# Patient Record
Sex: Male | Born: 1945 | Race: Black or African American | Hispanic: No | Marital: Single | Smoking: Former smoker
Health system: Southern US, Community
[De-identification: ages and names within clinical notes are randomized; demographics above are authoritative.]

## PROBLEM LIST (undated history)

## (undated) DIAGNOSIS — E119 Type 2 diabetes mellitus without complications: Secondary | ICD-10-CM

## (undated) DIAGNOSIS — I10 Essential (primary) hypertension: Secondary | ICD-10-CM

## (undated) DIAGNOSIS — M1990 Unspecified osteoarthritis, unspecified site: Secondary | ICD-10-CM

## (undated) DIAGNOSIS — C61 Malignant neoplasm of prostate: Secondary | ICD-10-CM

## (undated) DIAGNOSIS — K649 Unspecified hemorrhoids: Secondary | ICD-10-CM

## (undated) DIAGNOSIS — E7800 Pure hypercholesterolemia, unspecified: Secondary | ICD-10-CM

## (undated) HISTORY — PX: OTHER SURGICAL HISTORY: SHX169

---

## 1998-11-08 ENCOUNTER — Ambulatory Visit (HOSPITAL_COMMUNITY): Admission: RE | Admit: 1998-11-08 | Discharge: 1998-11-08 | Payer: Self-pay | Admitting: *Deleted

## 1999-07-18 ENCOUNTER — Encounter: Payer: Self-pay | Admitting: Endocrinology

## 1999-07-18 ENCOUNTER — Encounter: Admission: RE | Admit: 1999-07-18 | Discharge: 1999-07-18 | Payer: Self-pay | Admitting: Endocrinology

## 2001-09-10 ENCOUNTER — Ambulatory Visit (HOSPITAL_COMMUNITY): Admission: RE | Admit: 2001-09-10 | Discharge: 2001-09-10 | Payer: Self-pay | Admitting: *Deleted

## 2003-07-18 ENCOUNTER — Ambulatory Visit (HOSPITAL_COMMUNITY): Admission: RE | Admit: 2003-07-18 | Discharge: 2003-07-18 | Payer: Self-pay | Admitting: Orthopedic Surgery

## 2004-08-09 ENCOUNTER — Ambulatory Visit (HOSPITAL_BASED_OUTPATIENT_CLINIC_OR_DEPARTMENT_OTHER): Admission: RE | Admit: 2004-08-09 | Discharge: 2004-08-09 | Payer: Self-pay | Admitting: Surgery

## 2004-08-09 ENCOUNTER — Ambulatory Visit (HOSPITAL_COMMUNITY): Admission: RE | Admit: 2004-08-09 | Discharge: 2004-08-09 | Payer: Self-pay | Admitting: Surgery

## 2004-10-22 ENCOUNTER — Encounter: Admission: RE | Admit: 2004-10-22 | Discharge: 2005-01-20 | Payer: Self-pay | Admitting: Endocrinology

## 2006-11-28 ENCOUNTER — Ambulatory Visit (HOSPITAL_COMMUNITY): Admission: RE | Admit: 2006-11-28 | Discharge: 2006-11-28 | Payer: Self-pay | Admitting: *Deleted

## 2012-04-22 ENCOUNTER — Other Ambulatory Visit: Payer: Self-pay | Admitting: Urology

## 2012-05-26 ENCOUNTER — Encounter (HOSPITAL_COMMUNITY): Payer: Self-pay | Admitting: Pharmacy Technician

## 2012-05-29 ENCOUNTER — Ambulatory Visit (HOSPITAL_COMMUNITY)
Admission: RE | Admit: 2012-05-29 | Discharge: 2012-05-29 | Disposition: A | Discharge status: Home / Self Care | Payer: Medicare Other | Source: Ambulatory Visit | Attending: Urology | Admitting: Urology

## 2012-05-29 ENCOUNTER — Encounter (HOSPITAL_COMMUNITY)
Admission: RE | Admit: 2012-05-29 | Discharge: 2012-05-29 | Disposition: A | Discharge status: Home / Self Care | Payer: Medicare Other | Source: Ambulatory Visit | Attending: Urology | Admitting: Urology

## 2012-05-29 ENCOUNTER — Encounter (HOSPITAL_COMMUNITY): Payer: Self-pay

## 2012-05-29 ENCOUNTER — Other Ambulatory Visit: Payer: Self-pay

## 2012-05-29 DIAGNOSIS — Z01818 Encounter for other preprocedural examination: Secondary | ICD-10-CM | POA: Insufficient documentation

## 2012-05-29 HISTORY — DX: Unspecified hemorrhoids: K64.9

## 2012-05-29 HISTORY — DX: Unspecified osteoarthritis, unspecified site: M19.90

## 2012-05-29 HISTORY — DX: Type 2 diabetes mellitus without complications: E11.9

## 2012-05-29 HISTORY — DX: Pure hypercholesterolemia, unspecified: E78.00

## 2012-05-29 HISTORY — DX: Essential (primary) hypertension: I10

## 2012-05-29 LAB — CBC
HCT: 40.1 % (ref 39.0–52.0)
RBC: 5.02 MIL/uL (ref 4.22–5.81)
RDW: 14 % (ref 11.5–15.5)
WBC: 6.7 10*3/uL (ref 4.0–10.5)

## 2012-05-29 LAB — BASIC METABOLIC PANEL
Chloride: 98 mEq/L (ref 96–112)
Creatinine, Ser: 0.69 mg/dL (ref 0.50–1.35)
GFR calc Af Amer: >90 mL/min (ref >90)
Potassium: 3.8 mEq/L (ref 3.5–5.1)
Sodium: 134 mEq/L — ABNORMAL LOW (ref 135–145)

## 2012-05-29 LAB — SURGICAL PCR SCREEN
MRSA, PCR: NEGATIVE
Staphylococcus aureus: NEGATIVE

## 2012-05-29 NOTE — Progress Notes (Signed)
05/29/12 1314  OBSTRUCTIVE SLEEP APNEA  Have you ever been diagnosed with sleep apnea through a sleep study? No  Do you snore loudly (loud enough to be heard through closed doors)?  0  Do you often feel tired, fatigued, or sleepy during the daytime? 1  Has anyone observed you stop breathing during your sleep? 0  Do you have, or are you being treated for high blood pressure? 1  BMI more than 35 kg/m2? 1  Age over 65 years old? 1  Gender: 1  Obstructive Sleep Apnea Score 5   Score 4 or greater  Results sent to PCP

## 2012-05-29 NOTE — Patient Instructions (Addendum)
20 Jay Holloway  05/29/2012   Your procedure is scheduled on:  06-04-2012   Report to [facility] at 0830  AM.  Call this number if you have problems the morning of surgery: [phone]   Remember:   Do not eat food after midnight Tuesday night   clear liquids all day Wednesday 06-03-2012. No liquids after midnight Wednesday night.  .  Take these medicines the morning of surgery with A SIP OF WATER: crestor   Do not wear jewelry or make up.  Do not wear lotions, powders, or perfumes. You may wear deodorant.    Do not bring valuables to the hospital.  Contacts, dentures or bridgework may not be worn into surgery.  Leave suitcase in the car. After surgery it may be brought to your room.  For patients admitted to the hospital, checkout time is 11:00 AM the day of discharge                             Patients discharged the day of surgery will not be allowed to drive home. If going home same day of surgery, you must have someone stay with you the first 24 hours at home and arrange for some one to drive you home from hospital.    Special Instructions: See [facility] Preparing for Surgery instruction sheet. Women do not shave legs or underarms for 12 hours before showers. Men may shave face morning of surgery.    Please read over the following fact sheets that you were given: MRSA Information, incentive spirometer fact sheet, clear liquid fact sheet  Cain Sieve [facility] pre op nurse phone number [phone], call if needed

## 2012-06-01 NOTE — Progress Notes (Signed)
bmet results faxed to dr borden, fax confirnation received and placed on pt chart.

## 2012-06-03 NOTE — H&P (Signed)
Chief Complaint  Prostate Cancer     History of Present Illness  Jay Holloway is a 66 year old with a long history of an elevated PSA dating back to 83 when he reportedly underwent a prostate biopsy which indicated evidence of BPH and inflammation but no malignancy. His PSA increased to 4.6 in June 2013 prompting further evaluation and a prostate biopsy on 03/31/12 which confirmed Gleason 4+3=7 adenocarcinoma in 1 out of 12 biopsy cores.  He has no family history of prostate cancer.  His comorbidities include a history of diabetes, hypertension, and morbid obesity (315 lbs).  TNM stage: cT1c Nx Mx PSA: 4.6 Gleason score: 4+3=7 Biopsy (03/31/12): 1/12 cores positive -- R lateral base (70%) Prostate volume: 33.3 cc  Nomogram: OC disease: 75% EPE: 21% SVI: 3% LNI: 2.2% PFS (surgery): 94%, 91%  Urinary function: He has minimal voiding symptoms. IPSS is 4.  Erectile function: He has erectile dysfunction. SHIM score is 20. However, he states that he can only get erections if he waits approximately a week in between erections and with significant effort. He currently is not using PDE 5 inhibitors.   Past Medical History Problems  1. History of  Arthritis V13.4 2. History of  Diabetes Mellitus 250.00 3. History of  Hypercholesterolemia 272.0 4. History of  Hypertension 401.9 5. History of  Nephrolithiasis V13.01  Surgical History Problems  1. History of  Ankle Surgery Right 2. History of  Biopsy Of The Prostate Needle  Current Meds 1. Crestor 10 MG Oral Tablet; Therapy: 28Jun2012 to 2. Exforge 10-320 MG Oral Tablet; Therapy: 28Jun2012 to 3. Indomethacin ER 75 MG Oral Capsule Extended Release; Therapy: 28Jun2012 to 4. MetFORMIN HCl 500 MG Oral Tablet; Therapy: (Recorded:01Aug2013) to  Allergies Medication  1. No Known Drug Allergies  Family History Problems  1. Fraternal history of  Renal Failure Denied  2. Family history of  Prostate Cancer  Social History Problems     Alcohol Use 1-2/wk   Never A Smoker  Review of Systems Constitutional, skin, eye, otolaryngeal, hematologic/lymphatic, cardiovascular, pulmonary, endocrine, musculoskeletal, gastrointestinal, neurological and psychiatric system(s) were reviewed and pertinent findings if present are noted.  Cardiovascular: no chest pain and no leg swelling.  Respiratory: no shortness of breath during exertion.    Vitals  BMI Calculated: 40.43 BSA Calculated: 2.64 Height: 6 ft 2 in Weight: 315 lb    Physical Exam Constitutional: Well nourished and well developed . No acute distress.  ENT:. The ears and nose are normal in appearance.  Neck: The appearance of the neck is normal and no neck mass is present.  Pulmonary: No respiratory distress, normal respiratory rhythm and effort and clear bilateral breath sounds.  Cardiovascular: Heart rate and rhythm are normal . No peripheral edema.  Abdomen: The abdomen is obese. The abdomen is soft and nontender. No masses are palpated. No CVA tenderness. No hernias are palpable. No hepatosplenomegaly noted. He is morbidly obese and does have a pannus.  Rectal: Rectal exam demonstrates normal sphincter tone, no tenderness and no masses. Prostate size is estimated to be 40 g. The prostate has no nodularity and is not tender. The left seminal vesicle is nonpalpable. The right seminal vesicle is nonpalpable. The perineum is normal on inspection.  Lymphatics: The femoral and inguinal nodes are not enlarged or tender.  Skin: Normal skin turgor, no visible rash and no visible skin lesions.  Neuro/Psych:. Mood and affect are appropriate.    Results/Data Urine [Data Includes: Last 1 Day]   24Sep2013  COLOR YELLOW   APPEARANCE CLEAR   SPECIFIC GRAVITY 1.025   pH 6.0   GLUCOSE > 1000 mg/dL  BILIRUBIN NEG   KETONE > 80 mg/dL  BLOOD LARGE   PROTEIN 30 mg/dL  UROBILINOGEN 0.2 mg/dL  NITRITE NEG   LEUKOCYTE ESTERASE NEG   SQUAMOUS EPITHELIAL/HPF RARE   WBC 0-2 WBC/hpf   RBC 11-20 RBC/hpf  BACTERIA RARE   CRYSTALS NONE SEEN   CASTS NONE SEEN     I reviewed his medical records, PSA results, and pathology report. Findings are as dictated above.   Assessment Assessed  1. Adenocarcinoma Of The Prostate Gland 185    Discussion/Summary  1. Prostate cancer:   We discussed his prostate cancer and treatment options that were available including both surgical treatment and radiation treatment options in detail. I did recommend proceeding with treatment with curative intent. I also offered him a radiation oncology consultation which he declines. He understands the increased risks associated with surgery including the possibility that he may require open surgical conversion based on his abdominal girth and potential inability to ventilate him well in the Trendelenburg position. He also understands the risks associated with wound healing/infection considering his history of diabetes and the potential increased risk of urinary incontinence considering his abdominal size. Despite these increased risks, and he adamantly wishes to proceed with surgical treatment of his prostate cancer.  He will be scheduled for a bilateral nerve sparing robotic prostatectomy and pelvic lymphadenectomy. He understands the increased risk of open surgical conversion in the potential implications of this.

## 2012-06-04 ENCOUNTER — Encounter (HOSPITAL_COMMUNITY): Payer: Self-pay | Admitting: Anesthesiology

## 2012-06-04 ENCOUNTER — Observation Stay (HOSPITAL_COMMUNITY)
Admission: RE | Admit: 2012-06-04 | Discharge: 2012-06-05 | Disposition: A | Discharge status: Home / Self Care | Payer: Medicare Other | Source: Ambulatory Visit | Attending: Urology | Admitting: Urology

## 2012-06-04 ENCOUNTER — Encounter (HOSPITAL_COMMUNITY)
Admission: RE | Disposition: A | Discharge status: Home / Self Care | Payer: Self-pay | Source: Ambulatory Visit | Attending: Urology

## 2012-06-04 ENCOUNTER — Ambulatory Visit (HOSPITAL_COMMUNITY): Payer: Medicare Other | Admitting: Anesthesiology

## 2012-06-04 ENCOUNTER — Encounter (HOSPITAL_COMMUNITY): Payer: Self-pay | Admitting: *Deleted

## 2012-06-04 DIAGNOSIS — I10 Essential (primary) hypertension: Secondary | ICD-10-CM | POA: Insufficient documentation

## 2012-06-04 DIAGNOSIS — Z79899 Other long term (current) drug therapy: Secondary | ICD-10-CM | POA: Insufficient documentation

## 2012-06-04 DIAGNOSIS — E119 Type 2 diabetes mellitus without complications: Secondary | ICD-10-CM | POA: Insufficient documentation

## 2012-06-04 DIAGNOSIS — C61 Malignant neoplasm of prostate: Principal | ICD-10-CM | POA: Insufficient documentation

## 2012-06-04 DIAGNOSIS — Z23 Encounter for immunization: Secondary | ICD-10-CM | POA: Insufficient documentation

## 2012-06-04 DIAGNOSIS — E7800 Pure hypercholesterolemia, unspecified: Secondary | ICD-10-CM | POA: Insufficient documentation

## 2012-06-04 HISTORY — PX: LYMPHADENECTOMY: SHX5960

## 2012-06-04 HISTORY — PX: ROBOT ASSISTED LAPAROSCOPIC RADICAL PROSTATECTOMY: SHX5141

## 2012-06-04 LAB — ABO/RH: ABO/RH(D): O POS

## 2012-06-04 LAB — TYPE AND SCREEN: Antibody Screen: NEGATIVE

## 2012-06-04 LAB — GLUCOSE, CAPILLARY
Glucose-Capillary: 260 mg/dL — ABNORMAL HIGH (ref 70–99)
Glucose-Capillary: 284 mg/dL — ABNORMAL HIGH (ref 70–99)
Glucose-Capillary: 313 mg/dL — ABNORMAL HIGH (ref 70–99)

## 2012-06-04 LAB — HEMOGLOBIN AND HEMATOCRIT, BLOOD: HCT: 36.4 % — ABNORMAL LOW (ref 39.0–52.0)

## 2012-06-04 SURGERY — ROBOTIC ASSISTED LAPAROSCOPIC RADICAL PROSTATECTOMY LEVEL 3
Anesthesia: General | Wound class: Clean Contaminated

## 2012-06-04 MED ORDER — HYDROMORPHONE HCL PF 1 MG/ML IJ SOLN
0.2500 mg | INTRAMUSCULAR | Status: DC | PRN
  Administered 2012-06-04 (×2): 0.25 mg via INTRAVENOUS

## 2012-06-04 MED ORDER — KCL IN DEXTROSE-NACL 20-5-0.45 MEQ/L-%-% IV SOLN
INTRAVENOUS | Status: DC
  Administered 2012-06-04 – 2012-06-05 (×2): via INTRAVENOUS
  Filled 2012-06-04 (×4): qty 1000

## 2012-06-04 MED ORDER — MIDAZOLAM HCL 5 MG/5ML IJ SOLN
INTRAMUSCULAR | Status: DC | PRN
  Administered 2012-06-04: 2 mg via INTRAVENOUS

## 2012-06-04 MED ORDER — STERILE WATER FOR IRRIGATION IR SOLN
Status: DC | PRN
  Administered 2012-06-04: 3000 mL

## 2012-06-04 MED ORDER — ACETAMINOPHEN 10 MG/ML IV SOLN
INTRAVENOUS | Status: AC
  Filled 2012-06-04: qty 100

## 2012-06-04 MED ORDER — INFLUENZA VIRUS VACC SPLIT PF IM SUSP
0.5000 mL | INTRAMUSCULAR | Status: AC
  Administered 2012-06-05: 0.5 mL via INTRAMUSCULAR
  Filled 2012-06-04: qty 0.5

## 2012-06-04 MED ORDER — PROPOFOL 10 MG/ML IV BOLUS
INTRAVENOUS | Status: DC | PRN
  Administered 2012-06-04: 280 mg via INTRAVENOUS

## 2012-06-04 MED ORDER — CEFAZOLIN SODIUM-DEXTROSE 2-3 GM-% IV SOLR
INTRAVENOUS | Status: AC
  Filled 2012-06-04: qty 50

## 2012-06-04 MED ORDER — BUPIVACAINE-EPINEPHRINE 0.25% -1:200000 IJ SOLN
INTRAMUSCULAR | Status: DC | PRN
  Administered 2012-06-04: 30 mL

## 2012-06-04 MED ORDER — IRBESARTAN 300 MG PO TABS
300.0000 mg | ORAL_TABLET | Freq: Every day | ORAL | Status: DC
  Filled 2012-06-04: qty 1

## 2012-06-04 MED ORDER — NEOSTIGMINE METHYLSULFATE 1 MG/ML IJ SOLN
INTRAMUSCULAR | Status: DC | PRN
  Administered 2012-06-04: 5 mg via INTRAVENOUS

## 2012-06-04 MED ORDER — LACTATED RINGERS IV SOLN
INTRAVENOUS | Status: DC | PRN
  Administered 2012-06-04: 12:00:00

## 2012-06-04 MED ORDER — GLYCOPYRROLATE 0.2 MG/ML IJ SOLN
INTRAMUSCULAR | Status: DC | PRN
  Administered 2012-06-04: 1 mg via INTRAVENOUS

## 2012-06-04 MED ORDER — SODIUM CHLORIDE 0.9 % IV BOLUS (SEPSIS)
500.0000 mL | Freq: Once | INTRAVENOUS | Status: AC
  Administered 2012-06-04: 500 mL via INTRAVENOUS

## 2012-06-04 MED ORDER — AMLODIPINE BESYLATE-VALSARTAN 10-320 MG PO TABS: 1.0000 | ORAL_TABLET | Freq: Every morning | ORAL | Status: DC

## 2012-06-04 MED ORDER — INDIGOTINDISULFONATE SODIUM 8 MG/ML IJ SOLN
INTRAMUSCULAR | Status: AC
  Filled 2012-06-04: qty 10

## 2012-06-04 MED ORDER — HEPARIN SODIUM (PORCINE) 1000 UNIT/ML IJ SOLN
INTRAMUSCULAR | Status: AC
  Filled 2012-06-04: qty 1

## 2012-06-04 MED ORDER — MORPHINE SULFATE 2 MG/ML IJ SOLN
2.0000 mg | INTRAMUSCULAR | Status: DC | PRN
  Administered 2012-06-04 – 2012-06-05 (×4): 2 mg via INTRAVENOUS
  Filled 2012-06-04 (×4): qty 1

## 2012-06-04 MED ORDER — BUPIVACAINE-EPINEPHRINE PF 0.25-1:200000 % IJ SOLN
INTRAMUSCULAR | Status: AC
  Filled 2012-06-04: qty 30

## 2012-06-04 MED ORDER — PHENYLEPHRINE HCL 10 MG/ML IJ SOLN
INTRAMUSCULAR | Status: DC | PRN
  Administered 2012-06-04 (×3): 100 ug via INTRAVENOUS

## 2012-06-04 MED ORDER — ACETAMINOPHEN 10 MG/ML IV SOLN
INTRAVENOUS | Status: DC | PRN
  Administered 2012-06-04: 1000 mg via INTRAVENOUS

## 2012-06-04 MED ORDER — MENTHOL 3 MG MT LOZG
1.0000 | LOZENGE | OROMUCOSAL | Status: DC | PRN
  Filled 2012-06-04: qty 9

## 2012-06-04 MED ORDER — KETOROLAC TROMETHAMINE 15 MG/ML IJ SOLN
15.0000 mg | Freq: Four times a day (QID) | INTRAMUSCULAR | Status: DC
  Administered 2012-06-04 – 2012-06-05 (×3): 15 mg via INTRAVENOUS
  Filled 2012-06-04 (×6): qty 1

## 2012-06-04 MED ORDER — AMLODIPINE BESYLATE 10 MG PO TABS
10.0000 mg | ORAL_TABLET | Freq: Every day | ORAL | Status: DC
  Filled 2012-06-04: qty 1

## 2012-06-04 MED ORDER — SODIUM CHLORIDE 0.9 % IV SOLN
10.0000 mg | INTRAVENOUS | Status: DC | PRN
  Administered 2012-06-04: 50 ug/min via INTRAVENOUS

## 2012-06-04 MED ORDER — ROCURONIUM BROMIDE 100 MG/10ML IV SOLN
INTRAVENOUS | Status: DC | PRN
  Administered 2012-06-04: 60 mg via INTRAVENOUS
  Administered 2012-06-04: 10 mg via INTRAVENOUS
  Administered 2012-06-04: 20 mg via INTRAVENOUS
  Administered 2012-06-04 (×3): 10 mg via INTRAVENOUS

## 2012-06-04 MED ORDER — EPHEDRINE SULFATE 50 MG/ML IJ SOLN
INTRAMUSCULAR | Status: DC | PRN
  Administered 2012-06-04: 10 mg via INTRAVENOUS
  Administered 2012-06-04: 5 mg via INTRAVENOUS
  Administered 2012-06-04: 10 mg via INTRAVENOUS

## 2012-06-04 MED ORDER — INDIGOTINDISULFONATE SODIUM 8 MG/ML IJ SOLN
INTRAMUSCULAR | Status: DC | PRN
  Administered 2012-06-04 (×2): 5 mL via INTRAVENOUS

## 2012-06-04 MED ORDER — ATORVASTATIN CALCIUM 20 MG PO TABS
20.0000 mg | ORAL_TABLET | Freq: Every day | ORAL | Status: DC
Start: 2012-06-04 — End: 2012-06-05
  Administered 2012-06-04: 20 mg via ORAL
  Filled 2012-06-04 (×2): qty 1

## 2012-06-04 MED ORDER — LIDOCAINE HCL (CARDIAC) 20 MG/ML IV SOLN
INTRAVENOUS | Status: DC | PRN
  Administered 2012-06-04: 100 mg via INTRAVENOUS

## 2012-06-04 MED ORDER — INSULIN ASPART 100 UNIT/ML ~~LOC~~ SOLN
0.0000 [IU] | SUBCUTANEOUS | Status: DC
  Administered 2012-06-04: 8 [IU] via SUBCUTANEOUS
  Administered 2012-06-04 – 2012-06-05 (×2): 11 [IU] via SUBCUTANEOUS
  Administered 2012-06-05: 8 [IU] via SUBCUTANEOUS
  Administered 2012-06-05: 3 [IU] via SUBCUTANEOUS

## 2012-06-04 MED ORDER — HYDROMORPHONE HCL PF 1 MG/ML IJ SOLN
INTRAMUSCULAR | Status: AC
  Filled 2012-06-04: qty 1

## 2012-06-04 MED ORDER — DIPHENHYDRAMINE HCL 12.5 MG/5ML PO ELIX: 12.5000 mg | ORAL_SOLUTION | Freq: Four times a day (QID) | ORAL | Status: DC | PRN

## 2012-06-04 MED ORDER — LACTATED RINGERS IV SOLN
INTRAVENOUS | Status: DC
  Administered 2012-06-04: 1000 mL via INTRAVENOUS
  Administered 2012-06-04 (×2): via INTRAVENOUS

## 2012-06-04 MED ORDER — HYDROCODONE-ACETAMINOPHEN 5-325 MG PO TABS: 1.0000 | ORAL_TABLET | Freq: Four times a day (QID) | ORAL | Status: DC | PRN

## 2012-06-04 MED ORDER — FENTANYL CITRATE 0.05 MG/ML IJ SOLN
INTRAMUSCULAR | Status: DC | PRN
  Administered 2012-06-04: 50 ug via INTRAVENOUS
  Administered 2012-06-04: 100 ug via INTRAVENOUS
  Administered 2012-06-04 (×2): 50 ug via INTRAVENOUS

## 2012-06-04 MED ORDER — ACETAMINOPHEN 325 MG PO TABS: 650.0000 mg | ORAL_TABLET | ORAL | Status: DC | PRN

## 2012-06-04 MED ORDER — LACTATED RINGERS IV SOLN: INTRAVENOUS | Status: DC

## 2012-06-04 MED ORDER — CEFAZOLIN SODIUM 1-5 GM-% IV SOLN
INTRAVENOUS | Status: AC
  Filled 2012-06-04: qty 50

## 2012-06-04 MED ORDER — CIPROFLOXACIN HCL 500 MG PO TABS: 500.0000 mg | ORAL_TABLET | Freq: Two times a day (BID) | ORAL | Status: DC

## 2012-06-04 MED ORDER — CEFAZOLIN SODIUM 1-5 GM-% IV SOLN
1.0000 g | Freq: Three times a day (TID) | INTRAVENOUS | Status: AC
  Administered 2012-06-04 – 2012-06-05 (×2): 1 g via INTRAVENOUS
  Filled 2012-06-04 (×2): qty 50

## 2012-06-04 MED ORDER — SODIUM CHLORIDE 0.9 % IV BOLUS (SEPSIS)
1000.0000 mL | Freq: Once | INTRAVENOUS | Status: AC
  Administered 2012-06-04: 1000 mL via INTRAVENOUS

## 2012-06-04 MED ORDER — AMLODIPINE BESYLATE 10 MG PO TABS
10.0000 mg | ORAL_TABLET | Freq: Once | ORAL | Status: AC
  Administered 2012-06-04: 10 mg via ORAL
  Filled 2012-06-04: qty 1

## 2012-06-04 MED ORDER — SODIUM CHLORIDE 0.9 % IR SOLN
Status: DC | PRN
  Administered 2012-06-04: 300 mL via INTRAVESICAL

## 2012-06-04 MED ORDER — DIPHENHYDRAMINE HCL 50 MG/ML IJ SOLN: 12.5000 mg | Freq: Four times a day (QID) | INTRAMUSCULAR | Status: DC | PRN

## 2012-06-04 MED ORDER — ONDANSETRON HCL 4 MG/2ML IJ SOLN
INTRAMUSCULAR | Status: DC | PRN
  Administered 2012-06-04: 4 mg via INTRAVENOUS

## 2012-06-04 MED ORDER — DOCUSATE SODIUM 100 MG PO CAPS
100.0000 mg | ORAL_CAPSULE | Freq: Two times a day (BID) | ORAL | Status: DC
  Administered 2012-06-04 – 2012-06-05 (×2): 100 mg via ORAL
  Filled 2012-06-04 (×3): qty 1

## 2012-06-04 MED ORDER — DEXTROSE 5 % IV SOLN
3.0000 g | INTRAVENOUS | Status: AC
  Administered 2012-06-04: 3 g via INTRAVENOUS

## 2012-06-04 SURGICAL SUPPLY — 39 items
CANISTER SUCTION 2500CC (MISCELLANEOUS) ×3 IMPLANT
CATH FOLEY 2WAY SLVR 18FR 30CC (CATHETERS) ×3 IMPLANT
CATH ROBINSON RED A/P 8FR (CATHETERS) ×3 IMPLANT
CATH TIEMANN FOLEY 18FR 5CC (CATHETERS) ×3 IMPLANT
CHLORAPREP W/TINT 26ML (MISCELLANEOUS) ×3 IMPLANT
CLIP LIGATING HEM O LOK PURPLE (MISCELLANEOUS) ×5 IMPLANT
CLOTH BEACON ORANGE TIMEOUT ST (SAFETY) ×3 IMPLANT
COVER SURGICAL LIGHT HANDLE (MISCELLANEOUS) ×3 IMPLANT
COVER TIP SHEARS 8 DVNC (MISCELLANEOUS) ×2 IMPLANT
COVER TIP SHEARS 8MM DA VINCI (MISCELLANEOUS) ×1
CUTTER ECHEON FLEX ENDO 45 340 (ENDOMECHANICALS) ×3 IMPLANT
DECANTER SPIKE VIAL GLASS SM (MISCELLANEOUS) ×3 IMPLANT
DRAPE SURG IRRIG POUCH 19X23 (DRAPES) ×3 IMPLANT
DRSG TEGADERM 2-3/8X2-3/4 SM (GAUZE/BANDAGES/DRESSINGS) ×15 IMPLANT
DRSG TEGADERM 4X4.75 (GAUZE/BANDAGES/DRESSINGS) ×3 IMPLANT
DRSG TEGADERM 6X8 (GAUZE/BANDAGES/DRESSINGS) ×6 IMPLANT
ELECT REM PT RETURN 9FT ADLT (ELECTROSURGICAL) ×3
ELECTRODE REM PT RTRN 9FT ADLT (ELECTROSURGICAL) ×2 IMPLANT
GLOVE BIO SURGEON STRL SZ 6.5 (GLOVE) ×3 IMPLANT
GLOVE BIOGEL M STRL SZ7.5 (GLOVE) ×6 IMPLANT
GOWN STRL NON-REIN LRG LVL3 (GOWN DISPOSABLE) ×21 IMPLANT
HOLDER FOLEY CATH W/STRAP (MISCELLANEOUS) ×3 IMPLANT
IV LACTATED RINGERS 1000ML (IV SOLUTION) ×2 IMPLANT
KIT ACCESSORY DA VINCI DISP (KITS) ×1
KIT ACCESSORY DVNC DISP (KITS) ×2 IMPLANT
NDL SAFETY ECLIPSE 18X1.5 (NEEDLE) ×2 IMPLANT
NEEDLE HYPO 18GX1.5 SHARP (NEEDLE) ×3
PACK ROBOT UROLOGY CUSTOM (CUSTOM PROCEDURE TRAY) ×3 IMPLANT
RELOAD GREEN ECHELON 45 (STAPLE) ×3 IMPLANT
SET TUBE IRRIG SUCTION NO TIP (IRRIGATION / IRRIGATOR) ×4 IMPLANT
SOLUTION ELECTROLUBE (MISCELLANEOUS) ×3 IMPLANT
SPONGE GAUZE 4X4 12PLY (GAUZE/BANDAGES/DRESSINGS) ×3 IMPLANT
SUT ETHILON 3 0 PS 1 (SUTURE) ×3 IMPLANT
SUT VICRYL 0 UR6 27IN ABS (SUTURE) ×6 IMPLANT
SYR 27GX1/2 1ML LL SAFETY (SYRINGE) ×3 IMPLANT
TOWEL OR 17X26 10 PK STRL BLUE (TOWEL DISPOSABLE) ×3 IMPLANT
TOWEL OR NON WOVEN STRL DISP B (DISPOSABLE) ×3 IMPLANT
TROCAR 12M 150ML BLUNT (TROCAR) ×1 IMPLANT
WATER STERILE IRR 1500ML POUR (IV SOLUTION) ×5 IMPLANT

## 2012-06-04 NOTE — Transfer of Care (Addendum)
Immediate Anesthesia Transfer of Care Note  Patient: Jay Holloway  Procedure(s) Performed: Procedure(s) (LRB) with comments: ROBOTIC ASSISTED LAPAROSCOPIC RADICAL PROSTATECTOMY LEVEL 3 (N/A) LYMPHADENECTOMY (Bilateral)  Patient Location: PACU  Anesthesia Type:General  Level of Consciousness: pateint uncooperative and confused  Airway & Oxygen Therapy: Patient connected to face mask oxygen  Post-op Assessment: Report given to PACU RN  Post vital signs: Reviewed and stable  Complications: upon arrival, breatihing shallow.  resp assisted with ambu bag.  NPA place in L nare.  O2Sat 91% on arrival.  Increases to 98%  Sleepy but arrousable.

## 2012-06-04 NOTE — Anesthesia Preprocedure Evaluation (Signed)
Anesthesia Evaluation  Patient identified by MRN, date of birth, ID band Patient awake    Reviewed: Allergy & Precautions, H&P , NPO status , Patient's Chart, lab work & pertinent test results  Airway Mallampati: II TM Distance: >3 FB Neck ROM: full    Dental No notable dental hx. (+) Missing and Dental Advisory Given,    Pulmonary neg pulmonary ROS,  breath sounds clear to auscultation  Pulmonary exam normal       Cardiovascular Exercise Tolerance: Good hypertension, Pt. on medications Rhythm:regular Rate:Normal  Bifascicular block   Neuro/Psych negative neurological ROS  negative psych ROS   GI/Hepatic negative GI ROS, Neg liver ROS,   Endo/Other  diabetes, Well Controlled, Type 2, Oral Hypoglycemic Agents  Renal/GU negative Renal ROS  negative genitourinary   Musculoskeletal   Abdominal   Peds  Hematology negative hematology ROS (+)   Anesthesia Other Findings   Reproductive/Obstetrics negative OB ROS                           Anesthesia Physical Anesthesia Plan  ASA: II  Anesthesia Plan: General   Post-op Pain Management:    Induction:   Airway Management Planned:   Additional Equipment:   Intra-op Plan:   Post-operative Plan:   Informed Consent: I have reviewed the patients History and Physical, chart, labs and discussed the procedure including the risks, benefits and alternatives for the proposed anesthesia with the patient or authorized representative who has indicated his/her understanding and acceptance.   Dental Advisory Given  Plan Discussed with: CRNA  Anesthesia Plan Comments:         Anesthesia Quick Evaluation

## 2012-06-04 NOTE — Progress Notes (Signed)
Patient ID: Jay Holloway, male   DOB: 07/14/1946, 66 y.o.   MRN: [id number]  Post-op note  Subjective: The patient is doing well.  No complaints.  BP was slightly low but stable in PACU.  Now normal.  He is hemodynamically stable otherwise.  Objective: Vital signs in last 24 hours: Temp:  [97.3 F (36.3 C)-98.3 F (36.8 C)] 97.3 F (36.3 C) (11/07 1627) Pulse Rate:  [88-115] 94  (11/07 1627) Resp:  [13-22] 16  (11/07 1627) BP: (89-136)/(43-97) 96/47 mmHg (11/07 1627) SpO2:  [88 %-100 %] 98 % (11/07 1627) Weight:  [146.512 kg (323 lb)] 146.512 kg (323 lb) (11/07 1627)  Intake/Output from previous day:   Intake/Output this shift: Total I/O In: 3900 [I.V.:2400; IV Piggyback:1500] Out: 560 [Urine:200; Drains:60; Blood:300]  Physical Exam:  General: Alert and oriented. Abdomen: Soft, Nondistended. Incisions: Clean and dry. Urine is clear.  Lab Results:  Basename 06/04/12 1421  HGB 12.0*  HCT 36.4*    Assessment/Plan: POD#0   1) Continue to monitor 2) On telemetry due to BP being borderline low.   Rolly Salter, Montez Hageman. MD   LOS: 0 days   Rowena Moilanen,LES 06/04/2012, 5:26 PM

## 2012-06-04 NOTE — Op Note (Signed)
Preoperative diagnosis: Clinically localized adenocarcinoma of the prostate (clinical stage cT1c Nx Mx)  Postoperative diagnosis: Clinically localized adenocarcinoma of the prostate (clinical stage cT1c Nx Mx)  Procedure:  1. Robotic assisted laparoscopic radical prostatectomy (bilateral nerve sparing) 2. Bilateral robotic assisted laparoscopic pelvic lymphadenectomy  Surgeon: Moody Bruins. M.D.  Assistant: Pecola Leisure, PA-C  Anesthesia: General  Complications: None  EBL: 300 mL  IVF:  2000 mL crystalloid  Specimens: 1. Prostate and seminal vesicles 2. Right pelvic lymph nodes 3. Left pelvic lymph nodes  Disposition of specimens: Pathology  Drains: 1. 20 Fr coude catheter 2. # 19 Blake pelvic drain  Indication: Jay Holloway is a 66 y.o. year old patient with clinically localized prostate cancer.  After a thorough review of the management options for treatment of prostate cancer, he elected to proceed with surgical therapy and the above procedure(s).  We have discussed the potential benefits and risks of the procedure, side effects of the proposed treatment, the likelihood of the patient achieving the goals of the procedure, and any potential problems that might occur during the procedure or recuperation. Informed consent has been obtained.  Description of procedure:  The patient was taken to the operating room and a general anesthetic was administered. He was given preoperative antibiotics, placed in the dorsal lithotomy position, and prepped and draped in the usual sterile fashion. Next a preoperative timeout was performed. A urethral catheter was placed into the bladder and a site was selected near the umbilicus for placement of the camera port. This was placed using a standard open Hassan technique which allowed entry into the peritoneal cavity under direct vision and without difficulty. A 12 mm port was placed and a pneumoperitoneum established. It was ensured  that the patient was able to be ventilated. The camera was then used to inspect the abdomen and there was no evidence of any intra-abdominal injuries or other abnormalities. The remaining abdominal ports were then placed. 8 mm robotic ports were placed in the right lower quadrant, left lower quadrant, and far left lateral abdominal wall. A 5 mm port was placed in the right upper quadrant and a 12 mm port was placed in the right lateral abdominal wall for laparoscopic assistance. Extra long ports were used.  All ports were placed under direct vision without difficulty. The surgical cart was then docked.   Utilizing the cautery scissors, the bladder was reflected posteriorly allowing entry into the space of Retzius and identification of the endopelvic fascia and prostate. The periprostatic fat was then removed from the prostate allowing full exposure of the endopelvic fascia. The endopelvic fascia was then incised from the apex back to the base of the prostate bilaterally and the underlying levator muscle fibers were swept laterally off the prostate thereby isolating the dorsal venous complex. The dorsal vein was then stapled and divided with a 45 mm Flex Echelon stapler. Attention then turned to the bladder neck which was divided anteriorly thereby allowing entry into the bladder and exposure of the urethral catheter. The catheter balloon was deflated and the catheter was brought into the operative field and used to retract the prostate anteriorly. The posterior bladder neck was then examined and was divided allowing further dissection between the bladder and prostate posteriorly until the vasa deferentia and seminal vessels were identified. The vasa deferentia were isolated, divided, and lifted anteriorly. The seminal vesicles were dissected down to their tips with care to control the seminal vascular arterial blood supply. These structures were then lifted  anteriorly and the space between Denonvillier's fascia  and the anterior rectum was developed with a combination of sharp and blunt dissection. This isolated the vascular pedicles of the prostate.  The lateral prostatic fascia was then sharply incised allowing release of the neurovascular bundles bilaterally. The vascular pedicles of the prostate were then ligated with Weck clips between the prostate and neurovascular bundles and divided with sharp cold scissor dissection resulting in neurovascular bundle preservation. The neurovascular bundles were then separated off the apex of the prostate and urethra bilaterally.  The urethra was then sharply transected allowing the prostate specimen to be disarticulated. The pelvis was copiously irrigated and hemostasis was ensured. There was no evidence for rectal injury.  Attention then turned to the right pelvic sidewall. The fibrofatty tissue between the external iliac vein, confluence of the iliac vessels, hypogastric artery, and Cooper's ligament was dissected free from the pelvic sidewall with care to preserve the obturator nerve. Weck clips were used for lymphostasis and hemostasis. An identical procedure was performed on the contralateral side and the lymphatic packets were removed for permanent pathologic analysis.  Attention then turned to the urethral anastomosis. A 2-0 Vicryl slip knot was placed between Denonvillier's fascia, the posterior bladder neck, and the posterior urethra to reapproximate these structures. A double-armed 3-0 Monocryl suture was then used to perform a 360 running tension-free anastomosis between the bladder neck and urethra. A new urethral catheter was then placed into the bladder and irrigated. There were no blood clots within the bladder and the anastomosis appeared to be watertight. A #19 Blake drain was then brought through the left lateral 8 mm port site and positioned appropriately within the pelvis. It was secured to the skin with a nylon suture. The surgical cart was then  undocked. The right lateral 12 mm port site was closed at the fascial level with a 0 Vicryl suture placed laparoscopically. All remaining ports were then removed under direct vision. The prostate specimen was removed intact within the Endopouch retrieval bag via the periumbilical camera port site. This fascial opening was closed with two running 0 Vicryl sutures. 0.25% Marcaine was then injected into all port sites and all incisions were reapproximated at the skin level with staples. Sterile dressings were applied. The patient appeared to tolerate the procedure well and without complications. The patient was able to be extubated and transferred to the recovery unit in satisfactory condition.   Moody Bruins MD

## 2012-06-04 NOTE — Anesthesia Postprocedure Evaluation (Signed)
  Anesthesia Post-op Note  Patient: Multimedia programmer  Procedure(s) Performed: Procedure(s) (LRB): ROBOTIC ASSISTED LAPAROSCOPIC RADICAL PROSTATECTOMY LEVEL 3 (N/A) LYMPHADENECTOMY (Bilateral)  Patient Location: PACU  Anesthesia Type: General  Level of Consciousness: awake and alert   Airway and Oxygen Therapy: Patient Spontanous Breathing  Post-op Pain: mild  Post-op Assessment: Post-op Vital signs reviewed, Patient's Cardiovascular Status Stable, Respiratory Function Stable, Patent Airway and No signs of Nausea or vomiting  Post-op Vital Signs: stable  Complications: No apparent anesthesia complications

## 2012-06-04 NOTE — Anesthesia Procedure Notes (Signed)
Procedure Name: Intubation Date/Time: 06/04/2012 10:37 AM Performed by: Maris Berger T Pre-anesthesia Checklist: Patient identified, Emergency Drugs available, Suction available and Patient being monitored Patient Re-evaluated:Patient Re-evaluated prior to inductionOxygen Delivery Method: Circle System Utilized Preoxygenation: Pre-oxygenation with 100% oxygen Intubation Type: IV induction Ventilation: Mask ventilation without difficulty Laryngoscope Size: Mac and 4 Grade View: Grade II Tube type: Oral Tube size: 8.0 mm Number of attempts: 1 Airway Equipment and Method: stylet and oral airway Placement Confirmation: ETT inserted through vocal cords under direct vision,  positive ETCO2 and breath sounds checked- equal and bilateral Secured at: 25 cm Tube secured with: Tape Dental Injury: Teeth and Oropharynx as per pre-operative assessment

## 2012-06-05 ENCOUNTER — Encounter (HOSPITAL_COMMUNITY): Payer: Self-pay | Admitting: Urology

## 2012-06-05 LAB — GLUCOSE, CAPILLARY
Glucose-Capillary: 197 mg/dL — ABNORMAL HIGH (ref 70–99)
Glucose-Capillary: 263 mg/dL — ABNORMAL HIGH (ref 70–99)
Glucose-Capillary: 332 mg/dL — ABNORMAL HIGH (ref 70–99)

## 2012-06-05 MED ORDER — BISACODYL 10 MG RE SUPP
10.0000 mg | Freq: Once | RECTAL | Status: AC
  Administered 2012-06-05: 10 mg via RECTAL
  Filled 2012-06-05: qty 1

## 2012-06-05 MED ORDER — HYDROCODONE-ACETAMINOPHEN 5-325 MG PO TABS: 1.0000 | ORAL_TABLET | Freq: Four times a day (QID) | ORAL | Status: DC | PRN

## 2012-06-05 NOTE — Care Management Note (Addendum)
    Page 1 of 1   06/05/2012     3:13:04 PM   CARE MANAGEMENT NOTE 06/05/2012  Patient:  Jay Holloway, Jay Holloway   Account Number:  [id number]  Date Initiated:  06/05/2012  Documentation initiated by:  Lanier Clam  Subjective/Objective Assessment:   ADMITTED W/PROSTATE CA.     Action/Plan:   FROM HOME ALONE.   Anticipated DC Date:  06/05/2012   Anticipated DC Plan:  HOME/SELF CARE      DC Planning Services  CM consult      Choice offered to / List presented to:             Status of service:  Completed, signed off Medicare Important Message given?   (If response is "NO", the following Medicare IM given date fields will be blank) Date Medicare IM given:   Date Additional Medicare IM given:    Discharge Disposition:  HOME/SELF CARE  Per UR Regulation:  Reviewed for med. necessity/level of care/duration of stay  If discussed at Long Length of Stay Meetings, dates discussed:    Comments:  06/05/12 Treena Cosman RN,BSN NCM [phone] PATIENT INQUIRED ABOUT HOSPITAL BED,& RW.TC DR. BORDEN-NOT MEDICALLY NECESSARY.EXPLAINED TO PATIENT THAT MD RECOMMENDED TO INCREASE AMBULATION,& TO AVOID BED TO IMPROVE ENDURANCE. POD#1 ROBOTIC LAP PROSTATECTOMY

## 2012-06-05 NOTE — Progress Notes (Signed)
Patient ID: Jay Holloway, male   DOB: 01-18-1946, 66 y.o.   MRN: [id number]  1 Holloway Post-Op Subjective: The patient is doing well.  No nausea or vomiting. Pain is adequately controlled. BP has been stable overnight.  Objective: Vital signs in last 24 hours: Temp:  [97.3 F (36.3 C)-98.1 F (36.7 C)] 97.8 F (36.6 C) (11/08 0410) Pulse Rate:  [88-115] 93  (11/08 0410) Resp:  [13-22] 14  (11/08 0410) BP: (89-136)/(43-97) 120/63 mmHg (11/08 0410) SpO2:  [88 %-100 %] 100 % (11/08 0410) Weight:  [146.512 kg (323 lb)] 146.512 kg (323 lb) (11/07 1627)  Intake/Output from previous Holloway: 11/07 0701 - 11/08 0700 In: 5907.5 [I.V.:4357.5; IV Piggyback:1550] Out: 1525 [Urine:1100; Drains:125; Blood:300] Intake/Output this shift:    Physical Exam:  General: Alert and oriented. CV: RRR Lungs: Clear bilaterally. GI: Soft, Nondistended. Incisions: Dressings intact. Urine: Clear Extremities: Nontender, no erythema, no edema.  Lab Results:  Basename 06/05/12 0454 06/04/12 1421  HGB 10.6* 12.0*  HCT 32.1* 36.4*      Assessment/Plan: POD# 1 s/p robotic prostatectomy.  1) SL IVF 2) Ambulate, Incentive spirometry 3) Transition to oral pain medication 4) Dulcolax suppository 5) D/C pelvic drain 6) Plan for likely discharge later today   Moody Bruins. MD   LOS: 1 Holloway   Stayce Delancy,LES 06/05/2012, 8:46 AM

## 2012-06-05 NOTE — Discharge Summary (Signed)
  Date of admission: 06/04/2012  Date of discharge: 06/05/2012  Admission diagnosis: Prostate Cancer  Discharge diagnosis: Prostate Cancer  History and Physical: For full details, please see admission history and physical. Briefly, Jay Holloway is a 66 y.o. gentleman with localized prostate cancer.  After discussing management/treatment options, he elected to proceed with surgical treatment.  Hospital Course: Ji Fila was taken to the operating room on 06/04/2012 and underwent a robotic assisted laparoscopic radical prostatectomy. He tolerated this procedure well and without complications. Postoperatively, he was able to be transferred to a regular hospital room following recovery from anesthesia.  He was able to begin ambulating the night of surgery. He remained hemodynamically stable overnight.  He had excellent urine output with appropriately minimal output from his pelvic drain and his pelvic drain was removed on POD #1.  He was transitioned to oral pain medication, tolerated a clear liquid diet, and had met all discharge criteria and was able to be discharged home later on POD#1.  Laboratory values:  Basename 06/05/12 0454 06/04/12 1421  HGB 10.6* 12.0*  HCT 32.1* 36.4*    Disposition: Home  Discharge instruction: He was instructed to be ambulatory but to refrain from heavy lifting, strenuous activity, or driving. He was instructed on urethral catheter care.  Discharge medications:     Medication List     As of 06/05/2012 11:41 AM    START taking these medications         ciprofloxacin 500 MG tablet   Commonly known as: CIPRO   Take 1 tablet (500 mg total) by mouth 2 (two) times daily. Start day prior to office visit for foley removal      HYDROcodone-acetaminophen 5-325 MG per tablet   Commonly known as: NORCO/VICODIN   Take 1-2 tablets by mouth every 6 (six) hours as needed for pain.      CONTINUE taking these medications         amLODipine-valsartan 10-320 MG per  tablet   Commonly known as: EXFORGE      glimepiride 2 MG tablet   Commonly known as: AMARYL      indomethacin 75 MG CR capsule   Commonly known as: INDOCIN SR      metFORMIN 500 MG tablet   Commonly known as: GLUCOPHAGE      rosuvastatin 10 MG tablet   Commonly known as: CRESTOR      VICTOZA 18 MG/3ML Soln   Generic drug: Liraglutide      STOP taking these medications         multivitamin with minerals Tabs          Where to get your medications    These are the prescriptions that you need to pick up.   You may get these medications from any pharmacy.         ciprofloxacin 500 MG tablet   HYDROcodone-acetaminophen 5-325 MG per tablet            Followup: He will followup in 1 week for catheter removal and to discuss his surgical pathology results.

## 2013-12-22 ENCOUNTER — Encounter (HOSPITAL_COMMUNITY): Payer: Self-pay | Admitting: Emergency Medicine

## 2013-12-22 ENCOUNTER — Emergency Department (HOSPITAL_COMMUNITY)
Admission: EM | Admit: 2013-12-22 | Discharge: 2013-12-22 | Disposition: A | Discharge status: Home / Self Care | Payer: Medicare Other | Source: Home / Self Care | Attending: Family Medicine | Admitting: Family Medicine

## 2013-12-22 DIAGNOSIS — N390 Urinary tract infection, site not specified: Secondary | ICD-10-CM

## 2013-12-22 LAB — POCT URINALYSIS DIP (DEVICE)
GLUCOSE, UA: 100 mg/dL — AB
Ketones, ur: 40 mg/dL — AB
Nitrite: POSITIVE — AB
Protein, ur: >=300 mg/dL — AB
Specific Gravity, Urine: 1.025 (ref 1.005–1.030)
Urobilinogen, UA: 1 mg/dL (ref 0.0–1.0)
pH: 6 (ref 5.0–8.0)

## 2013-12-22 LAB — POCT I-STAT, CHEM 8
BUN: 20 mg/dL (ref 6–23)
CHLORIDE: 94 meq/L — AB (ref 96–112)
Calcium, Ion: 1.17 mmol/L (ref 1.13–1.30)
Creatinine, Ser: 1.3 mg/dL (ref 0.50–1.35)
Glucose, Bld: 297 mg/dL — ABNORMAL HIGH (ref 70–99)
HCT: 46 % (ref 39.0–52.0)
Hemoglobin: 15.6 g/dL (ref 13.0–17.0)
POTASSIUM: 3.4 meq/L — AB (ref 3.7–5.3)
Sodium: 138 mEq/L (ref 137–147)
TCO2: 26 mmol/L (ref 0–100)

## 2013-12-22 MED ORDER — CEPHALEXIN 500 MG PO CAPS: 500.0000 mg | ORAL_CAPSULE | Freq: Four times a day (QID) | ORAL | Status: DC

## 2013-12-22 MED ORDER — TRAMADOL HCL 50 MG PO TABS: 50.0000 mg | ORAL_TABLET | Freq: Four times a day (QID) | ORAL | Status: DC | PRN

## 2013-12-22 NOTE — ED Notes (Signed)
C/o blood in UA past 4 days; reportedly had chills a few days ago. No change in comfort w CVJ percussion

## 2013-12-22 NOTE — ED Provider Notes (Signed)
CSN: [id number]     Arrival date & time 12/22/13  1604 History   First MD Initiated Contact with Patient 12/22/13 1711     Chief Complaint  Patient presents with  . Hematuria   (Consider location/radiation/quality/duration/timing/severity/associated sxs/prior Treatment) HPI Patient is a 68 yo M presenting with blood in urine and abdominal pain. Patient is followed by Dr. Alinda Money at [facility] for hematuria. Last UTI was about 5 months ago. He has a history of prostatectomy in 2013. He had a CT scan a few months ago ordered by Dr. Alinda Money to evaluate his kidneys and bladder, and patient reports everything looked normal except a small kidney stone. This current episode started 4 days ago. Associated with chills and abdominal pain. No back pain, dysuria, urinary frequency. States his DM is "doing fine right now."  He has HTN, and bp is currently 101/67.  Past Medical History  Diagnosis Date  . Hypertension   . Diabetes mellitus without complication   . Hemorrhoids   . High cholesterol   . Arthritis     trouble turning head side to side and up   Past Surgical History  Procedure Laterality Date  . Right  achilles tendon repair  yrs ago  . Robot assisted laparoscopic radical prostatectomy  06/04/2012    Procedure: ROBOTIC ASSISTED LAPAROSCOPIC RADICAL PROSTATECTOMY LEVEL 3;  Surgeon: Dutch Gray, MD;  Location: [facility];  Service: Urology;  Laterality: N/A;  . Lymphadenectomy  06/04/2012    Procedure: LYMPHADENECTOMY;  Surgeon: Dutch Gray, MD;  Location: [facility];  Service: Urology;  Laterality: Bilateral;   History reviewed. No pertinent family history. History  Substance Use Topics  . Smoking status: Former Smoker -- 0.25 packs/day for 4 years    Types: Cigarettes  . Smokeless tobacco: Never Used  . Alcohol Use: No    Review of Systems  Constitutional: Negative for fever and chills.  HENT: Negative for congestion.   Eyes: Negative for visual disturbance.  Respiratory: Negative for  cough and shortness of breath.   Cardiovascular: Negative for chest pain and leg swelling.  Gastrointestinal: Positive for abdominal pain.  Genitourinary: Positive for hematuria. Negative for dysuria, urgency and difficulty urinating.  Musculoskeletal: Negative for arthralgias and myalgias.  Skin: Negative for rash.  Neurological: Negative for headaches.    Allergies  Review of patient's allergies indicates no known allergies.  Home Medications   Prior to Admission medications   Medication Sig Start Date End Date Taking? Authorizing Provider  metFORMIN (GLUCOPHAGE) 500 MG tablet Take 1,000 mg by mouth daily with breakfast.   Yes Historical Provider, MD  amLODipine-valsartan (EXFORGE) 10-320 MG per tablet Take 1 tablet by mouth every morning.    Historical Provider, MD  ciprofloxacin (CIPRO) 500 MG tablet Take 1 tablet (500 mg total) by mouth 2 (two) times daily. Start day prior to office visit for foley removal 06/04/12   Marcie Bal, PA-C  glimepiride (AMARYL) 2 MG tablet Take 2 mg by mouth daily before breakfast.    Historical Provider, MD  HYDROcodone-acetaminophen (NORCO) 5-325 MG per tablet Take 1-2 tablets by mouth every 6 (six) hours as needed for pain. 06/04/12   Marcie Bal, PA-C  indomethacin (INDOCIN SR) 75 MG CR capsule Take 75 mg by mouth daily at 10 pm.    Historical Provider, MD  Liraglutide (VICTOZA) 18 MG/3ML SOLN Inject 1.8 mg into the skin daily.    Historical Provider, MD  rosuvastatin (CRESTOR) 10 MG tablet Take 10 mg by mouth every  morning.    Historical Provider, MD   BP 101/67  Pulse 102  Temp(Src) 98.4 F (36.9 C) (Oral)  Resp 16  SpO2 95% Physical Exam  Constitutional: He is oriented to person, place, and time. He appears well-developed and well-nourished. No distress.  HENT:  Head: Normocephalic and atraumatic.  Mouth/Throat: Oropharynx is clear and moist.  Neck: Normal range of motion. Neck supple.  Cardiovascular: Normal rate, regular  rhythm and normal heart sounds.   No murmur heard. Pulmonary/Chest: Effort normal and breath sounds normal. He has no wheezes.  Abdominal: Soft. He exhibits no distension and no mass. There is no tenderness. There is no rebound and no guarding.  Musculoskeletal: Normal range of motion. He exhibits no edema and no tenderness.  Neurological: He is alert and oriented to person, place, and time.  Skin: Skin is warm and dry.  Psychiatric: He has a normal mood and affect.    ED Course  Procedures (including critical care time) Labs Review Labs Reviewed  POCT URINALYSIS DIP (DEVICE) - Abnormal; Notable for the following:    Glucose, UA 100 (*)    Bilirubin Urine SMALL (*)    Ketones, ur 40 (*)    Hgb urine dipstick LARGE (*)    Protein, ur >=300 (*)    Nitrite POSITIVE (*)    Leukocytes, UA SMALL (*)    All other components within normal limits  POCT I-STAT, CHEM 8 - Abnormal; Notable for the following:    Potassium 3.4 (*)    Chloride 94 (*)    Glucose, Bld 297 (*)    All other components within normal limits  URINE CULTURE   Imaging Review No results found.  MDM   1. UTI (lower urinary tract infection)    5:35pm- Patient with complex urologic history presenting with hematuria. UA dip was positive for everything. Patient does not feel well, BP soft and tachycardic. Will check iStat. 6:37pm- iStat does not show renal failure. Glucose elevated. Will treat as UTI. No signs of pyelonephritis (temp normal, no CVA tenderness.) Will send culture on urine. Treat with Keflex x10 days. Patient will follow up with Dr. Alinda Money within the next 2 weeks.   Montez Morita, MD 12/22/13 [phone]

## 2013-12-22 NOTE — Discharge Instructions (Signed)
Urinary Tract Infection °A urinary tract infection (UTI) can occur any place along the urinary tract. The tract includes the kidneys, ureters, bladder, and urethra. A type of germ called bacteria often causes a UTI. UTIs are often helped with antibiotic medicine.  °HOME CARE  °· If given, take antibiotics as told by your doctor. Finish them even if you start to feel better. °· Drink enough fluids to keep your pee (urine) clear or pale yellow. °· Avoid tea, drinks with caffeine, and bubbly (carbonated) drinks. °· Pee often. Avoid holding your pee in for a long time. °· Pee before and after having sex (intercourse). °· Wipe from front to back after you poop (bowel movement) if you are a woman. Use each tissue only once. °GET HELP RIGHT AWAY IF:  °· You have back pain. °· You have lower belly (abdominal) pain. °· You have chills. °· You feel sick to your stomach (nauseous). °· You throw up (vomit). °· Your burning or discomfort with peeing does not go away. °· You have a fever. °· Your symptoms are not better in 3 days. °MAKE SURE YOU:  °· Understand these instructions. °· Will watch your condition. °· Will get help right away if you are not doing well or get worse. °Document Released: 01/01/2008 Document Revised: 04/08/2012 Document Reviewed: 02/13/2012 °[employer]® Patient Information ©2014 [employer]. ° °

## 2013-12-23 NOTE — ED Provider Notes (Signed)
Medical screening examination/treatment/procedure(s) were performed by a resident physician or non-physician practitioner and as the supervising physician I was immediately available for consultation/collaboration.  Lynne Leader, MD    Gregor Hams, MD 12/23/13 [phone]

## 2013-12-27 LAB — URINE CULTURE: Colony Count: >=100000

## 2013-12-29 NOTE — ED Notes (Signed)
Review of final UA culture report per Dr Georgina Snell; coverage adequate

## 2014-04-07 ENCOUNTER — Other Ambulatory Visit: Payer: Self-pay | Admitting: Urology

## 2014-04-08 ENCOUNTER — Encounter (HOSPITAL_COMMUNITY): Payer: Self-pay | Admitting: Pharmacy Technician

## 2014-04-12 ENCOUNTER — Other Ambulatory Visit (HOSPITAL_COMMUNITY): Payer: Self-pay | Admitting: *Deleted

## 2014-04-13 ENCOUNTER — Encounter (HOSPITAL_COMMUNITY)
Admission: RE | Admit: 2014-04-13 | Discharge: 2014-04-13 | Disposition: A | Discharge status: Home / Self Care | Payer: Medicare Other | Source: Ambulatory Visit | Attending: Urology | Admitting: Urology

## 2014-04-13 ENCOUNTER — Encounter (HOSPITAL_COMMUNITY): Payer: Self-pay

## 2014-04-13 DIAGNOSIS — Z01810 Encounter for preprocedural cardiovascular examination: Secondary | ICD-10-CM | POA: Diagnosis present

## 2014-04-13 DIAGNOSIS — Z01812 Encounter for preprocedural laboratory examination: Secondary | ICD-10-CM | POA: Diagnosis present

## 2014-04-13 DIAGNOSIS — N200 Calculus of kidney: Secondary | ICD-10-CM | POA: Diagnosis not present

## 2014-04-13 LAB — BASIC METABOLIC PANEL
Anion gap: 12 (ref 5–15)
BUN: 15 mg/dL (ref 6–23)
CHLORIDE: 101 meq/L (ref 96–112)
CO2: 26 mEq/L (ref 19–32)
CREATININE: 0.7 mg/dL (ref 0.50–1.35)
Calcium: 9.8 mg/dL (ref 8.4–10.5)
GFR calc non Af Amer: >90 mL/min (ref >90)
Glucose, Bld: 187 mg/dL — ABNORMAL HIGH (ref 70–99)
Potassium: 4 mEq/L (ref 3.7–5.3)
Sodium: 139 mEq/L (ref 137–147)

## 2014-04-13 LAB — CBC
HEMATOCRIT: 40.6 % (ref 39.0–52.0)
Hemoglobin: 13.2 g/dL (ref 13.0–17.0)
MCH: 26.8 pg (ref 26.0–34.0)
MCHC: 32.5 g/dL (ref 30.0–36.0)
MCV: 82.5 fL (ref 78.0–100.0)
Platelets: 178 10*3/uL (ref 150–400)
RBC: 4.92 MIL/uL (ref 4.22–5.81)
RDW: 15.1 % (ref 11.5–15.5)
WBC: 5.8 10*3/uL (ref 4.0–10.5)

## 2014-04-13 NOTE — Progress Notes (Signed)
04/13/14 1449  OBSTRUCTIVE SLEEP APNEA  Have you ever been diagnosed with sleep apnea through a sleep study? No  Do you snore loudly (loud enough to be heard through closed doors)?  1  Do you often feel tired, fatigued, or sleepy during the daytime? 0  Has anyone observed you stop breathing during your sleep? 0  Do you have, or are you being treated for high blood pressure? 1  BMI more than 35 kg/m2? 1  Age over 67 years old? 1  Neck circumference greater than 40 cm/16 inches? 1  Gender: 1  Obstructive Sleep Apnea Score 6  Score 4 or greater  Results sent to PCP

## 2014-04-13 NOTE — Progress Notes (Signed)
02/07/2014-Chest x-ray from [facility] on chart. 07/16/2001-EKG from [facility] on chart.

## 2014-04-13 NOTE — Patient Instructions (Addendum)
20     Your procedure is scheduled on:  Monday 04/18/2014  Report to [facility] Main Entrance and follow signs to Short Stay  at  1035 AM.  Call this number if you have problems the night before or morning of surgery:  [phone]   Remember:          Do not eat food or drink liquids AFTER MIDNIGHT!  Take these medicines the morning of surgery with A SIP OF WATER: NONE    [facility] IS NOT RESPONSIBLE FOR ANY BELONGINGS OR VALUABLES BROUGHT TO HOSPITAL.  Marland Kitchen  Leave suitcase in the car. After surgery it may be brought to your room.  For patients admitted to the hospital, checkout time is 11:00 AM the day of              Discharge.    DO NOT WEAR  JEWELRY,MAKE-UP,LOTIONS,POWDERS,PERFUMES,CONTACTS , DENTURES OR BRIDGEWORK ,AND DO NOT WEAR FALSE EYELASHES                                    Patients discharged the day of surgery will not be allowed to drive home.  If going home the same day of surgery, must have someone stay with you first 24 hrs.at home and arrange for someone to drive you home from the [place]: friend-Carol Freda Munro   Special Instructions:              Please read over the following fact sheets that you were given:             1. [place] - Preparing for Surgery Before surgery, you can play an important role.  Because skin is not sterile, your skin needs to be as free of germs as possible.  You can reduce the number of germs on your skin by washing with CHG (chlorahexidine gluconate) soap before surgery.  CHG is an antiseptic cleaner which kills germs and bonds with the skin to continue killing germs even after washing. Please DO NOT use if you have an allergy to CHG or antibacterial soaps.  If your skin becomes reddened/irritated stop using the CHG and inform your nurse when you arrive at  Short Stay. Do not shave (including legs and underarms) for at least 48 hours prior to the first CHG shower.  You may shave your face/neck. Please follow these instructions carefully:  1.  Shower with CHG Soap the night before surgery and the  morning of Surgery.  2.  If you choose to wash your hair, wash your hair first as usual with your  normal  shampoo.  3.  After you shampoo, rinse your hair and body thoroughly to remove the  shampoo.  4.  Use CHG as you would any other liquid soap.  You can apply chg directly  to the skin and wash                       Gently with a scrungie or clean washcloth.  5.  Apply the CHG Soap to your body ONLY FROM THE NECK DOWN.   Do not use on face/ open                           Wound or open sores. Avoid contact with eyes, ears mouth and genitals (private parts).                       Wash face,  Genitals (private parts) with your normal soap.             6.  Wash thoroughly, paying special attention to the area where your surgery  will be performed.  7.  Thoroughly rinse your body with warm water from the neck down.  8.  DO NOT shower/wash with your normal soap after using and rinsing off  the CHG Soap.                9.  Pat yourself dry with a clean towel.            10.  Wear clean pajamas.            11.  Place clean sheets on your bed the night of your first shower and do not  sleep with pets. Day of Surgery : Do not apply any lotions/deodorants the morning of surgery.  Please wear clean clothes to the hospital/surgery center.  FAILURE TO FOLLOW THESE INSTRUCTIONS MAY RESULT IN THE CANCELLATION OF YOUR SURGERY PATIENT SIGNATURE_________________________________  NURSE SIGNATURE__________________________________  ________________________________________________________________________   Adam Phenix  An incentive spirometer is a tool that can help keep your lungs clear and active. This tool measures how well you are  filling your lungs with each breath. Taking long deep breaths may help reverse or decrease the chance of developing breathing (pulmonary) problems (especially infection) following:  A long period of time when you are unable to move or be active. BEFORE THE PROCEDURE   If the spirometer includes an indicator to show your best effort, your nurse or respiratory therapist will set it to a desired goal.  If possible, sit up straight or lean slightly forward. Try not to slouch.  Hold the incentive spirometer in an upright position. INSTRUCTIONS FOR USE  1. Sit on the edge of your bed if possible, or sit up as far as you can in bed or on a chair. 2. Hold the incentive spirometer in an upright position. 3. Breathe out normally. 4. Place the mouthpiece in your mouth and seal your lips tightly around it. 5. Breathe in slowly and as deeply as possible, raising the piston or the ball toward the top of the column. 6. Hold your breath for 3-5 seconds or for as long as possible. Allow the piston or ball to fall to the bottom of the column. 7. Remove the mouthpiece from your mouth and breathe out normally. 8. Rest for a few seconds and repeat Steps 1 through 7 at least 10 times every 1-2 hours when you are awake. Take your time and take a few normal breaths between deep breaths. 9. The spirometer may include an indicator to show  your best effort. Use the indicator as a goal to work toward during each repetition. 10. After each set of 10 deep breaths, practice coughing to be sure your lungs are clear. If you have an incision (the cut made at the time of surgery), support your incision when coughing by placing a pillow or rolled up towels firmly against it. Once you are able to get out of bed, walk around indoors and cough well. You may stop using the incentive spirometer when instructed by your caregiver.  RISKS AND COMPLICATIONS  Take your time so you do not get dizzy or light-headed.  If you are in pain,  you may need to take or ask for pain medication before doing incentive spirometry. It is harder to take a deep breath if you are having pain. AFTER USE  Rest and breathe slowly and easily.  It can be helpful to keep track of a log of your progress. Your caregiver can provide you with a simple table to help with this. If you are using the spirometer at home, follow these instructions: [place] IF:   You are having difficultly using the spirometer.  You have trouble using the spirometer as often as instructed.  Your pain medication is not giving enough relief while using the spirometer.  You develop fever of 100.5 F (38.1 C) or higher. SEEK IMMEDIATE MEDICAL CARE IF:   You cough up bloody sputum that had not been present before.  You develop fever of 102 F (38.9 C) or greater.  You develop worsening pain at or near the incision site. MAKE SURE YOU:   Understand these instructions.  Will watch your condition.  Will get help right away if you are not doing well or get worse. Document Released: 11/25/2006 Document Revised: 10/07/2011 Document Reviewed: 01/26/2007 [facility] Patient Information 2014 [place], [place].   ________________________________________________________________________

## 2014-04-13 NOTE — Progress Notes (Signed)
Shown EKG from today to Dr. Delma Post and he states "will evaluate patient day of surgery"

## 2014-04-13 NOTE — Progress Notes (Deleted)
04/13/14 1449  OBSTRUCTIVE SLEEP APNEA  Have you ever been diagnosed with sleep apnea through a sleep study? No  Do you snore loudly (loud enough to be heard through closed doors)?  1  Do you often feel tired, fatigued, or sleepy during the daytime? 0  Has anyone observed you stop breathing during your sleep? 0  Do you have, or are you being treated for high blood pressure? 1  BMI more than 35 kg/m2? 1  Age over 67 years old? 1  Neck circumference greater than 40 cm/16 inches? 1  Gender: 1  Obstructive Sleep Apnea Score 6  Score 4 or greater  Results sent to PCP

## 2014-04-15 NOTE — H&P (Signed)
History of Present Illness Jay Holloway is a 68 year old with the following urologic history:    1) Prostate cancer: He is s/p a BNS RAL radical prostatectomy and BPLND on 06/04/12. His PSA has been undetectable since surgery.    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 He has no history of GU malignancy or personal history of tobacco use. He underwent a full urologic evaluation in May 2015.    May 2015: Cystoscopy unremarkable, CT imaging - bilateral non-obstructing renal calculi (L > R)    3) Urolithiasis: He has a history of urolithiasis.    May 2015: CT - bilateral non-obstructing stones (largest 9 mm LLP)    Interval history:    He follows up today for prostate cancer surveillance 1-1/2 years out from his radical prostatectomy. He continues to maintain good continence and denies any urinary leakage. He has had multiple urinary tract infections over the past few months. He originally had a Serratia urinary tract infection last August that was somewhat difficult to clear record multiple rounds of antibiotics. He presented last spring with hematuria and underwent a CT scan which demonstrated bilateral nonobstructing stones but no other concerning causes for his hematuria. Cystoscopy was also unremarkable. He apparently developed blood in urine presenting to the emergency department in May and was diagnosed with a Klebsiella urinary tract infection. He again developed hematuria and was diagnosed with a urinary tract infection by Dr. Wilson Singer in July. I do not have those culture results today. He currently denies any lower urinary tract symptoms including no dysuria, hematuria, flank pain, or fever. He has not seen any change in his erectile function  although is potentially interested in trying to improve this.     Past Medical History Problems  1. History of Arthritis (V13.4) 2. History of diabetes mellitus (V12.29) 3. History of hypercholesterolemia (V12.29) 4. History of hypertension (V12.59) 5. History of kidney stones (V13.01)  Surgical History Problems  1. History of Ankle Surgery 2. History of Biopsy Of The Prostate Needle 3. History of Laparoscopy With Bilateral Total Pelvic Lymphadenectomy 4. History of Prostatect Retropubic Radical W/ Nerve Sparing Laparoscopic  Current Meds 1. AmLODIPine Besylate 10 MG Oral Tablet;  Therapy: 25Sep2013 to Recorded 2. Cialis 5 MG Oral Tablet; TAKE 1 TABLET As Directed (Daily use tablets);  Therapy: [id number] to (Last Rx:19Feb2014)  Requested for: [id number] Ordered 3. Crestor 10 MG Oral Tablet;  Therapy: 28Jun2012 to Recorded 4. Exforge 10-320 MG Oral Tablet;  Therapy: 28Jun2012 to Recorded 5. Indomethacin ER 75 MG Oral Capsule Extended Release;  Therapy: 28Jun2012 to Recorded 6. MetFORMIN HCl - 500 MG Oral Tablet;  Therapy: (Recorded:01Aug2013) to Recorded 7. Naproxen DR 500 MG Oral Tablet Delayed Release;  Therapy: 04Apr2014 to Recorded 8. Victoza 18 MG/3ML Subcutaneous Solution Pen-injector;  Therapy: [id number] to Recorded  Allergies Medication  1. No Known Drug Allergies  Family History Problems  1. Denied: Family history of Prostate Cancer 2. Family history of Renal Failure : Brother  Social History Problems  1. Alcohol Use   1-2/wk 2. Never A Smoker  Vitals Vital Signs [Data Includes: Last 1 Day]  Recorded: 04Sep2015 02:54PM  Weight: 287 lb  BMI Calculated: 35.87 BSA Calculated: 2.56 Blood Pressure: 144 / 82 Heart Rate: 85  Physical Exam Constitutional: Well nourished and well  developed . No acute distress.    Results/Data Urine [Data Includes: Last 1 Day]   [id number]  COLOR YELLOW   APPEARANCE CLOUDY   SPECIFIC GRAVITY 1.025   pH 6.0   GLUCOSE  NEG mg/dL  BILIRUBIN NEG   KETONE NEG mg/dL  BLOOD SMALL   PROTEIN 30 mg/dL  UROBILINOGEN 0.2 mg/dL  NITRITE POS   LEUKOCYTE ESTERASE MOD   SQUAMOUS EPITHELIAL/HPF RARE   WBC TNTC WBC/hpf  RBC 0-2 RBC/hpf  BACTERIA MANY   CRYSTALS NONE SEEN   CASTS NONE SEEN   Other MUCUS NOTED    Urine will be cultured.  Assessment Assessed  1. Adenocarcinoma of prostate (185) 2. Gross hematuria (599.71) 3. Urolithiasis (592.9)  Plan Adenocarcinoma of prostate  1. Follow-up Month x 6 Office  Follow-up  Status: Hold For - Date of Service  Requested for:  [id number] 2. PSA; Status:Hold For - Specimen/Data Collection; Requested for:02Mar2016;  Health Maintenance  3. UA With REFLEX; [Do Not Release]; Status:Complete;   Done: [id number] 02:32PM Urinary tract infection  4. URINE CULTURE; Status:In Progress - Specimen/Data Collected;   Done: [id number]  Discussion/Summary 1. Prostate cancer: No evidence for disease recurrence. Follow-up in 6 months for continued surveillance.    2. Erectile dysfunction: He was provided samples for Cialis 20 mg prn and instructed on proper use and potential side effects.    3. Urinary tract infection: His urine will be cultured. Considering he is asymptomatic, he will not be treated with antibiotics at this time. I will plan to obtain his culture results from Dr. Eugenio Hoes office and we'll see what his culture shows today. If he does continue to have urease producing bacteria, he may require treatment of his renal calculi.    4. Urolithiasis: He currently remains asymptomatic from his renal calculi. We discussed the option of proceeding with a metabolic evaluation at this time although we will await further workup of his recurrent infections.    Cc: Dr. Gareth Eagle     Verified Results URINE CULTURE1 [phone] 35:36RW4 Read Drivers  SOURCE : CLEAN CATCH SPECIMEN TYPE: URINE  [Apr 06, 2014 9:14PM Jay Holloway] I spoke with Jay Holloway today and  reviewed his urine culture results as well as his recent urine culture results during the summer and last spring which also both were positive for Klebsiella. Considering that he has persistent Klebsiella bacteriuria with evidence of renal calculi and no other explanation for his persistent infections, I have recommended treatment of his calculi beginning with his large burden left renal calculi. We have reviewed options including optimal approaches for stone free rates including percutaneous nephrolithotomy. Considering his goal to avoid a more major procedure and to reduce risk, we also discussed alternative options including ureteroscopic laser lithotripsy. He is interested in proceeding with the latter procedure and the potential risks and complications as well as expected recovery process were discussed in detail. He gives his informed consent to proceed. He will begin ciprofloxacin 5 days prior to his procedure.   Test Name Result Flag Reference  CULTURE, URINE1 Culture, Urine1    ===== COLONY COUNT: =====  >=100,000 COLONIES/ML   FINAL REPORT: KLEBSIELLA PNEUMONIAE   SENSITIVITY FOR: KLEBSIELLA PNEUMONIAE    AMPICILLIN                             RESISTANT       >=32    AMOX/CLAVULANIC  SENSITIVE        <=2    AMPICILLIN/SUL                         SENSITIVE          8    PIPERACILLIN/TAZO                      SENSITIVE        <=4    IMIPENEM                               SENSITIVE     <=0.25    CEFAZOLIN                              SENSITIVE        <=4    CEFTRIAXONE                            SENSITIVE        <=1    CEFTAZIDIME                            SENSITIVE        <=1    CEFEPIME                               SENSITIVE        <=1    GENTAMICIN                             SENSITIVE        <=1    TOBRAMYCIN                             SENSITIVE        <=1    CIPROFLOXACIN                          SENSITIVE     <=0.25    LEVOFLOXACIN                            SENSITIVE     <=0.12    NITROFURANTOIN                         INDETERMINATE     64    TRIMETH/SULFA                          SENSITIVE       <=20    END OF REPORT     1. Amended By: Raynelle Bring; Apr 06 2014 9:14 PM EST  Signatures Electronically signed by : Raynelle Bring, M.D.; Apr 06 2014  9:14PM EST

## 2014-04-18 ENCOUNTER — Ambulatory Visit (HOSPITAL_COMMUNITY): Payer: Medicare Other | Admitting: Anesthesiology

## 2014-04-18 ENCOUNTER — Encounter (HOSPITAL_COMMUNITY): Payer: Self-pay

## 2014-04-18 ENCOUNTER — Ambulatory Visit (HOSPITAL_COMMUNITY)
Admission: RE | Admit: 2014-04-18 | Discharge: 2014-04-18 | Disposition: A | Discharge status: Home / Self Care | Payer: Medicare Other | Source: Ambulatory Visit | Attending: Urology | Admitting: Urology

## 2014-04-18 ENCOUNTER — Encounter (HOSPITAL_COMMUNITY): Payer: Medicare Other | Admitting: Anesthesiology

## 2014-04-18 ENCOUNTER — Encounter (HOSPITAL_COMMUNITY)
Admission: RE | Disposition: A | Discharge status: Home / Self Care | Payer: Self-pay | Source: Ambulatory Visit | Attending: Urology

## 2014-04-18 DIAGNOSIS — E119 Type 2 diabetes mellitus without complications: Secondary | ICD-10-CM | POA: Insufficient documentation

## 2014-04-18 DIAGNOSIS — Z8546 Personal history of malignant neoplasm of prostate: Secondary | ICD-10-CM | POA: Insufficient documentation

## 2014-04-18 DIAGNOSIS — M129 Arthropathy, unspecified: Secondary | ICD-10-CM | POA: Insufficient documentation

## 2014-04-18 DIAGNOSIS — E7800 Pure hypercholesterolemia, unspecified: Secondary | ICD-10-CM | POA: Diagnosis not present

## 2014-04-18 DIAGNOSIS — N200 Calculus of kidney: Secondary | ICD-10-CM | POA: Insufficient documentation

## 2014-04-18 DIAGNOSIS — I10 Essential (primary) hypertension: Secondary | ICD-10-CM | POA: Insufficient documentation

## 2014-04-18 HISTORY — PX: CYSTOSCOPY WITH RETROGRADE PYELOGRAM, URETEROSCOPY AND STENT PLACEMENT: SHX5789

## 2014-04-18 HISTORY — PX: HOLMIUM LASER APPLICATION: SHX5852

## 2014-04-18 LAB — GLUCOSE, CAPILLARY
Glucose-Capillary: 161 mg/dL — ABNORMAL HIGH (ref 70–99)
Glucose-Capillary: 170 mg/dL — ABNORMAL HIGH (ref 70–99)

## 2014-04-18 SURGERY — CYSTOURETEROSCOPY, WITH RETROGRADE PYELOGRAM AND STENT INSERTION
Anesthesia: General | Laterality: Left

## 2014-04-18 MED ORDER — CIPROFLOXACIN IN D5W 400 MG/200ML IV SOLN
400.0000 mg | INTRAVENOUS | Status: AC
  Administered 2014-04-18: 400 mg via INTRAVENOUS

## 2014-04-18 MED ORDER — MIDAZOLAM HCL 5 MG/5ML IJ SOLN
INTRAMUSCULAR | Status: DC | PRN
  Administered 2014-04-18 (×2): 1 mg via INTRAVENOUS

## 2014-04-18 MED ORDER — ACETAMINOPHEN 10 MG/ML IV SOLN
1000.0000 mg | Freq: Four times a day (QID) | INTRAVENOUS | Status: DC
  Administered 2014-04-18: 1000 mg via INTRAVENOUS
  Filled 2014-04-18 (×4): qty 100

## 2014-04-18 MED ORDER — FENTANYL CITRATE 0.05 MG/ML IJ SOLN
INTRAMUSCULAR | Status: AC
  Filled 2014-04-18: qty 2

## 2014-04-18 MED ORDER — PROPOFOL 10 MG/ML IV BOLUS
INTRAVENOUS | Status: DC | PRN
  Administered 2014-04-18: 200 mg via INTRAVENOUS

## 2014-04-18 MED ORDER — FENTANYL CITRATE 0.05 MG/ML IJ SOLN
INTRAMUSCULAR | Status: DC | PRN
  Administered 2014-04-18: 50 ug via INTRAVENOUS

## 2014-04-18 MED ORDER — CIPROFLOXACIN HCL 500 MG PO TABS: 500.0000 mg | ORAL_TABLET | Freq: Two times a day (BID) | ORAL | Status: DC

## 2014-04-18 MED ORDER — FENTANYL CITRATE 0.05 MG/ML IJ SOLN
25.0000 ug | INTRAMUSCULAR | Status: DC | PRN
  Administered 2014-04-18 (×3): 50 ug via INTRAVENOUS

## 2014-04-18 MED ORDER — 0.9 % SODIUM CHLORIDE (POUR BTL) OPTIME
TOPICAL | Status: DC | PRN
  Administered 2014-04-18: 1000 mL

## 2014-04-18 MED ORDER — IOHEXOL 300 MG/ML  SOLN
INTRAMUSCULAR | Status: DC | PRN
  Administered 2014-04-18: 6 mL

## 2014-04-18 MED ORDER — HYDROCODONE-ACETAMINOPHEN 5-325 MG PO TABS: 1.0000 | ORAL_TABLET | Freq: Four times a day (QID) | ORAL | Status: DC | PRN

## 2014-04-18 MED ORDER — CIPROFLOXACIN IN D5W 400 MG/200ML IV SOLN
INTRAVENOUS | Status: AC
  Filled 2014-04-18: qty 200

## 2014-04-18 MED ORDER — SODIUM CHLORIDE 0.9 % IR SOLN
Status: DC | PRN
  Administered 2014-04-18: 4000 mL

## 2014-04-18 MED ORDER — MEPERIDINE HCL 50 MG/ML IJ SOLN: 6.2500 mg | INTRAMUSCULAR | Status: DC | PRN

## 2014-04-18 MED ORDER — LIDOCAINE HCL (CARDIAC) 20 MG/ML IV SOLN
INTRAVENOUS | Status: DC | PRN
  Administered 2014-04-18: 100 mg via INTRAVENOUS

## 2014-04-18 MED ORDER — MIDAZOLAM HCL 2 MG/2ML IJ SOLN
INTRAMUSCULAR | Status: AC
  Filled 2014-04-18: qty 2

## 2014-04-18 MED ORDER — PROMETHAZINE HCL 25 MG/ML IJ SOLN: 6.2500 mg | INTRAMUSCULAR | Status: DC | PRN

## 2014-04-18 MED ORDER — LIDOCAINE HCL (CARDIAC) 20 MG/ML IV SOLN
INTRAVENOUS | Status: AC
  Filled 2014-04-18: qty 5

## 2014-04-18 MED ORDER — LACTATED RINGERS IV SOLN
INTRAVENOUS | Status: DC
  Administered 2014-04-18: 13:00:00 via INTRAVENOUS
  Administered 2014-04-18: 1000 mL via INTRAVENOUS

## 2014-04-18 MED ORDER — PROPOFOL 10 MG/ML IV BOLUS
INTRAVENOUS | Status: AC
  Filled 2014-04-18: qty 20

## 2014-04-18 SURGICAL SUPPLY — 22 items
BAG URO CATCHER STRL LF (DRAPE) ×3 IMPLANT
BASKET ZERO TIP NITINOL 2.4FR (BASKET) ×2 IMPLANT
BSKT STON RTRVL ZERO TP 2.4FR (BASKET) ×1
CATH INTERMIT  6FR 70CM (CATHETERS) IMPLANT
CLOTH BEACON ORANGE TIMEOUT ST (SAFETY) ×3 IMPLANT
DRAPE CAMERA CLOSED 9X96 (DRAPES) ×3 IMPLANT
EXTRACTOR STONE NITINOL NGAGE (UROLOGICAL SUPPLIES) ×2 IMPLANT
FIBER LASER FLEXIVA 1000 (UROLOGICAL SUPPLIES) ×1 IMPLANT
FIBER LASER FLEXIVA 200 (UROLOGICAL SUPPLIES) ×1 IMPLANT
FIBER LASER FLEXIVA 365 (UROLOGICAL SUPPLIES) ×1 IMPLANT
FIBER LASER FLEXIVA 550 (UROLOGICAL SUPPLIES) ×1 IMPLANT
FIBER LASER TRAC TIP (UROLOGICAL SUPPLIES) ×3 IMPLANT
GLOVE BIOGEL M STRL SZ7.5 (GLOVE) ×3 IMPLANT
GOWN STRL REUS W/TWL LRG LVL3 (GOWN DISPOSABLE) ×6 IMPLANT
GUIDEWIRE ANG ZIPWIRE 038X150 (WIRE) ×2 IMPLANT
GUIDEWIRE STR DUAL SENSOR (WIRE) ×3 IMPLANT
MANIFOLD NEPTUNE II (INSTRUMENTS) ×3 IMPLANT
PACK CYSTO (CUSTOM PROCEDURE TRAY) ×3 IMPLANT
SHEATH ACCESS URETERAL 54CM (SHEATH) ×2 IMPLANT
STENT CONTOUR 6FRX26X.038 (STENTS) ×2 IMPLANT
TUBING CONNECTING 10 (TUBING) ×2 IMPLANT
TUBING CONNECTING 10' (TUBING) ×1

## 2014-04-18 NOTE — Discharge Instructions (Signed)
1. You may see some blood in the urine and may have some burning with urination for 48-72 hours. You also may notice that you have to urinate more frequently or urgently after your procedure which is normal.  2. You should call should you develop an inability urinate, fever > 101, persistent nausea and vomiting that prevents you from eating or drinking to stay hydrated.  3. If you have a stent, you will likely urinate more frequently and urgently until the stent is removed and you may experience some discomfort/pain in the lower abdomen and flank especially when urinating. You may take pain medication prescribed to you if needed for pain. You may also intermittently have blood in the urine until the stent is removed. 4. You may remove your stent next Monday morning.  You can simply pull the string coming out of the urethra while in the shower.  The stent will come out easily.  If you experience any pain after the stent is removed, you should take your pain medication and the pain should subside over time.

## 2014-04-18 NOTE — Anesthesia Postprocedure Evaluation (Signed)
  Anesthesia Post-op Note  Patient: Jay Holloway, Jay Holloway  Procedure(s) Performed: Procedure(s) (LRB): CYSTOSCOPY WITH RETROGRADE PYELOGRAM, URETEROSCOPY AND STENT PLACEMENT (Left) HOLMIUM LASER APPLICATION (Left)  Patient Location: PACU  Anesthesia Type: General  Level of Consciousness: awake and alert   Airway and Oxygen Therapy: Patient Spontanous Breathing  Post-op Pain: mild  Post-op Assessment: Post-op Vital signs reviewed, Patient's Cardiovascular Status Stable, Respiratory Function Stable, Patent Airway and No signs of Nausea or vomiting  Last Vitals:  Filed Vitals:   04/18/14 1506  BP: 133/77  Pulse: 86  Temp: 36.3 C  Resp: 16    Post-op Vital Signs: stable   Complications: No apparent anesthesia complications

## 2014-04-18 NOTE — Transfer of Care (Signed)
Immediate Anesthesia Transfer of Care Note  Patient: Jay Holloway  Procedure(s) Performed: Procedure(s): CYSTOSCOPY WITH RETROGRADE PYELOGRAM, URETEROSCOPY AND STENT PLACEMENT (Left) HOLMIUM LASER APPLICATION (Left)  Patient Location: PACU  Anesthesia Type:General  Level of Consciousness: awake, sedated and patient cooperative  Airway & Oxygen Therapy: Patient Spontanous Breathing and Patient connected to face mask oxygen  Post-op Assessment: Report given to PACU RN and Post -op Vital signs reviewed and stable  Post vital signs: Reviewed and stable  Complications: No apparent anesthesia complications

## 2014-04-18 NOTE — Anesthesia Preprocedure Evaluation (Addendum)
Anesthesia Evaluation  Patient identified by MRN, date of birth, ID band Patient awake    Reviewed: Allergy & Precautions, H&P , NPO status , Patient's Chart, lab work & pertinent test results  Airway Mallampati: II TM Distance: >3 FB Neck ROM: full    Dental no notable dental hx. (+) Missing, Dental Advisory Given,    Pulmonary neg pulmonary ROS, former smoker,  breath sounds clear to auscultation  Pulmonary exam normal       Cardiovascular Exercise Tolerance: Good hypertension, Pt. on medications Rhythm:regular Rate:Normal  Bifascicular block   Neuro/Psych negative neurological ROS  negative psych ROS   GI/Hepatic negative GI ROS, Neg liver ROS,   Endo/Other  diabetes, Well Controlled, Type 2, Oral Hypoglycemic Agents  Renal/GU negative Renal ROS  negative genitourinary   Musculoskeletal   Abdominal   Peds  Hematology negative hematology ROS (+)   Anesthesia Other Findings   Reproductive/Obstetrics negative OB ROS                          Anesthesia Physical  Anesthesia Plan  ASA: II  Anesthesia Plan: General   Post-op Pain Management:    Induction:   Airway Management Planned: LMA  Additional Equipment:   Intra-op Plan:   Post-operative Plan:   Informed Consent: I have reviewed the patients History and Physical, chart, labs and discussed the procedure including the risks, benefits and alternatives for the proposed anesthesia with the patient or authorized representative who has indicated his/her understanding and acceptance.   Dental Advisory Given  Plan Discussed with: CRNA  Anesthesia Plan Comments:         Anesthesia Quick Evaluation

## 2014-04-18 NOTE — Op Note (Signed)
Preoperative diagnosis: Left renal calculi  Postoperative diagnosis: Left renal calculi  Procedure:  1. Cystoscopy 2. Left ureteroscopy and stone removal 3. Ureteroscopic laser lithotripsy 4. Left ureteral stent placement (6 x 26 with string) 5. Left retrograde pyelography with interpretation  Surgeon: Pryor Curia. M.D.  Anesthesia: General  Complications: None  Intraoperative findings: Left retrograde pyelography demonstrated a filling defect within the lower pole of the left kidney consistent with the patient's known calculi without other abnormalities noted.  EBL: Minimal  Specimens: 1. Left renal calculi  Disposition of specimens: [facility] for stone analysis  Indication: Jay Holloway  is a 68 y.o. patient with urolithiasis including large burden left renal calculi with his largest stone measuring up to 9 mm and recurrent urease producing bacterial UTIs. After reviewing the management options for treatment, they elected to proceed with the above surgical procedure(s). We have discussed the potential benefits and risks of the procedure, side effects of the proposed treatment, the likelihood of the patient achieving the goals of the procedure, and any potential problems that might occur during the procedure or recuperation. Informed consent has been obtained.  Description of procedure:  The patient was taken to the operating room and general anesthesia was induced.  The patient was placed in the dorsal lithotomy position, prepped and draped in the usual sterile fashion, and preoperative antibiotics were administered. A preoperative time-out was performed.   Cystourethroscopy was performed.  The patient's urethra was examined and was normal. The prostatic urethra was surgically absent. The bladder was then systematically examined in its entirety. There was no evidence for any bladder tumors, stones, or other mucosal pathology.    Attention then turned  to the left ureteral orifice and a ureteral catheter was used to intubate the ureteral orifice.  Omnipaque contrast was injected through the ureteral catheter and a retrograde pyelogram was performed with findings as dictated above.  A 0.38 sensor guidewire was then advanced up the left ureter into the renal pelvis under fluoroscopic guidance.  A 12/14 Fr ureteral access sheath was then advanced over the guide wire. The digital flexible ureteroscope was then advanced through the access sheath into the ureter next to the guidewire and the calculi including the largest 9 mm stone was identified and was located in the lower pole of the left renal collecting system.   The stone was then fragmented with the 200 micron holmium laser fiber on a setting of 1.0 J and frequency of 6 Hz.   All sizable stones were then removed with a zero tip nitinol basket or N- gauge basket.  Reinspection of the ureter/renal pelvis revealed no remaining visible stones or fragments of significant size.   The safety wire was then replaced and the access sheath removed.  The guidewire was backloaded through the cystoscope and a ureteral stent was advance over the wire using Seldinger technique.  The stent was positioned appropriately under fluoroscopic and cystoscopic guidance.  The wire was then removed with an adequate stent curl noted in the renal pelvis as well as in the bladder.  The bladder was then emptied and the procedure ended.  The patient appeared to tolerate the procedure well and without complications.  The patient was able to be awakened and transferred to the recovery unit in satisfactory condition.   Pryor Curia MD

## 2014-04-18 NOTE — Interval H&P Note (Signed)
History and Physical Interval Note:  04/18/2014 11:44 AM  Jay Holloway  has presented today for surgery, with the diagnosis of LEFT RENAL CALCULI  The various methods of treatment have been discussed with the patient and family. After consideration of risks, benefits and other options for treatment, the patient has consented to  Procedure(s): CYSTOSCOPY WITH RETROGRADE PYELOGRAM, URETEROSCOPY AND STENT PLACEMENT (Left) HOLMIUM LASER APPLICATION (Left) as a surgical intervention .  The patient's history has been reviewed, patient examined, no change in status, stable for surgery.  I have reviewed the patient's chart and labs.  Questions were answered to the patient's satisfaction.     Aiyah Scarpelli,LES

## 2014-04-19 ENCOUNTER — Encounter (HOSPITAL_COMMUNITY): Payer: Self-pay | Admitting: Urology

## 2014-11-05 ENCOUNTER — Emergency Department (HOSPITAL_COMMUNITY): Payer: Medicare Other

## 2014-11-05 ENCOUNTER — Inpatient Hospital Stay (HOSPITAL_COMMUNITY)
Admission: EM | Admit: 2014-11-05 | Discharge: 2014-11-13 | DRG: 853 | Disposition: A | Discharge status: Home / Self Care | Payer: Medicare Other | Attending: Internal Medicine | Admitting: Internal Medicine

## 2014-11-05 ENCOUNTER — Encounter (HOSPITAL_COMMUNITY): Payer: Self-pay | Admitting: Family Medicine

## 2014-11-05 DIAGNOSIS — E1310 Other specified diabetes mellitus with ketoacidosis without coma: Secondary | ICD-10-CM | POA: Diagnosis present

## 2014-11-05 DIAGNOSIS — K8050 Calculus of bile duct without cholangitis or cholecystitis without obstruction: Secondary | ICD-10-CM

## 2014-11-05 DIAGNOSIS — M1990 Unspecified osteoarthritis, unspecified site: Secondary | ICD-10-CM | POA: Diagnosis present

## 2014-11-05 DIAGNOSIS — Z794 Long term (current) use of insulin: Secondary | ICD-10-CM | POA: Diagnosis present

## 2014-11-05 DIAGNOSIS — Z87891 Personal history of nicotine dependence: Secondary | ICD-10-CM | POA: Diagnosis not present

## 2014-11-05 DIAGNOSIS — Z79899 Other long term (current) drug therapy: Secondary | ICD-10-CM | POA: Diagnosis not present

## 2014-11-05 DIAGNOSIS — G9340 Encephalopathy, unspecified: Secondary | ICD-10-CM | POA: Diagnosis present

## 2014-11-05 DIAGNOSIS — I10 Essential (primary) hypertension: Secondary | ICD-10-CM | POA: Diagnosis present

## 2014-11-05 DIAGNOSIS — K8020 Calculus of gallbladder without cholecystitis without obstruction: Secondary | ICD-10-CM

## 2014-11-05 DIAGNOSIS — Z79891 Long term (current) use of opiate analgesic: Secondary | ICD-10-CM | POA: Diagnosis not present

## 2014-11-05 DIAGNOSIS — R7881 Bacteremia: Secondary | ICD-10-CM | POA: Diagnosis present

## 2014-11-05 DIAGNOSIS — E861 Hypovolemia: Secondary | ICD-10-CM | POA: Diagnosis present

## 2014-11-05 DIAGNOSIS — N390 Urinary tract infection, site not specified: Secondary | ICD-10-CM | POA: Diagnosis present

## 2014-11-05 DIAGNOSIS — Z8546 Personal history of malignant neoplasm of prostate: Secondary | ICD-10-CM

## 2014-11-05 DIAGNOSIS — N179 Acute kidney failure, unspecified: Secondary | ICD-10-CM | POA: Diagnosis present

## 2014-11-05 DIAGNOSIS — R531 Weakness: Secondary | ICD-10-CM

## 2014-11-05 DIAGNOSIS — I959 Hypotension, unspecified: Secondary | ICD-10-CM

## 2014-11-05 DIAGNOSIS — K5730 Diverticulosis of large intestine without perforation or abscess without bleeding: Secondary | ICD-10-CM | POA: Diagnosis present

## 2014-11-05 DIAGNOSIS — N19 Unspecified kidney failure: Secondary | ICD-10-CM | POA: Diagnosis present

## 2014-11-05 DIAGNOSIS — E780 Pure hypercholesterolemia: Secondary | ICD-10-CM | POA: Diagnosis present

## 2014-11-05 DIAGNOSIS — E860 Dehydration: Secondary | ICD-10-CM | POA: Diagnosis present

## 2014-11-05 DIAGNOSIS — Z791 Long term (current) use of non-steroidal anti-inflammatories (NSAID): Secondary | ICD-10-CM | POA: Diagnosis not present

## 2014-11-05 DIAGNOSIS — A058 Other specified bacterial foodborne intoxications: Secondary | ICD-10-CM | POA: Diagnosis not present

## 2014-11-05 DIAGNOSIS — K8000 Calculus of gallbladder with acute cholecystitis without obstruction: Secondary | ICD-10-CM | POA: Diagnosis present

## 2014-11-05 DIAGNOSIS — A419 Sepsis, unspecified organism: Principal | ICD-10-CM

## 2014-11-05 DIAGNOSIS — E0810 Diabetes mellitus due to underlying condition with ketoacidosis without coma: Secondary | ICD-10-CM | POA: Diagnosis not present

## 2014-11-05 DIAGNOSIS — E785 Hyperlipidemia, unspecified: Secondary | ICD-10-CM | POA: Diagnosis present

## 2014-11-05 DIAGNOSIS — IMO0001 Reserved for inherently not codable concepts without codable children: Secondary | ICD-10-CM | POA: Insufficient documentation

## 2014-11-05 DIAGNOSIS — E1110 Type 2 diabetes mellitus with ketoacidosis without coma: Secondary | ICD-10-CM

## 2014-11-05 DIAGNOSIS — E1311 Other specified diabetes mellitus with ketoacidosis with coma: Secondary | ICD-10-CM | POA: Diagnosis not present

## 2014-11-05 DIAGNOSIS — R109 Unspecified abdominal pain: Secondary | ICD-10-CM

## 2014-11-05 DIAGNOSIS — E1165 Type 2 diabetes mellitus with hyperglycemia: Secondary | ICD-10-CM | POA: Diagnosis present

## 2014-11-05 HISTORY — DX: Malignant neoplasm of prostate: C61

## 2014-11-05 LAB — URINALYSIS, ROUTINE W REFLEX MICROSCOPIC
Glucose, UA: >1000 mg/dL — AB
KETONES UR: 40 mg/dL — AB
LEUKOCYTES UA: NEGATIVE
Nitrite: NEGATIVE
PROTEIN: NEGATIVE mg/dL
Specific Gravity, Urine: 1.019 (ref 1.005–1.030)
Urobilinogen, UA: 0.2 mg/dL (ref 0.0–1.0)
pH: 5 (ref 5.0–8.0)

## 2014-11-05 LAB — CBC WITH DIFFERENTIAL/PLATELET
Basophils Absolute: 0 10*3/uL (ref 0.0–0.1)
Basophils Relative: 0 % (ref 0–1)
Eosinophils Absolute: 0 10*3/uL (ref 0.0–0.7)
Eosinophils Relative: 0 % (ref 0–5)
HCT: 43.7 % (ref 39.0–52.0)
HEMOGLOBIN: 14.2 g/dL (ref 13.0–17.0)
LYMPHS PCT: 13 % (ref 12–46)
Lymphs Abs: 1.7 10*3/uL (ref 0.7–4.0)
MCH: 27.6 pg (ref 26.0–34.0)
MCHC: 32.5 g/dL (ref 30.0–36.0)
MCV: 84.9 fL (ref 78.0–100.0)
MONO ABS: 1.1 10*3/uL — AB (ref 0.1–1.0)
Monocytes Relative: 9 % (ref 3–12)
NEUTROS PCT: 78 % — AB (ref 43–77)
Neutro Abs: 10.5 10*3/uL — ABNORMAL HIGH (ref 1.7–7.7)
PLATELETS: 207 10*3/uL (ref 150–400)
RBC: 5.15 MIL/uL (ref 4.22–5.81)
RDW: 14.5 % (ref 11.5–15.5)
WBC: 13.4 10*3/uL — ABNORMAL HIGH (ref 4.0–10.5)

## 2014-11-05 LAB — COMPREHENSIVE METABOLIC PANEL
ALK PHOS: 99 U/L (ref 39–117)
ALT: 33 U/L (ref 0–53)
AST: 19 U/L (ref 0–37)
Albumin: 4 g/dL (ref 3.5–5.2)
Anion gap: 30 — ABNORMAL HIGH (ref 5–15)
BILIRUBIN TOTAL: 1.8 mg/dL — AB (ref 0.3–1.2)
BUN: 54 mg/dL — ABNORMAL HIGH (ref 6–23)
CHLORIDE: 96 mmol/L (ref 96–112)
CO2: 9 mmol/L — CL (ref 19–32)
CREATININE: 3.33 mg/dL — AB (ref 0.50–1.35)
Calcium: 9 mg/dL (ref 8.4–10.5)
GFR calc Af Amer: 21 mL/min — ABNORMAL LOW (ref >90)
GFR calc non Af Amer: 18 mL/min — ABNORMAL LOW (ref >90)
Glucose, Bld: 427 mg/dL — ABNORMAL HIGH (ref 70–99)
Potassium: 5.4 mmol/L — ABNORMAL HIGH (ref 3.5–5.1)
SODIUM: 135 mmol/L (ref 135–145)
Total Protein: 7.6 g/dL (ref 6.0–8.3)

## 2014-11-05 LAB — POCT I-STAT 3, ART BLOOD GAS (G3+)
Acid-base deficit: 18 mmol/L — ABNORMAL HIGH (ref 0.0–2.0)
BICARBONATE: 9.3 meq/L — AB (ref 20.0–24.0)
O2 SAT: 95 %
PH ART: 7.14 — AB (ref 7.350–7.450)
PO2 ART: 96 mmHg (ref 80.0–100.0)
TCO2: 10 mmol/L (ref 0–100)
pCO2 arterial: 27.4 mmHg — ABNORMAL LOW (ref 35.0–45.0)

## 2014-11-05 LAB — I-STAT ARTERIAL BLOOD GAS, ED
Acid-base deficit: 21 mmol/L — ABNORMAL HIGH (ref 0.0–2.0)
BICARBONATE: 7.6 meq/L — AB (ref 20.0–24.0)
O2 SAT: 95 %
TCO2: 8 mmol/L (ref 0–100)
pCO2 arterial: 25.1 mmHg — ABNORMAL LOW (ref 35.0–45.0)
pH, Arterial: 7.091 — CL (ref 7.350–7.450)
pO2, Arterial: 101 mmHg — ABNORMAL HIGH (ref 80.0–100.0)

## 2014-11-05 LAB — I-STAT CG4 LACTIC ACID, ED
LACTIC ACID, VENOUS: 1.4 mmol/L (ref 0.5–2.0)
Lactic Acid, Venous: 1.38 mmol/L (ref 0.5–2.0)

## 2014-11-05 LAB — CBG MONITORING, ED
GLUCOSE-CAPILLARY: 383 mg/dL — AB (ref 70–99)
Glucose-Capillary: 367 mg/dL — ABNORMAL HIGH (ref 70–99)
Glucose-Capillary: 303 mg/dL — ABNORMAL HIGH (ref 70–99)

## 2014-11-05 LAB — I-STAT TROPONIN, ED: Troponin i, poc: 0.01 ng/mL (ref 0.00–0.08)

## 2014-11-05 LAB — GLUCOSE, CAPILLARY
GLUCOSE-CAPILLARY: 233 mg/dL — AB (ref 70–99)
GLUCOSE-CAPILLARY: 230 mg/dL — AB (ref 70–99)
Glucose-Capillary: 251 mg/dL — ABNORMAL HIGH (ref 70–99)
Glucose-Capillary: 252 mg/dL — ABNORMAL HIGH (ref 70–99)

## 2014-11-05 LAB — URINE MICROSCOPIC-ADD ON

## 2014-11-05 MED ORDER — SODIUM CHLORIDE 0.9 % IV SOLN
1000.0000 mL | Freq: Once | INTRAVENOUS | Status: AC
  Administered 2014-11-05: 1000 mL via INTRAVENOUS

## 2014-11-05 MED ORDER — CEFTRIAXONE SODIUM IN DEXTROSE 20 MG/ML IV SOLN
1.0000 g | INTRAVENOUS | Status: DC
  Administered 2014-11-05 – 2014-11-06 (×2): 1 g via INTRAVENOUS
  Filled 2014-11-05 (×3): qty 50

## 2014-11-05 MED ORDER — SODIUM CHLORIDE 0.9 % IV SOLN
INTRAVENOUS | Status: DC
  Administered 2014-11-06: 1.8 [IU]/h via INTRAVENOUS
  Filled 2014-11-05: qty 2.5

## 2014-11-05 MED ORDER — SODIUM CHLORIDE 0.9 % IV BOLUS (SEPSIS)
1000.0000 mL | Freq: Once | INTRAVENOUS | Status: AC
  Administered 2014-11-05: 1000 mL via INTRAVENOUS

## 2014-11-05 MED ORDER — POTASSIUM CHLORIDE 10 MEQ/100ML IV SOLN: 10.0000 meq | INTRAVENOUS | Status: DC

## 2014-11-05 MED ORDER — CETYLPYRIDINIUM CHLORIDE 0.05 % MT LIQD
7.0000 mL | Freq: Two times a day (BID) | OROMUCOSAL | Status: DC
  Administered 2014-11-06 – 2014-11-11 (×9): 7 mL via OROMUCOSAL

## 2014-11-05 MED ORDER — DEXTROSE 50 % IV SOLN: 25.0000 mL | INTRAVENOUS | Status: DC | PRN

## 2014-11-05 MED ORDER — ONDANSETRON HCL 4 MG/2ML IJ SOLN
4.0000 mg | Freq: Three times a day (TID) | INTRAMUSCULAR | Status: AC | PRN
  Administered 2014-11-05 – 2014-11-06 (×2): 4 mg via INTRAVENOUS
  Filled 2014-11-05 (×2): qty 2

## 2014-11-05 MED ORDER — ROSUVASTATIN CALCIUM 10 MG PO TABS
10.0000 mg | ORAL_TABLET | Freq: Every morning | ORAL | Status: DC
  Filled 2014-11-05: qty 1

## 2014-11-05 MED ORDER — SODIUM CHLORIDE 0.9 % IV SOLN: INTRAVENOUS | Status: DC

## 2014-11-05 MED ORDER — ONDANSETRON HCL 4 MG/2ML IJ SOLN: 4.0000 mg | Freq: Four times a day (QID) | INTRAMUSCULAR | Status: DC | PRN

## 2014-11-05 MED ORDER — SODIUM CHLORIDE 0.9 % IV SOLN: 250.0000 mL | INTRAVENOUS | Status: DC | PRN

## 2014-11-05 MED ORDER — SODIUM CHLORIDE 0.9 % IV SOLN
INTRAVENOUS | Status: DC
  Administered 2014-11-05: 18:00:00 via INTRAVENOUS

## 2014-11-05 MED ORDER — DEXTROSE-NACL 5-0.45 % IV SOLN: INTRAVENOUS | Status: DC

## 2014-11-05 MED ORDER — HEPARIN SODIUM (PORCINE) 5000 UNIT/ML IJ SOLN
5000.0000 [IU] | Freq: Three times a day (TID) | INTRAMUSCULAR | Status: DC
  Administered 2014-11-05 – 2014-11-13 (×21): 5000 [IU] via SUBCUTANEOUS
  Filled 2014-11-05 (×23): qty 1

## 2014-11-05 MED ORDER — SODIUM CHLORIDE 0.9 % IV SOLN
INTRAVENOUS | Status: AC
  Administered 2014-11-05: 20:00:00 via INTRAVENOUS

## 2014-11-05 MED ORDER — INSULIN REGULAR BOLUS VIA INFUSION
0.0000 [IU] | Freq: Three times a day (TID) | INTRAVENOUS | Status: DC
  Administered 2014-11-06: 1.8 [IU] via INTRAVENOUS
  Filled 2014-11-05: qty 10

## 2014-11-05 MED ORDER — SODIUM CHLORIDE 0.9 % IV SOLN
INTRAVENOUS | Status: DC
  Administered 2014-11-05: 18:00:00 via INTRAVENOUS
  Filled 2014-11-05: qty 2.5

## 2014-11-05 MED ORDER — ONDANSETRON HCL 4 MG/2ML IJ SOLN
4.0000 mg | Freq: Once | INTRAMUSCULAR | Status: AC
  Administered 2014-11-05: 4 mg via INTRAVENOUS
  Filled 2014-11-05: qty 2

## 2014-11-05 MED ORDER — DEXTROSE-NACL 5-0.45 % IV SOLN
INTRAVENOUS | Status: DC
  Administered 2014-11-05 – 2014-11-06 (×2): via INTRAVENOUS

## 2014-11-05 MED ORDER — CHLORHEXIDINE GLUCONATE 0.12 % MT SOLN
15.0000 mL | Freq: Two times a day (BID) | OROMUCOSAL | Status: DC
  Administered 2014-11-05 – 2014-11-12 (×13): 15 mL via OROMUCOSAL
  Filled 2014-11-05 (×13): qty 15

## 2014-11-05 NOTE — H&P (Addendum)
PULMONARY  / CRITICAL CARE MEDICINE HISTORY AND PHYSICAL EXAMINATION  Name: Jay Holloway MRN: [id number] DOB: 1946-04-05    ADMISSION DATE:  11/05/2014  CHIEF COMPLAINT:  Weakness, abdominal pain  BRIEF PATIENT DESCRIPTION: 69 y/o man with type II diabetes, presenting with nausea/vomiting and weakness, labs consistent with DKA.  SIGNIFICANT EVENTS / STUDIES:  1. 11/05/14 Initial ABG: 7.09/25/7.6 2. 11/05/14 CXR: stable elevated right hemidiaphragm, otherwise normal  3. 11/05/14 U/A: ketones, no nitrites or LE  LINES / TUBES: 1. 11/05/14 PIVs x3   CULTURES: 1. 11/05/14 blood cultures x2 --> 2. 11/05/14 urine culture -->  ANTIBIOTICS: 1.  Ceftriaxone 11/05/14 -->   HISTORY OF PRESENT ILLNESS:   69 y/o [place] man with type II diabetes on insulin, presents with approximately 3 days of high blood glucoses that he says did not respond to insulin, so he stopped taking it 24 hours prior to presentation.  He developed weakness, abdominal pain, nausea, and vomiting.  He came to the ED when he was too weak to take care of himself, and he is accompanied by his nephew.  The patient says he has not been admitted in the past with DKA, and that he is usually very compliant with his insulin regimen.  He denies chest pain or discomfort, denies fevers, and denies diarrhea.  He has not had a cough or shortness of breath.  He denies dysuria but has had polyuria and thirst, but with inability to keep solid or liquid food down due to his nausea/vomiting.  He denies headache and neck ache, but has had some blurry vision while his sugars have been so high. He says he does not have any diabetic wounds.   PAST MEDICAL HISTORY :  Past Medical History  Diagnosis Date  . Hypertension   . Diabetes mellitus without complication   . Hemorrhoids   . High cholesterol   . Arthritis     trouble turning head side to side and up  . Prostate cancer     Past Surgical History  Procedure Laterality Date  . Right  achilles  tendon repair  yrs ago  . Robot assisted laparoscopic radical prostatectomy  06/04/2012    Procedure: ROBOTIC ASSISTED LAPAROSCOPIC RADICAL PROSTATECTOMY LEVEL 3;  Surgeon: Dutch Gray, MD;  Location: [facility];  Service: Urology;  Laterality: N/A;  . Lymphadenectomy  06/04/2012    Procedure: LYMPHADENECTOMY;  Surgeon: Dutch Gray, MD;  Location: [facility];  Service: Urology;  Laterality: Bilateral;  . Cystoscopy with retrograde pyelogram, ureteroscopy and stent placement Left 04/18/2014    Procedure: [place], URETEROSCOPY AND STENT PLACEMENT;  Surgeon: Raynelle Bring, MD;  Location: [facility];  Service: Urology;  Laterality: Left;  . Holmium laser application Left 5/63/8756    Procedure: HOLMIUM LASER APPLICATION;  Surgeon: Raynelle Bring, MD;  Location: [facility];  Service: Urology;  Laterality: Left;    MEDICATIONS: Prior to Admission medications   Medication Sig Start Date End Date Taking? Authorizing Provider  amLODipine (NORVASC) 10 MG tablet Take 10 mg by mouth daily.   Yes Historical Provider, MD  glimepiride (AMARYL) 2 MG tablet Take 2 mg by mouth daily before breakfast.   Yes Historical Provider, MD  HYDROcodone-acetaminophen (NORCO) 5-325 MG per tablet Take 1-2 tablets by mouth every 6 (six) hours as needed for pain. 06/04/12  Yes Amanda Dancy, PA-C  indomethacin (INDOCIN SR) 75 MG CR capsule Take 75 mg by mouth daily at 10 pm.   Yes Historical Provider, MD  irbesartan (AVAPRO)  300 MG tablet Take 300 mg by mouth daily. 10/28/14  Yes Historical Provider, MD  Liraglutide (VICTOZA) 18 MG/3ML SOLN Inject 1.8 mg into the skin daily.   Yes Historical Provider, MD  metFORMIN (GLUCOPHAGE) 500 MG tablet Take 1,000 mg by mouth 2 (two) times daily with a meal.    Yes Historical Provider, MD  naproxen (NAPROSYN) 500 MG tablet Take 500 mg by mouth daily. Patient states takes only 1 daily   Yes Historical Provider, MD  ONGLYZA 5 MG TABS tablet Take 5 mg by mouth daily. 09/23/14  Yes  Historical Provider, MD  rosuvastatin (CRESTOR) 10 MG tablet Take 10 mg by mouth every morning.   Yes Historical Provider, MD  traMADol (ULTRAM) 50 MG tablet Take 50 mg by mouth every 6 (six) hours as needed for moderate pain.   Yes Historical Provider, MD  BD PEN NEEDLE NANO U/F 32G X 4 MM MISC  09/21/14   Historical Provider, MD  ciprofloxacin (CIPRO) 500 MG tablet Take 1 tablet (500 mg total) by mouth 2 (two) times daily. Patient not taking: Reported on 11/05/2014 04/18/14   Raynelle Bring, MD  HYDROcodone-acetaminophen (NORCO/VICODIN) 5-325 MG per tablet Take 1-2 tablets by mouth every 6 (six) hours as needed. 04/18/14   Raynelle Bring, MD    ALLERGIES: No Known Allergies  FAMILY HISTORY:  History reviewed. No pertinent family history.  SOCIAL HISTORY:  reports that he quit smoking about 35 years ago. His smoking use included Cigarettes. He has a 1 pack-year smoking history. He has never used smokeless tobacco. He reports that he does not drink alcohol or use illicit drugs.  REVIEW OF SYSTEMS:  12 point review of systems was performed, with pertinent positives and negatives as above in HPI  PHYSICAL EXAM:  Temp:  [95.5 F (35.3 C)-96.8 F (36 C)] 96.8 F (36 C) (04/09 1850) Pulse Rate:  [88-96] 93 (04/09 1900) Resp:  [16-20] 16 (04/09 1900) BP: (85-103)/(37-51) 101/48 mmHg (04/09 1900) SpO2:  [96 %-100 %] 99 % (04/09 1900)   General:  Weak, generally ill appearing man in NAD Neuro:  Moves all extremities. Cranial nerves grossly intact.  HEENT:  PERRL. Extra occular movements intact. Mucus membranes very dry. Neck:  Supple, nontender. Trachea midline.   Lymph: No cervical, supraclavicular, or submandibular lymphadenopathy Cardiovascular:  Regular rhythm, tachycardic rate, no murmurs/rubs/gallops Lungs:  Clear bilaterally - no crackles or wheezes.  Taking deep breaths and moving air well.  No stridor.  Abdomen:  Soft, nontender, nondistended.  No organomegaly. Musculoskeletal:  No  joint deformities, swelling, or erythema. No nail clubbing Skin:  No skin breakdown or rashes.   Intake/Output      04/09 0701 - 04/10 0700   I.V. 3000   Total Intake 3000   Net +3000         CLINICAL DECISION-MAKING:  CBC Recent Labs     11/05/14  1517  WBC  13.4*  HGB  14.2  HCT  43.7  PLT  207    Coags No results for input(s): APTT, INR in the last 72 hours.  BMET Recent Labs     11/05/14  1517  NA  135  K  5.4*  CL  96  CO2  9*  BUN  54*  CREATININE  3.33*  GLUCOSE  427*    Electrolytes Recent Labs     11/05/14  1517  CALCIUM  9.0    Sepsis Markers No results for input(s): PROCALCITON, O2SATVEN in the last 72 hours.  Invalid input(s): LACTICACIDVEN  ABG Recent Labs     11/05/14  1730  PHART  7.091*  PCO2ART  25.1*  PO2ART  101.0*    Liver Enzymes Recent Labs     11/05/14  1517  AST  19  ALT  33  ALKPHOS  99  BILITOT  1.8*  ALBUMIN  4.0    Cardiac Enzymes No results for input(s): TROPONINI, PROBNP in the last 72 hours.  Glucose Recent Labs     11/05/14  1513  11/05/14  1702  11/05/14  1844  GLUCAP  367*  383*  303*    Imaging Dg Chest Portable 1 View  11/05/2014   CLINICAL DATA:  Hyperglycemia and emesis. History of diabetes and hypertension.  EXAM: PORTABLE CHEST - 1 VIEW  COMPARISON:  05/29/2012 and prior chest radiographs dating back to 07/08/2003  FINDINGS: The cardiomediastinal silhouette is unchanged.  Elevation of the right hemidiaphragm and right basilar scarring again noted.  There is no evidence of focal airspace disease, pulmonary edema, suspicious pulmonary nodule/mass, pleural effusion, or pneumothorax. No acute bony abnormalities are identified.  IMPRESSION: No active disease.   Electronically Signed   By: Margarette Canada M.D.   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    ASSESSMENT / PLAN: Active Problems:   DKA (diabetic ketoacidoses)   NEUROLOGIC A: Generalized weakness - due to metabolic derangements and dehydration, no focal findings P: treat DKA as below  PULMONARY No acute issues - protecting airway and compensating for metabolic acidosis. No sign of pneumonia on CXR  CARDIOVASCULAR A: Hypotension -  due to dehydration (and still taking BP meds at home).  Denies chest pain, no changes on EKG from prior  P: improving with fluids - continue to monitor and hold antihypertensives  RENAL A: Acute renal failure - due to DKA and resultant dehydration.  A: hematuria P:  - fluids  - monitor lytes and creatinine - dose meds for AKI (hold ARB, narcotics, and oral hypoglycemics) - get renal ultrasound, given hematuria and AKI, eval for obstruction.  Can consider CT as well, but will start with ultrasound given pt reports no pain   GASTROINTESTINAL A: nausea, vomiting, abdominal pain - likely due to DKA, though could also have viral gastritis based on story. Lactate normal, unlikely to be ischemia.  P:  - zofran PRN - NPO - imaging if pain does not resolve with DKA treatment  HEMATOLOGIC A: leukocytosis with left shift - due to DKA, possible infection P: IV fluids as per DKA protocol, monitor CBC and signs/symptoms of infection  INFECTIOUS DISEASE A: leukocytosis, hypothermia - possible infection, though source not clear.  He has no diabetic wounds, has clear CXR, urine not overtly suspicious on urinalysis, abdominal pain has improved with fluids and abdomen soft/nontender at this time.  ' P:  - empiric ceftriaxone while cultures are pending (blood cx, urine cx)  ENDOCRINE A: DKA - precipitant unclear P: - DKA protocol order set: IV insulin drip, NS (will transition to D5 1/2NS when glucoses <250), chem panel + ABG q4hrs - holding oral hypoglycemic agents  BEST PRACTICE / DISPOSITION Level of Care:  ICU Consultants:  none Code Status:   full Diet:  NPO DVT Px:  Heparin subQ GI Px:  Not indicated  Skin Integrity:  Intact; monitor daily Social / Family:  Nephew  Richard Emeline Darling [phone]  TODAY'S SUMMARY: DKA and acute kidney injury with no clear precipitant, maybe viral gastritis, improving with protocolized treatment. Following labs closely on insulin drip and fluids, as well as empiric antibiotics while cultures are pending.   I have personally obtained a history, examined the patient, evaluated laboratory and imaging results, formulated the assessment and plan and placed orders.  CRITICAL CARE: The patient is critically ill with multiple organ systems failure and requires high complexity decision making for assessment and support, frequent evaluation and titration of therapies, application of advanced monitoring technologies and extensive interpretation of multiple databases. Critical Care Time devoted to patient care services described in this note is 20 minutes.   Timmothy Sours, MD [facility] [place] Pager: [phone]  11/05/2014, 7:52 PM

## 2014-11-05 NOTE — ED Notes (Signed)
Pt placed in trendelenburg

## 2014-11-05 NOTE — ED Notes (Signed)
PT CBG 383

## 2014-11-05 NOTE — ED Notes (Signed)
PA at bedside.

## 2014-11-05 NOTE — ED Notes (Signed)
Pt arrived to room. Pt is lethargic but oriented X 3. Pt reports he has had high blood sugars for three days, only took insulin yesterday. Pt lives alone. Pt unable to get out of wheelchair without help and unable to hold arms out for IV stick.

## 2014-11-05 NOTE — ED Notes (Signed)
PBT at bedside. XRAY at bedside

## 2014-11-05 NOTE — ED Notes (Signed)
Pt hooked up to telemetry monitor and ready for transport, admitting MD's now at bedside.

## 2014-11-05 NOTE — ED Notes (Signed)
Per pt sts his blood sugars have been running high and he has been vomiting and weak.

## 2014-11-05 NOTE — ED Notes (Addendum)
Rechecked pt CBG-303 and Temp-96.8 rectal.

## 2014-11-05 NOTE — ED Provider Notes (Signed)
CSN: [id number]     Arrival date & time 11/05/14  1505 History   First MD Initiated Contact with Patient 11/05/14 1618     Chief Complaint  Patient presents with  . Hyperglycemia  . Emesis     (Consider location/radiation/quality/duration/timing/severity/associated sxs/prior Treatment) HPI Jay Holloway is a 69 y.o. male with hsx of htn, DM, presents to ED with elevated blood sugars for 3 days, weakness, vomiting. Symptoms started 3 days ago. Pt lives along. Family went to check on him today and found him to be very weak, unable to walk. Pt denies URI symptoms. No abdominal pain. No diarrhea. Pt did not take his medications today, because he was feeling very weak.   Past Medical History  Diagnosis Date  . Hypertension   . Diabetes mellitus without complication   . Hemorrhoids   . High cholesterol   . Arthritis     trouble turning head side to side and up   Past Surgical History  Procedure Laterality Date  . Right  achilles tendon repair  yrs ago  . Robot assisted laparoscopic radical prostatectomy  06/04/2012    Procedure: ROBOTIC ASSISTED LAPAROSCOPIC RADICAL PROSTATECTOMY LEVEL 3;  Surgeon: Dutch Gray, MD;  Location: [facility];  Service: Urology;  Laterality: N/A;  . Lymphadenectomy  06/04/2012    Procedure: LYMPHADENECTOMY;  Surgeon: Dutch Gray, MD;  Location: [facility];  Service: Urology;  Laterality: Bilateral;  . Cystoscopy with retrograde pyelogram, ureteroscopy and stent placement Left 04/18/2014    Procedure: [place], URETEROSCOPY AND STENT PLACEMENT;  Surgeon: Raynelle Bring, MD;  Location: [facility];  Service: Urology;  Laterality: Left;  . Holmium laser application Left 2/70/3500    Procedure: HOLMIUM LASER APPLICATION;  Surgeon: Raynelle Bring, MD;  Location: [facility];  Service: Urology;  Laterality: Left;   History reviewed. No pertinent family history. History  Substance Use Topics  . Smoking status: Former Smoker -- 0.25 packs/day for 4 years     Types: Cigarettes    Quit date: 12/16/1978  . Smokeless tobacco: Never Used  . Alcohol Use: No    Review of Systems  Constitutional: Positive for fatigue. Negative for fever and chills.  Respiratory: Negative for cough, chest tightness and shortness of breath.   Cardiovascular: Negative for chest pain, palpitations and leg swelling.  Gastrointestinal: Positive for vomiting. Negative for nausea, abdominal pain, diarrhea and abdominal distention.  Genitourinary: Negative for dysuria, urgency, frequency and hematuria.  Musculoskeletal: Negative for myalgias, arthralgias, neck pain and neck stiffness.  Skin: Negative for rash.  Neurological: Positive for weakness. Negative for dizziness, light-headedness, numbness and headaches.  All other systems reviewed and are negative.     Allergies  Review of patient's allergies indicates no known allergies.  Home Medications   Prior to Admission medications   Medication Sig Start Date End Date Taking? Authorizing Provider  amLODipine (NORVASC) 10 MG tablet Take 10 mg by mouth daily.    Historical Provider, MD  ciprofloxacin (CIPRO) 500 MG tablet Take 500 mg by mouth 2 (two) times daily.    Historical Provider, MD  ciprofloxacin (CIPRO) 500 MG tablet Take 1 tablet (500 mg total) by mouth 2 (two) times daily. 04/18/14   Raynelle Bring, MD  glimepiride (AMARYL) 2 MG tablet Take 2 mg by mouth daily before breakfast.    Historical Provider, MD  HYDROcodone-acetaminophen (NORCO) 5-325 MG per tablet Take 1-2 tablets by mouth every 6 (six) hours as needed for pain. 06/04/12   Debbrah Alar, PA-C  HYDROcodone-acetaminophen (  NORCO/VICODIN) 5-325 MG per tablet Take 1-2 tablets by mouth every 6 (six) hours as needed. 04/18/14   Raynelle Bring, MD  indomethacin (INDOCIN SR) 75 MG CR capsule Take 75 mg by mouth daily at 10 pm.    Historical Provider, MD  Liraglutide (VICTOZA) 18 MG/3ML SOLN Inject 1.8 mg into the skin daily.    Historical Provider, MD  metFORMIN  (GLUCOPHAGE) 500 MG tablet Take 1,000 mg by mouth daily with breakfast.    Historical Provider, MD  naproxen (NAPROSYN) 500 MG tablet Take 500 mg by mouth 2 (two) times daily with a meal. Patient states takes only 1 daily    Historical Provider, MD  rosuvastatin (CRESTOR) 10 MG tablet Take 10 mg by mouth every morning.    Historical Provider, MD  saxagliptin HCl (ONGLYZA) 2.5 MG TABS tablet Take 5 mg by mouth daily.    Historical Provider, MD  traMADol (ULTRAM) 50 MG tablet Take 50 mg by mouth every 6 (six) hours as needed for moderate pain.    Historical Provider, MD   BP 85/37 mmHg  Pulse 92  Temp(Src)   Resp 18  SpO2 100% Physical Exam  Constitutional: He is oriented to person, place, and time. He appears well-developed and well-nourished.  Ill appearing, weak, trouble holding head up  HENT:  Head: Normocephalic and atraumatic.  Eyes: Conjunctivae and EOM are normal. Pupils are equal, round, and reactive to light.  Neck: Normal range of motion. Neck supple.  No meningismus  Cardiovascular: Normal rate, regular rhythm and normal heart sounds.   Pulmonary/Chest: Effort normal. No respiratory distress. He has no wheezes. He has no rales.  Abdominal: Soft. Bowel sounds are normal. He exhibits no distension. There is no tenderness. There is no rebound.  Musculoskeletal: He exhibits no edema.  Neurological: He is alert and oriented to person, place, and time.  Generalized weakness, lethargic. 3/5 upper and lower extremity weakness bilaterally  Skin: Skin is warm and dry.  Nursing note and vitals reviewed.   ED Course  Procedures (including critical care time) Labs Review Labs Reviewed  CBC WITH DIFFERENTIAL/PLATELET - Abnormal; Notable for the following:    WBC 13.4 (*)    Neutrophils Relative % 78 (*)    Neutro Abs 10.5 (*)    Monocytes Absolute 1.1 (*)    All other components within normal limits  URINALYSIS, ROUTINE W REFLEX MICROSCOPIC - Abnormal; Notable for the following:     APPearance CLOUDY (*)    Glucose, UA >1000 (*)    Hgb urine dipstick MODERATE (*)    Bilirubin Urine LARGE (*)    Ketones, ur 40 (*)    All other components within normal limits  COMPREHENSIVE METABOLIC PANEL - Abnormal; Notable for the following:    Potassium 5.4 (*)    CO2 9 (*)    Glucose, Bld 427 (*)    BUN 54 (*)    Creatinine, Ser 3.33 (*)    Total Bilirubin 1.8 (*)    GFR calc non Af Amer 18 (*)    GFR calc Af Amer 21 (*)    Anion gap 30 (*)    All other components within normal limits  CBG MONITORING, ED - Abnormal; Notable for the following:    Glucose-Capillary 367 (*)    All other components within normal limits  I-STAT ARTERIAL BLOOD GAS, ED - Abnormal; Notable for the following:    pH, Arterial 7.091 (*)    pCO2 arterial 25.1 (*)    pO2, Arterial  101.0 (*)    Bicarbonate 7.6 (*)    Acid-base deficit 21.0 (*)    All other components within normal limits  CBG MONITORING, ED - Abnormal; Notable for the following:    Glucose-Capillary 383 (*)    All other components within normal limits  CULTURE, BLOOD (ROUTINE X 2)  CULTURE, BLOOD (ROUTINE X 2)  URINE CULTURE  URINE MICROSCOPIC-ADD ON  I-STAT CG4 LACTIC ACID, ED  I-STAT CG4 LACTIC ACID, ED  Randolm Idol, ED    Imaging Review Dg Chest Portable 1 View  11/05/2014   CLINICAL DATA:  Hyperglycemia and emesis. History of diabetes and hypertension.  EXAM: PORTABLE CHEST - 1 VIEW  COMPARISON:  05/29/2012 and prior chest radiographs dating back to 07/08/2003  FINDINGS: The cardiomediastinal silhouette is unchanged.  Elevation of the right hemidiaphragm and right basilar scarring again noted.  There is no evidence of focal airspace disease, pulmonary edema, suspicious pulmonary nodule/mass, pleural effusion, or pneumothorax. No acute bony abnormalities are identified.  IMPRESSION: No active disease.   Electronically Signed   By: Margarette Canada M.D.   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 Critical care was necessary to treat or prevent imminent or life-threatening deterioration. Critical care was time spent personally by me on the following activities: development of treatment plan with patient and/or surrogate as well as nursing, discussions with consultants, evaluation of patient's response to treatment, examination of patient, obtaining history from patient or surrogate, ordering and performing treatments and interventions, ordering and review of laboratory studies, ordering and review of radiographic studies, pulse oximetry and re-evaluation of patient's condition.  MDM   Final diagnoses:  Type 2 diabetes mellitus with ketoacidosis without coma  Renal failure  Weakness  Hypotension, unspecified hypotension type     4:37 PM Pt with vomiting, weakness, dizziness for 3 days. Hypotensive on initial evaluatione 85/37, hr normal. Will get labs, IV fluids started, ECG and triop ordered. Pt is AAO, weak appearing - generalized.   6:08 PM BP now in 95J systolic.  Pt states he is feeling a little better. Rectal temp 95.5, barehugger applied. Pt is trendelenburg. Pt's pH 7.09,  insulin drip initiated. Ua negative for infection. Acute renal failure with creat 3.33, bun 54. Pt has no other complaints. No evidence of infection. Lactic acid normal. Blood cultures obtained.  Discussed with critical care. Will admit.   Filed Vitals:   11/05/14 2100 11/05/14 2200 11/05/14 2300 11/06/14 0000  BP: 101/56  108/51 106/52   Pulse: 95 94 97   Temp:    97.4 F (36.3 C)  TempSrc:    Oral  Resp: 14     Height:      Weight:      SpO2: 100% 99% 100%      Jeannett Senior, PA-C 11/06/14 0018  Ernestina Patches, MD 11/06/14 8841

## 2014-11-05 NOTE — ED Notes (Signed)
PA/Md notified of pt rectal temperature. Pt placed on bear hugger

## 2014-11-05 NOTE — ED Notes (Signed)
MD at bedside. 

## 2014-11-06 ENCOUNTER — Inpatient Hospital Stay (HOSPITAL_COMMUNITY): Payer: Medicare Other

## 2014-11-06 DIAGNOSIS — E1311 Other specified diabetes mellitus with ketoacidosis with coma: Secondary | ICD-10-CM

## 2014-11-06 LAB — BASIC METABOLIC PANEL WITH GFR
Anion gap: 13 (ref 5–15)
Anion gap: 9 (ref 5–15)
BUN: 27 mg/dL — ABNORMAL HIGH (ref 6–23)
BUN: 22 mg/dL (ref 6–23)
CO2: 20 mmol/L (ref 19–32)
CO2: 24 mmol/L (ref 19–32)
Calcium: 8.6 mg/dL (ref 8.4–10.5)
Calcium: 8.6 mg/dL (ref 8.4–10.5)
Chloride: 110 mmol/L (ref 96–112)
Chloride: 110 mmol/L (ref 96–112)
Creatinine, Ser: 1.85 mg/dL — ABNORMAL HIGH (ref 0.50–1.35)
Creatinine, Ser: 1.61 mg/dL — ABNORMAL HIGH (ref 0.50–1.35)
GFR calc Af Amer: 42 mL/min — ABNORMAL LOW
GFR calc Af Amer: 49 mL/min — ABNORMAL LOW
GFR calc non Af Amer: 36 mL/min — ABNORMAL LOW
GFR calc non Af Amer: 43 mL/min — ABNORMAL LOW
Glucose, Bld: 221 mg/dL — ABNORMAL HIGH (ref 70–99)
Glucose, Bld: 167 mg/dL — ABNORMAL HIGH (ref 70–99)
Potassium: 4.2 mmol/L (ref 3.5–5.1)
Potassium: 4 mmol/L (ref 3.5–5.1)
Sodium: 143 mmol/L (ref 135–145)
Sodium: 143 mmol/L (ref 135–145)

## 2014-11-06 LAB — BASIC METABOLIC PANEL
ANION GAP: 17 — AB (ref 5–15)
Anion gap: 16 — ABNORMAL HIGH (ref 5–15)
Anion gap: 14 (ref 5–15)
BUN: 48 mg/dL — ABNORMAL HIGH (ref 6–23)
BUN: 42 mg/dL — AB (ref 6–23)
BUN: 35 mg/dL — AB (ref 6–23)
CALCIUM: 7.8 mg/dL — AB (ref 8.4–10.5)
CALCIUM: 8.3 mg/dL — AB (ref 8.4–10.5)
CO2: 15 mmol/L — ABNORMAL LOW (ref 19–32)
CO2: 18 mmol/L — ABNORMAL LOW (ref 19–32)
CO2: 18 mmol/L — ABNORMAL LOW (ref 19–32)
CREATININE: 1.84 mg/dL — AB (ref 0.50–1.35)
Calcium: 8.6 mg/dL (ref 8.4–10.5)
Chloride: 107 mmol/L (ref 96–112)
Chloride: 108 mmol/L (ref 96–112)
Chloride: 111 mmol/L (ref 96–112)
Creatinine, Ser: 2.68 mg/dL — ABNORMAL HIGH (ref 0.50–1.35)
Creatinine, Ser: 2.17 mg/dL — ABNORMAL HIGH (ref 0.50–1.35)
GFR calc non Af Amer: 23 mL/min — ABNORMAL LOW (ref >90)
GFR calc non Af Amer: 30 mL/min — ABNORMAL LOW (ref >90)
GFR calc non Af Amer: 36 mL/min — ABNORMAL LOW (ref >90)
GFR, EST AFRICAN AMERICAN: 27 mL/min — AB (ref >90)
GFR, EST AFRICAN AMERICAN: 34 mL/min — AB (ref >90)
GFR, EST AFRICAN AMERICAN: 42 mL/min — AB (ref >90)
GLUCOSE: 157 mg/dL — AB (ref 70–99)
Glucose, Bld: 238 mg/dL — ABNORMAL HIGH (ref 70–99)
Glucose, Bld: 151 mg/dL — ABNORMAL HIGH (ref 70–99)
POTASSIUM: 4.6 mmol/L (ref 3.5–5.1)
Potassium: 5.6 mmol/L — ABNORMAL HIGH (ref 3.5–5.1)
Potassium: 5 mmol/L (ref 3.5–5.1)
SODIUM: 139 mmol/L (ref 135–145)
Sodium: 142 mmol/L (ref 135–145)
Sodium: 143 mmol/L (ref 135–145)

## 2014-11-06 LAB — GLUCOSE, CAPILLARY
GLUCOSE-CAPILLARY: 169 mg/dL — AB (ref 70–99)
GLUCOSE-CAPILLARY: 161 mg/dL — AB (ref 70–99)
GLUCOSE-CAPILLARY: 190 mg/dL — AB (ref 70–99)
GLUCOSE-CAPILLARY: 174 mg/dL — AB (ref 70–99)
GLUCOSE-CAPILLARY: 163 mg/dL — AB (ref 70–99)
GLUCOSE-CAPILLARY: 204 mg/dL — AB (ref 70–99)
GLUCOSE-CAPILLARY: 155 mg/dL — AB (ref 70–99)
GLUCOSE-CAPILLARY: 148 mg/dL — AB (ref 70–99)
GLUCOSE-CAPILLARY: 132 mg/dL — AB (ref 70–99)
Glucose-Capillary: 123 mg/dL — ABNORMAL HIGH (ref 70–99)
Glucose-Capillary: 130 mg/dL — ABNORMAL HIGH (ref 70–99)
Glucose-Capillary: 138 mg/dL — ABNORMAL HIGH (ref 70–99)
Glucose-Capillary: 140 mg/dL — ABNORMAL HIGH (ref 70–99)
Glucose-Capillary: 141 mg/dL — ABNORMAL HIGH (ref 70–99)
Glucose-Capillary: 150 mg/dL — ABNORMAL HIGH (ref 70–99)
Glucose-Capillary: 153 mg/dL — ABNORMAL HIGH (ref 70–99)
Glucose-Capillary: 155 mg/dL — ABNORMAL HIGH (ref 70–99)
Glucose-Capillary: 165 mg/dL — ABNORMAL HIGH (ref 70–99)
Glucose-Capillary: 169 mg/dL — ABNORMAL HIGH (ref 70–99)
Glucose-Capillary: 174 mg/dL — ABNORMAL HIGH (ref 70–99)
Glucose-Capillary: 176 mg/dL — ABNORMAL HIGH (ref 70–99)
Glucose-Capillary: 190 mg/dL — ABNORMAL HIGH (ref 70–99)
Glucose-Capillary: 208 mg/dL — ABNORMAL HIGH (ref 70–99)

## 2014-11-06 LAB — POCT I-STAT 3, ART BLOOD GAS (G3+)
ACID-BASE DEFICIT: 13 mmol/L — AB (ref 0.0–2.0)
Bicarbonate: 13.1 mEq/L — ABNORMAL LOW (ref 20.0–24.0)
O2 Saturation: 95 %
PH ART: 7.218 — AB (ref 7.350–7.450)
PO2 ART: 89 mmHg (ref 80.0–100.0)
Patient temperature: 98.6
TCO2: 14 mmol/L (ref 0–100)
pCO2 arterial: 32.2 mmHg — ABNORMAL LOW (ref 35.0–45.0)

## 2014-11-06 LAB — CBC
HEMATOCRIT: 41.7 % (ref 39.0–52.0)
Hemoglobin: 13.8 g/dL (ref 13.0–17.0)
MCH: 27.5 pg (ref 26.0–34.0)
MCHC: 33.1 g/dL (ref 30.0–36.0)
MCV: 83.1 fL (ref 78.0–100.0)
Platelets: 177 10*3/uL (ref 150–400)
RBC: 5.02 MIL/uL (ref 4.22–5.81)
RDW: 14.7 % (ref 11.5–15.5)
WBC: 11.5 10*3/uL — ABNORMAL HIGH (ref 4.0–10.5)

## 2014-11-06 LAB — BLOOD GAS, ARTERIAL
Acid-base deficit: 10.1 mmol/L — ABNORMAL HIGH (ref 0.0–2.0)
Bicarbonate: 15.5 mEq/L — ABNORMAL LOW (ref 20.0–24.0)
Drawn by: 41977
FIO2: 0.21 %
O2 Saturation: 97.4 %
Patient temperature: 98.6
TCO2: 16.6 mmol/L (ref 0–100)
pCO2 arterial: 35.2 mmHg (ref 35.0–45.0)
pH, Arterial: 7.268 — ABNORMAL LOW (ref 7.350–7.450)
pO2, Arterial: 93.5 mmHg (ref 80.0–100.0)

## 2014-11-06 LAB — MAGNESIUM: MAGNESIUM: 2.2 mg/dL (ref 1.5–2.5)

## 2014-11-06 LAB — PHOSPHORUS: PHOSPHORUS: 1.5 mg/dL — AB (ref 2.3–4.6)

## 2014-11-06 LAB — MRSA PCR SCREENING: MRSA by PCR: NEGATIVE

## 2014-11-06 MED ORDER — DEXTROSE-NACL 5-0.45 % IV SOLN
INTRAVENOUS | Status: DC
  Administered 2014-11-06: 125 mL/h via INTRAVENOUS
  Administered 2014-11-06 – 2014-11-07 (×2): via INTRAVENOUS

## 2014-11-06 MED ORDER — ONDANSETRON HCL 4 MG/2ML IJ SOLN
4.0000 mg | Freq: Three times a day (TID) | INTRAMUSCULAR | Status: AC | PRN
  Administered 2014-11-06: 4 mg via INTRAVENOUS
  Filled 2014-11-06: qty 2

## 2014-11-06 NOTE — Progress Notes (Signed)
Utilization review completed.  

## 2014-11-06 NOTE — Progress Notes (Signed)
Gram statin report indicated gram variable rods. Elink notified of lab result. No new orders at this time. Will continue to monitor.

## 2014-11-06 NOTE — Progress Notes (Signed)
PULMONARY  / CRITICAL CARE MEDICINE HISTORY AND PHYSICAL EXAMINATION  Name: Jay Holloway MRN: [id number] DOB: 16-Jan-1946    ADMISSION DATE:  11/05/2014  CHIEF COMPLAINT:  Weakness, abdominal pain  BRIEF PATIENT DESCRIPTION: 69 y/o man with type II diabetes, presenting with nausea/vomiting and weakness, labs consistent with DKA.  SIGNIFICANT EVENTS : 4/09 Admit  STUDIES:   SUBJECTIVE: Feels nauseous.  Denies abd pain.  PHYSICAL EXAM:  Temp:  [95.5 F (35.3 C)-97.5 F (36.4 C)] 97.5 F (36.4 C) (04/10 0400) Pulse Rate:  [88-104] 97 (04/10 0700) Resp:  [14-26] 26 (04/10 0700) BP: (85-132)/(34-67) 132/67 mmHg (04/10 0700) SpO2:  [95 %-100 %] 97 % (04/10 0700) Weight:  [273 lb 13 oz (124.2 kg)] 273 lb 13 oz (124.2 kg) (04/10 0500)   General:  Ill appearing Neuro: alert, follows commands HEENT:  Pupils reactive Cardiovascular: regular Lungs:  Clear bilaterally Abdomen:  Soft, nontender Musculoskeletal:  No edema Skin:  No rashes   Intake/Output      04/09 0701 - 04/10 0700 04/10 0701 - 04/11 0700   I.V. (mL/kg) 5687.7 (45.8)    IV Piggyback 50    Total Intake(mL/kg) 5737.7 (46.2)    Urine (mL/kg/hr) 2025    Total Output 2025     Net +3712.7            CLINICAL DECISION-MAKING:  CBC Recent Labs     11/05/14  1517  11/06/14  0411  WBC  13.4*  11.5*  HGB  14.2  13.8  HCT  43.7  41.7  PLT  207  177    BMET Recent Labs     11/05/14  1517  11/05/14  2305  11/06/14  0411  NA  135  139  142  K  5.4*  5.6*  4.6  CL  96  107  108  CO2  9*  15*  18*  BUN  54*  48*  42*  CREATININE  3.33*  2.68*  2.17*  GLUCOSE  427*  238*  157*    Electrolytes Recent Labs     11/05/14  1517  11/05/14  2305  11/06/14  0411  CALCIUM  9.0  7.8*  8.3*    ABG Recent Labs     11/05/14  2119 11/06/14  11/06/14  0335  PHART  7.140*  7.218*  7.268*  PCO2ART  27.4*  32.2*  35.2  PO2ART  96.0  89.0  93.5    Liver Enzymes Recent Labs     11/05/14  1517  AST  19   ALT  33  ALKPHOS  99  BILITOT  1.8*  ALBUMIN  4.0    Glucose Recent Labs     11/06/14  0203  11/06/14  0305  11/06/14  0412  11/06/14  0524  11/06/14  0610  11/06/14  0658  GLUCAP  155*  123*  141*  138*  169*  130*    Imaging Dg Chest Portable 1 View  11/05/2014   CLINICAL DATA:  Hyperglycemia and emesis. History of diabetes and hypertension.  EXAM: PORTABLE CHEST - 1 VIEW  COMPARISON:  05/29/2012 and prior chest radiographs dating back to 07/08/2003  FINDINGS: The cardiomediastinal silhouette is unchanged.  Elevation of the right hemidiaphragm and right basilar scarring again noted.  There is no evidence of focal airspace disease, pulmonary edema, suspicious pulmonary nodule/mass, pleural effusion, or pneumothorax. No acute bony abnormalities are identified.  IMPRESSION: No active disease.   Electronically Signed   By: Dellis Filbert  Hu M.D.   On: 11/05/2014 17:23    ASSESSMENT / PLAN:   NEUROLOGIC A: Acute encephalopathy 2nd to DKA. P: Monitor mental status  PULMONARY A: Kussmaul breathing from DKA >> improved. P: Monitor oxygenation  CARDIOVASCULAR A: Hypotension 2nd to hypovolemia from DKA. Hx of HTN, HLD. P: Continue IV fluids Hold outpt norvasc, avapro, crestor  RENAL A:  AKI 2nd to hypovolemia. Hematuria. Hx of prostate cancer. P: Monitor renal fx, urinue outpt, electrolytes   GASTROINTESTINAL A:  N/V with abd pain from DKA. P: Advance diet as tolerated PRN zofran  HEMATOLOGIC A: Leukocytosis >> improved. P: F/u cbc  INFECTIOUS DISEASE A:  Possible sepsis from UTI. P: Day 2 rocephin  Blood 4/09 >> Urine 4/09 >>  ENDOCRINE A:  DKA. P: Insulin gtt until gap closed D5 1/2 NS once blood sugar < 250 Hold outpt amaryl, victoza, metformin, onglyza  CC time 40 minutes.  Chesley Mires, MD [facility] Pulmonary/Critical Care 11/06/2014, 8:45 AM Pager:  [phone] After 3pm call: [phone]

## 2014-11-07 DIAGNOSIS — E0810 Diabetes mellitus due to underlying condition with ketoacidosis without coma: Secondary | ICD-10-CM

## 2014-11-07 DIAGNOSIS — A058 Other specified bacterial foodborne intoxications: Secondary | ICD-10-CM

## 2014-11-07 DIAGNOSIS — N390 Urinary tract infection, site not specified: Secondary | ICD-10-CM

## 2014-11-07 LAB — CBC
HCT: 39.9 % (ref 39.0–52.0)
Hemoglobin: 13.1 g/dL (ref 13.0–17.0)
MCH: 27.1 pg (ref 26.0–34.0)
MCHC: 32.8 g/dL (ref 30.0–36.0)
MCV: 82.6 fL (ref 78.0–100.0)
PLATELETS: 176 10*3/uL (ref 150–400)
RBC: 4.83 MIL/uL (ref 4.22–5.81)
RDW: 14.9 % (ref 11.5–15.5)
WBC: 9.4 10*3/uL (ref 4.0–10.5)

## 2014-11-07 LAB — CULTURE, BLOOD (ROUTINE X 2)

## 2014-11-07 LAB — GLUCOSE, CAPILLARY
GLUCOSE-CAPILLARY: 119 mg/dL — AB (ref 70–99)
GLUCOSE-CAPILLARY: 132 mg/dL — AB (ref 70–99)
GLUCOSE-CAPILLARY: 168 mg/dL — AB (ref 70–99)
GLUCOSE-CAPILLARY: 178 mg/dL — AB (ref 70–99)
GLUCOSE-CAPILLARY: 190 mg/dL — AB (ref 70–99)
GLUCOSE-CAPILLARY: 200 mg/dL — AB (ref 70–99)
Glucose-Capillary: 124 mg/dL — ABNORMAL HIGH (ref 70–99)
Glucose-Capillary: 196 mg/dL — ABNORMAL HIGH (ref 70–99)

## 2014-11-07 LAB — COMPREHENSIVE METABOLIC PANEL
ALBUMIN: 3.1 g/dL — AB (ref 3.5–5.2)
ALK PHOS: 78 U/L (ref 39–117)
ALT: 24 U/L (ref 0–53)
AST: 18 U/L (ref 0–37)
Anion gap: 8 (ref 5–15)
BUN: 14 mg/dL (ref 6–23)
CHLORIDE: 105 mmol/L (ref 96–112)
CO2: 29 mmol/L (ref 19–32)
CREATININE: 1.38 mg/dL — AB (ref 0.50–1.35)
Calcium: 8.6 mg/dL (ref 8.4–10.5)
GFR calc Af Amer: 60 mL/min — ABNORMAL LOW (ref >90)
GFR, EST NON AFRICAN AMERICAN: 51 mL/min — AB (ref >90)
Glucose, Bld: 183 mg/dL — ABNORMAL HIGH (ref 70–99)
POTASSIUM: 4 mmol/L (ref 3.5–5.1)
Sodium: 142 mmol/L (ref 135–145)
Total Bilirubin: 0.8 mg/dL (ref 0.3–1.2)
Total Protein: 6.1 g/dL (ref 6.0–8.3)

## 2014-11-07 LAB — PHOSPHORUS: Phosphorus: 1.7 mg/dL — ABNORMAL LOW (ref 2.3–4.6)

## 2014-11-07 LAB — MAGNESIUM: MAGNESIUM: 2.1 mg/dL (ref 1.5–2.5)

## 2014-11-07 MED ORDER — INSULIN ASPART 100 UNIT/ML ~~LOC~~ SOLN
0.0000 [IU] | Freq: Three times a day (TID) | SUBCUTANEOUS | Status: DC
  Administered 2014-11-07 – 2014-11-09 (×8): 3 [IU] via SUBCUTANEOUS
  Administered 2014-11-10 (×3): 5 [IU] via SUBCUTANEOUS
  Administered 2014-11-11: 15 [IU] via SUBCUTANEOUS
  Administered 2014-11-12 (×2): 8 [IU] via SUBCUTANEOUS
  Administered 2014-11-12: 5 [IU] via SUBCUTANEOUS
  Administered 2014-11-13 (×3): 3 [IU] via SUBCUTANEOUS

## 2014-11-07 MED ORDER — INSULIN ASPART 100 UNIT/ML ~~LOC~~ SOLN
0.0000 [IU] | SUBCUTANEOUS | Status: DC
  Administered 2014-11-07: 3 [IU] via SUBCUTANEOUS
  Administered 2014-11-07: 5 [IU] via SUBCUTANEOUS

## 2014-11-07 MED ORDER — INSULIN ASPART 100 UNIT/ML ~~LOC~~ SOLN
0.0000 [IU] | Freq: Every day | SUBCUTANEOUS | Status: DC
  Administered 2014-11-09: 2 [IU] via SUBCUTANEOUS
  Administered 2014-11-11: 3 [IU] via SUBCUTANEOUS

## 2014-11-07 MED ORDER — SODIUM CHLORIDE 0.9 % IV SOLN
1500.0000 mg | INTRAVENOUS | Status: DC
  Administered 2014-11-08: 1500 mg via INTRAVENOUS
  Filled 2014-11-07: qty 1500

## 2014-11-07 MED ORDER — SODIUM CHLORIDE 0.9 % IV SOLN
INTRAVENOUS | Status: DC
  Administered 2014-11-07: 02:00:00 via INTRAVENOUS

## 2014-11-07 MED ORDER — VANCOMYCIN HCL 10 G IV SOLR
2000.0000 mg | Freq: Once | INTRAVENOUS | Status: AC
  Administered 2014-11-07: 2000 mg via INTRAVENOUS
  Filled 2014-11-07: qty 2000

## 2014-11-07 MED ORDER — GI COCKTAIL ~~LOC~~
30.0000 mL | Freq: Once | ORAL | Status: AC
  Administered 2014-11-07: 30 mL via ORAL
  Filled 2014-11-07: qty 30

## 2014-11-07 NOTE — Progress Notes (Signed)
Inpatient Diabetes Program Recommendations  AACE/ADA: New Consensus Statement on Inpatient Glycemic Control (2013)  Target Ranges:  Prepandial:   less than 140 mg/dL      Peak postprandial:   less than 180 mg/dL (1-2 hours)      Critically ill patients:  140 - 180 mg/dL   Reason for Assessment: Admission with DKA  Diabetes history: Type 2  Outpatient Diabetes medications: Amaryl 2 mg, Victoza 1.8 ml daily, Metformin 1000 mg bid, Onglyza 5 mg daily Current orders for Inpatient glycemic control: Novolog Moderate correction tid, HS correction at [facility]  Results for REON, HUNLEY (MRN [id number]) as of 11/07/2014 15:22  Ref. Range 11/07/2014 07:51 11/07/2014 13:05  Glucose-Capillary Latest Range: 70-99 mg/dL 200 (H) 190 (H)    Note: Insulin drip stopped during night.  See CBG's since drip stopped.  If elevated CBG's persist request MD consider:  Add Lantus or Levemir 20 units daily while in hospital  Assess need for meal coverage while in hospital  Consider ordering A1C to assess glycemic control prior to admission  Thank you.  Jiovanni Heeter S. Marcelline Mates, RN, CNS, CDE Inpatient Diabetes Program, team pager [phone]

## 2014-11-07 NOTE — Progress Notes (Signed)
eLink Physician-Brief Progress Note Patient Name: Jay Holloway DOB: [birth year] MRN: [id number]   Date of Service  11/07/2014  HPI/Events of Note  AG closed this 4/10 afternoon, CO2 24. OK to transition off insulin gtt  eICU Interventions       Intervention Category Intermediate Interventions: Hyperglycemia - evaluation and treatment  Emrick Hensch S. 11/07/2014, 2:21 AM

## 2014-11-07 NOTE — Progress Notes (Signed)
PULMONARY  / CRITICAL CARE MEDICINE HISTORY AND PHYSICAL EXAMINATION  Name: Jay Holloway MRN: [id number] DOB: 1946-05-05    ADMISSION DATE:  11/05/2014  CHIEF COMPLAINT:  Weakness, abdominal pain  BRIEF PATIENT DESCRIPTION: 69 y/o man with type II diabetes, presenting with nausea/vomiting and weakness, labs consistent with DKA.  SIGNIFICANT EVENTS : 4/09 Admit  STUDIES:   SUBJECTIVE: Transitioned to subcutaneous insulin. Feels better this AM. No cp or abdominal pain.   PHYSICAL EXAM:  Temp:  [97.8 F (36.6 C)-98.7 F (37.1 C)] 98.7 F (37.1 C) (04/11 0300) Pulse Rate:  [79-110] 88 (04/11 0700) Resp:  [0-30] 15 (04/11 0700) BP: (93-147)/(37-75) 140/75 mmHg (04/11 0700) SpO2:  [90 %-100 %] 94 % (04/11 0700) Weight:  [269 lb 2.9 oz (122.1 kg)] 269 lb 2.9 oz (122.1 kg) (04/11 0430)   General: resting in bed, NAD HEENT: PERRL, EOMI, no scleral icterus Cardiac: RRR, no rubs, murmurs or gallops Pulm: clear to auscultation bilaterally, moving normal volumes of air Abd: soft, nontender, nondistended, BS present Ext: warm and well perfused, no pedal edema Neuro: alert and oriented X3, cranial nerves II-XII grossly intact  Intake/Output      04/10 0701 - 04/11 0700 04/11 0701 - 04/12 0700   P.O. 1440    I.V. (mL/kg) 2517.7 (20.6)    IV Piggyback 50    Total Intake(mL/kg) 4007.7 (32.8)    Urine (mL/kg/hr) 3075 (1)    Total Output 3075     Net +932.7            CLINICAL DECISION-MAKING:  CBC Recent Labs     11/05/14  1517  11/06/14  0411  11/07/14  0300  WBC  13.4*  11.5*  9.4  HGB  14.2  13.8  13.1  HCT  43.7  41.7  39.9  PLT  207  177  176    BMET Recent Labs     11/06/14  1302  11/06/14  1642  11/07/14  0300  NA  143  143  142  K  4.2  4.0  4.0  CL  110  110  105  CO2  20  24  29   BUN  27*  22  14  CREATININE  1.85*  1.61*  1.38*  GLUCOSE  221*  167*  183*    Electrolytes Recent Labs     11/06/14  1302  11/06/14  1642  11/07/14  0300  CALCIUM   8.6  8.6  8.6  MG   --   2.2  2.1  PHOS   --   1.5*  1.7*    ABG Recent Labs     11/05/14  2119 11/06/14  11/06/14  0335  PHART  7.140*  7.218*  7.268*  PCO2ART  27.4*  32.2*  35.2  PO2ART  96.0  89.0  93.5    Liver Enzymes Recent Labs     11/05/14  1517  11/07/14  0300  AST  19  18  ALT  33  24  ALKPHOS  99  78  BILITOT  1.8*  0.8  ALBUMIN  4.0  3.1*    Glucose Recent Labs     11/06/14  2200  11/06/14  2301  11/07/14  0004  11/07/14  0115  11/07/14  0158  11/07/14  0340  GLUCAP  148*  132*  132*  119*  124*  178*    Imaging US Renal  11/06/2014   CLINICAL DATA:  69 year old male with renal failure.  History of hypertension, diabetes and prostate cancer.  EXAM: RENAL/URINARY TRACT ULTRASOUND COMPLETE  COMPARISON:  None.  FINDINGS: Right Kidney:  Length: 13 cm. A 6 x 7.6 cm cyst extending off of the lower pole is identified. Echogenicity within normal limits. No solid mass or hydronephrosis visualized.  Left Kidney:  Length: 13.7 cm. Echogenicity within normal limits. A 2.6 x 2.2 cm mid renal cyst is present. A 1 cm echogenic area within the lower pole and 1.5 cm echogenic area within the mid left kidney are noted and could represent nonobstructing calculi.  Bladder:  Appears normal for degree of bladder distention.  IMPRESSION: Bilateral renal cysts and possible nonobstructing left renal calculi.  Normal appearing kidneys and bladder otherwise. No evidence of hydronephrosis.   Electronically Signed   By: Margarette Canada M.D.   On: 11/06/2014 11:57   Dg Chest Portable 1 View  11/05/2014   CLINICAL DATA:  Hyperglycemia and emesis. History of diabetes and hypertension.  EXAM: PORTABLE CHEST - 1 VIEW  COMPARISON:  05/29/2012 and prior chest radiographs dating back to 07/08/2003  FINDINGS: The cardiomediastinal silhouette is unchanged.  Elevation of the right hemidiaphragm and right basilar scarring again noted.  There is no evidence of focal airspace disease, pulmonary edema,  suspicious pulmonary nodule/mass, pleural effusion, or pneumothorax. No acute bony abnormalities are identified.  IMPRESSION: No active disease.   Electronically Signed   By: Margarette Canada M.D.   On: 11/05/2014 17:23    ASSESSMENT / PLAN:   NEUROLOGIC A: Acute encephalopathy 2nd to DKA. P: Monitor mental status Ambulate today   PULMONARY A: Kussmaul breathing from DKA >> resolved P: Monitor oxygenation  CARDIOVASCULAR A: Hypotension 2nd to hypovolemia from DKA. Hx of HTN, HLD. P: Continue IV fluids Hold outpt norvasc, avapro, crestor  RENAL A:  AKI 2nd to hypovolemia- resolving . Hematuria. Hx of prostate cancer. P: Monitor renal fx, urinue outpt, electrolytes   GASTROINTESTINAL A:  Abdominal pain resolved P: Carb mod diet  PRN zofran  HEMATOLOGIC A: Leukocytosis >> improved. P: F/u cbc  INFECTIOUS DISEASE A:   sepsis from UTI complicated by GNR bacteremia. P: Day 3 rocephin  Blood 4/09 >>GNR awaiting speciation Urine 4/09 >>  ENDOCRINE A:  DKA resolved  P: Carb mod diet SSI Hold outpt amaryl, victoza, metformin, onglyza  Summary: Pt initally with DKA AMS and AKI 2/2 GNR bacteremia improving. Tolerating diet will advance to carb mod and ambulate. Plan to move to tele.   Clinton Gallant, MD IM PGY-3 Pgr: [phone]   Attending:  I have seen and examined the patient with nurse practitioner/resident and agree with the note above.   Mr. Canal looks great today.  On my exam he is weak but lungs clear, abdomen benign, good spirits  I'm concerned about the multiple bacterial organisms> b cereus in blood is a red flag for colon mass, does he need a colonoscopy?  Is this related to his prostate cancer?  Plan vanc for CONS in urine and levaquin for bacillus species, will f/u repeat blood cultures done later in day on 4/9;  DKA appears resolved, advance diet, change to sub q insulin per protocol  Transfer to floor, TRH service  Roselie Awkward,  MD [facility] Pager: [phone] Cell: [phone] If no response, call [phone]

## 2014-11-07 NOTE — Evaluation (Signed)
Physical Therapy Evaluation Patient Details Name: Jay Holloway MRN: [id number] DOB: 1945/12/17 Today's Date: 11/07/2014   History of Present Illness  Patient is a 69 y/o male with PMH of type II diabetes, HTN and prostate ca presenting with nausea/vomiting and weakness. Labs consistent with DKA. Admitted with DKA, AMS and AKI secondary to bacteremia. Renal US- B renal cysts and  nonobstructing left renal caliculi .    Clinical Impression  Patient presents with generalized weakness, balance deficits and fatigue impacting mobility. Might benefit from AD for short periods to improve balance and endurance during activity. Pt lives alone however reports he can have his lady friend stay with him if needed for short period. Would benefit from skilled PT to improve gait, balance, endurance and mobility so pt can maximize independence and return to PLOF.    Follow Up Recommendations Home health PT;Supervision/Assistance - 24 hour    Equipment Recommendations  Other (comment) (TBD)    Recommendations for Other Services OT consult     Precautions / Restrictions Precautions Precautions: Fall Restrictions Weight Bearing Restrictions: No      Mobility  Bed Mobility               General bed mobility comments: Sitting in chair upon PT arrival.   Transfers Overall transfer level: Needs assistance Equipment used: None Transfers: Sit to/from Stand Sit to Stand: Min guard         General transfer comment: Min guard for safety as pt mildly unsteady upon standing.  Ambulation/Gait Ambulation/Gait assistance: Min guard Ambulation Distance (Feet): 40 Feet Assistive device: None Gait Pattern/deviations: Step-through pattern;Decreased stride length;Drifts right/left   Gait velocity interpretation: Below normal speed for age/gender General Gait Details: Pt furniture walking within room for support. Fatigues easily. No dyspnea present however difficulty getting Sa02 reading. Possibly  dropped to 80s on RA? Might benefit from AD.  Stairs            Wheelchair Mobility    Modified Rankin (Stroke Patients Only)       Balance Overall balance assessment: Needs assistance Sitting-balance support: Feet supported;No upper extremity supported Sitting balance-Leahy Scale: Good     Standing balance support: During functional activity Standing balance-Leahy Scale: Fair Standing balance comment: Static standing balance does not require external support however dynamic standing - pt reaching for furniture to assist with balance.                              Pertinent Vitals/Pain Pain Assessment: No/denies pain    Home Living Family/patient expects to be discharged to:: Private residence Living Arrangements: Alone Available Help at Discharge: Friend(s);Available PRN/intermittently Type of Home: House Home Access: Stairs to enter Entrance Stairs-Rails: Right Entrance Stairs-Number of Steps: 4 Home Layout: One level Home Equipment: None Additional Comments: Pt reports he has a lady friend who can staywith him at home if needed.    Prior Function Level of Independence: Independent         Comments: Very active PTA. Mows lawn, drives. Manages blood glucose.     Hand Dominance        Extremity/Trunk Assessment   Upper Extremity Assessment: Defer to OT evaluation           Lower Extremity Assessment: Generalized weakness         Communication   Communication: No difficulties  Cognition Arousal/Alertness: Awake/alert Behavior During Therapy: WFL for tasks assessed/performed Overall Cognitive Status: Within Functional Limits for tasks  assessed                      General Comments      Exercises        Assessment/Plan    PT Assessment Patient needs continued PT services  PT Diagnosis Difficulty walking;Generalized weakness   PT Problem List Decreased strength;Cardiopulmonary status limiting activity;Decreased  balance;Decreased mobility;Decreased activity tolerance  PT Treatment Interventions Balance training;Gait training;Stair training;Patient/family education;Functional mobility training;Therapeutic activities;Therapeutic exercise;DME instruction   PT Goals (Current goals can be found in the Care Plan section) Acute Rehab PT Goals Patient Stated Goal: none stated PT Goal Formulation: With patient Time For Goal Achievement: 11/21/14 Potential to Achieve Goals: Fair    Frequency Min 3X/week   Barriers to discharge Decreased caregiver support Pt lives alone however cna have someone stay with him if needed.    Co-evaluation               End of Session Equipment Utilized During Treatment: Gait belt Activity Tolerance: Patient limited by fatigue Patient left: in chair;with call bell/phone within reach Nurse Communication: Mobility status         Time: 1150-1205 PT Time Calculation (min) (ACUTE ONLY): 15 min   Charges:   PT Evaluation $Initial PT Evaluation Tier I: 1 Procedure     PT G CodesCandy Sledge A 12-02-14, 12:14 PM Candy Sledge, PT, DPT [phone]

## 2014-11-07 NOTE — Progress Notes (Signed)
ANTIBIOTIC CONSULT NOTE - INITIAL  Pharmacy Consult:  Vancomycin / Levaquin Indication:  CoNS UTI + bacillus bacteremia  No Known Allergies  Patient Measurements: Height: 6\' 4"  (193 cm) Weight: 269 lb 2.9 oz (122.1 kg) IBW/kg (Calculated) : 86.8  Vital Signs: Temp: 99 F (37.2 C) (04/11 1056) Temp Source: Oral (04/11 1056) BP: 131/75 mmHg (04/11 1056) Pulse Rate: 102 (04/11 1056) Intake/Output from previous day: 04/10 0701 - 04/11 0700 In: 4007.7 [P.O.:1440; I.V.:2517.7; IV Piggyback:50] Out: 6789 [Urine:3075] Intake/Output from this shift: Total I/O In: 260 [P.O.:240; I.V.:20] Out: 300 [Urine:300]  Labs:  Recent Labs  11/05/14 1517  11/06/14 0411  11/06/14 1302 11/06/14 1642 11/07/14 0300  WBC 13.4*  --  11.5*  --   --   --  9.4  HGB 14.2  --  13.8  --   --   --  13.1  PLT 207  --  177  --   --   --  176  CREATININE 3.33*  < > 2.17*  < > 1.85* 1.61* 1.38*  < > = values in this interval not displayed. Estimated Creatinine Clearance: 74.1 mL/min (by C-G formula based on Cr of 1.38). No results for input(s): VANCOTROUGH, VANCOPEAK, VANCORANDOM, GENTTROUGH, GENTPEAK, GENTRANDOM, TOBRATROUGH, TOBRAPEAK, TOBRARND, AMIKACINPEAK, AMIKACINTROU, AMIKACIN in the last 72 hours.   Microbiology: Recent Results (from the past 720 hour(s))  Blood culture (routine x 2)     Status: None   Collection Time: 11/05/14  4:54 PM  Result Value Ref Range Status   Specimen Description BLOOD LEFT HAND  Final   Special Requests BOTTLES DRAWN AEROBIC AND ANAEROBIC 4CC  Final   Culture   Final    BACILLUS SPECIES Note: Standardized susceptibility testing for this organism is not available. Note: Gram Stain Report Called to,Read Back By and Verified With: JESSIE CLEAVER ON 4.10.16 @ 3810 BY [place] Performed at [facility]    Report Status 11/07/2014 FINAL  Final  Urine culture     Status: None (Preliminary result)   Collection Time: 11/05/14  5:12 PM  Result Value Ref Range  Status   Specimen Description URINE, RANDOM  Final   Special Requests ADDED 2354  Final   Colony Count   Final    40,000 COLONIES/ML Performed at [facility]    Culture   Final    STAPHYLOCOCCUS SPECIES (COAGULASE NEGATIVE) Note: RIFAMPIN AND GENTAMICIN SHOULD NOT BE USED AS SINGLE DRUGS FOR TREATMENT OF STAPH INFECTIONS. Performed at [facility]    Report Status PENDING  Incomplete  MRSA PCR Screening     Status: None   Collection Time: 11/05/14  8:00 PM  Result Value Ref Range Status   MRSA by PCR NEGATIVE NEGATIVE Final    Comment:        The GeneXpert MRSA Assay (FDA approved for NASAL specimens only), is one component of a comprehensive MRSA colonization surveillance program. It is not intended to diagnose MRSA infection nor to guide or monitor treatment for MRSA infections.   Blood culture (routine x 2)     Status: None (Preliminary result)   Collection Time: 11/05/14  8:34 PM  Result Value Ref Range Status   Specimen Description BLOOD RIGHT HAND  Final   Special Requests BOTTLES DRAWN AEROBIC AND ANAEROBIC 1 CC  Final   Culture   Final           BLOOD CULTURE RECEIVED NO GROWTH TO DATE CULTURE WILL BE HELD FOR 5 DAYS BEFORE ISSUING  A FINAL NEGATIVE REPORT Performed at [facility]    Report Status PENDING  Incomplete    Medical History: Past Medical History  Diagnosis Date  . Hypertension   . Diabetes mellitus without complication   . Hemorrhoids   . High cholesterol   . Arthritis     trouble turning head side to side and up  . Prostate cancer       Assessment: 87 YOM admitted with DKA and started on Rocephin for possible UTI.  Now to broaden to vancomycin for CoNS UTI and possible bacillus spp bacteremia.  Patient's renal function is improving.  He remains afebrile and his WBC is WNL.  Vanc 4/11 >> CTX 4/9 >> 4/10  4/9 UCx - CoNS (40K c/mL) 4/9 BCx x2 - bacillus spp (1 of 2)   Goal of Therapy:  Vancomycin trough level  15-20 mcg/ml   Plan:  - Vanc 2gm IV x 1, then 1500mg  IV Q24H - Monitor renal fxn, clinical progress, vanc trough as indicated - F/U abx de-escalation plan    Jerad Dunlap D. Mina Marble, PharmD, BCPS Pager:  [phone] 11/07/2014, 11:39 AM

## 2014-11-08 DIAGNOSIS — IMO0001 Reserved for inherently not codable concepts without codable children: Secondary | ICD-10-CM | POA: Insufficient documentation

## 2014-11-08 DIAGNOSIS — R7881 Bacteremia: Secondary | ICD-10-CM

## 2014-11-08 DIAGNOSIS — N19 Unspecified kidney failure: Secondary | ICD-10-CM | POA: Diagnosis present

## 2014-11-08 DIAGNOSIS — R531 Weakness: Secondary | ICD-10-CM

## 2014-11-08 DIAGNOSIS — E1310 Other specified diabetes mellitus with ketoacidosis without coma: Secondary | ICD-10-CM

## 2014-11-08 DIAGNOSIS — I959 Hypotension, unspecified: Secondary | ICD-10-CM

## 2014-11-08 DIAGNOSIS — R319 Hematuria, unspecified: Secondary | ICD-10-CM

## 2014-11-08 DIAGNOSIS — E1110 Type 2 diabetes mellitus with ketoacidosis without coma: Secondary | ICD-10-CM | POA: Diagnosis present

## 2014-11-08 DIAGNOSIS — A419 Sepsis, unspecified organism: Principal | ICD-10-CM

## 2014-11-08 DIAGNOSIS — Z794 Long term (current) use of insulin: Secondary | ICD-10-CM | POA: Diagnosis present

## 2014-11-08 DIAGNOSIS — N390 Urinary tract infection, site not specified: Secondary | ICD-10-CM | POA: Diagnosis present

## 2014-11-08 LAB — GLUCOSE, CAPILLARY
Glucose-Capillary: 187 mg/dL — ABNORMAL HIGH (ref 70–99)
Glucose-Capillary: 163 mg/dL — ABNORMAL HIGH (ref 70–99)
Glucose-Capillary: 160 mg/dL — ABNORMAL HIGH (ref 70–99)
Glucose-Capillary: 182 mg/dL — ABNORMAL HIGH (ref 70–99)

## 2014-11-08 LAB — URINE CULTURE: Colony Count: 40000

## 2014-11-08 MED ORDER — INSULIN GLARGINE 100 UNIT/ML ~~LOC~~ SOLN
20.0000 [IU] | Freq: Every day | SUBCUTANEOUS | Status: DC
  Administered 2014-11-08 – 2014-11-11 (×2): 20 [IU] via SUBCUTANEOUS
  Filled 2014-11-08 (×5): qty 0.2

## 2014-11-08 MED ORDER — CEFAZOLIN SODIUM 1-5 GM-% IV SOLN
1.0000 g | Freq: Four times a day (QID) | INTRAVENOUS | Status: DC
  Administered 2014-11-09: 1 g via INTRAVENOUS
  Filled 2014-11-08 (×2): qty 50

## 2014-11-08 MED ORDER — PEG 3350-KCL-NA BICARB-NACL 420 G PO SOLR
4000.0000 mL | Freq: Once | ORAL | Status: AC
  Administered 2014-11-08: 4000 mL via ORAL
  Filled 2014-11-08: qty 4000

## 2014-11-08 MED ORDER — SODIUM CHLORIDE 0.9 % IV SOLN: INTRAVENOUS | Status: DC

## 2014-11-08 NOTE — Evaluation (Signed)
Occupational Therapy Evaluation Patient Details Name: Jay Holloway MRN: [id number] DOB: [birth year] Today's Date: 11/08/2014    History of Present Illness Patient is a 69 y/o male with PMH of type II diabetes, HTN and prostate ca presenting with nausea/vomiting and weakness. Labs consistent with DKA. Admitted with DKA, AMS and AKI secondary to bacteremia. Renal US- B renal cysts and  nonobstructing left renal caliculi .   Clinical Impression   Pt with decline in function and safety with ADLs and ADL mobility with decrease balance and endurance and would benefit from acute OT services to address impairments to increase level of function and safety. Pt fatigues easily and  A little unsteady after sit - stand initiating ambulation. Pt lives at home alone and states that he has a friend that will assist him    Follow Up Recommendations  No OT follow up;Supervision - Intermittent    Equipment Recommendations  None recommended by OT    Recommendations for Other Services       Precautions / Restrictions Precautions Precautions: Fall Restrictions Weight Bearing Restrictions: No      Mobility Bed Mobility Overal bed mobility: Needs Assistance Bed Mobility: Supine to Sit     Supine to sit: Supervision        Transfers Overall transfer level: Independent Equipment used: None Transfers: Sit to/from Stand Sit to Stand: Min guard              Balance   Sitting-balance support: No upper extremity supported;Feet supported Sitting balance-Leahy Scale: Good     Standing balance support: During functional activity;No upper extremity supported Standing balance-Leahy Scale: Fair                              ADL Overall ADL's : Needs assistance/impaired     Grooming: Wash/dry hands;Wash/dry face;Supervision/safety   Upper Body Bathing: Set up;Sitting   Lower Body Bathing: Set up;Sit to/from stand;Sitting/lateral leans;Min guard   Upper Body Dressing :  Sitting   Lower Body Dressing: Set up;Min guard;Sitting/lateral leans;Sit to/from stand   Toilet Transfer: Min guard;Regular Toilet;Grab bars;Ambulation   Toileting- Clothing Manipulation and Hygiene: Min guard;Sit to/from stand   Tub/ Banker: Min guard;Grab bars   Functional mobility during ADLs: Min guard       Vision  reading glasses, no change from baseline   Perception Perception Perception Tested?: No   Praxis Praxis Praxis tested?: Not tested    Pertinent Vitals/Pain Pain Assessment: 0-10 Pain Score: 5  Pain Location: upper abdominal area Pain Descriptors / Indicators: Sore Pain Intervention(s): Monitored during session;Repositioned     Hand Dominance Right   Extremity/Trunk Assessment Upper Extremity Assessment Upper Extremity Assessment: Generalized weakness   Lower Extremity Assessment Lower Extremity Assessment: Defer to PT evaluation   Cervical / Trunk Assessment Cervical / Trunk Assessment: Normal   Communication Communication Communication: No difficulties   Cognition Arousal/Alertness: Awake/alert Behavior During Therapy: WFL for tasks assessed/performed Overall Cognitive Status: Within Functional Limits for tasks assessed                     General Comments   pt pleasant and cooperative, fatigues easily                 Home Living Family/patient expects to be discharged to:: Private residence Living Arrangements: Alone Available Help at Discharge: Friend(s);Available PRN/intermittently Type of Home: House Home Access: Stairs to enter [employer] of Steps: 4 Entrance  Stairs-Rails: Right Home Layout: One level     Bathroom Shower/Tub: Teacher, early years/pre: Handicapped height     Home Equipment: None   Additional Comments: Pt reports he has a lady friend who can stay with him at home if needed.      Prior Functioning/Environment Level of Independence: Independent         Comments: Very active PTA. Mows lawn, drives. Manages blood glucose.    OT Diagnosis: Generalized weakness;Acute pain   OT Problem List: Decreased strength;Decreased activity tolerance;Impaired balance (sitting and/or standing);Pain;Decreased knowledge of use of DME or AE   OT Treatment/Interventions: Self-care/ADL training;Therapeutic exercise;Patient/family education;Therapeutic activities;DME and/or AE instruction    OT Goals(Current goals can be found in the care plan section) Acute Rehab OT Goals Patient Stated Goal: get better and go home OT Goal Formulation: With patient Time For Goal Achievement: 11/08/14 Potential to Achieve Goals: Good ADL Goals Pt Will Perform Lower Body Bathing: with supervision;with set-up;sitting/lateral leans;sit to/from stand Pt Will Perform Lower Body Dressing: with supervision;with set-up;sitting/lateral leans;sit to/from stand Pt Will Transfer to Toilet: with supervision;with modified independence;ambulating;regular height toilet;grab bars Pt Will Perform Toileting - Clothing Manipulation and hygiene: with supervision;with modified independence;sit to/from stand Pt Will Perform Tub/Shower Transfer: with supervision;with modified independence Additional ADL Goal #1: HEP to increase acitivity tolerance and endurance for ADLs and functional tasks  OT Frequency: Min 2X/week   Barriers to D/C:    none                     End of Session Equipment Utilized During Treatment: Gait belt  Activity Tolerance: Patient tolerated treatment well;Patient limited by fatigue Patient left: in bed;with call bell/phone within reach   Time: 1030-1049 OT Time Calculation (min): 19 min Charges:  OT General Charges $OT Visit: 1 Procedure OT Evaluation $Initial OT Evaluation Tier I: 1 Procedure OT Treatments $Self Care/Home Management : 8-22 mins G-Codes:    Britt Bottom 11/08/2014, 11:24 AM

## 2014-11-08 NOTE — Consult Note (Signed)
Reason for Consult: Bacillus bacteremia and Coag neg Staph in urine Referring Physician: [facility]  Anees Schrodt HPI: This is a 69 year old male with a PMH of DM, s/p prostate ca treatment, and HTN who was admitted with DKA.  Per his report he usually has good control of his diabetes, but then he started to have problems controlling his blood sugar before admission.  Insulin was not able bring down his blood sugar and upon presentation he was very weak and dehydrated.  His blood sugar was in the 400 range and he was also in ARF.  Further evaluation revealed that he was in DKA.  Fortunately with an insulin drip his gap closed and he felt much better.  Blood cultures and a urine culture were obtained upon admission.  The blood culture revealed a bacillus species and the urine was positive for coagulase negative staph.  His last colonoscopy was on 08/06/2010 and polyps were identified, which were significant for tubular adenomas.  Past Medical History  Diagnosis Date  . Hypertension   . Diabetes mellitus without complication   . Hemorrhoids   . High cholesterol   . Arthritis     trouble turning head side to side and up  . Prostate cancer     Past Surgical History  Procedure Laterality Date  . Right  achilles tendon repair  yrs ago  . Robot assisted laparoscopic radical prostatectomy  06/04/2012    Procedure: ROBOTIC ASSISTED LAPAROSCOPIC RADICAL PROSTATECTOMY LEVEL 3;  Surgeon: Dutch Gray, MD;  Location: [facility];  Service: Urology;  Laterality: N/A;  . Lymphadenectomy  06/04/2012    Procedure: LYMPHADENECTOMY;  Surgeon: Dutch Gray, MD;  Location: [facility];  Service: Urology;  Laterality: Bilateral;  . Cystoscopy with retrograde pyelogram, ureteroscopy and stent placement Left 04/18/2014    Procedure: [place], URETEROSCOPY AND STENT PLACEMENT;  Surgeon: Raynelle Bring, MD;  Location: [facility];  Service: Urology;  Laterality: Left;  . Holmium laser application Left  6/43/3295    Procedure: HOLMIUM LASER APPLICATION;  Surgeon: Raynelle Bring, MD;  Location: [facility];  Service: Urology;  Laterality: Left;    History reviewed. No pertinent family history.  Social History:  reports that he quit smoking about 35 years ago. His smoking use included Cigarettes. He has a 1 pack-year smoking history. He has never used smokeless tobacco. He reports that he does not drink alcohol or use illicit drugs.  Allergies: No Known Allergies  Medications:  Scheduled: . antiseptic oral rinse  7 mL Mouth Rinse q12n4p  . chlorhexidine  15 mL Mouth Rinse BID  . heparin  5,000 Units Subcutaneous 3 times per day  . insulin aspart  0-15 Units Subcutaneous TID WC  . insulin aspart  0-5 Units Subcutaneous QHS  . insulin glargine  20 Units Subcutaneous Daily  . vancomycin  1,500 mg Intravenous Q24H   Continuous: . sodium chloride Stopped (11/08/14 0010)    Results for orders placed or performed during the hospital encounter of 11/05/14 (from the past 24 hour(s))  Glucose, capillary     Status: Abnormal   Collection Time: 11/07/14  5:27 PM  Result Value Ref Range   Glucose-Capillary 168 (H) 70 - 99 mg/dL  Glucose, capillary     Status: Abnormal   Collection Time: 11/07/14  9:46 PM  Result Value Ref Range   Glucose-Capillary 196 (H) 70 - 99 mg/dL   Comment 1 Notify RN   Glucose, capillary     Status: Abnormal  Collection Time: 11/08/14  8:20 AM  Result Value Ref Range   Glucose-Capillary 187 (H) 70 - 99 mg/dL     No results found.  ROS:  As stated above in the HPI otherwise negative.  Blood pressure 115/66, pulse 106, temperature 98.7 F (37.1 C), temperature source Oral, resp. rate 17, height 6\' 4"  (1.93 m), weight 123.106 kg (271 lb 6.4 oz), SpO2 97 %.    PE: Gen: Lying on his right side, uncomfortable. HEENT:  [place]/AT, EOMI Neck: Supple, no LAD Lungs: CTA Bilaterally CV: RRR without M/G/R ABM: Soft, NTND, +BS Ext: No C/C/E  Assessment/Plan: 1) Bacillus  species bacteremia. 2) Coag neg. Staph UTI. 3) Personal history of polyps. 4) DM.   With the bacillus species further evaluation with a colonoscopy is warranted.  He complains of some lower abdominal pain at this time.  At the time of admission he states that he has had the pain and it improved with the correction of his blood sugar, but it is still bothersome for him.  I was not able to elicit any abdominal pain with the physical examination.  Plan: 1) Colonoscopy tomorrow with Dr. Collene Mares. 2) Continue with antibiotics.  Niambi Smoak D 11/08/2014, 1:22 PM

## 2014-11-08 NOTE — Progress Notes (Signed)
[facility]                                                                              Patient Demographics  Jay Holloway, is a 69 y.o. male, DOB - June 05, 1946, ZOX:[id number]  Admit date - 11/05/2014   Admitting Physician Chesley Mires, MD  Outpatient Primary MD for the patient is [name], MD  LOS - 3   Chief Complaint  Patient presents with  . Hyperglycemia  . Emesis  . Diabetic Ketoacidosis      Interim history 69 year old with history of type 2 diabetes mellitus, percent with nausea, vomiting, weakness, and found to have DKA. Patient was initially admitted by PCCM, as he was found to be acidotic, have hypotension, with Kussmal respirations.  Patient improved and was sent to medical floor. DKA has improved. HAI has improved. Patient also found to have bacteremia, sepsis secondary to urinary tract infection, currently on vancomycin. PT recommended home health therapy. Diabetes coordinator consulted. Hemoglobin A1c pending.   Assessment & Plan   Sepsis secondary to UTI vs Bacteremia -Upon admission, patient did have leukocytosis with tachycardia. -Blood cultures from 11/05/2014: Bacillus species (1/2) -Urine culture showed Staphylococcus, coag negative -Patient currently on vancomycin -Could this be related to GI source, patient likely needs a colonoscopy -Patient had a colonoscopy in 2012 and is due for one in Jan 2017 (spoke with Dr. Collene Mares), Dr. Benson Norway will see the patient  Diabetic ketoacidosis/diabetes mellitus -Per documentation, patient had Kussmal respiration  -Resolved, gap has closed. -Hemoglobin A1c pending Diabetes coordinator consulted -Home meds held: victoza, amaryl, metformin, onglyza  Hypotension -Resolved, likely secondary to above processes  Acute kidney injury -Likely secondary to DKA versus sepsis versus hypotension -Renal ultrasound: No hydronephrosis, nonobstructive left caliculi  Hematuria/history of prostate cancer -Will need to  follow up with urologist at discharge -Hemoglobin currently stable  Abdominal pain, nausea, vomiting -Resolved, likely secondary to DKA  Leukocytosis -Likely secondary to DKA vs sepsis -Resolved  Code Status: Full  Family Communication: None at bedside (patient lives alone)  Disposition Plan: Admitted.  Pending HbA1c to establish DM medication regimen, and GI consult.   Time Spent in minutes   35 minutes  Procedures  Renal US  Consults   PCCM Gastroenterology ([place])  DVT Prophylaxis  Heparin  Lab Results  Component Value Date   PLT 176 11/07/2014    Medications  Scheduled Meds: . antiseptic oral rinse  7 mL Mouth Rinse q12n4p  . chlorhexidine  15 mL Mouth Rinse BID  . heparin  5,000 Units Subcutaneous 3 times per day  . insulin aspart  0-15 Units Subcutaneous TID WC  . insulin aspart  0-5 Units Subcutaneous QHS  . insulin glargine  20 Units Subcutaneous Daily  . vancomycin  1,500 mg Intravenous Q24H   Continuous Infusions: . sodium chloride Stopped (11/08/14 0010)   PRN Meds:.  Antibiotics    Anti-infectives    Start     Dose/Rate Route Frequency Ordered Stop   11/08/14 1300  vancomycin (VANCOCIN) 1,500 mg in sodium chloride 0.9 % 500 mL IVPB     1,500 mg 250 mL/hr over 120 Minutes Intravenous Every 24 hours 11/07/14 1142  11/07/14 1145  vancomycin (VANCOCIN) 2,000 mg in sodium chloride 0.9 % 500 mL IVPB     2,000 mg 250 mL/hr over 120 Minutes Intravenous  Once 11/07/14 1142 11/07/14 1520   11/05/14 2100  cefTRIAXone (ROCEPHIN) 1 g in dextrose 5 % 50 mL IVPB - Premix  Status:  Discontinued     1 g 100 mL/hr over 30 Minutes Intravenous Every 24 hours 11/05/14 1952 11/07/14 1003        Subjective:   Jay Holloway seen and examined today.  Patient states he is feeling better.  He inquires about what medications he should take for his diabetes. Patient currently denies chest pain, shortness of breath, abdominal pain, nausea, vomiting, diarrhea.  Patient inquires about going home with therapy.  Objective:   Filed Vitals:   11/07/14 1419 11/07/14 2146 11/08/14 0500 11/08/14 0556  BP: 132/83 104/60  115/66  Pulse: 99 74  106  Temp: 99.1 F (37.3 C) 98.8 F (37.1 C)  98.7 F (37.1 C)  TempSrc: Oral Axillary  Oral  Resp: 18 17  17   Height:      Weight:   123.106 kg (271 lb 6.4 oz)   SpO2: 98% 96%  97%    Wt Readings from Last 3 Encounters:  11/08/14 123.106 kg (271 lb 6.4 oz)  04/18/14 139.056 kg (306 lb 9 oz)  06/04/12 146.512 kg (323 lb)     Intake/Output Summary (Last 24 hours) at 11/08/14 1201 Last data filed at 11/08/14 0820  Gross per 24 hour  Intake    860 ml  Output   2850 ml  Net  -1990 ml    Exam  General: Well developed, well nourished, NAD, appears stated age  6: NCAT,mucous membranes moist.   Cardiovascular: S1 S2 auscultated, no rubs, murmurs or gallops. Regular rate and rhythm.  Respiratory: Clear to auscultation bilaterally with equal chest rise  Abdomen: Soft, nontender, nondistended, + bowel sounds  Extremities: warm dry without cyanosis clubbing or edema  Neuro: AAOx3, nonfocal  Psych: Normal affect and demeanor   Data Review   Micro Results Recent Results (from the past 240 hour(s))  Blood culture (routine x 2)     Status: None   Collection Time: 11/05/14  4:54 PM  Result Value Ref Range Status   Specimen Description BLOOD LEFT HAND  Final   Special Requests BOTTLES DRAWN AEROBIC AND ANAEROBIC 4CC  Final   Culture   Final    BACILLUS SPECIES Note: Standardized susceptibility testing for this organism is not available. Note: Gram Stain Report Called to,Read Back By and Verified With: JESSIE CLEAVER ON 4.10.16 @ 8638 BY [place] Performed at [facility]    Report Status 11/07/2014 FINAL  Final  Urine culture     Status: None (Preliminary result)   Collection Time: 11/05/14  5:12 PM  Result Value Ref Range Status   Specimen Description URINE, RANDOM  Final    Special Requests ADDED 2354  Final   Colony Count   Final    40,000 COLONIES/ML Performed at [facility]    Culture   Final    STAPHYLOCOCCUS SPECIES (COAGULASE NEGATIVE) Note: RIFAMPIN AND GENTAMICIN SHOULD NOT BE USED AS SINGLE DRUGS FOR TREATMENT OF STAPH INFECTIONS. Performed at [facility]    Report Status PENDING  Incomplete  MRSA PCR Screening     Status: None   Collection Time: 11/05/14  8:00 PM  Result Value Ref Range Status   MRSA by PCR NEGATIVE NEGATIVE  Final    Comment:        The GeneXpert MRSA Assay (FDA approved for NASAL specimens only), is one component of a comprehensive MRSA colonization surveillance program. It is not intended to diagnose MRSA infection nor to guide or monitor treatment for MRSA infections.   Blood culture (routine x 2)     Status: None (Preliminary result)   Collection Time: 11/05/14  8:34 PM  Result Value Ref Range Status   Specimen Description BLOOD RIGHT HAND  Final   Special Requests BOTTLES DRAWN AEROBIC AND ANAEROBIC 1 CC  Final   Culture   Final           BLOOD CULTURE RECEIVED NO GROWTH TO DATE CULTURE WILL BE HELD FOR 5 DAYS BEFORE ISSUING A FINAL NEGATIVE REPORT Performed at [facility]    Report Status PENDING  Incomplete    Radiology Reports US Renal  11/06/2014   CLINICAL DATA:  69 year old male with renal failure. History of hypertension, diabetes and prostate cancer.  EXAM: RENAL/URINARY TRACT ULTRASOUND COMPLETE  COMPARISON:  None.  FINDINGS: Right Kidney:  Length: 13 cm. A 6 x 7.6 cm cyst extending off of the lower pole is identified. Echogenicity within normal limits. No solid mass or hydronephrosis visualized.  Left Kidney:  Length: 13.7 cm. Echogenicity within normal limits. A 2.6 x 2.2 cm mid renal cyst is present. A 1 cm echogenic area within the lower pole and 1.5 cm echogenic area within the mid left kidney are noted and could represent nonobstructing calculi.  Bladder:  Appears normal  for degree of bladder distention.  IMPRESSION: Bilateral renal cysts and possible nonobstructing left renal calculi.  Normal appearing kidneys and bladder otherwise. No evidence of hydronephrosis.   Electronically Signed   By: Margarette Canada M.D.   On: 11/06/2014 11:57   Dg Chest Portable 1 View  11/05/2014   CLINICAL DATA:  Hyperglycemia and emesis. History of diabetes and hypertension.  EXAM: PORTABLE CHEST - 1 VIEW  COMPARISON:  05/29/2012 and prior chest radiographs dating back to 07/08/2003  FINDINGS: The cardiomediastinal silhouette is unchanged.  Elevation of the right hemidiaphragm and right basilar scarring again noted.  There is no evidence of focal airspace disease, pulmonary edema, suspicious pulmonary nodule/mass, pleural effusion, or pneumothorax. No acute bony abnormalities are identified.  IMPRESSION: No active disease.   Electronically Signed   By: Margarette Canada M.D.   On: 11/05/2014 17:23    CBC  Recent Labs Lab 11/05/14 1517 11/06/14 0411 11/07/14 0300  WBC 13.4* 11.5* 9.4  HGB 14.2 13.8 13.1  HCT 43.7 41.7 39.9  PLT 207 177 176  MCV 84.9 83.1 82.6  MCH 27.6 27.5 27.1  MCHC 32.5 33.1 32.8  RDW 14.5 14.7 14.9  LYMPHSABS 1.7  --   --   MONOABS 1.1*  --   --   EOSABS 0.0  --   --   BASOSABS 0.0  --   --     Chemistries   Recent Labs Lab 11/05/14 1517  11/06/14 0411 11/06/14 0830 11/06/14 1302 11/06/14 1642 11/07/14 0300  NA 135  < > 142 143 143 143 142  K 5.4*  < > 4.6 5.0 4.2 4.0 4.0  CL 96  < > 108 111 110 110 105  CO2 9*  < > 18* 18* 20 24 29   GLUCOSE 427*  < > 157* 151* 221* 167* 183*  BUN 54*  < > 42* 35* 27* 22 14  CREATININE 3.33*  < >  2.17* 1.84* 1.85* 1.61* 1.38*  CALCIUM 9.0  < > 8.3* 8.6 8.6 8.6 8.6  MG  --   --   --   --   --  2.2 2.1  AST 19  --   --   --   --   --  18  ALT 33  --   --   --   --   --  24  ALKPHOS 99  --   --   --   --   --  78  BILITOT 1.8*  --   --   --   --   --  0.8  < > = values in this interval not  displayed. ------------------------------------------------------------------------------------------------------------------ estimated creatinine clearance is 74.4 mL/min (by C-G formula based on Cr of 1.38). ------------------------------------------------------------------------------------------------------------------ No results for input(s): HGBA1C in the last 72 hours. ------------------------------------------------------------------------------------------------------------------ No results for input(s): CHOL, HDL, LDLCALC, TRIG, CHOLHDL, LDLDIRECT in the last 72 hours. ------------------------------------------------------------------------------------------------------------------ No results for input(s): TSH, T4TOTAL, T3FREE, THYROIDAB in the last 72 hours.  Invalid input(s): FREET3 ------------------------------------------------------------------------------------------------------------------ No results for input(s): VITAMINB12, FOLATE, FERRITIN, TIBC, IRON, RETICCTPCT in the last 72 hours.  Coagulation profile No results for input(s): INR, PROTIME in the last 168 hours.  No results for input(s): DDIMER in the last 72 hours.  Cardiac Enzymes No results for input(s): CKMB, TROPONINI, MYOGLOBIN in the last 168 hours.  Invalid input(s): CK ------------------------------------------------------------------------------------------------------------------ Invalid input(s): POCBNP    Jay Holloway D.O. on 11/08/2014 at 12:01 PM  Between 7am to 7pm - Pager - [phone]  After 7pm go to [email or web address] - password [email or web address]  And look for the night coverage person covering for me after hours  [facility] Office  [phone]

## 2014-11-08 NOTE — Progress Notes (Signed)
Physical Therapy Treatment Patient Details Name: Jay Holloway MRN: [id number] DOB: 31-Mar-1946 Today's Date: 11/08/2014    History of Present Illness Patient is a 69 y/o male with PMH of type II diabetes, HTN and prostate ca presenting with nausea/vomiting and weakness. Labs consistent with DKA. Admitted with DKA, AMS and AKI secondary to bacteremia. Renal US- B renal cysts and  nonobstructing left renal caliculi .    PT Comments    Patient progressing well with mobility. Continues to exhibit impaired cardiovascular endurance demonstrated by decrease in oxygen saturation with activity to low 80s. Able to resolve Sa02 quickly with cues for pursed lip breathing. Pt with mild unsteadiness during gait. Might benefit from use of AD for support and to assist with energy conservation.  Will continue to follow per current POC.   Follow Up Recommendations  Home health PT;Supervision/Assistance - 24 hour     Equipment Recommendations  Other (comment)    Recommendations for Other Services       Precautions / Restrictions Precautions Precautions: Fall Restrictions Weight Bearing Restrictions: No    Mobility  Bed Mobility Overal bed mobility: Needs Assistance Bed Mobility: Supine to Sit     Supine to sit: Modified independent (Device/Increase time);HOB elevated        Transfers Overall transfer level: Needs assistance Equipment used: None Transfers: Sit to/from Stand Sit to Stand: Min guard            Ambulation/Gait Ambulation/Gait assistance: Min guard Ambulation Distance (Feet): 150 Feet Assistive device: None Gait Pattern/deviations: Step-through pattern;Decreased stride length;Drifts right/left;Wide base of support   Gait velocity interpretation: Below normal speed for age/gender General Gait Details: Pt reaching for rail/chairs in hallway for support. A few short standing rest breaks. Dyspnea present. Sa02 dropped to 84% on RA.    Stairs             Wheelchair Mobility    Modified Rankin (Stroke Patients Only)       Balance Overall balance assessment: Needs assistance Sitting-balance support: Feet supported;No upper extremity supported Sitting balance-Leahy Scale: Good     Standing balance support: During functional activity Standing balance-Leahy Scale: Fair                      Cognition Arousal/Alertness: Awake/alert Behavior During Therapy: WFL for tasks assessed/performed Overall Cognitive Status: Within Functional Limits for tasks assessed                      Exercises Other Exercises Other Exercises: Sit to stand x8 with cues for controlled descent. Sa02 decreased to low 80s during exercise.    General Comments        Pertinent Vitals/Pain Pain Assessment: Faces Faces Pain Scale: Hurts even more Pain Location: abdomen Pain Descriptors / Indicators: Sore;Aching Pain Intervention(s): Monitored during session;Repositioned    Home Living                      Prior Function            PT Goals (current goals can now be found in the care plan section) Progress towards PT goals: Progressing toward goals    Frequency  Min 3X/week    PT Plan Current plan remains appropriate    Co-evaluation             End of Session Equipment Utilized During Treatment: Gait belt Activity Tolerance: Patient limited by fatigue;Treatment limited secondary to medical complications (Comment) (drop in  oxygen saturation with activity.) Patient left: in bed;with call bell/phone within reach;with bed alarm set     Time: 8786-7672 PT Time Calculation (min) (ACUTE ONLY): 18 min  Charges:  $Therapeutic Activity: 8-22 mins                    G CodesCandy Sledge A 12-Nov-2014, 4:19 PM Candy Sledge, [place], DPT [phone]

## 2014-11-09 ENCOUNTER — Encounter (HOSPITAL_COMMUNITY): Payer: Self-pay | Admitting: *Deleted

## 2014-11-09 ENCOUNTER — Encounter (HOSPITAL_COMMUNITY)
Admission: EM | Disposition: A | Discharge status: Home / Self Care | Payer: Self-pay | Source: Home / Self Care | Attending: Internal Medicine

## 2014-11-09 HISTORY — PX: COLONOSCOPY: SHX5424

## 2014-11-09 LAB — BASIC METABOLIC PANEL
ANION GAP: 16 — AB (ref 5–15)
BUN: 10 mg/dL (ref 6–23)
CO2: 18 mmol/L — AB (ref 19–32)
CREATININE: 1.51 mg/dL — AB (ref 0.50–1.35)
Calcium: 8.7 mg/dL (ref 8.4–10.5)
Chloride: 107 mmol/L (ref 96–112)
GFR calc Af Amer: 53 mL/min — ABNORMAL LOW (ref >90)
GFR calc non Af Amer: 46 mL/min — ABNORMAL LOW (ref >90)
GLUCOSE: 167 mg/dL — AB (ref 70–99)
Potassium: 4.4 mmol/L (ref 3.5–5.1)
Sodium: 141 mmol/L (ref 135–145)

## 2014-11-09 LAB — GLUCOSE, CAPILLARY
GLUCOSE-CAPILLARY: 162 mg/dL — AB (ref 70–99)
Glucose-Capillary: 181 mg/dL — ABNORMAL HIGH (ref 70–99)
Glucose-Capillary: 151 mg/dL — ABNORMAL HIGH (ref 70–99)
Glucose-Capillary: 249 mg/dL — ABNORMAL HIGH (ref 70–99)

## 2014-11-09 LAB — CBC
HCT: 40.6 % (ref 39.0–52.0)
Hemoglobin: 12.9 g/dL — ABNORMAL LOW (ref 13.0–17.0)
MCH: 26.6 pg (ref 26.0–34.0)
MCHC: 31.8 g/dL (ref 30.0–36.0)
MCV: 83.7 fL (ref 78.0–100.0)
Platelets: 154 10*3/uL (ref 150–400)
RBC: 4.85 MIL/uL (ref 4.22–5.81)
RDW: 14.9 % (ref 11.5–15.5)
WBC: 6.2 10*3/uL (ref 4.0–10.5)

## 2014-11-09 LAB — HEMOGLOBIN A1C
Hgb A1c MFr Bld: 13.6 % — ABNORMAL HIGH (ref 4.8–5.6)
Mean Plasma Glucose: 344 mg/dL

## 2014-11-09 SURGERY — COLONOSCOPY
Anesthesia: Moderate Sedation

## 2014-11-09 MED ORDER — CEFAZOLIN SODIUM 1-5 GM-% IV SOLN
1.0000 g | Freq: Three times a day (TID) | INTRAVENOUS | Status: DC
  Administered 2014-11-09 – 2014-11-10 (×3): 1 g via INTRAVENOUS
  Filled 2014-11-09 (×5): qty 50

## 2014-11-09 MED ORDER — POLYETHYLENE GLYCOL 3350 17 G PO PACK
17.0000 g | PACK | Freq: Two times a day (BID) | ORAL | Status: DC
  Administered 2014-11-10 – 2014-11-12 (×2): 17 g via ORAL
  Filled 2014-11-09 (×5): qty 1

## 2014-11-09 MED ORDER — INSULIN DETEMIR 100 UNIT/ML FLEXPEN: 20.0000 [IU] | PEN_INJECTOR | Freq: Every day | SUBCUTANEOUS | Status: DC

## 2014-11-09 MED ORDER — FENTANYL CITRATE 0.05 MG/ML IJ SOLN
INTRAMUSCULAR | Status: AC
  Filled 2014-11-09: qty 2

## 2014-11-09 MED ORDER — INSULIN GLARGINE 100 UNIT/ML ~~LOC~~ SOLN: 20.0000 [IU] | Freq: Every day | SUBCUTANEOUS | Status: DC

## 2014-11-09 MED ORDER — FENTANYL CITRATE 0.05 MG/ML IJ SOLN
25.0000 ug | INTRAMUSCULAR | Status: DC | PRN
  Administered 2014-11-09 – 2014-11-10 (×5): 25 ug via INTRAVENOUS
  Filled 2014-11-09 (×10): qty 2

## 2014-11-09 MED ORDER — FENTANYL CITRATE 0.05 MG/ML IJ SOLN
INTRAMUSCULAR | Status: DC | PRN
  Administered 2014-11-09: 25 ug via INTRAVENOUS

## 2014-11-09 MED ORDER — LIDOCAINE VISCOUS 2 % MT SOLN
OROMUCOSAL | Status: AC
  Filled 2014-11-09: qty 15

## 2014-11-09 MED ORDER — MIDAZOLAM HCL 5 MG/5ML IJ SOLN
INTRAMUSCULAR | Status: DC | PRN
Start: 2014-11-09 — End: 2014-11-09
  Administered 2014-11-09 (×2): 2 mg via INTRAVENOUS

## 2014-11-09 MED ORDER — DIPHENHYDRAMINE HCL 50 MG/ML IJ SOLN
INTRAMUSCULAR | Status: AC
  Filled 2014-11-09: qty 1

## 2014-11-09 MED ORDER — MIDAZOLAM HCL 5 MG/ML IJ SOLN
INTRAMUSCULAR | Status: AC
  Filled 2014-11-09: qty 2

## 2014-11-09 MED ORDER — INSULIN ASPART 100 UNIT/ML FLEXPEN: 6.0000 [IU] | PEN_INJECTOR | Freq: Three times a day (TID) | SUBCUTANEOUS | Status: DC

## 2014-11-09 NOTE — Discharge Summary (Addendum)
Physician Discharge Summary  Jay Holloway MRN: [id number] DOB/AGE: 11-10-1945 69 y.o.  PCP: [name], MD   Admit date: 11/05/2014 Discharge date: 11/09/2014  Discharge Diagnoses:     Active Problems:   DKA (diabetic ketoacidoses)   Arterial hypotension   Renal failure   Type 2 diabetes mellitus with ketoacidosis without coma   Weakness   Bacteremia   Blood poisoning   Urinary tract infectious disease   Sepsis  Follow-up recommendations Follow-up with PCP in 5-7 days Follow-up CBC, CMP, magnesium in one week Outpatient follow-up with urology for hematuria and prostate cancer history Follow up with Dr Collene Mares for outpt colonoscopy    Medication List    STOP taking these medications        ciprofloxacin 500 MG tablet  Commonly known as:  CIPRO     glimepiride 2 MG tablet  Commonly known as:  AMARYL     indomethacin 75 MG CR capsule  Commonly known as:  INDOCIN SR     metFORMIN 500 MG tablet  Commonly known as:  GLUCOPHAGE     naproxen 500 MG tablet  Commonly known as:  NAPROSYN     ONGLYZA 5 MG Tabs tablet  Generic drug:  saxagliptin HCl     VICTOZA 18 MG/3ML Soln injection  Generic drug:  Liraglutide      TAKE these medications        amLODipine 10 MG tablet  Commonly known as:  NORVASC  Take 10 mg by mouth daily.     BD PEN NEEDLE NANO U/F 32G X 4 MM Misc  Generic drug:  Insulin Pen Needle     HYDROcodone-acetaminophen 5-325 MG per tablet  Commonly known as:  NORCO  Take 1-2 tablets by mouth every 6 (six) hours as needed for pain.     HYDROcodone-acetaminophen 5-325 MG per tablet  Commonly known as:  NORCO/VICODIN  Take 1-2 tablets by mouth every 6 (six) hours as needed.     insulin aspart 100 UNIT/ML FlexPen  Commonly known as:  NOVOLOG FLEXPEN  Inject 6 Units into the skin 3 (three) times daily with meals.     Insulin Detemir 100 UNIT/ML Pen  Commonly known as:  LEVEMIR FLEXPEN  Inject 20 Units into the skin daily at 10 pm.      irbesartan 300 MG tablet  Commonly known as:  AVAPRO  Take 300 mg by mouth daily.     rosuvastatin 10 MG tablet  Commonly known as:  CRESTOR  Take 10 mg by mouth every morning.     traMADol 50 MG tablet  Commonly known as:  ULTRAM  Take 50 mg by mouth every 6 (six) hours as needed for moderate pain.        Discharge Condition: Stable   Disposition: 01-Home or Self Care   Consults:  Gastroenterology Infectious disease-via telephone  Significant Diagnostic Studies: US Renal  11/06/2014   CLINICAL DATA:  69 year old male with renal failure. History of hypertension, diabetes and prostate cancer.  EXAM: RENAL/URINARY TRACT ULTRASOUND COMPLETE  COMPARISON:  None.  FINDINGS: Right Kidney:  Length: 13 cm. A 6 x 7.6 cm cyst extending off of the lower pole is identified. Echogenicity within normal limits. No solid mass or hydronephrosis visualized.  Left Kidney:  Length: 13.7 cm. Echogenicity within normal limits. A 2.6 x 2.2 cm mid renal cyst is present. A 1 cm echogenic area within the lower pole and 1.5 cm echogenic area within the mid left kidney are noted  and could represent nonobstructing calculi.  Bladder:  Appears normal for degree of bladder distention.  IMPRESSION: Bilateral renal cysts and possible nonobstructing left renal calculi.  Normal appearing kidneys and bladder otherwise. No evidence of hydronephrosis.   Electronically Signed   By: Margarette Canada M.D.   On: 11/06/2014 11:57   Dg Chest Portable 1 View  11/05/2014   CLINICAL DATA:  Hyperglycemia and emesis. History of diabetes and hypertension.  EXAM: PORTABLE CHEST - 1 VIEW  COMPARISON:  05/29/2012 and prior chest radiographs dating back to 07/08/2003  FINDINGS: The cardiomediastinal silhouette is unchanged.  Elevation of the right hemidiaphragm and right basilar scarring again noted.  There is no evidence of focal airspace disease, pulmonary edema, suspicious pulmonary nodule/mass, pleural effusion, or pneumothorax. No acute  bony abnormalities are identified.  IMPRESSION: No active disease.   Electronically Signed   By: Margarette Canada M.D.   On: 11/05/2014 17:23      Microbiology: Recent Results (from the past 240 hour(s))  Blood culture (routine x 2)     Status: None   Collection Time: 11/05/14  4:54 PM  Result Value Ref Range Status   Specimen Description BLOOD LEFT HAND  Final   Special Requests BOTTLES DRAWN AEROBIC AND ANAEROBIC 4CC  Final   Culture   Final    BACILLUS SPECIES Note: Standardized susceptibility testing for this organism is not available. Note: Gram Stain Report Called to,Read Back By and Verified With: JESSIE CLEAVER ON 4.10.16 @ 6712 BY [place] Performed at [facility]    Report Status 11/07/2014 FINAL  Final  Urine culture     Status: None   Collection Time: 11/05/14  5:12 PM  Result Value Ref Range Status   Specimen Description URINE, RANDOM  Final   Special Requests ADDED 2354  Final   Colony Count   Final    40,000 COLONIES/ML Performed at [facility]    Culture   Final    STAPHYLOCOCCUS SPECIES (COAGULASE NEGATIVE) Note: RIFAMPIN AND GENTAMICIN SHOULD NOT BE USED AS SINGLE DRUGS FOR TREATMENT OF STAPH INFECTIONS. Performed at [facility]    Report Status 11/08/2014 FINAL  Final   Organism ID, Bacteria STAPHYLOCOCCUS SPECIES (COAGULASE NEGATIVE)  Final      Susceptibility   Staphylococcus species (coagulase negative) - MIC*    GENTAMICIN <=0.5 SENSITIVE Sensitive     LEVOFLOXACIN >=8 RESISTANT Resistant     NITROFURANTOIN <=16 SENSITIVE Sensitive     OXACILLIN <=0.25 SENSITIVE Sensitive     PENICILLIN >=0.5 RESISTANT Resistant     RIFAMPIN <=0.5 SENSITIVE Sensitive     TRIMETH/SULFA <=10 SENSITIVE Sensitive     VANCOMYCIN 1 SENSITIVE Sensitive     TETRACYCLINE 2 SENSITIVE Sensitive     * STAPHYLOCOCCUS SPECIES (COAGULASE NEGATIVE)  MRSA PCR Screening     Status: None   Collection Time: 11/05/14  8:00 PM  Result Value Ref Range Status   MRSA  by PCR NEGATIVE NEGATIVE Final    Comment:        The GeneXpert MRSA Assay (FDA approved for NASAL specimens only), is one component of a comprehensive MRSA colonization surveillance program. It is not intended to diagnose MRSA infection nor to guide or monitor treatment for MRSA infections.   Blood culture (routine x 2)     Status: None (Preliminary result)   Collection Time: 11/05/14  8:34 PM  Result Value Ref Range Status   Specimen Description BLOOD RIGHT HAND  Final   Special  Requests BOTTLES DRAWN AEROBIC AND ANAEROBIC 1 CC  Final   Culture   Final           BLOOD CULTURE RECEIVED NO GROWTH TO DATE CULTURE WILL BE HELD FOR 5 DAYS BEFORE ISSUING A FINAL NEGATIVE REPORT Performed at [facility]    Report Status PENDING  Incomplete     Labs: Results for orders placed or performed during the hospital encounter of 11/05/14 (from the past 48 hour(s))  Glucose, capillary     Status: Abnormal   Collection Time: 11/07/14  1:05 PM  Result Value Ref Range   Glucose-Capillary 190 (H) 70 - 99 mg/dL  Glucose, capillary     Status: Abnormal   Collection Time: 11/07/14  5:27 PM  Result Value Ref Range   Glucose-Capillary 168 (H) 70 - 99 mg/dL  Glucose, capillary     Status: Abnormal   Collection Time: 11/07/14  9:46 PM  Result Value Ref Range   Glucose-Capillary 196 (H) 70 - 99 mg/dL   Comment 1 Notify RN   Hemoglobin A1c     Status: Abnormal   Collection Time: 11/08/14  3:00 AM  Result Value Ref Range   Hgb A1c MFr Bld 13.6 (H) 4.8 - 5.6 %    Comment: (NOTE)         Pre-diabetes: 5.7 - 6.4         Diabetes: >6.4         Glycemic control for adults with diabetes: <7.0    Mean Plasma Glucose 344 mg/dL    Comment: (NOTE) Performed At: [facility] [place], [place] [id number] Lindon Romp MD BE:[id number]   Glucose, capillary     Status: Abnormal   Collection Time: 11/08/14  8:20 AM  Result Value Ref Range   Glucose-Capillary 187 (H) 70  - 99 mg/dL  Glucose, capillary     Status: Abnormal   Collection Time: 11/08/14  1:06 PM  Result Value Ref Range   Glucose-Capillary 163 (H) 70 - 99 mg/dL  Glucose, capillary     Status: Abnormal   Collection Time: 11/08/14  4:52 PM  Result Value Ref Range   Glucose-Capillary 160 (H) 70 - 99 mg/dL  Glucose, capillary     Status: Abnormal   Collection Time: 11/08/14 10:01 PM  Result Value Ref Range   Glucose-Capillary 182 (H) 70 - 99 mg/dL   Comment 1 Notify RN    Comment 2 Document in Chart   Basic metabolic panel     Status: Abnormal   Collection Time: 11/09/14  4:04 AM  Result Value Ref Range   Sodium 141 135 - 145 mmol/L   Potassium 4.4 3.5 - 5.1 mmol/L   Chloride 107 96 - 112 mmol/L   CO2 18 (L) 19 - 32 mmol/L   Glucose, Bld 167 (H) 70 - 99 mg/dL   BUN 10 6 - 23 mg/dL   Creatinine, Ser 1.51 (H) 0.50 - 1.35 mg/dL   Calcium 8.7 8.4 - 10.5 mg/dL   GFR calc non Af Amer 46 (L) >90 mL/min   GFR calc Af Amer 53 (L) >90 mL/min    Comment: (NOTE) The eGFR has been calculated using the CKD EPI equation. This calculation has not been validated in all clinical situations. eGFR's persistently <90 mL/min signify possible Chronic Kidney Disease.    Anion gap 16 (H) 5 - 15  CBC     Status: Abnormal   Collection Time: 11/09/14  4:04 AM  Result Value Ref Range   WBC 6.2 4.0 - 10.5 K/uL   RBC 4.85 4.22 - 5.81 MIL/uL   Hemoglobin 12.9 (L) 13.0 - 17.0 g/dL   HCT 40.6 39.0 - 52.0 %   MCV 83.7 78.0 - 100.0 fL   MCH 26.6 26.0 - 34.0 pg   MCHC 31.8 30.0 - 36.0 g/dL   RDW 14.9 11.5 - 15.5 %   Platelets 154 150 - 400 K/uL  Glucose, capillary     Status: Abnormal   Collection Time: 11/09/14  7:34 AM  Result Value Ref Range   Glucose-Capillary 181 (H) 70 - 99 mg/dL     Interim history 69 year old with history of type 2 diabetes mellitus, percent with nausea, vomiting, weakness, and found to have DKA. Patient was initially admitted by PCCM, as he was found to be acidotic, have  hypotension, with Kussmal respirations. Patient improved and was sent to medical floor. DKA has improved. HAI has improved. Patient also found to have bacteremia, sepsis secondary to urinary tract infection, currently on vancomycin. PT recommended home health therapy. Diabetes coordinator consulted. Hemoglobin A1c 13.6   Assessment & Plan   Sepsis secondary to UTI vs Bacteremia -Upon admission, patient did have leukocytosis with tachycardia. -Blood cultures from 11/05/2014: Bacillus species (1/2) -Urine culture showed Staphylococcus, coag negative Initially on vancomycin, switched to cefazolin, discussed with infectious disease Dr. Drucilla Schmidt 5 days of cefazolin/vancomycin as adequate treatment for this Bacillus species   which could possibly be a contaminant Infectious disease agrees with colonoscopy, patient will discharge home today if colonoscopy is negative -Patient had a colonoscopy in 2012 and is due for one in Jan 2017 (spoke with Dr. Collene Mares), Dr. Dallas Breeding. Collene Mares to perform colonoscopy ,Solid stool was noted throughout the left colon and rectum. The procedure was aborted  Since bacillus cereus could very well be a contaminant and patient has had adequate therapy , he will be discharged and follow up with Dr Collene Mares outpt    Diabetic ketoacidosis/diabetes mellitus Outpatient Diabetes medications: Amaryl 2 mg, Victoza 1.8 ml daily, Metformin 1000 mg bid, Onglyza 5 mg daily -Resolved, gap has closed. -Hemoglobin A1c 13.6 Diabetes coordinator . All by mouth medications stopped Patient has been switched to Levemir and NovoLog FlexPen Teaching done prior to discharge   Hypotension -Resolved, likely secondary to above processes  Acute kidney injury -Likely secondary to DKA versus sepsis versus hypotension -Renal ultrasound: No hydronephrosis, nonobstructive left caliculi  Hematuria/history of prostate cancer -Will need to follow up with urologist at discharge -Hemoglobin currently  stable  Abdominal pain, nausea, vomiting -Resolved, likely secondary to DKA  Leukocytosis -Likely secondary to DKA vs sepsis -Resolved  Code Status: Full          Discharge Exam:  Blood pressure 116/65, pulse 103, temperature 98.3 F (36.8 C), temperature source Oral, resp. rate 17, height $RemoveBe'6\' 4"'wXZmInCyW$  (1.93 m), weight 121.8 kg (268 lb 8.3 oz), SpO2 99 %.  General: Well developed, well nourished, NAD, appears stated age  48: NCAT,mucous membranes moist.   Cardiovascular: S1 S2 auscultated, no rubs, murmurs or gallops. Regular rate and rhythm.  Respiratory: Clear to auscultation bilaterally with equal chest rise  Abdomen: Soft, nontender, nondistended, + bowel sounds  Extremities: warm dry without cyanosis clubbing or edema  Neuro: AAOx3, nonfocal  Psych: Normal affect and demeanor        Discharge Instructions    Diet - low sodium heart healthy    Complete by:  As directed      Increase  activity slowly    Complete by:  As directed            Follow-up Information    Follow up with [name], MD. Schedule an appointment as soon as possible for a visit in 3 days.   Specialty:  Endocrinology   Contact information:   [address] [place] [address] [phone]       Signed: Reyne Dumas 11/09/2014, 9:55 AM

## 2014-11-09 NOTE — Progress Notes (Signed)
Occupational Therapy Treatment Patient Details Name: Jay Holloway MRN: [id number] DOB: [birth year] Today's Date: 11/09/2014    History of present illness Patient is a 69 y/o male with PMH of type II diabetes, HTN and prostate ca presenting with nausea/vomiting and weakness. Labs consistent with DKA. Admitted with DKA, AMS and AKI secondary to bacteremia. Renal US- B renal cysts and  nonobstructing left renal caliculi .   OT comments  Pt making progress with functional goals and should continue with acute OT services to increase level of function and safety to return home. Pt possibly to d/c home today  Follow Up Recommendations  No OT follow up;Supervision - Intermittent    Equipment Recommendations  None recommended by OT    Recommendations for Other Services      Precautions / Restrictions Precautions Precautions: Fall Restrictions Weight Bearing Restrictions: No       Mobility Bed Mobility   Bed Mobility: Supine to Sit;Sit to Supine     Supine to sit: Modified independent (Device/Increase time);HOB elevated Sit to supine: Modified independent (Device/Increase time)      Transfers Overall transfer level: Needs assistance Equipment used: None Transfers: Sit to/from Stand Sit to Stand: Min guard              Balance           Standing balance support: During functional activity;No upper extremity supported Standing balance-Leahy Scale: Fair                     ADL       Grooming: Wash/dry hands;Wash/dry face;Supervision/safety       Lower Body Bathing: Set up;Sit to/from stand;Sitting/lateral leans;Supervison/ safety       Lower Body Dressing: Set up;Min guard;Sitting/lateral leans;Sit to/from stand   Toilet Transfer: Min guard;Regular Toilet;Grab bars;Ambulation   Toileting- Clothing Manipulation and Hygiene: Sit to/from stand;Supervision/safety       Functional mobility during ADLs: Min guard General ADL Comments: pt educated on  energy conservation techniques wiht handout provided      Vision  no change from baseline                   Perception Perception Perception Tested?: No   Praxis Praxis Praxis tested?: Not tested    Cognition   Behavior During Therapy: WFL for tasks assessed/performed Overall Cognitive Status: Within Functional Limits for tasks assessed                       Extremity/Trunk Assessment   generalized weakness                        General Comments  pt pleasant and cooperative    Pertinent Vitals/ Pain       Pain Assessment: No/denies pain  Home Living  home alone                                        Prior Functioning/Environment  independent           Frequency Min 2X/week     Progress Toward Goals  OT Goals(current goals can now be found in the care plan section)  Progress towards OT goals: Progressing toward goals     Plan  2x wk  End of Session     Activity Tolerance Patient tolerated treatment well   Patient Left in bed;with call bell/phone within reach             Time: 7371-0626 OT Time Calculation (min): 24 min  Charges: OT General Charges $OT Visit: 1 Procedure OT Treatments $Self Care/Home Management : 8-22 mins  Emmit [facility] 11/09/2014, 12:02 PM

## 2014-11-09 NOTE — Op Note (Addendum)
[facility] [place], [address]   OPERATIVE PROCEDURE REPORT  PATIENT: Jay Holloway, Jay Holloway  MR#: [id number] BIRTHDATE: 15-Sep-1945 GENDER: male ENDOSCOPIST: Edmonia James, MD ASSISTANT:   Sharon Mt, technician and Laverta Baltimore, RN. PROCEDURE DATE: Nov 12, 2014 PRE-PROCEDURE PREPARATION: Patient fasted for 4 hours prior to procedure. The patient was prepped with a gallon of Golytely the night prior to the procedure.  The patient has fasted for 4 hours prior to the procedure PRE-PROCEDURE PHYSICAL: Patient has stable vital signs.  Neck is supple.  There is no JVD, thyromegaly or LAD.  Chest clear to auscultation.  S1 and S2 regular.  Abdomen soft, non-distended, non-tender with NABS. PROCEDURE:     Colonoscopy changed to a flexible sigmoidoscopy.  ASA CLASS:     Class III INDICATIONS:     1. Personal history of colonic polyps 2) Average risk patient for colon cancer 3) Bacillus species bacteremia. MEDICATIONS:     Fentanyl 25 mcg and Versed 4 mg IV  DESCRIPTION OF PROCEDURE: After the risks, benefits, and alternatives of the procedure were thoroughly explained [including a 10% missed rate of cancer and polyps], informed consent was obtained.  Digital rectal exam was performed.  The Pentax Adult Colon [phone]  was introduced through the anus  and advanced to the sigmoid colon, limited by solid stool in the colon. No adverse events experienced.   The quality of the prep was poor. . Multiple washes were done. Small lesions could be missed. The instrument was then slowly withdrawn as the colon was fully examined. Estimated blood loss is zero unless otherwise noted in this procedure report.     COLON FINDINGS: There were a few scattered sigmoid diverticula noted. Solid stool was noted throughout the left colon and rectum. The procedure was aborted at this time for plans to reprep and redo on 11/11/14. The patient tolerated the procedure  without immediate complications.  The scope was then withdrawn from the patient and the procedure terminated.  ds ds  IMPRESSION:     Solid stool in the left colon-a few diverticula noted; procedure aborted.  RECOMMENDATIONS:     Reprep for a repeat colonoscopy on 11/11/14 with a 2 day prep.  REPEAT EXAM:      for colonoscopy on Friday 11/11/14.   REFERRED BY: THP  eSigned:  Edmonia James, MD Nov 12, 2014 3:43 PM Revised: 11/12/2014 3:43 PM  CPT CODES:     36629-47 Colonoscopy changed to a flexible sigmoidoscopy. ICD CODES:     Z86.010, Z12.11  The ICD and CPT codes recommended by this software are interpretations from the data that the clinical staff has captured with the software.  The verification of the translation of this report to the ICD and CPT codes and modifiers is the sole responsibility of the health care institution and practicing physician where this report was generated.  [employer] will not be held responsible for the validity of the ICD and CPT codes included on this report.  AMA assumes no liability for data contained or not contained herein. CPT is a Designer, television/film set of the [employer].  PATIENT NAME:  Jay Holloway, Jay Holloway MR#: [id number]

## 2014-11-10 ENCOUNTER — Inpatient Hospital Stay (HOSPITAL_COMMUNITY): Payer: Medicare Other

## 2014-11-10 ENCOUNTER — Encounter (HOSPITAL_COMMUNITY): Payer: Self-pay | Admitting: Gastroenterology

## 2014-11-10 LAB — COMPREHENSIVE METABOLIC PANEL
ALBUMIN: 3.3 g/dL — AB (ref 3.5–5.2)
ALT: 22 U/L (ref 0–53)
ANION GAP: 15 (ref 5–15)
AST: 15 U/L (ref 0–37)
Alkaline Phosphatase: 86 U/L (ref 39–117)
BILIRUBIN TOTAL: 1.3 mg/dL — AB (ref 0.3–1.2)
CHLORIDE: 104 mmol/L (ref 96–112)
CO2: 21 mmol/L (ref 19–32)
Calcium: 9.3 mg/dL (ref 8.4–10.5)
Creatinine, Ser: 1.38 mg/dL — ABNORMAL HIGH (ref 0.50–1.35)
GFR calc Af Amer: 60 mL/min — ABNORMAL LOW (ref >90)
GFR, EST NON AFRICAN AMERICAN: 51 mL/min — AB (ref >90)
Glucose, Bld: 218 mg/dL — ABNORMAL HIGH (ref 70–99)
POTASSIUM: 3.4 mmol/L — AB (ref 3.5–5.1)
Sodium: 140 mmol/L (ref 135–145)
Total Protein: 7 g/dL (ref 6.0–8.3)

## 2014-11-10 LAB — GLUCOSE, CAPILLARY
GLUCOSE-CAPILLARY: 224 mg/dL — AB (ref 70–99)
GLUCOSE-CAPILLARY: 198 mg/dL — AB (ref 70–99)
Glucose-Capillary: 227 mg/dL — ABNORMAL HIGH (ref 70–99)
Glucose-Capillary: 227 mg/dL — ABNORMAL HIGH (ref 70–99)

## 2014-11-10 LAB — LIPASE, BLOOD: LIPASE: 24 U/L (ref 11–59)

## 2014-11-10 MED ORDER — IOHEXOL 300 MG/ML  SOLN
25.0000 mL | INTRAMUSCULAR | Status: AC
  Administered 2014-11-10 (×2): 25 mL via ORAL

## 2014-11-10 MED ORDER — FENTANYL CITRATE (PF) 100 MCG/2ML IJ SOLN
25.0000 ug | INTRAMUSCULAR | Status: DC | PRN
  Administered 2014-11-10 – 2014-11-12 (×7): 25 ug via INTRAVENOUS
  Filled 2014-11-10 (×2): qty 2

## 2014-11-10 MED ORDER — LIVING WELL WITH DIABETES BOOK
Freq: Once | Status: AC
  Administered 2014-11-10: 08:00:00
  Filled 2014-11-10: qty 1

## 2014-11-10 MED ORDER — PIPERACILLIN-TAZOBACTAM 3.375 G IVPB
3.3750 g | Freq: Three times a day (TID) | INTRAVENOUS | Status: DC
  Administered 2014-11-10 – 2014-11-13 (×9): 3.375 g via INTRAVENOUS
  Filled 2014-11-10 (×12): qty 50

## 2014-11-10 MED ORDER — POTASSIUM CHLORIDE IN NACL 20-0.9 MEQ/L-% IV SOLN
INTRAVENOUS | Status: DC
  Administered 2014-11-10 – 2014-11-12 (×3): via INTRAVENOUS
  Filled 2014-11-10 (×6): qty 1000

## 2014-11-10 NOTE — Plan of Care (Signed)
Problem: Food- and Nutrition-Related Knowledge Deficit (NB-1.1) Goal: Nutrition education Formal process to instruct or train a patient/client in a skill or to impart knowledge to help patients/clients voluntarily manage or modify food choices and eating behavior to maintain or improve health. Outcome: Adequate for Discharge  RD consulted for nutrition education regarding diabetes.     Lab Results  Component Value Date    HGBA1C 13.6* 11/08/2014    RD provided "Carbohydrate Counting for People with Diabetes" handout from the Academy of Nutrition and Dietetics. Discussed different food groups and their effects on blood sugar, emphasizing carbohydrate-containing foods. Provided list of carbohydrates and recommended serving sizes of common foods.  Discussed importance of controlled and consistent carbohydrate intake throughout the day. Provided examples of ways to balance meals/snacks and encouraged intake of high-fiber, whole grain complex carbohydrates. Teach back method used.  Expect fair to good compliance.  Body mass index is 32.7 kg/(m^2). Pt meets criteria for obesity, class I based on current BMI.  Current diet order is clear liquid, patient is consuming approximately 25% of meals at this time. Labs and medications reviewed. No further nutrition interventions warranted at this time. RD contact information provided. If additional nutrition issues arise, please re-consult RD.  Jumanah Hynson A. Jimmye Norman, RD, LDN, CDE Pager: [phone] After hours Pager: [phone]

## 2014-11-10 NOTE — Care Management Note (Signed)
  Page 2 of 2   11/11/2014     8:14:59 AM CARE MANAGEMENT NOTE 11/11/2014  Patient:  Jay Holloway, Jay Holloway   Account Number:  [id number]  Date Initiated:  11/09/2014  Documentation initiated by:  Elenor Quinones  Subjective/Objective Assessment:   DKA, bacteremia     Action/Plan:   colonoscopy, ID, diabletes management   Anticipated DC Date:  11/11/2014   Anticipated DC Plan:  [place]         Choice offered to / List presented to:  C-1 Patient        [place] arranged  HH-1 RN  [place] PT      [place] agency  [facility]   Status of service:  Completed, signed off Medicare Important Message given?  YES (If response is "NO", the following Medicare IM given date fields will be blank) Date Medicare IM given:  11/10/2014 Medicare IM given by:  Magdalen Spatz Date Additional Medicare IM given:  11/11/2014 Additional Medicare IM given by:  Magdalen Spatz  Discharge Disposition:  [place]  Per UR Regulation:  Reviewed for med. necessity/level of care/duration of stay  If discussed at [place] of Stay Meetings, dates discussed:   11/10/2014    Comments:  11-10-14 [facility]  called back and declined referral due to patient's PCP . Patient aware , would like [place]. [facility] with [facility] accepted referral . Magdalen Spatz RN BSN    11/09/14  [facility] called CM and accepted referral.  11/09/14  Pt chose [facility] for [place] (RN,Aide, PT, OT) .  Pts address and phone numbers werre verfired.  CM contacted [facility] and left voicemail regarding referral.

## 2014-11-10 NOTE — Progress Notes (Deleted)
[facility]                                                                              Patient Demographics  Jay Holloway, is a 69 y.o. male, DOB - 1945/09/15, GEX:[id number]  Admit date - 11/05/2014   Admitting Physician Chesley Mires, MD  Outpatient Primary MD for the patient is [name], MD  LOS - 5   Chief Complaint  Patient presents with  . Hyperglycemia  . Emesis  . Diabetic Ketoacidosis       Interim history 69 year old with history of type 2 diabetes mellitus, percent with nausea, vomiting, weakness, and found to have DKA. Patient was initially admitted by PCCM, as he was found to be acidotic, have hypotension, with K[place]mal respirations. Patient improved and was sent to medical floor. DKA has improved. HAI has improved. Patient also found to have bacteremia, sepsis secondary to urinary tract infection, currently on vancomycin. PT recommended home health therapy. Diabetes coordinator consulted. Hemoglobin A1c 13.6   Assessment & Plan   Sepsis secondary to UTI vs Bacteremia -Upon admission, patient did have leukocytosis with tachycardia. -Blood cultures from 11/05/2014: Bacillus species (1/2) -Urine culture showed Staphylococcus, coag negative Initially on vancomycin, switched to cefazolin, discussed with infectious disease Dr. Drucilla Schmidt 5 days of cefazolin/vancomycin as adequate treatment for this Bacillus species which could possibly be a contaminant Infectious disease agrees with colonoscopy, patient will discharge home today if colonoscopy is negative -Patient had a colonoscopy in 2012 and is due for one in Jan 2017 (spoke with Dr. Collene Mares), Dr. Dallas Breeding. Collene Mares to perform colonoscopy ,Solid stool was noted throughout the left colon and rectum. The procedure was aborted  Since bacillus cereus could very well be a contaminant and patient has had adequate therapy , he will be discharged and follow up with Dr Collene Mares outpt    Diabetic ketoacidosis/diabetes  [facility] Outpatient Diabetes medications: Amaryl 2 mg, Victoza 1.8 ml daily, Metformin 1000 mg bid, Onglyza 5 mg daily -Resolved, gap has closed. -Hemoglobin A1c 13.6 Diabetes coordinator . All by mouth medications stopped Patient has been switched to Levemir and NovoLog FlexPen Teaching done prior to discharge   Hypotension -Resolved, likely secondary to above processes  Acute kidney injury -Likely secondary to DKA versus sepsis versus hypotension -Renal ultrasound: No hydronephrosis, nonobstructive left caliculi  Hematuria/history of prostate cancer -Will need to follow up with urologist at discharge -Hemoglobin currently stable  Abdominal pain, nausea, vomiting, recurrence today Initially thought to be secondary to DKA Given that the patient's colonoscopy had to be aborted, will do a CT scan of the abdomen/pelvis to rule out other possibilities including pancreatitis/mass/obstruction   Leukocytosis -Likely secondary to DKA vs sepsis -Resolved  Code Status: Full Discharge held because of her abdominal pain          Time Spent in minutes   35 minutes  Procedures  Renal US  Consults   PCCM Gastroenterology ([place])  DVT Prophylaxis  Heparin  Lab Results  Component Value Date   PLT 154 11/09/2014    Medications  Scheduled Meds: . antiseptic oral rinse  7 mL Mouth Rinse q12n4p  .  ceFAZolin (ANCEF) IV  1 g Intravenous 3 times per  day  . chlorhexidine  15 mL Mouth Rinse BID  . heparin  5,000 Units Subcutaneous 3 times per day  . insulin aspart  0-15 Units Subcutaneous TID WC  . insulin aspart  0-5 Units Subcutaneous QHS  . insulin glargine  20 Units Subcutaneous Daily  . polyethylene glycol  17 g Oral BID   Continuous Infusions: . sodium chloride Stopped (11/08/14 0010)  . sodium chloride     PRN Meds:.  Antibiotics    Anti-infectives    Start     Dose/Rate Route Frequency Ordered Stop   11/09/14 1400  ceFAZolin (ANCEF) IVPB 1 g/50 mL premix      1 g 100 mL/hr over 30 Minutes Intravenous 3 times per day 11/09/14 0739     11/09/14 0600  ceFAZolin (ANCEF) IVPB 1 g/50 mL premix  Status:  Discontinued     1 g 100 mL/hr over 30 Minutes Intravenous 4 times per day 11/08/14 1526 11/09/14 0739   11/08/14 1300  vancomycin (VANCOCIN) 1,500 mg in sodium chloride 0.9 % 500 mL IVPB  Status:  Discontinued     1,500 mg 250 mL/hr over 120 Minutes Intravenous Every 24 hours 11/07/14 1142 11/08/14 1526   11/07/14 1145  vancomycin (VANCOCIN) 2,000 mg in sodium chloride 0.9 % 500 mL IVPB     2,000 mg 250 mL/hr over 120 Minutes Intravenous  Once 11/07/14 1142 11/07/14 1520   11/05/14 2100  cefTRIAXone (ROCEPHIN) 1 g in dextrose 5 % 50 mL IVPB - Premix  Status:  Discontinued     1 g 100 mL/hr over 30 Minutes Intravenous Every 24 hours 11/05/14 1952 11/07/14 1003        Subjective:   Lakai Kihara patient complaining of epigastric/bilateral lower quadrant abdominal pain  Objective:   Filed Vitals:   11/09/14 1600 11/09/14 1629 11/09/14 2058 11/10/14 0610  BP: 136/87 129/85 111/65 113/76  Pulse: 100 93 96 97  Temp:  97.7 F (36.5 C) 98.1 F (36.7 C) 98.8 F (37.1 C)  TempSrc:  Oral Oral Oral  Resp: 23 20 17 19   Height:      Weight:      SpO2: 96% 97% 97% 98%    Wt Readings from Last 3 Encounters:  11/09/14 121.8 kg (268 lb 8.3 oz)  04/18/14 139.056 kg (306 lb 9 oz)  06/04/12 146.512 kg (323 lb)     Intake/Output Summary (Last 24 hours) at 11/10/14 1252 Last data filed at 11/10/14 1243  Gross per 24 hour  Intake    480 ml  Output    550 ml  Net    -70 ml    Exam  General: Well developed, well nourished, NAD, appears stated age  9: NCAT,mucous membranes moist.   Cardiovascular: S1 S2 auscultated, no rubs, murmurs or gallops. Regular rate and rhythm.  Respiratory: Clear to auscultation bilaterally with equal chest rise  Abdomen: Soft, diffuse abdominal pain, nondistended, + bowel sounds  Extremities: warm dry  without cyanosis clubbing or edema  Neuro: AAOx3, nonfocal  Psych: Normal affect and demeanor   Data Review   Micro Results Recent Results (from the past 240 hour(s))  Blood culture (routine x 2)     Status: None   Collection Time: 11/05/14  4:54 PM  Result Value Ref Range Status   Specimen Description BLOOD LEFT HAND  Final   Special Requests BOTTLES DRAWN AEROBIC AND ANAEROBIC 4CC  Final   Culture   Final    BACILLUS SPECIES Note: Standardized susceptibility  testing for this organism is not available. Note: Gram Stain Report Called to,Read Back By and Verified With: JESSIE CLEAVER ON 4.10.16 @ 0677 BY [place] Performed at [facility]    Report Status 11/07/2014 FINAL  Final  Urine culture     Status: None   Collection Time: 11/05/14  5:12 PM  Result Value Ref Range Status   Specimen Description URINE, RANDOM  Final   Special Requests ADDED 2354  Final   Colony Count   Final    40,000 COLONIES/ML Performed at [facility]    Culture   Final    STAPHYLOCOCCUS SPECIES (COAGULASE NEGATIVE) Note: RIFAMPIN AND GENTAMICIN SHOULD NOT BE USED AS SINGLE DRUGS FOR TREATMENT OF STAPH INFECTIONS. Performed at [facility]    Report Status 11/08/2014 FINAL  Final   Organism ID, Bacteria STAPHYLOCOCCUS SPECIES (COAGULASE NEGATIVE)  Final      Susceptibility   Staphylococcus species (coagulase negative) - MIC*    GENTAMICIN <=0.5 SENSITIVE Sensitive     LEVOFLOXACIN >=8 RESISTANT Resistant     NITROFURANTOIN <=16 SENSITIVE Sensitive     OXACILLIN <=0.25 SENSITIVE Sensitive     PENICILLIN >=0.5 RESISTANT Resistant     RIFAMPIN <=0.5 SENSITIVE Sensitive     TRIMETH/SULFA <=10 SENSITIVE Sensitive     VANCOMYCIN 1 SENSITIVE Sensitive     TETRACYCLINE 2 SENSITIVE Sensitive     * STAPHYLOCOCCUS SPECIES (COAGULASE NEGATIVE)  MRSA PCR Screening     Status: None   Collection Time: 11/05/14  8:00 PM  Result Value Ref Range Status   MRSA by PCR NEGATIVE NEGATIVE  Final    Comment:        The GeneXpert MRSA Assay (FDA approved for NASAL specimens only), is one component of a comprehensive MRSA colonization surveillance program. It is not intended to diagnose MRSA infection nor to guide or monitor treatment for MRSA infections.   Blood culture (routine x 2)     Status: None (Preliminary result)   Collection Time: 11/05/14  8:34 PM  Result Value Ref Range Status   Specimen Description BLOOD RIGHT HAND  Final   Special Requests BOTTLES DRAWN AEROBIC AND ANAEROBIC 1 CC  Final   Culture   Final           BLOOD CULTURE RECEIVED NO GROWTH TO DATE CULTURE WILL BE HELD FOR 5 DAYS BEFORE ISSUING A FINAL NEGATIVE REPORT Performed at [facility]    Report Status PENDING  Incomplete    Radiology Reports US Renal  11/06/2014   CLINICAL DATA:  69 year old male with renal failure. History of hypertension, diabetes and prostate cancer.  EXAM: RENAL/URINARY TRACT ULTRASOUND COMPLETE  COMPARISON:  None.  FINDINGS: Right Kidney:  Length: 13 cm. A 6 x 7.6 cm cyst extending off of the lower pole is identified. Echogenicity within normal limits. No solid mass or hydronephrosis visualized.  Left Kidney:  Length: 13.7 cm. Echogenicity within normal limits. A 2.6 x 2.2 cm mid renal cyst is present. A 1 cm echogenic area within the lower pole and 1.5 cm echogenic area within the mid left kidney are noted and could represent nonobstructing calculi.  Bladder:  Appears normal for degree of bladder distention.  IMPRESSION: Bilateral renal cysts and possible nonobstructing left renal calculi.  Normal appearing kidneys and bladder otherwise. No evidence of hydronephrosis.   Electronically Signed   By: Margarette Canada M.D.   On: 11/06/2014 11:57   Dg Chest Portable 1 View  11/05/2014  CLINICAL DATA:  Hyperglycemia and emesis. History of diabetes and hypertension.  EXAM: PORTABLE CHEST - 1 VIEW  COMPARISON:  05/29/2012 and prior chest radiographs dating back to 07/08/2003   FINDINGS: The cardiomediastinal silhouette is unchanged.  Elevation of the right hemidiaphragm and right basilar scarring again noted.  There is no evidence of focal airspace disease, pulmonary edema, suspicious pulmonary nodule/mass, pleural effusion, or pneumothorax. No acute bony abnormalities are identified.  IMPRESSION: No active disease.   Electronically Signed   By: Margarette Canada M.D.   On: 11/05/2014 17:23    CBC  Recent Labs Lab 11/05/14 1517 11/06/14 0411 11/07/14 0300 11/09/14 0404  WBC 13.4* 11.5* 9.4 6.2  HGB 14.2 13.8 13.1 12.9*  HCT 43.7 41.7 39.9 40.6  PLT 207 177 176 154  MCV 84.9 83.1 82.6 83.7  MCH 27.6 27.5 27.1 26.6  MCHC 32.5 33.1 32.8 31.8  RDW 14.5 14.7 14.9 14.9  LYMPHSABS 1.7  --   --   --   MONOABS 1.1*  --   --   --   EOSABS 0.0  --   --   --   BASOSABS 0.0  --   --   --     Chemistries   Recent Labs Lab 11/05/14 1517  11/06/14 0830 11/06/14 1302 11/06/14 1642 11/07/14 0300 11/09/14 0404  NA 135  < > 143 143 143 142 141  K 5.4*  < > 5.0 4.2 4.0 4.0 4.4  CL 96  < > 111 110 110 105 107  CO2 9*  < > 18* 20 24 29  18*  GLUCOSE 427*  < > 151* 221* 167* 183* 167*  BUN 54*  < > 35* 27* 22 14 10   CREATININE 3.33*  < > 1.84* 1.85* 1.61* 1.38* 1.51*  CALCIUM 9.0  < > 8.6 8.6 8.6 8.6 8.7  MG  --   --   --   --  2.2 2.1  --   AST 19  --   --   --   --  18  --   ALT 33  --   --   --   --  24  --   ALKPHOS 99  --   --   --   --  78  --   BILITOT 1.8*  --   --   --   --  0.8  --   < > = values in this interval not displayed. ------------------------------------------------------------------------------------------------------------------ estimated creatinine clearance is 67.7 mL/min (by C-G formula based on Cr of 1.51). ------------------------------------------------------------------------------------------------------------------  Recent Labs  11/08/14 0300  HGBA1C 13.6*    ------------------------------------------------------------------------------------------------------------------ No results for input(s): CHOL, HDL, LDLCALC, TRIG, CHOLHDL, LDLDIRECT in the last 72 hours. ------------------------------------------------------------------------------------------------------------------ No results for input(s): TSH, T4TOTAL, T3FREE, THYROIDAB in the last 72 hours.  Invalid input(s): FREET3 ------------------------------------------------------------------------------------------------------------------ No results for input(s): VITAMINB12, FOLATE, FERRITIN, TIBC, IRON, RETICCTPCT in the last 72 hours.  Coagulation profile No results for input(s): INR, PROTIME in the last 168 hours.  No results for input(s): DDIMER in the last 72 hours.  Cardiac Enzymes No results for input(s): CKMB, TROPONINI, MYOGLOBIN in the last 168 hours.  Invalid input(s): CK ------------------------------------------------------------------------------------------------------------------ Invalid input(s): POCBNP    Avriana Joo D.O. on 11/10/2014 at 12:52 PM  Between 7am to 7pm - Pager [phone]  After 7pm go to [email or web address] - password [email or web address]  And look for the night coverage person covering for me after hours  [facility] Office  [phone]

## 2014-11-10 NOTE — Progress Notes (Signed)
[facility] Consult Note  Jay Holloway July 16, 1946  [id number].    Requesting MD: Dr. Allyson Sabal Chief Complaint/Reason for Consult: upper bdominal pain, nausea  HPI:  69 year old with PMH of type 2 diabetes mellitus (uncontrolled Hgb A1c 13.6), h/o prostate cancer s/p prostatectomy, HLD, HTN who presented to [facility] secondary to elevated blood surgars at home with nausea, weakness, and found to have DKA. Patient was initially admitted by PCCM, as he was found to be acidotic, have hypotension, with Kussmal respirations. Patient improved and was sent to medical floor, triad hospitalist assumed care.  DKA has improved.  Patient also found to have bacteremia, sepsis secondary to urinary tract infection, currently on vancomycin.  After admission he began to have upper abdominal pain (he points to a band around the upper abdomen).  Pt denies ever having trouble with abdominal pain/discomfort.  He says he can normally eat anything he wants.  He describes the pain as a burning pain in the upper abdomen which inhibits him from eating almost anything at this point.  No fever, chills, vomiting, diarrhea, constipation, dysuria, frequency, urgency.  No sick contacts, no traveling, no excessive caffeine, alcohol, tobacco, or NSAIDS.   ROS: All systems reviewed and otherwise negative except for as above  History reviewed. No pertinent family history.  Past Medical History  Diagnosis Date  . Hypertension   . Diabetes mellitus without complication   . Hemorrhoids   . High cholesterol   . Arthritis     trouble turning head side to side and up  . Prostate cancer     Past Surgical History  Procedure Laterality Date  . Right  achilles tendon repair  yrs ago  . Robot assisted laparoscopic radical prostatectomy  06/04/2012    Procedure: ROBOTIC ASSISTED LAPAROSCOPIC RADICAL PROSTATECTOMY LEVEL 3;  Surgeon: Dutch Gray, MD;  Location: [facility];  Service: Urology;  Laterality: N/A;  . Lymphadenectomy   06/04/2012    Procedure: LYMPHADENECTOMY;  Surgeon: Dutch Gray, MD;  Location: [facility];  Service: Urology;  Laterality: Bilateral;  . Cystoscopy with retrograde pyelogram, ureteroscopy and stent placement Left 04/18/2014    Procedure: [place], URETEROSCOPY AND STENT PLACEMENT;  Surgeon: Raynelle Bring, MD;  Location: [facility];  Service: Urology;  Laterality: Left;  . Holmium laser application Left 7/62/8315    Procedure: HOLMIUM LASER APPLICATION;  Surgeon: Raynelle Bring, MD;  Location: [facility];  Service: Urology;  Laterality: Left;  . Colonoscopy N/A 11/09/2014    Procedure: COLONOSCOPY;  Surgeon: Juanita Craver, MD;  Location: [facility] ENDOSCOPY;  Service: Endoscopy;  Laterality: N/A;    Social History:  reports that he quit smoking about 35 years ago. His smoking use included Cigarettes. He has a 1 pack-year smoking history. He has never used smokeless tobacco. He reports that he does not drink alcohol or use illicit drugs.  Allergies: No Known Allergies  Medications Prior to Admission  Medication Sig Dispense Refill  . amLODipine (NORVASC) 10 MG tablet Take 10 mg by mouth daily.    Marland Kitchen glimepiride (AMARYL) 2 MG tablet Take 2 mg by mouth daily before breakfast.    . HYDROcodone-acetaminophen (NORCO) 5-325 MG per tablet Take 1-2 tablets by mouth every 6 (six) hours as needed for pain. 30 tablet 0  . indomethacin (INDOCIN SR) 75 MG CR capsule Take 75 mg by mouth daily at 10 pm.    . irbesartan (AVAPRO) 300 MG tablet Take 300 mg by mouth daily.  1  . Liraglutide (VICTOZA) 18 MG/3ML SOLN  Inject 1.8 mg into the skin daily.    . metFORMIN (GLUCOPHAGE) 500 MG tablet Take 1,000 mg by mouth 2 (two) times daily with a meal.     . naproxen (NAPROSYN) 500 MG tablet Take 500 mg by mouth daily. Patient states takes only 1 daily    . ONGLYZA 5 MG TABS tablet Take 5 mg by mouth daily.  0  . rosuvastatin (CRESTOR) 10 MG tablet Take 10 mg by mouth every morning.    . traMADol (ULTRAM) 50 MG  tablet Take 50 mg by mouth every 6 (six) hours as needed for moderate pain.    . BD PEN NEEDLE NANO U/F 32G X 4 MM MISC   0  . ciprofloxacin (CIPRO) 500 MG tablet Take 1 tablet (500 mg total) by mouth 2 (two) times daily. (Patient not taking: Reported on 11/05/2014) 20 tablet 0  . HYDROcodone-acetaminophen (NORCO/VICODIN) 5-325 MG per tablet Take 1-2 tablets by mouth every 6 (six) hours as needed. 35 tablet 0    Blood pressure 113/76, pulse 97, temperature 98.8 F (37.1 C), temperature source Oral, resp. rate 19, height _0  (1.93 m), weight 121.8 kg (268 lb 8.3 oz), SpO2 98 %. Physical Exam: General: pleasant, WD/WN AA male who is laying in bed in NAD HEENT: head is normocephalic, atraumatic.  Sclera are noninjected.  PERRL.  Ears and nose without any masses or lesions.  Mouth is pink and moist Heart: regular, rate, and rhythm.  No obvious murmurs, gallops, or rubs noted.  Palpable pedal pulses bilaterally Lungs: CTAB, no wheezes, rhonchi, or rales noted.  Respiratory effort nonlabored Abd: soft, ND, tender in epigastrium and RUQ/LUQ minimally, +BS, no masses, hernias, or organomegaly, scar near central lower abdomen from prostate surgery, well healed. MS: all 4 extremities are symmetrical with no cyanosis, clubbing, or edema. Skin: warm and dry with no masses, lesions, or rashes Psych: A&Ox3 with an appropriate affect.   Results for orders placed or performed during the hospital encounter of 11/05/14 (from the past 48 hour(s))  Glucose, capillary     Status: Abnormal   Collection Time: 11/08/14  4:52 PM  Result Value Ref Range   Glucose-Capillary 160 (H) 70 - 99 mg/dL  Glucose, capillary     Status: Abnormal   Collection Time: 11/08/14 10:01 PM  Result Value Ref Range   Glucose-Capillary 182 (H) 70 - 99 mg/dL   Comment 1 Notify RN    Comment 2 Document in Chart   Basic metabolic panel     Status: Abnormal   Collection Time: 11/09/14  4:04 AM  Result Value Ref Range   Sodium 141 135  - 145 mmol/L   Potassium 4.4 3.5 - 5.1 mmol/L   Chloride 107 96 - 112 mmol/L   CO2 18 (L) 19 - 32 mmol/L   Glucose, Bld 167 (H) 70 - 99 mg/dL   BUN 10 6 - 23 mg/dL   Creatinine, Ser 1.51 (H) 0.50 - 1.35 mg/dL   Calcium 8.7 8.4 - 10.5 mg/dL   GFR calc non Af Amer 46 (L) >90 mL/min   GFR calc Af Amer 53 (L) >90 mL/min    Comment: (NOTE) The eGFR has been calculated using the CKD EPI equation. This calculation has not been validated in all clinical situations. eGFR's persistently <90 mL/min signify possible Chronic Kidney Disease.    Anion gap 16 (H) 5 - 15  CBC     Status: Abnormal   Collection Time: 11/09/14  4:04 AM  Result  Value Ref Range   WBC 6.2 4.0 - 10.5 K/uL   RBC 4.85 4.22 - 5.81 MIL/uL   Hemoglobin 12.9 (L) 13.0 - 17.0 g/dL   HCT 40.6 39.0 - 52.0 %   MCV 83.7 78.0 - 100.0 fL   MCH 26.6 26.0 - 34.0 pg   MCHC 31.8 30.0 - 36.0 g/dL   RDW 14.9 11.5 - 15.5 %   Platelets 154 150 - 400 K/uL  Glucose, capillary     Status: Abnormal   Collection Time: 11/09/14  7:34 AM  Result Value Ref Range   Glucose-Capillary 181 (H) 70 - 99 mg/dL  Glucose, capillary     Status: Abnormal   Collection Time: 11/09/14 11:36 AM  Result Value Ref Range   Glucose-Capillary 151 (H) 70 - 99 mg/dL  Glucose, capillary     Status: Abnormal   Collection Time: 11/09/14  5:19 PM  Result Value Ref Range   Glucose-Capillary 162 (H) 70 - 99 mg/dL  Glucose, capillary     Status: Abnormal   Collection Time: 11/09/14  9:30 PM  Result Value Ref Range   Glucose-Capillary 249 (H) 70 - 99 mg/dL  Glucose, capillary     Status: Abnormal   Collection Time: 11/10/14  7:24 AM  Result Value Ref Range   Glucose-Capillary 224 (H) 70 - 99 mg/dL  Comprehensive metabolic panel     Status: Abnormal   Collection Time: 11/10/14 11:10 AM  Result Value Ref Range   Sodium 140 135 - 145 mmol/L   Potassium 3.4 (L) 3.5 - 5.1 mmol/L   Chloride 104 96 - 112 mmol/L   CO2 21 19 - 32 mmol/L   Glucose, Bld 218 (H) 70 - 99  mg/dL   BUN <5 (L) 6 - 23 mg/dL   Creatinine, Ser 1.38 (H) 0.50 - 1.35 mg/dL   Calcium 9.3 8.4 - 10.5 mg/dL   Total Protein 7.0 6.0 - 8.3 g/dL   Albumin 3.3 (L) 3.5 - 5.2 g/dL   AST 15 0 - 37 U/L   ALT 22 0 - 53 U/L   Alkaline Phosphatase 86 39 - 117 U/L   Total Bilirubin 1.3 (H) 0.3 - 1.2 mg/dL   GFR calc non Af Amer 51 (L) >90 mL/min   GFR calc Af Amer 60 (L) >90 mL/min    Comment: (NOTE) The eGFR has been calculated using the CKD EPI equation. This calculation has not been validated in all clinical situations. eGFR's persistently <90 mL/min signify possible Chronic Kidney Disease.    Anion gap 15 5 - 15  Lipase, blood     Status: None   Collection Time: 11/10/14 11:10 AM  Result Value Ref Range   Lipase 24 11 - 59 U/L   Ct Abdomen Pelvis Wo Contrast  11/10/2014   CLINICAL DATA:  Acute onset abdominal pain and nausea. Diabetic ketoacidosis.  EXAM: CT ABDOMEN AND PELVIS WITHOUT CONTRAST  TECHNIQUE: Multidetector CT imaging of the abdomen and pelvis was performed following the standard protocol without IV contrast.  COMPARISON:  CT abdomen and pelvis 10/08/2014.  FINDINGS: There is some mild stress of mild dependent atelectasis is seen the right lung base. Elevation of the right hemidiaphragm is noted. The lung bases are otherwise clear. No pleural or pericardial effusion. Heart size is normal. Calcific coronary artery disease is noted.  Four nonobstructing left renal stones are identified. The largest is in the midpole measuring 0.4 cm. Two small nonobstructing right renal stones are also identified. Bilateral renal cysts  are unchanged. Small stones are seen layering dependently within the gallbladder. Stranding of fat is seen interposed between the inferior margin of the gallbladder and along the lateral margin of the descending duodenum. The spleen, adrenal glands and pancreas appear normal.  The stomach, colon and appendix appear normal. No lymphadenopathy or fluid collection is  identified. The patient is status post prostatectomy.  Flowing syndesmophytes throughout the imaged spine and fusion of the posterior elements is most suggestive of ankylosing spondylitis. No lytic or sclerotic bony lesion is identified.  IMPRESSION: Stranding interposed between the inferior margin of the gallbladder and descending duodenum could be secondary to acute cholecystitis in this patient with small gallstones. Duodenitis is also possible. No focal fluid collection or bowel perforation is seen. Right upper quadrant ultrasound could be used for further evaluation.  Bilateral nonobstructing renal stones.  Calcific aortic and coronary atherosclerosis.  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   Electronically Signed   By: Inge Rise M.D.   On: 11/10/2014 13:30      Assessment/Plan Biliary colic/symptomatic gallstones -Ordered clears, NPO MN -Hold blood thinners after MN -Order [place] and if it confirms cholecystitis we will proceed with lap chole with IOC tomorrow am -Ambulate and IS -SCD's and heparin (will be held) -Pain control, antiemetics, IVF -Pt already started on IV Zosyn  Sepsis due to UTI vs Bacteremia (urine Staph coag neg, blood bacillus species) HTN Uncontrolled DM with Hgb A1C >13 Hyperlipidemia Arthritis H/o prostate cancer s/p prostatectomy 05/2012   Coralie Keens, [facility] 11/10/2014, 2:31 PM Pager: [phone]

## 2014-11-10 NOTE — Progress Notes (Signed)
ANTIBIOTIC CONSULT NOTE - INITIAL  Pharmacy Consult:  Zosyn Indication:  Intra-abdominal infection  No Known Allergies  Patient Measurements: Height: 6\' 4"  (193 cm) Weight: 268 lb 8.3 oz (121.8 kg) IBW/kg (Calculated) : 86.8  Vital Signs: Temp: 98.8 F (37.1 C) (04/14 0610) Temp Source: Oral (04/14 0610) BP: 113/76 mmHg (04/14 0610) Pulse Rate: 97 (04/14 0610) Intake/Output from previous day: 04/13 0701 - 04/14 0700 In: 480 [P.O.:480] Out: 900 [Urine:900] Intake/Output from this shift: Total I/O In: -  Out: 300 [Urine:300]  Labs:  Recent Labs  11/09/14 0404 11/10/14 1110  WBC 6.2  --   HGB 12.9*  --   PLT 154  --   CREATININE 1.51* 1.38*   Estimated Creatinine Clearance: 74.1 mL/min (by C-G formula based on Cr of 1.38). No results for input(s): VANCOTROUGH, VANCOPEAK, VANCORANDOM, GENTTROUGH, GENTPEAK, GENTRANDOM, TOBRATROUGH, TOBRAPEAK, TOBRARND, AMIKACINPEAK, AMIKACINTROU, AMIKACIN in the last 72 hours.   Microbiology: Recent Results (from the past 720 hour(s))  Blood culture (routine x 2)     Status: None   Collection Time: 11/05/14  4:54 PM  Result Value Ref Range Status   Specimen Description BLOOD LEFT HAND  Final   Special Requests BOTTLES DRAWN AEROBIC AND ANAEROBIC 4CC  Final   Culture   Final    BACILLUS SPECIES Note: Standardized susceptibility testing for this organism is not available. Note: Gram Stain Report Called to,Read Back By and Verified With: JESSIE CLEAVER ON 4.10.16 @ 7510 BY [place] Performed at [facility]    Report Status 11/07/2014 FINAL  Final  Urine culture     Status: None   Collection Time: 11/05/14  5:12 PM  Result Value Ref Range Status   Specimen Description URINE, RANDOM  Final   Special Requests ADDED 2354  Final   Colony Count   Final    40,000 COLONIES/ML Performed at [facility]    Culture   Final    STAPHYLOCOCCUS SPECIES (COAGULASE NEGATIVE) Note: RIFAMPIN AND GENTAMICIN SHOULD NOT BE USED AS  SINGLE DRUGS FOR TREATMENT OF STAPH INFECTIONS. Performed at [facility]    Report Status 11/08/2014 FINAL  Final   Organism ID, Bacteria STAPHYLOCOCCUS SPECIES (COAGULASE NEGATIVE)  Final      Susceptibility   Staphylococcus species (coagulase negative) - MIC*    GENTAMICIN <=0.5 SENSITIVE Sensitive     LEVOFLOXACIN >=8 RESISTANT Resistant     NITROFURANTOIN <=16 SENSITIVE Sensitive     OXACILLIN <=0.25 SENSITIVE Sensitive     PENICILLIN >=0.5 RESISTANT Resistant     RIFAMPIN <=0.5 SENSITIVE Sensitive     TRIMETH/SULFA <=10 SENSITIVE Sensitive     VANCOMYCIN 1 SENSITIVE Sensitive     TETRACYCLINE 2 SENSITIVE Sensitive     * STAPHYLOCOCCUS SPECIES (COAGULASE NEGATIVE)  MRSA PCR Screening     Status: None   Collection Time: 11/05/14  8:00 PM  Result Value Ref Range Status   MRSA by PCR NEGATIVE NEGATIVE Final    Comment:        The GeneXpert MRSA Assay (FDA approved for NASAL specimens only), is one component of a comprehensive MRSA colonization surveillance program. It is not intended to diagnose MRSA infection nor to guide or monitor treatment for MRSA infections.   Blood culture (routine x 2)     Status: None (Preliminary result)   Collection Time: 11/05/14  8:34 PM  Result Value Ref Range Status   Specimen Description BLOOD RIGHT HAND  Final   Special Requests BOTTLES DRAWN AEROBIC AND  ANAEROBIC 1 CC  Final   Culture   Final           BLOOD CULTURE RECEIVED NO GROWTH TO DATE CULTURE WILL BE HELD FOR 5 DAYS BEFORE ISSUING A FINAL NEGATIVE REPORT Performed at [facility]    Report Status PENDING  Incomplete    Medical History: Past Medical History  Diagnosis Date  . Hypertension   . Diabetes mellitus without complication   . Hemorrhoids   . High cholesterol   . Arthritis     trouble turning head side to side and up  . Prostate cancer       Assessment: 4 YOM initially on Rocephin for UTI, then antibiotic was changed to vancomycin for CoNS  UTI and bacillus bacteremia.  Antibiotic then narrowed to Ancef given low colony count of CoNS and bacillus spp likely a contaminant.  Now to broaden again to Zosyn for intra-abdominal infection.  Patient's renal function is fluctuating, currently improving.  Vanc 4/11 >>4/12 CTX 4/9 >> 4/10 Cefazolin 4/13 >> 4/14 Zosyn 4/14 >>  4/9 UCx - CoNS (40K c/mL) - oxacillin sensitive 4/9 BCx x2 - bacillus spp (1 of 2)   Goal of Therapy:  Resolution of infection   Plan:  - Zosyn 3.375gm IV Q8H, 4 hr infusion - Pharmacy will sign off as dosage adjustment likely unnecessary.  Thank you for the consult! - Consider supplementing K+   Lian Tanori D. Mina Marble, PharmD, BCPS Pager:  [phone] 11/10/2014, 1:49 PM

## 2014-11-10 NOTE — Progress Notes (Signed)
Physical Therapy Treatment Patient Details Name: Jay Holloway MRN: [id number] DOB: [birth year] Today's Date: 11/10/2014    History of Present Illness Patient is a 69 y/o male with PMH of type II diabetes, HTN and prostate ca presenting with nausea/vomiting and weakness. Labs consistent with DKA. Admitted with DKA, AMS and AKI secondary to bacteremia. Renal US- B renal cysts and  nonobstructing left renal caliculi.     PT Comments    Patient progressing well with mobility. Balance and endurance improved with use of RW for support. Pt continues to exhibit dyspnea with exertion requiring frequent rest breaks. Education provided on energy conservation techniques and use of RW to assist with improved activity tolerance. Will continue to follow per current POC.   Follow Up Recommendations  Home health PT;Supervision/Assistance - 24 hour     Equipment Recommendations  None recommended by PT    Recommendations for Other Services       Precautions / Restrictions Precautions Precautions: Fall Restrictions Weight Bearing Restrictions: No    Mobility  Bed Mobility               General bed mobility comments: Sitting EOB upon PT arrival.   Transfers Overall transfer level: Needs assistance Equipment used: None Transfers: Sit to/from Stand Sit to Stand: Min guard         General transfer comment: Min guard for safety as pt mildly unsteady upon standing. Transferred to chair post ambulation bout.  Ambulation/Gait Ambulation/Gait assistance: Min guard Ambulation Distance (Feet): 150 Feet Assistive device: Rolling walker (2 wheeled) Gait Pattern/deviations: Step-through pattern;Decreased stride length;Trunk flexed   Gait velocity interpretation: Below normal speed for age/gender General Gait Details: Pt used RW for ambulation today. More steady. Dyspnea present however not able to get accurate reading with pulse ox.   Stairs Stairs: Yes Stairs assistance:  Supervision Stair Management: One rail Right;Alternating pattern;Step to pattern;Sideways;Forwards Number of Stairs: 12 General stair comments: Performed alternating step pattern to ascend steps forward and step to pattern sideways with BUEs on rail for support. Dyspnea present, Standing rest break at top of stairs.  Wheelchair Mobility    Modified Rankin (Stroke Patients Only)       Balance Overall balance assessment: Needs assistance Sitting-balance support: Feet supported;No upper extremity supported Sitting balance-Leahy Scale: Good     Standing balance support: During functional activity Standing balance-Leahy Scale: Fair                      Cognition Arousal/Alertness: Awake/alert Behavior During Therapy: WFL for tasks assessed/performed Overall Cognitive Status: Within Functional Limits for tasks assessed                      Exercises      General Comments        Pertinent Vitals/Pain Pain Assessment: Faces Faces Pain Scale: Hurts little more Pain Location: abdomen Pain Descriptors / Indicators: Sore;Aching Pain Intervention(s): Monitored during session;Repositioned    Home Living                      Prior Function            PT Goals (current goals can now be found in the care plan section) Progress towards PT goals: Progressing toward goals    Frequency  Min 3X/week    PT Plan Current plan remains appropriate    Co-evaluation             End of Session  Equipment Utilized During Treatment: Gait belt Activity Tolerance: Patient tolerated treatment well;Patient limited by fatigue Patient left: in chair;with call bell/phone within reach     Time: 1027-1040 PT Time Calculation (min) (ACUTE ONLY): 13 min  Charges:  $Gait Training: 8-22 mins                    G CodesCandy Sledge A Nov 17, 2014, 11:17 AM Candy Sledge, PT, DPT [phone]

## 2014-11-10 NOTE — Progress Notes (Signed)
Expand All Collapse All    [facility]    Patient Demographics  Jay Holloway, is a 69 y.o. male, DOB - [birth year], EAV:[id number]  Admit date - 11/05/2014 Admitting Physician Chesley Mires, MD  Outpatient Primary MD for the patient is [name], MD  LOS - 5   Chief Complaint  Patient presents with  . Hyperglycemia  . Emesis  . Diabetic Ketoacidosis     Interim history 69 year old with history of type 2 diabetes mellitus, percent with nausea, vomiting, weakness, and found to have DKA. Patient was initially admitted by PCCM, as he was found to be acidotic, have hypotension, with Kussmal respirations. Patient improved and was sent to medical floor. DKA has improved. HAI has improved. Patient also found to have bacteremia, sepsis secondary to urinary tract infection, currently on vancomycin. PT recommended home health therapy. Diabetes coordinator consulted. Hemoglobin A1c 13.6   Assessment & Plan   Sepsis secondary to UTI vs Bacteremia -Upon admission, patient did have leukocytosis with tachycardia. -Blood cultures from 11/05/2014: Bacillus species (1/2) -Urine culture showed Staphylococcus, coag negative Initially on vancomycin, switched to cefazolin, discussed with infectious disease Dr. Drucilla Schmidt 5 days of cefazolin/vancomycin as adequate treatment for this Bacillus species which could possibly be a contaminant Infectious disease agrees with colonoscopy, patient will discharge home today if colonoscopy is negative -Patient had a colonoscopy in 2012 and is due for one in Jan 2017 (spoke with Dr. Collene Mares), Dr. Dallas Breeding. Collene Mares to perform colonoscopy ,Solid stool was noted throughout the left colon and rectum. The procedure was aborted  Since bacillus cereus could very well be a contaminant and patient has had adequate therapy , he will be discharged and follow up with Dr Collene Mares outpt     Diabetic ketoacidosis/diabetes mellitus Outpatient Diabetes medications: Amaryl 2 mg, Victoza 1.8 ml daily, Metformin 1000 mg bid, Onglyza 5 mg daily -Resolved, gap has closed. -Hemoglobin A1c 13.6 Diabetes coordinator . All by mouth medications stopped Patient has been switched to Levemir and NovoLog FlexPen Teaching done prior to discharge   Hypotension -Resolved, likely secondary to above processes  Acute kidney injury -Likely secondary to DKA versus sepsis versus hypotension -Renal ultrasound: No hydronephrosis, nonobstructive left caliculi  Hematuria/history of prostate cancer -Will need to follow up with urologist at discharge -Hemoglobin currently stable  Abdominal pain, nausea, vomiting, recurrence today Initially thought to be secondary to DKA Given that the patient's colonoscopy had to be aborted, will do a CT scan of the abdomen/pelvis to rule out other possibilities including pancreatitis/mass/obstruction Suspected cholecystitis on CT scan ,start zosyn, NPO , surgery consult today    Leukocytosis -Likely secondary to DKA vs sepsis -Resolved  Code Status: Full Discharge held because of her abdominal pain          Time Spent in minutes 35 minutes  Procedures  Renal US  Consults  PCCM Gastroenterology ([facility])  DVT Prophylaxis Heparin   Recent Labs    Lab Results  Component Value Date   PLT 154 11/09/2014      Medications  Scheduled Meds: . antiseptic oral rinse 7 mL Mouth Rinse q12n4p  . ceFAZolin (ANCEF) IV 1 g Intravenous 3 times per day  . chlorhexidine 15 mL Mouth Rinse BID  . heparin 5,000 Units Subcutaneous 3 times per day  . insulin aspart 0-15 Units Subcutaneous TID WC  . insulin aspart 0-5 Units Subcutaneous QHS  . insulin glargine 20 Units Subcutaneous Daily  . polyethylene glycol 17 g Oral BID   Continuous Infusions: . sodium  chloride Stopped  (11/08/14 0010)  . sodium chloride    PRN Meds:.  Antibiotics   Anti-infectives    Start   Dose/Rate Route Frequency Ordered Stop   11/09/14 1400  ceFAZolin (ANCEF) IVPB 1 g/50 mL premix    1 g 100 mL/hr over 30 Minutes Intravenous 3 times per day 11/09/14 0739    11/09/14 0600  ceFAZolin (ANCEF) IVPB 1 g/50 mL premix Status: Discontinued    1 g 100 mL/hr over 30 Minutes Intravenous 4 times per day 11/08/14 1526 11/09/14 0739   11/08/14 1300  vancomycin (VANCOCIN) 1,500 mg in sodium chloride 0.9 % 500 mL IVPB Status: Discontinued    1,500 mg 250 mL/hr over 120 Minutes Intravenous Every 24 hours 11/07/14 1142 11/08/14 1526   11/07/14 1145  vancomycin (VANCOCIN) 2,000 mg in sodium chloride 0.9 % 500 mL IVPB    2,000 mg 250 mL/hr over 120 Minutes Intravenous Once 11/07/14 1142 11/07/14 1520   11/05/14 2100  cefTRIAXone (ROCEPHIN) 1 g in dextrose 5 % 50 mL IVPB - Premix Status: Discontinued    1 g 100 mL/hr over 30 Minutes Intravenous Every 24 hours 11/05/14 1952 11/07/14 1003        Subjective:   Jay Holloway patient complaining of epigastric/bilateral lower quadrant abdominal pain  Objective:   Filed Vitals:   11/09/14 1600 11/09/14 1629 11/09/14 2058 11/10/14 0610  BP: 136/87 129/85 111/65 113/76  Pulse: 100 93 96 97  Temp:  97.7 F (36.5 C) 98.1 F (36.7 C) 98.8 F (37.1 C)  TempSrc:  Oral Oral Oral  Resp: 23 20 17 19   Height:      Weight:      SpO2: 96% 97% 97% 98%    Wt Readings from Last 3 Encounters:  11/09/14 121.8 kg (268 lb 8.3 oz)  04/18/14 139.056 kg (306 lb 9 oz)  06/04/12 146.512 kg (323 lb)     Intake/Output Summary (Last 24 hours) at 11/10/14 1252 Last data filed at 11/10/14 1243  Gross per 24 hour  Intake  480 ml  Output  550 ml  Net  -70 ml    Exam  General: Well developed,  well nourished, NAD, appears stated age  59: NCAT,mucous membranes moist.   Cardiovascular: S1 S2 auscultated, no rubs, murmurs or gallops. Regular rate and rhythm.  Respiratory: Clear to auscultation bilaterally with equal chest rise  Abdomen: Soft, diffuse abdominal pain, nondistended, + bowel sounds  Extremities: warm dry without cyanosis clubbing or edema  Neuro: AAOx3, nonfocal  Psych: Normal affect and demeanor  Data Review   Micro Results Recent Results (from the past 240 hour(s))  Blood culture (routine x 2) Status: None   Collection Time: 11/05/14 4:54 PM  Result Value Ref Range Status   Specimen Description BLOOD LEFT HAND  Final   Special Requests BOTTLES DRAWN AEROBIC AND ANAEROBIC 4CC  Final   Culture   Final    BACILLUS SPECIES Note: Standardized susceptibility testing for this organism is not available. Note: Gram Stain Report Called to,Read Back By and Verified With: JESSIE CLEAVER ON 4.10.16 @ 0102 BY [place] Performed at [facility]    Report Status 11/07/2014 FINAL  Final  Urine culture Status: None   Collection Time: 11/05/14 5:12 PM  Result Value Ref Range Status   Specimen Description URINE, RANDOM  Final   Special Requests ADDED 2354  Final   Colony Count   Final    40,000 COLONIES/ML Performed at [facility]  Culture   Final    STAPHYLOCOCCUS SPECIES (COAGULASE NEGATIVE) Note: RIFAMPIN AND GENTAMICIN SHOULD NOT BE USED AS SINGLE DRUGS FOR TREATMENT OF STAPH INFECTIONS. Performed at [facility]    Report Status 11/08/2014 FINAL  Final   Organism ID, Bacteria STAPHYLOCOCCUS SPECIES (COAGULASE NEGATIVE)  Final   Susceptibility   Staphylococcus species (coagulase negative) - MIC*    GENTAMICIN <=0.5 SENSITIVE Sensitive     LEVOFLOXACIN >=8 RESISTANT Resistant     NITROFURANTOIN <=16 SENSITIVE Sensitive      OXACILLIN <=0.25 SENSITIVE Sensitive     PENICILLIN >=0.5 RESISTANT Resistant     RIFAMPIN <=0.5 SENSITIVE Sensitive     TRIMETH/SULFA <=10 SENSITIVE Sensitive     VANCOMYCIN 1 SENSITIVE Sensitive     TETRACYCLINE 2 SENSITIVE Sensitive    * STAPHYLOCOCCUS SPECIES (COAGULASE NEGATIVE)  MRSA PCR Screening Status: None   Collection Time: 11/05/14 8:00 PM  Result Value Ref Range Status   MRSA by PCR NEGATIVE NEGATIVE Final    Comment:   The GeneXpert MRSA Assay (FDA approved for NASAL specimens only), is one component of a comprehensive MRSA colonization surveillance program. It is not intended to diagnose MRSA infection nor to guide or monitor treatment for MRSA infections.   Blood culture (routine x 2) Status: None (Preliminary result)   Collection Time: 11/05/14 8:34 PM  Result Value Ref Range Status   Specimen Description BLOOD RIGHT HAND  Final   Special Requests BOTTLES DRAWN AEROBIC AND ANAEROBIC 1 CC  Final   Culture   Final     BLOOD CULTURE RECEIVED NO GROWTH TO DATE CULTURE WILL BE HELD FOR 5 DAYS BEFORE ISSUING A FINAL NEGATIVE REPORT Performed at [facility]    Report Status PENDING  Incomplete    Radiology Reports  Imaging Results    US Renal  11/06/2014 CLINICAL DATA: 69 year old male with renal failure. History of hypertension, diabetes and prostate cancer. EXAM: RENAL/URINARY TRACT ULTRASOUND COMPLETE COMPARISON: None. FINDINGS: Right Kidney: Length: 13 cm. A 6 x 7.6 cm cyst extending off of the lower pole is identified. Echogenicity within normal limits. No solid mass or hydronephrosis visualized. Left Kidney: Length: 13.7 cm. Echogenicity within normal limits. A 2.6 x 2.2 cm mid renal cyst is present. A 1 cm echogenic area within the lower pole and 1.5 cm echogenic area within the mid left kidney are noted and could represent nonobstructing  calculi. Bladder: Appears normal for degree of bladder distention. IMPRESSION: Bilateral renal cysts and possible nonobstructing left renal calculi. Normal appearing kidneys and bladder otherwise. No evidence of hydronephrosis. Electronically Signed By: Margarette Canada M.D.  On: 11/06/2014 11:57   Dg Chest Portable 1 View  11/05/2014 CLINICAL DATA: Hyperglycemia and emesis. History of diabetes and hypertension. EXAM: PORTABLE CHEST - 1 VIEW COMPARISON: 05/29/2012 and prior chest radiographs dating back to 07/08/2003 FINDINGS: The cardiomediastinal silhouette is unchanged. Elevation of the right hemidiaphragm and right basilar scarring again noted. There is no evidence of focal airspace disease, pulmonary edema, suspicious pulmonary nodule/mass, pleural effusion, or pneumothorax. No acute bony abnormalities are identified. IMPRESSION: No active disease. Electronically Signed By: Margarette Canada M.D. On: 11/05/2014 17:23     CBC  Last Labs      Recent Labs Lab 11/05/14 1517 11/06/14 0411 11/07/14 0300 11/09/14 0404  WBC 13.4* 11.5* 9.4 6.2  HGB 14.2 13.8 13.1 12.9*  HCT 43.7 41.7 39.9 40.6  PLT 207 177 176 154  MCV 84.9 83.1 82.6 83.7  MCH 27.6 27.5 27.1 26.6  MCHC 32.5 33.1 32.8 31.8  RDW 14.5 14.7 14.9 14.9  LYMPHSABS 1.7 --  --  --   MONOABS 1.1* --  --  --   EOSABS 0.0 --  --  --   BASOSABS 0.0 --  --  --       Chemistries   Last Labs      Recent Labs Lab 11/05/14 1517  11/06/14 0830 11/06/14 1302 11/06/14 1642 11/07/14 0300 11/09/14 0404  NA 135 < > 143 143 143 142 141  K 5.4* < > 5.0 4.2 4.0 4.0 4.4  CL 96 < > 111 110 110 105 107  CO2 9* < > 18* 20 24 29  18*  GLUCOSE 427* < > 151* 221* 167* 183* 167*  BUN 54* < > 35* 27* 22 14 10   CREATININE 3.33* < > 1.84* 1.85* 1.61* 1.38* 1.51*  CALCIUM 9.0 < >  8.6 8.6 8.6 8.6 8.7  MG --  --  --  --  2.2 2.1 --   AST 19 --  --  --  --  18 --   ALT 33 --  --  --  --  24 --   ALKPHOS 99 --  --  --  --  78 --   BILITOT 1.8* --  --  --  --  0.8 --   < > = values in this interval not displayed.   ------------------------------------------------------------------------------------------------------------------ estimated creatinine clearance is 67.7 mL/min (by C-G formula based on Cr of 1.51). ------------------------------------------------------------------------------------------------------------------  Recent Labs (last 2 labs)      Recent Labs  11/08/14 0300  HGBA1C 13.6*     ------------------------------------------------------------------------------------------------------------------  Recent Labs (last 2 labs)     No results for input(s): CHOL, HDL, LDLCALC, TRIG, CHOLHDL, LDLDIRECT in the last 72 hours.   ------------------------------------------------------------------------------------------------------------------  Recent Labs (last 2 labs)     No results for input(s): TSH, T4TOTAL, T3FREE, THYROIDAB in the last 72 hours.  Invalid input(s): FREET3   ------------------------------------------------------------------------------------------------------------------  Recent Labs (last 2 labs)     No results for input(s): VITAMINB12, FOLATE, FERRITIN, TIBC, IRON, RETICCTPCT in the last 72 hours.    Coagulation profile  Last Labs     No results for input(s): INR, PROTIME in the last 168 hours.     Recent Labs (last 2 labs)     No results for input(s): DDIMER in the last 72 hours.    Cardiac Enzymes  Last Labs     No results for input(s): CKMB, TROPONINI, MYOGLOBIN in the last 168 hours.  Invalid input(s): CK   ------------------------------------------------------------------------------------------------------------------  Last Labs     Invalid  input(s): POCBNP      Rylee Nuzum D.O. on 11/10/2014 at 12:52 PM  Between 7am to 7pm - Pager [phone]  After 7pm go to [email or web address] - password [email or web address]  And look for the night coverage person covering for me after hours  [facility] Office [phone]       Revision History     Date/Time User Provider Type Action   11/10/2014 12:58 PM Reyne Dumas, MD Physician Addend   11/10/2014 12:57 PM Reyne Dumas, MD Physician Addend   11/10/2014 12:56 PM Reyne Dumas, MD Physician Sign   View Details Report

## 2014-11-11 ENCOUNTER — Inpatient Hospital Stay (HOSPITAL_COMMUNITY): Payer: Medicare Other

## 2014-11-11 ENCOUNTER — Inpatient Hospital Stay (HOSPITAL_COMMUNITY): Payer: Medicare Other | Admitting: Anesthesiology

## 2014-11-11 ENCOUNTER — Encounter (HOSPITAL_COMMUNITY)
Admission: EM | Disposition: A | Discharge status: Home / Self Care | Payer: Self-pay | Source: Home / Self Care | Attending: Internal Medicine

## 2014-11-11 HISTORY — PX: CHOLECYSTECTOMY: SHX55

## 2014-11-11 LAB — COMPREHENSIVE METABOLIC PANEL
ALBUMIN: 3 g/dL — AB (ref 3.5–5.2)
ALT: 19 U/L (ref 0–53)
AST: 14 U/L (ref 0–37)
Alkaline Phosphatase: 80 U/L (ref 39–117)
Anion gap: 19 — ABNORMAL HIGH (ref 5–15)
BUN: <5 mg/dL — ABNORMAL LOW (ref 6–23)
CALCIUM: 8.7 mg/dL (ref 8.4–10.5)
CO2: 16 mmol/L — AB (ref 19–32)
Chloride: 104 mmol/L (ref 96–112)
Creatinine, Ser: 1.36 mg/dL — ABNORMAL HIGH (ref 0.50–1.35)
GFR calc Af Amer: 61 mL/min — ABNORMAL LOW (ref >90)
GFR, EST NON AFRICAN AMERICAN: 52 mL/min — AB (ref >90)
Glucose, Bld: 256 mg/dL — ABNORMAL HIGH (ref 70–99)
Potassium: 4.2 mmol/L (ref 3.5–5.1)
SODIUM: 139 mmol/L (ref 135–145)
Total Bilirubin: 1.3 mg/dL — ABNORMAL HIGH (ref 0.3–1.2)
Total Protein: 6.2 g/dL (ref 6.0–8.3)

## 2014-11-11 LAB — GLUCOSE, CAPILLARY
Glucose-Capillary: 228 mg/dL — ABNORMAL HIGH (ref 70–99)
Glucose-Capillary: 232 mg/dL — ABNORMAL HIGH (ref 70–99)
Glucose-Capillary: 233 mg/dL — ABNORMAL HIGH (ref 70–99)
Glucose-Capillary: 385 mg/dL — ABNORMAL HIGH (ref 70–99)
Glucose-Capillary: 256 mg/dL — ABNORMAL HIGH (ref 70–99)

## 2014-11-11 LAB — CBC
HEMATOCRIT: 46.3 % (ref 39.0–52.0)
Hemoglobin: 15.3 g/dL (ref 13.0–17.0)
MCH: 27 pg (ref 26.0–34.0)
MCHC: 33 g/dL (ref 30.0–36.0)
MCV: 81.7 fL (ref 78.0–100.0)
PLATELETS: 146 10*3/uL — AB (ref 150–400)
RBC: 5.67 MIL/uL (ref 4.22–5.81)
RDW: 15.1 % (ref 11.5–15.5)
WBC: 5.9 10*3/uL (ref 4.0–10.5)

## 2014-11-11 SURGERY — COLONOSCOPY WITH PROPOFOL
Anesthesia: Monitor Anesthesia Care

## 2014-11-11 SURGERY — LAPAROSCOPIC CHOLECYSTECTOMY WITH INTRAOPERATIVE CHOLANGIOGRAM
Anesthesia: General | Site: Abdomen

## 2014-11-11 MED ORDER — NEOSTIGMINE METHYLSULFATE 10 MG/10ML IV SOLN
INTRAVENOUS | Status: DC | PRN
  Administered 2014-11-11: 5 mg via INTRAVENOUS

## 2014-11-11 MED ORDER — GLYCOPYRROLATE 0.2 MG/ML IJ SOLN
INTRAMUSCULAR | Status: AC
  Filled 2014-11-11: qty 4

## 2014-11-11 MED ORDER — ROCURONIUM BROMIDE 50 MG/5ML IV SOLN
INTRAVENOUS | Status: AC
  Filled 2014-11-11: qty 1

## 2014-11-11 MED ORDER — PHENYLEPHRINE HCL 10 MG/ML IJ SOLN
INTRAMUSCULAR | Status: DC | PRN
  Administered 2014-11-11: 160 ug via INTRAVENOUS
  Administered 2014-11-11: 80 ug via INTRAVENOUS
  Administered 2014-11-11 (×2): 160 ug via INTRAVENOUS
  Administered 2014-11-11: 80 ug via INTRAVENOUS

## 2014-11-11 MED ORDER — MIDAZOLAM HCL 2 MG/2ML IJ SOLN
INTRAMUSCULAR | Status: AC
  Filled 2014-11-11: qty 2

## 2014-11-11 MED ORDER — MIDAZOLAM HCL 5 MG/5ML IJ SOLN
INTRAMUSCULAR | Status: DC | PRN
  Administered 2014-11-11 (×2): 1 mg via INTRAVENOUS

## 2014-11-11 MED ORDER — SODIUM CHLORIDE 0.9 % IV SOLN
INTRAVENOUS | Status: DC | PRN
  Administered 2014-11-11: 20 mL

## 2014-11-11 MED ORDER — NEOSTIGMINE METHYLSULFATE 10 MG/10ML IV SOLN
INTRAVENOUS | Status: AC
  Filled 2014-11-11: qty 4

## 2014-11-11 MED ORDER — FENTANYL CITRATE (PF) 100 MCG/2ML IJ SOLN
INTRAMUSCULAR | Status: DC | PRN
  Administered 2014-11-11 (×2): 100 ug via INTRAVENOUS
  Administered 2014-11-11: 50 ug via INTRAVENOUS

## 2014-11-11 MED ORDER — SODIUM CHLORIDE 0.9 % IR SOLN
Status: DC | PRN
  Administered 2014-11-11 (×2): 1000 mL

## 2014-11-11 MED ORDER — PROPOFOL 10 MG/ML IV BOLUS
INTRAVENOUS | Status: DC | PRN
  Administered 2014-11-11: 160 mg via INTRAVENOUS
  Administered 2014-11-11: 30 mg via INTRAVENOUS

## 2014-11-11 MED ORDER — SUGAMMADEX SODIUM 500 MG/5ML IV SOLN
INTRAVENOUS | Status: DC | PRN
  Administered 2014-11-11: 200 mg via INTRAVENOUS

## 2014-11-11 MED ORDER — HYDROMORPHONE HCL 1 MG/ML IJ SOLN
0.2500 mg | INTRAMUSCULAR | Status: DC | PRN
  Administered 2014-11-11: 0.25 mg via INTRAVENOUS

## 2014-11-11 MED ORDER — HYDROMORPHONE HCL 1 MG/ML IJ SOLN
INTRAMUSCULAR | Status: AC
  Filled 2014-11-11: qty 1

## 2014-11-11 MED ORDER — ROCURONIUM BROMIDE 100 MG/10ML IV SOLN
INTRAVENOUS | Status: DC | PRN
  Administered 2014-11-11: 40 mg via INTRAVENOUS

## 2014-11-11 MED ORDER — VECURONIUM BROMIDE 10 MG IV SOLR
INTRAVENOUS | Status: DC | PRN
  Administered 2014-11-11 (×2): 2 mg via INTRAVENOUS

## 2014-11-11 MED ORDER — LACTATED RINGERS IV SOLN
INTRAVENOUS | Status: DC | PRN
  Administered 2014-11-11 (×2): via INTRAVENOUS

## 2014-11-11 MED ORDER — LABETALOL HCL 5 MG/ML IV SOLN
INTRAVENOUS | Status: AC
  Filled 2014-11-11: qty 4

## 2014-11-11 MED ORDER — FENTANYL CITRATE (PF) 250 MCG/5ML IJ SOLN
INTRAMUSCULAR | Status: AC
  Filled 2014-11-11: qty 5

## 2014-11-11 MED ORDER — SODIUM CHLORIDE 0.9 % IV SOLN
INTRAVENOUS | Status: DC
  Administered 2014-11-11 – 2014-11-13 (×3): via INTRAVENOUS

## 2014-11-11 MED ORDER — PHENYLEPHRINE 40 MCG/ML (10ML) SYRINGE FOR IV PUSH (FOR BLOOD PRESSURE SUPPORT)
PREFILLED_SYRINGE | INTRAVENOUS | Status: AC
  Filled 2014-11-11: qty 20

## 2014-11-11 MED ORDER — GLYCOPYRROLATE 0.2 MG/ML IJ SOLN
INTRAMUSCULAR | Status: DC | PRN
  Administered 2014-11-11: .8 mg via INTRAVENOUS

## 2014-11-11 MED ORDER — LIDOCAINE HCL (CARDIAC) 20 MG/ML IV SOLN
INTRAVENOUS | Status: DC | PRN
  Administered 2014-11-11: 60 mg via INTRAVENOUS

## 2014-11-11 MED ORDER — LIDOCAINE HCL (CARDIAC) 20 MG/ML IV SOLN
INTRAVENOUS | Status: AC
  Filled 2014-11-11: qty 5

## 2014-11-11 MED ORDER — OXYCODONE-ACETAMINOPHEN 5-325 MG PO TABS
1.0000 | ORAL_TABLET | ORAL | Status: DC | PRN
  Administered 2014-11-11 – 2014-11-13 (×5): 1 via ORAL
  Filled 2014-11-11 (×8): qty 1

## 2014-11-11 MED ORDER — BUPIVACAINE-EPINEPHRINE (PF) 0.25% -1:200000 IJ SOLN
INTRAMUSCULAR | Status: AC
  Filled 2014-11-11: qty 30

## 2014-11-11 MED ORDER — VECURONIUM BROMIDE 10 MG IV SOLR
INTRAVENOUS | Status: AC
  Filled 2014-11-11: qty 10

## 2014-11-11 MED ORDER — PROPOFOL 10 MG/ML IV BOLUS
INTRAVENOUS | Status: AC
  Filled 2014-11-11: qty 20

## 2014-11-11 MED ORDER — ONDANSETRON HCL 4 MG/2ML IJ SOLN
INTRAMUSCULAR | Status: DC | PRN
  Administered 2014-11-11: 4 mg via INTRAVENOUS

## 2014-11-11 MED ORDER — BUPIVACAINE-EPINEPHRINE 0.25% -1:200000 IJ SOLN
INTRAMUSCULAR | Status: DC | PRN
  Administered 2014-11-11: 30 mL

## 2014-11-11 MED ORDER — HEMOSTATIC AGENTS (NO CHARGE) OPTIME
TOPICAL | Status: DC | PRN
  Administered 2014-11-11: 1

## 2014-11-11 MED ORDER — ONDANSETRON HCL 4 MG/2ML IJ SOLN
INTRAMUSCULAR | Status: AC
  Filled 2014-11-11: qty 2

## 2014-11-11 MED ORDER — LABETALOL HCL 5 MG/ML IV SOLN
INTRAVENOUS | Status: DC | PRN
  Administered 2014-11-11: 5 mg via INTRAVENOUS

## 2014-11-11 MED ORDER — 0.9 % SODIUM CHLORIDE (POUR BTL) OPTIME
TOPICAL | Status: DC | PRN
  Administered 2014-11-11 (×2): 1000 mL

## 2014-11-11 MED ORDER — NEOSTIGMINE METHYLSULFATE 10 MG/10ML IV SOLN
INTRAVENOUS | Status: AC
  Filled 2014-11-11: qty 1

## 2014-11-11 SURGICAL SUPPLY — 49 items
APPLIER CLIP ROT 10 11.4 M/L (STAPLE) ×3
APR CLP MED LRG 11.4X10 (STAPLE) ×1
BAG SPEC RTRVL LRG 6X4 10 (ENDOMECHANICALS) ×1
BLADE SURG ROTATE 9660 (MISCELLANEOUS) IMPLANT
CANISTER SUCTION 2500CC (MISCELLANEOUS) ×3 IMPLANT
CHLORAPREP W/TINT 26ML (MISCELLANEOUS) ×3 IMPLANT
CLIP APPLIE ROT 10 11.4 M/L (STAPLE) ×1 IMPLANT
COVER MAYO STAND STRL (DRAPES) ×3 IMPLANT
COVER SURGICAL LIGHT HANDLE (MISCELLANEOUS) ×5 IMPLANT
DRAPE C-ARM 42X72 X-RAY (DRAPES) ×3 IMPLANT
DRAPE LAPAROSCOPIC ABDOMINAL (DRAPES) ×3 IMPLANT
DRAPE WARM FLUID 44X44 (DRAPE) ×3 IMPLANT
ELECT REM PT RETURN 9FT ADLT (ELECTROSURGICAL) ×3
ELECTRODE REM PT RTRN 9FT ADLT (ELECTROSURGICAL) ×1 IMPLANT
GLOVE BIO SURGEON STRL SZ 6.5 (GLOVE) ×2 IMPLANT
GLOVE BIO SURGEON STRL SZ8 (GLOVE) ×3 IMPLANT
GLOVE BIO SURGEONS STRL SZ 6.5 (GLOVE) ×2
GLOVE BIOGEL PI IND STRL 6.5 (GLOVE) IMPLANT
GLOVE BIOGEL PI IND STRL 7.0 (GLOVE) IMPLANT
GLOVE BIOGEL PI IND STRL 8 (GLOVE) ×1 IMPLANT
GLOVE BIOGEL PI INDICATOR 6.5 (GLOVE) ×4
GLOVE BIOGEL PI INDICATOR 7.0 (GLOVE) ×4
GLOVE BIOGEL PI INDICATOR 8 (GLOVE) ×2
GLOVE SURG SS PI 7.0 STRL IVOR (GLOVE) ×2 IMPLANT
GOWN STRL REUS W/ TWL LRG LVL3 (GOWN DISPOSABLE) ×2 IMPLANT
GOWN STRL REUS W/ TWL XL LVL3 (GOWN DISPOSABLE) ×1 IMPLANT
GOWN STRL REUS W/TWL LRG LVL3 (GOWN DISPOSABLE) ×6
GOWN STRL REUS W/TWL XL LVL3 (GOWN DISPOSABLE) ×3
HEMOSTAT SNOW SURGICEL 2X4 (HEMOSTASIS) ×2 IMPLANT
KIT BASIN OR (CUSTOM PROCEDURE TRAY) ×3 IMPLANT
KIT ROOM TURNOVER OR (KITS) ×3 IMPLANT
LIQUID BAND (GAUZE/BANDAGES/DRESSINGS) ×3 IMPLANT
NS IRRIG 1000ML POUR BTL (IV SOLUTION) ×3 IMPLANT
PAD ARMBOARD 7.5X6 YLW CONV (MISCELLANEOUS) ×3 IMPLANT
POUCH SPECIMEN RETRIEVAL 10MM (ENDOMECHANICALS) ×3 IMPLANT
SCISSORS LAP 5X35 DISP (ENDOMECHANICALS) ×3 IMPLANT
SET CHOLANGIOGRAPH 5 50 .035 (SET/KITS/TRAYS/PACK) ×3 IMPLANT
SET IRRIG TUBING LAPAROSCOPIC (IRRIGATION / IRRIGATOR) ×3 IMPLANT
SLEEVE ENDOPATH XCEL 5M (ENDOMECHANICALS) ×3 IMPLANT
SPECIMEN JAR SMALL (MISCELLANEOUS) ×3 IMPLANT
SUT MNCRL AB 4-0 PS2 18 (SUTURE) ×3 IMPLANT
TOWEL OR 17X24 6PK STRL BLUE (TOWEL DISPOSABLE) ×3 IMPLANT
TOWEL OR 17X26 10 PK STRL BLUE (TOWEL DISPOSABLE) ×3 IMPLANT
TRAY LAPAROSCOPIC (CUSTOM PROCEDURE TRAY) ×3 IMPLANT
TROCAR XCEL BLADELESS 5X75MML (TROCAR) IMPLANT
TROCAR XCEL BLUNT TIP 100MML (ENDOMECHANICALS) ×3 IMPLANT
TROCAR XCEL NON-BLD 11X100MML (ENDOMECHANICALS) ×3 IMPLANT
TROCAR XCEL NON-BLD 5MMX100MML (ENDOMECHANICALS) ×3 IMPLANT
TUBING INSUFFLATION (TUBING) ×3 IMPLANT

## 2014-11-11 NOTE — Anesthesia Postprocedure Evaluation (Signed)
  Anesthesia Post-op Note  Patient: Jay Holloway, Jay Holloway  Procedure(s) Performed: Procedure(s): LAPAROSCOPIC CHOLECYSTECTOMY WITH INTRAOPERATIVE CHOLANGIOGRAM (N/A)  Patient Location: PACU  Anesthesia Type:General  Level of Consciousness: awake  Airway and Oxygen Therapy: Patient Spontanous Breathing  Post-op Pain: mild  Post-op Assessment: Post-op Vital signs reviewed  Post-op Vital Signs: Reviewed  Last Vitals:  Filed Vitals:   11/11/14 1153  BP: 142/81  Pulse: 93  Temp:   Resp: 16    Complications: No apparent anesthesia complications

## 2014-11-11 NOTE — Progress Notes (Signed)
Patient left for surgery at this time. Alert and in stable condition.

## 2014-11-11 NOTE — Progress Notes (Signed)
Physical Therapy Note  Patient taken to OR for procedure. PT will continue to follow.  [place], [place]  11/11/2014 - - 10:28 AM

## 2014-11-11 NOTE — Transfer of Care (Signed)
Immediate Anesthesia Transfer of Care Note  Patient: Jay Holloway  Procedure(s) Performed: Procedure(s): LAPAROSCOPIC CHOLECYSTECTOMY WITH INTRAOPERATIVE CHOLANGIOGRAM (N/A)  Patient Location: PACU  Anesthesia Type:General  Level of Consciousness: awake, alert  and oriented  Airway & Oxygen Therapy: Patient Spontanous Breathing and Patient connected to face mask oxygen  Post-op Assessment: Report given to RN, Post -op Vital signs reviewed and stable and Patient moving all extremities  Post vital signs: Reviewed and stable  Last Vitals:  Filed Vitals:   11/11/14 1139  BP: 153/80  Pulse: 106  Temp:   Resp: 20    Complications: No apparent anesthesia complications

## 2014-11-11 NOTE — H&P (View-Only) (Signed)
[facility] Consult Note  Jay Holloway July 16, 1946  [id number].    Requesting MD: Dr. Allyson Sabal Chief Complaint/Reason for Consult: upper bdominal pain, nausea  HPI:  69 year old with PMH of type 2 diabetes mellitus (uncontrolled Hgb A1c 13.6), h/o prostate cancer s/p prostatectomy, HLD, HTN who presented to [facility] secondary to elevated blood surgars at home with nausea, weakness, and found to have DKA. Patient was initially admitted by PCCM, as he was found to be acidotic, have hypotension, with Kussmal respirations. Patient improved and was sent to medical floor, triad hospitalist assumed care.  DKA has improved.  Patient also found to have bacteremia, sepsis secondary to urinary tract infection, currently on vancomycin.  After admission he began to have upper abdominal pain (he points to a band around the upper abdomen).  Pt denies ever having trouble with abdominal pain/discomfort.  He says he can normally eat anything he wants.  He describes the pain as a burning pain in the upper abdomen which inhibits him from eating almost anything at this point.  No fever, chills, vomiting, diarrhea, constipation, dysuria, frequency, urgency.  No sick contacts, no traveling, no excessive caffeine, alcohol, tobacco, or NSAIDS.   ROS: All systems reviewed and otherwise negative except for as above  History reviewed. No pertinent family history.  Past Medical History  Diagnosis Date  . Hypertension   . Diabetes mellitus without complication   . Hemorrhoids   . High cholesterol   . Arthritis     trouble turning head side to side and up  . Prostate cancer     Past Surgical History  Procedure Laterality Date  . Right  achilles tendon repair  yrs ago  . Robot assisted laparoscopic radical prostatectomy  06/04/2012    Procedure: ROBOTIC ASSISTED LAPAROSCOPIC RADICAL PROSTATECTOMY LEVEL 3;  Surgeon: Dutch Gray, MD;  Location: [facility];  Service: Urology;  Laterality: N/A;  . Lymphadenectomy   06/04/2012    Procedure: LYMPHADENECTOMY;  Surgeon: Dutch Gray, MD;  Location: [facility];  Service: Urology;  Laterality: Bilateral;  . Cystoscopy with retrograde pyelogram, ureteroscopy and stent placement Left 04/18/2014    Procedure: [place], URETEROSCOPY AND STENT PLACEMENT;  Surgeon: Raynelle Bring, MD;  Location: [facility];  Service: Urology;  Laterality: Left;  . Holmium laser application Left 7/62/8315    Procedure: HOLMIUM LASER APPLICATION;  Surgeon: Raynelle Bring, MD;  Location: [facility];  Service: Urology;  Laterality: Left;  . Colonoscopy N/A 11/09/2014    Procedure: COLONOSCOPY;  Surgeon: Juanita Craver, MD;  Location: [facility] ENDOSCOPY;  Service: Endoscopy;  Laterality: N/A;    Social History:  reports that he quit smoking about 35 years ago. His smoking use included Cigarettes. He has a 1 pack-year smoking history. He has never used smokeless tobacco. He reports that he does not drink alcohol or use illicit drugs.  Allergies: No Known Allergies  Medications Prior to Admission  Medication Sig Dispense Refill  . amLODipine (NORVASC) 10 MG tablet Take 10 mg by mouth daily.    Marland Kitchen glimepiride (AMARYL) 2 MG tablet Take 2 mg by mouth daily before breakfast.    . HYDROcodone-acetaminophen (NORCO) 5-325 MG per tablet Take 1-2 tablets by mouth every 6 (six) hours as needed for pain. 30 tablet 0  . indomethacin (INDOCIN SR) 75 MG CR capsule Take 75 mg by mouth daily at 10 pm.    . irbesartan (AVAPRO) 300 MG tablet Take 300 mg by mouth daily.  1  . Liraglutide (VICTOZA) 18 MG/3ML SOLN  Inject 1.8 mg into the skin daily.    . metFORMIN (GLUCOPHAGE) 500 MG tablet Take 1,000 mg by mouth 2 (two) times daily with a meal.     . naproxen (NAPROSYN) 500 MG tablet Take 500 mg by mouth daily. Patient states takes only 1 daily    . ONGLYZA 5 MG TABS tablet Take 5 mg by mouth daily.  0  . rosuvastatin (CRESTOR) 10 MG tablet Take 10 mg by mouth every morning.    . traMADol (ULTRAM) 50 MG  tablet Take 50 mg by mouth every 6 (six) hours as needed for moderate pain.    . BD PEN NEEDLE NANO U/F 32G X 4 MM MISC   0  . ciprofloxacin (CIPRO) 500 MG tablet Take 1 tablet (500 mg total) by mouth 2 (two) times daily. (Patient not taking: Reported on 11/05/2014) 20 tablet 0  . HYDROcodone-acetaminophen (NORCO/VICODIN) 5-325 MG per tablet Take 1-2 tablets by mouth every 6 (six) hours as needed. 35 tablet 0    Blood pressure 113/76, pulse 97, temperature 98.8 F (37.1 C), temperature source Oral, resp. rate 19, height _0  (1.93 m), weight 121.8 kg (268 lb 8.3 oz), SpO2 98 %. Physical Exam: General: pleasant, WD/WN AA male who is laying in bed in NAD HEENT: head is normocephalic, atraumatic.  Sclera are noninjected.  PERRL.  Ears and nose without any masses or lesions.  Mouth is pink and moist Heart: regular, rate, and rhythm.  No obvious murmurs, gallops, or rubs noted.  Palpable pedal pulses bilaterally Lungs: CTAB, no wheezes, rhonchi, or rales noted.  Respiratory effort nonlabored Abd: soft, ND, tender in epigastrium and RUQ/LUQ minimally, +BS, no masses, hernias, or organomegaly, scar near central lower abdomen from prostate surgery, well healed. MS: all 4 extremities are symmetrical with no cyanosis, clubbing, or edema. Skin: warm and dry with no masses, lesions, or rashes Psych: A&Ox3 with an appropriate affect.   Results for orders placed or performed during the hospital encounter of 11/05/14 (from the past 48 hour(s))  Glucose, capillary     Status: Abnormal   Collection Time: 11/08/14  4:52 PM  Result Value Ref Range   Glucose-Capillary 160 (H) 70 - 99 mg/dL  Glucose, capillary     Status: Abnormal   Collection Time: 11/08/14 10:01 PM  Result Value Ref Range   Glucose-Capillary 182 (H) 70 - 99 mg/dL   Comment 1 Notify RN    Comment 2 Document in Chart   Basic metabolic panel     Status: Abnormal   Collection Time: 11/09/14  4:04 AM  Result Value Ref Range   Sodium 141 135  - 145 mmol/L   Potassium 4.4 3.5 - 5.1 mmol/L   Chloride 107 96 - 112 mmol/L   CO2 18 (L) 19 - 32 mmol/L   Glucose, Bld 167 (H) 70 - 99 mg/dL   BUN 10 6 - 23 mg/dL   Creatinine, Ser 1.51 (H) 0.50 - 1.35 mg/dL   Calcium 8.7 8.4 - 10.5 mg/dL   GFR calc non Af Amer 46 (L) >90 mL/min   GFR calc Af Amer 53 (L) >90 mL/min    Comment: (NOTE) The eGFR has been calculated using the CKD EPI equation. This calculation has not been validated in all clinical situations. eGFR's persistently <90 mL/min signify possible Chronic Kidney Disease.    Anion gap 16 (H) 5 - 15  CBC     Status: Abnormal   Collection Time: 11/09/14  4:04 AM  Result  Value Ref Range   WBC 6.2 4.0 - 10.5 K/uL   RBC 4.85 4.22 - 5.81 MIL/uL   Hemoglobin 12.9 (L) 13.0 - 17.0 g/dL   HCT 40.6 39.0 - 52.0 %   MCV 83.7 78.0 - 100.0 fL   MCH 26.6 26.0 - 34.0 pg   MCHC 31.8 30.0 - 36.0 g/dL   RDW 14.9 11.5 - 15.5 %   Platelets 154 150 - 400 K/uL  Glucose, capillary     Status: Abnormal   Collection Time: 11/09/14  7:34 AM  Result Value Ref Range   Glucose-Capillary 181 (H) 70 - 99 mg/dL  Glucose, capillary     Status: Abnormal   Collection Time: 11/09/14 11:36 AM  Result Value Ref Range   Glucose-Capillary 151 (H) 70 - 99 mg/dL  Glucose, capillary     Status: Abnormal   Collection Time: 11/09/14  5:19 PM  Result Value Ref Range   Glucose-Capillary 162 (H) 70 - 99 mg/dL  Glucose, capillary     Status: Abnormal   Collection Time: 11/09/14  9:30 PM  Result Value Ref Range   Glucose-Capillary 249 (H) 70 - 99 mg/dL  Glucose, capillary     Status: Abnormal   Collection Time: 11/10/14  7:24 AM  Result Value Ref Range   Glucose-Capillary 224 (H) 70 - 99 mg/dL  Comprehensive metabolic panel     Status: Abnormal   Collection Time: 11/10/14 11:10 AM  Result Value Ref Range   Sodium 140 135 - 145 mmol/L   Potassium 3.4 (L) 3.5 - 5.1 mmol/L   Chloride 104 96 - 112 mmol/L   CO2 21 19 - 32 mmol/L   Glucose, Bld 218 (H) 70 - 99  mg/dL   BUN <5 (L) 6 - 23 mg/dL   Creatinine, Ser 1.38 (H) 0.50 - 1.35 mg/dL   Calcium 9.3 8.4 - 10.5 mg/dL   Total Protein 7.0 6.0 - 8.3 g/dL   Albumin 3.3 (L) 3.5 - 5.2 g/dL   AST 15 0 - 37 U/L   ALT 22 0 - 53 U/L   Alkaline Phosphatase 86 39 - 117 U/L   Total Bilirubin 1.3 (H) 0.3 - 1.2 mg/dL   GFR calc non Af Amer 51 (L) >90 mL/min   GFR calc Af Amer 60 (L) >90 mL/min    Comment: (NOTE) The eGFR has been calculated using the CKD EPI equation. This calculation has not been validated in all clinical situations. eGFR's persistently <90 mL/min signify possible Chronic Kidney Disease.    Anion gap 15 5 - 15  Lipase, blood     Status: None   Collection Time: 11/10/14 11:10 AM  Result Value Ref Range   Lipase 24 11 - 59 U/L   Ct Abdomen Pelvis Wo Contrast  11/10/2014   CLINICAL DATA:  Acute onset abdominal pain and nausea. Diabetic ketoacidosis.  EXAM: CT ABDOMEN AND PELVIS WITHOUT CONTRAST  TECHNIQUE: Multidetector CT imaging of the abdomen and pelvis was performed following the standard protocol without IV contrast.  COMPARISON:  CT abdomen and pelvis 10/08/2014.  FINDINGS: There is some mild stress of mild dependent atelectasis is seen the right lung base. Elevation of the right hemidiaphragm is noted. The lung bases are otherwise clear. No pleural or pericardial effusion. Heart size is normal. Calcific coronary artery disease is noted.  Four nonobstructing left renal stones are identified. The largest is in the midpole measuring 0.4 cm. Two small nonobstructing right renal stones are also identified. Bilateral renal cysts  are unchanged. Small stones are seen layering dependently within the gallbladder. Stranding of fat is seen interposed between the inferior margin of the gallbladder and along the lateral margin of the descending duodenum. The spleen, adrenal glands and pancreas appear normal.  The stomach, colon and appendix appear normal. No lymphadenopathy or fluid collection is  identified. The patient is status post prostatectomy.  Flowing syndesmophytes throughout the imaged spine and fusion of the posterior elements is most suggestive of ankylosing spondylitis. No lytic or sclerotic bony lesion is identified.  IMPRESSION: Stranding interposed between the inferior margin of the gallbladder and descending duodenum could be secondary to acute cholecystitis in this patient with small gallstones. Duodenitis is also possible. No focal fluid collection or bowel perforation is seen. Right upper quadrant ultrasound could be used for further evaluation.  Bilateral nonobstructing renal stones.  Calcific aortic and coronary atherosclerosis.  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   Electronically Signed   By: Inge Rise M.D.   On: 11/10/2014 13:30      Assessment/Plan Biliary colic/symptomatic gallstones -Ordered clears, NPO MN -Hold blood thinners after MN -Order [place] and if it confirms cholecystitis we will proceed with lap chole with IOC tomorrow am -Ambulate and IS -SCD's and heparin (will be held) -Pain control, antiemetics, IVF -Pt already started on IV Zosyn  Sepsis due to UTI vs Bacteremia (urine Staph coag neg, blood bacillus species) HTN Uncontrolled DM with Hgb A1C >13 Hyperlipidemia Arthritis H/o prostate cancer s/p prostatectomy 05/2012   Coralie Keens, [facility] 11/10/2014, 2:31 PM Pager: [phone]

## 2014-11-11 NOTE — Progress Notes (Signed)
Inpatient Diabetes Program Recommendations  AACE/ADA: New Consensus Statement on Inpatient Glycemic Control (2013)  Target Ranges:  Prepandial:   less than 140 mg/dL      Peak postprandial:   less than 180 mg/dL (1-2 hours)      Critically ill patients:  140 - 180 mg/dL   Reason for Visit: Hyperglycemia  Results for CARMICHAEL, BURDETTE (MRN [id number]) as of 11/11/2014 18:21  Ref. Range 11/10/2014 07:24 11/10/2014 13:23 11/10/2014 17:38 11/10/2014 21:51 11/11/2014 07:52 11/11/2014 09:10 11/11/2014 11:40 11/11/2014 16:52  Glucose-Capillary Latest Ref Range: 70-99 mg/dL 224 (H) 227 (H) 227 (H) 198 (H) 228 (H) 232 (H) 233 (H) 385 (H)   Blood sugars in 200-300s over past 24 hours d/t No Lantus given x past 3 days.  Needs basal insulin. Needs improved blood sugars for healing.  Please begin Lantus 20 units QHS starting tonight. D/C Lantus 20 units QAM.  Thank you. Lorenda Peck, RD, LDN, CDE Inpatient Diabetes Coordinator [phone]

## 2014-11-11 NOTE — Progress Notes (Signed)
Expand All Collapse All    [facility]    Patient Demographics  Jay Holloway, is a 69 y.o. male, DOB - November 16, 1945, [id number]  Admit date - 11/05/2014 Admitting Physician Chesley Mires, MD  Outpatient Primary MD for the patient is [name], MD  LOS - 5   Chief Complaint  Patient presents with  . Hyperglycemia  . Emesis  . Diabetic Ketoacidosis     Interim history 69 year old with history of type 2 diabetes mellitus, percent with nausea, vomiting, weakness, and found to have DKA. Patient was initially admitted by PCCM, as he was found to be acidotic, have hypotension, with Kussmal respirations. Patient improved and was sent to medical floor. DKA has improved. HAI has improved. Patient also found to have bacteremia, sepsis secondary to urinary tract infection, currently on vancomycin. PT recommended home health therapy. Diabetes coordinator consulted. Hemoglobin A1c 13.6   Assessment & Plan   Sepsis secondary to UTI vs Bacteremia -Upon admission, patient did have leukocytosis with tachycardia. -Blood cultures from 11/05/2014: Bacillus species (1/2) -Urine culture showed Staphylococcus, coag negative Initially on vancomycin, switched to cefazolin, discussed with infectious disease Dr. Drucilla Schmidt 5 days of cefazolin/vancomycin as adequate treatment for this Bacillus species which could possibly be a contaminant -Patient had a colonoscopy in 2012 and is due for one in Jan 2017 (spoke with Dr. Collene Mares), Dr. Dallas Breeding. Collene Mares attempted to perform colonoscopy  4/13,Solid stool was noted throughout the left colon and rectum. The procedure was aborted  Since bacillus cereus could very well be a contaminant and patient has had adequate therapy , he will be discharged and follow up with Dr Collene Mares outpt    Diabetic ketoacidosis/diabetes mellitus Outpatient Diabetes medications: Amaryl 2 mg,  Victoza 1.8 ml daily, Metformin 1000 mg bid, Onglyza 5 mg daily -Resolved, gap has closed. -Hemoglobin A1c 13.6 Diabetes coordinator . All by mouth medications stopped Patient has been switched to Levemir and NovoLog FlexPen     Hypotension -Resolved, likely secondary to above processes  Acute kidney injury -Likely secondary to DKA versus sepsis versus hypotension -Renal ultrasound: No hydronephrosis, nonobstructive left caliculi  Hematuria/history of prostate cancer -Will need to follow up with urologist at discharge -Hemoglobin currently stable  Abdominal pain, nausea, vomiting, recurrence today Initially thought to be secondary to DKA Given that the patient's colonoscopy had to be aborted, will do a CT scan of the abdomen/pelvis to rule out other possibilities including pancreatitis/mass/obstruction Suspected cholecystitis on CT scan ,start zosyn, NPO , surgery consult , they recommend the following LAPAROSCOPIC CHOLECYSTECTOMY WITH INTRAOPERATIVE CHOLANGIOGRAM (N/A) as a surgical intervention  today   Leukocytosis -Likely secondary to DKA vs sepsis -Resolved  Code Status: Full Discharge -anticipate that the patient will stay over the weekend, and discharge on Monday         Time Spent in minutes 35 minutes  Procedures  Renal US  Consults  PCCM Gastroenterology ([facility])  DVT Prophylaxis Heparin   Recent Labs    Lab Results  Component Value Date   PLT 154 11/09/2014      Medications  Scheduled Meds: . antiseptic oral rinse 7 mL Mouth Rinse q12n4p  . ceFAZolin (ANCEF) IV 1 g Intravenous 3 times per day  . chlorhexidine 15 mL Mouth Rinse BID  . heparin 5,000 Units Subcutaneous 3 times per day  . insulin aspart 0-15 Units Subcutaneous TID WC  . insulin aspart 0-5 Units Subcutaneous QHS  . insulin glargine 20 Units Subcutaneous Daily  . polyethylene glycol 17 g Oral BID  Continuous Infusions: . sodium chloride Stopped (11/08/14 0010)  . sodium chloride    PRN Meds:.  Antibiotics   Anti-infectives    Start   Dose/Rate Route Frequency Ordered Stop   11/09/14 1400  ceFAZolin (ANCEF) IVPB 1 g/50 mL premix    1 g 100 mL/hr over 30 Minutes Intravenous 3 times per day 11/09/14 0739    11/09/14 0600  ceFAZolin (ANCEF) IVPB 1 g/50 mL premix Status: Discontinued    1 g 100 mL/hr over 30 Minutes Intravenous 4 times per day 11/08/14 1526 11/09/14 0739   11/08/14 1300  vancomycin (VANCOCIN) 1,500 mg in sodium chloride 0.9 % 500 mL IVPB Status: Discontinued    1,500 mg 250 mL/hr over 120 Minutes Intravenous Every 24 hours 11/07/14 1142 11/08/14 1526   11/07/14 1145  vancomycin (VANCOCIN) 2,000 mg in sodium chloride 0.9 % 500 mL IVPB    2,000 mg 250 mL/hr over 120 Minutes Intravenous Once 11/07/14 1142 11/07/14 1520   11/05/14 2100  cefTRIAXone (ROCEPHIN) 1 g in dextrose 5 % 50 mL IVPB - Premix Status: Discontinued    1 g 100 mL/hr over 30 Minutes Intravenous Every 24 hours 11/05/14 1952 11/07/14 1003        Subjective:   Jay Holloway patient complaining of epigastric/bilateral lower quadrant abdominal pain, scheduled to go to the OR  today  Objective:   Filed Vitals:   11/09/14 1600 11/09/14 1629 11/09/14 2058 11/10/14 0610  BP: 136/87 129/85 111/65 113/76  Pulse: 100 93 96 97  Temp:  97.7 F (36.5 C) 98.1 F (36.7 C) 98.8 F (37.1 C)  TempSrc:  Oral Oral Oral  Resp: 23 20 17 19   Height:      Weight:      SpO2: 96% 97% 97% 98%    Wt Readings from Last 3 Encounters:  11/09/14 121.8 kg (268 lb 8.3 oz)  04/18/14 139.056 kg (306 lb 9 oz)  06/04/12 146.512 kg (323 lb)     Intake/Output Summary (Last 24 hours) at 11/10/14 1252 Last data filed at 11/10/14 1243  Gross per 24 hour  Intake   480 ml  Output  550 ml  Net  -70 ml    Exam  General: Well developed, well nourished, NAD, appears stated age  61: NCAT,mucous membranes moist.   Cardiovascular: S1 S2 auscultated, no rubs, murmurs or gallops. Regular rate and rhythm.  Respiratory: Clear to auscultation bilaterally with equal chest rise  Abdomen: Soft, diffuse abdominal pain, nondistended, + bowel sounds  Extremities: warm dry without cyanosis clubbing or edema  Neuro: AAOx3, nonfocal  Psych: Normal affect and demeanor  Data Review   Micro Results Recent Results (from the past 240 hour(s))  Blood culture (routine x 2) Status: None   Collection Time: 11/05/14 4:54 PM  Result Value Ref Range Status   Specimen Description BLOOD LEFT HAND  Final   Special Requests BOTTLES DRAWN AEROBIC AND ANAEROBIC 4CC  Final   Culture   Final    BACILLUS SPECIES Note: Standardized susceptibility testing for this organism is not available. Note: Gram Stain Report Called to,Read Back By and Verified With: JESSIE CLEAVER ON 4.10.16 @ 3810 BY Warren Performed at [facility]    Report Status 11/07/2014 FINAL  Final  Urine culture Status: None   Collection Time: 11/05/14 5:12 PM  Result Value Ref Range Status   Specimen Description URINE, RANDOM  Final   Special Requests ADDED 2354  Final   Colony Count   Final  40,000 COLONIES/ML Performed at [facility]   Final    STAPHYLOCOCCUS SPECIES (COAGULASE NEGATIVE) Note: RIFAMPIN AND GENTAMICIN SHOULD NOT BE USED AS SINGLE DRUGS FOR TREATMENT OF STAPH INFECTIONS. Performed at [facility]    Report Status 11/08/2014 FINAL  Final   Organism ID, Bacteria STAPHYLOCOCCUS SPECIES (COAGULASE NEGATIVE)  Final   Susceptibility   Staphylococcus species (coagulase negative) - MIC*    GENTAMICIN <=0.5 SENSITIVE Sensitive      LEVOFLOXACIN >=8 RESISTANT Resistant     NITROFURANTOIN <=16 SENSITIVE Sensitive     OXACILLIN <=0.25 SENSITIVE Sensitive     PENICILLIN >=0.5 RESISTANT Resistant     RIFAMPIN <=0.5 SENSITIVE Sensitive     TRIMETH/SULFA <=10 SENSITIVE Sensitive     VANCOMYCIN 1 SENSITIVE Sensitive     TETRACYCLINE 2 SENSITIVE Sensitive    * STAPHYLOCOCCUS SPECIES (COAGULASE NEGATIVE)  MRSA PCR Screening Status: None   Collection Time: 11/05/14 8:00 PM  Result Value Ref Range Status   MRSA by PCR NEGATIVE NEGATIVE Final    Comment:   The GeneXpert MRSA Assay (FDA approved for NASAL specimens only), is one component of a comprehensive MRSA colonization surveillance program. It is not intended to diagnose MRSA infection nor to guide or monitor treatment for MRSA infections.   Blood culture (routine x 2) Status: None (Preliminary result)   Collection Time: 11/05/14 8:34 PM  Result Value Ref Range Status   Specimen Description BLOOD RIGHT HAND  Final   Special Requests BOTTLES DRAWN AEROBIC AND ANAEROBIC 1 CC  Final   Culture   Final     BLOOD CULTURE RECEIVED NO GROWTH TO DATE CULTURE WILL BE HELD FOR 5 DAYS BEFORE ISSUING A FINAL NEGATIVE REPORT Performed at [facility]    Report Status PENDING  Incomplete    Radiology Reports  Imaging Results    US Renal  11/06/2014 CLINICAL DATA: 70 year old male with renal failure. History of hypertension, diabetes and prostate cancer. EXAM: RENAL/URINARY TRACT ULTRASOUND COMPLETE COMPARISON: None. FINDINGS: Right Kidney: Length: 13 cm. A 6 x 7.6 cm cyst extending off of the lower pole is identified. Echogenicity within normal limits. No solid mass or hydronephrosis visualized. Left Kidney: Length: 13.7 cm. Echogenicity within normal limits. A 2.6 x 2.2 cm mid renal cyst is present. A 1 cm echogenic area within the lower pole  and 1.5 cm echogenic area within the mid left kidney are noted and could represent nonobstructing calculi. Bladder: Appears normal for degree of bladder distention. IMPRESSION: Bilateral renal cysts and possible nonobstructing left renal calculi. Normal appearing kidneys and bladder otherwise. No evidence of hydronephrosis. Electronically Signed By: Margarette Canada M.D.  On: 11/06/2014 11:57   Dg Chest Portable 1 View  11/05/2014 CLINICAL DATA: Hyperglycemia and emesis. History of diabetes and hypertension. EXAM: PORTABLE CHEST - 1 VIEW COMPARISON: 05/29/2012 and prior chest radiographs dating back to 07/08/2003 FINDINGS: The cardiomediastinal silhouette is unchanged. Elevation of the right hemidiaphragm and right basilar scarring again noted. There is no evidence of focal airspace disease, pulmonary edema, suspicious pulmonary nodule/mass, pleural effusion, or pneumothorax. No acute bony abnormalities are identified. IMPRESSION: No active disease. Electronically Signed By: Margarette Canada M.D. On: 11/05/2014 17:23     CBC  Last Labs      Recent Labs Lab 11/05/14 1517 11/06/14 0411 11/07/14 0300 11/09/14 0404  WBC 13.4* 11.5* 9.4 6.2  HGB 14.2 13.8 13.1 12.9*  HCT 43.7 41.7 39.9 40.6  PLT 207 177 176 154  MCV  84.9 83.1 82.6 83.7  MCH 27.6 27.5 27.1 26.6  MCHC 32.5 33.1 32.8 31.8  RDW 14.5 14.7 14.9 14.9  LYMPHSABS 1.7 --  --  --   MONOABS 1.1* --  --  --   EOSABS 0.0 --  --  --   BASOSABS 0.0 --  --  --       Chemistries   Last Labs      Recent Labs Lab 11/05/14 1517  11/06/14 0830 11/06/14 1302 11/06/14 1642 11/07/14 0300 11/09/14 0404  NA 135 < > 143 143 143 142 141  K 5.4* < > 5.0 4.2 4.0 4.0 4.4  CL 96 < > 111 110 110 105 107  CO2 9* < > 18* 20 24 29  18*  GLUCOSE 427* < > 151* 221* 167* 183* 167*  BUN 54* < > 35* 27*  22 14 10   CREATININE 3.33* < > 1.84* 1.85* 1.61* 1.38* 1.51*  CALCIUM 9.0 < > 8.6 8.6 8.6 8.6 8.7  MG --  --  --  --  2.2 2.1 --   AST 19 --  --  --  --  18 --   ALT 33 --  --  --  --  24 --   ALKPHOS 99 --  --  --  --  78 --   BILITOT 1.8* --  --  --  --  0.8 --   < > = values in this interval not displayed.   ------------------------------------------------------------------------------------------------------------------ estimated creatinine clearance is 67.7 mL/min (by C-G formula based on Cr of 1.51). ------------------------------------------------------------------------------------------------------------------  Recent Labs (last 2 labs)      Recent Labs  11/08/14 0300  HGBA1C 13.6*     ------------------------------------------------------------------------------------------------------------------  Recent Labs (last 2 labs)     No results for input(s): CHOL, HDL, LDLCALC, TRIG, CHOLHDL, LDLDIRECT in the last 72 hours.   ------------------------------------------------------------------------------------------------------------------  Recent Labs (last 2 labs)     No results for input(s): TSH, T4TOTAL, T3FREE, THYROIDAB in the last 72 hours.  Invalid input(s): FREET3   ------------------------------------------------------------------------------------------------------------------  Recent Labs (last 2 labs)     No results for input(s): VITAMINB12, FOLATE, FERRITIN, TIBC, IRON, RETICCTPCT in the last 72 hours.    Coagulation profile  Last Labs     No results for input(s): INR, PROTIME in the last 168 hours.     Recent Labs (last 2 labs)     No results for input(s): DDIMER in the last 72 hours.    Cardiac Enzymes  Last Labs     No results for input(s): CKMB, TROPONINI, MYOGLOBIN in the last 168 hours.  Invalid input(s): CK    ------------------------------------------------------------------------------------------------------------------  Last Labs     Invalid input(s): POCBNP      Cinde Ebert D.O. on 11/10/2014 at 12:52 PM  Between 7am to 7pm - Pager [phone]  After 7pm go to [email or web address] - password [email or web address]  And look for the night coverage person covering for me after hours  [facility] Office [phone]       Revision History     Date/Time User Provider Type Action   11/10/2014 12:58 PM Reyne Dumas, MD Physician Addend   11/10/2014 12:57 PM Reyne Dumas, MD Physician Addend   11/10/2014 12:56 PM Reyne Dumas, MD Physician Sign   View Details Report

## 2014-11-11 NOTE — Progress Notes (Signed)
Pt. Not seen for skilled OT today secondary to being in surgery.  Will check back  for tx. As pt. Medically able to participate.   Thanks,  Romana Juniper, COTA/L

## 2014-11-11 NOTE — Interval H&P Note (Signed)
History and Physical Interval Note:  11/11/2014 8:35 AM  Jay Holloway  has presented today for surgery, with the diagnosis of Biliary Colic  The various methods of treatment have been discussed with the patient and family. After consideration of risks, benefits and other options for treatment, the patient has consented to  Procedure(s): LAPAROSCOPIC CHOLECYSTECTOMY WITH INTRAOPERATIVE CHOLANGIOGRAM (N/A) as a surgical intervention .  The patient's history has been reviewed, patient examined, no change in status, stable for surgery.  I have reviewed the patient's chart and labs.  Questions were answered to the patient's satisfaction.     [name] A.

## 2014-11-11 NOTE — Op Note (Signed)
Laparoscopic Cholecystectomy with IOC Procedure Note  Indications: This patient presents with symptomatic gallbladder disease and will undergo laparoscopic cholecystectomy.The procedure has been discussed with the patient. Operative and non operative treatments have been discussed. Risks of surgery include bleeding, infection,  Common bile duct injury,  Injury to the stomach,liver, colon,small intestine, abdominal wall,  Diaphragm,  Major blood vessels,  And the need for an open procedure.  Other risks include worsening of medical problems, death,  DVT and pulmonary embolism, and cardiovascular events.   Medical options have also been discussed. The patient has been informed of long term expectations of surgery and non surgical options,  The patient agrees to proceed.    Pre-operative Diagnosis: Calculus of gallbladder with acute cholecystitis, without mention of obstruction  Post-operative Diagnosis: Calculus of gallbladder with acute cholecystitis, without mention of obstruction  Surgeon: Shylyn Younce A.   Assistants: OR staff  Anesthesia: General endotracheal anesthesia and Local anesthesia 0.25.% bupivacaine, with epinephrine  ASA Class: 3  Procedure Details  The patient was seen again in the Holding Room. The risks, benefits, complications, treatment options, and expected outcomes were discussed with the patient. The possibilities of reaction to medication, pulmonary aspiration, perforation of viscus, bleeding, recurrent infection, finding a normal gallbladder, the need for additional procedures, failure to diagnose a condition, the possible need to convert to an open procedure, and creating a complication requiring transfusion or operation were discussed with the patient. The patient and/or family concurred with the proposed plan, giving informed consent. The site of surgery properly noted/marked. The patient was taken to Operating Room, identified as [facility] and the procedure verified  as Laparoscopic Cholecystectomy with Intraoperative Cholangiograms. A Time Out was held and the above information confirmed.  Prior to the induction of general anesthesia, antibiotic prophylaxis was administered. General endotracheal anesthesia was then administered and tolerated well. After the induction, the abdomen was prepped in the usual sterile fashion. The patient was positioned in the supine position with the left arm comfortably tucked, along with some reverse Trendelenburg.  Local anesthetic agent was injected into the skin near the umbilicus and an incision made. The midline fascia was incised and the Hasson technique was used to introduce a 12 mm port under direct vision. It was secured with a figure of eight Vicryl suture placed in the usual fashion. Pneumoperitoneum was then created with CO2 and tolerated well without any adverse changes in the patient's vital signs. Additional trocars were introduced under direct vision with an 11 mm trocar in the epigastrium and two  5 mm trocars in the right upper quadrant. All skin incisions were infiltrated with a local anesthetic agent before making the incision and placing the trocars.   The gallbladder was identified, the fundus grasped and retracted cephalad. Adhesions were lysed bluntly and with the electrocautery where indicated, taking care not to injure any adjacent organs or viscus. The infundibulum was grasped and retracted laterally, exposing the peritoneum overlying the triangle of Calot. This was then divided and exposed in a blunt fashion. The cystic duct was clearly identified and bluntly dissected circumferentially. The junctions of the gallbladder, cystic duct and common bile duct were clearly identified prior to the division of any linear structure.   An incision was made in the cystic duct and the cholangiogram catheter introduced. The catheter was secured using an endoclip. The study showed no stones and good visualization of the distal  and proximal biliary tree. The catheter was then removed.   The cystic duct was then  ligated with surgical clips  on the patient side and  clipped on the gallbladder side and divided. The cystic artery was identified, dissected free, ligated with clips and divided as well. Posterior cystic artery clipped and divided.  The gallbladder was dissected from the liver bed in retrograde fashion with the electrocautery. The gallbladder was removed. The liver bed was irrigated and inspected. Hemostasis was achieved with the electrocautery. Copious irrigation was utilized and was repeatedly aspirated until clear all particulate matter. Hemostasis was achieved with no signs  Of bleeding or bile leakage.  Pneumoperitoneum was completely reduced after viewing removal of the trocars under direct vision. The wound was thoroughly irrigated and the fascia was then closed with a figure of eight suture; the skin was then closed with 4 0 monocryl  and a sterile dressing was applied.  Instrument, sponge, and needle counts were correct at closure and at the conclusion of the case.   Findings: Cholecystitis with Cholelithiasis  Estimated Blood Loss: Minimal         Drains: none         Total IV Fluids: 600  mL         Specimens: Gallbladder           Complications: None; patient tolerated the procedure well.         Disposition: PACU - hemodynamically stable.         Condition: stable

## 2014-11-11 NOTE — Anesthesia Preprocedure Evaluation (Signed)
Anesthesia Evaluation  Patient identified by MRN, date of birth, ID band Patient awake    Reviewed: Allergy & Precautions, Patient's Chart, lab work & pertinent test results  Airway Mallampati: II  TM Distance: >3 FB Neck ROM: Full    Dental   Pulmonary former smoker,  breath sounds clear to auscultation        Cardiovascular hypertension, Rhythm:Regular Rate:Normal     Neuro/Psych    GI/Hepatic Neg liver ROS, GI history noted. CE   Endo/Other  diabetes  Renal/GU Renal disease     Musculoskeletal   Abdominal   Peds  Hematology   Anesthesia Other Findings   Reproductive/Obstetrics                             Anesthesia Physical Anesthesia Plan  ASA: III  Anesthesia Plan:    Post-op Pain Management:    Induction:   Airway Management Planned: Oral ETT  Additional Equipment:   Intra-op Plan:   Post-operative Plan: Extubation in OR  Informed Consent: I have reviewed the patients History and Physical, chart, labs and discussed the procedure including the risks, benefits and alternatives for the proposed anesthesia with the patient or authorized representative who has indicated his/her understanding and acceptance.   Dental advisory given  Plan Discussed with: CRNA and Anesthesiologist  Anesthesia Plan Comments:         Anesthesia Quick Evaluation

## 2014-11-12 LAB — GLUCOSE, CAPILLARY
GLUCOSE-CAPILLARY: 267 mg/dL — AB (ref 70–99)
GLUCOSE-CAPILLARY: 224 mg/dL — AB (ref 70–99)
GLUCOSE-CAPILLARY: 196 mg/dL — AB (ref 70–99)
Glucose-Capillary: 261 mg/dL — ABNORMAL HIGH (ref 70–99)

## 2014-11-12 LAB — COMPREHENSIVE METABOLIC PANEL
ALT: 35 U/L (ref 0–53)
ANION GAP: 13 (ref 5–15)
AST: 33 U/L (ref 0–37)
Albumin: 2.7 g/dL — ABNORMAL LOW (ref 3.5–5.2)
Alkaline Phosphatase: 67 U/L (ref 39–117)
BUN: <5 mg/dL — ABNORMAL LOW (ref 6–23)
CALCIUM: 8.3 mg/dL — AB (ref 8.4–10.5)
CO2: 23 mmol/L (ref 19–32)
CREATININE: 1.17 mg/dL (ref 0.50–1.35)
Chloride: 105 mmol/L (ref 96–112)
GFR calc Af Amer: 73 mL/min — ABNORMAL LOW (ref >90)
GFR calc non Af Amer: 63 mL/min — ABNORMAL LOW (ref >90)
Glucose, Bld: 281 mg/dL — ABNORMAL HIGH (ref 70–99)
POTASSIUM: 4 mmol/L (ref 3.5–5.1)
Sodium: 141 mmol/L (ref 135–145)
Total Bilirubin: 0.8 mg/dL (ref 0.3–1.2)
Total Protein: 5.5 g/dL — ABNORMAL LOW (ref 6.0–8.3)

## 2014-11-12 LAB — CULTURE, BLOOD (ROUTINE X 2): Culture: NO GROWTH

## 2014-11-12 LAB — LIPASE, BLOOD: LIPASE: 17 U/L (ref 11–59)

## 2014-11-12 MED ORDER — INSULIN STARTER KIT- SYRINGES (ENGLISH)
1.0000 | Freq: Once | Status: AC
  Administered 2014-11-12: 1
  Filled 2014-11-12: qty 1

## 2014-11-12 MED ORDER — INSULIN GLARGINE 100 UNIT/ML ~~LOC~~ SOLN
30.0000 [IU] | Freq: Every day | SUBCUTANEOUS | Status: DC
Start: 2014-11-12 — End: 2014-11-12
  Filled 2014-11-12: qty 0.3

## 2014-11-12 MED ORDER — INSULIN GLARGINE 100 UNIT/ML ~~LOC~~ SOLN
30.0000 [IU] | Freq: Every day | SUBCUTANEOUS | Status: DC
  Filled 2014-11-12 (×2): qty 0.3

## 2014-11-12 MED ORDER — INSULIN GLARGINE 100 UNIT/ML ~~LOC~~ SOLN
30.0000 [IU] | Freq: Every day | SUBCUTANEOUS | Status: DC
  Administered 2014-11-12: 30 [IU] via SUBCUTANEOUS
  Filled 2014-11-12: qty 0.3

## 2014-11-12 MED ORDER — INSULIN STARTER KIT- PEN NEEDLES (ENGLISH)
1.0000 | Freq: Once | Status: AC
  Administered 2014-11-12: 1
  Filled 2014-11-12: qty 1

## 2014-11-12 NOTE — Progress Notes (Signed)
Expand All Collapse All    [facility]    Patient Demographics  Jay Holloway, is a 69 y.o. male, DOB - [birth year], XVQ:[id number]  Admit date - 11/05/2014 Admitting Physician Chesley Mires, MD  Outpatient Primary MD for the patient is [name], MD  LOS - 5   Chief Complaint  Patient presents with  . Hyperglycemia  . Emesis  . Diabetic Ketoacidosis     Interim history 69 year old with history of type 2 diabetes mellitus, percent with nausea, vomiting, weakness, and found to have DKA. Patient was initially admitted by PCCM, as he was found to be acidotic, have hypotension, with Kussmal respirations. Patient improved and was sent to medical floor. DKA has improved. HAI has improved. Patient also found to have bacteremia, sepsis secondary to urinary tract infection, currently on vancomycin. PT recommended home health therapy. Diabetes coordinator consulted. Hemoglobin A1c 13.6   Assessment & Plan   Sepsis secondary to UTI vs Bacteremia vs acute cholecystitis -Upon admission, patient did have leukocytosis with tachycardia. -Blood cultures from 11/05/2014: Bacillus species (1/2) -Urine culture showed Staphylococcus, coag negative Initially on vancomycin, switched to cefazolin, discussed with infectious disease Dr. Drucilla Schmidt 5 days of cefazolin/vancomycin as adequate treatment for this Bacillus species which could possibly be a contaminant -Patient had a colonoscopy in 2012 and is due for one in Jan 2017 (spoke with Dr. Collene Mares), Dr. Dallas Breeding. Collene Mares attempted to perform colonoscopy  4/13,Solid stool was noted throughout the left colon and rectum. The procedure was aborted  Since bacillus cereus could very well be a contaminant and patient has had adequate therapy , he will be discharged and follow up with Dr Collene Mares outpt    Diabetic ketoacidosis/diabetes mellitus Outpatient Diabetes  medications: Amaryl 2 mg, Victoza 1.8 ml daily, Metformin 1000 mg bid, Onglyza 5 mg daily -Resolved, gap has closed. -Hemoglobin A1c 13.6 Diabetes coordinator . All by mouth medications stopped Continue Lantus and NovoLog FlexPen   Lantus increased to 30 units in the morning   Hypotension -Resolved, likely secondary to above processes  Acute kidney injury -Likely secondary to DKA versus sepsis versus hypotension -Renal ultrasound: No hydronephrosis, nonobstructive left caliculi  Hematuria/history of prostate cancer -Will need to follow up with urologist at discharge -Hemoglobin currently stable  Abdominal pain, secondary to acute cholecystitis Initially thought to be secondary to DKA Given that the patient's colonoscopy had to be aborted, will do a CT scan of the abdomen/pelvis to rule out other possibilities including pancreatitis/mass/obstruction Status post LAPAROSCOPIC CHOLECYSTECTOMY WITH INTRAOPERATIVE CHOLANGIOGRAM (N/A) as a surgical intervention  today Advance to diabetic diet, Continue Zosyn  Leukocytosis vs cholecystitis  -Likely secondary to DKA vs sepsis -Resolved  Code Status: Full Discharge -anticipate that the patient will stay over the weekend, and discharge tomorrow         Time Spent in minutes 35 minutes  Procedures  Renal US  Consults  PCCM Gastroenterology ([facility])  DVT Prophylaxis Heparin   Recent Labs    Lab Results  Component Value Date   PLT 154 11/09/2014      Medications  Scheduled Meds: . antiseptic oral rinse 7 mL Mouth Rinse q12n4p  . ceFAZolin (ANCEF) IV 1 g Intravenous 3 times per day  . chlorhexidine 15 mL Mouth Rinse BID  . heparin 5,000 Units Subcutaneous 3 times per day  . insulin aspart 0-15 Units Subcutaneous TID WC  . insulin aspart 0-5 Units Subcutaneous QHS  . insulin glargine 20 Units Subcutaneous Daily  . polyethylene glycol 17 g Oral BID  Continuous Infusions: . sodium chloride Stopped (11/08/14 0010)  . sodium chloride    PRN Meds:.  Antibiotics   Anti-infectives    Start   Dose/Rate Route Frequency Ordered Stop   11/09/14 1400  ceFAZolin (ANCEF) IVPB 1 g/50 mL premix    1 g 100 mL/hr over 30 Minutes Intravenous 3 times per day 11/09/14 0739    11/09/14 0600  ceFAZolin (ANCEF) IVPB 1 g/50 mL premix Status: Discontinued    1 g 100 mL/hr over 30 Minutes Intravenous 4 times per day 11/08/14 1526 11/09/14 0739   11/08/14 1300  vancomycin (VANCOCIN) 1,500 mg in sodium chloride 0.9 % 500 mL IVPB Status: Discontinued    1,500 mg 250 mL/hr over 120 Minutes Intravenous Every 24 hours 11/07/14 1142 11/08/14 1526   11/07/14 1145  vancomycin (VANCOCIN) 2,000 mg in sodium chloride 0.9 % 500 mL IVPB    2,000 mg 250 mL/hr over 120 Minutes Intravenous Once 11/07/14 1142 11/07/14 1520   11/05/14 2100  cefTRIAXone (ROCEPHIN) 1 g in dextrose 5 % 50 mL IVPB - Premix Status: Discontinued    1 g 100 mL/hr over 30 Minutes Intravenous Every 24 hours 11/05/14 1952 11/07/14 1003        Subjective:   Patient complaining also right upper quadrant pain, ready to try clear liquid diet today  Objective:   Filed Vitals:   11/09/14 1600 11/09/14 1629 11/09/14 2058 11/10/14 0610  BP: 136/87 129/85 111/65 113/76  Pulse: 100 93 96 97  Temp:  97.7 F (36.5 C) 98.1 F (36.7 C) 98.8 F (37.1 C)  TempSrc:  Oral Oral Oral  Resp: 23 20 17 19   Height:      Weight:      SpO2: 96% 97% 97% 98%    Wt Readings from Last 3 Encounters:  11/09/14 121.8 kg (268 lb 8.3 oz)  04/18/14 139.056 kg (306 lb 9 oz)  06/04/12 146.512 kg (323 lb)     Intake/Output Summary (Last 24 hours) at 11/10/14 1252 Last data filed at 11/10/14 1243  Gross per 24 hour  Intake  480 ml  Output  550 ml   Net  -70 ml    Exam  General: Well developed, well nourished, NAD, appears stated age  32: NCAT,mucous membranes moist.   Cardiovascular: S1 S2 auscultated, no rubs, murmurs or gallops. Regular rate and rhythm.  Respiratory: Clear to auscultation bilaterally with equal chest rise  Abdomen: Soft, diffuse abdominal pain, nondistended, + bowel sounds  Extremities: warm dry without cyanosis clubbing or edema  Neuro: AAOx3, nonfocal  Psych: Normal affect and demeanor  Data Review   Micro Results Recent Results (from the past 240 hour(s))  Blood culture (routine x 2) Status: None   Collection Time: 11/05/14 4:54 PM  Result Value Ref Range Status   Specimen Description BLOOD LEFT HAND  Final   Special Requests BOTTLES DRAWN AEROBIC AND ANAEROBIC 4CC  Final   Culture   Final    BACILLUS SPECIES Note: Standardized susceptibility testing for this organism is not available. Note: Gram Stain Report Called to,Read Back By and Verified With: JESSIE CLEAVER ON 4.10.16 @ 0211 BY [place] Performed at [facility]    Report Status 11/07/2014 FINAL  Final  Urine culture Status: None   Collection Time: 11/05/14 5:12 PM  Result Value Ref Range Status   Specimen Description URINE, RANDOM  Final   Special Requests ADDED 2354  Final   Colony Count   Final    40,000 COLONIES/ML  Performed at [facility]   Final    STAPHYLOCOCCUS SPECIES (COAGULASE NEGATIVE) Note: RIFAMPIN AND GENTAMICIN SHOULD NOT BE USED AS SINGLE DRUGS FOR TREATMENT OF STAPH INFECTIONS. Performed at [facility]    Report Status 11/08/2014 FINAL  Final   Organism ID, Bacteria STAPHYLOCOCCUS SPECIES (COAGULASE NEGATIVE)  Final   Susceptibility   Staphylococcus species (coagulase negative) - MIC*    GENTAMICIN <=0.5 SENSITIVE Sensitive     LEVOFLOXACIN >=8 RESISTANT Resistant      NITROFURANTOIN <=16 SENSITIVE Sensitive     OXACILLIN <=0.25 SENSITIVE Sensitive     PENICILLIN >=0.5 RESISTANT Resistant     RIFAMPIN <=0.5 SENSITIVE Sensitive     TRIMETH/SULFA <=10 SENSITIVE Sensitive     VANCOMYCIN 1 SENSITIVE Sensitive     TETRACYCLINE 2 SENSITIVE Sensitive    * STAPHYLOCOCCUS SPECIES (COAGULASE NEGATIVE)  MRSA PCR Screening Status: None   Collection Time: 11/05/14 8:00 PM  Result Value Ref Range Status   MRSA by PCR NEGATIVE NEGATIVE Final    Comment:   The GeneXpert MRSA Assay (FDA approved for NASAL specimens only), is one component of a comprehensive MRSA colonization surveillance program. It is not intended to diagnose MRSA infection nor to guide or monitor treatment for MRSA infections.   Blood culture (routine x 2) Status: None (Preliminary result)   Collection Time: 11/05/14 8:34 PM  Result Value Ref Range Status   Specimen Description BLOOD RIGHT HAND  Final   Special Requests BOTTLES DRAWN AEROBIC AND ANAEROBIC 1 CC  Final   Culture   Final     BLOOD CULTURE RECEIVED NO GROWTH TO DATE CULTURE WILL BE HELD FOR 5 DAYS BEFORE ISSUING A FINAL NEGATIVE REPORT Performed at [facility]    Report Status PENDING  Incomplete    Radiology Reports  Imaging Results    US Renal  11/06/2014 CLINICAL DATA: 69 year old male with renal failure. History of hypertension, diabetes and prostate cancer. EXAM: RENAL/URINARY TRACT ULTRASOUND COMPLETE COMPARISON: None. FINDINGS: Right Kidney: Length: 13 cm. A 6 x 7.6 cm cyst extending off of the lower pole is identified. Echogenicity within normal limits. No solid mass or hydronephrosis visualized. Left Kidney: Length: 13.7 cm. Echogenicity within normal limits. A 2.6 x 2.2 cm mid renal cyst is present. A 1 cm echogenic area within the lower pole and 1.5 cm echogenic area within the mid  left kidney are noted and could represent nonobstructing calculi. Bladder: Appears normal for degree of bladder distention. IMPRESSION: Bilateral renal cysts and possible nonobstructing left renal calculi. Normal appearing kidneys and bladder otherwise. No evidence of hydronephrosis. Electronically Signed By: Margarette Canada M.D.  On: 11/06/2014 11:57   Dg Chest Portable 1 View  11/05/2014 CLINICAL DATA: Hyperglycemia and emesis. History of diabetes and hypertension. EXAM: PORTABLE CHEST - 1 VIEW COMPARISON: 05/29/2012 and prior chest radiographs dating back to 07/08/2003 FINDINGS: The cardiomediastinal silhouette is unchanged. Elevation of the right hemidiaphragm and right basilar scarring again noted. There is no evidence of focal airspace disease, pulmonary edema, suspicious pulmonary nodule/mass, pleural effusion, or pneumothorax. No acute bony abnormalities are identified. IMPRESSION: No active disease. Electronically Signed By: Margarette Canada M.D. On: 11/05/2014 17:23     CBC  Last Labs      Recent Labs Lab 11/05/14 1517 11/06/14 0411 11/07/14 0300 11/09/14 0404  WBC 13.4* 11.5* 9.4 6.2  HGB 14.2 13.8 13.1 12.9*  HCT 43.7 41.7 39.9 40.6  PLT 207 177 176 154  MCV 84.9 83.1  82.6 83.7  MCH 27.6 27.5 27.1 26.6  MCHC 32.5 33.1 32.8 31.8  RDW 14.5 14.7 14.9 14.9  LYMPHSABS 1.7 --  --  --   MONOABS 1.1* --  --  --   EOSABS 0.0 --  --  --   BASOSABS 0.0 --  --  --       Chemistries   Last Labs      Recent Labs Lab 11/05/14 1517  11/06/14 0830 11/06/14 1302 11/06/14 1642 11/07/14 0300 11/09/14 0404  NA 135 < > 143 143 143 142 141  K 5.4* < > 5.0 4.2 4.0 4.0 4.4  CL 96 < > 111 110 110 105 107  CO2 9* < > 18* 20 24 29  18*  GLUCOSE 427* < > 151* 221* 167* 183* 167*  BUN 54* < > 35* 27* 22 14 10   CREATININE 3.33* < >  1.84* 1.85* 1.61* 1.38* 1.51*  CALCIUM 9.0 < > 8.6 8.6 8.6 8.6 8.7  MG --  --  --  --  2.2 2.1 --   AST 19 --  --  --  --  18 --   ALT 33 --  --  --  --  24 --   ALKPHOS 99 --  --  --  --  78 --   BILITOT 1.8* --  --  --  --  0.8 --   < > = values in this interval not displayed.   ------------------------------------------------------------------------------------------------------------------ estimated creatinine clearance is 67.7 mL/min (by C-G formula based on Cr of 1.51). ------------------------------------------------------------------------------------------------------------------  Recent Labs (last 2 labs)      Recent Labs  11/08/14 0300  HGBA1C 13.6*     ------------------------------------------------------------------------------------------------------------------  Recent Labs (last 2 labs)     No results for input(s): CHOL, HDL, LDLCALC, TRIG, CHOLHDL, LDLDIRECT in the last 72 hours.   ------------------------------------------------------------------------------------------------------------------  Recent Labs (last 2 labs)     No results for input(s): TSH, T4TOTAL, T3FREE, THYROIDAB in the last 72 hours.  Invalid input(s): FREET3   ------------------------------------------------------------------------------------------------------------------  Recent Labs (last 2 labs)     No results for input(s): VITAMINB12, FOLATE, FERRITIN, TIBC, IRON, RETICCTPCT in the last 72 hours.    Coagulation profile  Last Labs     No results for input(s): INR, PROTIME in the last 168 hours.     Recent Labs (last 2 labs)     No results for input(s): DDIMER in the last 72 hours.    Cardiac Enzymes  Last Labs     No results for input(s): CKMB, TROPONINI, MYOGLOBIN in the last 168 hours.  Invalid input(s): CK    ------------------------------------------------------------------------------------------------------------------  Last Labs     Invalid input(s): POCBNP      Safiyah Cisney D.O. on 11/10/2014 at 12:52 PM  Between 7am to 7pm - Pager [phone]  After 7pm go to [email or web address] - password [email or web address]  And look for the night coverage person covering for me after hours  [facility] Office [phone]       Revision History     Date/Time User Provider Type Action   11/10/2014 12:58 PM Reyne Dumas, MD Physician Addend   11/10/2014 12:57 PM Reyne Dumas, MD Physician Addend   11/10/2014 12:56 PM Reyne Dumas, MD Physician Sign   View Details Report

## 2014-11-12 NOTE — Progress Notes (Signed)
Physical Therapy Treatment Patient Details Name: Jay Holloway MRN: [id number] DOB: [birth year] Today's Date: 2014/11/19    History of Present Illness Patient is a 69 y/o male with PMH of type II diabetes, HTN and prostate ca presenting with nausea/vomiting and weakness. Labs consistent with DKA. Admitted with DKA, AMS and AKI secondary to bacteremia. Renal US- B renal cysts and  nonobstructing left renal caliculi.   Underwent laproscopic cholecystectomy on 11/11/14.     PT Comments    Patient modified independent for all mobility, without assistive device.  All goals met and no further PT needs identified.    Follow Up Recommendations  No PT follow up     Equipment Recommendations  None recommended by PT    Recommendations for Other Services       Precautions / Restrictions Precautions Precautions: Fall Restrictions Weight Bearing Restrictions: No    Mobility  Bed Mobility Overal bed mobility: Independent                Transfers Overall transfer level: Independent Equipment used: None Transfers: Sit to/from Stand Sit to Stand: Independent            Ambulation/Gait Ambulation/Gait assistance: Modified independent (Device/Increase time) Ambulation Distance (Feet): 150 Feet Assistive device: None Gait Pattern/deviations: WFL(Within Functional Limits);Trunk flexed     General Gait Details: started out using RW, but able to progress to no assistive device with no apparent balance deficits.     Stairs            Wheelchair Mobility    Modified Rankin (Stroke Patients Only)       Balance Overall balance assessment: No apparent balance deficits (not formally assessed)                                  Cognition Arousal/Alertness: Awake/alert Behavior During Therapy: WFL for tasks assessed/performed Overall Cognitive Status: Within Functional Limits for tasks assessed                      Exercises      General  Comments        Pertinent Vitals/Pain Pain Score: 5  Pain Location: abdomen  Pain Descriptors / Indicators: Aching Pain Intervention(s): Limited activity within patient's tolerance;Monitored during session    Home Living                      Prior Function            PT Goals (current goals can now be found in the care plan section) Progress towards PT goals: Goals met/education completed, patient discharged from PT    Frequency       PT Plan  (patient met all goals, will d/c from PT)    Co-evaluation             End of Session   Activity Tolerance: Patient tolerated treatment well Patient left: in bed;with call bell/phone within reach     Time: 1512-1521 PT Time Calculation (min) (ACUTE ONLY): 9 min  Charges:  $Gait Training: 8-22 mins                    G Codes:      Shanna Cisco 19-Nov-2014, 3:24 PM  2014-11-19 [place], [place]

## 2014-11-12 NOTE — Progress Notes (Signed)
1 Day Post-Op  Subjective: Complains of soreness RUQ. No nausea  Objective: Vital signs in last 24 hours: Temp:  [97.6 F (36.4 C)-98.4 F (36.9 C)] 98.4 F (36.9 C) (04/16 0507) Pulse Rate:  [72-109] 104 (04/16 0507) Resp:  [16-20] 20 (04/16 0507) BP: (112-153)/(59-81) 115/66 mmHg (04/16 0507) SpO2:  [98 %-100 %] 100 % (04/16 0507) Weight:  [123.696 kg (272 lb 11.2 oz)] 123.696 kg (272 lb 11.2 oz) (04/16 0507) Last BM Date: 11/10/14  Intake/Output from previous day: 04/15 0701 - 04/16 0700 In: 2794.2 [P.O.:460; I.V.:2284.2; IV Piggyback:50] Out: 035 [Urine:770] Intake/Output this shift: Total I/O In: 1294.2 [P.O.:460; I.V.:784.2; IV Piggyback:50] Out: 650 [Urine:650]  Resp: clear to auscultation bilaterally Cardio: regular rate and rhythm GI: soft, mild tenderness RUQ. incisions look good. good bs  Lab Results:   Recent Labs  11/11/14 0506  WBC 5.9  HGB 15.3  HCT 46.3  PLT 146*   BMET  Recent Labs  11/11/14 0506 11/12/14 0537  NA 139 141  K 4.2 4.0  CL 104 105  CO2 16* 23  GLUCOSE 256* 281*  BUN <5* <5*  CREATININE 1.36* 1.17  CALCIUM 8.7 8.3*   PT/INR No results for input(s): LABPROT, INR in the last 72 hours. ABG No results for input(s): PHART, HCO3 in the last 72 hours.  Invalid input(s): PCO2, PO2  Studies/Results: Ct Abdomen Pelvis Wo Contrast  11/10/2014   CLINICAL DATA:  Acute onset abdominal pain and nausea. Diabetic ketoacidosis.  EXAM: CT ABDOMEN AND PELVIS WITHOUT CONTRAST  TECHNIQUE: Multidetector CT imaging of the abdomen and pelvis was performed following the standard protocol without IV contrast.  COMPARISON:  CT abdomen and pelvis 10/08/2014.  FINDINGS: There is some mild stress of mild dependent atelectasis is seen the right lung base. Elevation of the right hemidiaphragm is noted. The lung bases are otherwise clear. No pleural or pericardial effusion. Heart size is normal. Calcific coronary artery disease is noted.  Four  nonobstructing left renal stones are identified. The largest is in the midpole measuring 0.4 cm. Two small nonobstructing right renal stones are also identified. Bilateral renal cysts are unchanged. Small stones are seen layering dependently within the gallbladder. Stranding of fat is seen interposed between the inferior margin of the gallbladder and along the lateral margin of the descending duodenum. The spleen, adrenal glands and pancreas appear normal.  The stomach, colon and appendix appear normal. No lymphadenopathy or fluid collection is identified. The patient is status post prostatectomy.  Flowing syndesmophytes throughout the imaged spine and fusion of the posterior elements is most suggestive of ankylosing spondylitis. No lytic or sclerotic bony lesion is identified.  IMPRESSION: Stranding interposed between the inferior margin of the gallbladder and descending duodenum could be secondary to acute cholecystitis in this patient with small gallstones. Duodenitis is also possible. No focal fluid collection or bowel perforation is seen. Right upper quadrant ultrasound could be used for further evaluation.  Bilateral nonobstructing renal stones.  Calcific aortic and coronary atherosclerosis.  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   Electronically Signed   By: Inge Rise M.D.   On: 11/10/2014 13:30   Dg Cholangiogram Operative  11/11/2014   CLINICAL DATA:  Cholelithiasis  EXAM: INTRAOPERATIVE CHOLANGIOGRAM  TECHNIQUE: Cholangiographic images from the C-arm fluoroscopic device were submitted for interpretation post-operatively. Please see the procedural report for the amount of contrast and the fluoroscopy time utilized.  COMPARISON:  None.  FINDINGS: Contrast fills the biliary tree and duodenum without filling  defects in the common bile duct.  IMPRESSION: Patent biliary tree without evidence of common bile duct stones.   Electronically Signed   By: Marybelle Killings M.D.    On: 11/11/2014 12:52   US Abdomen Limited Ruq  11/10/2014   CLINICAL DATA:  Biliary colic. History of hypertension, diabetes and prostate cancer.  EXAM: US ABDOMEN LIMITED - RIGHT UPPER QUADRANT  COMPARISON:  Abdominal CT same date.  Renal ultrasound 11/06/2014.  FINDINGS: Gallbladder:  No gallstones or wall thickening visualized. No sonographic Murphy sign noted.  Common bile duct:  Diameter: 3.1 mm.  No evidence of choledocholithiasis.  Liver:  Hepatic evaluation is limited by body habitus and bowel gas. The liver appears mildly echogenic. No focal abnormality identified.  IMPRESSION: No acute right upper quadrant abdominal findings. Mildly increased hepatic echogenicity. Examination is mildly limited by body habitus and bowel gas.   Electronically Signed   By: Richardean Sale M.D.   On: 11/10/2014 21:41    Anti-infectives: Anti-infectives    Start     Dose/Rate Route Frequency Ordered Stop   11/10/14 1430  piperacillin-tazobactam (ZOSYN) IVPB 3.375 g     3.375 g 12.5 mL/hr over 240 Minutes Intravenous 3 times per day 11/10/14 1350     11/09/14 1400  ceFAZolin (ANCEF) IVPB 1 g/50 mL premix  Status:  Discontinued     1 g 100 mL/hr over 30 Minutes Intravenous 3 times per day 11/09/14 0739 11/10/14 1258   11/09/14 0600  ceFAZolin (ANCEF) IVPB 1 g/50 mL premix  Status:  Discontinued     1 g 100 mL/hr over 30 Minutes Intravenous 4 times per day 11/08/14 1526 11/09/14 0739   11/08/14 1300  vancomycin (VANCOCIN) 1,500 mg in sodium chloride 0.9 % 500 mL IVPB  Status:  Discontinued     1,500 mg 250 mL/hr over 120 Minutes Intravenous Every 24 hours 11/07/14 1142 11/08/14 1526   11/07/14 1145  vancomycin (VANCOCIN) 2,000 mg in sodium chloride 0.9 % 500 mL IVPB     2,000 mg 250 mL/hr over 120 Minutes Intravenous  Once 11/07/14 1142 11/07/14 1520   11/05/14 2100  cefTRIAXone (ROCEPHIN) 1 g in dextrose 5 % 50 mL IVPB - Premix  Status:  Discontinued     1 g 100 mL/hr over 30 Minutes Intravenous Every 24  hours 11/05/14 1952 11/07/14 1003      Assessment/Plan: s/p Procedure(s): LAPAROSCOPIC CHOLECYSTECTOMY WITH INTRAOPERATIVE CHOLANGIOGRAM (N/A) Advance diet  Stable from surgical standpoint. Ok for discharge once he can tolerate diet  LOS: 7 days    TOTH III,PAUL S 11/12/2014

## 2014-11-13 LAB — GLUCOSE, CAPILLARY
Glucose-Capillary: 181 mg/dL — ABNORMAL HIGH (ref 70–99)
Glucose-Capillary: 186 mg/dL — ABNORMAL HIGH (ref 70–99)

## 2014-11-13 MED ORDER — HYDROCODONE-ACETAMINOPHEN 5-325 MG PO TABS: 1.0000 | ORAL_TABLET | Freq: Four times a day (QID) | ORAL | Status: DC | PRN

## 2014-11-13 MED ORDER — INSULIN GLARGINE 100 UNIT/ML ~~LOC~~ SOLN
40.0000 [IU] | Freq: Every day | SUBCUTANEOUS | Status: DC
Start: 2014-11-13 — End: 2014-11-13
  Administered 2014-11-13: 40 [IU] via SUBCUTANEOUS
  Filled 2014-11-13: qty 0.4

## 2014-11-13 MED ORDER — INSULIN DETEMIR 100 UNIT/ML FLEXPEN: 35.0000 [IU] | PEN_INJECTOR | Freq: Every day | SUBCUTANEOUS | Status: DC

## 2014-11-13 NOTE — Progress Notes (Signed)
Patient returned to floor at 7pm stating that his pharmacy was going to charge him $900 for his insulin flex-pens.  Notified T. Rogue Bussing regarding situation. He called provider line for patient's insurance company to try to have prescriptions re-formulated.  Insurance company was closed.  He then referred me to K. Schorr on site at [facility] to prescribe patient with vials of insulin instead of flex-pens due to cost.  [place] who will write new prescriptions for patient and call them into pharmacy so he will have medication tonight.  Patient was taught how to draw up & administer insulin from vial. Patient able to return demonstrate. Patient currently waiting for new prescriptions and was instructed to follow up with insurance company and primary care provider tomorrow.

## 2014-11-13 NOTE — Discharge Instructions (Signed)

## 2014-11-13 NOTE — Progress Notes (Signed)
Jay Holloway to be D/C'd Home per MD order.  Discussed with the patient and all questions fully answered.  VSS, Surgical incision sites clean, dry, intact with no sign of infection.   IV catheter discontinued intact. Site without signs and symptoms of complications. Dressing and pressure applied.  An After Visit Summary was printed and given to the patient. Patient received prescription.   D/c education completed with patient/family including follow up instructions, medication list, d/c activities limitations if indicated, with other d/c instructions as indicated by MD - patient able to verbalize understanding, all questions fully answered.   Patient instructed to return to ED, call 911, or call MD for any changes in condition.   Patient escorted via [place], and D/C home via private auto.  Kathrine Cords D 11/13/2014 11:30 AM

## 2014-11-13 NOTE — Discharge Summary (Signed)
Physician Discharge Summary  Jay Holloway MRN: [id number] DOB/AGE: 08-18-1945 69 y.o.  PCP: Michiel Sites, MD   Admit date: 11/05/2014 Discharge date: 11/13/2014  Discharge Diagnoses:   Active Problems:   DKA (diabetic ketoacidoses)   Arterial hypotension   Renal failure   Type 2 diabetes mellitus with ketoacidosis without coma   Weakness   Bacteremia   Blood poisoning   Urinary tract infectious disease   Sepsis  acute cholecystitis, status post laparoscopic  cholecystectomy  Follow-up recommendations Follow-up with PCP in 3-5 days Follow-up CBC, CMP, magnesium in one week Follow-up with general surgery as scheduled Follow-up with gastroenterology for outpatient elective colonoscopy     Medication List    STOP taking these medications        ciprofloxacin 500 MG tablet  Commonly known as:  CIPRO     glimepiride 2 MG tablet  Commonly known as:  AMARYL     indomethacin 75 MG CR capsule  Commonly known as:  INDOCIN SR     metFORMIN 500 MG tablet  Commonly known as:  GLUCOPHAGE     naproxen 500 MG tablet  Commonly known as:  NAPROSYN     ONGLYZA 5 MG Tabs tablet  Generic drug:  saxagliptin HCl     VICTOZA 18 MG/3ML Soln injection  Generic drug:  Liraglutide      TAKE these medications        amLODipine 10 MG tablet  Commonly known as:  NORVASC  Take 10 mg by mouth daily.     BD PEN NEEDLE NANO U/F 32G X 4 MM Misc  Generic drug:  Insulin Pen Needle     HYDROcodone-acetaminophen 5-325 MG per tablet  Commonly known as:  NORCO  Take 1-2 tablets by mouth every 6 (six) hours as needed.     insulin aspart 100 UNIT/ML FlexPen  Commonly known as:  NOVOLOG FLEXPEN  Inject 6 Units into the skin 3 (three) times daily with meals.     Insulin Detemir 100 UNIT/ML Pen  Commonly known as:  LEVEMIR FLEXPEN  Inject 35 Units into the skin daily at 10 pm.     irbesartan 300 MG tablet  Commonly known as:  AVAPRO  Take 300 mg by mouth daily.      rosuvastatin 10 MG tablet  Commonly known as:  CRESTOR  Take 10 mg by mouth every morning.     traMADol 50 MG tablet  Commonly known as:  ULTRAM  Take 50 mg by mouth every 6 (six) hours as needed for moderate pain.        Discharge Condition: Stable   Disposition: 01-Home or Self Care   Consults: General surgery Gastroenterology    Significant Diagnostic Studies: Ct Abdomen Pelvis Wo Contrast  11/10/2014   CLINICAL DATA:  Acute onset abdominal pain and nausea. Diabetic ketoacidosis.  EXAM: CT ABDOMEN AND PELVIS WITHOUT CONTRAST  TECHNIQUE: Multidetector CT imaging of the abdomen and pelvis was performed following the standard protocol without IV contrast.  COMPARISON:  CT abdomen and pelvis 10/08/2014.  FINDINGS: There is some mild stress of mild dependent atelectasis is seen the right lung base. Elevation of the right hemidiaphragm is noted. The lung bases are otherwise clear. No pleural or pericardial effusion. Heart size is normal. Calcific coronary artery disease is noted.  Four nonobstructing left renal stones are identified. The largest is in the midpole measuring 0.4 cm. Two small nonobstructing right renal stones are also identified. Bilateral renal cysts are unchanged. Small  stones are seen layering dependently within the gallbladder. Stranding of fat is seen interposed between the inferior margin of the gallbladder and along the lateral margin of the descending duodenum. The spleen, adrenal glands and pancreas appear normal.  The stomach, colon and appendix appear normal. No lymphadenopathy or fluid collection is identified. The patient is status post prostatectomy.  Flowing syndesmophytes throughout the imaged spine and fusion of the posterior elements is most suggestive of ankylosing spondylitis. No lytic or sclerotic bony lesion is identified.  IMPRESSION: Stranding interposed between the inferior margin of the gallbladder and descending duodenum could be secondary to acute  cholecystitis in this patient with small gallstones. Duodenitis is also possible. No focal fluid collection or bowel perforation is seen. Right upper quadrant ultrasound could be used for further evaluation.  Bilateral nonobstructing renal stones.  Calcific aortic and coronary atherosclerosis.  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   Electronically Signed   By: Inge Rise M.D.   On: 11/10/2014 13:30   Dg Cholangiogram Operative  11/11/2014   CLINICAL DATA:  Cholelithiasis  EXAM: INTRAOPERATIVE CHOLANGIOGRAM  TECHNIQUE: Cholangiographic images from the C-arm fluoroscopic device were submitted for interpretation post-operatively. Please see the procedural report for the amount of contrast and the fluoroscopy time utilized.  COMPARISON:  None.  FINDINGS: Contrast fills the biliary tree and duodenum without filling defects in the common bile duct.  IMPRESSION: Patent biliary tree without evidence of common bile duct stones.   Electronically Signed   By: Marybelle Killings M.D.   On: 11/11/2014 12:52   US Renal  11/06/2014   CLINICAL DATA:  69 year old male with renal failure. History of hypertension, diabetes and prostate cancer.  EXAM: RENAL/URINARY TRACT ULTRASOUND COMPLETE  COMPARISON:  None.  FINDINGS: Right Kidney:  Length: 13 cm. A 6 x 7.6 cm cyst extending off of the lower pole is identified. Echogenicity within normal limits. No solid mass or hydronephrosis visualized.  Left Kidney:  Length: 13.7 cm. Echogenicity within normal limits. A 2.6 x 2.2 cm mid renal cyst is present. A 1 cm echogenic area within the lower pole and 1.5 cm echogenic area within the mid left kidney are noted and could represent nonobstructing calculi.  Bladder:  Appears normal for degree of bladder distention.  IMPRESSION: Bilateral renal cysts and possible nonobstructing left renal calculi.  Normal appearing kidneys and bladder otherwise. No evidence of hydronephrosis.   Electronically Signed   By:  Margarette Canada M.D.   On: 11/06/2014 11:57   Dg Chest Portable 1 View  11/05/2014   CLINICAL DATA:  Hyperglycemia and emesis. History of diabetes and hypertension.  EXAM: PORTABLE CHEST - 1 VIEW  COMPARISON:  05/29/2012 and prior chest radiographs dating back to 07/08/2003  FINDINGS: The cardiomediastinal silhouette is unchanged.  Elevation of the right hemidiaphragm and right basilar scarring again noted.  There is no evidence of focal airspace disease, pulmonary edema, suspicious pulmonary nodule/mass, pleural effusion, or pneumothorax. No acute bony abnormalities are identified.  IMPRESSION: No active disease.   Electronically Signed   By: Margarette Canada M.D.   On: 11/05/2014 17:23   US Abdomen Limited Ruq  11/10/2014   CLINICAL DATA:  Biliary colic. History of hypertension, diabetes and prostate cancer.  EXAM: US ABDOMEN LIMITED - RIGHT UPPER QUADRANT  COMPARISON:  Abdominal CT same date.  Renal ultrasound 11/06/2014.  FINDINGS: Gallbladder:  No gallstones or wall thickening visualized. No sonographic Murphy sign noted.  Common bile duct:  Diameter: 3.1 mm.  No evidence of choledocholithiasis.  Liver:  Hepatic evaluation is limited by body habitus and bowel gas. The liver appears mildly echogenic. No focal abnormality identified.  IMPRESSION: No acute right upper quadrant abdominal findings. Mildly increased hepatic echogenicity. Examination is mildly limited by body habitus and bowel gas.   Electronically Signed   By: Carey Bullocks M.D.   On: 11/10/2014 21:41     Microbiology: Recent Results (from the past 240 hour(s))  Blood culture (routine x 2)     Status: None   Collection Time: 11/05/14  4:54 PM  Result Value Ref Range Status   Specimen Description BLOOD LEFT HAND  Final   Special Requests BOTTLES DRAWN AEROBIC AND ANAEROBIC 4CC  Final   Culture   Final    BACILLUS SPECIES Note: Standardized susceptibility testing for this organism is not available. Note: Gram Stain Report Called to,Read  Back By and Verified With: JESSIE CLEAVER ON 4.10.16 @ 1555 BY FERGK Performed at [facility]    Report Status 11/07/2014 FINAL  Final  Urine culture     Status: None   Collection Time: 11/05/14  5:12 PM  Result Value Ref Range Status   Specimen Description URINE, RANDOM  Final   Special Requests ADDED 2354  Final   Colony Count   Final    40,000 COLONIES/ML Performed at [facility]    Culture   Final    STAPHYLOCOCCUS SPECIES (COAGULASE NEGATIVE) Note: RIFAMPIN AND GENTAMICIN SHOULD NOT BE USED AS SINGLE DRUGS FOR TREATMENT OF STAPH INFECTIONS. Performed at [facility]    Report Status 11/08/2014 FINAL  Final   Organism ID, Bacteria STAPHYLOCOCCUS SPECIES (COAGULASE NEGATIVE)  Final      Susceptibility   Staphylococcus species (coagulase negative) - MIC*    GENTAMICIN <=0.5 SENSITIVE Sensitive     LEVOFLOXACIN >=8 RESISTANT Resistant     NITROFURANTOIN <=16 SENSITIVE Sensitive     OXACILLIN <=0.25 SENSITIVE Sensitive     PENICILLIN >=0.5 RESISTANT Resistant     RIFAMPIN <=0.5 SENSITIVE Sensitive     TRIMETH/SULFA <=10 SENSITIVE Sensitive     VANCOMYCIN 1 SENSITIVE Sensitive     TETRACYCLINE 2 SENSITIVE Sensitive     * STAPHYLOCOCCUS SPECIES (COAGULASE NEGATIVE)  MRSA PCR Screening     Status: None   Collection Time: 11/05/14  8:00 PM  Result Value Ref Range Status   MRSA by PCR NEGATIVE NEGATIVE Final    Comment:        The GeneXpert MRSA Assay (FDA approved for NASAL specimens only), is one component of a comprehensive MRSA colonization surveillance program. It is not intended to diagnose MRSA infection nor to guide or monitor treatment for MRSA infections.   Blood culture (routine x 2)     Status: None   Collection Time: 11/05/14  8:34 PM  Result Value Ref Range Status   Specimen Description BLOOD RIGHT HAND  Final   Special Requests BOTTLES DRAWN AEROBIC AND ANAEROBIC 1 CC  Final   Culture   Final    NO GROWTH 5 DAYS Performed at  [facility]    Report Status 11/12/2014 FINAL  Final     Labs: Results for orders placed or performed during the hospital encounter of 11/05/14 (from the past 48 hour(s))  Glucose, capillary     Status: Abnormal   Collection Time: 11/11/14 11:40 AM  Result Value Ref Range   Glucose-Capillary 233 (H) 70 - 99 mg/dL   Comment 1 Notify RN  Comment 2 Document in Chart   Glucose, capillary     Status: Abnormal   Collection Time: 11/11/14  4:52 PM  Result Value Ref Range   Glucose-Capillary 385 (H) 70 - 99 mg/dL  Glucose, capillary     Status: Abnormal   Collection Time: 11/11/14  9:23 PM  Result Value Ref Range   Glucose-Capillary 256 (H) 70 - 99 mg/dL  Comprehensive metabolic panel     Status: Abnormal   Collection Time: 11/12/14  5:37 AM  Result Value Ref Range   Sodium 141 135 - 145 mmol/L   Potassium 4.0 3.5 - 5.1 mmol/L   Chloride 105 96 - 112 mmol/L   CO2 23 19 - 32 mmol/L   Glucose, Bld 281 (H) 70 - 99 mg/dL   BUN <5 (L) 6 - 23 mg/dL   Creatinine, Ser 1.17 0.50 - 1.35 mg/dL   Calcium 8.3 (L) 8.4 - 10.5 mg/dL   Total Protein 5.5 (L) 6.0 - 8.3 g/dL   Albumin 2.7 (L) 3.5 - 5.2 g/dL   AST 33 0 - 37 U/L   ALT 35 0 - 53 U/L   Alkaline Phosphatase 67 39 - 117 U/L   Total Bilirubin 0.8 0.3 - 1.2 mg/dL   GFR calc non Af Amer 63 (L) >90 mL/min   GFR calc Af Amer 73 (L) >90 mL/min    Comment: (NOTE) The eGFR has been calculated using the CKD EPI equation. This calculation has not been validated in all clinical situations. eGFR's persistently <90 mL/min signify possible Chronic Kidney Disease.    Anion gap 13 5 - 15  Lipase, blood     Status: None   Collection Time: 11/12/14  5:37 AM  Result Value Ref Range   Lipase 17 11 - 59 U/L  Glucose, capillary     Status: Abnormal   Collection Time: 11/12/14  7:49 AM  Result Value Ref Range   Glucose-Capillary 261 (H) 70 - 99 mg/dL  Glucose, capillary     Status: Abnormal   Collection Time: 11/12/14 12:54 PM  Result  Value Ref Range   Glucose-Capillary 267 (H) 70 - 99 mg/dL  Glucose, capillary     Status: Abnormal   Collection Time: 11/12/14  4:59 PM  Result Value Ref Range   Glucose-Capillary 224 (H) 70 - 99 mg/dL  Glucose, capillary     Status: Abnormal   Collection Time: 11/12/14  9:06 PM  Result Value Ref Range   Glucose-Capillary 196 (H) 70 - 99 mg/dL  Glucose, capillary     Status: Abnormal   Collection Time: 11/13/14  7:38 AM  Result Value Ref Range   Glucose-Capillary 181 (H) 70 - 99 mg/dL     Interim history 69 year old with history of type 2 diabetes mellitus, percent with nausea, vomiting, weakness, and found to have DKA. Patient was initially admitted by PCCM, as he was found to be acidotic, have hypotension, with Kussmal respirations. Patient improved and was sent to medical floor. DKA has improved. HAI has improved. Patient also found to have bacteremia, sepsis secondary to urinary tract infection, currently on vancomycin. PT recommended home health therapy. Diabetes coordinator consulted. Hemoglobin A1c 13.6   Assessment & Plan   Sepsis secondary to UTI vs Bacteremia vs acute cholecystitis -Upon admission, patient did have leukocytosis with tachycardia. -Blood cultures from 11/05/2014: Bacillus species (1/2) -Urine culture showed Staphylococcus, coag negative Initially on vancomycin, switched to cefazolin, discussed with infectious disease Dr. Drucilla Schmidt 5 days of cefazolin/vancomycin as adequate treatment  for this Bacillus species which could possibly be a contaminant -Patient had a colonoscopy in 2012 and is due for one in Jan 2017 (spoke with Dr. Collene Mares), Dr. Dallas Breeding. Collene Mares attempted to perform colonoscopy 4/13,Solid stool was noted throughout the left colon and rectum. The procedure was aborted  Since bacillus cereus could very well be a contaminant and patient has had adequate therapy , he will be discharged and follow up with Dr Collene Mares outpt    Diabetic ketoacidosis/diabetes  mellitus Outpatient Diabetes medications: Amaryl 2 mg, Victoza 1.8 ml daily, Metformin 1000 mg bid, Onglyza 5 mg daily -Resolved, gap has closed. -Hemoglobin A1c 13.6 Diabetes coordinator consultation obtained Patient started on Levemir FlexPen and NovoLog FlexPen Insulin teaching done several times during this hospitalization   Hypotension -Resolved, likely secondary to above processes  Acute kidney injury -Likely secondary to DKA versus sepsis versus hypotension -Renal ultrasound: No hydronephrosis, nonobstructive left caliculi  Hematuria/history of prostate cancer -Will need to follow up with urologist at discharge -Hemoglobin currently stable  Abdominal pain, secondary to acute cholecystitis Initially thought to be secondary to DKA Given that the patient's colonoscopy had to be aborted, we performed CT scan of the abdomen/pelvis to rule out other possibilities including pancreatitis/mass/obstruction, patient found to have acute cholecystitis Status post LAPAROSCOPIC CHOLECYSTECTOMY WITH INTRAOPERATIVE CHOLANGIOGRAM (N/A) as a surgical intervention on 4/15 Tolerating diabetic diet Treated with Zosyn, now discontinued at the time of discharge  Leukocytosis due to acute cholecystitis  -Likely secondary to DKA vs sepsis -Resolved                      Discharge Exam: Blood pressure 126/70, pulse 87, temperature 98 F (36.7 C), temperature source Oral, resp. rate 18, height $RemoveBe'6\' 4"'IyNSIqIYx$  (1.93 m), weight 127.098 kg (280 lb 3.2 oz), SpO2 98 %.   General: Well developed, well nourished, NAD, appears stated age  75: NCAT,mucous membranes moist.   Cardiovascular: S1 S2 auscultated, no rubs, murmurs or gallops. Regular rate and rhythm.  Respiratory: Clear to auscultation bilaterally with equal chest rise  Abdomen: Soft, diffuse abdominal pain, nondistended, + bowel sounds  Extremities: warm dry without cyanosis clubbing or edema  Neuro: AAOx3, nonfocal  Psych:  Normal affect and demeanor         Discharge Instructions    Ambulatory referral to Nutrition and Diabetic Education    Complete by:  As directed   Needs meal planning instruction, new to insulin, on insulin pen.     Diet - low sodium heart healthy    Complete by:  As directed      Diet - low sodium heart healthy    Complete by:  As directed      Increase activity slowly    Complete by:  As directed      Increase activity slowly    Complete by:  As directed            Follow-up Information    Follow up with [name], MD. Schedule an appointment as soon as possible for a visit in 3 days.   Specialty:  Endocrinology   Contact information:   [address] [place] [address] [phone]       Follow up with MANN,JYOTHI, MD. Schedule an appointment as soon as possible for a visit in 3 days.   Specialty:  Gastroenterology   Why:  pls call to schedule colonoscopy   Contact information:   [address], [place], [address] [place] [address]  [phone]       Follow up with [place] On 11/29/2014.   Specialty:  General Surgery   Why:  arrive by 2PM for 2:30PM post op  check   Contact information:   [place] [address] [phone]       Follow up with [name], MD. Schedule an appointment as soon as possible for a visit in 3 days.   Specialty:  Endocrinology   Contact information:   [address] [place] [address] [phone]       Signed: Reyne Dumas 11/13/2014, 11:24 AM

## 2014-11-13 NOTE — Progress Notes (Signed)
Patient ID: Jay Holloway, male   DOB: [birth year], 69 y.o.   MRN: [id number]     [facility]      [address] [place], [place] [address]    Phone: [phone] FAX: [phone]     Subjective: Afebrile.  VSS.  Ambulating.  Having BMs. Sore.    Objective:  Vital signs:  Filed Vitals:   11/12/14 0507 11/12/14 1325 11/12/14 2107 11/13/14 0526  BP: 115/66 118/72 119/69 126/70  Pulse: 104 99 87 87  Temp: 98.4 F (36.9 C) 98.7 F (37.1 C) 98.5 F (36.9 C) 98 F (36.7 C)  TempSrc: Oral Oral Oral Oral  Resp: $Remo'20 18 18 18  'ZUAHU$ Height:      Weight: 123.696 kg (272 lb 11.2 oz)   127.098 kg (280 lb 3.2 oz)  SpO2: 100% 99% 98% 98%    Last BM Date: 11/10/14  Intake/Output   Yesterday:  04/16 0701 - 04/17 0700 In: 960 [P.O.:960] Out: 400 [Urine:400] This shift:    I/O last 3 completed shifts: In: 2254.2 [P.O.:1420; I.V.:784.2; IV Piggyback:50] Out: 1050 [Urine:1050]    Physical Exam: General: Pt awake/alert/oriented x4 in no acute distress Abdomen: Soft.  Nondistended.  Mildly tender at incisions only.  No evidence of peritonitis.  No incarcerated hernias.    Problem List:   Active Problems:   DKA (diabetic ketoacidoses)   Arterial hypotension   Renal failure   Type 2 diabetes mellitus with ketoacidosis without coma   Weakness   Bacteremia   Blood poisoning   Urinary tract infectious disease   Sepsis    Results:   Labs: Results for orders placed or performed during the hospital encounter of 11/05/14 (from the past 48 hour(s))  Glucose, capillary     Status: Abnormal   Collection Time: 11/11/14  9:10 AM  Result Value Ref Range   Glucose-Capillary 232 (H) 70 - 99 mg/dL  Glucose, capillary     Status: Abnormal   Collection Time: 11/11/14 11:40 AM  Result Value Ref Range   Glucose-Capillary 233 (H) 70 - 99 mg/dL   Comment 1 Notify RN    Comment 2 Document in Chart   Glucose, capillary     Status: Abnormal    Collection Time: 11/11/14  4:52 PM  Result Value Ref Range   Glucose-Capillary 385 (H) 70 - 99 mg/dL  Glucose, capillary     Status: Abnormal   Collection Time: 11/11/14  9:23 PM  Result Value Ref Range   Glucose-Capillary 256 (H) 70 - 99 mg/dL  Comprehensive metabolic panel     Status: Abnormal   Collection Time: 11/12/14  5:37 AM  Result Value Ref Range   Sodium 141 135 - 145 mmol/L   Potassium 4.0 3.5 - 5.1 mmol/L   Chloride 105 96 - 112 mmol/L   CO2 23 19 - 32 mmol/L   Glucose, Bld 281 (H) 70 - 99 mg/dL   BUN <5 (L) 6 - 23 mg/dL   Creatinine, Ser 1.17 0.50 - 1.35 mg/dL   Calcium 8.3 (L) 8.4 - 10.5 mg/dL   Total Protein 5.5 (L) 6.0 - 8.3 g/dL   Albumin 2.7 (L) 3.5 - 5.2 g/dL   AST 33 0 - 37 U/L   ALT 35 0 - 53 U/L   Alkaline Phosphatase 67 39 - 117 U/L   Total Bilirubin 0.8 0.3 - 1.2 mg/dL   GFR calc non Af Amer 63 (L) >90 mL/min   GFR calc Af  Amer 73 (L) >90 mL/min    Comment: (NOTE) The eGFR has been calculated using the CKD EPI equation. This calculation has not been validated in all clinical situations. eGFR's persistently <90 mL/min signify possible Chronic Kidney Disease.    Anion gap 13 5 - 15  Lipase, blood     Status: None   Collection Time: 11/12/14  5:37 AM  Result Value Ref Range   Lipase 17 11 - 59 U/L  Glucose, capillary     Status: Abnormal   Collection Time: 11/12/14  7:49 AM  Result Value Ref Range   Glucose-Capillary 261 (H) 70 - 99 mg/dL  Glucose, capillary     Status: Abnormal   Collection Time: 11/12/14 12:54 PM  Result Value Ref Range   Glucose-Capillary 267 (H) 70 - 99 mg/dL  Glucose, capillary     Status: Abnormal   Collection Time: 11/12/14  4:59 PM  Result Value Ref Range   Glucose-Capillary 224 (H) 70 - 99 mg/dL  Glucose, capillary     Status: Abnormal   Collection Time: 11/12/14  9:06 PM  Result Value Ref Range   Glucose-Capillary 196 (H) 70 - 99 mg/dL  Glucose, capillary     Status: Abnormal   Collection Time: 11/13/14  7:38 AM   Result Value Ref Range   Glucose-Capillary 181 (H) 70 - 99 mg/dL    Imaging / Studies: Dg Cholangiogram Operative  11/11/2014   CLINICAL DATA:  Cholelithiasis  EXAM: INTRAOPERATIVE CHOLANGIOGRAM  TECHNIQUE: Cholangiographic images from the C-arm fluoroscopic device were submitted for interpretation post-operatively. Please see the procedural report for the amount of contrast and the fluoroscopy time utilized.  COMPARISON:  None.  FINDINGS: Contrast fills the biliary tree and duodenum without filling defects in the common bile duct.  IMPRESSION: Patent biliary tree without evidence of common bile duct stones.   Electronically Signed   By: Marybelle Killings M.D.   On: 11/11/2014 12:52    Medications / Allergies:  Scheduled Meds: . heparin  5,000 Units Subcutaneous 3 times per day  . insulin aspart  0-15 Units Subcutaneous TID WC  . insulin aspart  0-5 Units Subcutaneous QHS  . insulin glargine  40 Units Subcutaneous Daily  . piperacillin-tazobactam (ZOSYN)  IV  3.375 g Intravenous 3 times per day  . polyethylene glycol  17 g Oral BID   Continuous Infusions: . sodium chloride 50 mL/hr at 11/13/14 0529   PRN Meds:.fentaNYL (SUBLIMAZE) injection, oxyCODONE-acetaminophen  Antibiotics: Anti-infectives    Start     Dose/Rate Route Frequency Ordered Stop   11/10/14 1430  piperacillin-tazobactam (ZOSYN) IVPB 3.375 g     3.375 g 12.5 mL/hr over 240 Minutes Intravenous 3 times per day 11/10/14 1350     11/09/14 1400  ceFAZolin (ANCEF) IVPB 1 g/50 mL premix  Status:  Discontinued     1 g 100 mL/hr over 30 Minutes Intravenous 3 times per day 11/09/14 0739 11/10/14 1258   11/09/14 0600  ceFAZolin (ANCEF) IVPB 1 g/50 mL premix  Status:  Discontinued     1 g 100 mL/hr over 30 Minutes Intravenous 4 times per day 11/08/14 1526 11/09/14 0739   11/08/14 1300  vancomycin (VANCOCIN) 1,500 mg in sodium chloride 0.9 % 500 mL IVPB  Status:  Discontinued     1,500 mg 250 mL/hr over 120 Minutes Intravenous  Every 24 hours 11/07/14 1142 11/08/14 1526   11/07/14 1145  vancomycin (VANCOCIN) 2,000 mg in sodium chloride 0.9 % 500 mL IVPB  2,000 mg 250 mL/hr over 120 Minutes Intravenous  Once 11/07/14 1142 11/07/14 1520   11/05/14 2100  cefTRIAXone (ROCEPHIN) 1 g in dextrose 5 % 50 mL IVPB - Premix  Status:  Discontinued     1 g 100 mL/hr over 30 Minutes Intravenous Every 24 hours 11/05/14 1952 11/07/14 1003        Assessment/Plan Calculus of gallbladder with acute cholecystitis POD#2 laparoscopic cholecystectomy---Dr. Corentt -tolerating PO, pain controlled, ambulating, having BMs, afebrile. -Stable for DC from surgical standpoint -instructions provided -post op check provided   Erby Pian, [facility] Pager [phone](7A-4:30P) For consults and floor pages call [phone](7A-4:30P)  11/13/2014 8:35 AM

## 2014-11-14 ENCOUNTER — Encounter (HOSPITAL_COMMUNITY): Payer: Self-pay | Admitting: Surgery

## 2014-11-14 NOTE — Progress Notes (Signed)
Notified at approx 2010 regarding pt (Mr Elmore Hyslop) d/c'd today. RN reports he went to get Rx for Levemir and Novolog Flex-pens. Pharmacists informed pt it would cost $900.00 which pt can not afford. Pt states it is an issue w/ his insurance company. I called in Lantus 35 units sq qhs at 10 pm and  and Novolog 6 units TID w/ meals (per d/c dosages) to the [facility] as his pharmacy was closed. Ask that RN encourage pt to f/u w/ hospitalists tomorrow for further instruction.   Jeryl Columbia, NP-C [facility] Pager [phone]

## 2014-11-21 ENCOUNTER — Encounter: Payer: Self-pay | Admitting: Dietician

## 2014-11-21 ENCOUNTER — Encounter: Payer: Medicare Other | Attending: Internal Medicine | Admitting: Dietician

## 2014-11-21 VITALS — Ht 76.0 in | Wt 273.0 lb

## 2014-11-21 DIAGNOSIS — E1310 Other specified diabetes mellitus with ketoacidosis without coma: Secondary | ICD-10-CM | POA: Diagnosis not present

## 2014-11-21 DIAGNOSIS — Z713 Dietary counseling and surveillance: Secondary | ICD-10-CM | POA: Insufficient documentation

## 2014-11-21 DIAGNOSIS — E1110 Type 2 diabetes mellitus with ketoacidosis without coma: Secondary | ICD-10-CM

## 2014-11-21 DIAGNOSIS — Z794 Long term (current) use of insulin: Secondary | ICD-10-CM | POA: Diagnosis not present

## 2014-11-21 NOTE — Patient Instructions (Signed)
Exercise as able and doctor allows.  Start with 10 minutes daily and increase as tolerated. Watch your portion sizes of carbohydrates.  Continue to read the label. Make half your plate non starchy vegetables. Bake, broil, or grill rather than fried Aim for 50-60 grams of fat per day Aim for 3-4 carb choices per meal (45-60 grams) Aim for 0-2 carb choices per snack if hungry

## 2014-11-21 NOTE — Progress Notes (Signed)
Diabetes Self-Management Education  Visit Type:   Appt. Start Time: 1000 Appt. End Time: 1130  11/21/2014  Mr. [employer], identified by name and date of birth, is a 69 y.o. male with a diagnosis of Diabetes: Type 2.  Other people present during visit:  Patient   Patient with recent hospitalization for DKA and sepsis.  S/p cholecystectomy.  Discharged 11/12/15.  UBW 340 lbs 1 1/2 year ago with weight loss to 273 lbs today to try to improve diabetes control.  He lives alone.  Has gotten some assistance from male friend and nephew.  States that he was to have home health services after discharge but had not been contacted.  ASSESSMENT  Height 6\' 4"  (1.93 m), weight 273 lb (123.832 kg). Body mass index is 33.24 kg/(m^2).  Initial Visit Information:  Are you currently following a meal plan?: No   Are you taking your medications as prescribed?: Yes Are you checking your feet?: No   How often do you need to have someone help you when you read instructions, pamphlets, or other written materials from your doctor or pharmacy?: 1 - Never What is the last grade level you completed in school?: 1 year college  Psychosocial:     Patient Belief/Attitude about Diabetes: Motivated to manage diabetes Self-care barriers: Other (comment) (limited support) Self-management support: Doctor's office, Friends Other persons present: Patient Patient Concerns: Nutrition/Meal planning, Healthy Lifestyle, Monitoring Preferred Learning Style: No preference indicated Learning Readiness: Ready  Complications:   Last HgB A1C per patient/outside source: 13.6 mg/dL (11/09/14) How often do you check your blood sugar?: 3-4 times/day (2-3 (before breakfast, before bedtime mostly)) Fasting Blood glucose range (mg/dL): 70-129 Postprandial Blood glucose range (mg/dL): 130-179 Number of hypoglycemic episodes per month: 2 Number of hyperglycemic episodes per week: 0 Have you had a dilated eye exam in the past 12  months?: Yes Have you had a dental exam in the past 12 months?: No  Diet Intake:  Breakfast: eggs with sausage, 1 slice [employer] toast, or  cornflakes with blueberries and skim milk or bagel with butter and cream cheese or sausage & egg biscuit, black coffee Snack (morning): fruit Lunch: tuna sandwich or 6" sub  Snack (afternoon): peanuts and fruit or potato chips occasionally Dinner: frozen dinner or same as lunch or pasta with meat sauce or soup Snack (evening): sugar free ice cream pops or nothing Beverage(s): black coffee, water, skim milk, diet gingerale, juice  Exercise:  Exercise: ADL's (currently is recovering from gallbladder surgery.  Did mow his own grass.)  Individualized Plan for Diabetes Self-Management Training:   Learning Objective:  Patient will have a greater understanding of diabetes self-management.  Patient education plan per assessed needs and concerns is to attend individual sessions for     Education Topics Reviewed with Patient Today:  Definition of diabetes, type 1 and 2, and the diagnosis of diabetes Role of diet in the treatment of diabetes and the relationship between the three main macronutrients and blood glucose level, Food label reading, portion sizes and measuring food., Carbohydrate counting, Meal timing in regards to the patients' current diabetes medication., Meal options for control of blood glucose level and chronic complications. Role of exercise on diabetes management, blood pressure control and cardiac health.   Taught/discussed recording of test results and interpretation of SMBG., Identified appropriate SMBG and/or A1C goals., Daily foot exams, Yearly dilated eye exam Taught treatment of hypoglycemia - the 15 rule. Relationship between chronic complications and blood glucose control Worked with patient  to identify barriers to care and solutions, Role of stress on diabetes, Helped patient identify a support system for diabetes management (reviewed  hospital chart, found [facility] as patient choice per care manager note and porvided phone number.  patient was for RN/PT/OT, aid support)   Lifestyle issues that need to be addressed for better diabetes care  PATIENTS GOALS/Plan (Developed by the patient):  Nutrition: Follow meal plan discussed, General guidelines for healthy choices and portions discussed Physical Activity: Not Applicable (recovering from colecystectomy.  Exercise guidelines per MD.  Sunday Corn waling as patient feels able.) Medications: take my medication as prescribed Monitoring : test my blood glucose as discussed (note x per day with comment) (check blood sugar as per recommendations by MD)  Plan:   Patient Instructions  Exercise as able and doctor allows.  Start with 10 minutes daily and increase as tolerated. Watch your portion sizes of carbohydrates.  Continue to read the label. Make half your plate non starchy vegetables. Bake, broil, or grill rather than fried Aim for 50-60 grams of fat per day Aim for 3-4 carb choices per meal (45-60 grams) Aim for 0-2 carb choices per snack if hungry    Expected Outcomes:  Demonstrated interest in learning. Expect positive outcomes  Education material provided: Living Well with Diabetes, A1C conversion sheet, Meal plan card, My Plate and Snack sheet  If problems or questions, patient to contact team via:  Phone and Email  Future DSME appointment: PRN

## 2015-08-01 DIAGNOSIS — I10 Essential (primary) hypertension: Secondary | ICD-10-CM | POA: Diagnosis not present

## 2015-08-01 DIAGNOSIS — E118 Type 2 diabetes mellitus with unspecified complications: Secondary | ICD-10-CM | POA: Diagnosis not present

## 2015-08-01 DIAGNOSIS — Z0000 Encounter for general adult medical examination without abnormal findings: Secondary | ICD-10-CM | POA: Diagnosis not present

## 2015-08-08 DIAGNOSIS — E118 Type 2 diabetes mellitus with unspecified complications: Secondary | ICD-10-CM | POA: Diagnosis not present

## 2015-08-08 DIAGNOSIS — I10 Essential (primary) hypertension: Secondary | ICD-10-CM | POA: Diagnosis not present

## 2015-10-31 DIAGNOSIS — I10 Essential (primary) hypertension: Secondary | ICD-10-CM | POA: Diagnosis not present

## 2015-10-31 DIAGNOSIS — E118 Type 2 diabetes mellitus with unspecified complications: Secondary | ICD-10-CM | POA: Diagnosis not present

## 2015-11-07 DIAGNOSIS — E789 Disorder of lipoprotein metabolism, unspecified: Secondary | ICD-10-CM | POA: Diagnosis not present

## 2015-11-07 DIAGNOSIS — E118 Type 2 diabetes mellitus with unspecified complications: Secondary | ICD-10-CM | POA: Diagnosis not present

## 2015-11-07 DIAGNOSIS — I10 Essential (primary) hypertension: Secondary | ICD-10-CM | POA: Diagnosis not present

## 2015-12-04 DIAGNOSIS — E789 Disorder of lipoprotein metabolism, unspecified: Secondary | ICD-10-CM | POA: Diagnosis not present

## 2015-12-04 DIAGNOSIS — E118 Type 2 diabetes mellitus with unspecified complications: Secondary | ICD-10-CM | POA: Diagnosis not present

## 2015-12-04 DIAGNOSIS — I10 Essential (primary) hypertension: Secondary | ICD-10-CM | POA: Diagnosis not present

## 2015-12-04 DIAGNOSIS — C61 Malignant neoplasm of prostate: Secondary | ICD-10-CM | POA: Diagnosis not present

## 2016-03-28 DIAGNOSIS — E118 Type 2 diabetes mellitus with unspecified complications: Secondary | ICD-10-CM | POA: Diagnosis not present

## 2016-03-28 DIAGNOSIS — I10 Essential (primary) hypertension: Secondary | ICD-10-CM | POA: Diagnosis not present

## 2016-04-04 DIAGNOSIS — E118 Type 2 diabetes mellitus with unspecified complications: Secondary | ICD-10-CM | POA: Diagnosis not present

## 2016-04-04 DIAGNOSIS — Z23 Encounter for immunization: Secondary | ICD-10-CM | POA: Diagnosis not present

## 2016-04-04 DIAGNOSIS — E789 Disorder of lipoprotein metabolism, unspecified: Secondary | ICD-10-CM | POA: Diagnosis not present

## 2016-04-04 DIAGNOSIS — I10 Essential (primary) hypertension: Secondary | ICD-10-CM | POA: Diagnosis not present

## 2016-04-29 DIAGNOSIS — E119 Type 2 diabetes mellitus without complications: Secondary | ICD-10-CM | POA: Diagnosis not present

## 2016-04-29 DIAGNOSIS — H35033 Hypertensive retinopathy, bilateral: Secondary | ICD-10-CM | POA: Diagnosis not present

## 2016-04-29 DIAGNOSIS — H35362 Drusen (degenerative) of macula, left eye: Secondary | ICD-10-CM | POA: Diagnosis not present

## 2016-04-29 DIAGNOSIS — H25013 Cortical age-related cataract, bilateral: Secondary | ICD-10-CM | POA: Diagnosis not present

## 2016-04-29 DIAGNOSIS — H2513 Age-related nuclear cataract, bilateral: Secondary | ICD-10-CM | POA: Diagnosis not present

## 2016-05-08 DIAGNOSIS — N5201 Erectile dysfunction due to arterial insufficiency: Secondary | ICD-10-CM | POA: Diagnosis not present

## 2016-05-08 DIAGNOSIS — R3121 Asymptomatic microscopic hematuria: Secondary | ICD-10-CM | POA: Diagnosis not present

## 2016-05-08 DIAGNOSIS — Z8546 Personal history of malignant neoplasm of prostate: Secondary | ICD-10-CM | POA: Diagnosis not present

## 2016-05-16 DIAGNOSIS — N390 Urinary tract infection, site not specified: Secondary | ICD-10-CM | POA: Diagnosis not present

## 2016-05-16 DIAGNOSIS — E118 Type 2 diabetes mellitus with unspecified complications: Secondary | ICD-10-CM | POA: Diagnosis not present

## 2016-05-16 DIAGNOSIS — I10 Essential (primary) hypertension: Secondary | ICD-10-CM | POA: Diagnosis not present

## 2016-05-16 DIAGNOSIS — Z125 Encounter for screening for malignant neoplasm of prostate: Secondary | ICD-10-CM | POA: Diagnosis not present

## 2016-05-23 DIAGNOSIS — E109 Type 1 diabetes mellitus without complications: Secondary | ICD-10-CM | POA: Diagnosis not present

## 2016-05-23 DIAGNOSIS — I10 Essential (primary) hypertension: Secondary | ICD-10-CM | POA: Diagnosis not present

## 2016-05-23 DIAGNOSIS — E118 Type 2 diabetes mellitus with unspecified complications: Secondary | ICD-10-CM | POA: Diagnosis not present

## 2016-05-23 DIAGNOSIS — C61 Malignant neoplasm of prostate: Secondary | ICD-10-CM | POA: Diagnosis not present

## 2016-05-29 ENCOUNTER — Ambulatory Visit: Payer: Self-pay | Admitting: Podiatry

## 2016-06-05 ENCOUNTER — Encounter: Payer: Self-pay | Admitting: Podiatry

## 2016-06-05 ENCOUNTER — Ambulatory Visit (INDEPENDENT_AMBULATORY_CARE_PROVIDER_SITE_OTHER): Payer: Commercial Managed Care - HMO | Admitting: Podiatry

## 2016-06-05 VITALS — BP 145/83 | HR 102 | Temp 99.1°F | Resp 18

## 2016-06-05 DIAGNOSIS — S91209A Unspecified open wound of unspecified toe(s) with damage to nail, initial encounter: Secondary | ICD-10-CM

## 2016-06-05 DIAGNOSIS — B351 Tinea unguium: Secondary | ICD-10-CM

## 2016-06-05 DIAGNOSIS — M79676 Pain in unspecified toe(s): Secondary | ICD-10-CM

## 2016-06-05 NOTE — Progress Notes (Signed)
   Subjective:    Patient ID: Jay Holloway, male    DOB: 1945/08/06, 70 y.o.   MRN: [id number]  HPI    This patient presents today complaining that in the past month the left hallux toenail was beginning to become sore and tender with occasional swelling and drainage. He says that over time he has self treated it and the symptoms have improved, however, the left hallux toenail is beginning to lift off the nailbed. Patient would like to have this evaluated. He also says that the Restasis toenails are thick in brittle and he has difficulty trimming them. Patient states he is a diabetic for approximately 2 years and denies any history of foot ulceration, claudication or amputation  Patient states he is a nonsmoker   Review of Systems  All other systems reviewed and are negative.      Objective:   Physical Exam  Orientated 3  Vascular: Mild pitting edema ankles bilaterally No calf pain or calf tenderness bilaterally DP and PT pulses 2/4 bilaterally Capillary reflex immediate bilaterally  Neurological: Sensation to 10 g monofilament wire intact 5/5 bilaterally Vibratory sensation reactive right nonreactive left Ankle reflexes reactive bilaterally  Dermatological: No open skin lesions bilaterally Dry skin bilaterally The left hallux nail plate is lifting off the nailbed. There is no surrounding erythema, edema, drainage, malodor surrounding left hallux nail. The left hallux nails deformed in discolored The remaining toenails 9 are hypertrophic, deformed in discolored  Musculoskeletal: Manual motor testing: Dorsi flexion, plantar flexion, inversion, eversion 5/5 bilaterally       Assessment & Plan:   Assessment: Diabetic with satisfactory neurovascular status Mild peripheral edema Partial traumatic nail avulsion left hallux without clinical sign of infection Mycotic toenails 10  Plan: Today review the results of the exam with patient today. I recommended debridement  of all the toenails including left hallux nail The left hallux toenail was easily debrided without any bleeding. The underlying nailbed was dry. There is no active drainage, malodor, warmth, edema in the left hallux or in the nailbed If sensitive a recommended Vaseline or antibiotic ointment and cover with a Band-Aid until sensitivity resolves The remaining toenails 9 were debrided mechanically and leg without any bleeding  Reappoint as needed or 3 months

## 2016-06-05 NOTE — Patient Instructions (Signed)
Your diabetic foot examination demonstrated adequate circulation and adequate feeling in your feet The left great toenail was debrided off and is no clinical sign of infection Apply topical antibiotic ointment or Vaseline to the left great toenail bed and cover with a Band-Aid until all sensitivity ends Today I also trimmed all the toenails are in your right and left feet Return as needed if you have any future concerns or possibly every 3-4 months for trimming of toenails  Diabetes and Foot Care Diabetes may cause you to have problems because of poor blood supply (circulation) to your feet and legs. This may cause the skin on your feet to become thinner, break easier, and heal more slowly. Your skin may become dry, and the skin may peel and crack. You may also have nerve damage in your legs and feet causing decreased feeling in them. You may not notice minor injuries to your feet that could lead to infections or more serious problems. Taking care of your feet is one of the most important things you can do for yourself.  HOME CARE INSTRUCTIONS  Wear shoes at all times, even in the house. Do not go barefoot. Bare feet are easily injured.  Check your feet daily for blisters, cuts, and redness. If you cannot see the bottom of your feet, use a mirror or ask someone for help.  Wash your feet with warm water (do not use hot water) and mild soap. Then pat your feet and the areas between your toes until they are completely dry. Do not soak your feet as this can dry your skin.  Apply a moisturizing lotion or petroleum jelly (that does not contain alcohol and is unscented) to the skin on your feet and to dry, brittle toenails. Do not apply lotion between your toes.  Trim your toenails straight across. Do not dig under them or around the cuticle. File the edges of your nails with an emery board or nail file.  Do not cut corns or calluses or try to remove them with medicine.  Wear clean socks or stockings  every day. Make sure they are not too tight. Do not wear knee-high stockings since they may decrease blood flow to your legs.  Wear shoes that fit properly and have enough cushioning. To break in new shoes, wear them for just a few hours a day. This prevents you from injuring your feet. Always look in your shoes before you put them on to be sure there are no objects inside.  Do not cross your legs. This may decrease the blood flow to your feet.  If you find a minor scrape, cut, or break in the skin on your feet, keep it and the skin around it clean and dry. These areas may be cleansed with mild soap and water. Do not cleanse the area with peroxide, alcohol, or iodine.  When you remove an adhesive bandage, be sure not to damage the skin around it.  If you have a wound, look at it several times a day to make sure it is healing.  Do not use heating pads or hot water bottles. They may burn your skin. If you have lost feeling in your feet or legs, you may not know it is happening until it is too late.  Make sure your health care provider performs a complete foot exam at least annually or more often if you have foot problems. Report any cuts, sores, or bruises to your health care provider immediately. [place]  IF:   You have an injury that is not healing.  You have cuts or breaks in the skin.  You have an ingrown nail.  You notice redness on your legs or feet.  You feel burning or tingling in your legs or feet.  You have pain or cramps in your legs and feet.  Your legs or feet are numb.  Your feet always feel cold. SEEK IMMEDIATE MEDICAL CARE IF:   There is increasing redness, swelling, or pain in or around a wound.  There is a red line that goes up your leg.  Pus is coming from a wound.  You develop a fever or as directed by your health care provider.  You notice a bad smell coming from an ulcer or wound.   This information is not intended to replace advice given to  you by your health care provider. Make sure you discuss any questions you have with your health care provider.   Document Released: 07/12/2000 Document Revised: 03/17/2013 Document Reviewed: 12/22/2012 Elsevier Interactive Patient Education [employer].

## 2016-09-16 DIAGNOSIS — E118 Type 2 diabetes mellitus with unspecified complications: Secondary | ICD-10-CM | POA: Diagnosis not present

## 2016-09-16 DIAGNOSIS — I10 Essential (primary) hypertension: Secondary | ICD-10-CM | POA: Diagnosis not present

## 2016-09-16 DIAGNOSIS — E789 Disorder of lipoprotein metabolism, unspecified: Secondary | ICD-10-CM | POA: Diagnosis not present

## 2016-09-24 DIAGNOSIS — E118 Type 2 diabetes mellitus with unspecified complications: Secondary | ICD-10-CM | POA: Diagnosis not present

## 2016-09-24 DIAGNOSIS — E789 Disorder of lipoprotein metabolism, unspecified: Secondary | ICD-10-CM | POA: Diagnosis not present

## 2016-10-09 DIAGNOSIS — E118 Type 2 diabetes mellitus with unspecified complications: Secondary | ICD-10-CM | POA: Diagnosis not present

## 2016-10-09 DIAGNOSIS — I10 Essential (primary) hypertension: Secondary | ICD-10-CM | POA: Diagnosis not present

## 2016-10-16 DIAGNOSIS — E789 Disorder of lipoprotein metabolism, unspecified: Secondary | ICD-10-CM | POA: Diagnosis not present

## 2016-10-16 DIAGNOSIS — E118 Type 2 diabetes mellitus with unspecified complications: Secondary | ICD-10-CM | POA: Diagnosis not present

## 2016-10-16 DIAGNOSIS — I10 Essential (primary) hypertension: Secondary | ICD-10-CM | POA: Diagnosis not present

## 2016-11-13 DIAGNOSIS — I10 Essential (primary) hypertension: Secondary | ICD-10-CM | POA: Diagnosis not present

## 2016-11-13 DIAGNOSIS — E118 Type 2 diabetes mellitus with unspecified complications: Secondary | ICD-10-CM | POA: Diagnosis not present

## 2016-11-20 DIAGNOSIS — E118 Type 2 diabetes mellitus with unspecified complications: Secondary | ICD-10-CM | POA: Diagnosis not present

## 2016-11-20 DIAGNOSIS — E789 Disorder of lipoprotein metabolism, unspecified: Secondary | ICD-10-CM | POA: Diagnosis not present

## 2016-11-20 DIAGNOSIS — I10 Essential (primary) hypertension: Secondary | ICD-10-CM | POA: Diagnosis not present

## 2017-04-01 DIAGNOSIS — E118 Type 2 diabetes mellitus with unspecified complications: Secondary | ICD-10-CM | POA: Diagnosis not present

## 2017-04-08 DIAGNOSIS — I10 Essential (primary) hypertension: Secondary | ICD-10-CM | POA: Diagnosis not present

## 2017-04-08 DIAGNOSIS — Z6841 Body Mass Index (BMI) 40.0 and over, adult: Secondary | ICD-10-CM | POA: Diagnosis not present

## 2017-04-08 DIAGNOSIS — E118 Type 2 diabetes mellitus with unspecified complications: Secondary | ICD-10-CM | POA: Diagnosis not present

## 2017-04-08 DIAGNOSIS — E785 Hyperlipidemia, unspecified: Secondary | ICD-10-CM | POA: Diagnosis not present

## 2017-04-08 DIAGNOSIS — Z23 Encounter for immunization: Secondary | ICD-10-CM | POA: Diagnosis not present

## 2017-05-01 DIAGNOSIS — H2513 Age-related nuclear cataract, bilateral: Secondary | ICD-10-CM | POA: Diagnosis not present

## 2017-05-01 DIAGNOSIS — E119 Type 2 diabetes mellitus without complications: Secondary | ICD-10-CM | POA: Diagnosis not present

## 2017-05-01 DIAGNOSIS — H25013 Cortical age-related cataract, bilateral: Secondary | ICD-10-CM | POA: Diagnosis not present

## 2017-05-01 DIAGNOSIS — H35362 Drusen (degenerative) of macula, left eye: Secondary | ICD-10-CM | POA: Diagnosis not present

## 2017-05-01 DIAGNOSIS — H35033 Hypertensive retinopathy, bilateral: Secondary | ICD-10-CM | POA: Diagnosis not present

## 2017-05-06 DIAGNOSIS — E118 Type 2 diabetes mellitus with unspecified complications: Secondary | ICD-10-CM | POA: Diagnosis not present

## 2017-05-06 DIAGNOSIS — E789 Disorder of lipoprotein metabolism, unspecified: Secondary | ICD-10-CM | POA: Diagnosis not present

## 2017-05-06 DIAGNOSIS — I10 Essential (primary) hypertension: Secondary | ICD-10-CM | POA: Diagnosis not present

## 2017-05-07 DIAGNOSIS — Z8546 Personal history of malignant neoplasm of prostate: Secondary | ICD-10-CM | POA: Diagnosis not present

## 2017-05-14 DIAGNOSIS — Z8546 Personal history of malignant neoplasm of prostate: Secondary | ICD-10-CM | POA: Diagnosis not present

## 2017-05-14 DIAGNOSIS — N5201 Erectile dysfunction due to arterial insufficiency: Secondary | ICD-10-CM | POA: Diagnosis not present

## 2017-07-01 DIAGNOSIS — E789 Disorder of lipoprotein metabolism, unspecified: Secondary | ICD-10-CM | POA: Diagnosis not present

## 2017-07-01 DIAGNOSIS — E118 Type 2 diabetes mellitus with unspecified complications: Secondary | ICD-10-CM | POA: Diagnosis not present

## 2017-07-10 DIAGNOSIS — I10 Essential (primary) hypertension: Secondary | ICD-10-CM | POA: Diagnosis not present

## 2017-07-10 DIAGNOSIS — E118 Type 2 diabetes mellitus with unspecified complications: Secondary | ICD-10-CM | POA: Diagnosis not present

## 2017-09-25 ENCOUNTER — Ambulatory Visit (HOSPITAL_COMMUNITY)
Admission: EM | Admit: 2017-09-25 | Discharge: 2017-09-25 | Disposition: A | Discharge status: Home / Self Care | Payer: Medicare HMO | Attending: Physician Assistant | Admitting: Physician Assistant

## 2017-09-25 ENCOUNTER — Ambulatory Visit (INDEPENDENT_AMBULATORY_CARE_PROVIDER_SITE_OTHER): Payer: Medicare HMO

## 2017-09-25 ENCOUNTER — Other Ambulatory Visit: Payer: Self-pay

## 2017-09-25 ENCOUNTER — Encounter (HOSPITAL_COMMUNITY): Payer: Self-pay | Admitting: Emergency Medicine

## 2017-09-25 DIAGNOSIS — S92512A Displaced fracture of proximal phalanx of left lesser toe(s), initial encounter for closed fracture: Secondary | ICD-10-CM

## 2017-09-25 DIAGNOSIS — S92415A Nondisplaced fracture of proximal phalanx of left great toe, initial encounter for closed fracture: Secondary | ICD-10-CM | POA: Diagnosis not present

## 2017-09-25 DIAGNOSIS — S92511A Displaced fracture of proximal phalanx of right lesser toe(s), initial encounter for closed fracture: Secondary | ICD-10-CM | POA: Diagnosis not present

## 2017-09-25 DIAGNOSIS — M7989 Other specified soft tissue disorders: Secondary | ICD-10-CM | POA: Diagnosis not present

## 2017-09-25 DIAGNOSIS — S99912A Unspecified injury of left ankle, initial encounter: Secondary | ICD-10-CM | POA: Diagnosis not present

## 2017-09-25 MED ORDER — MELOXICAM 7.5 MG PO TABS: 7.5000 mg | ORAL_TABLET | Freq: Every day | ORAL | 0 refills | Status: DC

## 2017-09-25 NOTE — ED Provider Notes (Signed)
Jay Holloway    CSN: [id number] Arrival date & time: 09/25/17  1448     History   Chief Complaint Chief Complaint  Patient presents with  . Fall    HPI Jay Holloway is a 72 y.o. male.   72 year old male with history of DM, HTN, HLD comes in for left ankle/foot pain after falling 4 days ago.  States he slipped in his backyard when it was raining, fell to his side, and in process twisted his ankle.  Denies head injury or loss of consciousness.  States he was able to ambulate after the accident, but had painful weightbearing to the ankle/foot since then.  He also experiences pain of the lower leg and knee during weightbearing.  Swelling to the left ankle, with contusion and swelling of the left great toe.  He has not done anything for it.  States he finally was able to walk with crutches today, and came in for evaluation. States bilateral knee and lower leg doing better with the pain, but ankle and toe pain continues.       Past Medical History:  Diagnosis Date  . Arthritis    trouble turning head side to side and up  . Diabetes mellitus without complication ([place])   . Hemorrhoids   . High cholesterol   . Hypertension   . Prostate cancer [facility])     Patient Active Problem List   Diagnosis Date Noted  . Sepsis ([place]) 11/08/2014  . Arterial hypotension   . Renal failure   . Type 2 diabetes mellitus with ketoacidosis without coma ([place])   . Weakness   . Bacteremia   . Blood poisoning   . Urinary tract infectious disease   . DKA (diabetic ketoacidoses) ([place]) 11/05/2014    Past Surgical History:  Procedure Laterality Date  . CHOLECYSTECTOMY N/A 11/11/2014   Procedure: LAPAROSCOPIC CHOLECYSTECTOMY WITH INTRAOPERATIVE CHOLANGIOGRAM;  Surgeon: Erroll Luna, MD;  Location: [facility];  Service: General;  Laterality: N/A;  . COLONOSCOPY N/A 11/09/2014   Procedure: COLONOSCOPY;  Surgeon: Juanita Craver, MD;  Location: [facility] ENDOSCOPY;  Service: Endoscopy;  Laterality: N/A;  .  CYSTOSCOPY WITH RETROGRADE PYELOGRAM, URETEROSCOPY AND STENT PLACEMENT Left 04/18/2014   Procedure: CYSTOSCOPY WITH RETROGRADE PYELOGRAM, URETEROSCOPY AND STENT PLACEMENT;  Surgeon: Raynelle Bring, MD;  Location: [facility] ORS;  Service: Urology;  Laterality: Left;  . HOLMIUM LASER APPLICATION Left 2/59/5638   Procedure: HOLMIUM LASER APPLICATION;  Surgeon: Raynelle Bring, MD;  Location: [facility] ORS;  Service: Urology;  Laterality: Left;  . LYMPHADENECTOMY  06/04/2012   Procedure: LYMPHADENECTOMY;  Surgeon: Dutch Gray, MD;  Location: [facility] ORS;  Service: Urology;  Laterality: Bilateral;  . right  achilles tendon repair  yrs ago  . ROBOT ASSISTED LAPAROSCOPIC RADICAL PROSTATECTOMY  06/04/2012   Procedure: ROBOTIC ASSISTED LAPAROSCOPIC RADICAL PROSTATECTOMY LEVEL 3;  Surgeon: Dutch Gray, MD;  Location: [facility] ORS;  Service: Urology;  Laterality: N/A;       Home Medications    Prior to Admission medications   Medication Sig Start Date End Date Taking? Authorizing Provider  amLODipine (NORVASC) 10 MG tablet Take 10 mg by mouth daily.   Yes [provider]  BD PEN NEEDLE NANO U/F 32G X 4 MM MISC  09/21/14  Yes [provider]  insulin glargine (LANTUS) 100 UNIT/ML injection Inject 35 Units into the skin at bedtime.   Yes [provider]  insulin lispro (HUMALOG) 100 UNIT/ML injection Inject 6 Units into the skin 3 (three) times daily before meals.  Yes [provider]  rosuvastatin (CRESTOR) 10 MG tablet Take 10 mg by mouth every morning.   Yes [provider]  irbesartan (AVAPRO) 300 MG tablet Take 300 mg by mouth daily. 10/28/14   [provider]  meloxicam (MOBIC) 7.5 MG tablet Take 1 tablet (7.5 mg total) by mouth daily. 09/25/17   Ok Edwards, PA-C    Family History History reviewed. No pertinent family history.  Social History Social History   Tobacco Use  . Smoking status: Former Smoker    Packs/day: 0.25    Years: 4.00    Pack years: 1.00    Types:  Cigarettes    Last attempt to quit: 12/16/1978    Years since quitting: 38.8  . Smokeless tobacco: Never Used  Substance Use Topics  . Alcohol use: No  . Drug use: No     Allergies   Patient has no known allergies.   Review of Systems Review of Systems  Reason unable to perform ROS: See HPI as above.     Physical Exam Triage Vital Signs ED Triage Vitals  Enc Vitals Group     BP 09/25/17 1605 140/69     Pulse Rate 09/25/17 1605 89     Resp --      Temp 09/25/17 1605 98.5 F (36.9 C)     Temp Source 09/25/17 1605 Oral     SpO2 09/25/17 1605 98 %     Weight --      Height --      Head Circumference --      Peak Flow --      Pain Score 09/25/17 1601 6     Pain Loc --      Pain Edu? --      Excl. in [place]? --    No data found.  Updated Vital Signs BP 140/69 (BP Location: Right Arm)   Pulse 89   Temp 98.5 F (36.9 C) (Oral)   SpO2 98%   Physical Exam  Constitutional: He is oriented to person, place, and time. He appears well-developed and well-nourished. No distress.  HENT:  Head: Normocephalic and atraumatic.  Eyes: Conjunctivae are normal. Pupils are equal, round, and reactive to light.  Musculoskeletal:  Diffuse swelling of the left ankle with contusion and swelling of the left great toe.  No obvious tenderness to palpation of the ankle.  Tenderness to palpation of the left great toe.  No obvious tenderness on palpation of the MTPs. Decreased range of motion of ankle.  Strength deferred. Sensation intact.   Neurological: He is alert and oriented to person, place, and time.    UC Treatments / Results  Labs (all labs ordered are listed, but only abnormal results are displayed) Labs Reviewed - No data to display  EKG  EKG Interpretation None       Radiology Dg Ankle Complete Left  Result Date: 09/25/2017 CLINICAL DATA:  Fall with swelling EXAM: LEFT ANKLE COMPLETE - 3+ VIEW COMPARISON:  Foot radiograph 09/25/2017 FINDINGS: Marked soft tissue swelling.  The ankle mortise is symmetric. Numerous ossicles or old avulsion injuries and osteophytes along the medial and lateral joint space. Suspect nondisplaced acute medial malleolar fracture. Possible oblique nondisplaced fracture through the distal shaft and metaphysis of the fibula. Prominent posterior and plantar calcaneal bone spurs. IMPRESSION: Significant soft tissue swelling. Suspect nondisplaced medial malleolar fracture. Possible nondisplaced distal fibular fracture. Electronically Signed   By: Donavan Foil M.D.   On: 09/25/2017 18:08   Dg  Foot Complete Left  Result Date: 09/25/2017 CLINICAL DATA:  Fall.  Foot injury with pain. EXAM: LEFT FOOT - COMPLETE 3+ VIEW COMPARISON:  None. FINDINGS: Three views study shows diffuse bony demineralization. There is an acute fracture involving the head/neck of the second toe proximal phalanx, well demonstrated on the lateral projection. Degenerative changes are seen in the great toe MTP joint and there is a linear lucency in the base of the great toe proximal phalanx compatible with age indeterminate fracture. IMPRESSION: Acute fracture involving the neck of the second toe proximal phalanx with possible extension into the phalangeal head. Age-indeterminate fracture at the base of the great toe proximal phalanx with associated degenerative change. Electronically Signed   By: Misty Stanley M.D.   On: 09/25/2017 17:47    Procedures Procedures (including critical care time)  Medications Ordered in UC Medications - No data to display   Initial Impression / Assessment and Plan / UC Course  I have reviewed the triage vital signs and the nursing notes.  Pertinent labs & imaging results that were available during my care of the patient were reviewed by me and considered in my medical decision making (see chart for details).    Patient with significant swelling of the ankle, will obtain x-ray for further evaluation despite no tenderness on palpation.   Discussed  xray results with patient. Will apply cam walker, patient with crutches already. Non weightbearing until evaluated by orthopedics. mobic for pain. Ice compress, elevation. Follow up with orthopedics for further evaluation and management needed. Return precautions given.   Final Clinical Impressions(s) / UC Diagnoses   Final diagnoses:  Closed nondisplaced fracture of proximal phalanx of left great toe, initial encounter  Closed displaced fracture of proximal phalanx of lesser toe of left foot, initial encounter    ED Discharge Orders        Ordered    meloxicam (MOBIC) 7.5 MG tablet  Daily     09/25/17 1817        Ok Edwards, PA-C 09/25/17 1829

## 2017-09-25 NOTE — Discharge Instructions (Signed)
Fractures to the toe, xray also shows possible fracture to the ankle as well. Please wear cam walker, use crutches and try to not bear weight. Mobic for pain. Ice compress, elevation. Please call orthopedics tomorrow for follow up.

## 2017-09-25 NOTE — ED Triage Notes (Signed)
Pt states he slipped on some wet wood in his backyard on Sunday.  He twisted his left ankle and has pain in his ankle, lower leg, knee and great toe.

## 2017-10-08 ENCOUNTER — Telehealth (INDEPENDENT_AMBULATORY_CARE_PROVIDER_SITE_OTHER): Payer: Self-pay

## 2017-10-08 ENCOUNTER — Ambulatory Visit (INDEPENDENT_AMBULATORY_CARE_PROVIDER_SITE_OTHER): Payer: Medicare HMO | Admitting: Orthopedic Surgery

## 2017-10-08 ENCOUNTER — Encounter (INDEPENDENT_AMBULATORY_CARE_PROVIDER_SITE_OTHER): Payer: Self-pay | Admitting: Orthopedic Surgery

## 2017-10-08 ENCOUNTER — Ambulatory Visit (INDEPENDENT_AMBULATORY_CARE_PROVIDER_SITE_OTHER): Payer: Medicare HMO

## 2017-10-08 DIAGNOSIS — M25572 Pain in left ankle and joints of left foot: Secondary | ICD-10-CM

## 2017-10-08 DIAGNOSIS — S92592A Other fracture of left lesser toe(s), initial encounter for closed fracture: Secondary | ICD-10-CM

## 2017-10-08 DIAGNOSIS — S82899A Other fracture of unspecified lower leg, initial encounter for closed fracture: Secondary | ICD-10-CM

## 2017-10-08 DIAGNOSIS — S82892A Other fracture of left lower leg, initial encounter for closed fracture: Secondary | ICD-10-CM

## 2017-10-08 MED ORDER — ACETAMINOPHEN-CODEINE #3 300-30 MG PO TABS: ORAL_TABLET | ORAL | 0 refills | Status: DC

## 2017-10-08 NOTE — Progress Notes (Signed)
xr

## 2017-10-08 NOTE — Telephone Encounter (Signed)
I put in an order for urgent MRI scan. Patient has ankle fracture and Dr Marlou Sa would like this scan sooner rather than later.

## 2017-10-09 NOTE — Telephone Encounter (Signed)
noted 

## 2017-10-11 ENCOUNTER — Encounter (INDEPENDENT_AMBULATORY_CARE_PROVIDER_SITE_OTHER): Payer: Self-pay | Admitting: Orthopedic Surgery

## 2017-10-11 ENCOUNTER — Ambulatory Visit (HOSPITAL_BASED_OUTPATIENT_CLINIC_OR_DEPARTMENT_OTHER)
Admission: RE | Admit: 2017-10-11 | Discharge: 2017-10-11 | Disposition: A | Discharge status: Home / Self Care | Payer: Medicare HMO | Source: Ambulatory Visit | Attending: Orthopedic Surgery | Admitting: Orthopedic Surgery

## 2017-10-11 DIAGNOSIS — S82899A Other fracture of unspecified lower leg, initial encounter for closed fracture: Secondary | ICD-10-CM | POA: Diagnosis present

## 2017-10-11 DIAGNOSIS — S82842K Displaced bimalleolar fracture of left lower leg, subsequent encounter for closed fracture with nonunion: Secondary | ICD-10-CM | POA: Insufficient documentation

## 2017-10-11 DIAGNOSIS — X58XXXD Exposure to other specified factors, subsequent encounter: Secondary | ICD-10-CM | POA: Diagnosis not present

## 2017-10-11 DIAGNOSIS — R600 Localized edema: Secondary | ICD-10-CM | POA: Diagnosis not present

## 2017-10-11 DIAGNOSIS — M7989 Other specified soft tissue disorders: Secondary | ICD-10-CM | POA: Diagnosis not present

## 2017-10-11 NOTE — Progress Notes (Signed)
Office Visit Note   Patient: Jay Holloway           Date of Birth: 1946/03/01           MRN: [id number] Visit Date: 10/08/2017 Requested by: Anda Kraft, MD [address] [place], [place] [address] PCP: [name], MD  Subjective: Chief Complaint  Patient presents with  . Left Ankle - Pain  . Left Foot - Pain    HPI: Kashtyn is a patient who injured his left foot and ankle 09/23/2017 when he was trying to walk up a ramp.  He slipped and sustained a rotational injury to his leg.  It is raining at the time.  He went to the emergency room and was advised that he had a toe and ankle fracture.  He was given Mobic which did not help much.  Hard for him to sleep at night.  He has been fully weightbearing in a fracture boot with crutches.              ROS: All systems reviewed are negative as they relate to the chief complaint within the history of present illness.  Patient denies  fevers or chills.   Assessment & Plan: Visit Diagnoses:  1. Pain in left ankle and joints of left foot   2. Other fracture of unspecified lower leg, initial encounter for closed fracture     Plan: Impression is fourth toe proximal phalanx fracture which more concerning is the possibility of a syndesmotic injury.  He does have a nondisplaced medial malleolus fracture and a proximal fibular fracture which we discovered today.  There is some asymmetry of ankle stability left versus right.  Patient is diabetic and has enough ankle swelling that we need to be certain of the diagnosis regarding syndesmotic injury before proceeding with fixation.  Plan at this time is MRI scan of that left ankle to evaluate the syndesmosis.  Follow-up with me after that study.  Continue with fracture boot and limited weightbearing until that time.  Follow-Up Instructions: Return for after MRI.   Orders:  Orders Placed This Encounter  Procedures  . XR Ankle Complete Left  . XR Foot Complete Left  . XR Tibia/Fibula Left    . MR Ankle Left w/o contrast   Meds ordered this encounter  Medications  . acetaminophen-codeine (TYLENOL #3) 300-30 MG tablet    Sig: 1 po q 8 hrs prn pain    Dispense:  30 tablet    Refill:  0      Procedures: No procedures performed   Clinical Data: No additional findings.  Objective: Vital Signs: There were no vitals taken for this visit.  Physical Exam:   Constitutional: Patient appears well-developed HEENT:  Head: Normocephalic Eyes:EOM are normal Neck: Normal range of motion Cardiovascular: Normal rate Pulmonary/chest: Effort normal Neurologic: Patient is alert Skin: Skin is warm Psychiatric: Patient has normal mood and affect    Ortho Exam: Orthopedic exam demonstrates some swelling and tenderness around the ankle.  Slight increase in syndesmotic instability left versus right.  Patient has palpable intact nontender anterior to posterior to peroneal and Achilles tendons.  Compartments are soft.  Diminished sensation in both feet noted consistent with diabetic neuropathy.  Pedal pulses palpable.  Proximal fibular tenderness is present.  There is also some medial sided tenderness on the left ankle compared to the right.  Swelling is present in the left ankle compared to the right.  Specialty Comments:  No specialty comments available.  Imaging: Xr Ankle Complete Left  Result Date: 10/11/2017 AP lateral mortise left ankle reviewed.  Nondisplaced medial malleolar fracture is present.  Mortise is symmetric.  Nonweightbearing radiographs is performed.  No talar dome lesions.  Xr Foot Complete Left  Result Date: 10/11/2017 AP lateral oblique left foot reviewed.  Proximal phalanx fracture of the fourth toe is present.  Degenerative changes present in the midfoot.  No tarsometatarsal malalignment present.  Xr Tibia/fibula Left  Result Date: 10/11/2017 AP lateral left tib-fib reviewed.  Proximal fibular fracture is present.  Tibia intact.  No other bony  abnormalities in the tib-fib region    PMFS History: Patient Active Problem List   Diagnosis Date Noted  . Sepsis ([place]) 11/08/2014  . Arterial hypotension   . Renal failure   . Type 2 diabetes mellitus with ketoacidosis without coma ([place])   . Weakness   . Bacteremia   . Blood poisoning   . Urinary tract infectious disease   . DKA (diabetic ketoacidoses) ([place]) 11/05/2014   Past Medical History:  Diagnosis Date  . Arthritis    trouble turning head side to side and up  . Diabetes mellitus without complication ([place])   . Hemorrhoids   . High cholesterol   . Hypertension   . Prostate cancer [facility])     History reviewed. No pertinent family history.  Past Surgical History:  Procedure Laterality Date  . CHOLECYSTECTOMY N/A 11/11/2014   Procedure: LAPAROSCOPIC CHOLECYSTECTOMY WITH INTRAOPERATIVE CHOLANGIOGRAM;  Surgeon: Erroll Luna, MD;  Location: [place];  Service: General;  Laterality: N/A;  . COLONOSCOPY N/A 11/09/2014   Procedure: COLONOSCOPY;  Surgeon: Juanita Craver, MD;  Location: [facility] ENDOSCOPY;  Service: Endoscopy;  Laterality: N/A;  . CYSTOSCOPY WITH RETROGRADE PYELOGRAM, URETEROSCOPY AND STENT PLACEMENT Left 04/18/2014   Procedure: CYSTOSCOPY WITH RETROGRADE PYELOGRAM, URETEROSCOPY AND STENT PLACEMENT;  Surgeon: Raynelle Bring, MD;  Location: [facility];  Service: Urology;  Laterality: Left;  . HOLMIUM LASER APPLICATION Left 0/62/6948   Procedure: HOLMIUM LASER APPLICATION;  Surgeon: Raynelle Bring, MD;  Location: [facility];  Service: Urology;  Laterality: Left;  . LYMPHADENECTOMY  06/04/2012   Procedure: LYMPHADENECTOMY;  Surgeon: Dutch Gray, MD;  Location: [facility];  Service: Urology;  Laterality: Bilateral;  . right  achilles tendon repair  yrs ago  . ROBOT ASSISTED LAPAROSCOPIC RADICAL PROSTATECTOMY  06/04/2012   Procedure: ROBOTIC ASSISTED LAPAROSCOPIC RADICAL PROSTATECTOMY LEVEL 3;  Surgeon: Dutch Gray, MD;  Location[facility];  Service: Urology;  Laterality: N/A;   Social History    Occupational History  . Not on file  Tobacco Use  . Smoking status: Former Smoker    Packs/day: 0.25    Years: 4.00    Pack years: 1.00    Types: Cigarettes    Last attempt to quit: 12/16/1978    Years since quitting: 38.8  . Smokeless tobacco: Never Used  Substance and Sexual Activity  . Alcohol use: No  . Drug use: No  . Sexual activity: Not on file

## 2017-10-20 ENCOUNTER — Encounter (INDEPENDENT_AMBULATORY_CARE_PROVIDER_SITE_OTHER): Payer: Self-pay | Admitting: Orthopedic Surgery

## 2017-10-20 ENCOUNTER — Ambulatory Visit (INDEPENDENT_AMBULATORY_CARE_PROVIDER_SITE_OTHER): Payer: Medicare HMO | Admitting: Orthopedic Surgery

## 2017-10-20 DIAGNOSIS — M25572 Pain in left ankle and joints of left foot: Secondary | ICD-10-CM

## 2017-10-20 MED ORDER — ACETAMINOPHEN-CODEINE #3 300-30 MG PO TABS: ORAL_TABLET | ORAL | 0 refills | Status: DC

## 2017-10-20 NOTE — Progress Notes (Signed)
Office Visit Note   Patient: Jay Holloway           Date of Birth: [birth year]           MRN: [id number] Visit Date: 10/20/2017 Requested by: [name], MD [address] [place], [place] [address] PCP: [name], MD  Subjective: Chief Complaint  Patient presents with  . Left Ankle - Follow-up    HPI: Jonavan is a patient with left ankle injury.  Also concerned about syndesmotic injury.  MRI scan shows old chronic avulsion type fractures medial and lateral malleolus.  Syndesmosis appears intact.              ROS: All systems reviewed are negative as they relate to the chief complaint within the history of present illness.  Patient denies  fevers or chills.   Assessment & Plan: Visit Diagnoses:  1. Pain in left ankle and joints of left foot     Plan: Impression is soft tissue swelling about the left ankle.  He is likely had ligamentous and soft tissue injury but no fracture and no injury to the syndesmosis.  Patient does have a proximal fibular fracture.  This likely does not represent injury to the syndesmosis based on MRI scan findings.  There is no posterior malleolar fracture.  Plan is fracture boot for 2 weeks then weightbearing as tolerated out of the fracture boot.  I will see him back in 4 weeks for clinical recheck.  Tylenol 3 #31 every 12 hours as needed for pain prescribed.  That should not be refilled.  Follow-Up Instructions: Return in about 1 month (around 11/17/2017).   Orders:  No orders of the defined types were placed in this encounter.  Meds ordered this encounter  Medications  . acetaminophen-codeine (TYLENOL #3) 300-30 MG tablet    Sig: 1 po q 12 hrs prn pain    Dispense:  30 tablet    Refill:  0      Procedures: No procedures performed   Clinical Data: No additional findings.  Objective: Vital Signs: There were no vitals taken for this visit.  Physical Exam:   Constitutional: Patient appears well-developed HEENT:  Head:  Normocephalic Eyes:EOM are normal Neck: Normal range of motion Cardiovascular: Normal rate Pulmonary/chest: Effort normal Neurologic: Patient is alert Skin: Skin is warm Psychiatric: Patient has normal mood and affect    Ortho Exam: Orthopedic exam demonstrates some soft tissue swelling around the distal aspect of the left foot.  Compartments are soft.  Some pitting edema present bilaterally.  Ankle dorsiflexion plantarflexion intact.  Syndesmotic feels stable to stress.  Foot is perfused.  There is no evidence of infection or any type of fracture blisters.  Specialty Comments:  No specialty comments available.  Imaging: No results found.   PMFS History: Patient Active Problem List   Diagnosis Date Noted  . Sepsis ([place]) 11/08/2014  . Arterial hypotension   . Renal failure   . Type 2 diabetes mellitus with ketoacidosis without coma ([place])   . Weakness   . Bacteremia   . Blood poisoning   . Urinary tract infectious disease   . DKA (diabetic ketoacidoses) ([place]) 11/05/2014   Past Medical History:  Diagnosis Date  . Arthritis    trouble turning head side to side and up  . Diabetes mellitus without complication ([place])   . Hemorrhoids   . High cholesterol   . Hypertension   . Prostate cancer [facility])     History reviewed. No pertinent  family history.  Past Surgical History:  Procedure Laterality Date  . CHOLECYSTECTOMY N/A 11/11/2014   Procedure: LAPAROSCOPIC CHOLECYSTECTOMY WITH INTRAOPERATIVE CHOLANGIOGRAM;  Surgeon: Erroll Luna, MD;  Location: [place];  Service: General;  Laterality: N/A;  . COLONOSCOPY N/A 11/09/2014   Procedure: COLONOSCOPY;  Surgeon: Juanita Craver, MD;  Location: [facility] ENDOSCOPY;  Service: Endoscopy;  Laterality: N/A;  . CYSTOSCOPY WITH RETROGRADE PYELOGRAM, URETEROSCOPY AND STENT PLACEMENT Left 04/18/2014   Procedure: CYSTOSCOPY WITH RETROGRADE PYELOGRAM, URETEROSCOPY AND STENT PLACEMENT;  Surgeon: Raynelle Bring, MD;  [facility];  Service: Urology;   Laterality: Left;  . HOLMIUM LASER APPLICATION Left 9/97/7414   Procedure: HOLMIUM LASER APPLICATION;  Surgeon: Raynelle Bring, MD;  [facility];  Service: Urology;  Laterality: Left;  . LYMPHADENECTOMY  06/04/2012   Procedure: LYMPHADENECTOMY;  Surgeon: Dutch Gray, MD;  [facility];  Service: Urology;  Laterality: Bilateral;  . right  achilles tendon repair  yrs ago  . ROBOT ASSISTED LAPAROSCOPIC RADICAL PROSTATECTOMY  06/04/2012   Procedure: ROBOTIC ASSISTED LAPAROSCOPIC RADICAL PROSTATECTOMY LEVEL 3;  Surgeon: Dutch Gray, MD;  [facility];  Service: Urology;  Laterality: N/A;   Social History   Occupational History  . Not on file  Tobacco Use  . Smoking status: Former Smoker    Packs/day: 0.25    Years: 4.00    Pack years: 1.00    Types: Cigarettes    Last attempt to quit: 12/16/1978    Years since quitting: 38.8  . Smokeless tobacco: Never Used  Substance and Sexual Activity  . Alcohol use: No  . Drug use: No  . Sexual activity: Not on file

## 2017-10-20 NOTE — Progress Notes (Signed)
t

## 2017-11-24 ENCOUNTER — Ambulatory Visit (INDEPENDENT_AMBULATORY_CARE_PROVIDER_SITE_OTHER): Payer: Medicare HMO

## 2017-11-24 ENCOUNTER — Ambulatory Visit (INDEPENDENT_AMBULATORY_CARE_PROVIDER_SITE_OTHER): Payer: Medicare HMO | Admitting: Orthopedic Surgery

## 2017-11-24 ENCOUNTER — Encounter (INDEPENDENT_AMBULATORY_CARE_PROVIDER_SITE_OTHER): Payer: Self-pay | Admitting: Orthopedic Surgery

## 2017-11-24 DIAGNOSIS — M25572 Pain in left ankle and joints of left foot: Secondary | ICD-10-CM

## 2017-11-24 NOTE — Progress Notes (Signed)
Post-Op Visit Note   Patient: Jay Holloway           Date of Birth: July 20, 1946           MRN: [id number] Visit Date: 11/24/2017 PCP: [name], MD   Assessment & Plan:  Chief Complaint:  Chief Complaint  Patient presents with  . Left Leg - Follow-up  . Left Ankle - Follow-up   Visit Diagnoses:  1. Pain in left ankle and joints of left foot     Plan: Dejour is a patient who is now a month out from left ankle injury.  He has been doing well.  He is 2 months out from injury.  Is been walking weightbearing as tolerated in fracture boot.  On examination he has no real tenderness around the proximal fibular region.  No tenderness around the medial or lateral malleolus.  Syndesmosis feels stable to medial and lateral shift testing.  Ankle dorsiflexion plantarflexion intact.  Impression is left ankle injury now healing well.  No calf tenderness today.  Callus formation on radiographs.  Plan is to transition out of the fracture boot into regular shoes.  Weightbearing as tolerated.  Follow-up with me as needed.  Follow-Up Instructions: No follow-ups on file.   Orders:  Orders Placed This Encounter  Procedures  . XR Ankle Complete Left  . XR Tibia/Fibula Left   No orders of the defined types were placed in this encounter.   Imaging: Xr Ankle Complete Left  Result Date: 11/24/2017 AP lateral mortise left ankle reviewed.  Syndesmosis symmetric.  Ossicle formation around the medial malleolus is again noted.  No acute fracture present.  Moderate degenerative changes noted in the midfoot and subtalar regions  Xr Tibia/fibula Left  Result Date: 11/24/2017 AP lateral left tib-fib reviewed.  Callus formation around proximal fibular fracture seen.  Syndesmosis appears symmetric on the distal portion of the exam.  No acute tibial fracture.   PMFS History: Patient Active Problem List   Diagnosis Date Noted  . Sepsis ([place]) 11/08/2014  . Arterial hypotension   . Renal failure   . Type  2 diabetes mellitus with ketoacidosis without coma ([place])   . Weakness   . Bacteremia   . Blood poisoning   . Urinary tract infectious disease   . DKA (diabetic ketoacidoses) ([place]) 11/05/2014   Past Medical History:  Diagnosis Date  . Arthritis    trouble turning head side to side and up  . Diabetes mellitus without complication ([place])   . Hemorrhoids   . High cholesterol   . Hypertension   . Prostate cancer [facility])     History reviewed. No pertinent family history.  Past Surgical History:  Procedure Laterality Date  . CHOLECYSTECTOMY N/A 11/11/2014   Procedure: LAPAROSCOPIC CHOLECYSTECTOMY WITH INTRAOPERATIVE CHOLANGIOGRAM;  Surgeon: Erroll Luna, MD;  Location: [place];  Service: General;  Laterality: N/A;  . COLONOSCOPY N/A 11/09/2014   Procedure: COLONOSCOPY;  Surgeon: Juanita Craver, MD;  Location: [facility] ENDOSCOPY;  Service: Endoscopy;  Laterality: N/A;  . CYSTOSCOPY WITH RETROGRADE PYELOGRAM, URETEROSCOPY AND STENT PLACEMENT Left 04/18/2014   Procedure: CYSTOSCOPY WITH RETROGRADE PYELOGRAM, URETEROSCOPY AND STENT PLACEMENT;  Surgeon: Raynelle Bring, MD;  [facility];  Service: Urology;  Laterality: Left;  . HOLMIUM LASER APPLICATION Left 6/50/3546   Procedure: HOLMIUM LASER APPLICATION;  Surgeon: Raynelle Bring, MD;  [facility];  Service: Urology;  Laterality: Left;  . LYMPHADENECTOMY  06/04/2012   Procedure: LYMPHADENECTOMY;  Surgeon: Dutch Gray, MD;  [facility];  Service: Urology;  Laterality: Bilateral;  . right  achilles tendon repair  yrs ago  . ROBOT ASSISTED LAPAROSCOPIC RADICAL PROSTATECTOMY  06/04/2012   Procedure: ROBOTIC ASSISTED LAPAROSCOPIC RADICAL PROSTATECTOMY LEVEL 3;  Surgeon: Dutch Gray, MD;  [facility];  Service: Urology;  Laterality: N/A;   Social History   Occupational History  . Not on file  Tobacco Use  . Smoking status: Former Smoker    Packs/day: 0.25    Years: 4.00    Pack years: 1.00    Types: Cigarettes    Last attempt to quit:  12/16/1978    Years since quitting: 38.9  . Smokeless tobacco: Never Used  Substance and Sexual Activity  . Alcohol use: No  . Drug use: No  . Sexual activity: Not on file

## 2018-02-02 DIAGNOSIS — Z8546 Personal history of malignant neoplasm of prostate: Secondary | ICD-10-CM | POA: Diagnosis not present

## 2018-02-11 DIAGNOSIS — Z8546 Personal history of malignant neoplasm of prostate: Secondary | ICD-10-CM | POA: Diagnosis not present

## 2018-04-29 DIAGNOSIS — E118 Type 2 diabetes mellitus with unspecified complications: Secondary | ICD-10-CM | POA: Diagnosis not present

## 2018-05-06 DIAGNOSIS — E789 Disorder of lipoprotein metabolism, unspecified: Secondary | ICD-10-CM | POA: Diagnosis not present

## 2018-05-06 DIAGNOSIS — E118 Type 2 diabetes mellitus with unspecified complications: Secondary | ICD-10-CM | POA: Diagnosis not present

## 2018-05-06 DIAGNOSIS — I10 Essential (primary) hypertension: Secondary | ICD-10-CM | POA: Diagnosis not present

## 2018-05-20 ENCOUNTER — Ambulatory Visit: Payer: Medicare HMO | Admitting: Podiatry

## 2018-05-20 ENCOUNTER — Other Ambulatory Visit: Payer: Self-pay

## 2018-05-20 DIAGNOSIS — B351 Tinea unguium: Secondary | ICD-10-CM | POA: Diagnosis not present

## 2018-05-20 DIAGNOSIS — M79675 Pain in left toe(s): Secondary | ICD-10-CM | POA: Diagnosis not present

## 2018-05-20 DIAGNOSIS — M79674 Pain in right toe(s): Secondary | ICD-10-CM

## 2018-05-20 DIAGNOSIS — Z794 Long term (current) use of insulin: Secondary | ICD-10-CM

## 2018-05-20 DIAGNOSIS — Z8546 Personal history of malignant neoplasm of prostate: Secondary | ICD-10-CM | POA: Diagnosis not present

## 2018-05-20 DIAGNOSIS — E119 Type 2 diabetes mellitus without complications: Secondary | ICD-10-CM | POA: Diagnosis not present

## 2018-06-01 ENCOUNTER — Encounter: Payer: Self-pay | Admitting: Podiatry

## 2018-06-01 NOTE — Progress Notes (Signed)
Subjective: "These toenails are getting long and painful. I'm diabetic." Jay Holloway presents today for follow up of painful, mycotic toenails. He is diabetic and afraid to trim them himself.  Pain is aggravated when wearing enclosed shoe gear. His symptoms have resolved with periodic professional debridement in the past.  Objective: Vascular Examination: Capillary refill time immediate x 10 digits Dorsalis pedis and posterior tibial pulses present b/l No digital hair x 10 digits Skin temperature warm to warm b/l Mild edema of ankles b/l No ischemia noted b/l  Dermatological Examination: Skin with normal turgor, texture and tone b/l Toenails 1-5 b/l discolored, thick, dystrophic with subungual debris and pain with palpation to nailbeds due to thickness of nails. No open wounds No interdigital macerations No hyperkeratotic lesions  Musculoskeletal: Muscle strength 5/5 to all LE muscle groups  Neurological: Sensation intact with 10 gram monofilament. Vibratory sensation intact.  Assessment: Painful onychomycosis toenails 1-5 b/l  NIDDM on long term insulin without complication  Plan: 1. Toenails 1-5 b/l were debrided in length and girth without iatrogenic bleeding. 2. Patient to continue soft, supportive shoe gear 3. Patient to report any pedal injuries to medical professional immediately. 4. Follow up 3 months. Patient/POA to call should there be a concern in the interim.

## 2018-08-04 DIAGNOSIS — E118 Type 2 diabetes mellitus with unspecified complications: Secondary | ICD-10-CM | POA: Diagnosis not present

## 2018-08-04 DIAGNOSIS — E789 Disorder of lipoprotein metabolism, unspecified: Secondary | ICD-10-CM | POA: Diagnosis not present

## 2018-08-06 DIAGNOSIS — M79675 Pain in left toe(s): Secondary | ICD-10-CM | POA: Diagnosis not present

## 2018-08-06 DIAGNOSIS — M79605 Pain in left leg: Secondary | ICD-10-CM | POA: Diagnosis not present

## 2018-08-06 DIAGNOSIS — S8992XD Unspecified injury of left lower leg, subsequent encounter: Secondary | ICD-10-CM | POA: Diagnosis not present

## 2018-08-06 DIAGNOSIS — I10 Essential (primary) hypertension: Secondary | ICD-10-CM | POA: Diagnosis not present

## 2018-08-06 DIAGNOSIS — E118 Type 2 diabetes mellitus with unspecified complications: Secondary | ICD-10-CM | POA: Diagnosis not present

## 2018-08-06 DIAGNOSIS — E789 Disorder of lipoprotein metabolism, unspecified: Secondary | ICD-10-CM | POA: Diagnosis not present

## 2018-08-12 DIAGNOSIS — M79605 Pain in left leg: Secondary | ICD-10-CM | POA: Diagnosis not present

## 2018-08-12 DIAGNOSIS — M129 Arthropathy, unspecified: Secondary | ICD-10-CM | POA: Diagnosis not present

## 2018-08-12 DIAGNOSIS — E559 Vitamin D deficiency, unspecified: Secondary | ICD-10-CM | POA: Diagnosis not present

## 2018-08-12 DIAGNOSIS — G894 Chronic pain syndrome: Secondary | ICD-10-CM | POA: Diagnosis not present

## 2018-08-12 DIAGNOSIS — Z79899 Other long term (current) drug therapy: Secondary | ICD-10-CM | POA: Diagnosis not present

## 2018-08-14 DIAGNOSIS — Z8546 Personal history of malignant neoplasm of prostate: Secondary | ICD-10-CM | POA: Diagnosis not present

## 2018-08-21 ENCOUNTER — Ambulatory Visit: Payer: Medicare HMO | Admitting: Podiatry

## 2018-08-21 DIAGNOSIS — Z8546 Personal history of malignant neoplasm of prostate: Secondary | ICD-10-CM | POA: Diagnosis not present

## 2018-08-26 DIAGNOSIS — M79605 Pain in left leg: Secondary | ICD-10-CM | POA: Diagnosis not present

## 2018-08-26 DIAGNOSIS — Z79899 Other long term (current) drug therapy: Secondary | ICD-10-CM | POA: Diagnosis not present

## 2018-08-26 DIAGNOSIS — E559 Vitamin D deficiency, unspecified: Secondary | ICD-10-CM | POA: Diagnosis not present

## 2018-08-26 DIAGNOSIS — G894 Chronic pain syndrome: Secondary | ICD-10-CM | POA: Diagnosis not present

## 2018-09-08 DIAGNOSIS — I10 Essential (primary) hypertension: Secondary | ICD-10-CM | POA: Diagnosis not present

## 2018-09-08 DIAGNOSIS — E789 Disorder of lipoprotein metabolism, unspecified: Secondary | ICD-10-CM | POA: Diagnosis not present

## 2018-09-08 DIAGNOSIS — Z7901 Long term (current) use of anticoagulants: Secondary | ICD-10-CM | POA: Diagnosis not present

## 2018-09-08 DIAGNOSIS — E118 Type 2 diabetes mellitus with unspecified complications: Secondary | ICD-10-CM | POA: Diagnosis not present

## 2018-09-15 DIAGNOSIS — I10 Essential (primary) hypertension: Secondary | ICD-10-CM | POA: Diagnosis not present

## 2018-09-15 DIAGNOSIS — E118 Type 2 diabetes mellitus with unspecified complications: Secondary | ICD-10-CM | POA: Diagnosis not present

## 2018-09-15 DIAGNOSIS — E789 Disorder of lipoprotein metabolism, unspecified: Secondary | ICD-10-CM | POA: Diagnosis not present

## 2018-09-16 ENCOUNTER — Encounter: Payer: Self-pay | Admitting: Podiatry

## 2018-09-16 ENCOUNTER — Ambulatory Visit: Payer: Medicare HMO | Admitting: Podiatry

## 2018-09-16 DIAGNOSIS — B351 Tinea unguium: Secondary | ICD-10-CM

## 2018-09-16 DIAGNOSIS — Z794 Long term (current) use of insulin: Secondary | ICD-10-CM | POA: Diagnosis not present

## 2018-09-16 DIAGNOSIS — M79675 Pain in left toe(s): Secondary | ICD-10-CM

## 2018-09-16 DIAGNOSIS — E119 Type 2 diabetes mellitus without complications: Secondary | ICD-10-CM | POA: Diagnosis not present

## 2018-09-16 DIAGNOSIS — M79674 Pain in right toe(s): Secondary | ICD-10-CM | POA: Diagnosis not present

## 2018-09-16 NOTE — Patient Instructions (Signed)

## 2018-09-22 DIAGNOSIS — E118 Type 2 diabetes mellitus with unspecified complications: Secondary | ICD-10-CM | POA: Diagnosis not present

## 2018-09-22 DIAGNOSIS — E789 Disorder of lipoprotein metabolism, unspecified: Secondary | ICD-10-CM | POA: Diagnosis not present

## 2018-09-22 DIAGNOSIS — I10 Essential (primary) hypertension: Secondary | ICD-10-CM | POA: Diagnosis not present

## 2018-09-23 DIAGNOSIS — G894 Chronic pain syndrome: Secondary | ICD-10-CM | POA: Diagnosis not present

## 2018-09-23 DIAGNOSIS — M79605 Pain in left leg: Secondary | ICD-10-CM | POA: Diagnosis not present

## 2018-09-23 DIAGNOSIS — Z79899 Other long term (current) drug therapy: Secondary | ICD-10-CM | POA: Diagnosis not present

## 2018-09-24 NOTE — Progress Notes (Signed)
Subjective: Jay Holloway presents today with painful, thick toenails 1-5 b/l that he cannot cut and which interfere with daily activities.  Pain is aggravated when wearing enclosed shoe gear.  Blood sugar this am was 180 mg/dl. Last A1c was 7.1.  [name], MD is his PCP.   Current Outpatient Medications:  .  acetaminophen-codeine (TYLENOL #3) 300-30 MG tablet, 1 po q 8 hrs prn pain, Disp: 30 tablet, Rfl: 0 .  acetaminophen-codeine (TYLENOL #3) 300-30 MG tablet, 1 po q 12 hrs prn pain, Disp: 30 tablet, Rfl: 0 .  amLODipine (NORVASC) 10 MG tablet, Take 10 mg by mouth daily., Disp: , Rfl:  .  atorvastatin (LIPITOR) 10 MG tablet, , Disp: , Rfl:  .  BD PEN NEEDLE NANO U/F 32G X 4 MM MISC, , Disp: , Rfl: 0 .  insulin glargine (LANTUS) 100 UNIT/ML injection, Inject 35 Units into the skin at bedtime., Disp: , Rfl:  .  insulin lispro (HUMALOG) 100 UNIT/ML injection, Inject 6 Units into the skin 3 (three) times daily before meals., Disp: , Rfl:  .  irbesartan (AVAPRO) 300 MG tablet, Take 300 mg by mouth daily., Disp: , Rfl: 1 .  meloxicam (MOBIC) 7.5 MG tablet, Take 1 tablet (7.5 mg total) by mouth daily., Disp: 15 tablet, Rfl: 0 .  NAPROXEN DR 500 MG EC tablet, , Disp: , Rfl:  .  NOVOLOG FLEXPEN 100 UNIT/ML FlexPen, , Disp: , Rfl:  .  rosuvastatin (CRESTOR) 10 MG tablet, Take 10 mg by mouth every morning., Disp: , Rfl:   No Known Allergies  Objective:  Vascular Examination: Capillary refill time immediate x 10 digits  Dorsalis pedis and Posterior tibial pulses palpable b/l  No digital hair  x 10 digits  Skin temperature gradient WNL b/l  Mild ankle edema b/l. No warmth, no erythema.  Dermatological Examination: Skin with normal turgor, texture and tone b/l  Toenails 1-5 b/l discolored, thick, dystrophic with subungual debris and pain with palpation to nailbeds due to thickness of nails.  Musculoskeletal: Muscle strength 5/5 to all LE muscle groups  No pain, crepitus or joint  limitation noted with ROM.   Neurological: Sensation intact with 10 gram monofilament.  Vibratory sensation intact.  Assessment: Painful onychomycosis toenails 1-5 b/l  NIDDM without complication with long-term use of insulin  Plan: 1. Toenails 1-5 b/l were debrided in length and girth without iatrogenic bleeding. 2. Patient to continue soft, supportive shoe gear 3. Patient to report any pedal injuries to medical professional immediately. 4. Follow up 3 months.  5. Patient/POA to call should there be a concern in the interim.

## 2018-11-03 DIAGNOSIS — E789 Disorder of lipoprotein metabolism, unspecified: Secondary | ICD-10-CM | POA: Diagnosis not present

## 2018-11-03 DIAGNOSIS — E1165 Type 2 diabetes mellitus with hyperglycemia: Secondary | ICD-10-CM | POA: Diagnosis not present

## 2018-11-03 DIAGNOSIS — I10 Essential (primary) hypertension: Secondary | ICD-10-CM | POA: Diagnosis not present

## 2018-11-19 DIAGNOSIS — Z79899 Other long term (current) drug therapy: Secondary | ICD-10-CM | POA: Diagnosis not present

## 2018-11-19 DIAGNOSIS — G894 Chronic pain syndrome: Secondary | ICD-10-CM | POA: Diagnosis not present

## 2018-11-19 DIAGNOSIS — M79605 Pain in left leg: Secondary | ICD-10-CM | POA: Diagnosis not present

## 2018-12-16 ENCOUNTER — Other Ambulatory Visit: Payer: Self-pay

## 2018-12-16 ENCOUNTER — Ambulatory Visit: Payer: Medicare HMO | Admitting: Podiatry

## 2018-12-16 ENCOUNTER — Encounter: Payer: Self-pay | Admitting: Podiatry

## 2018-12-16 VITALS — Temp 97.0°F

## 2018-12-16 DIAGNOSIS — M79675 Pain in left toe(s): Secondary | ICD-10-CM | POA: Diagnosis not present

## 2018-12-16 DIAGNOSIS — B351 Tinea unguium: Secondary | ICD-10-CM | POA: Diagnosis not present

## 2018-12-16 DIAGNOSIS — M79674 Pain in right toe(s): Secondary | ICD-10-CM

## 2018-12-27 ENCOUNTER — Encounter: Payer: Self-pay | Admitting: Podiatry

## 2018-12-27 NOTE — Progress Notes (Signed)
Subjective: Jay Holloway presents today with painful, thick toenails 1-5 b/l that he cannot cut and which interfere with daily activities.  Pain is aggravated when wearing enclosed shoe gear.  [name], MD is his PCP.   He voices no new pedal concerns on today's visit.   Current Outpatient Medications:  .  acetaminophen-codeine (TYLENOL #3) 300-30 MG tablet, 1 po q 8 hrs prn pain, Disp: 30 tablet, Rfl: 0 .  acetaminophen-codeine (TYLENOL #3) 300-30 MG tablet, 1 po q 12 hrs prn pain, Disp: 30 tablet, Rfl: 0 .  amLODipine (NORVASC) 10 MG tablet, Take 10 mg by mouth daily., Disp: , Rfl:  .  atorvastatin (LIPITOR) 10 MG tablet, , Disp: , Rfl:  .  BD PEN NEEDLE NANO U/F 32G X 4 MM MISC, , Disp: , Rfl: 0 .  insulin glargine (LANTUS) 100 UNIT/ML injection, Inject 35 Units into the skin at bedtime., Disp: , Rfl:  .  insulin lispro (HUMALOG) 100 UNIT/ML injection, Inject 6 Units into the skin 3 (three) times daily before meals., Disp: , Rfl:  .  irbesartan (AVAPRO) 300 MG tablet, Take 300 mg by mouth daily., Disp: , Rfl: 1 .  meloxicam (MOBIC) 7.5 MG tablet, Take 1 tablet (7.5 mg total) by mouth daily., Disp: 15 tablet, Rfl: 0 .  NAPROXEN DR 500 MG EC tablet, , Disp: , Rfl:  .  NOVOLOG FLEXPEN 100 UNIT/ML FlexPen, , Disp: , Rfl:  .  rosuvastatin (CRESTOR) 10 MG tablet, Take 10 mg by mouth every morning., Disp: , Rfl:   No Known Allergies  Objective: Vitals:   12/16/18 1513  Temp: (!) 97 F (36.1 C)    Vascular Examination: Capillary refill time immediate x 10 digits.  Dorsalis pedis and Posterior tibial pulses palpable b/l.  Digital hair absent x 10 digits.  Skin temperature gradient WNL b/l.  Dermatological Examination: Skin with normal turgor, texture and tone b/l.  Toenails 1-5 b/l discolored, thick, dystrophic with subungual debris and pain with palpation to nailbeds due to thickness of nails.  Musculoskeletal: Muscle strength 5/5 to all LE muscle groups.  No pain,  crepitus or joint limitation noted with ROM.   Neurological: Sensation intact with 10 gram monofilament.  Vibratory sensation intact.  Assessment: Painful onychomycosis toenails 1-5 b/l  NIDDM   Plan: 1. Toenails 1-5 b/l were debrided in length and girth without iatrogenic bleeding. 2. Patient to continue soft, supportive shoe gear daily. 3. Patient to report any pedal injuries to medical professional immediately. 4. Follow up 3 months.  5. Patient/POA to call should there be a concern in the interim.

## 2019-03-17 ENCOUNTER — Encounter: Payer: Self-pay | Admitting: Podiatry

## 2019-03-17 ENCOUNTER — Other Ambulatory Visit: Payer: Self-pay

## 2019-03-17 ENCOUNTER — Ambulatory Visit: Payer: Medicare HMO | Admitting: Podiatry

## 2019-03-17 VITALS — Temp 97.0°F

## 2019-03-17 DIAGNOSIS — E119 Type 2 diabetes mellitus without complications: Secondary | ICD-10-CM

## 2019-03-17 DIAGNOSIS — M79675 Pain in left toe(s): Secondary | ICD-10-CM | POA: Diagnosis not present

## 2019-03-17 DIAGNOSIS — Z794 Long term (current) use of insulin: Secondary | ICD-10-CM

## 2019-03-17 DIAGNOSIS — B351 Tinea unguium: Secondary | ICD-10-CM | POA: Diagnosis not present

## 2019-03-17 DIAGNOSIS — M79674 Pain in right toe(s): Secondary | ICD-10-CM

## 2019-03-17 NOTE — Patient Instructions (Signed)
Diabetes Mellitus and Foot Care Foot care is an important part of your health, especially when you have diabetes. Diabetes may cause you to have problems because of poor blood flow (circulation) to your feet and legs, which can cause your skin to:  Become thinner and drier.  Break more easily.  Heal more slowly.  Peel and crack. You may also have nerve damage (neuropathy) in your legs and feet, causing decreased feeling in them. This means that you may not notice minor injuries to your feet that could lead to more serious problems. Noticing and addressing any potential problems early is the best way to prevent future foot problems. How to care for your feet Foot hygiene  Wash your feet daily with warm water and mild soap. Do not use hot water. Then, pat your feet and the areas between your toes until they are completely dry. Do not soak your feet as this can dry your skin.  Trim your toenails straight across. Do not dig under them or around the cuticle. File the edges of your nails with an emery board or nail file.  Apply a moisturizing lotion or petroleum jelly to the skin on your feet and to dry, brittle toenails. Use lotion that does not contain alcohol and is unscented. Do not apply lotion between your toes. Shoes and socks  Wear clean socks or stockings every day. Make sure they are not too tight. Do not wear knee-high stockings since they may decrease blood flow to your legs.  Wear shoes that fit properly and have enough cushioning. Always look in your shoes before you put them on to be sure there are no objects inside.  To break in new shoes, wear them for just a few hours a day. This prevents injuries on your feet. Wounds, scrapes, corns, and calluses  Check your feet daily for blisters, cuts, bruises, sores, and redness. If you cannot see the bottom of your feet, use a mirror or ask someone for help.  Do not cut corns or calluses or try to remove them with medicine.  If you  find a minor scrape, cut, or break in the skin on your feet, keep it and the skin around it clean and dry. You may clean these areas with mild soap and water. Do not clean the area with peroxide, alcohol, or iodine.  If you have a wound, scrape, corn, or callus on your foot, look at it several times a day to make sure it is healing and not infected. Check for: ? Redness, swelling, or pain. ? Fluid or blood. ? Warmth. ? Pus or a bad smell. General instructions  Do not cross your legs. This may decrease blood flow to your feet.  Do not use heating pads or hot water bottles on your feet. They may burn your skin. If you have lost feeling in your feet or legs, you may not know this is happening until it is too late.  Protect your feet from hot and cold by wearing shoes, such as at the beach or on hot pavement.  Schedule a complete foot exam at least once a year (annually) or more often if you have foot problems. If you have foot problems, report any cuts, sores, or bruises to your health care provider immediately. Contact a health care provider if:  You have a medical condition that increases your risk of infection and you have any cuts, sores, or bruises on your feet.  You have an injury that is not   healing.  You have redness on your legs or feet.  You feel burning or tingling in your legs or feet.  You have pain or cramps in your legs and feet.  Your legs or feet are numb.  Your feet always feel cold.  You have pain around a toenail. Get help right away if:  You have a wound, scrape, corn, or callus on your foot and: ? You have pain, swelling, or redness that gets worse. ? You have fluid or blood coming from the wound, scrape, corn, or callus. ? Your wound, scrape, corn, or callus feels warm to the touch. ? You have pus or a bad smell coming from the wound, scrape, corn, or callus. ? You have a fever. ? You have a red line going up your leg. Summary  Check your feet every day  for cuts, sores, red spots, swelling, and blisters.  Moisturize feet and legs daily.  Wear shoes that fit properly and have enough cushioning.  If you have foot problems, report any cuts, sores, or bruises to your health care provider immediately.  Schedule a complete foot exam at least once a year (annually) or more often if you have foot problems. This information is not intended to replace advice given to you by your health care provider. Make sure you discuss any questions you have with your health care provider. Document Released: 07/12/2000 Document Revised: 08/27/2017 Document Reviewed: 08/16/2016 Elsevier Patient Education  2020 [employer]   Onychomycosis/Fungal Toenails  WHAT IS IT? An infection that lies within the keratin of your nail plate that is caused by a fungus.  WHY ME? Fungal infections affect all ages, sexes, races, and creeds.  There may be many factors that predispose you to a fungal infection such as age, coexisting medical conditions such as diabetes, or an autoimmune disease; stress, medications, fatigue, genetics, etc.  Bottom line: fungus thrives in a warm, moist environment and your shoes offer such a location.  IS IT CONTAGIOUS? Theoretically, yes.  You do not want to share shoes, nail clippers or files with someone who has fungal toenails.  Walking around barefoot in the same room or sleeping in the same bed is unlikely to transfer the organism.  It is important to realize, however, that fungus can spread easily from one nail to the next on the same foot.  HOW DO WE TREAT THIS?  There are several ways to treat this condition.  Treatment may depend on many factors such as age, medications, pregnancy, liver and kidney conditions, etc.  It is best to ask your doctor which options are available to you.  1. No treatment.   Unlike many other medical concerns, you can live with this condition.  However for many people this can be a painful condition and may lead to  ingrown toenails or a bacterial infection.  It is recommended that you keep the nails cut short to help reduce the amount of fungal nail. 2. Topical treatment.  These range from herbal remedies to prescription strength nail lacquers.  About 40-50% effective, topicals require twice daily application for approximately 9 to 12 months or until an entirely new nail has grown out.  The most effective topicals are medical grade medications available through physicians offices. 3. Oral antifungal medications.  With an 80-90% cure rate, the most common oral medication requires 3 to 4 months of therapy and stays in your system for a year as the new nail grows out.  Oral antifungal medications do require   blood work to make sure it is a safe drug for you.  A liver function panel will be performed prior to starting the medication and after the first month of treatment.  It is important to have the blood work performed to avoid any harmful side effects.  In general, this medication safe but blood work is required. 4. Laser Therapy.  This treatment is performed by applying a specialized laser to the affected nail plate.  This therapy is noninvasive, fast, and non-painful.  It is not covered by insurance and is therefore, out of pocket.  The results have been very good with a 80-95% cure rate.  [facility] is the only practice in the area to offer this therapy. 5. Permanent Nail Avulsion.  Removing the entire nail so that a new nail will not grow back. 

## 2019-03-29 NOTE — Progress Notes (Signed)
Subjective: Jay Holloway presents today with history of diabetes and chief complaint of painful, thick toenails 1-5 b/l that he cannot cut and which interfere with daily activities.  Pain is aggravated when wearing enclosed shoe gear.     Current Outpatient Medications:  .  acetaminophen-codeine (TYLENOL #3) 300-30 MG tablet, 1 po q 8 hrs prn pain, Disp: 30 tablet, Rfl: 0 .  acetaminophen-codeine (TYLENOL #3) 300-30 MG tablet, 1 po q 12 hrs prn pain, Disp: 30 tablet, Rfl: 0 .  amLODipine (NORVASC) 10 MG tablet, Take 10 mg by mouth daily., Disp: , Rfl:  .  atorvastatin (LIPITOR) 10 MG tablet, , Disp: , Rfl:  .  BD PEN NEEDLE NANO U/F 32G X 4 MM MISC, , Disp: , Rfl: 0 .  Cholecalciferol (VITAMIN D3) 25 MCG (1000 UT) CAPS, TK 1 C PO D, Disp: , Rfl:  .  cyclobenzaprine (FLEXERIL) 10 MG tablet, , Disp: , Rfl:  .  HYDROcodone-acetaminophen (NORCO/VICODIN) 5-325 MG tablet, TK 1 T PO TID PRN, Disp: , Rfl:  .  insulin glargine (LANTUS) 100 UNIT/ML injection, Inject 35 Units into the skin at bedtime., Disp: , Rfl:  .  insulin lispro (HUMALOG) 100 UNIT/ML injection, Inject 6 Units into the skin 3 (three) times daily before meals., Disp: , Rfl:  .  irbesartan (AVAPRO) 300 MG tablet, Take 300 mg by mouth daily., Disp: , Rfl: 1 .  meloxicam (MOBIC) 7.5 MG tablet, Take 1 tablet (7.5 mg total) by mouth daily., Disp: 15 tablet, Rfl: 0 .  metFORMIN (GLUCOPHAGE-XR) 500 MG 24 hr tablet, TAKE 1 TABLET BY MOUTH 2 TIMES A DAY WITH FOOD, Disp: , Rfl:  .  NAPROXEN DR 500 MG EC tablet, , Disp: , Rfl:  .  NOVOLOG FLEXPEN 100 UNIT/ML FlexPen, , Disp: , Rfl:  .  rosuvastatin (CRESTOR) 10 MG tablet, Take 10 mg by mouth every morning., Disp: , Rfl:   No Known Allergies  Objective: Vitals:   03/17/19 1504  Temp: (!) 97 F (36.1 C)    Vascular Examination: Capillary refill time immediate x 10 digits.  Dorsalis pedis and Posterior tibial pulses palpable b/l.  Digital hair absent bilaterally.  Skin temperature  gradient WNL b/l.  Dermatological Examination: Skin with normal turgor, texture and tone b/l.  Toenails 1-5 b/l discolored, thick, dystrophic with subungual debris and pain with palpation to nailbeds due to thickness of nails.  Musculoskeletal: Muscle strength 5/5 to all LE muscle groups.  No gross bony deformities b/l.  No pain, crepitus or joint limitation noted with ROM.   Neurological: Sensation intact with 10 gram monofilament.  Vibratory sensation intact.  Assessment: Painful onychomycosis toenails 1-5 b/l  NIDDM  Plan: 1. Toenails 1-5 b/l were debrided in length and girth without iatrogenic bleeding. 2. Patient to continue soft, supportive shoe gear daily. 3. Patient to report any pedal injuries to medical professional immediately. 4. Follow up 3 months.  5. Patient/POA to call should there be a concern in the interim.

## 2019-06-16 ENCOUNTER — Ambulatory Visit: Payer: Medicare HMO | Admitting: Podiatry

## 2019-09-24 ENCOUNTER — Ambulatory Visit: Payer: Medicare HMO

## 2019-09-27 ENCOUNTER — Ambulatory Visit: Payer: Medicare HMO | Attending: Internal Medicine

## 2019-09-27 DIAGNOSIS — Z23 Encounter for immunization: Secondary | ICD-10-CM

## 2019-09-27 NOTE — Progress Notes (Signed)
   Covid-19 Vaccination Clinic  Name:  Jay Holloway    MRN: [id number] DOB: 12/12/1945  09/27/2019  [name] was observed post Covid-19 immunization for 15 minutes without incidence. He was provided with Vaccine Information Sheet and instruction to access the V-Safe system.   [name] was instructed to call 911 with any severe reactions post vaccine: Marland Kitchen Difficulty breathing  . Swelling of your face and throat  . A fast heartbeat  . A bad rash all over your body  . Dizziness and weakness    Immunizations Administered    Name Date Dose VIS Date Route   Pfizer COVID-19 Vaccine 09/27/2019  9:17 AM 0.3 mL 07/09/2019 Intramuscular   Manufacturer: [place]   Lot: [id number]   [place]: [id number]

## 2019-10-20 ENCOUNTER — Ambulatory Visit: Payer: Medicare HMO | Attending: Internal Medicine

## 2019-10-20 DIAGNOSIS — Z23 Encounter for immunization: Secondary | ICD-10-CM

## 2019-10-20 NOTE — Progress Notes (Signed)
   Covid-19 Vaccination Clinic  Name:  Jay Holloway    MRN: [id number] DOB: 07-Jan-1946  10/20/2019  [name] was observed post Covid-19 immunization for 15 minutes without incident. He was provided with Vaccine Information Sheet and instruction to access the V-Safe system.   [name] was instructed to call 911 with any severe reactions post vaccine: Marland Kitchen Difficulty breathing  . Swelling of face and throat  . A fast heartbeat  . A bad rash all over body  . Dizziness and weakness   Immunizations Administered    Name Date Dose VIS Date Route   Pfizer COVID-19 Vaccine 10/20/2019  9:18 AM 0.3 mL 07/09/2019 Intramuscular   Manufacturer: [place]   Lot: [id number]   [place]: [id number]

## 2019-12-24 ENCOUNTER — Emergency Department (HOSPITAL_COMMUNITY)
Admission: EM | Admit: 2019-12-24 | Discharge: 2019-12-28 | Disposition: A | Discharge status: Rehabilitation Facility | Payer: Medicare HMO | Attending: Emergency Medicine | Admitting: Emergency Medicine

## 2019-12-24 ENCOUNTER — Encounter (HOSPITAL_COMMUNITY): Payer: Self-pay | Admitting: Emergency Medicine

## 2019-12-24 ENCOUNTER — Emergency Department (HOSPITAL_COMMUNITY): Payer: Medicare HMO

## 2019-12-24 DIAGNOSIS — E785 Hyperlipidemia, unspecified: Secondary | ICD-10-CM | POA: Insufficient documentation

## 2019-12-24 DIAGNOSIS — R531 Weakness: Secondary | ICD-10-CM

## 2019-12-24 DIAGNOSIS — R5383 Other fatigue: Secondary | ICD-10-CM | POA: Diagnosis present

## 2019-12-24 DIAGNOSIS — Z79899 Other long term (current) drug therapy: Secondary | ICD-10-CM | POA: Diagnosis not present

## 2019-12-24 DIAGNOSIS — E119 Type 2 diabetes mellitus without complications: Secondary | ICD-10-CM | POA: Diagnosis not present

## 2019-12-24 DIAGNOSIS — E860 Dehydration: Secondary | ICD-10-CM | POA: Diagnosis not present

## 2019-12-24 DIAGNOSIS — Z20822 Contact with and (suspected) exposure to covid-19: Secondary | ICD-10-CM | POA: Diagnosis not present

## 2019-12-24 DIAGNOSIS — R627 Adult failure to thrive: Secondary | ICD-10-CM | POA: Diagnosis not present

## 2019-12-24 DIAGNOSIS — R1084 Generalized abdominal pain: Secondary | ICD-10-CM | POA: Insufficient documentation

## 2019-12-24 DIAGNOSIS — Z794 Long term (current) use of insulin: Secondary | ICD-10-CM | POA: Insufficient documentation

## 2019-12-24 DIAGNOSIS — R2243 Localized swelling, mass and lump, lower limb, bilateral: Secondary | ICD-10-CM | POA: Diagnosis not present

## 2019-12-24 DIAGNOSIS — M7918 Myalgia, other site: Secondary | ICD-10-CM | POA: Insufficient documentation

## 2019-12-24 DIAGNOSIS — I10 Essential (primary) hypertension: Secondary | ICD-10-CM | POA: Insufficient documentation

## 2019-12-24 LAB — COMPREHENSIVE METABOLIC PANEL
ALT: 45 U/L — ABNORMAL HIGH (ref 0–44)
AST: 28 U/L (ref 15–41)
Albumin: 3.6 g/dL (ref 3.5–5.0)
Alkaline Phosphatase: 90 U/L (ref 38–126)
Anion gap: 22 — ABNORMAL HIGH (ref 5–15)
BUN: 25 mg/dL — ABNORMAL HIGH (ref 8–23)
CO2: 20 mmol/L — ABNORMAL LOW (ref 22–32)
Calcium: 9.3 mg/dL (ref 8.9–10.3)
Chloride: 94 mmol/L — ABNORMAL LOW (ref 98–111)
Creatinine, Ser: 1.4 mg/dL — ABNORMAL HIGH (ref 0.61–1.24)
GFR calc Af Amer: 58 mL/min — ABNORMAL LOW (ref >60)
GFR calc non Af Amer: 50 mL/min — ABNORMAL LOW (ref >60)
Glucose, Bld: 201 mg/dL — ABNORMAL HIGH (ref 70–99)
Potassium: 4.9 mmol/L (ref 3.5–5.1)
Sodium: 136 mmol/L (ref 135–145)
Total Bilirubin: 1.7 mg/dL — ABNORMAL HIGH (ref 0.3–1.2)
Total Protein: 6.7 g/dL (ref 6.5–8.1)

## 2019-12-24 LAB — CBC WITH DIFFERENTIAL/PLATELET
Abs Immature Granulocytes: 0.05 10*3/uL (ref 0.00–0.07)
Basophils Absolute: 0 10*3/uL (ref 0.0–0.1)
Basophils Relative: 0 %
Eosinophils Absolute: 0 10*3/uL (ref 0.0–0.5)
Eosinophils Relative: 0 %
HCT: 42.1 % (ref 39.0–52.0)
Hemoglobin: 13.7 g/dL (ref 13.0–17.0)
Immature Granulocytes: 0 %
Lymphocytes Relative: 41 %
Lymphs Abs: 4.5 10*3/uL — ABNORMAL HIGH (ref 0.7–4.0)
MCH: 27.6 pg (ref 26.0–34.0)
MCHC: 32.5 g/dL (ref 30.0–36.0)
MCV: 84.9 fL (ref 80.0–100.0)
Monocytes Absolute: 0.7 10*3/uL (ref 0.1–1.0)
Monocytes Relative: 6 %
Neutro Abs: 5.9 10*3/uL (ref 1.7–7.7)
Neutrophils Relative %: 53 %
Platelets: 234 10*3/uL (ref 150–400)
RBC: 4.96 MIL/uL (ref 4.22–5.81)
RDW: 16.9 % — ABNORMAL HIGH (ref 11.5–15.5)
WBC: 11.1 10*3/uL — ABNORMAL HIGH (ref 4.0–10.5)
nRBC: 0 % (ref 0.0–0.2)

## 2019-12-24 LAB — URINALYSIS, ROUTINE W REFLEX MICROSCOPIC
Bilirubin Urine: NEGATIVE
Glucose, UA: >=500 mg/dL — AB
Ketones, ur: 80 mg/dL — AB
Nitrite: NEGATIVE
Protein, ur: 100 mg/dL — AB
Specific Gravity, Urine: 1.018 (ref 1.005–1.030)
pH: 5 (ref 5.0–8.0)

## 2019-12-24 LAB — ETHANOL: Alcohol, Ethyl (B): <10 mg/dL (ref <10)

## 2019-12-24 LAB — LIPASE, BLOOD: Lipase: 27 U/L (ref 11–51)

## 2019-12-24 LAB — BRAIN NATRIURETIC PEPTIDE: B Natriuretic Peptide: 57.8 pg/mL (ref 0.0–100.0)

## 2019-12-24 MED ORDER — HYDROMORPHONE HCL 1 MG/ML IJ SOLN
0.5000 mg | Freq: Once | INTRAMUSCULAR | Status: AC
  Administered 2019-12-24: 0.5 mg via INTRAVENOUS
  Filled 2019-12-24: qty 1

## 2019-12-24 MED ORDER — SODIUM CHLORIDE 0.9 % IV BOLUS
1000.0000 mL | Freq: Once | INTRAVENOUS | Status: AC
  Administered 2019-12-24: 1000 mL via INTRAVENOUS

## 2019-12-24 MED ORDER — IOHEXOL 300 MG/ML  SOLN
100.0000 mL | Freq: Once | INTRAMUSCULAR | Status: AC | PRN
  Administered 2019-12-24: 100 mL via INTRAVENOUS

## 2019-12-24 MED ORDER — SODIUM CHLORIDE (PF) 0.9 % IJ SOLN
INTRAMUSCULAR | Status: AC
  Filled 2019-12-24: qty 50

## 2019-12-24 NOTE — ED Provider Notes (Signed)
[facility] DEPT Provider Note   CSN: [id number] Arrival date & time: 12/24/19  1658     History Chief Complaint  Patient presents with  . Fatigue  . Failure To Thrive    Jay Holloway is a 74 y.o. male.  HPI    Patient presents from home with concern for fatigue, failure to thrive.  Patient states that he has been feeling unwell for about 2 weeks, but in spite of seeing urgent care providers about 1 week ago he has been progressively less capable to perform ADL. He has some abdominal pain inconsistently, none currently, some lower extremity pain, in his hips inconsistently, but none currently, and generalized discomfort. No fever, no vomiting, no diarrhea. No recent medication change, diet change, activity change. Acknowledges history of multiple medical issues including hypertension, hypercholesterolemia. EMS reports the patient was in recliner at his house, unable to ambulate.  In route he was hemodynamically unremarkable, though with mild tachycardia. Past Medical History:  Diagnosis Date  . Arthritis    trouble turning head side to side and up  . Diabetes mellitus without complication ([place])   . Hemorrhoids   . High cholesterol   . Hypertension   . Prostate cancer [facility])     Patient Active Problem List   Diagnosis Date Noted  . Sepsis ([place]) 11/08/2014  . Arterial hypotension   . Renal failure   . Type 2 diabetes mellitus with ketoacidosis without coma ([place])   . Weakness   . Bacteremia   . Blood poisoning   . Urinary tract infectious disease   . DKA (diabetic ketoacidoses) ([place]) 11/05/2014    Past Surgical History:  Procedure Laterality Date  . CHOLECYSTECTOMY N/A 11/11/2014   Procedure: LAPAROSCOPIC CHOLECYSTECTOMY WITH INTRAOPERATIVE CHOLANGIOGRAM;  Surgeon: Erroll Luna, MD;  Location: [facility];  Service: General;  Laterality: N/A;  . COLONOSCOPY N/A 11/09/2014   Procedure: COLONOSCOPY;  Surgeon: Juanita Craver, MD;  Location: [facility]  ENDOSCOPY;  Service: Endoscopy;  Laterality: N/A;  . CYSTOSCOPY WITH RETROGRADE PYELOGRAM, URETEROSCOPY AND STENT PLACEMENT Left 04/18/2014   Procedure: CYSTOSCOPY WITH RETROGRADE PYELOGRAM, URETEROSCOPY AND STENT PLACEMENT;  Surgeon: Raynelle Bring, MD;  Location: [facility];  Service: Urology;  Laterality: Left;  . HOLMIUM LASER APPLICATION Left [id number]   Procedure: HOLMIUM LASER APPLICATION;  Surgeon: Raynelle Bring, MD;  Location: [facility];  Service: Urology;  Laterality: Left;  . LYMPHADENECTOMY  06/04/2012   Procedure: LYMPHADENECTOMY;  Surgeon: Dutch Gray, MD;  Location: [facility];  Service: Urology;  Laterality: Bilateral;  . right  achilles tendon repair  yrs ago  . ROBOT ASSISTED LAPAROSCOPIC RADICAL PROSTATECTOMY  06/04/2012   Procedure: ROBOTIC ASSISTED LAPAROSCOPIC RADICAL PROSTATECTOMY LEVEL 3;  Surgeon: Dutch Gray, MD;  Location: [facility];  Service: Urology;  Laterality: N/A;       No family history on file.  Social History   Tobacco Use  . Smoking status: Former Smoker    Packs/day: 0.25    Years: 4.00    Pack years: 1.00    Types: Cigarettes    Quit date: 12/16/1978    Years since quitting: 41.0  . Smokeless tobacco: Never Used  Substance Use Topics  . Alcohol use: No  . Drug use: No    Home Medications Prior to Admission medications   Medication Sig Start Date End Date Taking? Authorizing Provider  amLODipine (NORVASC) 10 MG tablet Take 10 mg by mouth daily.   Yes [provider]  atorvastatin (LIPITOR) 10 MG tablet Take 10  mg by mouth daily.  04/21/18  Yes [provider]  Cholecalciferol (VITAMIN D3) 25 MCG (1000 UT) CAPS Take 1 capsule by mouth daily.  10/15/18  Yes [provider]  HYDROcodone-acetaminophen (NORCO) 7.5-325 MG tablet Take 1 tablet by mouth 3 (three) times daily as needed for moderate pain or severe pain.  11/26/19  Yes [provider]  insulin glargine (LANTUS) 100 UNIT/ML injection Inject 9 Units into the skin at bedtime.     Yes [provider]  irbesartan (AVAPRO) 300 MG tablet Take 300 mg by mouth daily. 10/28/14  Yes [provider]  metFORMIN (GLUCOPHAGE-XR) 500 MG 24 hr tablet Take 500 mg by mouth in the morning and at bedtime.  10/15/18  Yes [provider]  Multiple Vitamins-Minerals (MULTIVITAMIN ADULTS) TABS Take 1 tablet by mouth daily.   Yes [provider]  NAPROXEN DR 500 MG EC tablet Take 500 mg by mouth 2 (two) times daily as needed (pain).  04/21/18  Yes [provider]  Semaglutide (OZEMPIC, 1 MG/DOSE, [place]) Inject 1 mg into the skin once a week. Wed   Yes [provider]  acetaminophen-codeine (TYLENOL #3) 300-30 MG tablet 1 po q 8 hrs prn pain Patient not taking: Reported on 12/24/2019 10/08/17   Meredith Pel, MD  acetaminophen-codeine (TYLENOL #3) 300-30 MG tablet 1 po q 12 hrs prn pain Patient not taking: Reported on 12/24/2019 10/20/17   Meredith Pel, MD  BD PEN NEEDLE NANO U/F 32G X 4 MM MISC  09/21/14   [provider]  meloxicam (MOBIC) 7.5 MG tablet Take 1 tablet (7.5 mg total) by mouth daily. Patient not taking: Reported on 12/24/2019 09/25/17   Ok Edwards, PA-C    Allergies    Patient has no known allergies.  Review of Systems   Review of Systems  Constitutional:       Per HPI, otherwise negative  HENT:       Per HPI, otherwise negative  Respiratory:       Per HPI, otherwise negative  Cardiovascular:       Per HPI, otherwise negative  Gastrointestinal: Negative for vomiting.  Endocrine:       Negative aside from HPI  Genitourinary:       Neg aside from HPI   Musculoskeletal:       Per HPI, otherwise negative  Skin: Negative.   Neurological: Positive for weakness. Negative for syncope.    Physical Exam Updated Vital Signs BP 124/69   Pulse (!) 103   Temp 98.1 F (36.7 C) (Oral)   Resp 14   SpO2 96%   Physical Exam Vitals and nursing note reviewed.  Constitutional:      General: He is not in  acute distress.    Appearance: He is well-developed.  HENT:     Head: Normocephalic and atraumatic.  Eyes:     Conjunctiva/sclera: Conjunctivae normal.  Cardiovascular:     Rate and Rhythm: Regular rhythm. Tachycardia present.  Pulmonary:     Effort: Pulmonary effort is normal. No respiratory distress.     Breath sounds: No stridor.  Abdominal:     General: There is no distension.     Tenderness: There is no abdominal tenderness.  Musculoskeletal:     Right lower leg: Edema present.     Left lower leg: Edema present.  Skin:    General: Skin is warm and dry.  Neurological:     General: No focal deficit present.  Mental Status: He is alert and oriented to person, place, and time.     Cranial Nerves: No dysarthria.     Motor: Atrophy present. No tremor or abnormal muscle tone.     ED Results / Procedures / Treatments   Labs (all labs ordered are listed, but only abnormal results are displayed) Labs Reviewed  COMPREHENSIVE METABOLIC PANEL - Abnormal; Notable for the following components:      Result Value   Chloride 94 (*)    CO2 20 (*)    Glucose, Bld 201 (*)    BUN 25 (*)    Creatinine, Ser 1.40 (*)    ALT 45 (*)    Total Bilirubin 1.7 (*)    GFR calc non Af Amer 50 (*)    GFR calc Af Amer 58 (*)    Anion gap 22 (*)    All other components within normal limits  CBC WITH DIFFERENTIAL/PLATELET - Abnormal; Notable for the following components:   WBC 11.1 (*)    RDW 16.9 (*)    Lymphs Abs 4.5 (*)    All other components within normal limits  URINALYSIS, ROUTINE W REFLEX MICROSCOPIC - Abnormal; Notable for the following components:   APPearance HAZY (*)    Glucose, UA >=500 (*)    Hgb urine dipstick SMALL (*)    Ketones, ur 80 (*)    Protein, ur 100 (*)    Leukocytes,Ua TRACE (*)    Bacteria, UA RARE (*)    All other components within normal limits  ETHANOL  LIPASE, BLOOD  BRAIN NATRIURETIC PEPTIDE    EKG None  Radiology DG Chest Port 1 View  Result  Date: 12/24/2019 CLINICAL DATA:  Weakness and fatigue.  Lower extremity edema. EXAM: PORTABLE CHEST 1 VIEW COMPARISON:  11/05/2014 FINDINGS: Heart size is within normal limits. Chronic elevation of right hemidiaphragm is again seen with right basilar scarring. No evidence of pulmonary infiltrate or edema. No evidence of pleural effusion. IMPRESSION: Chronic elevation of right hemidiaphragm and right basilar scarring. No active disease. Electronically Signed   By: Marlaine Hind M.D.   On: 12/24/2019 21:06    Procedures Procedures (including critical care time)  Medications Ordered in ED Medications  sodium chloride (PF) 0.9 % injection (has no administration in time range)  sodium chloride 0.9 % bolus 1,000 mL (1,000 mLs Intravenous New Bag/Given 12/24/19 2236)  HYDROmorphone (DILAUDID) injection 0.5 mg (0.5 mg Intravenous Given 12/24/19 2236)  iohexol (OMNIPAQUE) 300 MG/ML solution 100 mL (100 mLs Intravenous Contrast Given 12/24/19 2332)    ED Course  I have reviewed the triage vital signs and the nursing notes.  Pertinent labs & imaging results that were available during my care of the patient were reviewed by me and considered in my medical decision making (see chart for details). On repeat exam the patient is accompanied by male companion.  On we discussed presentation again, she and he both note that patient has become too weak to perform ADL, though he was doing so as recently as about 2 weeks ago. He has received initial fluid resuscitation, and initial labs are consistent with dehydration with ketonuria, slight elevation in his creatinine from baseline, though not to the threshold of acute kidney injury.  11:58 PM Remaining labs generally reassuring, with mild leukocytosis, but no substantial left shift.  CT reviewed no acute findings.  Given the patient's weakness, dehydration, after discussing all findings with him, and his male companion, patient is incapable of returning home, will  await case management/social work consult for evaluation of disposition. Final Clinical Impression(s) / ED Diagnoses Final diagnoses:  Dehydration  Weakness     Carmin Muskrat, MD 12/25/19 0000

## 2019-12-24 NOTE — ED Triage Notes (Signed)
Per [facility] pt lives alone coming from home for weakness and FTT. Has bilat leg edema for unknown amount time. Per EMS pt was in recliner in house and unable to ambulate with them.  Vitals: CBg 222, 116HR, 97% onRA, 110/69.

## 2019-12-25 ENCOUNTER — Other Ambulatory Visit: Payer: Self-pay

## 2019-12-25 LAB — CBG MONITORING, ED: Glucose-Capillary: 164 mg/dL — ABNORMAL HIGH (ref 70–99)

## 2019-12-25 MED ORDER — SODIUM CHLORIDE 0.9 % IV SOLN: Freq: Once | INTRAVENOUS | Status: AC

## 2019-12-25 MED ORDER — AMLODIPINE BESYLATE 5 MG PO TABS
10.0000 mg | ORAL_TABLET | Freq: Every day | ORAL | Status: DC
  Administered 2019-12-25 – 2019-12-28 (×4): 10 mg via ORAL
  Filled 2019-12-25 (×4): qty 2

## 2019-12-25 MED ORDER — HYDROCODONE-ACETAMINOPHEN 7.5-325 MG PO TABS
1.0000 | ORAL_TABLET | Freq: Three times a day (TID) | ORAL | Status: DC | PRN
  Administered 2019-12-25 – 2019-12-26 (×5): 1 via ORAL
  Filled 2019-12-25 (×5): qty 1

## 2019-12-25 MED ORDER — NAPROXEN 500 MG PO TABS
500.0000 mg | ORAL_TABLET | Freq: Two times a day (BID) | ORAL | Status: DC | PRN
  Administered 2019-12-26 – 2019-12-27 (×3): 500 mg via ORAL
  Filled 2019-12-25 (×3): qty 1

## 2019-12-25 MED ORDER — METFORMIN HCL ER 500 MG PO TB24
500.0000 mg | ORAL_TABLET | Freq: Every day | ORAL | Status: DC
  Administered 2019-12-25 – 2019-12-28 (×4): 500 mg via ORAL
  Filled 2019-12-25 (×4): qty 1

## 2019-12-25 MED ORDER — ATORVASTATIN CALCIUM 10 MG PO TABS
10.0000 mg | ORAL_TABLET | Freq: Every day | ORAL | Status: DC
  Administered 2019-12-25 – 2019-12-28 (×4): 10 mg via ORAL
  Filled 2019-12-25 (×4): qty 1

## 2019-12-25 MED ORDER — VITAMIN D 25 MCG (1000 UNIT) PO TABS
1000.0000 [IU] | ORAL_TABLET | Freq: Every day | ORAL | Status: DC
  Administered 2019-12-25 – 2019-12-28 (×4): 1000 [IU] via ORAL
  Filled 2019-12-25 (×4): qty 1

## 2019-12-25 MED ORDER — IRBESARTAN 300 MG PO TABS
300.0000 mg | ORAL_TABLET | Freq: Every day | ORAL | Status: DC
  Administered 2019-12-25 – 2019-12-28 (×4): 300 mg via ORAL
  Filled 2019-12-25 (×4): qty 1

## 2019-12-25 MED ORDER — INSULIN GLARGINE 100 UNIT/ML ~~LOC~~ SOLN
9.0000 [IU] | Freq: Every day | SUBCUTANEOUS | Status: DC
  Administered 2019-12-25 – 2019-12-27 (×4): 9 [IU] via SUBCUTANEOUS
  Filled 2019-12-25 (×4): qty 0.09

## 2019-12-25 NOTE — [place] FL2 (Signed)
[place] MEDICAID FL2 LEVEL OF CARE SCREENING TOOL     IDENTIFICATION  Patient Name: Jay Holloway Birthdate: 1945-08-12 Sex: male Admission Date (Current Location): 12/24/2019  [facility] and [place] Number:  Herbalist and Address:  [facility],  [place] [address] [place]      Provider Number: [id number]  Attending Physician Name and Address:  Default, Provider, MD  Relative Name and Phone Number:       Current Level of Care: Hospital Recommended Level of Care: [place] Prior Approval Number:    Date Approved/Denied:   PASRR Number: [id number]  Discharge Plan: SNF    Current Diagnoses: Patient Active Problem List   Diagnosis Date Noted  . Sepsis ([place]) 11/08/2014  . Arterial hypotension   . Renal failure   . Type 2 diabetes mellitus with ketoacidosis without coma ([place])   . Weakness   . Bacteremia   . Blood poisoning   . Urinary tract infectious disease   . DKA (diabetic ketoacidoses) ([place]) 11/05/2014    Orientation RESPIRATION BLADDER Height & Weight     Self, Time, Situation, Place  Normal Continent Weight:   Height:     BEHAVIORAL SYMPTOMS/MOOD NEUROLOGICAL BOWEL NUTRITION STATUS      Continent Diet(Normal diet)  AMBULATORY STATUS COMMUNICATION OF NEEDS Skin   Limited Assist Verbally Normal                       Personal Care Assistance Level of Assistance  Dressing, Feeding, Bathing Bathing Assistance: Independent Feeding assistance: Independent Dressing Assistance: Limited assistance     Functional Limitations Info             SPECIAL CARE FACTORS FREQUENCY  PT (By licensed PT), OT (By licensed OT)     PT Frequency: 5x weekly OT Frequency: 5x weekly            Contractures Contractures Info: Not present    Additional Factors Info  Code Status Code Status Info: Full Code             Current Medications (12/25/2019):  This is the current hospital active medication  list Current Facility-Administered Medications  Medication Dose Route Frequency Provider Last Rate Last Admin  . amLODipine (NORVASC) tablet 10 mg  10 mg Oral Daily Carmin Muskrat, MD   10 mg at 12/25/19 0935  . atorvastatin (LIPITOR) tablet 10 mg  10 mg Oral Daily Carmin Muskrat, MD   10 mg at 12/25/19 0935  . cholecalciferol (VITAMIN D3) tablet 1,000 Units  1,000 Units Oral Daily Carmin Muskrat, MD   1,000 Units at 12/25/19 0935  . HYDROcodone-acetaminophen (NORCO) 7.5-325 MG per tablet 1 tablet  1 tablet Oral TID PRN Carmin Muskrat, MD   1 tablet at 12/25/19 1206  . insulin glargine (LANTUS) injection 9 Units  9 Units Subcutaneous QHS Carmin Muskrat, MD   9 Units at 12/25/19 0221  . irbesartan (AVAPRO) tablet 300 mg  300 mg Oral Daily Carmin Muskrat, MD   300 mg at 12/25/19 0935  . metFORMIN (GLUCOPHAGE-XR) 24 hr tablet 500 mg  500 mg Oral Q breakfast Carmin Muskrat, MD   500 mg at 12/25/19 [id number]  . naproxen (NAPROSYN) tablet 500 mg  500 mg Oral BID PRN Carmin Muskrat, MD       Current Outpatient Medications  Medication Sig Dispense Refill  . amLODipine (NORVASC) 10 MG tablet Take 10 mg by mouth daily.    Marland Kitchen atorvastatin (LIPITOR) 10  MG tablet Take 10 mg by mouth daily.     . Cholecalciferol (VITAMIN D3) 25 MCG (1000 UT) CAPS Take 1 capsule by mouth daily.     Marland Kitchen HYDROcodone-acetaminophen (NORCO) 7.5-325 MG tablet Take 1 tablet by mouth 3 (three) times daily as needed for moderate pain or severe pain.     Marland Kitchen insulin glargine (LANTUS) 100 UNIT/ML injection Inject 9 Units into the skin at bedtime.     . irbesartan (AVAPRO) 300 MG tablet Take 300 mg by mouth daily.  1  . metFORMIN (GLUCOPHAGE-XR) 500 MG 24 hr tablet Take 500 mg by mouth in the morning and at bedtime.     . Multiple Vitamins-Minerals (MULTIVITAMIN ADULTS) TABS Take 1 tablet by mouth daily.    Marland Kitchen NAPROXEN DR 500 MG EC tablet Take 500 mg by mouth 2 (two) times daily as needed (pain).     . Semaglutide (OZEMPIC, 1  MG/DOSE, [place]) Inject 1 mg into the skin once a week. Wed    . acetaminophen-codeine (TYLENOL #3) 300-30 MG tablet 1 po q 8 hrs prn pain (Patient not taking: Reported on 12/24/2019) 30 tablet 0  . acetaminophen-codeine (TYLENOL #3) 300-30 MG tablet 1 po q 12 hrs prn pain (Patient not taking: Reported on 12/24/2019) 30 tablet 0  . BD PEN NEEDLE NANO U/F 32G X 4 MM MISC   0  . meloxicam (MOBIC) 7.5 MG tablet Take 1 tablet (7.5 mg total) by mouth daily. (Patient not taking: Reported on 12/24/2019) 15 tablet 0     Discharge Medications: Please see discharge summary for a list of discharge medications.  Relevant Imaging Results:  Relevant Lab Results:   Additional Information SSN: [id number]  Archie Endo, LCSW

## 2019-12-25 NOTE — ED Notes (Signed)
Pt moved from ED stretcher to hospital bed.  

## 2019-12-25 NOTE — ED Notes (Signed)
Patient called out stating he had to use the bathroom and have a bowel movement, patient was found to have had an episode of urinary incontinence. Patient changed and cleaned up, no bowel movement on the bedpan. Male pure-wick has been placed, new blankets and linens applied.

## 2019-12-25 NOTE — TOC Initial Note (Signed)
Transition of Care [facility]) - Initial/Assessment Note    Patient Details  Name: Jay Holloway MRN: [id number] Date of Birth: 12/17/1945  [facility]) CM/SW Contact:    Archie Endo, LCSW Phone Number: 12/25/2019, 2:36 PM  Clinical Narrative:                 CSW spoke with patient to complete initial assessment. Patient is agreeable to discharge to SNF for rehab. Patient states he lives alone and has no children. Patient states he has never been to facility. Patient prefers placement in [facility]. CSW informed patient that due to his insurance, an authorization would be required prior to him going to the facility and he stated understanding. CSW answered all the patient's questions.  CSW completed FL2 for patient and sent his information out to facilities for review to obtain bed offers.  Expected Discharge Plan: Skilled Nursing Facility Barriers to Discharge: Ship broker, SNF Pending bed offer   Patient Goals and CMS Choice        Expected Discharge Plan and Services Expected Discharge Plan: National       Living arrangements for the past 2 months: Single Family Home                                      Prior Living Arrangements/Services Living arrangements for the past 2 months: Single Family Home Lives with:: Self Patient language and need for interpreter reviewed:: Yes Do you feel safe going back to the place where you live?: Yes      Need for Family Participation in Patient Care: No (Comment) Care giver support system in place?: No (comment)   Criminal Activity/Legal Involvement Pertinent to Current Situation/Hospitalization: No - Comment as needed  Activities of Daily Living      Permission Sought/Granted                  Emotional Assessment Appearance:: Appears stated age Attitude/Demeanor/Rapport: Engaged   Orientation: : Oriented to Self, Oriented to Place, Oriented to  Time, Oriented to  Situation Alcohol / Substance Use: Not Applicable Psych Involvement: No (comment)  Admission diagnosis:  Weakness; Leg Swelling Patient Active Problem List   Diagnosis Date Noted  . Sepsis ([place]) 11/08/2014  . Arterial hypotension   . Renal failure   . Type 2 diabetes mellitus with ketoacidosis without coma ([place])   . Weakness   . Bacteremia   . Blood poisoning   . Urinary tract infectious disease   . DKA (diabetic ketoacidoses) ([place]) 11/05/2014   PCP:  [name], MD Pharmacy:   [facility] [place], [place] [place] [address] Phone: [phone] Fax: [phone]  [facility] [address] [place]), [place] [id number] [place] ([place]) [place] [address] Phone: [phone] Fax: [phone]     Social Determinants of Health (SDOH) Interventions    Readmission Risk Interventions No flowsheet data found.

## 2019-12-25 NOTE — ED Notes (Signed)
Male pure wick has been placed. 

## 2019-12-25 NOTE — Evaluation (Signed)
Physical Therapy Evaluation Patient Details Name: Jay Holloway MRN: [id number] DOB: [birth year] Today's Date: 12/25/2019   History of Present Illness  Pt is 74 yo male with PMH including arthritis, DM, high cholesterol, prostate CA, and HTN.  Pt presented to hospital with fatigue, weakness, and FTT.  Reports decline in mobility over past 2 weeks.  Clinical Impression   Pt admitted with above diagnosis. Pt is normally independent with ADLs and IADLs and community ambulation with a cane; however, over the past 2 weeks has had a decline in function.  Pt now requires min-mod A for transfers and only able to ambulate a few feet before fatigues.  Pt presents with generalized weakness in all extremities and trunk and decreased mobility/balance.  Pt lives alone and is not safe to return home alone from PT perspective.  PT recommends SNF; pt with good rehab potential as he was independent a few weeks ago.  Pt currently with functional limitations due to the deficits listed below (see PT Problem List). Pt will benefit from skilled PT to increase their independence and safety with mobility to allow discharge to the venue listed below.       Follow Up Recommendations SNF;Supervision/Assistance - 24 hour    Equipment Recommendations  3in1 (PT)    Recommendations for Other Services       Precautions / Restrictions Precautions Precautions: Fall      Mobility  Bed Mobility Overal bed mobility: Needs Assistance Bed Mobility: Supine to Sit;Sit to Supine     Supine to sit: Mod assist Sit to supine: Mod assist   General bed mobility comments: Increased time; HOB elevated; assist for legs in and out of bed; assist to elevate trunk  Transfers Overall transfer level: Needs assistance Equipment used: Rolling walker (2 wheeled) Transfers: Sit to/from [facility] Sit to Stand: Min assist Stand pivot transfers: Min assist       General transfer comment: Pt with slow cautious  transfers; cued for safe hand placement and RW use; pt stood from bed x 2 and bsc x 1; required assist with ADLs  Ambulation/Gait Ambulation/Gait assistance: Min assist Gait Distance (Feet): 3 Feet(x2) Assistive device: Rolling walker (2 wheeled) Gait Pattern/deviations: Decreased stride length;Step-to pattern;Shuffle Gait velocity: decreased   General Gait Details: small steps at EOB; limited by fatigue  Stairs            Wheelchair Mobility    Modified Rankin (Stroke Patients Only)       Balance Overall balance assessment: Needs assistance Sitting-balance support: No upper extremity supported;Feet supported Sitting balance-Leahy Scale: Good     Standing balance support: Bilateral upper extremity supported;During functional activity Standing balance-Leahy Scale: Fair Standing balance comment: required use of RW                             Pertinent Vitals/Pain Pain Assessment: 0-10 Pain Score: 8  Pain Location: Back and upper thighs Pain Descriptors / Indicators: Discomfort;Sore Pain Intervention(s): Limited activity within patient's tolerance;Monitored during session;Premedicated before session    Home Living Family/patient expects to be discharged to:: Skilled nursing facility Living Arrangements: Alone Available Help at Discharge: Friend(s);Available PRN/intermittently(has a friend that checks every couple of days) Type of Home: House Home Access: Stairs to enter Entrance Stairs-Rails: Left Entrance Stairs-Number of Steps: 4 Home Layout: One level Home Equipment: Grab bars - toilet;Walker - 2 wheels;Cane - single point      Prior Function Level of Independence: Needs  assistance   Gait / Transfers Assistance Needed: Reports was using a cane and could ambulate in community.  Reports gradual decline over past couple of weeks to point of not being able to walk.  ADL's / Homemaking Assistance Needed: Reports independent with ADLs and IADls.         Hand Dominance        Extremity/Trunk Assessment   Upper Extremity Assessment Upper Extremity Assessment: LUE deficits/detail;RUE deficits/detail RUE Deficits / Details: ROM WFL; MMT 4/5 LUE Deficits / Details: ROM WFL; MMT 4/5    Lower Extremity Assessment Lower Extremity Assessment: LLE deficits/detail;RLE deficits/detail RLE Deficits / Details: ROM WFL; MMT 4/5; pitting edema LLE Deficits / Details: ROM WFL; MMT 4/5; pitting edema    Cervical / Trunk Assessment Cervical / Trunk Assessment: Normal  Communication   Communication: No difficulties  Cognition Arousal/Alertness: Awake/alert Behavior During Therapy: WFL for tasks assessed/performed Overall Cognitive Status: No family/caregiver present to determine baseline cognitive functioning Area of Impairment: Orientation                 Orientation Level: (stated June 2021)                    General Comments General comments (skin integrity, edema, etc.): VSS on RA    Exercises     Assessment/Plan    PT Assessment Patient needs continued PT services  PT Problem List Decreased strength;Decreased mobility;Decreased range of motion;Decreased knowledge of precautions;Decreased activity tolerance;Decreased balance;Decreased knowledge of use of DME       PT Treatment Interventions DME instruction;Therapeutic exercise;Gait training;Balance training;Stair training;Functional mobility training;Therapeutic activities;Patient/family education    PT Goals (Current goals can be found in the Care Plan section)  Acute Rehab PT Goals Patient Stated Goal: get stronger; regain mobility PT Goal Formulation: With patient Time For Goal Achievement: 01/08/20 Potential to Achieve Goals: Good    Frequency Min 2X/week   Barriers to discharge Decreased caregiver support      Co-evaluation               AM-PAC PT "6 Clicks" Mobility  Outcome Measure Help needed turning from your back to your side while  in a flat bed without using bedrails?: A Lot Help needed moving from lying on your back to sitting on the side of a flat bed without using bedrails?: A Lot Help needed moving to and from a bed to a chair (including a wheelchair)?: A Little Help needed standing up from a chair using your arms (e.g., wheelchair or bedside chair)?: A Little Help needed to walk in hospital room?: A Lot Help needed climbing 3-5 steps with a railing? : A Lot 6 Click Score: 14    End of Session Equipment Utilized During Treatment: Gait belt Activity Tolerance: Patient tolerated treatment well Patient left: in bed;with call bell/phone within reach Nurse Communication: Mobility status PT Visit Diagnosis: Unsteadiness on feet (R26.81);Muscle weakness (generalized) (M62.81)    Time: [id number] PT Time Calculation (min) (ACUTE ONLY): 34 min   Charges:   PT Evaluation $PT Eval Low Complexity: 1 Low PT Treatments $Therapeutic Activity: 8-22 mins        Maggie Font, PT Acute Rehab Services Pager [phone] [facility] Rehab [phone] [facility] 12/25/2019, 1:55 PM

## 2019-12-26 LAB — CBG MONITORING, ED
Glucose-Capillary: 206 mg/dL — ABNORMAL HIGH (ref 70–99)
Glucose-Capillary: 283 mg/dL — ABNORMAL HIGH (ref 70–99)

## 2019-12-26 NOTE — Progress Notes (Signed)
CSW met with Pt at bedside to inform Pt of bed offers.   Pt states that he would prefer [facility].  CSW reached out to [facility] in an effort to begin d/c process.

## 2019-12-26 NOTE — Progress Notes (Signed)
CSW began authorization process with [facility].  Faxed clinical notes to [phone]  Pending auth # [id number]

## 2019-12-26 NOTE — ED Notes (Addendum)
ASSUMED CARE OF PT. AAOX4. PT IN NO APPARENT DISTRESS OR PAIN. AWAITING BED PLACEMENT.

## 2019-12-27 LAB — CBG MONITORING, ED: Glucose-Capillary: 312 mg/dL — ABNORMAL HIGH (ref 70–99)

## 2019-12-27 LAB — SARS CORONAVIRUS 2 BY RT PCR (HOSPITAL ORDER, PERFORMED IN ~~LOC~~ HOSPITAL LAB): SARS Coronavirus 2: NEGATIVE

## 2019-12-27 NOTE — ED Notes (Signed)
Pt repositioned in bed, purewick reapplied. Perineal care provided and brief change. Pt resting comfortably. No other needs at this time.

## 2019-12-27 NOTE — ED Notes (Signed)
Pt repositioned in bed, changed under pad and provided perineal care. Pt was provided fluids.

## 2019-12-27 NOTE — TOC Progression Note (Addendum)
Transition of Care [facility]) - Progression Note    Patient Details  Name: Jay Holloway MRN: [id number] Date of Birth: March 14, 1946  Transition of Care [facility]) CM/SW [place], [place] Phone Number: [phone] 12/27/2019, 2:10 PM  Clinical Narrative:     Patient has been accepted to [facility].  Pt will need a rapid COVID test and confirmation on COVID vaccinations.  Please After Visit Summary, before pt d/c.  Report # and Room will be assigned once they receive COVID test results.  Expected Discharge Plan: Skilled Nursing Facility Barriers to Discharge: Ship broker, SNF Pending bed offer  Expected Discharge Plan and Services Expected Discharge Plan: [place] arrangements for the past 2 months: Single Family Home                                       Social Determinants of Health (SDOH) Interventions    Readmission Risk Interventions No flowsheet data found.

## 2019-12-27 NOTE — ED Notes (Signed)
Pts friend came to visit. Visitors brought back by security, wanded, belongings placed in secure locker.

## 2019-12-27 NOTE — ED Notes (Signed)
Pt resting in bed with eyes closed.

## 2019-12-27 NOTE — ED Notes (Signed)
Pt came to room 28 per bed.  Pt is pleasant and cooperative.  Purwick is in place and patient is clean and dry.

## 2019-12-27 NOTE — ED Notes (Signed)
Pt sat on side of bed. Repositioned, fluids offered. No other needs at this time.

## 2019-12-27 NOTE — ED Notes (Signed)
Covid results, vaccine record , and AVS was faxed to facility as requested.

## 2019-12-27 NOTE — Progress Notes (Signed)
CSW called Juliann Pulse at [facility] SNF at ph: [phone] to see if the COVID results and AVS faxed to [facility] have been seen/read/reviewed and to see if the pt can D/C to [facility] SNF today.   CSW awaiting return call from Juliann Pulse is admissions at [facility] now.  CSW will continue to follow for D/C needs.  Alphonse Guild. Jimmi Sidener  MSW, LCSW, LCAS, CCS Transitions of Care Clinical Social Worker Care Coordination Department Ph: [phone]

## 2019-12-28 DIAGNOSIS — R42 Dizziness and giddiness: Secondary | ICD-10-CM | POA: Diagnosis not present

## 2019-12-28 DIAGNOSIS — E1165 Type 2 diabetes mellitus with hyperglycemia: Secondary | ICD-10-CM | POA: Diagnosis not present

## 2019-12-28 DIAGNOSIS — E785 Hyperlipidemia, unspecified: Secondary | ICD-10-CM | POA: Diagnosis not present

## 2019-12-28 DIAGNOSIS — M2550 Pain in unspecified joint: Secondary | ICD-10-CM | POA: Diagnosis not present

## 2019-12-28 DIAGNOSIS — R031 Nonspecific low blood-pressure reading: Secondary | ICD-10-CM | POA: Diagnosis not present

## 2019-12-28 DIAGNOSIS — G8929 Other chronic pain: Secondary | ICD-10-CM | POA: Diagnosis not present

## 2019-12-28 DIAGNOSIS — E860 Dehydration: Secondary | ICD-10-CM | POA: Diagnosis not present

## 2019-12-28 DIAGNOSIS — E119 Type 2 diabetes mellitus without complications: Secondary | ICD-10-CM | POA: Diagnosis not present

## 2019-12-28 DIAGNOSIS — R627 Adult failure to thrive: Secondary | ICD-10-CM | POA: Diagnosis not present

## 2019-12-28 DIAGNOSIS — E118 Type 2 diabetes mellitus with unspecified complications: Secondary | ICD-10-CM | POA: Diagnosis not present

## 2019-12-28 DIAGNOSIS — I10 Essential (primary) hypertension: Secondary | ICD-10-CM | POA: Diagnosis not present

## 2019-12-28 DIAGNOSIS — M1280 Other specific arthropathies, not elsewhere classified, unspecified site: Secondary | ICD-10-CM | POA: Diagnosis not present

## 2019-12-28 DIAGNOSIS — R531 Weakness: Secondary | ICD-10-CM | POA: Diagnosis not present

## 2019-12-28 DIAGNOSIS — M6281 Muscle weakness (generalized): Secondary | ICD-10-CM | POA: Diagnosis not present

## 2019-12-28 DIAGNOSIS — Z7401 Bed confinement status: Secondary | ICD-10-CM | POA: Diagnosis not present

## 2019-12-28 DIAGNOSIS — R52 Pain, unspecified: Secondary | ICD-10-CM | POA: Diagnosis not present

## 2019-12-28 LAB — CBG MONITORING, ED: Glucose-Capillary: 240 mg/dL — ABNORMAL HIGH (ref 70–99)

## 2019-12-28 NOTE — TOC Transition Note (Signed)
Transition of Care [facility]) - CM/SW Discharge Note   Patient Details  Name: Jay Holloway MRN: [id number] Date of Birth: 10-25-1945  [facility] of [facility]) CM/SW Contact:  [place], LCSW Phone Number: 12/28/2019, 10:17 AM   Clinical Narrative:   CSW received call from admissions coordinator Claiborne Billings at [facility]. CSW confirms that patient has received COVID vaccinations and COVID test has returned negative. Patient will be discharging to [place] care Room # 122B. Number to call for report is [phone].   CSW will make family aware of patient transition.   [place] Transitions of Care  Clinical Social Worker  Ph: [phone]    Final next level of care: Skilled Nursing Facility Barriers to Discharge: Insurance Authorization, SNF Pending bed offer   Patient Goals and CMS Choice        Discharge Placement                       Discharge Plan and Services                                     Social Determinants of Health (SDOH) Interventions     Readmission Risk Interventions No flowsheet data found.

## 2019-12-28 NOTE — ED Notes (Signed)
Pt off unit to [facility] per provider. Pt alert, calm, cooperative, no s/s of distress. DC information and belongings given to P Tar for facility. Pt off unit in stretcher. Pt transported by P [facility].

## 2019-12-30 DIAGNOSIS — I10 Essential (primary) hypertension: Secondary | ICD-10-CM | POA: Diagnosis not present

## 2019-12-30 DIAGNOSIS — R627 Adult failure to thrive: Secondary | ICD-10-CM | POA: Diagnosis not present

## 2019-12-30 DIAGNOSIS — E119 Type 2 diabetes mellitus without complications: Secondary | ICD-10-CM | POA: Diagnosis not present

## 2019-12-30 DIAGNOSIS — R531 Weakness: Secondary | ICD-10-CM | POA: Diagnosis not present

## 2020-01-11 DIAGNOSIS — R031 Nonspecific low blood-pressure reading: Secondary | ICD-10-CM | POA: Diagnosis not present

## 2020-01-11 DIAGNOSIS — G8929 Other chronic pain: Secondary | ICD-10-CM | POA: Diagnosis not present

## 2020-01-11 DIAGNOSIS — I10 Essential (primary) hypertension: Secondary | ICD-10-CM | POA: Diagnosis not present

## 2020-01-11 DIAGNOSIS — E1165 Type 2 diabetes mellitus with hyperglycemia: Secondary | ICD-10-CM | POA: Diagnosis not present

## 2020-01-11 DIAGNOSIS — R42 Dizziness and giddiness: Secondary | ICD-10-CM | POA: Diagnosis not present

## 2020-01-13 DIAGNOSIS — I10 Essential (primary) hypertension: Secondary | ICD-10-CM | POA: Diagnosis not present

## 2020-01-13 DIAGNOSIS — E118 Type 2 diabetes mellitus with unspecified complications: Secondary | ICD-10-CM | POA: Diagnosis not present

## 2020-01-13 DIAGNOSIS — M1280 Other specific arthropathies, not elsewhere classified, unspecified site: Secondary | ICD-10-CM | POA: Diagnosis not present

## 2020-01-13 DIAGNOSIS — M6281 Muscle weakness (generalized): Secondary | ICD-10-CM | POA: Diagnosis not present

## 2020-01-13 DIAGNOSIS — R627 Adult failure to thrive: Secondary | ICD-10-CM | POA: Diagnosis not present

## 2020-01-13 DIAGNOSIS — E785 Hyperlipidemia, unspecified: Secondary | ICD-10-CM | POA: Diagnosis not present

## 2020-01-17 ENCOUNTER — Other Ambulatory Visit: Payer: Self-pay

## 2020-01-17 NOTE — Patient Outreach (Signed)
[name]  01/17/2020  Jay Holloway 12-Dec-1945 [id number]     Transition of Care Referral  Referral Date: 01/17/2020 Referral Source: [facility] Discharge Report Date of Discharge: 01/15/2020 Facility: [name] Insurance: [facility]    Referral received. Transition of care calls being completed via EMMI-automated calls. RN CM will outreach patient for any red flags received.    Plan: RN CM will close case at this time.    Enzo Montgomery, RN,BSN,CCM [name] Telephonic Care Management Coordinator Direct Phone: [phone] Toll Free: [phone] Fax: [phone]

## 2020-01-19 DIAGNOSIS — E860 Dehydration: Secondary | ICD-10-CM | POA: Diagnosis not present

## 2020-01-19 DIAGNOSIS — R627 Adult failure to thrive: Secondary | ICD-10-CM | POA: Diagnosis not present

## 2020-01-19 DIAGNOSIS — M6281 Muscle weakness (generalized): Secondary | ICD-10-CM | POA: Diagnosis not present

## 2020-01-19 DIAGNOSIS — M1280 Other specific arthropathies, not elsewhere classified, unspecified site: Secondary | ICD-10-CM | POA: Diagnosis not present

## 2020-01-20 DIAGNOSIS — M79661 Pain in right lower leg: Secondary | ICD-10-CM | POA: Diagnosis not present

## 2020-01-20 DIAGNOSIS — G8929 Other chronic pain: Secondary | ICD-10-CM | POA: Diagnosis not present

## 2020-01-20 DIAGNOSIS — R627 Adult failure to thrive: Secondary | ICD-10-CM | POA: Diagnosis not present

## 2020-01-20 DIAGNOSIS — I10 Essential (primary) hypertension: Secondary | ICD-10-CM | POA: Diagnosis not present

## 2020-01-20 DIAGNOSIS — M1990 Unspecified osteoarthritis, unspecified site: Secondary | ICD-10-CM | POA: Diagnosis not present

## 2020-01-20 DIAGNOSIS — K649 Unspecified hemorrhoids: Secondary | ICD-10-CM | POA: Diagnosis not present

## 2020-01-20 DIAGNOSIS — E7800 Pure hypercholesterolemia, unspecified: Secondary | ICD-10-CM | POA: Diagnosis not present

## 2020-01-20 DIAGNOSIS — Z8546 Personal history of malignant neoplasm of prostate: Secondary | ICD-10-CM | POA: Diagnosis not present

## 2020-01-20 DIAGNOSIS — E119 Type 2 diabetes mellitus without complications: Secondary | ICD-10-CM | POA: Diagnosis not present

## 2020-01-24 DIAGNOSIS — E789 Disorder of lipoprotein metabolism, unspecified: Secondary | ICD-10-CM | POA: Diagnosis not present

## 2020-01-24 DIAGNOSIS — I10 Essential (primary) hypertension: Secondary | ICD-10-CM | POA: Diagnosis not present

## 2020-01-24 DIAGNOSIS — K921 Melena: Secondary | ICD-10-CM | POA: Diagnosis not present

## 2020-01-24 DIAGNOSIS — R5381 Other malaise: Secondary | ICD-10-CM | POA: Diagnosis not present

## 2020-01-24 DIAGNOSIS — R110 Nausea: Secondary | ICD-10-CM | POA: Diagnosis not present

## 2020-01-24 DIAGNOSIS — E860 Dehydration: Secondary | ICD-10-CM | POA: Diagnosis not present

## 2020-01-24 DIAGNOSIS — E1165 Type 2 diabetes mellitus with hyperglycemia: Secondary | ICD-10-CM | POA: Diagnosis not present

## 2020-01-24 DIAGNOSIS — M79604 Pain in right leg: Secondary | ICD-10-CM | POA: Diagnosis not present

## 2020-01-24 DIAGNOSIS — R531 Weakness: Secondary | ICD-10-CM | POA: Diagnosis not present

## 2020-01-25 DIAGNOSIS — K649 Unspecified hemorrhoids: Secondary | ICD-10-CM | POA: Diagnosis not present

## 2020-01-25 DIAGNOSIS — I10 Essential (primary) hypertension: Secondary | ICD-10-CM | POA: Diagnosis not present

## 2020-01-25 DIAGNOSIS — G8929 Other chronic pain: Secondary | ICD-10-CM | POA: Diagnosis not present

## 2020-01-25 DIAGNOSIS — Z8546 Personal history of malignant neoplasm of prostate: Secondary | ICD-10-CM | POA: Diagnosis not present

## 2020-01-25 DIAGNOSIS — M1990 Unspecified osteoarthritis, unspecified site: Secondary | ICD-10-CM | POA: Diagnosis not present

## 2020-01-25 DIAGNOSIS — M79661 Pain in right lower leg: Secondary | ICD-10-CM | POA: Diagnosis not present

## 2020-01-25 DIAGNOSIS — E7800 Pure hypercholesterolemia, unspecified: Secondary | ICD-10-CM | POA: Diagnosis not present

## 2020-01-25 DIAGNOSIS — E119 Type 2 diabetes mellitus without complications: Secondary | ICD-10-CM | POA: Diagnosis not present

## 2020-01-25 DIAGNOSIS — R627 Adult failure to thrive: Secondary | ICD-10-CM | POA: Diagnosis not present

## 2020-01-27 DIAGNOSIS — K649 Unspecified hemorrhoids: Secondary | ICD-10-CM | POA: Diagnosis not present

## 2020-01-27 DIAGNOSIS — E119 Type 2 diabetes mellitus without complications: Secondary | ICD-10-CM | POA: Diagnosis not present

## 2020-01-27 DIAGNOSIS — M1990 Unspecified osteoarthritis, unspecified site: Secondary | ICD-10-CM | POA: Diagnosis not present

## 2020-01-27 DIAGNOSIS — E7800 Pure hypercholesterolemia, unspecified: Secondary | ICD-10-CM | POA: Diagnosis not present

## 2020-01-27 DIAGNOSIS — R627 Adult failure to thrive: Secondary | ICD-10-CM | POA: Diagnosis not present

## 2020-01-27 DIAGNOSIS — M79661 Pain in right lower leg: Secondary | ICD-10-CM | POA: Diagnosis not present

## 2020-01-27 DIAGNOSIS — Z8546 Personal history of malignant neoplasm of prostate: Secondary | ICD-10-CM | POA: Diagnosis not present

## 2020-01-27 DIAGNOSIS — E118 Type 2 diabetes mellitus with unspecified complications: Secondary | ICD-10-CM | POA: Diagnosis not present

## 2020-01-27 DIAGNOSIS — I10 Essential (primary) hypertension: Secondary | ICD-10-CM | POA: Diagnosis not present

## 2020-01-27 DIAGNOSIS — G8929 Other chronic pain: Secondary | ICD-10-CM | POA: Diagnosis not present

## 2020-02-01 DIAGNOSIS — Z8546 Personal history of malignant neoplasm of prostate: Secondary | ICD-10-CM | POA: Diagnosis not present

## 2020-02-01 DIAGNOSIS — I10 Essential (primary) hypertension: Secondary | ICD-10-CM | POA: Diagnosis not present

## 2020-02-01 DIAGNOSIS — R627 Adult failure to thrive: Secondary | ICD-10-CM | POA: Diagnosis not present

## 2020-02-01 DIAGNOSIS — M1990 Unspecified osteoarthritis, unspecified site: Secondary | ICD-10-CM | POA: Diagnosis not present

## 2020-02-01 DIAGNOSIS — E7800 Pure hypercholesterolemia, unspecified: Secondary | ICD-10-CM | POA: Diagnosis not present

## 2020-02-01 DIAGNOSIS — K649 Unspecified hemorrhoids: Secondary | ICD-10-CM | POA: Diagnosis not present

## 2020-02-01 DIAGNOSIS — G8929 Other chronic pain: Secondary | ICD-10-CM | POA: Diagnosis not present

## 2020-02-01 DIAGNOSIS — E119 Type 2 diabetes mellitus without complications: Secondary | ICD-10-CM | POA: Diagnosis not present

## 2020-02-01 DIAGNOSIS — M79661 Pain in right lower leg: Secondary | ICD-10-CM | POA: Diagnosis not present

## 2020-02-03 DIAGNOSIS — R627 Adult failure to thrive: Secondary | ICD-10-CM | POA: Diagnosis not present

## 2020-02-03 DIAGNOSIS — M1990 Unspecified osteoarthritis, unspecified site: Secondary | ICD-10-CM | POA: Diagnosis not present

## 2020-02-03 DIAGNOSIS — Z8546 Personal history of malignant neoplasm of prostate: Secondary | ICD-10-CM | POA: Diagnosis not present

## 2020-02-03 DIAGNOSIS — I10 Essential (primary) hypertension: Secondary | ICD-10-CM | POA: Diagnosis not present

## 2020-02-03 DIAGNOSIS — K649 Unspecified hemorrhoids: Secondary | ICD-10-CM | POA: Diagnosis not present

## 2020-02-03 DIAGNOSIS — G8929 Other chronic pain: Secondary | ICD-10-CM | POA: Diagnosis not present

## 2020-02-03 DIAGNOSIS — M79661 Pain in right lower leg: Secondary | ICD-10-CM | POA: Diagnosis not present

## 2020-02-03 DIAGNOSIS — E119 Type 2 diabetes mellitus without complications: Secondary | ICD-10-CM | POA: Diagnosis not present

## 2020-02-03 DIAGNOSIS — E7800 Pure hypercholesterolemia, unspecified: Secondary | ICD-10-CM | POA: Diagnosis not present

## 2020-02-07 DIAGNOSIS — E119 Type 2 diabetes mellitus without complications: Secondary | ICD-10-CM | POA: Diagnosis not present

## 2020-02-07 DIAGNOSIS — R627 Adult failure to thrive: Secondary | ICD-10-CM | POA: Diagnosis not present

## 2020-02-07 DIAGNOSIS — K649 Unspecified hemorrhoids: Secondary | ICD-10-CM | POA: Diagnosis not present

## 2020-02-07 DIAGNOSIS — E7800 Pure hypercholesterolemia, unspecified: Secondary | ICD-10-CM | POA: Diagnosis not present

## 2020-02-07 DIAGNOSIS — Z8546 Personal history of malignant neoplasm of prostate: Secondary | ICD-10-CM | POA: Diagnosis not present

## 2020-02-07 DIAGNOSIS — M79661 Pain in right lower leg: Secondary | ICD-10-CM | POA: Diagnosis not present

## 2020-02-07 DIAGNOSIS — I10 Essential (primary) hypertension: Secondary | ICD-10-CM | POA: Diagnosis not present

## 2020-02-07 DIAGNOSIS — G8929 Other chronic pain: Secondary | ICD-10-CM | POA: Diagnosis not present

## 2020-02-07 DIAGNOSIS — M1990 Unspecified osteoarthritis, unspecified site: Secondary | ICD-10-CM | POA: Diagnosis not present

## 2020-02-08 DIAGNOSIS — M79661 Pain in right lower leg: Secondary | ICD-10-CM | POA: Diagnosis not present

## 2020-02-08 DIAGNOSIS — G8929 Other chronic pain: Secondary | ICD-10-CM | POA: Diagnosis not present

## 2020-02-08 DIAGNOSIS — K649 Unspecified hemorrhoids: Secondary | ICD-10-CM | POA: Diagnosis not present

## 2020-02-08 DIAGNOSIS — R627 Adult failure to thrive: Secondary | ICD-10-CM | POA: Diagnosis not present

## 2020-02-08 DIAGNOSIS — I10 Essential (primary) hypertension: Secondary | ICD-10-CM | POA: Diagnosis not present

## 2020-02-08 DIAGNOSIS — E7800 Pure hypercholesterolemia, unspecified: Secondary | ICD-10-CM | POA: Diagnosis not present

## 2020-02-08 DIAGNOSIS — Z8546 Personal history of malignant neoplasm of prostate: Secondary | ICD-10-CM | POA: Diagnosis not present

## 2020-02-08 DIAGNOSIS — E119 Type 2 diabetes mellitus without complications: Secondary | ICD-10-CM | POA: Diagnosis not present

## 2020-02-08 DIAGNOSIS — M1990 Unspecified osteoarthritis, unspecified site: Secondary | ICD-10-CM | POA: Diagnosis not present

## 2020-02-09 DIAGNOSIS — M1990 Unspecified osteoarthritis, unspecified site: Secondary | ICD-10-CM | POA: Diagnosis not present

## 2020-02-09 DIAGNOSIS — E7800 Pure hypercholesterolemia, unspecified: Secondary | ICD-10-CM | POA: Diagnosis not present

## 2020-02-09 DIAGNOSIS — K649 Unspecified hemorrhoids: Secondary | ICD-10-CM | POA: Diagnosis not present

## 2020-02-09 DIAGNOSIS — M79661 Pain in right lower leg: Secondary | ICD-10-CM | POA: Diagnosis not present

## 2020-02-09 DIAGNOSIS — E119 Type 2 diabetes mellitus without complications: Secondary | ICD-10-CM | POA: Diagnosis not present

## 2020-02-09 DIAGNOSIS — G8929 Other chronic pain: Secondary | ICD-10-CM | POA: Diagnosis not present

## 2020-02-09 DIAGNOSIS — I10 Essential (primary) hypertension: Secondary | ICD-10-CM | POA: Diagnosis not present

## 2020-02-09 DIAGNOSIS — R627 Adult failure to thrive: Secondary | ICD-10-CM | POA: Diagnosis not present

## 2020-02-09 DIAGNOSIS — Z8546 Personal history of malignant neoplasm of prostate: Secondary | ICD-10-CM | POA: Diagnosis not present

## 2020-02-14 DIAGNOSIS — I10 Essential (primary) hypertension: Secondary | ICD-10-CM | POA: Diagnosis not present

## 2020-02-14 DIAGNOSIS — M79661 Pain in right lower leg: Secondary | ICD-10-CM | POA: Diagnosis not present

## 2020-02-14 DIAGNOSIS — M1990 Unspecified osteoarthritis, unspecified site: Secondary | ICD-10-CM | POA: Diagnosis not present

## 2020-02-14 DIAGNOSIS — K649 Unspecified hemorrhoids: Secondary | ICD-10-CM | POA: Diagnosis not present

## 2020-02-14 DIAGNOSIS — Z8546 Personal history of malignant neoplasm of prostate: Secondary | ICD-10-CM | POA: Diagnosis not present

## 2020-02-14 DIAGNOSIS — G8929 Other chronic pain: Secondary | ICD-10-CM | POA: Diagnosis not present

## 2020-02-14 DIAGNOSIS — R627 Adult failure to thrive: Secondary | ICD-10-CM | POA: Diagnosis not present

## 2020-02-14 DIAGNOSIS — E7800 Pure hypercholesterolemia, unspecified: Secondary | ICD-10-CM | POA: Diagnosis not present

## 2020-02-14 DIAGNOSIS — E119 Type 2 diabetes mellitus without complications: Secondary | ICD-10-CM | POA: Diagnosis not present

## 2020-02-15 DIAGNOSIS — K649 Unspecified hemorrhoids: Secondary | ICD-10-CM | POA: Diagnosis not present

## 2020-02-15 DIAGNOSIS — M1990 Unspecified osteoarthritis, unspecified site: Secondary | ICD-10-CM | POA: Diagnosis not present

## 2020-02-15 DIAGNOSIS — M79661 Pain in right lower leg: Secondary | ICD-10-CM | POA: Diagnosis not present

## 2020-02-15 DIAGNOSIS — Z8546 Personal history of malignant neoplasm of prostate: Secondary | ICD-10-CM | POA: Diagnosis not present

## 2020-02-15 DIAGNOSIS — R627 Adult failure to thrive: Secondary | ICD-10-CM | POA: Diagnosis not present

## 2020-02-15 DIAGNOSIS — I10 Essential (primary) hypertension: Secondary | ICD-10-CM | POA: Diagnosis not present

## 2020-02-15 DIAGNOSIS — E7800 Pure hypercholesterolemia, unspecified: Secondary | ICD-10-CM | POA: Diagnosis not present

## 2020-02-15 DIAGNOSIS — E119 Type 2 diabetes mellitus without complications: Secondary | ICD-10-CM | POA: Diagnosis not present

## 2020-02-15 DIAGNOSIS — G8929 Other chronic pain: Secondary | ICD-10-CM | POA: Diagnosis not present

## 2020-02-17 DIAGNOSIS — I10 Essential (primary) hypertension: Secondary | ICD-10-CM | POA: Diagnosis not present

## 2020-02-17 DIAGNOSIS — Z8546 Personal history of malignant neoplasm of prostate: Secondary | ICD-10-CM | POA: Diagnosis not present

## 2020-02-17 DIAGNOSIS — M1990 Unspecified osteoarthritis, unspecified site: Secondary | ICD-10-CM | POA: Diagnosis not present

## 2020-02-17 DIAGNOSIS — E119 Type 2 diabetes mellitus without complications: Secondary | ICD-10-CM | POA: Diagnosis not present

## 2020-02-17 DIAGNOSIS — E7800 Pure hypercholesterolemia, unspecified: Secondary | ICD-10-CM | POA: Diagnosis not present

## 2020-02-17 DIAGNOSIS — K649 Unspecified hemorrhoids: Secondary | ICD-10-CM | POA: Diagnosis not present

## 2020-02-17 DIAGNOSIS — M79661 Pain in right lower leg: Secondary | ICD-10-CM | POA: Diagnosis not present

## 2020-02-17 DIAGNOSIS — G8929 Other chronic pain: Secondary | ICD-10-CM | POA: Diagnosis not present

## 2020-02-17 DIAGNOSIS — R627 Adult failure to thrive: Secondary | ICD-10-CM | POA: Diagnosis not present

## 2020-02-19 DIAGNOSIS — E119 Type 2 diabetes mellitus without complications: Secondary | ICD-10-CM | POA: Diagnosis not present

## 2020-02-19 DIAGNOSIS — M1990 Unspecified osteoarthritis, unspecified site: Secondary | ICD-10-CM | POA: Diagnosis not present

## 2020-02-19 DIAGNOSIS — K649 Unspecified hemorrhoids: Secondary | ICD-10-CM | POA: Diagnosis not present

## 2020-02-19 DIAGNOSIS — E7800 Pure hypercholesterolemia, unspecified: Secondary | ICD-10-CM | POA: Diagnosis not present

## 2020-02-19 DIAGNOSIS — M79661 Pain in right lower leg: Secondary | ICD-10-CM | POA: Diagnosis not present

## 2020-02-19 DIAGNOSIS — I10 Essential (primary) hypertension: Secondary | ICD-10-CM | POA: Diagnosis not present

## 2020-02-19 DIAGNOSIS — G8929 Other chronic pain: Secondary | ICD-10-CM | POA: Diagnosis not present

## 2020-02-19 DIAGNOSIS — Z8546 Personal history of malignant neoplasm of prostate: Secondary | ICD-10-CM | POA: Diagnosis not present

## 2020-02-19 DIAGNOSIS — R627 Adult failure to thrive: Secondary | ICD-10-CM | POA: Diagnosis not present

## 2020-02-21 DIAGNOSIS — K649 Unspecified hemorrhoids: Secondary | ICD-10-CM | POA: Diagnosis not present

## 2020-02-21 DIAGNOSIS — E119 Type 2 diabetes mellitus without complications: Secondary | ICD-10-CM | POA: Diagnosis not present

## 2020-02-21 DIAGNOSIS — Z8546 Personal history of malignant neoplasm of prostate: Secondary | ICD-10-CM | POA: Diagnosis not present

## 2020-02-21 DIAGNOSIS — M1990 Unspecified osteoarthritis, unspecified site: Secondary | ICD-10-CM | POA: Diagnosis not present

## 2020-02-21 DIAGNOSIS — I10 Essential (primary) hypertension: Secondary | ICD-10-CM | POA: Diagnosis not present

## 2020-02-21 DIAGNOSIS — G8929 Other chronic pain: Secondary | ICD-10-CM | POA: Diagnosis not present

## 2020-02-21 DIAGNOSIS — R627 Adult failure to thrive: Secondary | ICD-10-CM | POA: Diagnosis not present

## 2020-02-21 DIAGNOSIS — E7800 Pure hypercholesterolemia, unspecified: Secondary | ICD-10-CM | POA: Diagnosis not present

## 2020-02-21 DIAGNOSIS — M79661 Pain in right lower leg: Secondary | ICD-10-CM | POA: Diagnosis not present

## 2020-02-22 DIAGNOSIS — M1990 Unspecified osteoarthritis, unspecified site: Secondary | ICD-10-CM | POA: Diagnosis not present

## 2020-02-22 DIAGNOSIS — I10 Essential (primary) hypertension: Secondary | ICD-10-CM | POA: Diagnosis not present

## 2020-02-22 DIAGNOSIS — M79661 Pain in right lower leg: Secondary | ICD-10-CM | POA: Diagnosis not present

## 2020-02-22 DIAGNOSIS — E119 Type 2 diabetes mellitus without complications: Secondary | ICD-10-CM | POA: Diagnosis not present

## 2020-02-22 DIAGNOSIS — R627 Adult failure to thrive: Secondary | ICD-10-CM | POA: Diagnosis not present

## 2020-02-22 DIAGNOSIS — G8929 Other chronic pain: Secondary | ICD-10-CM | POA: Diagnosis not present

## 2020-02-22 DIAGNOSIS — E7800 Pure hypercholesterolemia, unspecified: Secondary | ICD-10-CM | POA: Diagnosis not present

## 2020-02-22 DIAGNOSIS — K649 Unspecified hemorrhoids: Secondary | ICD-10-CM | POA: Diagnosis not present

## 2020-02-22 DIAGNOSIS — Z8546 Personal history of malignant neoplasm of prostate: Secondary | ICD-10-CM | POA: Diagnosis not present

## 2020-02-25 DIAGNOSIS — M79661 Pain in right lower leg: Secondary | ICD-10-CM | POA: Diagnosis not present

## 2020-02-25 DIAGNOSIS — E7800 Pure hypercholesterolemia, unspecified: Secondary | ICD-10-CM | POA: Diagnosis not present

## 2020-02-25 DIAGNOSIS — Z8546 Personal history of malignant neoplasm of prostate: Secondary | ICD-10-CM | POA: Diagnosis not present

## 2020-02-25 DIAGNOSIS — G8929 Other chronic pain: Secondary | ICD-10-CM | POA: Diagnosis not present

## 2020-02-25 DIAGNOSIS — I10 Essential (primary) hypertension: Secondary | ICD-10-CM | POA: Diagnosis not present

## 2020-02-25 DIAGNOSIS — M1990 Unspecified osteoarthritis, unspecified site: Secondary | ICD-10-CM | POA: Diagnosis not present

## 2020-02-25 DIAGNOSIS — E119 Type 2 diabetes mellitus without complications: Secondary | ICD-10-CM | POA: Diagnosis not present

## 2020-02-25 DIAGNOSIS — R627 Adult failure to thrive: Secondary | ICD-10-CM | POA: Diagnosis not present

## 2020-02-25 DIAGNOSIS — K649 Unspecified hemorrhoids: Secondary | ICD-10-CM | POA: Diagnosis not present

## 2020-03-01 DIAGNOSIS — R627 Adult failure to thrive: Secondary | ICD-10-CM | POA: Diagnosis not present

## 2020-03-01 DIAGNOSIS — M1990 Unspecified osteoarthritis, unspecified site: Secondary | ICD-10-CM | POA: Diagnosis not present

## 2020-03-01 DIAGNOSIS — K649 Unspecified hemorrhoids: Secondary | ICD-10-CM | POA: Diagnosis not present

## 2020-03-01 DIAGNOSIS — I10 Essential (primary) hypertension: Secondary | ICD-10-CM | POA: Diagnosis not present

## 2020-03-01 DIAGNOSIS — E119 Type 2 diabetes mellitus without complications: Secondary | ICD-10-CM | POA: Diagnosis not present

## 2020-03-01 DIAGNOSIS — E7800 Pure hypercholesterolemia, unspecified: Secondary | ICD-10-CM | POA: Diagnosis not present

## 2020-03-01 DIAGNOSIS — G8929 Other chronic pain: Secondary | ICD-10-CM | POA: Diagnosis not present

## 2020-03-01 DIAGNOSIS — M79661 Pain in right lower leg: Secondary | ICD-10-CM | POA: Diagnosis not present

## 2020-03-01 DIAGNOSIS — Z8546 Personal history of malignant neoplasm of prostate: Secondary | ICD-10-CM | POA: Diagnosis not present

## 2020-03-08 DIAGNOSIS — I10 Essential (primary) hypertension: Secondary | ICD-10-CM | POA: Diagnosis not present

## 2020-03-08 DIAGNOSIS — R627 Adult failure to thrive: Secondary | ICD-10-CM | POA: Diagnosis not present

## 2020-03-08 DIAGNOSIS — Z8546 Personal history of malignant neoplasm of prostate: Secondary | ICD-10-CM | POA: Diagnosis not present

## 2020-03-08 DIAGNOSIS — E119 Type 2 diabetes mellitus without complications: Secondary | ICD-10-CM | POA: Diagnosis not present

## 2020-03-08 DIAGNOSIS — K649 Unspecified hemorrhoids: Secondary | ICD-10-CM | POA: Diagnosis not present

## 2020-03-08 DIAGNOSIS — M79661 Pain in right lower leg: Secondary | ICD-10-CM | POA: Diagnosis not present

## 2020-03-08 DIAGNOSIS — M1990 Unspecified osteoarthritis, unspecified site: Secondary | ICD-10-CM | POA: Diagnosis not present

## 2020-03-08 DIAGNOSIS — E7800 Pure hypercholesterolemia, unspecified: Secondary | ICD-10-CM | POA: Diagnosis not present

## 2020-03-08 DIAGNOSIS — G8929 Other chronic pain: Secondary | ICD-10-CM | POA: Diagnosis not present

## 2020-03-14 DIAGNOSIS — E1165 Type 2 diabetes mellitus with hyperglycemia: Secondary | ICD-10-CM | POA: Diagnosis not present

## 2020-03-14 DIAGNOSIS — Z79899 Other long term (current) drug therapy: Secondary | ICD-10-CM | POA: Diagnosis not present

## 2020-03-14 DIAGNOSIS — M79605 Pain in left leg: Secondary | ICD-10-CM | POA: Diagnosis not present

## 2020-03-14 DIAGNOSIS — I10 Essential (primary) hypertension: Secondary | ICD-10-CM | POA: Diagnosis not present

## 2020-03-14 DIAGNOSIS — E789 Disorder of lipoprotein metabolism, unspecified: Secondary | ICD-10-CM | POA: Diagnosis not present

## 2020-03-15 DIAGNOSIS — E7800 Pure hypercholesterolemia, unspecified: Secondary | ICD-10-CM | POA: Diagnosis not present

## 2020-03-15 DIAGNOSIS — I10 Essential (primary) hypertension: Secondary | ICD-10-CM | POA: Diagnosis not present

## 2020-03-15 DIAGNOSIS — G8929 Other chronic pain: Secondary | ICD-10-CM | POA: Diagnosis not present

## 2020-03-15 DIAGNOSIS — Z8546 Personal history of malignant neoplasm of prostate: Secondary | ICD-10-CM | POA: Diagnosis not present

## 2020-03-15 DIAGNOSIS — K649 Unspecified hemorrhoids: Secondary | ICD-10-CM | POA: Diagnosis not present

## 2020-03-15 DIAGNOSIS — E119 Type 2 diabetes mellitus without complications: Secondary | ICD-10-CM | POA: Diagnosis not present

## 2020-03-15 DIAGNOSIS — M79661 Pain in right lower leg: Secondary | ICD-10-CM | POA: Diagnosis not present

## 2020-03-15 DIAGNOSIS — M1990 Unspecified osteoarthritis, unspecified site: Secondary | ICD-10-CM | POA: Diagnosis not present

## 2020-03-15 DIAGNOSIS — R627 Adult failure to thrive: Secondary | ICD-10-CM | POA: Diagnosis not present

## 2020-03-21 ENCOUNTER — Other Ambulatory Visit: Payer: Self-pay

## 2020-03-21 ENCOUNTER — Inpatient Hospital Stay (HOSPITAL_COMMUNITY)
Admission: EM | Admit: 2020-03-21 | Discharge: 2020-03-25 | DRG: 638 | Disposition: A | Discharge status: Skilled Nursing Facility | Payer: Medicare HMO | Attending: Internal Medicine | Admitting: Internal Medicine

## 2020-03-21 ENCOUNTER — Emergency Department (HOSPITAL_COMMUNITY): Payer: Medicare HMO

## 2020-03-21 ENCOUNTER — Encounter (HOSPITAL_COMMUNITY): Payer: Self-pay | Admitting: Internal Medicine

## 2020-03-21 DIAGNOSIS — M2550 Pain in unspecified joint: Secondary | ICD-10-CM | POA: Diagnosis not present

## 2020-03-21 DIAGNOSIS — E7800 Pure hypercholesterolemia, unspecified: Secondary | ICD-10-CM

## 2020-03-21 DIAGNOSIS — R531 Weakness: Secondary | ICD-10-CM | POA: Diagnosis not present

## 2020-03-21 DIAGNOSIS — M79661 Pain in right lower leg: Secondary | ICD-10-CM | POA: Diagnosis not present

## 2020-03-21 DIAGNOSIS — E1165 Type 2 diabetes mellitus with hyperglycemia: Secondary | ICD-10-CM | POA: Diagnosis not present

## 2020-03-21 DIAGNOSIS — R000 Tachycardia, unspecified: Secondary | ICD-10-CM | POA: Diagnosis not present

## 2020-03-21 DIAGNOSIS — Z20822 Contact with and (suspected) exposure to covid-19: Secondary | ICD-10-CM | POA: Diagnosis present

## 2020-03-21 DIAGNOSIS — E860 Dehydration: Secondary | ICD-10-CM | POA: Diagnosis present

## 2020-03-21 DIAGNOSIS — I10 Essential (primary) hypertension: Secondary | ICD-10-CM | POA: Diagnosis not present

## 2020-03-21 DIAGNOSIS — Z794 Long term (current) use of insulin: Secondary | ICD-10-CM | POA: Diagnosis present

## 2020-03-21 DIAGNOSIS — Z7401 Bed confinement status: Secondary | ICD-10-CM | POA: Diagnosis not present

## 2020-03-21 DIAGNOSIS — Z79899 Other long term (current) drug therapy: Secondary | ICD-10-CM

## 2020-03-21 DIAGNOSIS — Z043 Encounter for examination and observation following other accident: Secondary | ICD-10-CM | POA: Diagnosis not present

## 2020-03-21 DIAGNOSIS — R54 Age-related physical debility: Secondary | ICD-10-CM | POA: Diagnosis not present

## 2020-03-21 DIAGNOSIS — Z8546 Personal history of malignant neoplasm of prostate: Secondary | ICD-10-CM | POA: Diagnosis not present

## 2020-03-21 DIAGNOSIS — W19XXXA Unspecified fall, initial encounter: Secondary | ICD-10-CM | POA: Diagnosis not present

## 2020-03-21 DIAGNOSIS — N179 Acute kidney failure, unspecified: Secondary | ICD-10-CM | POA: Diagnosis not present

## 2020-03-21 DIAGNOSIS — M47816 Spondylosis without myelopathy or radiculopathy, lumbar region: Secondary | ICD-10-CM | POA: Diagnosis not present

## 2020-03-21 DIAGNOSIS — E1110 Type 2 diabetes mellitus with ketoacidosis without coma: Secondary | ICD-10-CM | POA: Diagnosis not present

## 2020-03-21 DIAGNOSIS — E0810 Diabetes mellitus due to underlying condition with ketoacidosis without coma: Secondary | ICD-10-CM | POA: Diagnosis not present

## 2020-03-21 DIAGNOSIS — M1990 Unspecified osteoarthritis, unspecified site: Secondary | ICD-10-CM | POA: Diagnosis present

## 2020-03-21 DIAGNOSIS — Z9079 Acquired absence of other genital organ(s): Secondary | ICD-10-CM | POA: Diagnosis not present

## 2020-03-21 DIAGNOSIS — E785 Hyperlipidemia, unspecified: Secondary | ICD-10-CM | POA: Diagnosis not present

## 2020-03-21 DIAGNOSIS — Z87440 Personal history of urinary (tract) infections: Secondary | ICD-10-CM | POA: Diagnosis not present

## 2020-03-21 DIAGNOSIS — E119 Type 2 diabetes mellitus without complications: Secondary | ICD-10-CM | POA: Diagnosis not present

## 2020-03-21 DIAGNOSIS — Z87891 Personal history of nicotine dependence: Secondary | ICD-10-CM

## 2020-03-21 DIAGNOSIS — K219 Gastro-esophageal reflux disease without esophagitis: Secondary | ICD-10-CM | POA: Diagnosis present

## 2020-03-21 DIAGNOSIS — W06XXXA Fall from bed, initial encounter: Secondary | ICD-10-CM | POA: Diagnosis present

## 2020-03-21 DIAGNOSIS — C61 Malignant neoplasm of prostate: Secondary | ICD-10-CM | POA: Diagnosis present

## 2020-03-21 DIAGNOSIS — Z9181 History of falling: Secondary | ICD-10-CM | POA: Diagnosis not present

## 2020-03-21 DIAGNOSIS — Z791 Long term (current) use of non-steroidal anti-inflammatories (NSAID): Secondary | ICD-10-CM

## 2020-03-21 DIAGNOSIS — M6281 Muscle weakness (generalized): Secondary | ICD-10-CM | POA: Diagnosis not present

## 2020-03-21 DIAGNOSIS — M160 Bilateral primary osteoarthritis of hip: Secondary | ICD-10-CM | POA: Diagnosis not present

## 2020-03-21 DIAGNOSIS — R0902 Hypoxemia: Secondary | ICD-10-CM | POA: Diagnosis not present

## 2020-03-21 LAB — CBC WITH DIFFERENTIAL/PLATELET
Abs Immature Granulocytes: 0.06 10*3/uL (ref 0.00–0.07)
Basophils Absolute: 0 10*3/uL (ref 0.0–0.1)
Basophils Relative: 0 %
Eosinophils Absolute: 0 10*3/uL (ref 0.0–0.5)
Eosinophils Relative: 0 %
HCT: 39.9 % (ref 39.0–52.0)
Hemoglobin: 12.5 g/dL — ABNORMAL LOW (ref 13.0–17.0)
Immature Granulocytes: 0 %
Lymphocytes Relative: 21 %
Lymphs Abs: 2.8 10*3/uL (ref 0.7–4.0)
MCH: 29 pg (ref 26.0–34.0)
MCHC: 31.3 g/dL (ref 30.0–36.0)
MCV: 92.6 fL (ref 80.0–100.0)
Monocytes Absolute: 0.6 10*3/uL (ref 0.1–1.0)
Monocytes Relative: 5 %
Neutro Abs: 10 10*3/uL — ABNORMAL HIGH (ref 1.7–7.7)
Neutrophils Relative %: 74 %
Platelets: 234 10*3/uL (ref 150–400)
RBC: 4.31 MIL/uL (ref 4.22–5.81)
RDW: 14.3 % (ref 11.5–15.5)
WBC: 13.5 10*3/uL — ABNORMAL HIGH (ref 4.0–10.5)
nRBC: 0 % (ref 0.0–0.2)

## 2020-03-21 LAB — HEMOGLOBIN A1C
Hgb A1c MFr Bld: 6.2 % — ABNORMAL HIGH (ref 4.8–5.6)
Mean Plasma Glucose: 131.24 mg/dL

## 2020-03-21 LAB — URINALYSIS, ROUTINE W REFLEX MICROSCOPIC
Bacteria, UA: NONE SEEN
Bilirubin Urine: NEGATIVE
Glucose, UA: >=500 mg/dL — AB
Hgb urine dipstick: NEGATIVE
Ketones, ur: 80 mg/dL — AB
Leukocytes,Ua: NEGATIVE
Nitrite: NEGATIVE
Protein, ur: NEGATIVE mg/dL
Specific Gravity, Urine: 1.02 (ref 1.005–1.030)
pH: 5 (ref 5.0–8.0)

## 2020-03-21 LAB — CBG MONITORING, ED
Glucose-Capillary: 273 mg/dL — ABNORMAL HIGH (ref 70–99)
Glucose-Capillary: 198 mg/dL — ABNORMAL HIGH (ref 70–99)
Glucose-Capillary: 184 mg/dL — ABNORMAL HIGH (ref 70–99)
Glucose-Capillary: 153 mg/dL — ABNORMAL HIGH (ref 70–99)
Glucose-Capillary: 159 mg/dL — ABNORMAL HIGH (ref 70–99)
Glucose-Capillary: 186 mg/dL — ABNORMAL HIGH (ref 70–99)

## 2020-03-21 LAB — BASIC METABOLIC PANEL
Anion gap: 25 — ABNORMAL HIGH (ref 5–15)
Anion gap: 17 — ABNORMAL HIGH (ref 5–15)
BUN: 25 mg/dL — ABNORMAL HIGH (ref 8–23)
BUN: 24 mg/dL — ABNORMAL HIGH (ref 8–23)
CO2: 15 mmol/L — ABNORMAL LOW (ref 22–32)
CO2: 21 mmol/L — ABNORMAL LOW (ref 22–32)
Calcium: 9.1 mg/dL (ref 8.9–10.3)
Calcium: 9 mg/dL (ref 8.9–10.3)
Chloride: 100 mmol/L (ref 98–111)
Chloride: 102 mmol/L (ref 98–111)
Creatinine, Ser: 2 mg/dL — ABNORMAL HIGH (ref 0.61–1.24)
Creatinine, Ser: 1.7 mg/dL — ABNORMAL HIGH (ref 0.61–1.24)
GFR calc Af Amer: 38 mL/min — ABNORMAL LOW (ref >60)
GFR calc Af Amer: 46 mL/min — ABNORMAL LOW (ref >60)
GFR calc non Af Amer: 32 mL/min — ABNORMAL LOW (ref >60)
GFR calc non Af Amer: 39 mL/min — ABNORMAL LOW (ref >60)
Glucose, Bld: 253 mg/dL — ABNORMAL HIGH (ref 70–99)
Glucose, Bld: 172 mg/dL — ABNORMAL HIGH (ref 70–99)
Potassium: 4.5 mmol/L (ref 3.5–5.1)
Potassium: 5 mmol/L (ref 3.5–5.1)
Sodium: 140 mmol/L (ref 135–145)
Sodium: 140 mmol/L (ref 135–145)

## 2020-03-21 LAB — COMPREHENSIVE METABOLIC PANEL
ALT: 16 U/L (ref 0–44)
AST: 33 U/L (ref 15–41)
Albumin: 3.8 g/dL (ref 3.5–5.0)
Alkaline Phosphatase: 80 U/L (ref 38–126)
Anion gap: 30 — ABNORMAL HIGH (ref 5–15)
BUN: 24 mg/dL — ABNORMAL HIGH (ref 8–23)
CO2: 14 mmol/L — ABNORMAL LOW (ref 22–32)
Calcium: 9.8 mg/dL (ref 8.9–10.3)
Chloride: 96 mmol/L — ABNORMAL LOW (ref 98–111)
Creatinine, Ser: 2.17 mg/dL — ABNORMAL HIGH (ref 0.61–1.24)
GFR calc Af Amer: 34 mL/min — ABNORMAL LOW (ref >60)
GFR calc non Af Amer: 29 mL/min — ABNORMAL LOW (ref >60)
Glucose, Bld: 290 mg/dL — ABNORMAL HIGH (ref 70–99)
Potassium: 5.6 mmol/L — ABNORMAL HIGH (ref 3.5–5.1)
Sodium: 140 mmol/L (ref 135–145)
Total Bilirubin: 3.2 mg/dL — ABNORMAL HIGH (ref 0.3–1.2)
Total Protein: 6.8 g/dL (ref 6.5–8.1)

## 2020-03-21 LAB — SARS CORONAVIRUS 2 BY RT PCR (HOSPITAL ORDER, PERFORMED IN ~~LOC~~ HOSPITAL LAB): SARS Coronavirus 2: NEGATIVE

## 2020-03-21 LAB — LIPASE, BLOOD: Lipase: 19 U/L (ref 11–51)

## 2020-03-21 LAB — CK: Total CK: 96 U/L (ref 49–397)

## 2020-03-21 LAB — BETA-HYDROXYBUTYRIC ACID
Beta-Hydroxybutyric Acid: >8 mmol/L — ABNORMAL HIGH (ref 0.05–0.27)
Beta-Hydroxybutyric Acid: 5.53 mmol/L — ABNORMAL HIGH (ref 0.05–0.27)

## 2020-03-21 MED ORDER — SODIUM CHLORIDE 0.9 % IV BOLUS
1000.0000 mL | Freq: Once | INTRAVENOUS | Status: AC
  Administered 2020-03-21: 1000 mL via INTRAVENOUS

## 2020-03-21 MED ORDER — METFORMIN HCL ER 500 MG PO TB24: 500.0000 mg | ORAL_TABLET | Freq: Every day | ORAL | Status: DC

## 2020-03-21 MED ORDER — VITAMIN D 25 MCG (1000 UNIT) PO TABS
1000.0000 [IU] | ORAL_TABLET | Freq: Every day | ORAL | Status: DC
  Administered 2020-03-21 – 2020-03-24 (×4): 1000 [IU] via ORAL
  Filled 2020-03-21 (×4): qty 1

## 2020-03-21 MED ORDER — LACTATED RINGERS IV BOLUS
1000.0000 mL | Freq: Once | INTRAVENOUS | Status: AC
  Administered 2020-03-21: 1000 mL via INTRAVENOUS

## 2020-03-21 MED ORDER — INSULIN REGULAR(HUMAN) IN NACL 100-0.9 UT/100ML-% IV SOLN
INTRAVENOUS | Status: DC
  Administered 2020-03-21: 9.5 [IU]/h via INTRAVENOUS
  Filled 2020-03-21: qty 100

## 2020-03-21 MED ORDER — ATORVASTATIN CALCIUM 10 MG PO TABS
10.0000 mg | ORAL_TABLET | Freq: Every day | ORAL | Status: DC
  Administered 2020-03-21 – 2020-03-24 (×4): 10 mg via ORAL
  Filled 2020-03-21 (×4): qty 1

## 2020-03-21 MED ORDER — DEXTROSE IN LACTATED RINGERS 5 % IV SOLN: INTRAVENOUS | Status: DC

## 2020-03-21 MED ORDER — ENOXAPARIN SODIUM 40 MG/0.4ML ~~LOC~~ SOLN
40.0000 mg | SUBCUTANEOUS | Status: DC
  Administered 2020-03-21 – 2020-03-24 (×3): 40 mg via SUBCUTANEOUS
  Filled 2020-03-21 (×3): qty 0.4

## 2020-03-21 MED ORDER — AMLODIPINE BESYLATE 10 MG PO TABS
10.0000 mg | ORAL_TABLET | Freq: Every day | ORAL | Status: DC
  Administered 2020-03-21 – 2020-03-24 (×4): 10 mg via ORAL
  Filled 2020-03-21 (×2): qty 1
  Filled 2020-03-21 (×2): qty 2

## 2020-03-21 MED ORDER — ALUM & MAG HYDROXIDE-SIMETH 200-200-20 MG/5ML PO SUSP
30.0000 mL | Freq: Once | ORAL | Status: AC
  Administered 2020-03-21: 30 mL via ORAL
  Filled 2020-03-21: qty 30

## 2020-03-21 MED ORDER — DEXTROSE 50 % IV SOLN: 0.0000 mL | INTRAVENOUS | Status: DC | PRN

## 2020-03-21 MED ORDER — HYDROCODONE-ACETAMINOPHEN 7.5-325 MG PO TABS
1.0000 | ORAL_TABLET | Freq: Three times a day (TID) | ORAL | Status: DC | PRN
  Administered 2020-03-22 – 2020-03-24 (×6): 1 via ORAL
  Filled 2020-03-21 (×7): qty 1

## 2020-03-21 MED ORDER — IRBESARTAN 300 MG PO TABS
300.0000 mg | ORAL_TABLET | Freq: Every day | ORAL | Status: DC
  Filled 2020-03-21: qty 1

## 2020-03-21 MED ORDER — INSULIN GLARGINE 100 UNIT/ML ~~LOC~~ SOLN
9.0000 [IU] | Freq: Every day | SUBCUTANEOUS | Status: DC
  Filled 2020-03-21: qty 0.09

## 2020-03-21 MED ORDER — LACTATED RINGERS IV BOLUS
1000.0000 mL | INTRAVENOUS | Status: AC
  Administered 2020-03-21 (×2): 1000 mL via INTRAVENOUS

## 2020-03-21 MED ORDER — LACTATED RINGERS IV SOLN: INTRAVENOUS | Status: DC

## 2020-03-21 MED ORDER — SEMAGLUTIDE (1 MG/DOSE) 2 MG/1.5ML ~~LOC~~ SOPN: 1.0000 mg | PEN_INJECTOR | SUBCUTANEOUS | Status: DC

## 2020-03-21 NOTE — ED Provider Notes (Signed)
[facility] EMERGENCY DEPARTMENT Provider Note   CSN: [id number] Arrival date & time: 03/21/20  1139     History Chief Complaint  Patient presents with  . Fall    Jay Holloway is a 74 y.o. male.  HPI    Patient presents with concern of weakness, epigastric pain.  Patient acknowledges multiple medical problems, including recent stay at a rehabilitation facility due to leg fracture.  He has been home for several weeks, but notes that after transiently improving he has been progressively weaker. Yesterday, at least 12 hours ago the patient slid off his bed while trying to stand.  He spent the subsequent time on the ground, unable to get assistance, or upright. He denies substantial fall, trauma to any particular area, pain in any particular area, though he does note some discomfort in his epigastrium. He denies confusion, acknowledges weakness, denies asymmetry of that weakness. With persistent weakness, concern for prolonged downtime he presents for evaluation via EMS. Past Medical History:  Diagnosis Date  . Arthritis    trouble turning head side to side and up  . Diabetes mellitus without complication ([place])   . Hemorrhoids   . High cholesterol   . Hypertension   . Prostate cancer [facility])     Patient Active Problem List   Diagnosis Date Noted  . Sepsis ([place]) 11/08/2014  . Arterial hypotension   . Renal failure   . Type 2 diabetes mellitus with ketoacidosis without coma ([place])   . Weakness   . Bacteremia   . Blood poisoning   . Urinary tract infectious disease   . DKA (diabetic ketoacidoses) ([place]) 11/05/2014    Past Surgical History:  Procedure Laterality Date  . CHOLECYSTECTOMY N/A 11/11/2014   Procedure: LAPAROSCOPIC CHOLECYSTECTOMY WITH INTRAOPERATIVE CHOLANGIOGRAM;  Surgeon: Erroll Luna, MD;  Location: [place];  Service: General;  Laterality: N/A;  . COLONOSCOPY N/A 11/09/2014   Procedure: COLONOSCOPY;  Surgeon: Juanita Craver, MD;  Location: [facility]  ENDOSCOPY;  Service: Endoscopy;  Laterality: N/A;  . CYSTOSCOPY WITH RETROGRADE PYELOGRAM, URETEROSCOPY AND STENT PLACEMENT Left 04/18/2014   Procedure: CYSTOSCOPY WITH RETROGRADE PYELOGRAM, URETEROSCOPY AND STENT PLACEMENT;  Surgeon: Raynelle Bring, MD;  Location[facility];  Service: Urology;  Laterality: Left;  . HOLMIUM LASER APPLICATION Left 04/01/91   Procedure: HOLMIUM LASER APPLICATION;  Surgeon: Raynelle Bring, MD;  Location[facility];  Service: Urology;  Laterality: Left;  . LYMPHADENECTOMY  06/04/2012   Procedure: LYMPHADENECTOMY;  Surgeon: Dutch Gray, MD;  [facility];  Service: Urology;  Laterality: Bilateral;  . right  achilles tendon repair  yrs ago  . ROBOT ASSISTED LAPAROSCOPIC RADICAL PROSTATECTOMY  06/04/2012   Procedure: ROBOTIC ASSISTED LAPAROSCOPIC RADICAL PROSTATECTOMY LEVEL 3;  Surgeon: Dutch Gray, MD;  [facility];  Service: Urology;  Laterality: N/A;       No family history on file.  Social History   Tobacco Use  . Smoking status: Former Smoker    Packs/day: 0.25    Years: 4.00    Pack years: 1.00    Types: Cigarettes    Quit date: 12/16/1978    Years since quitting: 41.2  . Smokeless tobacco: Never Used  Substance Use Topics  . Alcohol use: No  . Drug use: No    Home Medications Prior to Admission medications   Medication Sig Start Date End Date Taking? Authorizing Provider  acetaminophen-codeine (TYLENOL #3) 300-30 MG tablet 1 po q 8 hrs prn pain Patient not taking: Reported on 12/24/2019 10/08/17  Meredith Pel, MD  acetaminophen-codeine (TYLENOL #3) 300-30 MG tablet 1 po q 12 hrs prn pain Patient not taking: Reported on 12/24/2019 10/20/17   Meredith Pel, MD  amLODipine (NORVASC) 10 MG tablet Take 10 mg by mouth daily.    [provider]  atorvastatin (LIPITOR) 10 MG tablet Take 10 mg by mouth daily.  04/21/18   [provider]  BD PEN NEEDLE NANO U/F 32G X 4 MM MISC  09/21/14   [provider]  Cholecalciferol  (VITAMIN D3) 25 MCG (1000 UT) CAPS Take 1 capsule by mouth daily.  10/15/18   [provider]  HYDROcodone-acetaminophen (NORCO) 7.5-325 MG tablet Take 1 tablet by mouth 3 (three) times daily as needed for moderate pain or severe pain.  11/26/19   [provider]  insulin glargine (LANTUS) 100 UNIT/ML injection Inject 9 Units into the skin at bedtime.     [provider]  irbesartan (AVAPRO) 300 MG tablet Take 300 mg by mouth daily. 10/28/14   [provider]  meloxicam (MOBIC) 7.5 MG tablet Take 1 tablet (7.5 mg total) by mouth daily. Patient not taking: Reported on 12/24/2019 09/25/17   Ok Edwards, PA-C  metFORMIN (GLUCOPHAGE-XR) 500 MG 24 hr tablet Take 500 mg by mouth in the morning and at bedtime.  10/15/18   [provider]  Multiple Vitamins-Minerals (MULTIVITAMIN ADULTS) TABS Take 1 tablet by mouth daily.    [provider]  NAPROXEN DR 500 MG EC tablet Take 500 mg by mouth 2 (two) times daily as needed (pain).  04/21/18   [provider]  Semaglutide (OZEMPIC, 1 MG/DOSE, [place]) Inject 1 mg into the skin once a week. Wed    [provider]    Allergies    Patient has no known allergies.  Review of Systems   Review of Systems  Constitutional:       Per HPI, otherwise negative  HENT:       Per HPI, otherwise negative  Respiratory:       Per HPI, otherwise negative  Cardiovascular:       Per HPI, otherwise negative  Gastrointestinal: Negative for vomiting.  Endocrine:       Negative aside from HPI  Genitourinary:       Neg aside from HPI   Musculoskeletal:       Per HPI, otherwise negative  Skin: Negative.   Neurological: Positive for weakness. Negative for syncope.    Physical Exam Updated Vital Signs BP 135/80   Pulse (!) 117   Temp (!) 97.3 F (36.3 C) (Oral)   Resp (!) 24   Ht 6\' 3"  (1.905 m)   Wt 95.7 kg   SpO2 97%   BMI 26.37 kg/m   Physical Exam Vitals and nursing note reviewed.  Constitutional:       General: He is not in acute distress.    Appearance: He is well-developed. He is ill-appearing. He is not toxic-appearing.     Comments: Mechele Claude elderly male speaking slowly, but clearly.  HENT:     Head: Normocephalic and atraumatic.  Eyes:     Conjunctiva/sclera: Conjunctivae normal.  Cardiovascular:     Rate and Rhythm: Regular rhythm. Tachycardia present.  Pulmonary:     Effort: Pulmonary effort is normal. No respiratory distress.     Breath sounds: No stridor.  Abdominal:     General: There is no distension.  Musculoskeletal:        General: No deformity or  signs of injury.     Right lower leg: Edema present.     Left lower leg: Edema present.  Skin:    General: Skin is warm and dry.  Neurological:     Mental Status: He is alert and oriented to person, place, and time.     Cranial Nerves: Cranial nerves are intact.     Motor: Atrophy present.     ED Results / Procedures / Treatments   Labs (all labs ordered are listed, but only abnormal results are displayed) Labs Reviewed  COMPREHENSIVE METABOLIC PANEL - Abnormal; Notable for the following components:      Result Value   Potassium 5.6 (*)    Chloride 96 (*)    CO2 14 (*)    Glucose, Bld 290 (*)    BUN 24 (*)    Creatinine, Ser 2.17 (*)    Total Bilirubin 3.2 (*)    GFR calc non Af Amer 29 (*)    GFR calc Af Amer 34 (*)    Anion gap 30 (*)    All other components within normal limits  URINALYSIS, ROUTINE W REFLEX MICROSCOPIC - Abnormal; Notable for the following components:   Glucose, UA >=500 (*)    Ketones, ur 80 (*)    All other components within normal limits  CK  LIPASE, BLOOD  CBC WITH DIFFERENTIAL/PLATELET   EKG EKG Interpretation  Date/Time:  Tuesday March 21 2020 12:28:54 EDT Ventricular Rate:  116 PR Interval:    QRS Duration: 135 QT Interval:  334 QTC Calculation: 464 R Axis:   -70 Text Interpretation: Sinus or ectopic atrial tachycardia RBBB and LAFB ST-t wave abnormality  Baseline wander similar to prior study Abnormal ECG Confirmed by Carmin Muskrat [phone]) on 03/21/2020 2:50:01 PM   Radiology DG Pelvis Portable  Result Date: 03/21/2020 CLINICAL DATA:  Pelvic pain after fall EXAM: PORTABLE PELVIS 1-2 VIEWS COMPARISON:  11/10/2014 FINDINGS: There is no evidence of pelvic fracture or diastasis. Moderate arthropathy of the bilateral hips. Degenerative changes within the visualized lower lumbar spine with syndesmophytes and interspinous ligament ossification suggesting ankylosing spondylitis. IMPRESSION: 1. No acute osseous abnormality. 2. 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 Electronically Signed   By: Davina Poke D.O.   On: 03/21/2020 12:28   DG Chest Port 1 View  Result Date: 03/21/2020 CLINICAL DATA:  Weakness, fall EXAM: PORTABLE CHEST 1 VIEW COMPARISON:  12/24/2019 FINDINGS: The heart size and mediastinal contours are within normal limits. Chronic elevation of the right hemidiaphragm with associated right basilar scarring. No new focal airspace consolidation. No pleural effusion or pneumothorax. Degenerative changes of the shoulders, left worse than right. The visualized skeletal structures are unremarkable. IMPRESSION: 1. No active cardiopulmonary disease. 2. Chronic elevation of the right hemidiaphragm with associated right basilar scarring. Electronically Signed   By: Davina Poke D.O.   On: 03/21/2020 12:35    Procedures Procedures (including critical care time)  Medications Ordered in ED Medications  amLODipine (NORVASC) tablet 10 mg (has no administration in time range)  atorvastatin (LIPITOR) tablet 10 mg (has no administration in time range)  cholecalciferol (VITAMIN D3) tablet 1,000 Units (has no administration in time range)  HYDROcodone-acetaminophen (NORCO) 7.5-325 MG per tablet 1 tablet (has no administration in time range)  insulin glargine (LANTUS) injection 9 Units (has no administration in time range)    sodium chloride 0.9 % bolus 1,000 mL (0 mLs Intravenous Stopped 03/21/20 1446)  alum & mag hydroxide-simeth (MAALOX/MYLANTA) 200-200-20 MG/5ML suspension 30 mL (30 mLs  Oral Given 03/21/20 1436)  lactated ringers bolus 1,000 mL (1,000 mLs Intravenous Bolus from Bag 03/21/20 1505)    ED Course  I have reviewed the triage vital signs and the nursing notes.  Pertinent labs & imaging results that were available during my care of the patient were reviewed by me and considered in my medical decision making (see chart for details).   After initial evaluation consideration of dehydration versus rhabdomyolysis versus infection with consideration of fractures following fall patient had x-rays, labs ordered.  IV fluids started empirically.  Update:, After initial resuscitation patient has mild epigastric discomfort, but this is essentially resolved from arrival following provision of GI cocktail.  After initial fluid resuscitation patient remains tachycardic, but states that he feels somewhat better. Have discussed his case with our social work Medical laboratory scientific officer for eventual discharge planning.   3:13 PM Labs notable for AKI.  Urinalysis notable for substantial ketone urea. After ongoing fluids, patient continues to have tachycardia, global weakness, though x-rays do not demonstrate fractures.  I discussed the case with our pharmacy colleagues given his AKI, ongoing meds that could exacerbate this condition.  Metformin, irbesartan held.  Given his AKI, weakness, fall, I discussed this case with social work, as above, physical therapy consult has been placed, the patient will be admitted for further resuscitation, monitoring, management. Final Clinical Impression(s) / ED Diagnoses Final diagnoses:  None    Rx / DC Orders ED Discharge Orders    None       Carmin Muskrat, MD 03/21/20 1530

## 2020-03-21 NOTE — H&P (Signed)
History and Physical    Olander Friedl KYH:[id number] DOB: Dec 14, 1945 DOA: 03/21/2020  PCP: [name], MD Consultants:  Elisha Ponder - podiatry; Marlou Sa - orthopedics Patient coming from:  Home - lives alone; NOK: Vonna Kotyk [place], [place], [place]  Chief Complaint: fall  HPI: [place] is a 74 y.o. male with medical history significant of prostate CA; HTN; HLD; and DM presenting after he fell out of bed overnight and was unable to get up.  He slipped off his bed and was unable to get up.  He has been feeling weak.  He recently was discharged from rehab (no documentation about this is available at this time) and was doing pretty well but then noticed that he couldn't walk again.  +polyuria.  He did not injure himself when he fell.  He called his nephew and he came to help, but he remained in the floor all night long.  He has not been taking good PO intake, none in 2 days because he didn't feel like it.  He was taken off his insulin by his PCP 2 days ago.    ED Course:  Fall, weakness, AKI, failing at home.  Decreased PO intake, meds likely led to AKI.  Labs unremarkable otherwise.  PT is seeing now.  Likely needs rehab.   Review of Systems: As per HPI; otherwise review of systems reviewed and negative.   Ambulatory Status:  Ambulates with a cane  COVID Vaccine Status:   Complete  Past Medical History:  Diagnosis Date  . Arthritis    trouble turning head side to side and up  . Diabetes mellitus without complication ([place])   . Hemorrhoids   . High cholesterol   . Hypertension   . Prostate cancer [facility])     Past Surgical History:  Procedure Laterality Date  . CHOLECYSTECTOMY N/A 11/11/2014   Procedure: LAPAROSCOPIC CHOLECYSTECTOMY WITH INTRAOPERATIVE CHOLANGIOGRAM;  Surgeon: Erroll Luna, MD;  Location: [place];  Service: General;  Laterality: N/A;  . COLONOSCOPY N/A 11/09/2014   Procedure: COLONOSCOPY;  Surgeon: Juanita Craver, MD;  Location: [facility] ENDOSCOPY;  Service: Endoscopy;   Laterality: N/A;  . CYSTOSCOPY WITH RETROGRADE PYELOGRAM, URETEROSCOPY AND STENT PLACEMENT Left 04/18/2014   Procedure: CYSTOSCOPY WITH RETROGRADE PYELOGRAM, URETEROSCOPY AND STENT PLACEMENT;  Surgeon: Raynelle Bring, MD;  [facility];  Service: Urology;  Laterality: Left;  . HOLMIUM LASER APPLICATION Left 1/51/7616   Procedure: HOLMIUM LASER APPLICATION;  Surgeon: Raynelle Bring, MD;  [facility];  Service: Urology;  Laterality: Left;  . LYMPHADENECTOMY  06/04/2012   Procedure: LYMPHADENECTOMY;  Surgeon: Dutch Gray, MD;  [facility];  Service: Urology;  Laterality: Bilateral;  . right  achilles tendon repair  yrs ago  . ROBOT ASSISTED LAPAROSCOPIC RADICAL PROSTATECTOMY  06/04/2012   Procedure: ROBOTIC ASSISTED LAPAROSCOPIC RADICAL PROSTATECTOMY LEVEL 3;  Surgeon: Dutch Gray, MD;  [facility];  Service: Urology;  Laterality: N/A;    Social History   Socioeconomic History  . Marital status: Single    Spouse name: Not on file  . Number of children: Not on file  . Years of education: Not on file  . Highest education level: Not on file  Occupational History  . Occupation: retired  Tobacco Use  . Smoking status: Former Smoker    Packs/day: 0.25    Years: 4.00    Pack years: 1.00    Types: Cigarettes    Quit date: 12/16/1978    Years since quitting: 41.2  . Smokeless tobacco: Never Used  Substance and Sexual Activity  . Alcohol use: Not Currently    Comment: remote h/o heavy use  . Drug use: Not Currently    Types: Marijuana    Comment: 1970s  . Sexual activity: Not on file  Other Topics Concern  . Not on file  Social History Narrative  . Not on file   Social Determinants of Health   Financial Resource Strain:   . Difficulty of Paying Living Expenses: Not on file  Food Insecurity:   . Worried About Charity fundraiser in the Last Year: Not on file  . Ran Out of Food in the Last Year: Not on file  Transportation Needs:   . Lack of Transportation (Medical):  Not on file  . Lack of Transportation (Non-Medical): Not on file  Physical Activity:   . Days of Exercise per Week: Not on file  . Minutes of Exercise per Session: Not on file  Stress:   . Feeling of Stress : Not on file  Social Connections:   . Frequency of Communication with Friends and Family: Not on file  . Frequency of Social Gatherings with Friends and Family: Not on file  . Attends Religious Services: Not on file  . Active Member of Clubs or Organizations: Not on file  . Attends Archivist Meetings: Not on file  . Marital Status: Not on file  Intimate Partner Violence:   . Fear of Current or Ex-Partner: Not on file  . Emotionally Abused: Not on file  . Physically Abused: Not on file  . Sexually Abused: Not on file    No Known Allergies  History reviewed. No pertinent family history.  Prior to Admission medications   Medication Sig Start Date End Date Taking? Authorizing Provider  acetaminophen-codeine (TYLENOL #3) 300-30 MG tablet 1 po q 8 hrs prn pain Patient not taking: Reported on 12/24/2019 10/08/17   Meredith Pel, MD  acetaminophen-codeine (TYLENOL #3) 300-30 MG tablet 1 po q 12 hrs prn pain Patient not taking: Reported on 12/24/2019 10/20/17   Meredith Pel, MD  amLODipine (NORVASC) 10 MG tablet Take 10 mg by mouth daily.    [provider]  atorvastatin (LIPITOR) 10 MG tablet Take 10 mg by mouth daily.  04/21/18   [provider]  BD PEN NEEDLE NANO U/F 32G X 4 MM MISC  09/21/14   [provider]  Cholecalciferol (VITAMIN D3) 25 MCG (1000 UT) CAPS Take 1 capsule by mouth daily.  10/15/18   [provider]  HYDROcodone-acetaminophen (NORCO) 7.5-325 MG tablet Take 1 tablet by mouth 3 (three) times daily as needed for moderate pain or severe pain.  11/26/19   [provider]  insulin glargine (LANTUS) 100 UNIT/ML injection Inject 9 Units into the skin at bedtime.     [provider]  irbesartan  (AVAPRO) 300 MG tablet Take 300 mg by mouth daily. 10/28/14   [provider]  meloxicam (MOBIC) 7.5 MG tablet Take 1 tablet (7.5 mg total) by mouth daily. Patient not taking: Reported on 12/24/2019 09/25/17   Ok Edwards, PA-C  metFORMIN (GLUCOPHAGE-XR) 500 MG 24 hr tablet Take 500 mg by mouth in the morning and at bedtime.  10/15/18   [provider]  Multiple Vitamins-Minerals (MULTIVITAMIN ADULTS) TABS Take 1 tablet by mouth daily.    [provider]  NAPROXEN DR 500 MG EC tablet Take 500 mg by mouth 2 (two) times daily as needed (pain).  04/21/18   [provider]  Semaglutide (OZEMPIC, 1 MG/DOSE, [place]) Inject 1 mg into the skin once a week. Wed    [provider]    Physical Exam: Vitals:   03/21/20 1715 03/21/20 1730 03/21/20 1745 03/21/20 1800  BP: 128/75 126/76 124/76 121/76  Pulse: (!) 117 (!) 116 (!) 115 (!) 114  Resp: 18 19 20 17   Temp:      TempSrc:      SpO2: 96% 95% 97% 97%  Weight:      Height:         . General:  Appears ill, weak, fatigued . Eyes:  PERRL, EOMI, normal lids, iris . ENT:  grossly normal hearing, lips & tongue, dry mm . Neck:  no LAD, masses or thyromegaly . Cardiovascular:  RR with tachycardia, no m/r/g. No LE edema.  Marland Kitchen Respiratory:   CTA bilaterally with no wheezes/rales/rhonchi.  Normal respiratory effort. . Abdomen:  soft, NT, ND, NABS . Skin:  no rash or induration seen on limited exam . Musculoskeletal:  grossly normal tone BUE/BLE, good ROM, no bony abnormality . Psychiatric:  flat mood and affect, speech fluent and appropriate, AOx3 . Neurologic:  CN 2-12 grossly intact, moves all extremities in coordinated fashion    Radiological Exams on Admission: DG Pelvis Portable  Result Date: 03/21/2020 CLINICAL DATA:  Pelvic pain after fall EXAM: PORTABLE PELVIS 1-2 VIEWS COMPARISON:  11/10/2014 FINDINGS: There is no evidence of pelvic fracture or diastasis. Moderate arthropathy of the bilateral hips.  Degenerative changes within the visualized lower lumbar spine with syndesmophytes and interspinous ligament ossification suggesting ankylosing spondylitis. IMPRESSION: 1. No acute osseous abnormality. 2. 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 Electronically Signed   By: Davina Poke D.O.   On: 03/21/2020 12:28   DG Chest Port 1 View  Result Date: 03/21/2020 CLINICAL DATA:  Weakness, fall EXAM: PORTABLE CHEST 1 VIEW COMPARISON:  12/24/2019 FINDINGS: The heart size and mediastinal contours are within normal limits. Chronic elevation of the right hemidiaphragm with associated right basilar scarring. No new focal airspace consolidation. No pleural effusion or pneumothorax. Degenerative changes of the shoulders, left worse than right. The visualized skeletal structures are unremarkable. IMPRESSION: 1. No active cardiopulmonary disease. 2. Chronic elevation of the right hemidiaphragm with associated right basilar scarring. Electronically Signed   By: Davina Poke D.O.   On: 03/21/2020 12:35    EKG: Independently reviewed.  Sinus tachycardia with rate 116; nonspecific ST changes with no evidence of acute ischemia   Labs on Admission: I have personally reviewed the available labs and imaging studies at the time of the admission.  Pertinent labs:   K+ 5.6 CO2 14 Glucose 290 BUN 24/Creatinine 2.17/GFR 29 Anion gap 30 Bili 3.2 WBC 13.5 Hgb 12.5 CK 96 UA: >500 glucose, 80 ketones Beta-hydroxybutyrate >8 COVID pending   Assessment/Plan Active Problems:   Type 2 diabetes mellitus with ketoacidosis without coma (HCC)   Weakness   Prostate cancer ([facility])   Hypertension   High cholesterol   AKI (acute kidney injury) ([place])   DKA -Patient with good baseline control but who was reportedly taken off insulin by his PCP 2 days ago, now presenting with weakness and found to be in DKA -No indication of illness as source -Moderate DKA on admission based on HCO3 14,  patient alert but mildly drowsy, anion gap >12 -Will observe in SDU with DKA protocol -Would recommend continuing insulin drip at least until morning regardless of rapidity of closure of gap and normalization of labs -K+  slightly increased at time of presentation and so potassium supplementation not added -IVF at 150 cc/hr, LR until glucose <250 and then decrease rate to 125 and change to D5LR -Consult to diabetes coordinator  AKI -Appears to be associated with profound dehydration in the setting of DKA -IVF -Follow up renal function by BMP  Weakness -Patient previously required SNF placement for rehab and lives alone - so may need to go back -However, he is currently in DKA and so may rebound once no longer seriously ill -PT/OT consults requested  Prostate CA -remote, 2013 s/p radical prostatectomy  HTN -Continue Norvasc -Hold Avapro  HLD -Continue Lipitor    Note: This patient has been tested and is pending for the novel coronavirus COVID-19.  DVT prophylaxis:   Lovenox  Code Status:  Full - confirmed with patient Family Communication: None present Disposition Plan:  The patient is from: home  Anticipated d/c is to: home with [facility] services vs. SNF Anticipated d/c date will depend on clinical response to treatment, but possibly as early as tomorrow if he has excellent response to treatment  Patient is currently: acutely ill Consults called: PT/Diabetes coordinator/TOC team Admission status:  It is my clinical opinion that referral for OBSERVATION is reasonable and necessary in this patient based on the above information provided. The aforementioned taken together are felt to place the patient at high risk for further clinical deterioration. However it is anticipated that the patient may be medically stable for discharge from the hospital within 24 to 48 hours.    Karmen Bongo MD [facility]   How to contact the [facility] Attending or Consulting provider [place] or  covering provider during after hours [place], for this patient?  1. Check the care team in [facility] and look for a) attending/consulting TRH provider listed and b) the [facility] team listed 2. Log into [email or web address] and use [place]'s universal password to access. If you do not have the password, please contact the hospital operator. 3. Locate the [facility] provider you are looking for under [facility] and page to a number that you can be directly reached. 4. If you still have difficulty reaching the provider, please page the [facility] (Director on Call) for the Hospitalists listed on amion for assistance.   03/21/2020, 7:27 PM

## 2020-03-21 NOTE — Evaluation (Signed)
Physical Therapy Evaluation Patient Details Name: Jay Holloway MRN: [id number] DOB: Jun 15, 1946 Today's Date: 03/21/2020   History of Present Illness  Pt is a 74 y/o male admitted secondary to fall from bed. Also found to be dehydrated. PMH includes HTN, prostate cancer, DM.   Clinical Impression  Pt admitted secondary to problem above with deficits below. Pt requiring mod A to sit at EOB. Reporting lightheadedness upon sitting (BP at 117/76). Further mobility deferred given symptoms and unsafe to attempt further from higher stretcher height. Pt currently lives alone and is at increased risk for falls. Will continue to follow acutely to maximize functional mobility independence and safety.     Follow Up Recommendations SNF;Supervision/Assistance - 24 hour    Equipment Recommendations  None recommended by PT    Recommendations for Other Services       Precautions / Restrictions Precautions Precautions: Fall Restrictions Weight Bearing Restrictions: No      Mobility  Bed Mobility Overal bed mobility: Needs Assistance Bed Mobility: Supine to Sit;Sit to Supine;Rolling Rolling: Mod assist   Supine to sit: Mod assist Sit to supine: Mod assist   General bed mobility comments: Mod A for trunk assist and LE assist to come to bed mobility. Pt reporting increased wooziness in sitting, so further mobility deferred. BP at 117/76 mmHG. Mod A to roll from side to side for clean up as pt had urinated in bed.   Transfers                    Ambulation/Gait                Stairs            Wheelchair Mobility    Modified Rankin (Stroke Patients Only)       Balance Overall balance assessment: Needs assistance Sitting-balance support: No upper extremity supported Sitting balance-Leahy Scale: Fair                                       Pertinent Vitals/Pain Pain Assessment: No/denies pain    Home Living Family/patient expects to be discharged  to:: Private residence Living Arrangements: Alone Available Help at Discharge: Friend(s);Available PRN/intermittently Type of Home: House Home Access: Stairs to enter Entrance Stairs-Rails: Left Entrance Stairs-Number of Steps: 3 Home Layout: One level Home Equipment: Walker - 2 wheels;Cane - single point;Grab bars - tub/shower      Prior Function Level of Independence: Needs assistance   Gait / Transfers Assistance Needed: Uses RW vs cane depending on the day. Reports that he sometimes needs help, then reports he helps himself.   ADL's / Homemaking Assistance Needed: Reports independence with ADLs        Hand Dominance        Extremity/Trunk Assessment   Upper Extremity Assessment Upper Extremity Assessment: Generalized weakness    Lower Extremity Assessment Lower Extremity Assessment: Generalized weakness    Cervical / Trunk Assessment Cervical / Trunk Assessment: Kyphotic  Communication   Communication: No difficulties  Cognition Arousal/Alertness: Awake/alert Behavior During Therapy: WFL for tasks assessed/performed Overall Cognitive Status: No family/caregiver present to determine baseline cognitive functioning                                 General Comments: Pt reporting conflicting information      General Comments  Exercises     Assessment/Plan    PT Assessment Patient needs continued PT services  PT Problem List Decreased strength;Decreased range of motion;Decreased activity tolerance;Decreased balance;Decreased mobility;Decreased coordination;Decreased cognition;Decreased knowledge of use of DME;Decreased safety awareness;Decreased knowledge of precautions       PT Treatment Interventions Gait training;DME instruction;Functional mobility training;Therapeutic activities;Therapeutic exercise;Balance training;Patient/family education    PT Goals (Current goals can be found in the Care Plan section)  Acute Rehab PT Goals Patient  Stated Goal: none stated PT Goal Formulation: With patient Time For Goal Achievement: 04/04/20 Potential to Achieve Goals: Fair    Frequency Min 2X/week   Barriers to discharge        Co-evaluation               AM-PAC PT "6 Clicks" Mobility  Outcome Measure Help needed turning from your back to your side while in a flat bed without using bedrails?: A Lot Help needed moving from lying on your back to sitting on the side of a flat bed without using bedrails?: A Lot Help needed moving to and from a bed to a chair (including a wheelchair)?: A Lot Help needed standing up from a chair using your arms (e.g., wheelchair or bedside chair)?: A Lot Help needed to walk in hospital room?: A Lot Help needed climbing 3-5 steps with a railing? : Total 6 Click Score: 11    End of Session   Activity Tolerance: Treatment limited secondary to medical complications (Comment) (dizziness) Patient left: in bed;with call bell/phone within reach (on stretcher in ED ) Nurse Communication: Mobility status PT Visit Diagnosis: Other abnormalities of gait and mobility (R26.89);Difficulty in walking, not elsewhere classified (R26.2);History of falling (Z91.81);Repeated falls (R29.6)    Time: 2197-5883 PT Time Calculation (min) (ACUTE ONLY): 23 min   Charges:   PT Evaluation $PT Eval Moderate Complexity: 1 Mod PT Treatments $Therapeutic Activity: 8-22 mins        Lou Miner, DPT  Acute Rehabilitation Services  Pager: [phone] Office: [phone]   Rudean Hitt 03/21/2020, 6:29 PM

## 2020-03-21 NOTE — Social Work (Addendum)
CSW met with Pt at bedside. Pt reported heartburn pain and showed signs of distress. CSW informed RN. CSW will speak to Pt again after pain is managed. Awaiting results from PT consult to continue with d/c planning.

## 2020-03-21 NOTE — Progress Notes (Signed)
CSW faxed SNF referrals to several area SNFs excluding [facility] at the specific request of the Pt.  Pt has not been medically cleared for discharge as of this writing, but remains in Obs status.

## 2020-03-21 NOTE — ED Triage Notes (Signed)
Pt arrived via EMS from home after "slipping out of bed" at home last night and was unable to get back up. Pts nephew found the pt in his home lying on the floor when he came to visit this morning. Pt denies hitting his head or loc, denies blood thinner use. Pt is c/o nausea at this time.

## 2020-03-21 NOTE — NC FL2 (Addendum)
[place] MEDICAID FL2 LEVEL OF CARE SCREENING TOOL     IDENTIFICATION  Patient Name: Jay Holloway Birthdate: [birth year] Sex: male Admission Date (Current Location): 03/21/2020  [facility] and [place] Number:  Herbalist and Address:  [facility], [place] [address], [place], [place] [address]      Provider Number: [id number]  Attending Physician Name and Address:  Karmen Bongo, MD  Relative Name and Phone Number:  Oneita Kras   [phone]    Current Level of Care: Hospital Recommended Level of Care: [place] Prior Approval Number:    Date Approved/Denied:   PASRR Number: [id number]  Discharge Plan: SNF    Current Diagnoses: Patient Active Problem List   Diagnosis Date Noted  . DKA (diabetic ketoacidosis) ([place]) 03/21/2020  . Sepsis ([place]) 11/08/2014  . Arterial hypotension   . Renal failure   . Type 2 diabetes mellitus with ketoacidosis without coma ([place])   . Weakness   . Bacteremia   . Blood poisoning   . Urinary tract infectious disease   . DKA (diabetic ketoacidoses) ([place]) 11/05/2014    Orientation RESPIRATION BLADDER Height & Weight     Self, Time, Situation, Place  Normal Continent Weight: 211 lb (95.7 kg) Height:  6\' 3"  (190.5 cm)  BEHAVIORAL SYMPTOMS/MOOD NEUROLOGICAL BOWEL NUTRITION STATUS      Continent Diet (Low Carb)  AMBULATORY STATUS COMMUNICATION OF NEEDS Skin   Extensive Assist Verbally Normal                       Personal Care Assistance Level of Assistance  Bathing, Feeding, Dressing Bathing Assistance: Limited assistance Feeding assistance: Independent Dressing Assistance: Limited assistance     Functional Limitations Info  Sight, Hearing, Speech Sight Info: Adequate Hearing Info: Adequate Speech Info: Adequate    SPECIAL CARE FACTORS FREQUENCY  PT (By licensed PT), OT (By licensed OT)     PT Frequency: 5x weekly OT Frequency: 5x weekly             Contractures Contractures Info: Not present    Additional Factors Info  Code Status Code Status Info: Full             Current Medications (03/21/2020):  This is the current hospital active medication list Current Facility-Administered Medications  Medication Dose Route Frequency Provider Last Rate Last Admin  . amLODipine (NORVASC) tablet 10 mg  10 mg Oral Daily Carmin Muskrat, MD   10 mg at 03/21/20 1648  . atorvastatin (LIPITOR) tablet 10 mg  10 mg Oral Daily Carmin Muskrat, MD   10 mg at 03/21/20 1648  . cholecalciferol (VITAMIN D3) tablet 1,000 Units  1,000 Units Oral Daily Carmin Muskrat, MD   1,000 Units at 03/21/20 1648  . dextrose 5 % in lactated ringers infusion   Intravenous Continuous Karmen Bongo, MD 125 mL/hr at 03/21/20 1806 Bolus from Bag at 03/21/20 1806  . dextrose 50 % solution 0-50 mL  0-50 mL Intravenous PRN Karmen Bongo, MD      . enoxaparin (LOVENOX) injection 40 mg  40 mg Subcutaneous Q24H Karmen Bongo, MD   40 mg at 03/21/20 1640  . HYDROcodone-acetaminophen (NORCO) 7.5-325 MG per tablet 1 tablet  1 tablet Oral TID PRN Carmin Muskrat, MD      . insulin regular, human (MYXREDLIN) 100 units/ 100 mL infusion   Intravenous Continuous Karmen Bongo, MD 3.6 mL/hr at 03/21/20 1759 3.6 Units/hr at 03/21/20 1759  . lactated ringers  infusion   Intravenous Continuous Karmen Bongo, MD   Stopped at 03/21/20 1800   Current Outpatient Medications  Medication Sig Dispense Refill  . amLODipine (NORVASC) 10 MG tablet Take 10 mg by mouth daily.    Marland Kitchen atorvastatin (LIPITOR) 10 MG tablet Take 10 mg by mouth daily.     . BD PEN NEEDLE NANO U/F 32G X 4 MM MISC   0  . Cholecalciferol (VITAMIN D3) 25 MCG (1000 UT) CAPS Take 1 capsule by mouth daily.     . furosemide (LASIX) 40 MG tablet Take 40 mg by mouth daily as needed. fluid    . HYDROcodone-acetaminophen (NORCO) 7.5-325 MG tablet Take 1 tablet by mouth 3 (three) times daily as needed for moderate pain or  severe pain.     . metFORMIN (GLUCOPHAGE-XR) 500 MG 24 hr tablet Take 500 mg by mouth in the morning and at bedtime.     . Multiple Vitamins-Minerals (MULTIVITAMIN ADULTS) TABS Take 1 tablet by mouth daily.    . Semaglutide (OZEMPIC, 1 MG/DOSE, [place]) Inject 1 mg into the skin once a week. Wed    . insulin glargine (LANTUS) 100 UNIT/ML injection Inject 9 Units into the skin at bedtime.  (Patient not taking: Reported on 03/21/2020)    . irbesartan (AVAPRO) 300 MG tablet Take 300 mg by mouth daily. (Patient not taking: Reported on 03/21/2020)  1  . NAPROXEN DR 500 MG EC tablet Take 500 mg by mouth 2 (two) times daily as needed (pain).  (Patient not taking: Reported on 03/21/2020)       Discharge Medications: Please see discharge summary for a list of discharge medications.  Relevant Imaging Results:  Relevant Lab Results:   Additional Information SSN: [id number]  Vergie Living, LCSW

## 2020-03-22 ENCOUNTER — Observation Stay (HOSPITAL_COMMUNITY): Payer: Medicare HMO

## 2020-03-22 DIAGNOSIS — Z79899 Other long term (current) drug therapy: Secondary | ICD-10-CM | POA: Diagnosis not present

## 2020-03-22 DIAGNOSIS — Z9079 Acquired absence of other genital organ(s): Secondary | ICD-10-CM | POA: Diagnosis not present

## 2020-03-22 DIAGNOSIS — M1990 Unspecified osteoarthritis, unspecified site: Secondary | ICD-10-CM | POA: Diagnosis present

## 2020-03-22 DIAGNOSIS — E7800 Pure hypercholesterolemia, unspecified: Secondary | ICD-10-CM | POA: Diagnosis present

## 2020-03-22 DIAGNOSIS — N179 Acute kidney failure, unspecified: Secondary | ICD-10-CM | POA: Diagnosis present

## 2020-03-22 DIAGNOSIS — Z794 Long term (current) use of insulin: Secondary | ICD-10-CM | POA: Diagnosis not present

## 2020-03-22 DIAGNOSIS — K219 Gastro-esophageal reflux disease without esophagitis: Secondary | ICD-10-CM | POA: Diagnosis present

## 2020-03-22 DIAGNOSIS — Z87891 Personal history of nicotine dependence: Secondary | ICD-10-CM | POA: Diagnosis not present

## 2020-03-22 DIAGNOSIS — E860 Dehydration: Secondary | ICD-10-CM | POA: Diagnosis present

## 2020-03-22 DIAGNOSIS — W06XXXA Fall from bed, initial encounter: Secondary | ICD-10-CM | POA: Diagnosis present

## 2020-03-22 DIAGNOSIS — I10 Essential (primary) hypertension: Secondary | ICD-10-CM | POA: Diagnosis present

## 2020-03-22 DIAGNOSIS — M79661 Pain in right lower leg: Secondary | ICD-10-CM | POA: Diagnosis not present

## 2020-03-22 DIAGNOSIS — Z8546 Personal history of malignant neoplasm of prostate: Secondary | ICD-10-CM | POA: Diagnosis not present

## 2020-03-22 DIAGNOSIS — E1110 Type 2 diabetes mellitus with ketoacidosis without coma: Secondary | ICD-10-CM | POA: Diagnosis present

## 2020-03-22 DIAGNOSIS — E785 Hyperlipidemia, unspecified: Secondary | ICD-10-CM | POA: Diagnosis present

## 2020-03-22 DIAGNOSIS — E119 Type 2 diabetes mellitus without complications: Secondary | ICD-10-CM | POA: Diagnosis not present

## 2020-03-22 DIAGNOSIS — Z87440 Personal history of urinary (tract) infections: Secondary | ICD-10-CM | POA: Diagnosis not present

## 2020-03-22 DIAGNOSIS — R54 Age-related physical debility: Secondary | ICD-10-CM | POA: Diagnosis present

## 2020-03-22 DIAGNOSIS — E0810 Diabetes mellitus due to underlying condition with ketoacidosis without coma: Secondary | ICD-10-CM | POA: Diagnosis not present

## 2020-03-22 DIAGNOSIS — Z20822 Contact with and (suspected) exposure to covid-19: Secondary | ICD-10-CM | POA: Diagnosis present

## 2020-03-22 DIAGNOSIS — Z791 Long term (current) use of non-steroidal anti-inflammatories (NSAID): Secondary | ICD-10-CM | POA: Diagnosis not present

## 2020-03-22 LAB — CBG MONITORING, ED
Glucose-Capillary: 120 mg/dL — ABNORMAL HIGH (ref 70–99)
Glucose-Capillary: 144 mg/dL — ABNORMAL HIGH (ref 70–99)
Glucose-Capillary: 146 mg/dL — ABNORMAL HIGH (ref 70–99)
Glucose-Capillary: 153 mg/dL — ABNORMAL HIGH (ref 70–99)
Glucose-Capillary: 157 mg/dL — ABNORMAL HIGH (ref 70–99)
Glucose-Capillary: 160 mg/dL — ABNORMAL HIGH (ref 70–99)
Glucose-Capillary: 165 mg/dL — ABNORMAL HIGH (ref 70–99)
Glucose-Capillary: 171 mg/dL — ABNORMAL HIGH (ref 70–99)
Glucose-Capillary: 172 mg/dL — ABNORMAL HIGH (ref 70–99)
Glucose-Capillary: 173 mg/dL — ABNORMAL HIGH (ref 70–99)
Glucose-Capillary: 192 mg/dL — ABNORMAL HIGH (ref 70–99)

## 2020-03-22 LAB — BASIC METABOLIC PANEL
Anion gap: 12 (ref 5–15)
Anion gap: 10 (ref 5–15)
BUN: 31 mg/dL — ABNORMAL HIGH (ref 8–23)
BUN: 34 mg/dL — ABNORMAL HIGH (ref 8–23)
CO2: 25 mmol/L (ref 22–32)
CO2: 26 mmol/L (ref 22–32)
Calcium: 9 mg/dL (ref 8.9–10.3)
Calcium: 9.2 mg/dL (ref 8.9–10.3)
Chloride: 104 mmol/L (ref 98–111)
Chloride: 105 mmol/L (ref 98–111)
Creatinine, Ser: 1.45 mg/dL — ABNORMAL HIGH (ref 0.61–1.24)
Creatinine, Ser: 1.2 mg/dL (ref 0.61–1.24)
GFR calc Af Amer: 55 mL/min — ABNORMAL LOW (ref >60)
GFR calc Af Amer: >60 mL/min (ref >60)
GFR calc non Af Amer: 48 mL/min — ABNORMAL LOW (ref >60)
GFR calc non Af Amer: >60 mL/min (ref >60)
Glucose, Bld: 180 mg/dL — ABNORMAL HIGH (ref 70–99)
Glucose, Bld: 148 mg/dL — ABNORMAL HIGH (ref 70–99)
Potassium: 4.4 mmol/L (ref 3.5–5.1)
Potassium: 4.4 mmol/L (ref 3.5–5.1)
Sodium: 141 mmol/L (ref 135–145)
Sodium: 141 mmol/L (ref 135–145)

## 2020-03-22 LAB — BETA-HYDROXYBUTYRIC ACID: Beta-Hydroxybutyric Acid: 1.47 mmol/L — ABNORMAL HIGH (ref 0.05–0.27)

## 2020-03-22 MED ORDER — INSULIN GLARGINE 100 UNIT/ML ~~LOC~~ SOLN
10.0000 [IU] | Freq: Every day | SUBCUTANEOUS | Status: DC
  Administered 2020-03-22 – 2020-03-24 (×3): 10 [IU] via SUBCUTANEOUS
  Filled 2020-03-22 (×5): qty 0.1

## 2020-03-22 MED ORDER — PANTOPRAZOLE SODIUM 40 MG PO TBEC
40.0000 mg | DELAYED_RELEASE_TABLET | Freq: Two times a day (BID) | ORAL | Status: DC
  Administered 2020-03-22 – 2020-03-24 (×5): 40 mg via ORAL
  Filled 2020-03-22 (×5): qty 1

## 2020-03-22 MED ORDER — SODIUM CHLORIDE 0.9 % IV SOLN: INTRAVENOUS | Status: DC

## 2020-03-22 MED ORDER — STARCH (THICKENING) PO POWD
ORAL | Status: DC | PRN
  Filled 2020-03-22: qty 227

## 2020-03-22 MED ORDER — INSULIN ASPART 100 UNIT/ML ~~LOC~~ SOLN
0.0000 [IU] | SUBCUTANEOUS | Status: DC
  Administered 2020-03-22 – 2020-03-23 (×2): 3 [IU] via SUBCUTANEOUS
  Administered 2020-03-23: 2 [IU] via SUBCUTANEOUS
  Administered 2020-03-23: 3 [IU] via SUBCUTANEOUS
  Administered 2020-03-23: 11 [IU] via SUBCUTANEOUS
  Administered 2020-03-24: 5 [IU] via SUBCUTANEOUS
  Administered 2020-03-24: 2 [IU] via SUBCUTANEOUS
  Administered 2020-03-24 (×2): 3 [IU] via SUBCUTANEOUS

## 2020-03-22 NOTE — Progress Notes (Signed)
PROGRESS NOTE    Jay Holloway  TMH:[id number] DOB: 1945/08/15 DOA: 03/21/2020 PCP: [name], MD   Brief Narrative:   HPI: Jay Holloway is a 74 y.o. male with medical history significant of prostate CA; HTN; HLD; and DM presenting after he fell out of bed overnight and was unable to get up.  He slipped off his bed and was unable to get up.  He has been feeling weak.  He recently was discharged from rehab (no documentation about this is available at this time) and was doing pretty well but then noticed that he couldn't walk again.  +polyuria.  He did not injure himself when he fell.  He called his nephew and he came to help, but he remained in the floor all night long.  He has not been taking good PO intake, none in 2 days because he didn't feel like it.  He was taken off his insulin by his PCP 2 days ago.    ED Course:  Fall, weakness, AKI, failing at home.  Decreased PO intake, meds likely led to AKI.  Labs unremarkable otherwise.  PT is seeing now.  Likely needs rehab.   Assessment & Plan:   Active Problems:   DKA (diabetic ketoacidoses) ([place])   Type 2 diabetes mellitus with ketoacidosis without coma (HCC)   Weakness   Prostate cancer (HCC)   Hypertension   High cholesterol   AKI (acute kidney injury) ([place])   DKA in type 2 diabetes mellitus:  -Patient with good baseline control but who was reportedly taken off insulin by his PCP 2 days ago presented with DKA.  Started on insulin and IV fluids which was then switched to dextrose.  DKA has resolved.  Anion gap is closed.  Hemoglobin A1c only 6.2.  DKA likely due to poor p.o. intake.  Started on Lantus 10 units this morning.  Advised RN to stop dextrose and insulin drip 2 hours after that and start on normal saline.  Check BMP daily and start on SSI.  AKI: Likely secondary to dehydration due to DKA.  Resolved.  Generalized weakness: -Patient previously required SNF placement for rehab and lives alone.  He seems to be too  weak.  He was initially opposed to considering the option for SNF but after more discussion, he told me that he did not like the beds at the previous nursing home that he was at but he will consider going to a different SNF where he can get hospital bed as he is tall and those beds were small for him and he was uncomfortable.  Prostate CA -remote, 2013 s/p radical prostatectomy  Essential HTN: Controlled.  Continue Norvasc.  Continue to hold Avapro.  HLD -Continue Lipitor  Dysphagia: Nurse indicated that patient was having trouble swallowing.  Patient tells me that he has recently started noticing some problems swallowing the solids since about 2 weeks.  SLP has been consulted who is recommending MBS.  Sinus tachycardia: Could very well be due to pain.  EKG yesterday shows atrial tachycardia.  Will recheck EKG today.  Denies any chest pain or palpitation.  DVT prophylaxis: enoxaparin (LOVENOX) injection 40 mg Start: 03/21/20 1600   Code Status: Full Code  Family Communication:  None present at bedside.  Plan of care discussed with patient in length and he verbalized understanding and agreed with it.  Status is: Inpatient  Remains inpatient appropriate because:Inpatient level of care appropriate due to severity of illness   Dispo: The patient is from: Home  Anticipated d/c is to: SNF              Anticipated d/c date is: 2 days              Patient currently is not medically stable to d/c.        Estimated body mass index is 26.37 kg/m as calculated from the following:   Height as of this encounter: 6\' 3"  (1.905 m).   Weight as of this encounter: 95.7 kg.      Nutritional status:               Consultants:   None  Procedures:   None  Antimicrobials:  Anti-infectives (From admission, onward)   None         Subjective: Seen and examined in the ED.  Very pleasant gentleman.  Complains of pain in bilateral lower extremities which is  chronic and takes medications for that.  Also indicated that he has been having some difficulty swallowing since about 2 weeks.  Objective: Vitals:   03/22/20 0800 03/22/20 1000 03/22/20 1100 03/22/20 1200  BP: 110/72 132/77 128/75 124/68  Pulse: (!) 113 (!) 101 (!) 112 (!) 111  Resp:      Temp:      TempSrc:      SpO2: 100% (!) 87% 100% 99%  Weight:      Height:        Intake/Output Summary (Last 24 hours) at 03/22/2020 1321 Last data filed at 03/22/2020 1051 Gross per 24 hour  Intake 1100 ml  Output --  Net 1100 ml   Filed Weights   03/21/20 1149  Weight: 95.7 kg    Examination:  General exam: Appears calm and comfortable  Respiratory system: Clear to auscultation. Respiratory effort normal. Cardiovascular system: S1 & S2 heard, RRR. No JVD, murmurs, rubs, gallops or clicks. No pedal edema.  Gastrointestinal system: Abdomen is nondistended, soft and nontender. No organomegaly or masses felt. Normal bowel sounds heard. Central nervous system: Alert and oriented. No focal neurological deficits. Extremities: Symmetric 5 x 5 power. Skin: No rashes, lesions or ulcers Psychiatry: Judgement and insight appear normal. Mood & affect appropriate.    Data Reviewed: I have personally reviewed following labs and imaging studies  CBC: Recent Labs  Lab 03/21/20 1722  WBC 13.5*  NEUTROABS 10.0*  HGB 12.5*  HCT 39.9  MCV 92.6  PLT 967   Basic Metabolic Panel: Recent Labs  Lab 03/21/20 1257 03/21/20 1700 03/21/20 2023 03/22/20 0127 03/22/20 0644  NA 140 140 140 141 141  K 5.6* 4.5 5.0 4.4 4.4  CL 96* 100 102 104 105  CO2 14* 15* 21* 25 26  GLUCOSE 290* 253* 172* 180* 148*  BUN 24* 25* 24* 31* 34*  CREATININE 2.17* 2.00* 1.70* 1.45* 1.20  CALCIUM 9.8 9.1 9.0 9.0 9.2   GFR: Estimated Creatinine Clearance: 66.5 mL/min (by C-G formula based on SCr of 1.2 mg/dL). Liver Function Tests: Recent Labs  Lab 03/21/20 1257  AST 33  ALT 16  ALKPHOS 80  BILITOT 3.2*   PROT 6.8  ALBUMIN 3.8   Recent Labs  Lab 03/21/20 1257  LIPASE 19   No results for input(s): AMMONIA in the last 168 hours. Coagulation Profile: No results for input(s): INR, PROTIME in the last 168 hours. Cardiac Enzymes: Recent Labs  Lab 03/21/20 1257  CKTOTAL 96   BNP (last 3 results) No results for input(s): PROBNP in the last 8760 hours. HbA1C: Recent Labs  03/21/20 1700  HGBA1C 6.2*   CBG: Recent Labs  Lab 03/22/20 0451 03/22/20 0555 03/22/20 0650 03/22/20 0802 03/22/20 1049  GLUCAP 172* 153* 157* 171* 120*   Lipid Profile: No results for input(s): CHOL, HDL, LDLCALC, TRIG, CHOLHDL, LDLDIRECT in the last 72 hours. Thyroid Function Tests: No results for input(s): TSH, T4TOTAL, FREET4, T3FREE, THYROIDAB in the last 72 hours. Anemia Panel: No results for input(s): VITAMINB12, FOLATE, FERRITIN, TIBC, IRON, RETICCTPCT in the last 72 hours. Sepsis Labs: No results for input(s): PROCALCITON, LATICACIDVEN in the last 168 hours.  Recent Results (from the past 240 hour(s))  SARS Coronavirus 2 by RT PCR (hospital order, performed in [facility] hospital lab) Nasopharyngeal Nasopharyngeal Swab     Status: None   Collection Time: 03/21/20  6:12 PM   Specimen: Nasopharyngeal Swab  Result Value Ref Range Status   SARS Coronavirus 2 NEGATIVE NEGATIVE Final    Comment: (NOTE) SARS-CoV-2 target nucleic acids are NOT DETECTED.  The SARS-CoV-2 RNA is generally detectable in upper and lower respiratory specimens during the acute phase of infection. The lowest concentration of SARS-CoV-2 viral copies this assay can detect is 250 copies / mL. A negative result does not preclude SARS-CoV-2 infection and should not be used as the sole basis for treatment or other patient management decisions.  A negative result may occur with improper specimen collection / handling, submission of specimen other than nasopharyngeal swab, presence of viral mutation(s) within the areas  targeted by this assay, and inadequate number of viral copies (<250 copies / mL). A negative result must be combined with clinical observations, patient history, and epidemiological information.  Fact Sheet for Patients:   StrictlyIdeas.no  Fact Sheet for Healthcare Providers: BankingDealers.co.za  This test is not yet approved or  cleared by the [place] FDA and has been authorized for detection and/or diagnosis of SARS-CoV-2 by FDA under an Emergency Use Authorization (EUA).  This EUA will remain in effect (meaning this test can be used) for the duration of the COVID-19 declaration under Section 564(b)(1) of the Act, 21 U.S.C. section 360bbb-3(b)(1), unless the authorization is terminated or revoked sooner.  Performed at [facility], [place] [address], [place], [place] [address]       Radiology Studies: DG Pelvis Portable  Result Date: 03/21/2020 CLINICAL DATA:  Pelvic pain after fall EXAM: PORTABLE PELVIS 1-2 VIEWS COMPARISON:  11/10/2014 FINDINGS: There is no evidence of pelvic fracture or diastasis. Moderate arthropathy of the bilateral hips. Degenerative changes within the visualized lower lumbar spine with syndesmophytes and interspinous ligament ossification suggesting ankylosing spondylitis. IMPRESSION: 1. No acute osseous abnormality. 2. 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 Electronically Signed   By: Davina Poke D.O.   On: 03/21/2020 12:28   DG Chest Port 1 View  Result Date: 03/21/2020 CLINICAL DATA:  Weakness, fall EXAM: PORTABLE CHEST 1 VIEW COMPARISON:  12/24/2019 FINDINGS: The heart size and mediastinal contours are within normal limits. Chronic elevation of the right hemidiaphragm with associated right basilar scarring. No new focal airspace consolidation. No pleural effusion or pneumothorax. Degenerative changes of the shoulders, left worse than right. The visualized skeletal  structures are unremarkable. IMPRESSION: 1. No active cardiopulmonary disease. 2. Chronic elevation of the right hemidiaphragm with associated right basilar scarring. Electronically Signed   By: Davina Poke D.O.   On: 03/21/2020 12:35    Scheduled Meds: . amLODipine  10 mg Oral Daily  . atorvastatin  10 mg Oral Daily  . cholecalciferol  1,000 Units  Oral Daily  . enoxaparin (LOVENOX) injection  40 mg Subcutaneous Q24H  . insulin aspart  0-15 Units Subcutaneous Q4H  . insulin glargine  10 Units Subcutaneous Daily   Continuous Infusions: . sodium chloride       LOS: 0 days   Time spent: 34 minutes   Darliss Cheney, MD [facility]  03/22/2020, 1:21 PM   To contact the attending provider between 7A-7P or the covering provider during after hours 7P-7A, please log into the web site [email or web address].

## 2020-03-22 NOTE — ED Notes (Signed)
Pt coughs and sputters when drinking water, MD notified.

## 2020-03-22 NOTE — Progress Notes (Signed)
Modified Barium Swallow Progress Note  Patient Details  Name: Jay Holloway MRN: [id number] Date of Birth: [birth year]  Today's Date: 03/22/2020  Modified Barium Swallow completed.  Full report located under Chart Review in the Imaging Section.  Brief recommendations include the following:  Clinical Impression  Patient presents with a mild-mod oral and a moderate pharyngeal dysphagia. During oral phase, patient exhibited delays in mastication and oral manipulation and anterior to posterior propulsion of regular solids, puree solids and minimally with thin liquids. Pharyngeal phase was characterized by swallow initiation delays to vallecular sinus, majority of regular solids and puree solids bolus remaining in vallecular sinus post initial swallow. Vallecular residual did not transit with second cued swallow but with thin liquids sip, approximately 70% of vallecular residuals transited out. One instance of penetration with full clearance of penetrate was observed with thin liquids sips and occured during the swallow. Patient exhibited decreased hyoid-laryngeal movement, decreased base of tongue strength as well as osteophytes which were pushing into pharynx and causing a structural narrowing of pharyngeal space.   Swallow Evaluation Recommendations       SLP Diet Recommendations: Dysphagia 1 (Puree) solids;Thin liquid   Liquid Administration via: Cup;Straw   Medication Administration: Crushed with puree   Supervision: Patient able to self feed;Intermittent supervision to cue for compensatory strategies   Compensations: Minimize environmental distractions;Small sips/bites;Slow rate;Follow solids with liquid;Multiple dry swallows after each bite/sip   Postural Changes: Remain semi-upright after after feeds/meals (Comment);Seated upright at 90 degrees   Oral Care Recommendations: Oral care BID       Sonia Baller, MA, [facility] Pager: [phone]

## 2020-03-22 NOTE — ED Notes (Signed)
No choking noted with applesauce followed by thickened water

## 2020-03-22 NOTE — Discharge Planning (Signed)
Licensed Clinical Social Worker is seeking post-discharge placement for this patient at the following level of care: SNF    

## 2020-03-22 NOTE — ED Notes (Signed)
Dinner tray delivered.

## 2020-03-22 NOTE — ED Notes (Signed)
Ordered Hosp Bed °

## 2020-03-22 NOTE — Evaluation (Signed)
Clinical/Bedside Swallow Evaluation Patient Details  Name: Jay Holloway MRN: [id number] Date of Birth: 18-Apr-1946  Today's Date: 03/22/2020 Time: SLP Start Time (ACUTE ONLY): 1100 SLP Stop Time (ACUTE ONLY): 1120 SLP Time Calculation (min) (ACUTE ONLY): 20 min  Past Medical History:  Past Medical History:  Diagnosis Date  . Arthritis    trouble turning head side to side and up  . Diabetes mellitus without complication ([place])   . Hemorrhoids   . High cholesterol   . Hypertension   . Prostate cancer [facility])    Past Surgical History:  Past Surgical History:  Procedure Laterality Date  . CHOLECYSTECTOMY N/A 11/11/2014   Procedure: LAPAROSCOPIC CHOLECYSTECTOMY WITH INTRAOPERATIVE CHOLANGIOGRAM;  Surgeon: Erroll Luna, MD;  Location: [place];  Service: General;  Laterality: N/A;  . COLONOSCOPY N/A 11/09/2014   Procedure: COLONOSCOPY;  Surgeon: Juanita Craver, MD;  Location: [facility] ENDOSCOPY;  Service: Endoscopy;  Laterality: N/A;  . CYSTOSCOPY WITH RETROGRADE PYELOGRAM, URETEROSCOPY AND STENT PLACEMENT Left 04/18/2014   Procedure: CYSTOSCOPY WITH RETROGRADE PYELOGRAM, URETEROSCOPY AND STENT PLACEMENT;  Surgeon: Raynelle Bring, MD;  [facility];  Service: Urology;  Laterality: Left;  . HOLMIUM LASER APPLICATION Left 08/25/7865   Procedure: HOLMIUM LASER APPLICATION;  Surgeon: Raynelle Bring, MD;  [facility];  Service: Urology;  Laterality: Left;  . LYMPHADENECTOMY  06/04/2012   Procedure: LYMPHADENECTOMY;  Surgeon: Dutch Gray, MD;  Location: [facility];  Service: Urology;  Laterality: Bilateral;  . right  achilles tendon repair  yrs ago  . ROBOT ASSISTED LAPAROSCOPIC RADICAL PROSTATECTOMY  06/04/2012   Procedure: ROBOTIC ASSISTED LAPAROSCOPIC RADICAL PROSTATECTOMY LEVEL 3;  Surgeon: Dutch Gray, MD;  [facility];  Service: Urology;  Laterality: N/A;   HPI:  Pt is a 74 y/o male admitted secondary to fall from bed. Also found to be dehydrated. PMH includes HTN, prostate cancer, DM.   Assessment  / Plan / Recommendation Clinical Impression  Patient presents with a mild oropharyngeal dysphagia with suspected impact from GERD secondary to overt symptoms (belching, globus sensation, throat clearing). Patient exhibited delayed coughing and throat clearing during PO intake of thin liquids, but this did seem more related to secretions than aspiration/penetration. In addition, he exhibited slow mastication with regular solids and multiple swallows with liquids and solids. MBS is indicated to r/o penetration/aspiraiton. SLP Visit Diagnosis: Dysphagia, unspecified (R13.10)    Aspiration Risk  Mild aspiration risk    Diet Recommendation Dysphagia 3 (Mech soft);Thin liquid   Liquid Administration via: Cup;Straw Medication Administration: Whole meds with puree Supervision: Patient able to self feed Compensations: Slow rate;Small sips/bites Postural Changes: Seated upright at 90 degrees;Remain upright for at least 30 minutes after po intake    Other  Recommendations Recommended Consults: Consider GI evaluation Oral Care Recommendations: Oral care BID   Follow up Recommendations Skilled Nursing facility      Frequency and Duration min 1 x/week  1 week       Prognosis Prognosis for Safe Diet Advancement: Good      Swallow Study   General Date of Onset: 03/21/20 HPI: Pt is a 74 y/o male admitted secondary to fall from bed. Also found to be dehydrated. PMH includes HTN, prostate cancer, DM. Type of Study: Bedside Swallow Evaluation Previous Swallow Assessment: N/A Diet Prior to this Study: NPO Temperature Spikes Noted: No Respiratory Status: Room air History of Recent Intubation: No Behavior/Cognition: Pleasant mood;Cooperative;Alert Oral Cavity Assessment: Within Functional Limits Oral Care Completed by SLP: Yes Oral Cavity - Dentition: Poor condition;Missing dentition Vision:  Functional for self-feeding Self-Feeding Abilities: Able to feed self Patient Positioning: Upright in  bed Baseline Vocal Quality: Normal Volitional Cough: Weak Volitional Swallow: Able to elicit    Oral/Motor/Sensory Function Overall Oral Motor/Sensory Function: Within functional limits   Ice Chips     Thin Liquid Thin Liquid: Impaired Presentation: Straw;Cup Pharyngeal  Phase Impairments: Other (comments);Cough - Delayed;Throat Clearing - Delayed Other Comments: multiple swallows; cough/throat clearing appeared secondary to overt s/s of reflux, but plan to complete MBS to r/o    Nectar Thick     Honey Thick     Puree Puree: Within functional limits   Solid     Solid: Impaired Oral Phase Impairments: Impaired mastication Oral Phase Functional Implications: Prolonged oral transit Pharyngeal Phase Impairments: Multiple swallows      Sonia Baller, MA, CCC-SLP Speech Therapy [place] [facility] Pager: [phone]

## 2020-03-23 LAB — BASIC METABOLIC PANEL
Anion gap: 9 (ref 5–15)
BUN: 15 mg/dL (ref 8–23)
CO2: 29 mmol/L (ref 22–32)
Calcium: 9.2 mg/dL (ref 8.9–10.3)
Chloride: 107 mmol/L (ref 98–111)
Creatinine, Ser: 0.92 mg/dL (ref 0.61–1.24)
GFR calc Af Amer: >60 mL/min (ref >60)
GFR calc non Af Amer: >60 mL/min (ref >60)
Glucose, Bld: 106 mg/dL — ABNORMAL HIGH (ref 70–99)
Potassium: 3.6 mmol/L (ref 3.5–5.1)
Sodium: 145 mmol/L (ref 135–145)

## 2020-03-23 LAB — CBC WITH DIFFERENTIAL/PLATELET
Abs Immature Granulocytes: 0.03 10*3/uL (ref 0.00–0.07)
Basophils Absolute: 0 10*3/uL (ref 0.0–0.1)
Basophils Relative: 0 %
Eosinophils Absolute: 0 10*3/uL (ref 0.0–0.5)
Eosinophils Relative: 0 %
HCT: 34.9 % — ABNORMAL LOW (ref 39.0–52.0)
Hemoglobin: 11 g/dL — ABNORMAL LOW (ref 13.0–17.0)
Immature Granulocytes: 0 %
Lymphocytes Relative: 29 %
Lymphs Abs: 2.9 10*3/uL (ref 0.7–4.0)
MCH: 28.6 pg (ref 26.0–34.0)
MCHC: 31.5 g/dL (ref 30.0–36.0)
MCV: 90.9 fL (ref 80.0–100.0)
Monocytes Absolute: 0.8 10*3/uL (ref 0.1–1.0)
Monocytes Relative: 8 %
Neutro Abs: 6.2 10*3/uL (ref 1.7–7.7)
Neutrophils Relative %: 63 %
Platelets: 207 10*3/uL (ref 150–400)
RBC: 3.84 MIL/uL — ABNORMAL LOW (ref 4.22–5.81)
RDW: 14.6 % (ref 11.5–15.5)
WBC: 10 10*3/uL (ref 4.0–10.5)
nRBC: 0 % (ref 0.0–0.2)

## 2020-03-23 LAB — GLUCOSE, CAPILLARY
Glucose-Capillary: 104 mg/dL — ABNORMAL HIGH (ref 70–99)
Glucose-Capillary: 110 mg/dL — ABNORMAL HIGH (ref 70–99)
Glucose-Capillary: 117 mg/dL — ABNORMAL HIGH (ref 70–99)
Glucose-Capillary: 127 mg/dL — ABNORMAL HIGH (ref 70–99)
Glucose-Capillary: 153 mg/dL — ABNORMAL HIGH (ref 70–99)
Glucose-Capillary: 195 mg/dL — ABNORMAL HIGH (ref 70–99)
Glucose-Capillary: 309 mg/dL — ABNORMAL HIGH (ref 70–99)

## 2020-03-23 MED ORDER — SODIUM CHLORIDE 0.9 % IV SOLN: INTRAVENOUS | Status: DC | PRN

## 2020-03-23 NOTE — Progress Notes (Signed)
Inpatient Diabetes Program Recommendations  AACE/ADA: New Consensus Statement on Inpatient Glycemic Control (2015)  Target Ranges:  Prepandial:   less than 140 mg/dL      Peak postprandial:   less than 180 mg/dL (1-2 hours)      Critically ill patients:  140 - 180 mg/dL   Lab Results  Component Value Date   GLUCAP 195 (H) 03/23/2020   HGBA1C 6.2 (H) 03/21/2020    Review of Glycemic Control Results for Jay Holloway, Jay Holloway (MRN [id number]) as of 03/23/2020 14:59  Ref. Range 03/22/2020 20:51 03/23/2020 00:17 03/23/2020 04:37 03/23/2020 08:12 03/23/2020 11:57  Glucose-Capillary Latest Ref Range: 70 - 99 mg/dL 160 (H) 153 (H) 127 (H) 117 (H) 195 (H)   Diabetes history: DM 2 Outpatient Diabetes medications:  Lantus 9 units (patient was not taking), Metformin 500 mg bid, Ozempic 1 mg weekly Current orders for Inpatient glycemic control:  Lantus 10 units daily, Novolog moderate q 4 hours Inpatient Diabetes Program Recommendations:    Per admitting MD note, patient was taken off insulin 2 days ago by PCP.  He was admitted after a fall with blood glucose=290 mg/dL and CO2=14 and AG=30.  It appears that he will need basal insulin resumed prior to d/c, possibly at lower dose. Note plans for d/c to SNF.  Will follow.   Thanks  Adah Perl, RN, BC-ADM Inpatient Diabetes Coordinator Pager [phone]

## 2020-03-23 NOTE — Progress Notes (Signed)
PROGRESS NOTE    Jay Holloway  WPY:[id number] DOB: [birth year] DOA: 03/21/2020 PCP: [name], MD   Brief Narrative:   Jay Holloway is a 74 y.o. male with medical history significant of prostate CA; HTN; HLD; and DM presented after he slipped and  fell out of bed overnight and was unable to get up.  He recently was discharged from rehab and was doing pretty well but then noticed that he couldn't walk again.  +polyuria.  He did not injure himself when he fell.  He called his nephew and he came to help, but he remained in the floor all night long.  He has not been taking good PO intake, none in 2 days because he didn't feel like it.  He was taken off his insulin by his PCP 2 days ago.  Reportedly he has been having some dysphagia for last 2 weeks.  He was admitted due to generalized weakness and was also diagnosed with DKA and was started on insulin and DKA protocol IV fluids.  Also had AKI/dehydration which resolved after IV fluids.     Assessment & Plan:   Active Problems:   DKA (diabetic ketoacidoses) ([place])   Type 2 diabetes mellitus with ketoacidosis without coma (H[facility])   Weakness   Prostate cancer ([facility])   Hypertension   High cholesterol   AKI (acute kidney injury) ([place])   DKA in type 2 diabetes mellitus:  -Patient with good baseline control but who was reportedly taken off insulin by his PCP 2 days ago presented with DKA.  Started on insulin and IV fluids which was then switched to dextrose.  On the morning of 03/22/2020 DKA has resolved.  Anion gap is closed.  Hemoglobin A1c only 6.2.  Started on Lantus 10 units.  Blood sugar now remains controlled.  Continue current Lantus and SSI.  AKI: Likely secondary to dehydration due to DKA.  Resolved.  Generalized weakness: -Patient previously required SNF placement for rehab and lives alone.  He seems to be too weak.  He was initially opposed to considering the option for SNF but after more discussion, he told me that he did not like  the beds at the previous nursing home that he was at but he will consider going to a different SNF where he can get hospital bed as he is tall and those beds were small for him and he was uncomfortable.  PT consultation was placed on 03/21/2020 but there is no note.  I just reached out to the nurse to reach out to PT to see when they are going to see him.  Prostate CA -remote, 2013 s/p radical prostatectomy  Essential HTN: Controlled.  Continue Norvasc.  Continue to hold Avapro.  HLD -Continue Lipitor  Dysphagia/GERD: Nurse indicated that patient was having trouble swallowing.  Patient told me that he has recently started noticing some problems swallowing the solids since about 2 weeks.  He was seen by SLP and underwent MBS.  Found to have no significant dysphagia but possibly some GERD.  Started on Protonix.  Sinus tachycardia: Could very well be due to pain.  EKG yesterday shows atrial tachycardia.  Denies any chest pain or palpitation.  Improving now.  DVT prophylaxis: enoxaparin (LOVENOX) injection 40 mg Start: 03/21/20 1600   Code Status: Full Code  Family Communication:  None present at bedside.  Plan of care discussed with patient in length and he verbalized understanding and agreed with it.  Status is: Inpatient  Remains inpatient appropriate because:Inpatient level  of care appropriate due to severity of illness   Dispo: The patient is from: Home              Anticipated d/c is to: SNF              Anticipated d/c date is: 2 days              Patient currently is not medically stable to d/c.        Estimated body mass index is 24.52 kg/m as calculated from the following:   Height as of this encounter: 6\' 5"  (1.956 m).   Weight as of this encounter: 93.8 kg.      Nutritional status:               Consultants:   None  Procedures:   None  Antimicrobials:  Anti-infectives (From admission, onward)   None         Subjective: Seen and  examined.  He tells me that he feels better than yesterday.  Lower extremity pain which is chronic is improved and controlled with his current pain medications.  He has no new complaint.  Objective: Vitals:   03/22/20 2225 03/23/20 0013 03/23/20 0435 03/23/20 0902  BP:  106/65 109/64 111/66  Pulse:  (!) 104 (!) 102 (!) 102  Resp:  20 20 18   Temp:  97.7 F (36.5 C) (!) 97.5 F (36.4 C) 98.6 F (37 C)  TempSrc:  Oral Oral Oral  SpO2:  97% 98% 97%  Weight: 92.2 kg 93.8 kg    Height: 6\' 5"  (1.956 m)       Intake/Output Summary (Last 24 hours) at 03/23/2020 1324 Last data filed at 03/23/2020 1013 Gross per 24 hour  Intake 1193.15 ml  Output 925 ml  Net 268.15 ml   Filed Weights   03/21/20 1149 03/22/20 2225 03/23/20 0013  Weight: 95.7 kg 92.2 kg 93.8 kg    Examination:  General exam: Appears calm and comfortable  Respiratory system: Clear to auscultation. Respiratory effort normal. Cardiovascular system: S1 & S2 heard, RRR. No JVD, murmurs, rubs, gallops or clicks. No pedal edema. Gastrointestinal system: Abdomen is nondistended, soft and nontender. No organomegaly or masses felt. Normal bowel sounds heard. Central nervous system: Alert and oriented. No focal neurological deficits. Extremities: Symmetric 5 x 5 power. Skin: No rashes, lesions or ulcers.  Psychiatry: Judgement and insight appear normal. Mood & affect appropriate.     Data Reviewed: I have personally reviewed following labs and imaging studies  CBC: Recent Labs  Lab 03/21/20 1722 03/23/20 0716  WBC 13.5* 10.0  NEUTROABS 10.0* 6.2  HGB 12.5* 11.0*  HCT 39.9 34.9*  MCV 92.6 90.9  PLT 234 315   Basic Metabolic Panel: Recent Labs  Lab 03/21/20 1700 03/21/20 2023 03/22/20 0127 03/22/20 0644 03/23/20 0716  NA 140 140 141 141 145  K 4.5 5.0 4.4 4.4 3.6  CL 100 102 104 105 107  CO2 15* 21* 25 26 29   GLUCOSE 253* 172* 180* 148* 106*  BUN 25* 24* 31* 34* 15  CREATININE 2.00* 1.70* 1.45* 1.20 0.92    CALCIUM 9.1 9.0 9.0 9.2 9.2   GFR: Estimated Creatinine Clearance: 91.5 mL/min (by C-G formula based on SCr of 0.92 mg/dL). Liver Function Tests: Recent Labs  Lab 03/21/20 1257  AST 33  ALT 16  ALKPHOS 80  BILITOT 3.2*  PROT 6.8  ALBUMIN 3.8   Recent Labs  Lab 03/21/20 1257  LIPASE 19  No results for input(s): AMMONIA in the last 168 hours. Coagulation Profile: No results for input(s): INR, PROTIME in the last 168 hours. Cardiac Enzymes: Recent Labs  Lab 03/21/20 1257  CKTOTAL 96   BNP (last 3 results) No results for input(s): PROBNP in the last 8760 hours. HbA1C: Recent Labs    03/21/20 1700  HGBA1C 6.2*   CBG: Recent Labs  Lab 03/22/20 2051 03/23/20 0017 03/23/20 0437 03/23/20 0812 03/23/20 1157  GLUCAP 160* 153* 127* 117* 195*   Lipid Profile: No results for input(s): CHOL, HDL, LDLCALC, TRIG, CHOLHDL, LDLDIRECT in the last 72 hours. Thyroid Function Tests: No results for input(s): TSH, T4TOTAL, FREET4, T3FREE, THYROIDAB in the last 72 hours. Anemia Panel: No results for input(s): VITAMINB12, FOLATE, FERRITIN, TIBC, IRON, RETICCTPCT in the last 72 hours. Sepsis Labs: No results for input(s): PROCALCITON, LATICACIDVEN in the last 168 hours.  Recent Results (from the past 240 hour(s))  SARS Coronavirus 2 by RT PCR (hospital order, performed in [facility] hospital lab) Nasopharyngeal Nasopharyngeal Swab     Status: None   Collection Time: 03/21/20  6:12 PM   Specimen: Nasopharyngeal Swab  Result Value Ref Range Status   SARS Coronavirus 2 NEGATIVE NEGATIVE Final    Comment: (NOTE) SARS-CoV-2 target nucleic acids are NOT DETECTED.  The SARS-CoV-2 RNA is generally detectable in upper and lower respiratory specimens during the acute phase of infection. The lowest concentration of SARS-CoV-2 viral copies this assay can detect is 250 copies / mL. A negative result does not preclude SARS-CoV-2 infection and should not be used as the sole basis for  treatment or other patient management decisions.  A negative result may occur with improper specimen collection / handling, submission of specimen other than nasopharyngeal swab, presence of viral mutation(s) within the areas targeted by this assay, and inadequate number of viral copies (<250 copies / mL). A negative result must be combined with clinical observations, patient history, and epidemiological information.  Fact Sheet for Patients:   StrictlyIdeas.no  Fact Sheet for Healthcare Providers: BankingDealers.co.za  This test is not yet approved or  cleared by the [place] FDA and has been authorized for detection and/or diagnosis of SARS-CoV-2 by FDA under an Emergency Use Authorization (EUA).  This EUA will remain in effect (meaning this test can be used) for the duration of the COVID-19 declaration under Section 564(b)(1) of the Act, 21 U.S.C. section 360bbb-3(b)(1), unless the authorization is terminated or revoked sooner.  Performed at [facility], [place] [address] [place], [place] [address]       Radiology Studies: DG Swallowing Func-Speech Pathology  Result Date: 03/22/2020 Objective Swallowing Evaluation: Type of Study: MBS-Modified Barium Swallow Study  Patient Details Name: Jay Holloway MRN: [id number] Date of Birth: Jun 06, 1946 Today's Date: 03/22/2020 Time: SLP Start Time (ACUTE ONLY): 8921 -SLP Stop Time (ACUTE ONLY): 1425 SLP Time Calculation (min) (ACUTE ONLY): 20 min Past Medical History: Past Medical History: Diagnosis Date . Arthritis   trouble turning head side to side and up . Diabetes mellitus without complication ([place])  . Hemorrhoids  . High cholesterol  . Hypertension  . Prostate cancer [facility])  Past Surgical History: Past Surgical History: Procedure Laterality Date . CHOLECYSTECTOMY N/A 11/11/2014  Procedure: LAPAROSCOPIC CHOLECYSTECTOMY WITH INTRAOPERATIVE CHOLANGIOGRAM;  Surgeon: Erroll Luna, MD;   Location: [place];  Service: General;  Laterality: N/A; . COLONOSCOPY N/A 11/09/2014  Procedure: COLONOSCOPY;  Surgeon: Juanita Craver, MD;  Location: [facility] ENDOSCOPY;  Service: Endoscopy;  Laterality: N/A; . CYSTOSCOPY  WITH RETROGRADE PYELOGRAM, URETEROSCOPY AND STENT PLACEMENT Left 04/18/2014  Procedure: CYSTOSCOPY WITH RETROGRADE PYELOGRAM, URETEROSCOPY AND STENT PLACEMENT;  Surgeon: Raynelle Bring, MD;  [facility];  Service: Urology;  Laterality: Left; . HOLMIUM LASER APPLICATION Left 2/84/1324  Procedure: HOLMIUM LASER APPLICATION;  Surgeon: Raynelle Bring, MD;  [facility];  Service: Urology;  Laterality: Left; . LYMPHADENECTOMY  06/04/2012  Procedure: LYMPHADENECTOMY;  Surgeon: Dutch Gray, MD;  [facility];  Service: Urology;  Laterality: Bilateral; . right  achilles tendon repair  yrs ago . ROBOT ASSISTED LAPAROSCOPIC RADICAL PROSTATECTOMY  06/04/2012  Procedure: ROBOTIC ASSISTED LAPAROSCOPIC RADICAL PROSTATECTOMY LEVEL 3;  Surgeon: Dutch Gray, MD;  [facility];  Service: Urology;  Laterality: N/A; HPI: Pt is a 74 y/o male admitted secondary to fall from bed. Also found to be dehydrated. PMH includes HTN, prostate cancer, DM.  Subjective: pleasant, oriented Assessment / Plan / Recommendation CHL IP CLINICAL IMPRESSIONS 03/22/2020 Clinical Impression Patient presents with a mild-mod oral and a moderate pharyngeal dysphagia. During oral phase, patient exhibited delays in mastication and oral manipulation and anterior to posterior propulsion of regular solids, puree solids and minimally with thin liquids. Pharyngeal phase was characterized by swallow initiation delays to vallecular sinus, majority of regular solids and puree solids bolus remaining in vallecular sinus post initial swallow. Vallecular residual did not transit with second cued swallow but with thin liquids sip, approximately 70% of vallecular residuals transited out. One instance of penetration with full clearance of penetrate was observed  with thin liquids sips and occured during the swallow. Patient exhibited decreased hyoid-laryngeal movement, decreased base of tongue strength as well as osteophytes which were pushing into pharynx and causing a structural narrowing of pharyngeal space. SLP Visit Diagnosis Dysphagia, oropharyngeal phase (R13.12) Attention and concentration deficit following -- Frontal lobe and executive function deficit following -- Impact on safety and function Mild aspiration risk   CHL IP TREATMENT RECOMMENDATION 03/22/2020 Treatment Recommendations Therapy as outlined in treatment plan below   Prognosis 03/22/2020 Prognosis for Safe Diet Advancement Good Barriers to Reach Goals -- Barriers/Prognosis Comment -- CHL IP DIET RECOMMENDATION 03/22/2020 SLP Diet Recommendations Dysphagia 1 (Puree) solids;Thin liquid Liquid Administration via Cup;Straw Medication Administration Crushed with puree Compensations Minimize environmental distractions;Small sips/bites;Slow rate;Follow solids with liquid;Multiple dry swallows after each bite/sip Postural Changes Remain semi-upright after after feeds/meals (Comment);Seated upright at 90 degrees   CHL IP OTHER RECOMMENDATIONS 03/22/2020 Recommended Consults -- Oral Care Recommendations Oral care BID Other Recommendations --   CHL IP FOLLOW UP RECOMMENDATIONS 03/22/2020 Follow up Recommendations Skilled Nursing facility   [facility] IP FREQUENCY AND DURATION 03/22/2020 Speech Therapy Frequency (ACUTE ONLY) min 2x/week Treatment Duration 1 week      CHL IP ORAL PHASE 03/22/2020 Oral Phase Impaired Oral - Pudding Teaspoon -- Oral - Pudding Cup -- Oral - Honey Teaspoon -- Oral - Honey Cup -- Oral - Nectar Teaspoon -- Oral - Nectar Cup -- Oral - Nectar Straw -- Oral - Thin Teaspoon -- Oral - Thin Cup Reduced posterior propulsion;Delayed oral transit;Weak lingual manipulation;Premature spillage Oral - Thin Straw Weak lingual manipulation;Delayed oral transit;Reduced posterior propulsion;Premature spillage Oral -  Puree Weak lingual manipulation;Delayed oral transit;Reduced posterior propulsion Oral - Mech Soft -- Oral - Regular Reduced posterior propulsion;Weak lingual manipulation;Impaired mastication;Delayed oral transit Oral - Multi-Consistency -- Oral - Pill Delayed oral transit;Reduced posterior propulsion;Weak lingual manipulation Oral Phase - Comment --  CHL IP PHARYNGEAL PHASE 03/22/2020 Pharyngeal Phase Impaired Pharyngeal- Pudding Teaspoon -- Pharyngeal -- Pharyngeal- Pudding Cup --  Pharyngeal -- Pharyngeal- Honey Teaspoon -- Pharyngeal -- Pharyngeal- Honey Cup -- Pharyngeal -- Pharyngeal- Nectar Teaspoon -- Pharyngeal -- Pharyngeal- Nectar Cup -- Pharyngeal -- Pharyngeal- Nectar Straw -- Pharyngeal -- Pharyngeal- Thin Teaspoon -- Pharyngeal -- Pharyngeal- Thin Cup Delayed swallow initiation-vallecula;Penetration/Aspiration during swallow;Pharyngeal residue - valleculae Pharyngeal Material enters airway, remains ABOVE vocal cords then ejected out Pharyngeal- Thin Straw Delayed swallow initiation-vallecula;Pharyngeal residue - valleculae Pharyngeal -- Pharyngeal- Puree Delayed swallow initiation-vallecula;Reduced laryngeal elevation;Reduced tongue base retraction;Pharyngeal residue - valleculae Pharyngeal -- Pharyngeal- Mechanical Soft -- Pharyngeal -- Pharyngeal- Regular Delayed swallow initiation-vallecula;Pharyngeal residue - valleculae Pharyngeal -- Pharyngeal- Multi-consistency -- Pharyngeal -- Pharyngeal- Pill Delayed swallow initiation-vallecula;Pharyngeal residue - valleculae Pharyngeal -- Pharyngeal Comment --  CHL IP CERVICAL ESOPHAGEAL PHASE 03/22/2020 Cervical Esophageal Phase WFL Pudding Teaspoon -- Pudding Cup -- Honey Teaspoon -- Honey Cup -- Nectar Teaspoon -- Nectar Cup -- Nectar Straw -- Thin Teaspoon -- Thin Cup -- Thin Straw -- Puree -- Mechanical Soft -- Regular -- Multi-consistency -- Pill -- Cervical Esophageal Comment -- Sonia Baller, MA, CCC-SLP Speech Therapy [facility] Pager: [phone]               Scheduled Meds: . amLODipine  10 mg Oral Daily  . atorvastatin  10 mg Oral Daily  . cholecalciferol  1,000 Units Oral Daily  . enoxaparin (LOVENOX) injection  40 mg Subcutaneous Q24H  . insulin aspart  0-15 Units Subcutaneous Q4H  . insulin glargine  10 Units Subcutaneous Daily  . pantoprazole  40 mg Oral BID   Continuous Infusions: . sodium chloride 100 mL/hr at 03/23/20 1113  . sodium chloride       LOS: 1 day   Time spent: 28 minutes   Darliss Cheney, MD [facility]  03/23/2020, 1:24 PM   To contact the attending provider between 7A-7P or the covering provider during after hours 7P-7A, please log into the web site [email or web address].

## 2020-03-23 NOTE — Progress Notes (Signed)
  Mobility Specialist Criteria Algorithm Info.  Mobility Team: HOB elevated: Activity: Ambulated in hall;Transferred:  Bed to chair;Ambulated to bathroom (Tx to chair first but then ambulated in hall after) Range of motion: Active;All extremities Level of assistance: Minimal assist, patient does 75% or more (Min A sit/stand from low surfaces (BSC, toilet, recliner)) Assistive device: Front wheel walker Minutes sitting in chair:  Minutes stood: 5 minutes Minutes ambulated: 5 minutes Distance ambulated (ft): 45 ft Mobility response: Tolerated well;RN notified (Cues for hand placement and proper RW mechanics ) Bed Position: Chair (Croom chair)  Pt more than willing to participate in mobility this morning. Required minimal HHA to the EOB and no physical assistance to stand from elevated surface. From lower surfaces (BSC, toilet) he required minimal HHA.Tolerated hallway ambulation well w/o any complaints beyond having an upset stomach. Per pt, nausea come and goes randomly. Pt now sitting in recliner chair with all needs met and call bell within reach.     03/23/2020 12:16 PM

## 2020-03-23 NOTE — Progress Notes (Signed)
  Speech Language Pathology Treatment: Dysphagia  Patient Details Name: Jay Holloway MRN: [id number] DOB: Oct 08, 1945 Today's Date: 03/23/2020 Time: 9233-0076 SLP Time Calculation (min) (ACUTE ONLY): 16 min  Assessment / Plan / Recommendation Clinical Impression  Pt was seen for dysphagia treatment. He was alert and cooperative. He indicated that he has not been on dysphagia 1 long enough to notice any improvement in his chronic dysphagia symptoms. He tolerated puree solids and thin liquids without overt s/sx of aspiration. Secondary swallows were noted and pt reported globus sensation which was improved with a liquid wash. Eructation continues to be intermittently demonstrated. He completed effortful swallows and the [place] with intermittent cues. SLP will continue to follow pt.    HPI HPI: Pt is a 74 y/o male admitted secondary to fall from bed. Also found to be dehydrated. PMH includes HTN, prostate cancer, DM. MBS 8/25: pharyngeal dysphagia. During oral phase, patient exhibited delays in mastication and oral manipulation and anterior to posterior propulsion of regular solids, puree solids and minimally with thin liquids. Pharyngeal phase was characterized by swallow initiation delays to vallecular sinus, majority of regular solids and puree solids bolus remaining in vallecular sinus post initial swallow.      SLP Plan  Continue with current plan of care       Recommendations  Diet recommendations: Dysphagia 1 (puree);Thin liquid Liquids provided via: Cup;Straw Medication Administration: Whole meds with puree Supervision: Patient able to self feed Compensations: Minimize environmental distractions;Small sips/bites;Slow rate;Follow solids with liquid;Multiple dry swallows after each bite/sip Postural Changes and/or Swallow Maneuvers: Seated upright 90 degrees                Oral Care Recommendations: Oral care BID Follow up Recommendations: Skilled Nursing facility SLP Visit  Diagnosis: Dysphagia, oropharyngeal phase (R13.12) Plan: Continue with current plan of care       Jay Holloway Jay Holloway, Jay Holloway, Jay Holloway Office number [phone] Pager [place] 03/23/2020, 10:45 AM

## 2020-03-23 NOTE — Progress Notes (Signed)
Rehab Admissions Coordinator Note:  Patient was screened by Cleatrice Burke for appropriateness for an Inpatient Acute Rehab Consult per PT recs. It is unlikely that [facility] will approve Cir admit for this high functional, Min Guard assist,  and for this diagnosis. Recommend SNF rehab at this time.  Cleatrice Burke RN MSN 03/23/2020, 4:12 PM  I can be reached at [phone].

## 2020-03-23 NOTE — Progress Notes (Signed)
Physical Therapy Treatment Patient Details Name: Jay Holloway MRN: [id number] DOB: [birth year] Today's Date: 03/23/2020    History of Present Illness Pt is a 74 y/o male admitted secondary to fall from bed. Also found to be dehydrated. PMH includes HTN, prostate cancer, DM.     PT Comments    Pt tolerates treatment well with improved mobility and gait quality this session. Pt continues to require minG for all activities due to generalized weakness which places him at an increased falls risk. Pt's ambulation tolerance is limited to household distances at this time. Pt reports he is not near his baseline, was independent and driving prior to recent SNF stay. Pt reports he has very little assistance available at home from his nephew and girlfriend at this time. Pt will benefit from high intensity inpatient PT services to aide in a return to a modI level of mobility and to improve tolerance for daily activities. PT recommends CIR consult at this time as the pt demonstrates the potential to return to a modI level with high intensity therapies.   Follow Up Recommendations  CIR     Equipment Recommendations  3in1 (PT)    Recommendations for Other Services Rehab consult     Precautions / Restrictions Precautions Precautions: Fall Restrictions Weight Bearing Restrictions: No    Mobility  Bed Mobility Overal bed mobility: Needs Assistance Bed Mobility: Sit to Supine       Sit to supine: Min guard      Transfers Overall transfer level: Needs assistance Equipment used: Rolling walker (2 wheeled) Transfers: Sit to/from Stand Sit to Stand: Min guard            Ambulation/Gait Ambulation/Gait assistance: Min guard Gait Distance (Feet): 70 Feet Assistive device: Rolling walker (2 wheeled) Gait Pattern/deviations: Step-to pattern;Wide base of support;Trunk flexed Gait velocity: reduced Gait velocity interpretation: <1.8 ft/sec, indicate of risk for recurrent falls General Gait  Details: pt with shortened step to gait, increased trunk flexion, limited gait tolerance   Stairs             Wheelchair Mobility    Modified Rankin (Stroke Patients Only)       Balance Overall balance assessment: Needs assistance Sitting-balance support: No upper extremity supported;Feet supported Sitting balance-Leahy Scale: Good     Standing balance support: Bilateral upper extremity supported Standing balance-Leahy Scale: Poor Standing balance comment: reliant on UE support, minG-close supervision                            Cognition Arousal/Alertness: Awake/alert Behavior During Therapy: WFL for tasks assessed/performed Overall Cognitive Status: Impaired/Different from baseline Area of Impairment: Following commands;Problem solving                       Following Commands: Follows one step commands consistently     Problem Solving: Slow processing        Exercises      General Comments General comments (skin integrity, edema, etc.): VSS on RA      Pertinent Vitals/Pain Pain Assessment: Faces Faces Pain Scale: Hurts even more Pain Location: legs Pain Descriptors / Indicators: Aching Pain Intervention(s): Monitored during session    Home Living                      Prior Function            PT Goals (current goals can now be found in  the care plan section) Acute Rehab PT Goals Patient Stated Goal: to improve mobility and restore independence Progress towards PT goals: Progressing toward goals    Frequency    Min 3X/week      PT Plan Current plan remains appropriate    Co-evaluation              AM-PAC PT "6 Clicks" Mobility   Outcome Measure  Help needed turning from your back to your side while in a flat bed without using bedrails?: A Little Help needed moving from lying on your back to sitting on the side of a flat bed without using bedrails?: A Little Help needed moving to and from a bed to a  chair (including a wheelchair)?: A Little Help needed standing up from a chair using your arms (e.g., wheelchair or bedside chair)?: A Little Help needed to walk in hospital room?: A Little Help needed climbing 3-5 steps with a railing? : A Lot 6 Click Score: 17    End of Session   Activity Tolerance: Patient tolerated treatment well Patient left: in bed;with call bell/phone within reach Nurse Communication: Mobility status PT Visit Diagnosis: Other abnormalities of gait and mobility (R26.89);Difficulty in walking, not elsewhere classified (R26.2);History of falling (Z91.81);Repeated falls (R29.6)     Time: 1410-1430 PT Time Calculation (min) (ACUTE ONLY): 20 min  Charges:  $Gait Training: 8-22 mins                     Zenaida Niece, PT, DPT Acute Rehabilitation Pager: [phone]    Zenaida Niece 03/23/2020, 2:44 PM

## 2020-03-23 NOTE — Plan of Care (Signed)
  Problem: Activity: °Goal: Risk for activity intolerance will decrease °Outcome: Progressing °  °Problem: Education: °Goal: Knowledge of General Education information will improve °Description: Including pain rating scale, medication(s)/side effects and non-pharmacologic comfort measures °Outcome: Progressing °  °Problem: Health Behavior/Discharge Planning: °Goal: Ability to manage health-related needs will improve °Outcome: Progressing °  °

## 2020-03-24 DIAGNOSIS — E0810 Diabetes mellitus due to underlying condition with ketoacidosis without coma: Secondary | ICD-10-CM

## 2020-03-24 LAB — GLUCOSE, CAPILLARY
Glucose-Capillary: 116 mg/dL — ABNORMAL HIGH (ref 70–99)
Glucose-Capillary: 136 mg/dL — ABNORMAL HIGH (ref 70–99)
Glucose-Capillary: 226 mg/dL — ABNORMAL HIGH (ref 70–99)
Glucose-Capillary: 179 mg/dL — ABNORMAL HIGH (ref 70–99)
Glucose-Capillary: 173 mg/dL — ABNORMAL HIGH (ref 70–99)
Glucose-Capillary: 182 mg/dL — ABNORMAL HIGH (ref 70–99)

## 2020-03-24 MED ORDER — POTASSIUM CHLORIDE ER 20 MEQ PO TBCR: 20.0000 meq | EXTENDED_RELEASE_TABLET | Freq: Every day | ORAL | 0 refills | Status: DC

## 2020-03-24 MED ORDER — KETOROLAC TROMETHAMINE 15 MG/ML IJ SOLN
15.0000 mg | Freq: Once | INTRAMUSCULAR | Status: AC | PRN
  Administered 2020-03-24: 15 mg via INTRAVENOUS
  Filled 2020-03-24: qty 1

## 2020-03-24 MED ORDER — INSULIN GLARGINE 100 UNIT/ML ~~LOC~~ SOLN: 10.0000 [IU] | Freq: Every day | SUBCUTANEOUS | 11 refills | Status: DC

## 2020-03-24 MED ORDER — MELATONIN 3 MG PO TABS
3.0000 mg | ORAL_TABLET | Freq: Every day | ORAL | Status: DC
  Administered 2020-03-24: 3 mg via ORAL
  Filled 2020-03-24 (×2): qty 1

## 2020-03-24 MED ORDER — HYDROCODONE-ACETAMINOPHEN 7.5-325 MG PO TABS
1.0000 | ORAL_TABLET | Freq: Three times a day (TID) | ORAL | 0 refills | Status: DC | PRN
Start: 2020-03-24 — End: 2020-04-19

## 2020-03-24 MED ORDER — FUROSEMIDE 40 MG PO TABS: 20.0000 mg | ORAL_TABLET | Freq: Every day | ORAL | 0 refills | Status: AC

## 2020-03-24 MED ORDER — MELATONIN 3 MG PO TABS
3.0000 mg | ORAL_TABLET | Freq: Every day | ORAL | 0 refills | Status: DC
Start: 2020-03-24 — End: 2020-05-19

## 2020-03-24 MED ORDER — PANTOPRAZOLE SODIUM 40 MG PO TBEC: 40.0000 mg | DELAYED_RELEASE_TABLET | Freq: Two times a day (BID) | ORAL | 0 refills | Status: AC

## 2020-03-24 NOTE — Discharge Summary (Signed)
Physician Discharge Summary  Jay Holloway KKX:[id number] DOB: June 13, 1946 DOA: 03/21/2020  PCP: [name], MD  Admit date: 03/21/2020 Discharge date: 03/24/2020  Recommendations for Outpatient Follow-up:  1. Discharge to SNF for PT/OT 2. Follow up with PCP in 7-10 days after discharge from SNF The patient should have BMP checked at this visit. 3. While at SNF, glucoses should be checked twice daily. Any glucose greater than 300 or less than 70 should be reported to facility physician. 4. After discharge the patient is to check his glucoses twice daily and write them down. He should take these in to his visit with his PCP. 5. BMP should be checked in one week at the SNF and reported to facility physician.  Discharge Diagnoses: Principal diagnosis is #1 1. DKA 2. Generalized Weakness 3. AKI due to volume depletion 4. Dehydration/Volume depletion due to DKA 5. Hypertension 6. Hyperlipidemia 7. Dysphagia  Discharge Condition: Fair  Disposition: SNF  Diet recommendation: Heart healthy/Carbohydrate modified.  Filed Weights   03/22/20 2225 03/23/20 0013 03/24/20 0348  Weight: 92.2 kg 93.8 kg 94.8 kg   History of present illness:  Jay Holloway is a 74 y.o. male with medical history significant of prostate CA; HTN; HLD; and DM presenting after he fell out of bed overnight and was unable to get up.  He slipped off his bed and was unable to get up.  He has been feeling weak.  He recently was discharged from rehab (no documentation about this is available at this time) and was doing pretty well but then noticed that he couldn't walk again.  +polyuria.  He did not injure himself when he fell.  He called his nephew and he came to help, but he remained in the floor all night long.  He has not been taking good PO intake, none in 2 days because he didn't feel like it.  He was taken off his insulin by his PCP 2 days ago.  Hospital Course: Jay Holloway a 74 y.o.malewith medical history  significant ofprostate CA; HTN; HLD; and DM presented after he slipped and  fell out of bed overnight and was unable to get up.He recently was discharged from rehab and was doing pretty well but then noticed that he couldn't walk again. +polyuria. He did not injure himself when he fell. He called his nephew and he came to help, but he remained in the floor all night long. He has not been taking good PO intake, none in 2 days because he didn't feel like it. He was taken off his insulin by his PCP 2 days ago.  Reportedly he has been having some dysphagia for last 2 weeks.  He was admitted due to generalized weakness and was also diagnosed with DKA and was started on insulin and DKA protocol IV fluids.  Also had AKI/dehydration which resolved after IV fluids.  The patient has been evaluated by PT/OT. They have recommended that he discharge to SNF for further rehabilitation.  Today's assessment: S: The patient is sitting up at bedside. No new complaints. He did not sleep well last night.  O: Vitals:  Vitals:   03/24/20 0817 03/24/20 1129  BP: 106/63 106/66  Pulse: 95 100  Resp:  18  Temp: 98.4 F (36.9 C) 98.4 F (36.9 C)  SpO2: 95% 99%   Exam:  Constitutional:  . The patient is awake, alert, and oriented x 3. No acute distress. Respiratory:  . No increased work of breathing. . No wheezes, rales, or rhonchi .  No tactile fremitus Cardiovascular:  . Regular rate and rhythm . No murmurs, ectopy, or gallups. . No lateral PMI. No thrills. Abdomen:  . Abdomen is soft, non-tender, non-distended . No hernias, masses, or organomegaly . Normoactive bowel sounds.  Musculoskeletal:  . No cyanosis, clubbing, or edema Skin:  . No rashes, lesions, ulcers . palpation of skin: no induration or nodules Neurologic:  . CN 2-12 intact . Sensation all 4 extremities intact Psychiatric:  . Mental status o Mood, affect appropriate o Orientation to person, place, time  . judgment and insight  appear intact  Discharge Instructions  Discharge Instructions    Activity as tolerated - No restrictions   Complete by: As directed    Call MD for:  difficulty breathing, headache or visual disturbances   Complete by: As directed    Call MD for:  persistant dizziness or light-headedness   Complete by: As directed    Call MD for:  persistant nausea and vomiting   Complete by: As directed    Call MD for:  severe uncontrolled pain   Complete by: As directed    Diet - low sodium heart healthy   Complete by: As directed    Diet Carb Modified   Complete by: As directed    Discharge instructions   Complete by: As directed    Discharge to SNF with PT/OT Pt to have chemistry checked in one week at SNF and reported to facility physician. Check glucose twice daily at SNF. Once in the am before breakfast and once in the evening before dinner.  Decrease lantus dose to 5 for glucose less than 90. Continue to check glucose daily when home. Write these numbers down and take them in with you to PCP visit. Follow up with PCP in 7-10 days after discharge to home. Check chemistry at that visit.   Increase activity slowly   Complete by: As directed      Allergies as of 03/24/2020   No Known Allergies     Medication List    STOP taking these medications   BD Pen Needle Nano U/F 32G X 4 MM Misc Generic drug: Insulin Pen Needle   irbesartan 300 MG tablet Commonly known as: AVAPRO   Naproxen DR 500 MG EC tablet Generic drug: naproxen     TAKE these medications   amLODipine 10 MG tablet Commonly known as: NORVASC Take 10 mg by mouth daily.   atorvastatin 10 MG tablet Commonly known as: LIPITOR Take 10 mg by mouth daily.   furosemide 40 MG tablet Commonly known as: LASIX Take 0.5 tablets (20 mg total) by mouth daily. fluid What changed:   how much to take  when to take this  reasons to take this   HYDROcodone-acetaminophen 7.5-325 MG tablet Commonly known as: NORCO Take 1  tablet by mouth 3 (three) times daily as needed for moderate pain or severe pain.   insulin glargine 100 UNIT/ML injection Commonly known as: LANTUS Inject 0.1 mLs (10 Units total) into the skin daily. Give only 5 units for am glucose less than 90. Start taking on: March 25, 2020 What changed:   how much to take  when to take this  additional instructions   melatonin 3 MG Tabs tablet Take 1 tablet (3 mg total) by mouth at bedtime.   metFORMIN 500 MG 24 hr tablet Commonly known as: GLUCOPHAGE-XR Take 500 mg by mouth in the morning and at bedtime.   Multivitamin Adults Tabs Take 1 tablet by mouth  daily.   OZEMPIC (1 MG/DOSE) [place] Inject 1 mg into the skin once a week. Wed   pantoprazole 40 MG tablet Commonly known as: PROTONIX Take 1 tablet (40 mg total) by mouth 2 (two) times daily.   Potassium Chloride ER 20 MEQ Tbcr Take 20 mEq by mouth daily.   Vitamin D3 25 MCG (1000 UT) Caps Take 1 capsule by mouth daily.      No Known Allergies  The results of significant diagnostics from this hospitalization (including imaging, microbiology, ancillary and laboratory) are listed below for reference.    Significant Diagnostic Studies: DG Pelvis Portable  Result Date: 03/21/2020 CLINICAL DATA:  Pelvic pain after fall EXAM: PORTABLE PELVIS 1-2 VIEWS COMPARISON:  11/10/2014 FINDINGS: There is no evidence of pelvic fracture or diastasis. Moderate arthropathy of the bilateral hips. Degenerative changes within the visualized lower lumbar spine with syndesmophytes and interspinous ligament ossification suggesting ankylosing spondylitis. IMPRESSION: 1. No acute osseous abnormality. 2. 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 Electronically Signed   By: Davina Poke D.O.   On: 03/21/2020 12:28   DG Chest Port 1 View  Result Date: 03/21/2020 CLINICAL DATA:  Weakness, fall EXAM: PORTABLE CHEST 1 VIEW COMPARISON:  12/24/2019 FINDINGS: The heart size and mediastinal  contours are within normal limits. Chronic elevation of the right hemidiaphragm with associated right basilar scarring. No new focal airspace consolidation. No pleural effusion or pneumothorax. Degenerative changes of the shoulders, left worse than right. The visualized skeletal structures are unremarkable. IMPRESSION: 1. No active cardiopulmonary disease. 2. Chronic elevation of the right hemidiaphragm with associated right basilar scarring. Electronically Signed   By: Davina Poke D.O.   On: 03/21/2020 12:35   DG Swallowing Func-Speech Pathology  Result Date: 03/22/2020 Objective Swallowing Evaluation: Type of Study: MBS-Modified Barium Swallow Study  Patient Details Name: Jay Holloway MRN: [id number] Date of Birth: [birth year] Today's Date: 03/22/2020 Time: SLP Start Time (ACUTE ONLY): 3903 -SLP Stop Time (ACUTE ONLY): 1425 SLP Time Calculation (min) (ACUTE ONLY): 20 min Past Medical History: Past Medical History: Diagnosis Date . Arthritis   trouble turning head side to side and up . Diabetes mellitus without complication ([place])  . Hemorrhoids  . High cholesterol  . Hypertension  . Prostate cancer [facility])  Past Surgical History: Past Surgical History: Procedure Laterality Date . CHOLECYSTECTOMY N/A 11/11/2014  Procedure: LAPAROSCOPIC CHOLECYSTECTOMY WITH INTRAOPERATIVE CHOLANGIOGRAM;  Surgeon: Erroll Luna, MD;  Location: [facility];  Service: General;  Laterality: N/A; . COLONOSCOPY N/A 11/09/2014  Procedure: COLONOSCOPY;  Surgeon: Juanita Craver, MD;  Location[facility] ENDOSCOPY;  Service: Endoscopy;  Laterality: N/A; . CYSTOSCOPY WITH RETROGRADE PYELOGRAM, URETEROSCOPY AND STENT PLACEMENT Left 04/18/2014  Procedure: CYSTOSCOPY WITH RETROGRADE PYELOGRAM, URETEROSCOPY AND STENT PLACEMENT;  Surgeon: Raynelle Bring, MD;  Location: [facility] ORS;  Service: Urology;  Laterality: Left; . HOLMIUM LASER APPLICATION Left 0/03/2329  Procedure: HOLMIUM LASER APPLICATION;  Surgeon: Raynelle Bring, MD;  Location: [facility] ORS;  Service: Urology;   Laterality: Left; . LYMPHADENECTOMY  06/04/2012  Procedure: LYMPHADENECTOMY;  Surgeon: Dutch Gray, MD;  Location: [facility] ORS;  Service: Urology;  Laterality: Bilateral; . right  achilles tendon repair  yrs ago . ROBOT ASSISTED LAPAROSCOPIC RADICAL PROSTATECTOMY  06/04/2012  Procedure: ROBOTIC ASSISTED LAPAROSCOPIC RADICAL PROSTATECTOMY LEVEL 3;  Surgeon: Dutch Gray, MD;  Location: [facility]S;  Service: Urology;  Laterality: N/A; HPI: Pt is a 74 y/o male admitted secondary to fall from bed. Also found to be dehydrated. PMH includes HTN, prostate cancer, DM.  Subjective: pleasant, oriented Assessment / Plan /  Recommendation CHL IP CLINICAL IMPRESSIONS 03/22/2020 Clinical Impression Patient presents with a mild-mod oral and a moderate pharyngeal dysphagia. During oral phase, patient exhibited delays in mastication and oral manipulation and anterior to posterior propulsion of regular solids, puree solids and minimally with thin liquids. Pharyngeal phase was characterized by swallow initiation delays to vallecular sinus, majority of regular solids and puree solids bolus remaining in vallecular sinus post initial swallow. Vallecular residual did not transit with second cued swallow but with thin liquids sip, approximately 70% of vallecular residuals transited out. One instance of penetration with full clearance of penetrate was observed with thin liquids sips and occured during the swallow. Patient exhibited decreased hyoid-laryngeal movement, decreased base of tongue strength as well as osteophytes which were pushing into pharynx and causing a structural narrowing of pharyngeal space. SLP Visit Diagnosis Dysphagia, oropharyngeal phase (R13.12) Attention and concentration deficit following -- Frontal lobe and executive function deficit following -- Impact on safety and function Mild aspiration risk   CHL IP TREATMENT RECOMMENDATION 03/22/2020 Treatment Recommendations Therapy as outlined in treatment plan below   Prognosis 03/22/2020  Prognosis for Safe Diet Advancement Good Barriers to Reach Goals -- Barriers/Prognosis Comment -- CHL IP DIET RECOMMENDATION 03/22/2020 SLP Diet Recommendations Dysphagia 1 (Puree) solids;Thin liquid Liquid Administration via Cup;Straw Medication Administration Crushed with puree Compensations Minimize environmental distractions;Small sips/bites;Slow rate;Follow solids with liquid;Multiple dry swallows after each bite/sip Postural Changes Remain semi-upright after after feeds/meals (Comment);Seated upright at 90 degrees   CHL IP OTHER RECOMMENDATIONS 03/22/2020 Recommended Consults -- Oral Care Recommendations Oral care BID Other Recommendations --   CHL IP FOLLOW UP RECOMMENDATIONS 03/22/2020 Follow up Recommendations Skilled Nursing facility   [facility] IP FREQUENCY AND DURATION 03/22/2020 Speech Therapy Frequency (ACUTE ONLY) min 2x/week Treatment Duration 1 week      CHL IP ORAL PHASE 03/22/2020 Oral Phase Impaired Oral - Pudding Teaspoon -- Oral - Pudding Cup -- Oral - Honey Teaspoon -- Oral - Honey Cup -- Oral - Nectar Teaspoon -- Oral - Nectar Cup -- Oral - Nectar Straw -- Oral - Thin Teaspoon -- Oral - Thin Cup Reduced posterior propulsion;Delayed oral transit;Weak lingual manipulation;Premature spillage Oral - Thin Straw Weak lingual manipulation;Delayed oral transit;Reduced posterior propulsion;Premature spillage Oral - Puree Weak lingual manipulation;Delayed oral transit;Reduced posterior propulsion Oral - Mech Soft -- Oral - Regular Reduced posterior propulsion;Weak lingual manipulation;Impaired mastication;Delayed oral transit Oral - Multi-Consistency -- Oral - Pill Delayed oral transit;Reduced posterior propulsion;Weak lingual manipulation Oral Phase - Comment --  CHL IP PHARYNGEAL PHASE 03/22/2020 Pharyngeal Phase Impaired Pharyngeal- Pudding Teaspoon -- Pharyngeal -- Pharyngeal- Pudding Cup -- Pharyngeal -- Pharyngeal- Honey Teaspoon -- Pharyngeal -- Pharyngeal- Honey Cup -- Pharyngeal -- Pharyngeal- Nectar  Teaspoon -- Pharyngeal -- Pharyngeal- Nectar Cup -- Pharyngeal -- Pharyngeal- Nectar Straw -- Pharyngeal -- Pharyngeal- Thin Teaspoon -- Pharyngeal -- Pharyngeal- Thin Cup Delayed swallow initiation-vallecula;Penetration/Aspiration during swallow;Pharyngeal residue - valleculae Pharyngeal Material enters airway, remains ABOVE vocal cords then ejected out Pharyngeal- Thin Straw Delayed swallow initiation-vallecula;Pharyngeal residue - valleculae Pharyngeal -- Pharyngeal- Puree Delayed swallow initiation-vallecula;Reduced laryngeal elevation;Reduced tongue base retraction;Pharyngeal residue - valleculae Pharyngeal -- Pharyngeal- Mechanical Soft -- Pharyngeal -- Pharyngeal- Regular Delayed swallow initiation-vallecula;Pharyngeal residue - valleculae Pharyngeal -- Pharyngeal- Multi-consistency -- Pharyngeal -- Pharyngeal- Pill Delayed swallow initiation-vallecula;Pharyngeal residue - valleculae Pharyngeal -- Pharyngeal Comment --  CHL IP CERVICAL ESOPHAGEAL PHASE 03/22/2020 Cervical Esophageal Phase WFL Pudding Teaspoon -- Pudding Cup -- Honey Teaspoon -- Honey Cup -- Nectar Teaspoon -- Nectar Cup -- Nectar Straw -- Thin Teaspoon --  Thin Cup -- Thin Straw -- Puree -- Mechanical Soft -- Regular -- Multi-consistency -- Pill -- Cervical Esophageal Comment -- Sonia Baller, MA, CCC-SLP Speech Therapy [facility] Acute Rehab Pager: [phone]              Microbiology: Recent Results (from the past 240 hour(s))  SARS Coronavirus 2 by RT PCR (hospital order, performed in [facility] hospital lab) Nasopharyngeal Nasopharyngeal Swab     Status: None   Collection Time: 03/21/20  6:12 PM   Specimen: Nasopharyngeal Swab  Result Value Ref Range Status   SARS Coronavirus 2 NEGATIVE NEGATIVE Final    Comment: (NOTE) SARS-CoV-2 target nucleic acids are NOT DETECTED.  The SARS-CoV-2 RNA is generally detectable in upper and lower respiratory specimens during the acute phase of infection. The lowest concentration of SARS-CoV-2 viral  copies this assay can detect is 250 copies / mL. A negative result does not preclude SARS-CoV-2 infection and should not be used as the sole basis for treatment or other patient management decisions.  A negative result may occur with improper specimen collection / handling, submission of specimen other than nasopharyngeal swab, presence of viral mutation(s) within the areas targeted by this assay, and inadequate number of viral copies (<250 copies / mL). A negative result must be combined with clinical observations, patient history, and epidemiological information.  Fact Sheet for Patients:   StrictlyIdeas.no  Fact Sheet for Healthcare Providers: BankingDealers.co.za  This test is not yet approved or  cleared by the [place] FDA and has been authorized for detection and/or diagnosis of SARS-CoV-2 by FDA under an Emergency Use Authorization (EUA).  This EUA will remain in effect (meaning this test can be used) for the duration of the COVID-19 declaration under Section 564(b)(1) of the Act, 21 U.S.C. section 360bbb-3(b)(1), unless the authorization is terminated or revoked sooner.  Performed at [facility], [place] [address] [place],  [address]      Labs: Basic Metabolic Panel: Recent Labs  Lab 03/21/20 1700 03/21/20 2023 03/22/20 0127 03/22/20 0644 03/23/20 0716  NA 140 140 141 141 145  K 4.5 5.0 4.4 4.4 3.6  CL 100 102 104 105 107  CO2 15* 21* 25 26 29   GLUCOSE 253* 172* 180* 148* 106*  BUN 25* 24* 31* 34* 15  CREATININE 2.00* 1.70* 1.45* 1.20 0.92  CALCIUM 9.1 9.0 9.0 9.2 9.2   Liver Function Tests: Recent Labs  Lab 03/21/20 1257  AST 33  ALT 16  ALKPHOS 80  BILITOT 3.2*  PROT 6.8  ALBUMIN 3.8   Recent Labs  Lab 03/21/20 1257  LIPASE 19   No results for input(s): AMMONIA in the last 168 hours. CBC: Recent Labs  Lab 03/21/20 1722 03/23/20 0716  WBC 13.5* 10.0  NEUTROABS 10.0* 6.2  HGB  12.5* 11.0*  HCT 39.9 34.9*  MCV 92.6 90.9  PLT 234 207   Cardiac Enzymes: Recent Labs  Lab 03/21/20 1257  CKTOTAL 96   BNP: BNP (last 3 results) Recent Labs    12/24/19 2100  BNP 57.8    ProBNP (last 3 results) No results for input(s): PROBNP in the last 8760 hours.  CBG: Recent Labs  Lab 03/23/20 2030 03/23/20 2309 03/24/20 0347 03/24/20 0757 03/24/20 1128  GLUCAP 110* 104* 116* 136* 226*    Active Problems:   DKA (diabetic ketoacidoses) ([place])   Type 2 diabetes mellitus with ketoacidosis without coma ([place])   Weakness   Prostate cancer ([place])   Hypertension  High cholesterol   AKI (acute kidney injury) ([place])   Time coordinating discharge: 38 minutes.  Signed:        Natosha Bou, DO [facility]  03/24/2020, 2:14 PM

## 2020-03-24 NOTE — NC FL2 (Signed)
[place] MEDICAID FL2 LEVEL OF CARE SCREENING TOOL     IDENTIFICATION  Patient Name: Jay Holloway Birthdate: 07/16/1946 Sex: male Admission Date (Current Location): 03/21/2020  [facility] and [place] Number:  Herbalist and Address:  [facility], [place] [address] [place], [place] [address]      Provider Number: [id number]  Attending Physician Name and Address:  Karie Kirks, DO  Relative Name and Phone Number:  Holley Bouche [phone]    Current Level of Care: Hospital Recommended Level of Care: [place] Prior Approval Number:    Date Approved/Denied:   PASRR Number: [id number]  Discharge Plan: SNF    Current Diagnoses: Patient Active Problem List   Diagnosis Date Noted  . AKI (acute kidney injury) ([place]) 03/21/2020  . Prostate cancer ([place])   . Hypertension   . High cholesterol   . Sepsis ([place]) 11/08/2014  . Arterial hypotension   . Renal failure   . Type 2 diabetes mellitus with ketoacidosis without coma ([place])   . Weakness   . Bacteremia   . Blood poisoning   . Urinary tract infectious disease   . DKA (diabetic ketoacidoses) ([place]) 11/05/2014    Orientation RESPIRATION BLADDER Height & Weight     Self, Time, Situation, Place  Normal Incontinent, External catheter Weight: 208 lb 15.9 oz (94.8 kg) Height:  6\' 5"  (195.6 cm)  BEHAVIORAL SYMPTOMS/MOOD NEUROLOGICAL BOWEL NUTRITION STATUS      Continent Diet (See DC summary)  AMBULATORY STATUS COMMUNICATION OF NEEDS Skin   Extensive Assist Verbally Normal                       Personal Care Assistance Level of Assistance  Bathing, Feeding, Dressing Bathing Assistance: Limited assistance Feeding assistance: Independent Dressing Assistance: Limited assistance     Functional Limitations Info  Sight, Hearing, Speech Sight Info: Adequate Hearing Info: Adequate Speech Info: Adequate    SPECIAL CARE FACTORS FREQUENCY  PT (By licensed PT), OT (By licensed  OT)     PT Frequency: 5x/week OT Frequency: 5x/week            Contractures Contractures Info: Not present    Additional Factors Info  Code Status, Allergies Code Status Info: Full Allergies Info: No Known Allergies           Current Medications (03/24/2020):  This is the current hospital active medication list Current Facility-Administered Medications  Medication Dose Route Frequency Provider Last Rate Last Admin  . 0.9 %  sodium chloride infusion   Intravenous Continuous Darliss Cheney, MD 100 mL/hr at 03/24/20 1255 New Bag at 03/24/20 1255  . 0.9 %  sodium chloride infusion   Intravenous PRN Darliss Cheney, MD      . amLODipine (NORVASC) tablet 10 mg  10 mg Oral Daily Carmin Muskrat, MD   10 mg at 03/24/20 9937  . atorvastatin (LIPITOR) tablet 10 mg  10 mg Oral Daily Carmin Muskrat, MD   10 mg at 03/24/20 1696  . cholecalciferol (VITAMIN D3) tablet 1,000 Units  1,000 Units Oral Daily Carmin Muskrat, MD   1,000 Units at 03/24/20 [phone]  . dextrose 50 % solution 0-50 mL  0-50 mL Intravenous PRN Karmen Bongo, MD      . enoxaparin (LOVENOX) injection 40 mg  40 mg Subcutaneous Q24H Karmen Bongo, MD   40 mg at 03/23/20 1656  . food thickener (THICK IT) powder   Oral PRN Darliss Cheney, MD      .  HYDROcodone-acetaminophen (NORCO) 7.5-325 MG per tablet 1 tablet  1 tablet Oral TID PRN Carmin Muskrat, MD   1 tablet at 03/24/20 0715  . insulin aspart (novoLOG) injection 0-15 Units  0-15 Units Subcutaneous Q4H Darliss Cheney, MD   5 Units at 03/24/20 1225  . insulin glargine (LANTUS) injection 10 Units  10 Units Subcutaneous Daily Darliss Cheney, MD   10 Units at 03/24/20 1017  . melatonin tablet 3 mg  3 mg Oral QHS Opyd, Ilene Qua, MD   3 mg at 03/24/20 0048  . pantoprazole (PROTONIX) EC tablet 40 mg  40 mg Oral BID Darliss Cheney, MD   40 mg at 03/24/20 1937     Discharge Medications: Please see discharge summary for a list of discharge medications.  Relevant Imaging  Results:  Relevant Lab Results:   Additional Information [place] Edel Rivero, LCSWA

## 2020-03-24 NOTE — Plan of Care (Signed)

## 2020-03-24 NOTE — Progress Notes (Signed)
D/C instructions printed and placed in packet at nurse's station for transport to SNF. Tele and IV removed. Tolerated well.

## 2020-03-24 NOTE — Social Work (Signed)
8/27:CSW tried to reach out to family contact, Holley Bouche [phone] in ref to pt dc plans, had to leave a vm.

## 2020-03-24 NOTE — Progress Notes (Signed)
Inpatient Diabetes Program Recommendations  AACE/ADA: New Consensus Statement on Inpatient Glycemic Control (2015)  Target Ranges:  Prepandial:   less than 140 mg/dL      Peak postprandial:   less than 180 mg/dL (1-2 hours)      Critically ill patients:  140 - 180 mg/dL   Lab Results  Component Value Date   GLUCAP 226 (H) 03/24/2020   HGBA1C 6.2 (H) 03/21/2020    Review of Glycemic Control Results for Jay Holloway, Jay Holloway (MRN [id number]) as of 03/24/2020 13:51  Ref. Range 03/23/2020 23:09 03/24/2020 03:47 03/24/2020 07:57 03/24/2020 11:28  Glucose-Capillary Latest Ref Range: 70 - 99 mg/dL 104 (H) 116 (H) 136 (H) 226 (H)  Diabetes history: DM 2 Outpatient Diabetes medications:  Lantus 9 units (patient was not taking), Metformin 500 mg bid, Ozempic 1 mg weekly Current orders for Inpatient glycemic control:  Lantus 10 units daily, Novolog moderate q 4 hours Inpatient Diabetes Program Recommendations:    Spoke to patient regarding home DM medications.  He states that he was told to not take his Lantus if blood sugar <110 mg/dL. Therefore he did not take Lantus for several days.   May need to change parameters for insulin hold Lantus only if CBG <90 mg/dL.  He seems to need basal insulin daily.  Encouraged him to go by his blood sugar first thing in the morning to determine if he should take insulin.  I also asked him to check his blood sugars in the evening as well.   Unsure why blood sugars were running so low in the mornings at home??   - please change Novolog correction to tid with meals and HS scale.   Thanks,  Adah Perl, RN, BC-ADM Inpatient Diabetes Coordinator Pager [phone]

## 2020-03-24 NOTE — NC FL2 (Signed)
[place] MEDICAID FL2 LEVEL OF CARE SCREENING TOOL     IDENTIFICATION  Patient Name: Jay Holloway Birthdate: 04/01/1946 Sex: male Admission Date (Current Location): 03/21/2020  [facility] and [place] Number:  Herbalist and Address:  The . [facility], [place] [address] [place], [place] [address]      Provider Number: [id number]  Attending Physician Name and Address:  Karie Kirks, DO  Relative Name and Phone Number:  Holley Bouche [phone]    Current Level of Care: Hospital Recommended Level of Care: [place] Prior Approval Number:    Date Approved/Denied:   PASRR Number: [id number]  Discharge Plan: SNF    Current Diagnoses: Patient Active Problem List   Diagnosis Date Noted  . AKI (acute kidney injury) ([place]) 03/21/2020  . Prostate cancer ([place])   . Hypertension   . High cholesterol   . Sepsis ([place]) 11/08/2014  . Arterial hypotension   . Renal failure   . Type 2 diabetes mellitus with ketoacidosis without coma ([place])   . Weakness   . Bacteremia   . Blood poisoning   . Urinary tract infectious disease   . DKA (diabetic ketoacidoses) ([place]) 11/05/2014    Orientation RESPIRATION BLADDER Height & Weight     Self, Time, Situation, Place  Normal Incontinent, External catheter Weight: 94.8 kg Height:  6\' 5"  (195.6 cm)  BEHAVIORAL SYMPTOMS/MOOD NEUROLOGICAL BOWEL NUTRITION STATUS      Continent Diet (See DC summary)  AMBULATORY STATUS COMMUNICATION OF NEEDS Skin   Extensive Assist Verbally Normal                       Personal Care Assistance Level of Assistance  Bathing, Feeding, Dressing Bathing Assistance: Limited assistance Feeding assistance: Independent Dressing Assistance: Limited assistance     Functional Limitations Info  Sight, Hearing, Speech Sight Info: Adequate Hearing Info: Adequate Speech Info: Adequate    SPECIAL CARE FACTORS FREQUENCY  PT (By licensed PT), OT (By licensed OT)     PT  Frequency: 5x/week OT Frequency: 5x/week            Contractures Contractures Info: Not present    Additional Factors Info  Code Status, Allergies Code Status Info: Full Allergies Info: No Known Allergies           Current Medications (03/24/2020):  This is the current hospital active medication list Current Facility-Administered Medications  Medication Dose Route Frequency Provider Last Rate Last Admin  . 0.9 %  sodium chloride infusion   Intravenous Continuous Darliss Cheney, MD 100 mL/hr at 03/24/20 1255 New Bag at 03/24/20 1255  . 0.9 %  sodium chloride infusion   Intravenous PRN Darliss Cheney, MD      . amLODipine (NORVASC) tablet 10 mg  10 mg Oral Daily Carmin Muskrat, MD   10 mg at 03/24/20 1884  . atorvastatin (LIPITOR) tablet 10 mg  10 mg Oral Daily Carmin Muskrat, MD   10 mg at 03/24/20 1660  . cholecalciferol (VITAMIN D3) tablet 1,000 Units  1,000 Units Oral Daily Carmin Muskrat, MD   1,000 Units at 03/24/20 [phone]  . dextrose 50 % solution 0-50 mL  0-50 mL Intravenous PRN Karmen Bongo, MD      . enoxaparin (LOVENOX) injection 40 mg  40 mg Subcutaneous Q24H Karmen Bongo, MD   40 mg at 03/23/20 1656  . food thickener (THICK IT) powder   Oral PRN Darliss Cheney, MD      .  HYDROcodone-acetaminophen (NORCO) 7.5-325 MG per tablet 1 tablet  1 tablet Oral TID PRN Carmin Muskrat, MD   1 tablet at 03/24/20 0715  . insulin aspart (novoLOG) injection 0-15 Units  0-15 Units Subcutaneous Q4H Darliss Cheney, MD   5 Units at 03/24/20 1225  . insulin glargine (LANTUS) injection 10 Units  10 Units Subcutaneous Daily Darliss Cheney, MD   10 Units at 03/24/20 1017  . melatonin tablet 3 mg  3 mg Oral QHS Opyd, Ilene Qua, MD   3 mg at 03/24/20 0048  . pantoprazole (PROTONIX) EC tablet 40 mg  40 mg Oral BID Darliss Cheney, MD   40 mg at 03/24/20 6834     Discharge Medications: Please see discharge summary for a list of discharge medications.  Relevant Imaging Results:  Relevant  Lab Results:   Additional Information [phone]  Zenon Mayo, RN

## 2020-03-24 NOTE — TOC Initial Note (Signed)
Transition of Care [facility]) - Initial/Assessment Note    Patient Details  Name: Jay Holloway MRN: [id number] Date of Birth: Aug 10, 1945  [facility]) CM/SW Contact:    [address], [place] Phone Number: 03/24/2020, 1:17 PM  Clinical Narrative:                 CSW received consult for possible SNF placement at time of discharge. CSW spoke with patient regarding PT recommendation of SNF placement at time of discharge. Patient expressed understanding of PT recommendation and is agreeable to SNF placement at time of discharge. Patient reports that he does not want to go back to [facility]. CSW discussed insurance authorization process and provided Medicare SNF ratings list. Patient expressed being hopeful for rehab and to feel better soon. No further questions reported at this time. Patient is vaccinated. CSW to continue to follow and assist with discharge planning needs.   Expected Discharge Plan: Long Term Nursing Home Barriers to Discharge: Continued Medical Work up   Patient Goals and CMS Choice Patient states their goals for this hospitalization and ongoing recovery are:: Regain strength CMS Medicare.gov Compare Post Acute Care list provided to:: Patient Choice offered to / list presented to : Patient  Expected Discharge Plan and Services Expected Discharge Plan: [place] Acute Care Choice: [place] Living arrangements for the past 2 months: Single Family Home                                      Prior Living Arrangements/Services Living arrangements for the past 2 months: Single Family Home Lives with:: Self Patient language and need for interpreter reviewed:: No (not needed) Do you feel safe going back to the place where you live?: Yes      Need for Family Participation in Patient Care: No (Comment) Care giver support system in place?: No (comment) Current home services: Home RN Criminal Activity/Legal  Involvement Pertinent to Current Situation/Hospitalization: No - Comment as needed  Activities of Daily Living Home Assistive Devices/Equipment: CBG Meter, Cane (specify quad or straight) ADL Screening (condition at time of admission) Patient's cognitive ability adequate to safely complete daily activities?: Yes Is the patient deaf or have difficulty hearing?: No Does the patient have difficulty seeing, even when wearing glasses/contacts?: No Does the patient have difficulty concentrating, remembering, or making decisions?: No Patient able to express need for assistance with ADLs?: Yes Does the patient have difficulty dressing or bathing?: Yes Independently performs ADLs?: Yes (appropriate for developmental age) Does the patient have difficulty walking or climbing stairs?: Yes Weakness of Legs: Both Weakness of Arms/Hands: Both  Permission Sought/Granted Permission sought to share information with : Family Supports Holley Bouche [phone]) Permission granted to share information with : Yes, Verbal Permission Granted  Share Information with NAME:  Holley Bouche [phone])  Permission granted to share info w AGENCY: SNF  Permission granted to share info w Relationship: Friend  Permission granted to share info w Contact Information: [phone]  Emotional Assessment Appearance:: Appears stated age   Affect (typically observed): Calm Orientation: : Oriented to Self, Oriented to Place, Oriented to  Time, Oriented to Situation Alcohol / Substance Use: Not Applicable Psych Involvement: No (comment)  Admission diagnosis:  Dehydration [E86.0] DKA (diabetic ketoacidoses) ([place]) [E11.10] DKA (diabetic ketoacidosis) ([place]) [E11.10] AKI (acute kidney injury) ([place]) [N17.9] Fall, initial encounter [W19.XXXA] Patient Active Problem  List   Diagnosis Date Noted  . AKI (acute kidney injury) ([place]) 03/21/2020  . Prostate cancer ([place])   . Hypertension   . High cholesterol   . Sepsis ([place])  11/08/2014  . Arterial hypotension   . Renal failure   . Type 2 diabetes mellitus with ketoacidosis without coma ([place])   . Weakness   . Bacteremia   . Blood poisoning   . Urinary tract infectious disease   . DKA (diabetic ketoacidoses) ([place]) 11/05/2014   PCP:  [name], MD Pharmacy:   [facility], [place] AT [address] [place] [address] Phone: [phone] Fax: [phone]  [facility] [address] [place]), [place] [address] [place] [address] Phone: [phone] Fax: [phone]     Social Determinants of Health (SDOH) Interventions    Readmission Risk Interventions No flowsheet data found.

## 2020-03-24 NOTE — Social Work (Signed)
8/27: Patient will DC to: [place] Anticipated DC date: 03/24/20 Family notified: Holley Bouche Transport by: Corey Harold   Per MD patient ready for DC to [facility] . RN to call report prior to discharge ([phone]). RN, patient, patient's family, and facility notified of DC. Discharge Summary and FL2 sent to facility. DC packet on chart. Ambulance transport requested for patient.   CSW will sign off for now as social work intervention is no longer needed. Please consult [place] again if new needs arise.

## 2020-03-24 NOTE — TOC Progression Note (Signed)
Transition of Care [facility]) - Progression Note    Patient Details  Name: Jay Holloway MRN: [id number] Date of Birth: Mar 03, 1946  Transition of Care [facility]) CM/SW [place], [place] Phone Number: 03/24/2020, 1:22 PM  Clinical Narrative:    CSW spoke with [place] at [facility], stated she does have bed available. Adv pt, he requested a hospital bed as he is taller, per [place] a hospital bed is available.    Expected Discharge Plan: Long Term Nursing Home Barriers to Discharge: Continued Medical Work up  Expected Discharge Plan and Services Expected Discharge Plan: [place] Acute Care Choice: [place] arrangements for the past 2 months: Single Family Home                                       Social Determinants of Health (SDOH) Interventions    Readmission Risk Interventions No flowsheet data found.

## 2020-03-24 NOTE — Progress Notes (Signed)
Patient requesting medication for sleep and pain. Provider paged.

## 2020-03-25 DIAGNOSIS — K219 Gastro-esophageal reflux disease without esophagitis: Secondary | ICD-10-CM | POA: Diagnosis not present

## 2020-03-25 DIAGNOSIS — E860 Dehydration: Secondary | ICD-10-CM | POA: Diagnosis not present

## 2020-03-25 DIAGNOSIS — E119 Type 2 diabetes mellitus without complications: Secondary | ICD-10-CM | POA: Diagnosis not present

## 2020-03-25 DIAGNOSIS — M2550 Pain in unspecified joint: Secondary | ICD-10-CM | POA: Diagnosis not present

## 2020-03-25 DIAGNOSIS — Z9181 History of falling: Secondary | ICD-10-CM | POA: Diagnosis not present

## 2020-03-25 DIAGNOSIS — N179 Acute kidney failure, unspecified: Secondary | ICD-10-CM | POA: Diagnosis not present

## 2020-03-25 DIAGNOSIS — Z7401 Bed confinement status: Secondary | ICD-10-CM | POA: Diagnosis not present

## 2020-03-25 DIAGNOSIS — E1110 Type 2 diabetes mellitus with ketoacidosis without coma: Secondary | ICD-10-CM | POA: Diagnosis not present

## 2020-03-25 DIAGNOSIS — E1165 Type 2 diabetes mellitus with hyperglycemia: Secondary | ICD-10-CM | POA: Diagnosis not present

## 2020-03-25 DIAGNOSIS — C61 Malignant neoplasm of prostate: Secondary | ICD-10-CM | POA: Diagnosis not present

## 2020-03-25 DIAGNOSIS — I10 Essential (primary) hypertension: Secondary | ICD-10-CM | POA: Diagnosis not present

## 2020-03-25 DIAGNOSIS — M6281 Muscle weakness (generalized): Secondary | ICD-10-CM | POA: Diagnosis not present

## 2020-03-25 LAB — GLUCOSE, CAPILLARY: Glucose-Capillary: 172 mg/dL — ABNORMAL HIGH (ref 70–99)

## 2020-03-27 DIAGNOSIS — N179 Acute kidney failure, unspecified: Secondary | ICD-10-CM | POA: Diagnosis not present

## 2020-03-27 DIAGNOSIS — E1110 Type 2 diabetes mellitus with ketoacidosis without coma: Secondary | ICD-10-CM | POA: Diagnosis not present

## 2020-03-27 DIAGNOSIS — K219 Gastro-esophageal reflux disease without esophagitis: Secondary | ICD-10-CM | POA: Diagnosis not present

## 2020-03-27 DIAGNOSIS — E860 Dehydration: Secondary | ICD-10-CM | POA: Diagnosis not present

## 2020-03-29 DIAGNOSIS — N179 Acute kidney failure, unspecified: Secondary | ICD-10-CM | POA: Diagnosis not present

## 2020-03-29 DIAGNOSIS — M6281 Muscle weakness (generalized): Secondary | ICD-10-CM | POA: Diagnosis not present

## 2020-03-29 DIAGNOSIS — Z9181 History of falling: Secondary | ICD-10-CM | POA: Diagnosis not present

## 2020-03-29 DIAGNOSIS — I10 Essential (primary) hypertension: Secondary | ICD-10-CM | POA: Diagnosis not present

## 2020-03-29 DIAGNOSIS — E119 Type 2 diabetes mellitus without complications: Secondary | ICD-10-CM | POA: Diagnosis not present

## 2020-03-29 DIAGNOSIS — C61 Malignant neoplasm of prostate: Secondary | ICD-10-CM | POA: Diagnosis not present

## 2020-03-29 DIAGNOSIS — E1110 Type 2 diabetes mellitus with ketoacidosis without coma: Secondary | ICD-10-CM | POA: Diagnosis not present

## 2020-03-29 DIAGNOSIS — K219 Gastro-esophageal reflux disease without esophagitis: Secondary | ICD-10-CM | POA: Diagnosis not present

## 2020-03-29 DIAGNOSIS — E860 Dehydration: Secondary | ICD-10-CM | POA: Diagnosis not present

## 2020-04-12 DIAGNOSIS — Z87891 Personal history of nicotine dependence: Secondary | ICD-10-CM | POA: Diagnosis not present

## 2020-04-12 DIAGNOSIS — Z8546 Personal history of malignant neoplasm of prostate: Secondary | ICD-10-CM | POA: Diagnosis not present

## 2020-04-12 DIAGNOSIS — Z9181 History of falling: Secondary | ICD-10-CM | POA: Diagnosis not present

## 2020-04-12 DIAGNOSIS — K649 Unspecified hemorrhoids: Secondary | ICD-10-CM | POA: Diagnosis not present

## 2020-04-12 DIAGNOSIS — M1990 Unspecified osteoarthritis, unspecified site: Secondary | ICD-10-CM | POA: Diagnosis not present

## 2020-04-12 DIAGNOSIS — E119 Type 2 diabetes mellitus without complications: Secondary | ICD-10-CM | POA: Diagnosis not present

## 2020-04-12 DIAGNOSIS — I10 Essential (primary) hypertension: Secondary | ICD-10-CM | POA: Diagnosis not present

## 2020-04-12 DIAGNOSIS — E785 Hyperlipidemia, unspecified: Secondary | ICD-10-CM | POA: Diagnosis not present

## 2020-04-14 ENCOUNTER — Inpatient Hospital Stay (HOSPITAL_COMMUNITY)
Admission: EM | Admit: 2020-04-14 | Discharge: 2020-04-19 | DRG: 637 | Disposition: A | Discharge status: Skilled Nursing Facility | Payer: Medicare HMO | Attending: Internal Medicine | Admitting: Internal Medicine

## 2020-04-14 ENCOUNTER — Inpatient Hospital Stay (HOSPITAL_COMMUNITY): Payer: Medicare HMO

## 2020-04-14 ENCOUNTER — Encounter (HOSPITAL_COMMUNITY): Payer: Self-pay | Admitting: Emergency Medicine

## 2020-04-14 ENCOUNTER — Emergency Department (HOSPITAL_COMMUNITY): Payer: Medicare HMO

## 2020-04-14 ENCOUNTER — Other Ambulatory Visit: Payer: Self-pay

## 2020-04-14 DIAGNOSIS — E860 Dehydration: Secondary | ICD-10-CM | POA: Diagnosis present

## 2020-04-14 DIAGNOSIS — Z6828 Body mass index (BMI) 28.0-28.9, adult: Secondary | ICD-10-CM

## 2020-04-14 DIAGNOSIS — I452 Bifascicular block: Secondary | ICD-10-CM | POA: Diagnosis present

## 2020-04-14 DIAGNOSIS — E785 Hyperlipidemia, unspecified: Secondary | ICD-10-CM | POA: Diagnosis present

## 2020-04-14 DIAGNOSIS — M1990 Unspecified osteoarthritis, unspecified site: Secondary | ICD-10-CM | POA: Diagnosis present

## 2020-04-14 DIAGNOSIS — N182 Chronic kidney disease, stage 2 (mild): Secondary | ICD-10-CM | POA: Diagnosis present

## 2020-04-14 DIAGNOSIS — Z9079 Acquired absence of other genital organ(s): Secondary | ICD-10-CM

## 2020-04-14 DIAGNOSIS — E870 Hyperosmolality and hypernatremia: Secondary | ICD-10-CM | POA: Diagnosis present

## 2020-04-14 DIAGNOSIS — R4182 Altered mental status, unspecified: Secondary | ICD-10-CM | POA: Diagnosis not present

## 2020-04-14 DIAGNOSIS — Z79899 Other long term (current) drug therapy: Secondary | ICD-10-CM

## 2020-04-14 DIAGNOSIS — I130 Hypertensive heart and chronic kidney disease with heart failure and stage 1 through stage 4 chronic kidney disease, or unspecified chronic kidney disease: Secondary | ICD-10-CM | POA: Diagnosis present

## 2020-04-14 DIAGNOSIS — E875 Hyperkalemia: Secondary | ICD-10-CM | POA: Diagnosis present

## 2020-04-14 DIAGNOSIS — R000 Tachycardia, unspecified: Secondary | ICD-10-CM | POA: Diagnosis present

## 2020-04-14 DIAGNOSIS — E1165 Type 2 diabetes mellitus with hyperglycemia: Secondary | ICD-10-CM

## 2020-04-14 DIAGNOSIS — K219 Gastro-esophageal reflux disease without esophagitis: Secondary | ICD-10-CM | POA: Diagnosis present

## 2020-04-14 DIAGNOSIS — E872 Acidosis, unspecified: Secondary | ICD-10-CM

## 2020-04-14 DIAGNOSIS — I5031 Acute diastolic (congestive) heart failure: Secondary | ICD-10-CM | POA: Diagnosis not present

## 2020-04-14 DIAGNOSIS — R1310 Dysphagia, unspecified: Secondary | ICD-10-CM

## 2020-04-14 DIAGNOSIS — I10 Essential (primary) hypertension: Secondary | ICD-10-CM | POA: Diagnosis present

## 2020-04-14 DIAGNOSIS — R4701 Aphasia: Secondary | ICD-10-CM | POA: Diagnosis present

## 2020-04-14 DIAGNOSIS — R29810 Facial weakness: Secondary | ICD-10-CM | POA: Diagnosis not present

## 2020-04-14 DIAGNOSIS — R410 Disorientation, unspecified: Secondary | ICD-10-CM | POA: Diagnosis not present

## 2020-04-14 DIAGNOSIS — G9341 Metabolic encephalopathy: Secondary | ICD-10-CM | POA: Diagnosis not present

## 2020-04-14 DIAGNOSIS — E1110 Type 2 diabetes mellitus with ketoacidosis without coma: Principal | ICD-10-CM | POA: Diagnosis present

## 2020-04-14 DIAGNOSIS — M6281 Muscle weakness (generalized): Secondary | ICD-10-CM | POA: Diagnosis not present

## 2020-04-14 DIAGNOSIS — E7800 Pure hypercholesterolemia, unspecified: Secondary | ICD-10-CM | POA: Diagnosis present

## 2020-04-14 DIAGNOSIS — C61 Malignant neoplasm of prostate: Secondary | ICD-10-CM | POA: Diagnosis present

## 2020-04-14 DIAGNOSIS — J9601 Acute respiratory failure with hypoxia: Secondary | ICD-10-CM | POA: Diagnosis not present

## 2020-04-14 DIAGNOSIS — M2550 Pain in unspecified joint: Secondary | ICD-10-CM | POA: Diagnosis not present

## 2020-04-14 DIAGNOSIS — Z66 Do not resuscitate: Secondary | ICD-10-CM | POA: Diagnosis not present

## 2020-04-14 DIAGNOSIS — N189 Chronic kidney disease, unspecified: Secondary | ICD-10-CM | POA: Diagnosis not present

## 2020-04-14 DIAGNOSIS — R4781 Slurred speech: Secondary | ICD-10-CM | POA: Insufficient documentation

## 2020-04-14 DIAGNOSIS — R531 Weakness: Secondary | ICD-10-CM

## 2020-04-14 DIAGNOSIS — E1122 Type 2 diabetes mellitus with diabetic chronic kidney disease: Secondary | ICD-10-CM | POA: Diagnosis present

## 2020-04-14 DIAGNOSIS — E663 Overweight: Secondary | ICD-10-CM | POA: Diagnosis present

## 2020-04-14 DIAGNOSIS — Z794 Long term (current) use of insulin: Secondary | ICD-10-CM

## 2020-04-14 DIAGNOSIS — Z20822 Contact with and (suspected) exposure to covid-19: Secondary | ICD-10-CM | POA: Diagnosis not present

## 2020-04-14 DIAGNOSIS — Z8546 Personal history of malignant neoplasm of prostate: Secondary | ICD-10-CM

## 2020-04-14 DIAGNOSIS — L89152 Pressure ulcer of sacral region, stage 2: Secondary | ICD-10-CM | POA: Diagnosis present

## 2020-04-14 DIAGNOSIS — E876 Hypokalemia: Secondary | ICD-10-CM | POA: Diagnosis present

## 2020-04-14 DIAGNOSIS — R1312 Dysphagia, oropharyngeal phase: Secondary | ICD-10-CM | POA: Diagnosis present

## 2020-04-14 DIAGNOSIS — J9600 Acute respiratory failure, unspecified whether with hypoxia or hypercapnia: Secondary | ICD-10-CM | POA: Diagnosis not present

## 2020-04-14 DIAGNOSIS — J9602 Acute respiratory failure with hypercapnia: Secondary | ICD-10-CM | POA: Diagnosis not present

## 2020-04-14 DIAGNOSIS — Z9049 Acquired absence of other specified parts of digestive tract: Secondary | ICD-10-CM

## 2020-04-14 DIAGNOSIS — N183 Chronic kidney disease, stage 3 unspecified: Secondary | ICD-10-CM | POA: Diagnosis not present

## 2020-04-14 DIAGNOSIS — Z87891 Personal history of nicotine dependence: Secondary | ICD-10-CM

## 2020-04-14 DIAGNOSIS — J80 Acute respiratory distress syndrome: Secondary | ICD-10-CM | POA: Diagnosis not present

## 2020-04-14 DIAGNOSIS — N179 Acute kidney failure, unspecified: Secondary | ICD-10-CM | POA: Diagnosis present

## 2020-04-14 DIAGNOSIS — Z7401 Bed confinement status: Secondary | ICD-10-CM | POA: Diagnosis not present

## 2020-04-14 DIAGNOSIS — L89150 Pressure ulcer of sacral region, unstageable: Secondary | ICD-10-CM | POA: Diagnosis not present

## 2020-04-14 LAB — COMPREHENSIVE METABOLIC PANEL WITH GFR
ALT: 21 U/L (ref 0–44)
AST: 20 U/L (ref 15–41)
Albumin: 3.7 g/dL (ref 3.5–5.0)
Alkaline Phosphatase: 75 U/L (ref 38–126)
Anion gap: 24 — ABNORMAL HIGH (ref 5–15)
BUN: 12 mg/dL (ref 8–23)
CO2: 17 mmol/L — ABNORMAL LOW (ref 22–32)
Calcium: 9.3 mg/dL (ref 8.9–10.3)
Chloride: 102 mmol/L (ref 98–111)
Creatinine, Ser: 2.12 mg/dL — ABNORMAL HIGH (ref 0.61–1.24)
GFR calc Af Amer: 35 mL/min — ABNORMAL LOW
GFR calc non Af Amer: 30 mL/min — ABNORMAL LOW
Glucose, Bld: 239 mg/dL — ABNORMAL HIGH (ref 70–99)
Potassium: 5.3 mmol/L — ABNORMAL HIGH (ref 3.5–5.1)
Sodium: 143 mmol/L (ref 135–145)
Total Bilirubin: 2 mg/dL — ABNORMAL HIGH (ref 0.3–1.2)
Total Protein: 6.7 g/dL (ref 6.5–8.1)

## 2020-04-14 LAB — APTT: aPTT: 24 seconds (ref 24–36)

## 2020-04-14 LAB — I-STAT CHEM 8, ED
BUN: 19 mg/dL (ref 8–23)
Calcium, Ion: 1.27 mmol/L (ref 1.15–1.40)
Chloride: 104 mmol/L (ref 98–111)
Creatinine, Ser: 0.9 mg/dL (ref 0.61–1.24)
Glucose, Bld: 231 mg/dL — ABNORMAL HIGH (ref 70–99)
HCT: 43 % (ref 39.0–52.0)
Hemoglobin: 14.6 g/dL (ref 13.0–17.0)
Potassium: 4.9 mmol/L (ref 3.5–5.1)
Sodium: 141 mmol/L (ref 135–145)
TCO2: 19 mmol/L — ABNORMAL LOW (ref 22–32)

## 2020-04-14 LAB — DIFFERENTIAL
Abs Immature Granulocytes: 0.02 10*3/uL (ref 0.00–0.07)
Basophils Absolute: 0 10*3/uL (ref 0.0–0.1)
Basophils Relative: 0 %
Eosinophils Absolute: 0 10*3/uL (ref 0.0–0.5)
Eosinophils Relative: 0 %
Immature Granulocytes: 0 %
Lymphocytes Relative: 28 %
Lymphs Abs: 2.7 10*3/uL (ref 0.7–4.0)
Monocytes Absolute: 0.5 10*3/uL (ref 0.1–1.0)
Monocytes Relative: 6 %
Neutro Abs: 6.4 10*3/uL (ref 1.7–7.7)
Neutrophils Relative %: 66 %

## 2020-04-14 LAB — CBC
HCT: 42.9 % (ref 39.0–52.0)
Hemoglobin: 12.4 g/dL — ABNORMAL LOW (ref 13.0–17.0)
MCH: 28.1 pg (ref 26.0–34.0)
MCHC: 28.9 g/dL — ABNORMAL LOW (ref 30.0–36.0)
MCV: 97.1 fL (ref 80.0–100.0)
Platelets: 282 10*3/uL (ref 150–400)
RBC: 4.42 MIL/uL (ref 4.22–5.81)
RDW: 14.3 % (ref 11.5–15.5)
WBC: 9.7 10*3/uL (ref 4.0–10.5)
nRBC: 0 % (ref 0.0–0.2)

## 2020-04-14 LAB — GLUCOSE, CAPILLARY: Glucose-Capillary: 185 mg/dL — ABNORMAL HIGH (ref 70–99)

## 2020-04-14 LAB — PROTIME-INR
INR: 1 (ref 0.8–1.2)
Prothrombin Time: 12.5 s (ref 11.4–15.2)

## 2020-04-14 LAB — LACTIC ACID, PLASMA: Lactic Acid, Venous: 1 mmol/L (ref 0.5–1.9)

## 2020-04-14 LAB — URINALYSIS, ROUTINE W REFLEX MICROSCOPIC
Bacteria, UA: NONE SEEN
Bilirubin Urine: NEGATIVE
Glucose, UA: >=500 mg/dL — AB
Hgb urine dipstick: NEGATIVE
Ketones, ur: 80 mg/dL — AB
Leukocytes,Ua: NEGATIVE
Nitrite: NEGATIVE
Protein, ur: 30 mg/dL — AB
Specific Gravity, Urine: 1.015 (ref 1.005–1.030)
pH: 5 (ref 5.0–8.0)

## 2020-04-14 LAB — BASIC METABOLIC PANEL
Anion gap: 23 — ABNORMAL HIGH (ref 5–15)
BUN: 13 mg/dL (ref 8–23)
CO2: 15 mmol/L — ABNORMAL LOW (ref 22–32)
Calcium: 9.1 mg/dL (ref 8.9–10.3)
Chloride: 105 mmol/L (ref 98–111)
Creatinine, Ser: 1.79 mg/dL — ABNORMAL HIGH (ref 0.61–1.24)
GFR calc Af Amer: 43 mL/min — ABNORMAL LOW (ref >60)
GFR calc non Af Amer: 37 mL/min — ABNORMAL LOW (ref >60)
Glucose, Bld: 198 mg/dL — ABNORMAL HIGH (ref 70–99)
Potassium: 4.9 mmol/L (ref 3.5–5.1)
Sodium: 143 mmol/L (ref 135–145)

## 2020-04-14 LAB — BETA-HYDROXYBUTYRIC ACID: Beta-Hydroxybutyric Acid: >8 mmol/L — ABNORMAL HIGH (ref 0.05–0.27)

## 2020-04-14 LAB — SARS CORONAVIRUS 2 BY RT PCR (HOSPITAL ORDER, PERFORMED IN ~~LOC~~ HOSPITAL LAB): SARS Coronavirus 2: NEGATIVE

## 2020-04-14 LAB — CBG MONITORING, ED: Glucose-Capillary: 226 mg/dL — ABNORMAL HIGH (ref 70–99)

## 2020-04-14 LAB — PHOSPHORUS: Phosphorus: 4.9 mg/dL — ABNORMAL HIGH (ref 2.5–4.6)

## 2020-04-14 LAB — MAGNESIUM: Magnesium: 2.1 mg/dL (ref 1.7–2.4)

## 2020-04-14 MED ORDER — ACETAMINOPHEN 325 MG PO TABS: 650.0000 mg | ORAL_TABLET | Freq: Four times a day (QID) | ORAL | Status: DC | PRN

## 2020-04-14 MED ORDER — SODIUM CHLORIDE 0.9 % IV BOLUS
1000.0000 mL | Freq: Once | INTRAVENOUS | Status: AC
  Administered 2020-04-14: 1000 mL via INTRAVENOUS

## 2020-04-14 MED ORDER — INSULIN ASPART 100 UNIT/ML ~~LOC~~ SOLN: 0.0000 [IU] | Freq: Every day | SUBCUTANEOUS | Status: DC

## 2020-04-14 MED ORDER — INSULIN ASPART 100 UNIT/ML ~~LOC~~ SOLN
0.0000 [IU] | Freq: Three times a day (TID) | SUBCUTANEOUS | Status: DC
  Administered 2020-04-15: 2 [IU] via SUBCUTANEOUS
  Administered 2020-04-15: 3 [IU] via SUBCUTANEOUS
  Administered 2020-04-15: 5 [IU] via SUBCUTANEOUS

## 2020-04-14 MED ORDER — ONDANSETRON HCL 4 MG/2ML IJ SOLN
4.0000 mg | Freq: Four times a day (QID) | INTRAMUSCULAR | Status: DC | PRN
  Administered 2020-04-18: 4 mg via INTRAVENOUS
  Filled 2020-04-14: qty 2

## 2020-04-14 MED ORDER — SODIUM CHLORIDE 0.9 % IV SOLN: INTRAVENOUS | Status: DC

## 2020-04-14 MED ORDER — SODIUM CHLORIDE 0.9 % IV SOLN: 1000.0000 mL | INTRAVENOUS | Status: DC

## 2020-04-14 MED ORDER — ONDANSETRON HCL 4 MG PO TABS: 4.0000 mg | ORAL_TABLET | Freq: Four times a day (QID) | ORAL | Status: DC | PRN

## 2020-04-14 MED ORDER — SODIUM CHLORIDE 0.9% FLUSH
3.0000 mL | Freq: Once | INTRAVENOUS | Status: DC
Start: 2020-04-14 — End: 2020-04-19

## 2020-04-14 MED ORDER — ACETAMINOPHEN 650 MG RE SUPP: 650.0000 mg | Freq: Four times a day (QID) | RECTAL | Status: DC | PRN

## 2020-04-14 MED ORDER — SODIUM CHLORIDE 0.9 % IV BOLUS (SEPSIS)
500.0000 mL | Freq: Once | INTRAVENOUS | Status: AC
  Administered 2020-04-14: 500 mL via INTRAVENOUS

## 2020-04-14 MED ORDER — HEPARIN SODIUM (PORCINE) 5000 UNIT/ML IJ SOLN
5000.0000 [IU] | Freq: Three times a day (TID) | INTRAMUSCULAR | Status: DC
  Administered 2020-04-14 – 2020-04-19 (×14): 5000 [IU] via SUBCUTANEOUS
  Filled 2020-04-14 (×14): qty 1

## 2020-04-14 NOTE — ED Triage Notes (Signed)
Arrived via EMS from home. Developed general weakness 1 - 1.5 weeks ago worsening overtime with slurred speech and slipped out of a chair. Assisted back and denies injury. LKW unknown per EMS from family.

## 2020-04-14 NOTE — ED Provider Notes (Signed)
Clinical Course as of Apr 14 1804  Fri Apr 14, 2020  1636 Patient had an episode of wide-complex tachycardia 15 beats, rate was 158   [JK]    Clinical Course User Index [JK] Dorie Rank, MD   Patient was initially seen by Dr. Rogene Houston.  Please see his note.  Patient presents to the ED with progressive weakness.  Symptoms clearly be more lower extremity than upper extremity.  Patient also has a speech disturbance.  Patient's laboratory tests were notable for acute kidney injury with elevated creatinine.  Metabolic panel shows a creatinine of 2.2 although it was normal in the i-STAT.  I favor the metabolic panel to be more accurate.  IV fluids have been ordered.  No acute changes noted on the head CT.  Patient likely will require MRI. I have ordered IV fluids.  Will consult the medical service for admission and further treatment.    Dorie Rank, MD 04/14/20 1806

## 2020-04-14 NOTE — Plan of Care (Signed)

## 2020-04-14 NOTE — ED Provider Notes (Signed)
[facility] EMERGENCY DEPARTMENT Provider Note   CSN: [id number] Arrival date & time: 04/14/20  1351     History Chief Complaint  Patient presents with  . Weakness  . Aphasia    Jay Holloway is a 74 y.o. male.  Patient lives with family but is left alone at times.  Family states that normally he is able to walk but has had some generalized weakness for 1 to 1-1/2 weeks.  Getting worsening over time.  And now not able to walk.  Also slurred speech and slid out of a chair recently.  Family unknown about when the speech changes occurred.  Patient was seen August 24 for slipping out of bed.  Was unable to get back in.  It appears he does have a history of this.   Chart review shows that patient was admitted August 24 for very similar things where he kind of fell out of bed.  They restate that he had dysphagia then.  Had difficulty walking then.  Patient was discharged to a nursing facility.  But patient did not come from a nursing facility today.  Patient was brought in from home.  So based on this some of the patient's symptoms today could be long-term.  Past medical history significant for high cholesterol hypertension prostate cancer and diabetes.  Patient also had elevated blood sugars when he was admitted on the 24th.  And patient during that admission also was noted to have acute kidney injury.  In addition it appears that patient did not have a head CT done during that admission.  Or MRI.        Past Medical History:  Diagnosis Date  . Arthritis    trouble turning head side to side and up  . Diabetes mellitus without complication ([place])   . Hemorrhoids   . High cholesterol   . Hypertension   . Prostate cancer [facility]     Patient Active Problem List   Diagnosis Date Noted  . AKI (acute kidney injury) ([place]) 03/21/2020  . Prostate cancer ([place])   . Hypertension   . High cholesterol   . Sepsis ([place]) 11/08/2014  . Arterial hypotension   . Renal failure   .  Type 2 diabetes mellitus with ketoacidosis without coma ([place])   . Weakness   . Bacteremia   . Blood poisoning   . Urinary tract infectious disease   . DKA (diabetic ketoacidoses) ([place]) 11/05/2014    Past Surgical History:  Procedure Laterality Date  . CHOLECYSTECTOMY N/A 11/11/2014   Procedure: LAPAROSCOPIC CHOLECYSTECTOMY WITH INTRAOPERATIVE CHOLANGIOGRAM;  Surgeon: Erroll Luna, MD;  Location: [place];  Service: General;  Laterality: N/A;  . COLONOSCOPY N/A 11/09/2014   Procedure: COLONOSCOPY;  Surgeon: Juanita Craver, MD;  Location: [facility] ENDOSCOPY;  Service: Endoscopy;  Laterality: N/A;  . CYSTOSCOPY WITH RETROGRADE PYELOGRAM, URETEROSCOPY AND STENT PLACEMENT Left 04/18/2014   Procedure: CYSTOSCOPY WITH RETROGRADE PYELOGRAM, URETEROSCOPY AND STENT PLACEMENT;  Surgeon: Raynelle Bring, MD;  Location: [facility]S;  Service: Urology;  Laterality: Left;  . HOLMIUM LASER APPLICATION Left 7/37/1062   Procedure: HOLMIUM LASER APPLICATION;  Surgeon: Raynelle Bring, MD;  Location: [facility];  Service: Urology;  Laterality: Left;  . LYMPHADENECTOMY  06/04/2012   Procedure: LYMPHADENECTOMY;  Surgeon: Dutch Gray, MD;  Location: [facility];  Service: Urology;  Laterality: Bilateral;  . right  achilles tendon repair  yrs ago  . ROBOT ASSISTED LAPAROSCOPIC RADICAL PROSTATECTOMY  06/04/2012   Procedure: ROBOTIC ASSISTED LAPAROSCOPIC RADICAL PROSTATECTOMY LEVEL 3;  Surgeon: Dutch Gray, MD;  Location: [facility];  Service: Urology;  Laterality: N/A;       No family history on file.  Social History   Tobacco Use  . Smoking status: Former Smoker    Packs/day: 0.25    Years: 4.00    Pack years: 1.00    Types: Cigarettes    Quit date: 12/16/1978    Years since quitting: 41.3  . Smokeless tobacco: Never Used  Substance Use Topics  . Alcohol use: Not Currently    Comment: remote h/o heavy use  . Drug use: Not Currently    Types: Marijuana    Comment: 1970s    Home Medications Prior to Admission medications     Medication Sig Start Date End Date Taking? Authorizing Provider  amLODipine (NORVASC) 10 MG tablet Take 10 mg by mouth daily.    [provider]  atorvastatin (LIPITOR) 10 MG tablet Take 10 mg by mouth daily.  04/21/18   [provider]  Cholecalciferol (VITAMIN D3) 25 MCG (1000 UT) CAPS Take 1 capsule by mouth daily.  10/15/18   [provider]  furosemide (LASIX) 40 MG tablet Take 0.5 tablets (20 mg total) by mouth daily. fluid 03/24/20   Swayze, Ava, DO  HYDROcodone-acetaminophen (NORCO) 7.5-325 MG tablet Take 1 tablet by mouth 3 (three) times daily as needed for moderate pain or severe pain. 03/24/20   Swayze, Ava, DO  insulin glargine (LANTUS) 100 UNIT/ML injection Inject 0.1 mLs (10 Units total) into the skin daily. Give only 5 units for am glucose less than 90. 03/25/20   Swayze, Ava, DO  melatonin 3 MG TABS tablet Take 1 tablet (3 mg total) by mouth at bedtime. 03/24/20   Swayze, Ava, DO  metFORMIN (GLUCOPHAGE-XR) 500 MG 24 hr tablet Take 500 mg by mouth in the morning and at bedtime.  10/15/18   [provider]  Multiple Vitamins-Minerals (MULTIVITAMIN ADULTS) TABS Take 1 tablet by mouth daily.    [provider]  pantoprazole (PROTONIX) 40 MG tablet Take 1 tablet (40 mg total) by mouth 2 (two) times daily. 03/24/20   Swayze, Ava, DO  potassium chloride 20 MEQ TBCR Take 20 mEq by mouth daily. 03/24/20 04/23/20  Swayze, Ava, DO  Semaglutide (OZEMPIC, 1 MG/DOSE, [place]) Inject 1 mg into the skin once a week. Wed    [provider]    Allergies    Patient has no known allergies.  Review of Systems   Review of Systems  Unable to perform ROS: Mental status change    Physical Exam Updated Vital Signs BP 125/80   Pulse (!) 108   Temp 97.8 F (36.6 C) (Oral)   Resp 16   Ht 1.829 m (6')   Wt 86.2 kg   SpO2 98%   BMI 25.77 kg/m   Physical Exam Vitals and nursing note reviewed.  Constitutional:      Appearance: Normal appearance. He is  well-developed.  HENT:     Head: Normocephalic and atraumatic.  Eyes:     Extraocular Movements: Extraocular movements intact.     Conjunctiva/sclera: Conjunctivae normal.     Pupils: Pupils are equal, round, and reactive to light.  Cardiovascular:     Rate and Rhythm: Normal rate and regular rhythm.     Heart sounds: No murmur heard.   Pulmonary:     Effort: Pulmonary effort is normal. No respiratory distress.     Breath sounds: Normal breath sounds.  Abdominal:  Palpations: Abdomen is soft.     Tenderness: There is no abdominal tenderness.  Musculoskeletal:        General: Swelling present. Normal range of motion.     Cervical back: Normal range of motion and neck supple.     Right lower leg: Edema present.     Left lower leg: Edema present.  Skin:    General: Skin is warm and dry.  Neurological:     Mental Status: He is alert.     Motor: Weakness present.     Comments: Patient able to move both lower extremities but minimally. Certainly cannot hold them up against gravity. They just drop. But it is equal bilaterally. Good upper extremity strength. Patient is speaking but it is very slurred and difficult to understand but he clearly follows commands.     ED Results / Procedures / Treatments   Labs (all labs ordered are listed, but only abnormal results are displayed) Labs Reviewed  CBC - Abnormal; Notable for the following components:      Result Value   Hemoglobin 12.4 (*)    MCHC 28.9 (*)    All other components within normal limits  I-STAT CHEM 8, ED - Abnormal; Notable for the following components:   Glucose, Bld 231 (*)    TCO2 19 (*)    All other components within normal limits  CBG MONITORING, ED - Abnormal; Notable for the following components:   Glucose-Capillary 226 (*)    All other components within normal limits  SARS CORONAVIRUS 2 BY RT PCR (HOSPITAL ORDER, [place] [facility])  DIFFERENTIAL  COMPREHENSIVE METABOLIC PANEL    URINALYSIS, ROUTINE W REFLEX MICROSCOPIC    EKG None  Radiology DG Chest Port 1 View  Result Date: 04/14/2020 CLINICAL DATA:  Weakness. EXAM: PORTABLE CHEST 1 VIEW COMPARISON:  03/21/2020 FINDINGS: Cardiac silhouette is normal in size. No mediastinal or hilar masses. Linear opacities noted at the right lung base consistent with scarring or atelectasis, stable. Lungs otherwise clear. No pleural effusion or pneumothorax. Skeletal structures are grossly intact. IMPRESSION: No active disease. Electronically Signed   By: Lajean Manes M.D.   On: 04/14/2020 15:01    Procedures Procedures (including critical care time)  Medications Ordered in ED Medications  sodium chloride flush (NS) 0.9 % injection 3 mL (has no administration in time range)    ED Course  I have reviewed the triage vital signs and the nursing notes.  Pertinent labs & imaging results that were available during my care of the patient were reviewed by me and considered in my medical decision making (see chart for details).    MDM Rules/Calculators/A&P                          Patient here initial labs showed no evidence of kidney function. We'll do head CT. Patient prior admission talked about dysphagia patient did not have head CT or MRI. Not clear whether patient needs MRI again today. We'll also get urinalysis. CBC without any acute findings. Covid testing is still pending. Chest x-ray without any acute findings. EKG showed a bifascicular block. Blood sugars are a little elevated 231. CO2 is at 19 complete metabolic panel still pending.    Patient turned over to Marye Round evening emergency physician will follow up CT scan urinalysis.  Also have instructed the nurse to recontact family members to see if patient ever went to a nursing facility or not.  Final Clinical Impression(s) / ED Diagnoses Final diagnoses:  Slurred speech  Weakness    Rx / DC Orders ED Discharge Orders    None       Fredia Sorrow, MD 04/14/20 1655

## 2020-04-14 NOTE — H&P (Signed)
History and Physical    Jay Holloway STM:[id number] DOB: 08/26/1945 DOA: 04/14/2020  PCP: [name], MD  Patient coming from: home I have personally briefly reviewed patient's old medical records in [place]  Chief Complaint: " I feel weak and not feeling well overall"  HPI: Jay Holloway is a 74 y.o. male with medical history significant of hypertension, hyperlipidemia, diabetes mellitus, prostate cancer status post radical prostatectomy in 2013, GERD, CKD stage III presents to emergency department with generalized weakness.  Patient tells me that he feels weak and not feeling well overall since 1-1/2 weeks.  Reports that he slipped out of chair however denies any injuries or head trauma.  Reports that his generalized weakness is getting worse and now that he cannot able to walk.  Patient denies any change in speech (he tells me that his speech is always like that), headache, blurry vision, lightheadedness, dizziness, numbness, weakness, tingling sensation in hands or feet, facial drop, nausea, vomiting, diarrhea, abdominal pain, chest pain, shortness of breath, palpitation, urinary or bowel changes.  He admitted on 8/24 with fall in DKA and discharged to SNF on 8/27.  He denies history of smoking, alcohol, street drug use.  He has chronic bilateral lower extremity edema however denies worsening swelling, redness, orthopnea or PND.  ED Course: Upon arrival to ED: Patient's tachycardic, afebrile with no leukocytosis, CMP shows potassium of 5.3, worsening kidney function, anion gap of 24, bicarb: 19, blood sugar 239, UA negative for infection, PT/INR, APTT: WNL, COVID-19, magnesium: Pending, CT head and chest x-ray negative for acute findings.  Patient was given IV fluids in ED.  MRI brain is ordered and is pending.  [facility] hospitalist consulted for admission for generalized weakness, questionable new onset slurred speech  Review of Systems: As per HPI otherwise negative.    Past  Medical History:  Diagnosis Date  . Arthritis    trouble turning head side to side and up  . Diabetes mellitus without complication ([place])   . Hemorrhoids   . High cholesterol   . Hypertension   . Prostate cancer [facility])     Past Surgical History:  Procedure Laterality Date  . CHOLECYSTECTOMY N/A 11/11/2014   Procedure: LAPAROSCOPIC CHOLECYSTECTOMY WITH INTRAOPERATIVE CHOLANGIOGRAM;  Surgeon: Erroll Luna, MD;  Location: [place];  Service: General;  Laterality: N/A;  . COLONOSCOPY N/A 11/09/2014   Procedure: COLONOSCOPY;  Surgeon: Juanita Craver, MD;  Location: [facility] ENDOSCOPY;  Service: Endoscopy;  Laterality: N/A;  . CYSTOSCOPY WITH RETROGRADE PYELOGRAM, URETEROSCOPY AND STENT PLACEMENT Left 04/18/2014   Procedure: CYSTOSCOPY WITH RETROGRADE PYELOGRAM, URETEROSCOPY AND STENT PLACEMENT;  Surgeon: Raynelle Bring, MD;  Location: [facility];  Service: Urology;  Laterality: Left;  . HOLMIUM LASER APPLICATION Left 8/92/1194   Procedure: HOLMIUM LASER APPLICATION;  Surgeon: Raynelle Bring, MD;  Location: [facility];  Service: Urology;  Laterality: Left;  . LYMPHADENECTOMY  06/04/2012   Procedure: LYMPHADENECTOMY;  Surgeon: Dutch Gray, MD;  Location: [facility];  Service: Urology;  Laterality: Bilateral;  . right  achilles tendon repair  yrs ago  . ROBOT ASSISTED LAPAROSCOPIC RADICAL PROSTATECTOMY  06/04/2012   Procedure: ROBOTIC ASSISTED LAPAROSCOPIC RADICAL PROSTATECTOMY LEVEL 3;  Surgeon: Dutch Gray, MD;  Location: [facility];  Service: Urology;  Laterality: N/A;     reports that he quit smoking about 41 years ago. His smoking use included cigarettes. He has a 1.00 pack-year smoking history. He has never used smokeless tobacco. He reports previous alcohol use. He reports previous drug use. Drug: Marijuana.  No Known Allergies  No family history on file.  Prior to Admission medications   Medication Sig Start Date End Date Taking? Authorizing Provider  amLODipine (NORVASC) 10 MG tablet Take 10 mg by mouth daily.     [provider]  atorvastatin (LIPITOR) 10 MG tablet Take 10 mg by mouth daily.  04/21/18   [provider]  Cholecalciferol (VITAMIN D3) 25 MCG (1000 UT) CAPS Take 1 capsule by mouth daily.  10/15/18   [provider]  furosemide (LASIX) 40 MG tablet Take 0.5 tablets (20 mg total) by mouth daily. fluid 03/24/20   Swayze, Ava, DO  HYDROcodone-acetaminophen (NORCO) 7.5-325 MG tablet Take 1 tablet by mouth 3 (three) times daily as needed for moderate pain or severe pain. 03/24/20   Swayze, Ava, DO  insulin glargine (LANTUS) 100 UNIT/ML injection Inject 0.1 mLs (10 Units total) into the skin daily. Give only 5 units for am glucose less than 90. 03/25/20   Swayze, Ava, DO  melatonin 3 MG TABS tablet Take 1 tablet (3 mg total) by mouth at bedtime. 03/24/20   Swayze, Ava, DO  metFORMIN (GLUCOPHAGE-XR) 500 MG 24 hr tablet Take 500 mg by mouth in the morning and at bedtime.  10/15/18   [provider]  Multiple Vitamins-Minerals (MULTIVITAMIN ADULTS) TABS Take 1 tablet by mouth daily.    [provider]  pantoprazole (PROTONIX) 40 MG tablet Take 1 tablet (40 mg total) by mouth 2 (two) times daily. 03/24/20   Swayze, Ava, DO  potassium chloride 20 MEQ TBCR Take 20 mEq by mouth daily. 03/24/20 04/23/20  Swayze, Ava, DO  Semaglutide (OZEMPIC, 1 MG/DOSE, [place]) Inject 1 mg into the skin once a week. Wed    [provider]    Physical Exam: Vitals:   04/14/20 1409 04/14/20 1410 04/14/20 1415 04/14/20 1700  BP: 128/77  125/80 124/75  Pulse: (!) 107  (!) 108 (!) 105  Resp: 16  16 15   Temp: 97.8 F (36.6 C)     TempSrc: Oral     SpO2: 100%  98% 99%  Weight:  86.2 kg    Height:  6' (1.829 m)      Constitutional: NAD, calm, comfortable, on RA, appears dehydrated, cachectic Eyes: PERRL, lids and conjunctivae normal ENMT: Mucous membranes are dry. Posterior pharynx clear of any exudate or lesions.Normal dentition.  Neck: normal, supple, no masses, no  thyromegaly Respiratory: clear to auscultation bilaterally, no wheezing, no crackles. Normal respiratory effort. No accessory muscle use.  Cardiovascular: Regular rate and rhythm, no murmurs / rubs / gallops.  Bilateral lower extremity 2+ edema positive. 2+ pedal pulses. No carotid bruits.  Abdomen: no tenderness, no masses palpated. No hepatosplenomegaly. Bowel sounds positive.  Musculoskeletal: no clubbing / cyanosis. No joint deformity upper and lower extremities. Good ROM, no contractures. Normal muscle tone.  Skin: no rashes, lesions, ulcers. No induration Neurologic: CN 2-12 grossly intact. Sensation intact, DTR normal.  Power 4 out of 5 in bilateral upper extremities and 3 out of 5 in bilateral lower extremities.   Psychiatric: Normal judgment and insight. Alert and oriented x 3. Normal mood.    Labs on Admission: I have personally reviewed following labs and imaging studies  CBC: Recent Labs  Lab 04/14/20 1600 04/14/20 1617  WBC 9.7  --   NEUTROABS 6.4  --   HGB 12.4* 14.6  HCT 42.9 43.0  MCV 97.1  --   PLT 282  --    Basic Metabolic Panel: Recent Labs  Lab 04/14/20 1600 04/14/20 1617  NA 143 141  K 5.3* 4.9  CL 102 104  CO2 17*  --   GLUCOSE 239* 231*  BUN 12 19  CREATININE 2.12* 0.90  CALCIUM 9.3  --    GFR: Estimated Creatinine Clearance: 80.2 mL/min (by C-G formula based on SCr of 0.9 mg/dL). Liver Function Tests: Recent Labs  Lab 04/14/20 1600  AST 20  ALT 21  ALKPHOS 75  BILITOT 2.0*  PROT 6.7  ALBUMIN 3.7   No results for input(s): LIPASE, AMYLASE in the last 168 hours. No results for input(s): AMMONIA in the last 168 hours. Coagulation Profile: Recent Labs  Lab 04/14/20 1649  INR 1.0   Cardiac Enzymes: No results for input(s): CKTOTAL, CKMB, CKMBINDEX, TROPONINI in the last 168 hours. BNP (last 3 results) No results for input(s): PROBNP in the last 8760 hours. HbA1C: No results for input(s): HGBA1C in the last 72 hours. CBG: Recent  Labs  Lab 04/14/20 1426  GLUCAP 226*   Lipid Profile: No results for input(s): CHOL, HDL, LDLCALC, TRIG, CHOLHDL, LDLDIRECT in the last 72 hours. Thyroid Function Tests: No results for input(s): TSH, T4TOTAL, FREET4, T3FREE, THYROIDAB in the last 72 hours. Anemia Panel: No results for input(s): VITAMINB12, FOLATE, FERRITIN, TIBC, IRON, RETICCTPCT in the last 72 hours. Urine analysis:    Component Value Date/Time   COLORURINE YELLOW 04/14/2020 [place] 04/14/2020 1743   LABSPEC 1.015 04/14/2020 1743   PHURINE 5.0 04/14/2020 1743   GLUCOSEU >=500 (A) 04/14/2020 1743   HGBUR NEGATIVE 04/14/2020 1743   BILIRUBINUR NEGATIVE 04/14/2020 1743   KETONESUR 80 (A) 04/14/2020 1743   PROTEINUR 30 (A) 04/14/2020 1743   UROBILINOGEN 0.2 11/05/2014 1712   NITRITE NEGATIVE 04/14/2020 1743   LEUKOCYTESUR NEGATIVE 04/14/2020 1743    Radiological Exams on Admission: CT HEAD WO CONTRAST  Result Date: 04/14/2020 CLINICAL DATA:  Altered mental status. Generalized weakness for 1.5 weeks. Slurred speech. Slipped from chair. EXAM: CT HEAD WITHOUT CONTRAST TECHNIQUE: Contiguous axial images were obtained from the base of the skull through the vertex without intravenous contrast. COMPARISON:  None. FINDINGS: Brain: No evidence of parenchymal hemorrhage or extra-axial fluid collection. No mass lesion, mass effect, or midline shift. No CT evidence of acute infarction. Nonspecific mild subcortical and periventricular white matter hypodensity, most in keeping with chronic small vessel ischemic change. Cerebral volume is age appropriate. No ventriculomegaly. Vascular: No acute abnormality. Skull: No evidence of calvarial fracture. Sinuses/Orbits: The visualized paranasal sinuses are essentially clear. Other:  The mastoid air cells are unopacified. IMPRESSION: 1. No evidence of acute intracranial abnormality. No evidence of calvarial fracture. 2. Mild chronic small vessel ischemic changes in the  cerebral white matter. Electronically Signed   By: Ilona Sorrel M.D.   On: 04/14/2020 17:56   DG Chest Port 1 View  Result Date: 04/14/2020 CLINICAL DATA:  Weakness. EXAM: PORTABLE CHEST 1 VIEW COMPARISON:  03/21/2020 FINDINGS: Cardiac silhouette is normal in size. No mediastinal or hilar masses. Linear opacities noted at the right lung base consistent with scarring or atelectasis, stable. Lungs otherwise clear. No pleural effusion or pneumothorax. Skeletal structures are grossly intact. IMPRESSION: No active disease. Electronically Signed   By: Lajean Manes M.D.   On: 04/14/2020 15:01    EKG: Independently reviewed.  Sinus tachycardia, right bundle branch block LAFB. Assessment/Plan Principal Problem:   Weakness Active Problems:   DM (diabetes mellitus) (HCC)   Prostate cancer (HCC)   Hypertension   HLD (hyperlipidemia)  Acute on chronic kidney failure (HCC)   Hyperkalemia   GERD (gastroesophageal reflux disease)   Generalized weakness    Generalized weakness: -Likely secondary to dehydration. -Continue IV fluids.  CT head is negative for acute findings.  COVID-19 is pending.  Chest x-ray is negative for pneumonia.  Patient is afebrile with no leukocytosis -Continue IV fluids.  Monitor vitals closely. -As per EDP: New onset of slurred speech however patient denies any changes in his speech.  MRI is ordered to rule out stroke and is pending. -Consult PT/OT. -On fall precautions  AKI on CKD stage III: -Creatinine: 2.12, GFR: 35 -Likely secondary to dehydration.  Continue IV fluids.  Hold nephrotoxic medication.   Anion gap metabolic acidosis: -Patient's blood sugar upon arrival 239, bicarb: 19, anion gap of 24-he does not meet DKA criteria. -Likely secondary to worsening kidney function and dehydration.  Will check lactic acid and beta hydroxybutyric acid.  Continue IV fluids.  Monitor kidney function closely. -We will hold off Endo tool for now.  Type 2 diabetes mellitus:  Well controlled.  Last A1c 6.2%. -Hold Metformin and Lantus and Ozempic for now. -Start patient on sliding scale insulin.  Repeat BMP.  Continue IV fluids.  Monitor blood sugar closely.  Hyperkalemia: Potassium 5.3 -Likely secondary to worsening kidney function -No EKG changes.  Continue IV fluids -Repeat BMP.  Hyperlipidemia: Continue statin  Hypertension: Stable -Hold home BP meds amlodipine for now.  Monitor blood pressure closely.  GERD: Continue PPI  History of prostate cancer: Status post radical prostatectomy in 2013.  DVT prophylaxis: Heparin/SCD Code Status: DNR-confirmed with the patient Family Communication:None present at bedside.  Plan of care discussed with patient in length and he verbalized understanding and agreed with it. Disposition Plan: Likely SNF in 1 to 2 days Consults called: None Admission status: Inpatient   Mckinley Jewel MD [facility]  If 7PM-7AM, please contact night-coverage [email or web address] Password [facility]  04/14/2020, 6:39 PM

## 2020-04-14 NOTE — Progress Notes (Signed)
Patient arrived in the unit at 2015 pm from [facility] A&O x2 , period of confusion, no s/s distress, initiated telemetry monitor, and will continue to monitor.

## 2020-04-15 DIAGNOSIS — N179 Acute kidney failure, unspecified: Secondary | ICD-10-CM

## 2020-04-15 DIAGNOSIS — E872 Acidosis: Secondary | ICD-10-CM

## 2020-04-15 DIAGNOSIS — N189 Chronic kidney disease, unspecified: Secondary | ICD-10-CM

## 2020-04-15 DIAGNOSIS — I10 Essential (primary) hypertension: Secondary | ICD-10-CM

## 2020-04-15 DIAGNOSIS — E785 Hyperlipidemia, unspecified: Secondary | ICD-10-CM

## 2020-04-15 LAB — BASIC METABOLIC PANEL
Anion gap: 19 — ABNORMAL HIGH (ref 5–15)
Anion gap: 21 — ABNORMAL HIGH (ref 5–15)
Anion gap: 16 — ABNORMAL HIGH (ref 5–15)
BUN: 14 mg/dL (ref 8–23)
BUN: 14 mg/dL (ref 8–23)
BUN: 13 mg/dL (ref 8–23)
CO2: 15 mmol/L — ABNORMAL LOW (ref 22–32)
CO2: 13 mmol/L — ABNORMAL LOW (ref 22–32)
CO2: 20 mmol/L — ABNORMAL LOW (ref 22–32)
Calcium: 9.1 mg/dL (ref 8.9–10.3)
Calcium: 8.9 mg/dL (ref 8.9–10.3)
Calcium: 8.9 mg/dL (ref 8.9–10.3)
Chloride: 112 mmol/L — ABNORMAL HIGH (ref 98–111)
Chloride: 112 mmol/L — ABNORMAL HIGH (ref 98–111)
Chloride: 114 mmol/L — ABNORMAL HIGH (ref 98–111)
Creatinine, Ser: 1.72 mg/dL — ABNORMAL HIGH (ref 0.61–1.24)
Creatinine, Ser: 1.75 mg/dL — ABNORMAL HIGH (ref 0.61–1.24)
Creatinine, Ser: 1.74 mg/dL — ABNORMAL HIGH (ref 0.61–1.24)
GFR calc Af Amer: 45 mL/min — ABNORMAL LOW (ref >60)
GFR calc Af Amer: 44 mL/min — ABNORMAL LOW (ref >60)
GFR calc Af Amer: 44 mL/min — ABNORMAL LOW (ref >60)
GFR calc non Af Amer: 39 mL/min — ABNORMAL LOW (ref >60)
GFR calc non Af Amer: 38 mL/min — ABNORMAL LOW (ref >60)
GFR calc non Af Amer: 38 mL/min — ABNORMAL LOW (ref >60)
Glucose, Bld: 171 mg/dL — ABNORMAL HIGH (ref 70–99)
Glucose, Bld: 148 mg/dL — ABNORMAL HIGH (ref 70–99)
Glucose, Bld: 153 mg/dL — ABNORMAL HIGH (ref 70–99)
Potassium: 4.4 mmol/L (ref 3.5–5.1)
Potassium: 4.5 mmol/L (ref 3.5–5.1)
Potassium: 4.3 mmol/L (ref 3.5–5.1)
Sodium: 146 mmol/L — ABNORMAL HIGH (ref 135–145)
Sodium: 146 mmol/L — ABNORMAL HIGH (ref 135–145)
Sodium: 150 mmol/L — ABNORMAL HIGH (ref 135–145)

## 2020-04-15 LAB — COMPREHENSIVE METABOLIC PANEL
ALT: 20 U/L (ref 0–44)
AST: 12 U/L — ABNORMAL LOW (ref 15–41)
Albumin: 3.4 g/dL — ABNORMAL LOW (ref 3.5–5.0)
Alkaline Phosphatase: 66 U/L (ref 38–126)
Anion gap: 24 — ABNORMAL HIGH (ref 5–15)
BUN: 11 mg/dL (ref 8–23)
CO2: 14 mmol/L — ABNORMAL LOW (ref 22–32)
Calcium: 9 mg/dL (ref 8.9–10.3)
Chloride: 106 mmol/L (ref 98–111)
Creatinine, Ser: 1.75 mg/dL — ABNORMAL HIGH (ref 0.61–1.24)
GFR calc Af Amer: 44 mL/min — ABNORMAL LOW (ref >60)
GFR calc non Af Amer: 38 mL/min — ABNORMAL LOW (ref >60)
Glucose, Bld: 190 mg/dL — ABNORMAL HIGH (ref 70–99)
Potassium: 4.8 mmol/L (ref 3.5–5.1)
Sodium: 144 mmol/L (ref 135–145)
Total Bilirubin: 1.8 mg/dL — ABNORMAL HIGH (ref 0.3–1.2)
Total Protein: 6.1 g/dL — ABNORMAL LOW (ref 6.5–8.1)

## 2020-04-15 LAB — CBC
HCT: 43 % (ref 39.0–52.0)
Hemoglobin: 12.6 g/dL — ABNORMAL LOW (ref 13.0–17.0)
MCH: 28.1 pg (ref 26.0–34.0)
MCHC: 29.3 g/dL — ABNORMAL LOW (ref 30.0–36.0)
MCV: 95.8 fL (ref 80.0–100.0)
Platelets: 224 10*3/uL (ref 150–400)
RBC: 4.49 MIL/uL (ref 4.22–5.81)
RDW: 14.5 % (ref 11.5–15.5)
WBC: 10 10*3/uL (ref 4.0–10.5)
nRBC: 0 % (ref 0.0–0.2)

## 2020-04-15 LAB — TSH: TSH: 0.668 u[IU]/mL (ref 0.350–4.500)

## 2020-04-15 LAB — GLUCOSE, CAPILLARY
Glucose-Capillary: 208 mg/dL — ABNORMAL HIGH (ref 70–99)
Glucose-Capillary: 165 mg/dL — ABNORMAL HIGH (ref 70–99)
Glucose-Capillary: 177 mg/dL — ABNORMAL HIGH (ref 70–99)
Glucose-Capillary: 138 mg/dL — ABNORMAL HIGH (ref 70–99)
Glucose-Capillary: 126 mg/dL — ABNORMAL HIGH (ref 70–99)
Glucose-Capillary: 142 mg/dL — ABNORMAL HIGH (ref 70–99)
Glucose-Capillary: 172 mg/dL — ABNORMAL HIGH (ref 70–99)

## 2020-04-15 LAB — BETA-HYDROXYBUTYRIC ACID: Beta-Hydroxybutyric Acid: 6.97 mmol/L — ABNORMAL HIGH (ref 0.05–0.27)

## 2020-04-15 MED ORDER — INSULIN ASPART 100 UNIT/ML ~~LOC~~ SOLN: 0.0000 [IU] | Freq: Three times a day (TID) | SUBCUTANEOUS | Status: DC

## 2020-04-15 MED ORDER — POTASSIUM CHLORIDE 10 MEQ/100ML IV SOLN
10.0000 meq | INTRAVENOUS | Status: AC
  Administered 2020-04-15 (×2): 10 meq via INTRAVENOUS
  Filled 2020-04-15 (×2): qty 100

## 2020-04-15 MED ORDER — DEXTROSE IN LACTATED RINGERS 5 % IV SOLN: INTRAVENOUS | Status: DC

## 2020-04-15 MED ORDER — LACTATED RINGERS IV SOLN: INTRAVENOUS | Status: DC

## 2020-04-15 MED ORDER — LACTATED RINGERS IV BOLUS: 20.0000 mL/kg | Freq: Once | INTRAVENOUS | Status: DC

## 2020-04-15 MED ORDER — LACTATED RINGERS IV BOLUS
1000.0000 mL | Freq: Once | INTRAVENOUS | Status: AC
  Administered 2020-04-15: 1000 mL via INTRAVENOUS

## 2020-04-15 MED ORDER — INSULIN REGULAR(HUMAN) IN NACL 100-0.9 UT/100ML-% IV SOLN
INTRAVENOUS | Status: DC
  Administered 2020-04-15: 4.4 [IU]/h via INTRAVENOUS
  Filled 2020-04-15: qty 100

## 2020-04-15 MED ORDER — DEXTROSE 50 % IV SOLN: 0.0000 mL | INTRAVENOUS | Status: DC | PRN

## 2020-04-15 MED ORDER — INSULIN ASPART 100 UNIT/ML ~~LOC~~ SOLN
0.0000 [IU] | SUBCUTANEOUS | Status: DC
  Administered 2020-04-15: 1 [IU] via SUBCUTANEOUS

## 2020-04-15 MED ORDER — SODIUM BICARBONATE 8.4 % IV SOLN
50.0000 meq | Freq: Once | INTRAVENOUS | Status: AC
  Administered 2020-04-15: 50 meq via INTRAVENOUS
  Filled 2020-04-15: qty 50

## 2020-04-15 NOTE — Progress Notes (Signed)
Spoke [place] pertaining to pt's fluid and insulin gtt orders. Per Marlowe Sax, ok to hold off on insulin gtt and D5LR infusion until lab result returns. aslo updated about pt's HR being in teens then returning to low 100s. Mews green d/t HR returning to norm.

## 2020-04-15 NOTE — Evaluation (Signed)
Occupational Therapy Evaluation Patient Details Name: Jay Holloway MRN: [id number] DOB: 09-Apr-1946 Today's Date: 04/15/2020    History of Present Illness 74 y.o. male with medical history significant of hypertension, hyperlipidemia, diabetes mellitus, prostate cancer status post radical prostatectomy in 2013, GERD, CKD stage III presents to emergency department with generalized weakness. Reports that he slipped out of chair however denies any injuries or head trauma (of note, pt admitted late August 2021 for same). HEad CT and MRI brain negative; likely dehydration.   Clinical Impression   PTA, pt reports he lives alone and performed all ADLs and IADLs; unsure of reliability as pt with decreased cognition and poor historian. Pt requiring Max A for ADLs, Mod A +2 for bed mobility, and declined functional transfers. Pt performing oral care and self feeding tasks demonstrating poor sequencing requiring Max cues throughout. Pt would benefit from further acute OT to facilitate safe dc. Recommend dc to SNF for further OT to optimize safety, independence with ADLs, and return to PLOF.     Follow Up Recommendations  SNF    Equipment Recommendations  Other (comment) (Defer to next venue)    Recommendations for Other Services PT consult     Precautions / Restrictions Precautions Precautions: Fall Precaution Comments: fall from chair at home      Mobility Bed Mobility Overal bed mobility: Needs Assistance Bed Mobility: Rolling;Sidelying to Sit;Sit to Supine Rolling: Mod assist Sidelying to sit: Mod assist;HOB elevated (with rail)   Sit to supine: Mod assist;+2 for physical assistance;+2 for safety/equipment   General bed mobility comments: poor initiation for coming to sit EOB; as therapist began to move/facilitate movement, he joined in   Transfers                 General transfer comment: attempted from elevated bed due to pt's height with pt resisting    Balance Overall  balance assessment: Needs assistance Sitting-balance support: Single extremity supported;Feet supported Sitting balance-Leahy Scale: Poor Sitting balance - Comments: posterior bias; imbalance when attending to brushing teeth required min assist                                   ADL either performed or assessed with clinical judgement   ADL Overall ADL's : Needs assistance/impaired Eating/Feeding: Minimal assistance;Sitting Eating/Feeding Details (indicate cue type and reason): Pt bringing empty hand to his face. When asked why he brought his hand to mouth, pt stating he thought he had a cup in his hand. Provdiing pt with cup and he switched back and forth between hands before bringing RUE with cup to mouth. Grooming: Minimal assistance;Cueing for sequencing;Sitting;Oral care Grooming Details (indicate cue type and reason): Pt requiring Max cues to initate each step of oral care. Pt perseverating and becoming confused quickly.                                General ADL Comments: Pt requiring Max A for ADLs and mobility. Pt presenting with poor cognition, strength, and balance     Vision         Perception     Praxis      Pertinent Vitals/Pain Pain Assessment: Faces Faces Pain Scale: Hurts even more Pain Location: LLE, foot Pain Descriptors / Indicators: Grimacing Pain Intervention(s): Limited activity within patient's tolerance;Monitored during session;Repositioned     Hand Dominance Right  Extremity/Trunk Assessment Upper Extremity Assessment Upper Extremity Assessment: Generalized weakness   Lower Extremity Assessment Lower Extremity Assessment: Defer to PT evaluation   Cervical / Trunk Assessment Cervical / Trunk Assessment: Kyphotic   Communication Communication Communication: Other (comment) (parrots/repeats what is said to him)   Cognition Arousal/Alertness: Lethargic Behavior During Therapy: Flat affect Overall Cognitive Status: No  family/caregiver present to determine baseline cognitive functioning Area of Impairment: Following commands;Attention;Memory;Problem solving;Awareness                   Current Attention Level: Sustained Memory: Decreased short-term memory Following Commands: Follows one step commands inconsistently;Follows one step commands with increased time   Awareness: Intellectual Problem Solving: Slow processing;Requires verbal cues;Requires tactile cues;Difficulty sequencing;Decreased initiation General Comments: Pt presenting with poor initating of steps within a task as well as perseveration on certain motions and phrases. Pt repeated attempting to take off tooth paste cap after taking it off, switching tooth brush between hands, and stating "I don't want to spit". Pt requiring Max cues for sequencing of self feeding/drink and grooming task.    General Comments  HR 115-124 bpm; BP 124/76 sitting; sats 100% RA    Exercises     Shoulder Instructions      Home Living Family/patient expects to be discharged to:: Private residence Living Arrangements: Alone Available Help at Discharge: Friend(s);Available PRN/intermittently Type of Home: House Home Access: Stairs to enter [employer] of Steps: 3 Entrance Stairs-Rails: Left Home Layout: One level     Bathroom Shower/Tub: Occupational psychologist: Handicapped height     Home Equipment: Environmental consultant - 2 wheels;Cane - single point;Grab bars - tub/shower   Additional Comments: All info from prior record; pt unable to state      Prior Functioning/Environment Level of Independence: Needs assistance  Gait / Transfers Assistance Needed: Uses RW vs cane depending on the day. Reports that he sometimes needs help, then reports he helps himself.  ADL's / Homemaking Assistance Needed: Reports independence with ADLs   Comments: All information from Aug 2021 admission; pt unable to report        OT Problem List: Decreased  strength;Decreased range of motion;Decreased activity tolerance;Impaired balance (sitting and/or standing);Decreased knowledge of use of DME or AE;Decreased cognition;Decreased knowledge of precautions;Pain      OT Treatment/Interventions: Self-care/ADL training;Therapeutic exercise;Energy conservation;DME and/or AE instruction;Therapeutic activities;Patient/family education    OT Goals(Current goals can be found in the care plan section) Acute Rehab OT Goals Patient Stated Goal: patient agrees wants to get stronger OT Goal Formulation: With patient Time For Goal Achievement: 04/29/20 Potential to Achieve Goals: Good  OT Frequency: Min 1X/week   Barriers to D/C:            Co-evaluation PT/OT/SLP Co-Evaluation/Treatment: Yes Reason for Co-Treatment: For patient/therapist safety;To address functional/ADL transfers;Necessary to address cognition/behavior during functional activity PT goals addressed during session: Mobility/safety with mobility;Balance;Strengthening/ROM OT goals addressed during session: ADL's and self-care      AM-PAC OT "6 Clicks" Daily Activity     Outcome Measure Help from another person eating meals?: A Little Help from another person taking care of personal grooming?: A Lot Help from another person toileting, which includes using toliet, bedpan, or urinal?: A Lot Help from another person bathing (including washing, rinsing, drying)?: A Lot Help from another person to put on and taking off regular upper body clothing?: A Lot Help from another person to put on and taking off regular lower body clothing?: A Lot 6 Click  Score: 13   End of Session Nurse Communication: Mobility status  Activity Tolerance: Patient limited by fatigue;Patient limited by lethargy Patient left: in bed;with call bell/phone within reach;with bed alarm set  OT Visit Diagnosis: Unsteadiness on feet (R26.81);Other abnormalities of gait and mobility (R26.89);Muscle weakness (generalized)  (M62.81);Pain Pain - part of body: Ankle and joints of foot                Time: 1346-1415 OT Time Calculation (min): 29 min Charges:  OT General Charges $OT Visit: 1 Visit OT Evaluation $OT Eval Moderate Complexity: [place], OTR/L Acute Rehab Pager: [phone] Office: [place] 04/15/2020, 4:45 PM

## 2020-04-15 NOTE — Evaluation (Signed)
Physical Therapy Evaluation Patient Details Name: Jay Holloway MRN: [id number] DOB: Apr 22, 1946 Today's Date: 04/15/2020   History of Present Illness  74 y.o. male with medical history significant of hypertension, hyperlipidemia, diabetes mellitus, prostate cancer status post radical prostatectomy in 2013, GERD, CKD stage III presents to emergency department with generalized weakness. Reports that he slipped out of chair however denies any injuries or head trauma (of note, pt admitted late August 2021 for same). HEad CT and MRI brain negative; likely dehydration.  Clinical Impression   Pt admitted with above diagnosis. Patient recently discharged to SNF at the end of August. He reports he came from home, however ?reliability. He currently requires +2 mod assist for bed mobility and resists attempts to stand (even with 2 person assist, ?fear of falling). Demonstrates decreased cognition.  Pt currently with functional limitations due to the deficits listed below (see PT Problem List). Pt will benefit from skilled PT to increase their independence and safety with mobility to allow discharge to the venue listed below.       Follow Up Recommendations SNF;Supervision/Assistance - 24 hour    Equipment Recommendations  Other (comment) (TBD next venue)    Recommendations for Other Services       Precautions / Restrictions Precautions Precautions: Fall Precaution Comments: fall from chair at home      Mobility  Bed Mobility Overal bed mobility: Needs Assistance Bed Mobility: Rolling;Sidelying to Sit;Sit to Supine Rolling: Mod assist Sidelying to sit: Mod assist;HOB elevated (with rail)   Sit to supine: Mod assist;+2 for physical assistance;+2 for safety/equipment   General bed mobility comments: poor initiation for coming to sit EOB; as therapist began to move/facilitate movement, he joined in   Transfers                 General transfer comment: attempted from elevated bed due to  pt's height with pt resisting  Ambulation/Gait                Stairs            Wheelchair Mobility    Modified Rankin (Stroke Patients Only)       Balance Overall balance assessment: Needs assistance Sitting-balance support: Single extremity supported;Feet supported Sitting balance-Leahy Scale: Poor Sitting balance - Comments: posterior bias; imbalance when attending to brushing teeth required min assist                                     Pertinent Vitals/Pain Pain Assessment: Faces Faces Pain Scale: Hurts even more Pain Location: LLE, foot Pain Descriptors / Indicators: Grimacing Pain Intervention(s): Limited activity within patient's tolerance;Monitored during session;Repositioned    Home Living Family/patient expects to be discharged to:: Private residence Living Arrangements: Alone Available Help at Discharge: Friend(s);Available PRN/intermittently Type of Home: House Home Access: Stairs to enter Entrance Stairs-Rails: Left Entrance Stairs-Number of Steps: 3 Home Layout: One level Home Equipment: Walker - 2 wheels;Cane - single point;Grab bars - tub/shower Additional Comments: All info from prior record; pt unable to state    Prior Function Level of Independence: Needs assistance   Gait / Transfers Assistance Needed: Uses RW vs cane depending on the day. Reports that he sometimes needs help, then reports he helps himself.   ADL's / Homemaking Assistance Needed: Reports independence with ADLs  Comments: All information from Aug 2021 admission; pt unable to report     Hand Dominance   Dominant  Hand: Right    Extremity/Trunk Assessment   Upper Extremity Assessment Upper Extremity Assessment: Defer to OT evaluation    Lower Extremity Assessment Lower Extremity Assessment: Generalized weakness;Difficult to assess due to impaired cognition    Cervical / Trunk Assessment Cervical / Trunk Assessment: Kyphotic  Communication    Communication: Other (comment) (parrots/repeats what is said to him)  Cognition Arousal/Alertness: Lethargic Behavior During Therapy: Flat affect Overall Cognitive Status: No family/caregiver present to determine baseline cognitive functioning Area of Impairment: Following commands;Attention;Memory;Problem solving                   Current Attention Level: Sustained Memory: Decreased short-term memory Following Commands: Follows one step commands inconsistently;Follows one step commands with increased time     Problem Solving: Slow processing;Requires verbal cues;Requires tactile cues General Comments: repeatedly trying to remove cap from toothpaste after he took it off and continued despite OT telling and showing him she had the cap already      General Comments General comments (skin integrity, edema, etc.): HR 115-124 bpm; BP 124/76 sitting; sats 100% RA    Exercises     Assessment/Plan    PT Assessment Patient needs continued PT services  PT Problem List Decreased strength;Decreased range of motion;Decreased activity tolerance;Decreased balance;Decreased mobility;Decreased cognition;Decreased knowledge of use of DME;Decreased safety awareness;Decreased knowledge of precautions;Decreased coordination;Pain       PT Treatment Interventions Gait training;DME instruction;Functional mobility training;Therapeutic activities;Therapeutic exercise;Balance training;Patient/family education;Cognitive remediation    PT Goals (Current goals can be found in the Care Plan section)  Acute Rehab PT Goals Patient Stated Goal: patient agrees wants to get stronger PT Goal Formulation: Patient unable to participate in goal setting Time For Goal Achievement: 04/29/20 Potential to Achieve Goals: Fair    Frequency Min 2X/week   Barriers to discharge Decreased caregiver support lives alone; ?friend assists him    Co-evaluation PT/OT/SLP Co-Evaluation/Treatment: Yes Reason for  Co-Treatment: Necessary to address cognition/behavior during functional activity;For patient/therapist safety;To address functional/ADL transfers PT goals addressed during session: Mobility/safety with mobility;Balance;Strengthening/ROM         AM-PAC PT "6 Clicks" Mobility  Outcome Measure Help needed turning from your back to your side while in a flat bed without using bedrails?: A Lot Help needed moving from lying on your back to sitting on the side of a flat bed without using bedrails?: A Lot Help needed moving to and from a bed to a chair (including a wheelchair)?: Total Help needed standing up from a chair using your arms (e.g., wheelchair or bedside chair)?: Total Help needed to walk in hospital room?: Total Help needed climbing 3-5 steps with a railing? : Total 6 Click Score: 8    End of Session   Activity Tolerance: Patient tolerated treatment well Patient left: in bed;with call bell/phone within reach;with bed alarm set;with SCD's reapplied Nurse Communication: Mobility status;Other (comment) (grimace with LLE movement) PT Visit Diagnosis: History of falling (Z91.81);Muscle weakness (generalized) (M62.81)    Time: 9458-5929 PT Time Calculation (min) (ACUTE ONLY): 30 min   Charges:   PT Evaluation $PT Eval Moderate Complexity: 1 Mod           Arby Barrette, PT Pager [phone]   Rexanne Mano 04/15/2020, 2:33 PM

## 2020-04-15 NOTE — Progress Notes (Signed)
PROGRESS NOTE    Jay Holloway  WCH:[id number] DOB: 17-Jan-1946 DOA: 04/14/2020 PCP: [name], MD   Brief Narrative:  Jay Holloway is a 74 y.o. male with medical history significant of hypertension, hyperlipidemia, diabetes mellitus, prostate cancer status post radical prostatectomy in 2013, GERD, CKD stage III presents to emergency department with generalized weakness.  Patient tells me that he feels weak and not feeling well overall since 1-1/2 weeks.  Reports that he slipped out of chair however denies any injuries or head trauma.  Reports that his generalized weakness is getting worse and now that he cannot able to walk.  Patient denies any change in speech (he tells me that his speech is always like that), headache, blurry vision, lightheadedness, dizziness, numbness, weakness, tingling sensation in hands or feet, facial drop, nausea, vomiting, diarrhea, abdominal pain, chest pain, shortness of breath, palpitation, urinary or bowel changes.  He admitted on 8/24 with fall in DKA and discharged to SNF on 8/27.  He denies history of smoking, alcohol, street drug use.  He has chronic bilateral lower extremity edema however denies worsening swelling, redness, orthopnea or PND.    Assessment & Plan:   Principal Problem:   Weakness Active Problems:   DM (diabetes mellitus) (HCC)   Prostate cancer ([place])   Hypertension   HLD (hyperlipidemia)   Acute on chronic kidney failure (HCC)   Hyperkalemia   GERD (gastroesophageal reflux disease)   Generalized weakness   Generalized weakness: -Likely secondary to dehydration and acidosis. -Continue IV fluids at increased rate.  CT head is negative for acute findings.  COVID-19 is neg.  Chest x-ray is negative for pneumonia.  Patient is afebrile with no leukocytosis -As per EDP: New onset of slurred speech however patient denies any changes in his speech.  MRI was negative -Consult PT/OT. -On fall precautions  AKI on CKD stage  III: -Creatinine: 2.12>1.79, GFR: 35>43 -Likely secondary to dehydration.  Continue IV fluids.  Hold nephrotoxic medication.   Anion gap metabolic acidosis with mild DKA resolving -Patient's blood sugar upon arrival 239, bicarb: 19>15>14>15, anion gap of 24>19 -will give one dose off bicarb as pt is improving and expect continued resolution at this point -Pt had mild DKA on admission but has improved since with closing gap, bg 171, potassium declining 4.4 -will continue aggressive fluid hydration which I increased in AM -at this point will not add insulin gtt as pt is improving -CHECK bmp 1600 - lactic acid 1, beta hydroxybutyric acid elevated on admission.    Type 2 diabetes mellitus: Well controlled.  Last A1c 6.2%. -Continue holding Metformin and Lantus and Ozempic for now. -C/W sliding scale insulin.  Monitor blood sugar closely.  Hyperkalemia: Potassium 5.3 -Improved to 4.4 -Likely secondary to worsening kidney function -No EKG changes.  Continue IV fluids  Hyperlipidemia: Continue statin  Hypertension: Stable -Hold home BP meds amlodipine for now.  Monitor blood pressure closely.  GERD: Continue PPI  History of prostate cancer: Status post radical prostatectomy in 2013.  DVT prophylaxis: Heparin SQ  Code Status: DNR    Code Status Orders  (From admission, onward)         Start     Ordered   04/14/20 1838  Do not attempt resuscitation (DNR)  Continuous       Question Answer Comment  In the event of cardiac or respiratory ARREST Do not call a code blue   In the event of cardiac or respiratory ARREST Do not perform Intubation, CPR, defibrillation or  ACLS   In the event of cardiac or respiratory ARREST Use medication by any route, position, wound care, and other measures to relive pain and suffering. May use oxygen, suction and manual treatment of airway obstruction as needed for comfort.      04/14/20 1839        Code Status History    Date Active Date  Inactive Code Status Order ID Comments User Context   04/14/2020 1836 04/14/2020 1839 DNR [id number]  Mckinley Jewel, MD ED   03/21/2020 1559 03/25/2020 0542 Full Code [id number]  Karmen Bongo, MD ED   11/05/2014 1947 11/07/2014 1106 Full Code [id number]  Crecencio Mc, MD Inpatient   11/05/2014 1942 11/05/2014 1947 Full Code [id number]  Crecencio Mc, MD ED   Advance Care Planning Activity     Family Communication: CALLED [phone], no answer Disposition Plan:   Pt will remain inpt, continuing to require iv fluid resus, close lab monitoring  Consults called: None Admission status: Inpatient   Consultants:   None  Procedures:  CT HEAD WO CONTRAST  Result Date: 04/14/2020 CLINICAL DATA:  Altered mental status. Generalized weakness for 1.5 weeks. Slurred speech. Slipped from chair. EXAM: CT HEAD WITHOUT CONTRAST TECHNIQUE: Contiguous axial images were obtained from the base of the skull through the vertex without intravenous contrast. COMPARISON:  None. FINDINGS: Brain: No evidence of parenchymal hemorrhage or extra-axial fluid collection. No mass lesion, mass effect, or midline shift. No CT evidence of acute infarction. Nonspecific mild subcortical and periventricular white matter hypodensity, most in keeping with chronic small vessel ischemic change. Cerebral volume is age appropriate. No ventriculomegaly. Vascular: No acute abnormality. Skull: No evidence of calvarial fracture. Sinuses/Orbits: The visualized paranasal sinuses are essentially clear. Other:  The mastoid air cells are unopacified. IMPRESSION: 1. No evidence of acute intracranial abnormality. No evidence of calvarial fracture. 2. Mild chronic small vessel ischemic changes in the cerebral white matter. Electronically Signed   By: Ilona Sorrel M.D.   On: 04/14/2020 17:56   MR BRAIN WO CONTRAST  Result Date: 04/14/2020 CLINICAL DATA:  74 year old male with altered mental status, weakness. EXAM: MRI HEAD WITHOUT CONTRAST TECHNIQUE:  Multiplanar, multiecho pulse sequences of the brain and surrounding structures were obtained without intravenous contrast. COMPARISON:  Head CT earlier today. FINDINGS: Brain: No restricted diffusion to suggest acute infarction. No midline shift, mass effect, evidence of mass lesion, ventriculomegaly, extra-axial collection or acute intracranial hemorrhage. Cervicomedullary junction and pituitary are within normal limits. Pearline Cables and white matter signal is within normal limits for age throughout the brain. No cortical encephalomalacia or chronic cerebral blood products identified. Vascular: Major intracranial vascular flow voids are preserved. 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 Disc degeneration elsewhere in the visible cervical spine. Visualized bone marrow signal is within normal limits. Sinuses/Orbits: Negative. Other: Mastoids are clear. Grossly normal visible internal auditory structures. Visible scalp and face appear negative. IMPRESSION: 1. No acute intracranial abnormality. Essentially normal for age noncontrast MRI appearance of the brain. 2. 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 Electronically Signed   By: Genevie Ann M.D.   On: 04/14/2020 22:12   DG Pelvis Portable  Result Date: 03/21/2020 CLINICAL DATA:  Pelvic pain after fall EXAM: PORTABLE PELVIS 1-2 VIEWS COMPARISON:  11/10/2014 FINDINGS: There is no evidence of pelvic fracture or diastasis. Moderate arthropathy of the bilateral hips. Degenerative changes within  the visualized lower lumbar spine with syndesmophytes and interspinous ligament ossification suggesting ankylosing spondylitis. IMPRESSION: 1. No acute osseous abnormality. 2. 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 Electronically Signed   By: Davina Poke D.O.   On: 03/21/2020 12:28   DG Chest  Port 1 View  Result Date: 04/14/2020 CLINICAL DATA:  Weakness. EXAM: PORTABLE CHEST 1 VIEW COMPARISON:  03/21/2020 FINDINGS: Cardiac silhouette is normal in size. No mediastinal or hilar masses. Linear opacities noted at the right lung base consistent with scarring or atelectasis, stable. Lungs otherwise clear. No pleural effusion or pneumothorax. Skeletal structures are grossly intact. IMPRESSION: No active disease. Electronically Signed   By: Lajean Manes M.D.   On: 04/14/2020 15:01   DG Chest Port 1 View  Result Date: 03/21/2020 CLINICAL DATA:  Weakness, fall EXAM: PORTABLE CHEST 1 VIEW COMPARISON:  12/24/2019 FINDINGS: The heart size and mediastinal contours are within normal limits. Chronic elevation of the right hemidiaphragm with associated right basilar scarring. No new focal airspace consolidation. No pleural effusion or pneumothorax. Degenerative changes of the shoulders, left worse than right. The visualized skeletal structures are unremarkable. IMPRESSION: 1. No active cardiopulmonary disease. 2. Chronic elevation of the right hemidiaphragm with associated right basilar scarring. Electronically Signed   By: Davina Poke D.O.   On: 03/21/2020 12:35   DG Swallowing Func-Speech Pathology  Result Date: 03/22/2020 Objective Swallowing Evaluation: Type of Study: MBS-Modified Barium Swallow Study  Patient Details Name: Charon Akamine MRN: [id number] Date of Birth: 1945-12-10 Today's Date: 03/22/2020 Time: SLP Start Time (ACUTE ONLY): 1405 -SLP Stop Time (ACUTE ONLY): 1425 SLP Time Calculation (min) (ACUTE ONLY): 20 min Past Medical History: Past Medical History: Diagnosis Date  Arthritis   trouble turning head side to side and up  Diabetes mellitus without complication (HCC)   Hemorrhoids   High cholesterol   Hypertension   Prostate cancer ([place])  Past Surgical History: Past Surgical History: Procedure Laterality Date  CHOLECYSTECTOMY N/A 11/11/2014  Procedure: LAPAROSCOPIC CHOLECYSTECTOMY WITH  INTRAOPERATIVE CHOLANGIOGRAM;  Surgeon: Erroll Luna, MD;  Location: [facility];  Service: General;  Laterality: N/A;  COLONOSCOPY N/A 11/09/2014  Procedure: COLONOSCOPY;  Surgeon: Juanita Craver, MD;  Location: [facility];  Service: Endoscopy;  Laterality: N/A;  CYSTOSCOPY WITH RETROGRADE PYELOGRAM, URETEROSCOPY AND STENT PLACEMENT Left 04/18/2014  Procedure: CYSTOSCOPY WITH RETROGRADE PYELOGRAM, URETEROSCOPY AND STENT PLACEMENT;  Surgeon: Raynelle Bring, MD;  Location: [facility];  Service: Urology;  Laterality: Left;  HOLMIUM LASER APPLICATION Left 7/65/4650  Procedure: HOLMIUM LASER APPLICATION;  Surgeon: Raynelle Bring, MD;  Location: [facility];  Service: Urology;  Laterality: Left;  LYMPHADENECTOMY  06/04/2012  Procedure: LYMPHADENECTOMY;  Surgeon: Dutch Gray, MD;  Location: [facility];  Service: Urology;  Laterality: Bilateral;  right  achilles tendon repair  yrs ago  [place]  06/04/2012  Procedure: ROBOTIC ASSISTED LAPAROSCOPIC RADICAL PROSTATECTOMY LEVEL 3;  Surgeon: Dutch Gray, MD;  Location: [facility];  Service: Urology;  Laterality: N/A; HPI: Pt is a 74 y/o male admitted secondary to fall from bed. Also found to be dehydrated. PMH includes HTN, prostate cancer, DM.  Subjective: pleasant, oriented Assessment / Plan / Recommendation CHL IP CLINICAL IMPRESSIONS 03/22/2020 Clinical Impression Patient presents with a mild-mod oral and a moderate pharyngeal dysphagia. During oral phase, patient exhibited delays in mastication and oral manipulation and anterior to posterior propulsion of regular solids, puree solids and minimally with thin liquids. Pharyngeal phase was characterized by swallow initiation delays to vallecular sinus, majority of regular solids and puree  solids bolus remaining in vallecular sinus post initial swallow. Vallecular residual did not transit with second cued swallow but with thin liquids sip, approximately 70% of vallecular residuals transited out. One instance  of penetration with full clearance of penetrate was observed with thin liquids sips and occured during the swallow. Patient exhibited decreased hyoid-laryngeal movement, decreased base of tongue strength as well as osteophytes which were pushing into pharynx and causing a structural narrowing of pharyngeal space. SLP Visit Diagnosis Dysphagia, oropharyngeal phase (R13.12) Attention and concentration deficit following -- Frontal lobe and executive function deficit following -- Impact on safety and function Mild aspiration risk   CHL IP TREATMENT RECOMMENDATION 03/22/2020 Treatment Recommendations Therapy as outlined in treatment plan below   Prognosis 03/22/2020 Prognosis for Safe Diet Advancement Good Barriers to Reach Goals -- Barriers/Prognosis Comment -- CHL IP DIET RECOMMENDATION 03/22/2020 SLP Diet Recommendations Dysphagia 1 (Puree) solids;Thin liquid Liquid Administration via Cup;Straw Medication Administration Crushed with puree Compensations Minimize environmental distractions;Small sips/bites;Slow rate;Follow solids with liquid;Multiple dry swallows after each bite/sip Postural Changes Remain semi-upright after after feeds/meals (Comment);Seated upright at 90 degrees   CHL IP OTHER RECOMMENDATIONS 03/22/2020 Recommended Consults -- Oral Care Recommendations Oral care BID Other Recommendations --   CHL IP FOLLOW UP RECOMMENDATIONS 03/22/2020 Follow up Recommendations Skilled Nursing facility   [facility] IP FREQUENCY AND DURATION 03/22/2020 Speech Therapy Frequency (ACUTE ONLY) min 2x/week Treatment Duration 1 week      CHL IP ORAL PHASE 03/22/2020 Oral Phase Impaired Oral - Pudding Teaspoon -- Oral - Pudding Cup -- Oral - Honey Teaspoon -- Oral - Honey Cup -- Oral - Nectar Teaspoon -- Oral - Nectar Cup -- Oral - Nectar Straw -- Oral - Thin Teaspoon -- Oral - Thin Cup Reduced posterior propulsion;Delayed oral transit;Weak lingual manipulation;Premature spillage Oral - Thin Straw Weak lingual manipulation;Delayed oral  transit;Reduced posterior propulsion;Premature spillage Oral - Puree Weak lingual manipulation;Delayed oral transit;Reduced posterior propulsion Oral - Mech Soft -- Oral - Regular Reduced posterior propulsion;Weak lingual manipulation;Impaired mastication;Delayed oral transit Oral - Multi-Consistency -- Oral - Pill Delayed oral transit;Reduced posterior propulsion;Weak lingual manipulation Oral Phase - Comment --  CHL IP PHARYNGEAL PHASE 03/22/2020 Pharyngeal Phase Impaired Pharyngeal- Pudding Teaspoon -- Pharyngeal -- Pharyngeal- Pudding Cup -- Pharyngeal -- Pharyngeal- Honey Teaspoon -- Pharyngeal -- Pharyngeal- Honey Cup -- Pharyngeal -- Pharyngeal- Nectar Teaspoon -- Pharyngeal -- Pharyngeal- Nectar Cup -- Pharyngeal -- Pharyngeal- Nectar Straw -- Pharyngeal -- Pharyngeal- Thin Teaspoon -- Pharyngeal -- Pharyngeal- Thin Cup Delayed swallow initiation-vallecula;Penetration/Aspiration during swallow;Pharyngeal residue - valleculae Pharyngeal Material enters airway, remains ABOVE vocal cords then ejected out Pharyngeal- Thin Straw Delayed swallow initiation-vallecula;Pharyngeal residue - valleculae Pharyngeal -- Pharyngeal- Puree Delayed swallow initiation-vallecula;Reduced laryngeal elevation;Reduced tongue base retraction;Pharyngeal residue - valleculae Pharyngeal -- Pharyngeal- Mechanical Soft -- Pharyngeal -- Pharyngeal- Regular Delayed swallow initiation-vallecula;Pharyngeal residue - valleculae Pharyngeal -- Pharyngeal- Multi-consistency -- Pharyngeal -- Pharyngeal- Pill Delayed swallow initiation-vallecula;Pharyngeal residue - valleculae Pharyngeal -- Pharyngeal Comment --  CHL IP CERVICAL ESOPHAGEAL PHASE 03/22/2020 Cervical Esophageal Phase WFL Pudding Teaspoon -- Pudding Cup -- Honey Teaspoon -- Honey Cup -- Nectar Teaspoon -- Nectar Cup -- Nectar Straw -- Thin Teaspoon -- Thin Cup -- Thin Straw -- Puree -- Mechanical Soft -- Regular -- Multi-consistency -- Pill -- Cervical Esophageal Comment -- Sonia Baller, MA, CCC-SLP Speech Therapy [facility] Pager: [phone]                Subjective: Pt reports he feels much better on my eval this AM,  Objective: Vitals:   04/15/20 0200 04/15/20 0312 04/15/20 0500 04/15/20 0747  BP: 114/64 112/63  125/69  Pulse: (!) 108 (!) 105    Resp:  17    Temp:  100 F (37.8 C)  98.2 F (36.8 C)  TempSrc:  Oral  Oral  SpO2: 99% 98%    Weight:   92.8 kg   Height:        Intake/Output Summary (Last 24 hours) at 04/15/2020 1443 Last data filed at 04/15/2020 0925 Gross per 24 hour  Intake 600 ml  Output 1380 ml  Net -780 ml   Filed Weights   04/14/20 1410 04/15/20 0500  Weight: 86.2 kg 92.8 kg    Examination:  Constitutional: NAD, calm, comfortable, on RA,, Eyes: PERRL, lids and conjunctivae normal ENMT: Mucous membranes are mildy dry. Posterior pharynx clear of any exudate or lesions.Normal dentition.  Neck: normal, supple, no masses, no thyromegaly Respiratory: clear to auscultation bilaterally, no wheezing, no crackles. Normal respiratory effort. No accessory muscle use.  Cardiovascular: Regular rate and rhythm, no murmurs / rubs / gallops.  Bilateral lower extremity 1+ edema positive. 2+ pedal pulses. No carotid bruits.  Abdomen: no tenderness, no masses palpated. No hepatosplenomegaly. Bowel sounds positive.  Musculoskeletal: no clubbing / cyanosis. No joint deformity upper and lower extremities. Good ROM, no contractures. Normal muscle tone.  Skin: no rashes, lesions, ulcers. No induration Neurologic: CN 2-12 grossly intact. Sensation intact, DTR normal.    Psychiatric: Normal judgment and insight. Alert and oriented x 3. Normal mood.     Data Reviewed: I have personally reviewed following labs and imaging studies  CBC: Recent Labs  Lab 04/14/20 1600 04/14/20 1617 04/15/20 0154  WBC 9.7  --  10.0  NEUTROABS 6.4  --   --   HGB 12.4* 14.6 12.6*  HCT 42.9 43.0 43.0  MCV 97.1  --  95.8  PLT 282  --  465   Basic  Metabolic Panel: Recent Labs  Lab 04/14/20 1600 04/14/20 1617 04/14/20 1814 04/14/20 1838 04/14/20 2245 04/15/20 0154 04/15/20 1014  NA 143 141  --   --  143 144 146*  K 5.3* 4.9  --   --  4.9 4.8 4.4  CL 102 104  --   --  105 106 112*  CO2 17*  --   --   --  15* 14* 15*  GLUCOSE 239* 231*  --   --  198* 190* 171*  BUN 12 19  --   --  13 11 14   CREATININE 2.12* 0.90  --   --  1.79* 1.75* 1.72*  CALCIUM 9.3  --   --   --  9.1 9.0 9.1  MG  --   --  2.1  --   --   --   --   PHOS  --   --   --  4.9*  --   --   --    GFR: Estimated Creatinine Clearance: 42 mL/min (A) (by C-G formula based on SCr of 1.72 mg/dL (H)). Liver Function Tests: Recent Labs  Lab 04/14/20 1600 04/15/20 0154  AST 20 12*  ALT 21 20  ALKPHOS 75 66  BILITOT 2.0* 1.8*  PROT 6.7 6.1*  ALBUMIN 3.7 3.4*   No results for input(s): LIPASE, AMYLASE in the last 168 hours. No results for input(s): AMMONIA in the last 168 hours. Coagulation Profile: Recent Labs  Lab 04/14/20 1649  INR 1.0   Cardiac Enzymes: No results for input(s):  CKTOTAL, CKMB, CKMBINDEX, TROPONINI in the last 168 hours. BNP (last 3 results) No results for input(s): PROBNP in the last 8760 hours. HbA1C: No results for input(s): HGBA1C in the last 72 hours. CBG: Recent Labs  Lab 04/14/20 1426 04/14/20 2236 04/15/20 0603 04/15/20 1141 04/15/20 1225  GLUCAP 226* 185* 208* 165* 177*   Lipid Profile: No results for input(s): CHOL, HDL, LDLCALC, TRIG, CHOLHDL, LDLDIRECT in the last 72 hours. Thyroid Function Tests: Recent Labs    04/14/20 2245  TSH 0.668   Anemia Panel: No results for input(s): VITAMINB12, FOLATE, FERRITIN, TIBC, IRON, RETICCTPCT in the last 72 hours. Sepsis Labs: Recent Labs  Lab 04/14/20 2245  LATICACIDVEN 1.0    Recent Results (from the past 240 hour(s))  SARS Coronavirus 2 by RT PCR (hospital order, performed in [facility] hospital lab) Nasopharyngeal Nasopharyngeal Swab     Status: None   Collection  Time: 04/14/20  4:56 PM   Specimen: Nasopharyngeal Swab  Result Value Ref Range Status   SARS Coronavirus 2 NEGATIVE NEGATIVE Final    Comment: (NOTE) SARS-CoV-2 target nucleic acids are NOT DETECTED.  The SARS-CoV-2 RNA is generally detectable in upper and lower respiratory specimens during the acute phase of infection. The lowest concentration of SARS-CoV-2 viral copies this assay can detect is 250 copies / mL. A negative result does not preclude SARS-CoV-2 infection and should not be used as the sole basis for treatment or other patient management decisions.  A negative result may occur with improper specimen collection / handling, submission of specimen other than nasopharyngeal swab, presence of viral mutation(s) within the areas targeted by this assay, and inadequate number of viral copies (<250 copies / mL). A negative result must be combined with clinical observations, patient history, and epidemiological information.  Fact Sheet for Patients:   StrictlyIdeas.no  Fact Sheet for Healthcare Providers: BankingDealers.co.za  This test is not yet approved or  cleared by the [place] FDA and has been authorized for detection and/or diagnosis of SARS-CoV-2 by FDA under an Emergency Use Authorization (EUA).  This EUA will remain in effect (meaning this test can be used) for the duration of the COVID-19 declaration under Section 564(b)(1) of the Act, 21 U.S.C. section 360bbb-3(b)(1), unless the authorization is terminated or revoked sooner.  Performed at [facility], [place] [address], [place], [place] [address]          Radiology Studies: CT HEAD WO CONTRAST  Result Date: 04/14/2020 CLINICAL DATA:  Altered mental status. Generalized weakness for 1.5 weeks. Slurred speech. Slipped from chair. EXAM: CT HEAD WITHOUT CONTRAST TECHNIQUE: Contiguous axial images were obtained from the base of the skull through the vertex  without intravenous contrast. COMPARISON:  None. FINDINGS: Brain: No evidence of parenchymal hemorrhage or extra-axial fluid collection. No mass lesion, mass effect, or midline shift. No CT evidence of acute infarction. Nonspecific mild subcortical and periventricular white matter hypodensity, most in keeping with chronic small vessel ischemic change. Cerebral volume is age appropriate. No ventriculomegaly. Vascular: No acute abnormality. Skull: No evidence of calvarial fracture. Sinuses/Orbits: The visualized paranasal sinuses are essentially clear. Other:  The mastoid air cells are unopacified. IMPRESSION: 1. No evidence of acute intracranial abnormality. No evidence of calvarial fracture. 2. Mild chronic small vessel ischemic changes in the cerebral white matter. Electronically Signed   By: Ilona Sorrel M.D.   On: 04/14/2020 17:56   MR BRAIN WO CONTRAST  Result Date: 04/14/2020 CLINICAL DATA:  74 year old male with altered mental status,  weakness. EXAM: MRI HEAD WITHOUT CONTRAST TECHNIQUE: Multiplanar, multiecho pulse sequences of the brain and surrounding structures were obtained without intravenous contrast. COMPARISON:  Head CT earlier today. FINDINGS: Brain: No restricted diffusion to suggest acute infarction. No midline shift, mass effect, evidence of mass lesion, ventriculomegaly, extra-axial collection or acute intracranial hemorrhage. Cervicomedullary junction and pituitary are within normal limits. Pearline Cables and white matter signal is within normal limits for age throughout the brain. No cortical encephalomalacia or chronic cerebral blood products identified. Vascular: Major intracranial vascular flow voids are preserved. 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 Disc degeneration elsewhere in the visible cervical spine. Visualized bone marrow signal is within normal limits.  Sinuses/Orbits: Negative. Other: Mastoids are clear. Grossly normal visible internal auditory structures. Visible scalp and face appear negative. IMPRESSION: 1. No acute intracranial abnormality. Essentially normal for age noncontrast MRI appearance of the brain. 2. 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 Electronically Signed   By: Genevie Ann M.D.   On: 04/14/2020 22:12   DG Chest Port 1 View  Result Date: 04/14/2020 CLINICAL DATA:  Weakness. EXAM: PORTABLE CHEST 1 VIEW COMPARISON:  03/21/2020 FINDINGS: Cardiac silhouette is normal in size. No mediastinal or hilar masses. Linear opacities noted at the right lung base consistent with scarring or atelectasis, stable. Lungs otherwise clear. No pleural effusion or pneumothorax. Skeletal structures are grossly intact. IMPRESSION: No active disease. Electronically Signed   By: Lajean Manes M.D.   On: 04/14/2020 15:01        Scheduled Meds:  heparin  5,000 Units Subcutaneous Q8H   insulin aspart  0-15 Units Subcutaneous TID WC   insulin aspart  0-5 Units Subcutaneous QHS   sodium chloride flush  3 mL Intravenous Once   Continuous Infusions:  sodium chloride     sodium chloride 150 mL/hr at 04/15/20 1240     LOS: 1 day    Time spent: 16 min    Nicolette Bang, MD [facility]  If 7PM-7AM, please contact night-coverage  04/15/2020, 2:43 PM

## 2020-04-15 NOTE — Evaluation (Addendum)
Clinical/Bedside Swallow Evaluation Patient Details  Name: Jay Holloway MRN: [id number] Date of Birth: [birth year]  Today's Date: 04/15/2020 Time: SLP Start Time (ACUTE ONLY): 0844 SLP Stop Time (ACUTE ONLY): 0907 SLP Time Calculation (min) (ACUTE ONLY): 23 min  Past Medical History:  Past Medical History:  Diagnosis Date  . Arthritis    trouble turning head side to side and up  . Diabetes mellitus without complication ([place])   . Hemorrhoids   . High cholesterol   . Hypertension   . Prostate cancer [facility])    Past Surgical History:  Past Surgical History:  Procedure Laterality Date  . CHOLECYSTECTOMY N/A 11/11/2014   Procedure: LAPAROSCOPIC CHOLECYSTECTOMY WITH INTRAOPERATIVE CHOLANGIOGRAM;  Surgeon: Erroll Luna, MD;  Location: [place];  Service: General;  Laterality: N/A;  . COLONOSCOPY N/A 11/09/2014   Procedure: COLONOSCOPY;  Surgeon: Juanita Craver, MD;  Location: [facility] ENDOSCOPY;  Service: Endoscopy;  Laterality: N/A;  . CYSTOSCOPY WITH RETROGRADE PYELOGRAM, URETEROSCOPY AND STENT PLACEMENT Left 04/18/2014   Procedure: CYSTOSCOPY WITH RETROGRADE PYELOGRAM, URETEROSCOPY AND STENT PLACEMENT;  Surgeon: Raynelle Bring, MD;  Location: [facility] ORS;  Service: Urology;  Laterality: Left;  . HOLMIUM LASER APPLICATION Left 11/04/8117   Procedure: HOLMIUM LASER APPLICATION;  Surgeon: Raynelle Bring, MD;  Location: [facility];  Service: Urology;  Laterality: Left;  . LYMPHADENECTOMY  06/04/2012   Procedure: LYMPHADENECTOMY;  Surgeon: Dutch Gray, MD;  Location: [facility];  Service: Urology;  Laterality: Bilateral;  . right  achilles tendon repair  yrs ago  . ROBOT ASSISTED LAPAROSCOPIC RADICAL PROSTATECTOMY  06/04/2012   Procedure: ROBOTIC ASSISTED LAPAROSCOPIC RADICAL PROSTATECTOMY LEVEL 3;  Surgeon: Dutch Gray, MD;  Location: [facility];  Service: Urology;  Laterality: N/A;   HPI:  74 y.o. male with medical history significant of hypertension, hyperlipidemia, diabetes mellitus, prostate cancer status post radical  prostatectomy in 2013, GERD, CKD stage III presents to emergency department with generalized weakness CXR on 04/14/20 indicated no active disease, but Linear opacities noted at the right lung base consistent with scarring or atelectasis, stable. Lungs otherwise clear.  MBS on 03/22/20 indicated: Patient presents with a mild-mod oral and a moderate pharyngeal dysphagia. During oral phase, patient exhibited delays in mastication and oral manipulation and anterior to posterior propulsion of regular solids, puree solids and minimally with thin liquids. Pharyngeal phase was characterized by swallow initiation delays to vallecular sinus, majority of regular solids and puree solids bolus remaining in vallecular sinus post initial swallow. Vallecular residual did not transit with second cued swallow but with thin liquids sip, approximately 70% of vallecular residuals transited out. One instance of penetration with full clearance of penetrate was observed with thin liquids sips and occured during the swallow. Patient exhibited decreased hyoid-laryngeal movement, decreased base of tongue strength as well as osteophytes which were pushing into pharynx and causing a structural narrowing of pharyngeal space; MRI 04/14/20 negative.    Assessment / Plan / Recommendation Clinical Impression  Pt presents with oropharyngeal dysphagia characterized by decreased oral propulsion/manipulation, slight oral holding, multiple swallows and delayed throat clearing/cough noted with thin liquids and intermittently with puree/soft solids. MBSS results during last hospitalization (8/21) indicated oropharyngeal dysphagia noted with timing delay of initation of swallow and vallecular pooling/stasis with eventual 70% clearance with strategies utilized; recommend continuing heart healthy regular diet with nectar-thickened liquids with swallowing strategies for multiple swallows, effortful swallow and liquid wash prn during intake; ST will f/u for  diet tolerance and education re: swallowing safety implementing techniques.  Thank you for this consult.  SLP Visit Diagnosis: Dysphagia, oropharyngeal phase (R13.12)    Aspiration Risk  Mild aspiration risk;Moderate aspiration risk    Diet Recommendation   Regular (heart healthy)/nectar-thick liquids  Medication Administration: Crushed with puree    Other  Recommendations Oral Care Recommendations: Oral care BID Other Recommendations: Order thickener from pharmacy   Follow up Recommendations Skilled Nursing facility      Frequency and Duration min 2x/week  1 week       Prognosis Prognosis for Safe Diet Advancement: Good      Swallow Study   General Date of Onset: 04/14/20 HPI: 74 y.o. male with medical history significant of hypertension, hyperlipidemia, diabetes mellitus, prostate cancer status post radical prostatectomy in 2013, GERD, CKD stage III presents to emergency department with generalized weakness Type of Study: Bedside Swallow Evaluation Previous Swallow Assessment: MBS 03/22/20; pharyngeal dysphagia Diet Prior to this Study: Dysphagia 3 (soft);Thin liquids Temperature Spikes Noted: Yes Respiratory Status: Room air History of Recent Intubation: No Behavior/Cognition: Alert;Cooperative Oral Cavity Assessment: Within Functional Limits Oral Care Completed by SLP: No Oral Cavity - Dentition: Poor condition;Missing dentition Self-Feeding Abilities: Able to feed self;Needs assist Patient Positioning: Upright in bed Baseline Vocal Quality: Low vocal intensity Volitional Cough: Weak Volitional Swallow: Able to elicit    Oral/Motor/Sensory Function Overall Oral Motor/Sensory Function: Generalized oral weakness   Ice Chips Ice chips: Impaired Presentation: Spoon Oral Phase Functional Implications: Prolonged oral transit Pharyngeal Phase Impairments: Suspected delayed Swallow;Multiple swallows;Cough - Delayed   Thin Liquid Thin Liquid: Impaired Presentation:  Cup;Self Fed;Spoon;Straw Pharyngeal  Phase Impairments: Suspected delayed Swallow;Multiple swallows;Cough - Immediate;Cough - Delayed    Nectar Thick Nectar Thick Liquid: Within functional limits Presentation: Cup   Honey Thick Honey Thick Liquid: Not tested   Puree Puree: Within functional limits Presentation: Spoon   Solid     Solid: Impaired Presentation: Spoon Oral Phase Impairments: Impaired mastication Oral Phase Functional Implications: Impaired mastication Pharyngeal Phase Impairments: Suspected delayed Swallow Other Comments:  (required liquid wash w/ NTL)      Elvina Sidle, M.S., CCC-SLP 04/15/2020,9:37 AM

## 2020-04-16 ENCOUNTER — Other Ambulatory Visit: Payer: Self-pay

## 2020-04-16 DIAGNOSIS — J9602 Acute respiratory failure with hypercapnia: Secondary | ICD-10-CM

## 2020-04-16 LAB — BASIC METABOLIC PANEL
Anion gap: 13 (ref 5–15)
Anion gap: 11 (ref 5–15)
Anion gap: 11 (ref 5–15)
BUN: 12 mg/dL (ref 8–23)
BUN: 11 mg/dL (ref 8–23)
BUN: 9 mg/dL (ref 8–23)
CO2: 22 mmol/L (ref 22–32)
CO2: 26 mmol/L (ref 22–32)
CO2: 29 mmol/L (ref 22–32)
Calcium: 8.8 mg/dL — ABNORMAL LOW (ref 8.9–10.3)
Calcium: 9 mg/dL (ref 8.9–10.3)
Calcium: 9 mg/dL (ref 8.9–10.3)
Chloride: 113 mmol/L — ABNORMAL HIGH (ref 98–111)
Chloride: 111 mmol/L (ref 98–111)
Chloride: 112 mmol/L — ABNORMAL HIGH (ref 98–111)
Creatinine, Ser: 1.5 mg/dL — ABNORMAL HIGH (ref 0.61–1.24)
Creatinine, Ser: 1.4 mg/dL — ABNORMAL HIGH (ref 0.61–1.24)
Creatinine, Ser: 1.34 mg/dL — ABNORMAL HIGH (ref 0.61–1.24)
GFR calc Af Amer: 53 mL/min — ABNORMAL LOW (ref >60)
GFR calc Af Amer: 57 mL/min — ABNORMAL LOW (ref >60)
GFR calc Af Amer: >60 mL/min (ref >60)
GFR calc non Af Amer: 46 mL/min — ABNORMAL LOW (ref >60)
GFR calc non Af Amer: 49 mL/min — ABNORMAL LOW (ref >60)
GFR calc non Af Amer: 52 mL/min — ABNORMAL LOW (ref >60)
Glucose, Bld: 148 mg/dL — ABNORMAL HIGH (ref 70–99)
Glucose, Bld: 139 mg/dL — ABNORMAL HIGH (ref 70–99)
Glucose, Bld: 163 mg/dL — ABNORMAL HIGH (ref 70–99)
Potassium: 5.2 mmol/L — ABNORMAL HIGH (ref 3.5–5.1)
Potassium: 4.2 mmol/L (ref 3.5–5.1)
Potassium: 3.9 mmol/L (ref 3.5–5.1)
Sodium: 148 mmol/L — ABNORMAL HIGH (ref 135–145)
Sodium: 148 mmol/L — ABNORMAL HIGH (ref 135–145)
Sodium: 152 mmol/L — ABNORMAL HIGH (ref 135–145)

## 2020-04-16 LAB — CBC WITH DIFFERENTIAL/PLATELET
Abs Immature Granulocytes: 0.04 10*3/uL (ref 0.00–0.07)
Basophils Absolute: 0 10*3/uL (ref 0.0–0.1)
Basophils Relative: 0 %
Eosinophils Absolute: 0 10*3/uL (ref 0.0–0.5)
Eosinophils Relative: 0 %
HCT: 35.9 % — ABNORMAL LOW (ref 39.0–52.0)
Hemoglobin: 10.9 g/dL — ABNORMAL LOW (ref 13.0–17.0)
Immature Granulocytes: 1 %
Lymphocytes Relative: 33 %
Lymphs Abs: 2.6 10*3/uL (ref 0.7–4.0)
MCH: 29.1 pg (ref 26.0–34.0)
MCHC: 30.4 g/dL (ref 30.0–36.0)
MCV: 95.7 fL (ref 80.0–100.0)
Monocytes Absolute: 0.5 10*3/uL (ref 0.1–1.0)
Monocytes Relative: 6 %
Neutro Abs: 4.8 10*3/uL (ref 1.7–7.7)
Neutrophils Relative %: 60 %
Platelets: 206 10*3/uL (ref 150–400)
RBC: 3.75 MIL/uL — ABNORMAL LOW (ref 4.22–5.81)
RDW: 14.9 % (ref 11.5–15.5)
WBC: 7.9 10*3/uL (ref 4.0–10.5)
nRBC: 0 % (ref 0.0–0.2)

## 2020-04-16 LAB — BLOOD GAS, ARTERIAL
Acid-Base Excess: 0.8 mmol/L (ref 0.0–2.0)
Acid-Base Excess: 2.3 mmol/L — ABNORMAL HIGH (ref 0.0–2.0)
Bicarbonate: 28.4 mmol/L — ABNORMAL HIGH (ref 20.0–28.0)
Bicarbonate: 28.7 mmol/L — ABNORMAL HIGH (ref 20.0–28.0)
Drawn by: 28338
Drawn by: 24686
FIO2: 32
FIO2: 30
O2 Saturation: 98.6 %
O2 Saturation: 98.1 %
Patient temperature: 36.6
Patient temperature: 37
pCO2 arterial: 77.3 mmHg (ref 32.0–48.0)
pCO2 arterial: 64.8 mmHg — ABNORMAL HIGH (ref 32.0–48.0)
pH, Arterial: 7.187 — CL (ref 7.350–7.450)
pH, Arterial: 7.268 — ABNORMAL LOW (ref 7.350–7.450)
pO2, Arterial: 113 mmHg — ABNORMAL HIGH (ref 83.0–108.0)
pO2, Arterial: 97.2 mmHg (ref 83.0–108.0)

## 2020-04-16 LAB — GLUCOSE, CAPILLARY
Glucose-Capillary: 112 mg/dL — ABNORMAL HIGH (ref 70–99)
Glucose-Capillary: 119 mg/dL — ABNORMAL HIGH (ref 70–99)
Glucose-Capillary: 127 mg/dL — ABNORMAL HIGH (ref 70–99)
Glucose-Capillary: 131 mg/dL — ABNORMAL HIGH (ref 70–99)
Glucose-Capillary: 132 mg/dL — ABNORMAL HIGH (ref 70–99)
Glucose-Capillary: 135 mg/dL — ABNORMAL HIGH (ref 70–99)
Glucose-Capillary: 141 mg/dL — ABNORMAL HIGH (ref 70–99)
Glucose-Capillary: 144 mg/dL — ABNORMAL HIGH (ref 70–99)
Glucose-Capillary: 146 mg/dL — ABNORMAL HIGH (ref 70–99)
Glucose-Capillary: 146 mg/dL — ABNORMAL HIGH (ref 70–99)
Glucose-Capillary: 150 mg/dL — ABNORMAL HIGH (ref 70–99)
Glucose-Capillary: 165 mg/dL — ABNORMAL HIGH (ref 70–99)
Glucose-Capillary: 168 mg/dL — ABNORMAL HIGH (ref 70–99)
Glucose-Capillary: 287 mg/dL — ABNORMAL HIGH (ref 70–99)

## 2020-04-16 LAB — TROPONIN I (HIGH SENSITIVITY)
Troponin I (High Sensitivity): 38 ng/L — ABNORMAL HIGH (ref <18)
Troponin I (High Sensitivity): 41 ng/L — ABNORMAL HIGH (ref <18)

## 2020-04-16 LAB — BETA-HYDROXYBUTYRIC ACID
Beta-Hydroxybutyric Acid: 4.93 mmol/L — ABNORMAL HIGH (ref 0.05–0.27)
Beta-Hydroxybutyric Acid: 2.64 mmol/L — ABNORMAL HIGH (ref 0.05–0.27)

## 2020-04-16 MED ORDER — INSULIN ASPART 100 UNIT/ML ~~LOC~~ SOLN
0.0000 [IU] | Freq: Three times a day (TID) | SUBCUTANEOUS | Status: DC
  Administered 2020-04-16 (×2): 2 [IU] via SUBCUTANEOUS

## 2020-04-16 MED ORDER — INSULIN ASPART 100 UNIT/ML ~~LOC~~ SOLN: 0.0000 [IU] | Freq: Every day | SUBCUTANEOUS | Status: DC

## 2020-04-16 MED ORDER — LACTATED RINGERS IV SOLN: INTRAVENOUS | Status: DC

## 2020-04-16 MED ORDER — INSULIN ASPART 100 UNIT/ML ~~LOC~~ SOLN
0.0000 [IU] | SUBCUTANEOUS | Status: DC
  Administered 2020-04-16: 1 [IU] via SUBCUTANEOUS
  Administered 2020-04-16 – 2020-04-17 (×2): 2 [IU] via SUBCUTANEOUS
  Administered 2020-04-17 (×2): 1 [IU] via SUBCUTANEOUS
  Administered 2020-04-17: 2 [IU] via SUBCUTANEOUS
  Administered 2020-04-17: 1 [IU] via SUBCUTANEOUS
  Administered 2020-04-18 (×4): 2 [IU] via SUBCUTANEOUS
  Administered 2020-04-18 – 2020-04-19 (×3): 1 [IU] via SUBCUTANEOUS

## 2020-04-16 MED ORDER — INSULIN DETEMIR 100 UNIT/ML ~~LOC~~ SOLN
5.0000 [IU] | SUBCUTANEOUS | Status: DC
  Administered 2020-04-16 – 2020-04-19 (×4): 5 [IU] via SUBCUTANEOUS
  Filled 2020-04-16 (×4): qty 0.05

## 2020-04-16 NOTE — Progress Notes (Signed)
Pt's potassium 5.2, [place] informed.

## 2020-04-16 NOTE — Social Work (Signed)
CSW met with patient bedside to complete assessment. Patient appeared sleeping and aroused, opening eyes when CSW knocked on door. CSW attempted to engage patient without success. CSW contacted patient's friend Arbie Cookey and left a voicemail, awaiting a callback.   Darra Lis, [place] Social Worker

## 2020-04-16 NOTE — Progress Notes (Signed)
   04/16/20 1236  Assess: MEWS Score  Temp 97.7 F (36.5 C)  BP 126/73  ECG Heart Rate (!) 106  Resp 18  Level of Consciousness Responds to Voice  SpO2 100 %  O2 Device Venturi Mask  Assess: if the MEWS score is Yellow or Red  Were vital signs taken at a resting state? Yes  Focused Assessment Change from prior assessment (see assessment flowsheet)  Early Detection of Sepsis Score *See Row Information* Low  MEWS guidelines implemented *See Row Information* Yes  Treat  Pain Scale Faces  Pain Score 0  Take Vital Signs  Increase Vital Sign Frequency  Yellow: Q 2hr X 2 then Q 4hr X 2, if remains yellow, continue Q 4hrs  Escalate  MEWS: Escalate Yellow: discuss with charge nurse/RN and consider discussing with provider and RRT  Notify: Charge Nurse/RN  Name of Charge Nurse/RN Notified Levada Dy  Date Charge Nurse/RN Notified 04/16/20  Time Charge Nurse/RN Notified 1300  Notify: Provider  Provider Name/Title [facility]  Date Provider Notified 04/16/20  Time Provider Notified 1300  Notification Type Page  Notification Reason Change in status  Response See new orders  Date of Provider Response 04/16/20  Time of Provider Response 1300  Notify: Rapid Response  Name of Rapid Response RN Notified Saralyn Pilar  Date Rapid Response Notified 04/16/20  Time Rapid Response Notified 1300

## 2020-04-16 NOTE — Progress Notes (Signed)
PROGRESS NOTE    Jay Holloway  BJS:[id number] DOB: [birth year] DOA: 04/14/2020 PCP: [name], MD   Brief Narrative:  Jay Holloway a 74 y.o.malewith medical history significant ofhypertension, hyperlipidemia, diabetes mellitus, prostate cancer status post radical prostatectomy in 2013, GERD, CKD stage III presents to emergency department with generalized weakness.  Patient tells me that he feels weak and not feeling well overall since 1-1/2 weeks. Reports that he slipped out of chair however denies any injuries or head trauma. Reports that his generalized weakness is getting worse and now that he cannot able to walk. Patient denies any change in speech (he tells me that his speech is always like that), headache, blurry vision, lightheadedness, dizziness, numbness, weakness, tingling sensation in hands or feet, facial drop, nausea, vomiting, diarrhea, abdominal pain, chest pain, shortness of breath, palpitation, urinary or bowel changes.  He admitted on 8/24 with fall in DKA and discharged to SNF on 8/27.  He denies history of smoking, alcohol, street drug use. He has chronic bilateral lower extremity edema however denies worsening swelling, redness, orthopnea or PND.   Assessment & Plan:   Principal Problem:   Weakness Active Problems:   DM (diabetes mellitus) (HCC)   Prostate cancer ([place])   Hypertension   HLD (hyperlipidemia)   Acute on chronic kidney failure (HCC)   Hyperkalemia   GERD (gastroesophageal reflux disease)   Generalized weakness   Generalized weakness: -Likely secondary to dehydration and acidosis. -Continue IV fluids at increased rate. CT head is negative for acute findings. COVID-19 is neg. Chest x-ray is negative for pneumonia. Patient is afebrile with no leukocytosis -As per EDP: New onset of slurred speech however patient denies any changes in his speech. MRI was negative -Consult PT/OT. -On fall precautions  AKI on CKD stage  III: -Creatinine: 2.12>1.79>>1.4, GFR: 35>43>>49 -Likely secondary to dehydration. Continue IV fluids. Hold nephrotoxic medication.   Anion gap metabolic acidosis with mild DKA now resolved -Patient's blood sugar upon arrival239, bicarb: 19>15>14>15, anion gap of 24>19 -Pt initially not placed on insulin gtt by admitting team -Pt had mild DKA on admission which had imrpoved but repeat BMP at 4Pm showed worsening DKA -insilun gtt ordered but delayed initiation by tx team -will continue fluids  Hypercarbic rreasp failure: -pt became increasingly more lethartgci this AM, ABG shows resp acidosis, -BIPAP initiated   Type 2 diabetes mellitus: Well controlled. Last A1c 6.2%. -Ins gtt weaned off, GAP closed acidosis resolved Restarted diet and home meds  Hyperkalemia: Potassium 5.3 -Improved to 4.2 -Likely secondary to worsening kidney function -No EKG changes. Continue IV fluids  Hyperlipidemia: Continue statin  Hypertension: Stable -Hold home BP meds amlodipine for now. Monitor blood pressure closely.  GERD: Continue PPI  History of prostate cancer: Status post radical prostatectomy in 2013.  DVT prophylaxis: Heparin SQ  Code Status: DNR    Code Status Orders  (From admission, onward)         Start     Ordered   04/14/20 1838  Do not attempt resuscitation (DNR)  Continuous       Question Answer Comment  In the event of cardiac or respiratory ARREST Do not call a "code blue"   In the event of cardiac or respiratory ARREST Do not perform Intubation, CPR, defibrillation or ACLS   In the event of cardiac or respiratory ARREST Use medication by any route, position, wound care, and other measures to relive pain and suffering. May use oxygen, suction and manual treatment of airway obstruction as needed  for comfort.      04/14/20 1839        Code Status History    Date Active Date Inactive Code Status Order ID Comments User Context   04/14/2020 1836 04/14/2020  1839 DNR [id number]  Mckinley Jewel, MD ED   03/21/2020 1559 03/25/2020 0542 Full Code [id number]  Karmen Bongo, MD ED   11/05/2014 1947 11/07/2014 1106 Full Code [id number]  Crecencio Mc, MD Inpatient   11/05/2014 1942 11/05/2014 1947 Full Code [id number]  Crecencio Mc, MD ED   Advance Care Planning Activity     Family Communication: None today Disposition Plan:   Pt will remain inpt requiring inpt resp care/support to include bipap for hypercarbic resp failure Consults called: None Admission status: Inpatient   Consultants:   None  Procedures:  CT HEAD WO CONTRAST  Result Date: 04/14/2020 CLINICAL DATA:  Altered mental status. Generalized weakness for 1.5 weeks. Slurred speech. Slipped from chair. EXAM: CT HEAD WITHOUT CONTRAST TECHNIQUE: Contiguous axial images were obtained from the base of the skull through the vertex without intravenous contrast. COMPARISON:  None. FINDINGS: Brain: No evidence of parenchymal hemorrhage or extra-axial fluid collection. No mass lesion, mass effect, or midline shift. No CT evidence of acute infarction. Nonspecific mild subcortical and periventricular white matter hypodensity, most in keeping with chronic small vessel ischemic change. Cerebral volume is age appropriate. No ventriculomegaly. Vascular: No acute abnormality. Skull: No evidence of calvarial fracture. Sinuses/Orbits: The visualized paranasal sinuses are essentially clear. Other:  The mastoid air cells are unopacified. IMPRESSION: 1. No evidence of acute intracranial abnormality. No evidence of calvarial fracture. 2. Mild chronic small vessel ischemic changes in the cerebral white matter. Electronically Signed   By: Ilona Sorrel M.D.   On: 04/14/2020 17:56   MR BRAIN WO CONTRAST  Result Date: 04/14/2020 CLINICAL DATA:  74 year old male with altered mental status, weakness. EXAM: MRI HEAD WITHOUT CONTRAST TECHNIQUE: Multiplanar, multiecho pulse sequences of the brain and surrounding structures were  obtained without intravenous contrast. COMPARISON:  Head CT earlier today. FINDINGS: Brain: No restricted diffusion to suggest acute infarction. No midline shift, mass effect, evidence of mass lesion, ventriculomegaly, extra-axial collection or acute intracranial hemorrhage. Cervicomedullary junction and pituitary are within normal limits. Pearline Cables and white matter signal is within normal limits for age throughout the brain. No cortical encephalomalacia or chronic cerebral blood products identified. Vascular: Major intracranial vascular flow voids are preserved. 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 Disc degeneration elsewhere in the visible cervical spine. Visualized bone marrow signal is within normal limits. Sinuses/Orbits: Negative. Other: Mastoids are clear. Grossly normal visible internal auditory structures. Visible scalp and face appear negative. IMPRESSION: 1. No acute intracranial abnormality. Essentially normal for age noncontrast MRI appearance of the brain. 2. 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 Electronically Signed   By: Genevie Ann M.D.   On: 04/14/2020 22:12   DG Pelvis Portable  Result Date: 03/21/2020 CLINICAL DATA:  Pelvic pain after fall EXAM: PORTABLE PELVIS 1-2 VIEWS COMPARISON:  11/10/2014 FINDINGS: There is no evidence of pelvic fracture or diastasis. Moderate arthropathy of the bilateral hips. Degenerative changes within the visualized lower lumbar spine with syndesmophytes and interspinous ligament ossification suggesting ankylosing spondylitis. IMPRESSION: 1. No acute osseous abnormality. 2. 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 Electronically Signed   By: Davina Poke D.O.   On: 03/21/2020  12:28   DG Chest Port 1 View  Result Date: 04/14/2020 CLINICAL DATA:  Weakness. EXAM: PORTABLE CHEST  1 VIEW COMPARISON:  03/21/2020 FINDINGS: Cardiac silhouette is normal in size. No mediastinal or hilar masses. Linear opacities noted at the right lung base consistent with scarring or atelectasis, stable. Lungs otherwise clear. No pleural effusion or pneumothorax. Skeletal structures are grossly intact. IMPRESSION: No active disease. Electronically Signed   By: Lajean Manes M.D.   On: 04/14/2020 15:01   DG Chest Port 1 View  Result Date: 03/21/2020 CLINICAL DATA:  Weakness, fall EXAM: PORTABLE CHEST 1 VIEW COMPARISON:  12/24/2019 FINDINGS: The heart size and mediastinal contours are within normal limits. Chronic elevation of the right hemidiaphragm with associated right basilar scarring. No new focal airspace consolidation. No pleural effusion or pneumothorax. Degenerative changes of the shoulders, left worse than right. The visualized skeletal structures are unremarkable. IMPRESSION: 1. No active cardiopulmonary disease. 2. Chronic elevation of the right hemidiaphragm with associated right basilar scarring. Electronically Signed   By: Davina Poke D.O.   On: 03/21/2020 12:35   DG Swallowing Func-Speech Pathology  Result Date: 03/22/2020 Objective Swallowing Evaluation: Type of Study: MBS-Modified Barium Swallow Study  Patient Details Name: Jay Holloway MRN: [id number] Date of Birth: 10/22/1945 Today's Date: 03/22/2020 Time: SLP Start Time (ACUTE ONLY): 1740 -SLP Stop Time (ACUTE ONLY): 1425 SLP Time Calculation (min) (ACUTE ONLY): 20 min Past Medical History: Past Medical History: Diagnosis Date . Arthritis   trouble turning head side to side and up . Diabetes mellitus without complication ([place])  . Hemorrhoids  . High cholesterol  . Hypertension  . Prostate cancer [facility])  Past Surgical History: Past Surgical History: Procedure Laterality Date . CHOLECYSTECTOMY N/A 11/11/2014  Procedure: LAPAROSCOPIC CHOLECYSTECTOMY WITH INTRAOPERATIVE CHOLANGIOGRAM;  Surgeon: Erroll Luna, MD;  Location: [facility];  Service:  General;  Laterality: N/A; . COLONOSCOPY N/A 11/09/2014  Procedure: COLONOSCOPY;  Surgeon: Juanita Craver, MD;  Location: [facility];  Service: Endoscopy;  Laterality: N/A; . CYSTOSCOPY WITH RETROGRADE PYELOGRAM, URETEROSCOPY AND STENT PLACEMENT Left 04/18/2014  Procedure: CYSTOSCOPY WITH RETROGRADE PYELOGRAM, URETEROSCOPY AND STENT PLACEMENT;  Surgeon: Raynelle Bring, MD;  Location: [facility];  Service: Urology;  Laterality: Left; . HOLMIUM LASER APPLICATION Left 03/11/4817  Procedure: HOLMIUM LASER APPLICATION;  Surgeon: Raynelle Bring, MD;  Location: [facility];  Service: Urology;  Laterality: Left; . LYMPHADENECTOMY  06/04/2012  Procedure: LYMPHADENECTOMY;  Surgeon: Dutch Gray, MD;  Location: [facility];  Service: Urology;  Laterality: Bilateral; . right  achilles tendon repair  yrs ago . ROBOT ASSISTED LAPAROSCOPIC RADICAL PROSTATECTOMY  06/04/2012  Procedure: ROBOTIC ASSISTED LAPAROSCOPIC RADICAL PROSTATECTOMY LEVEL 3;  Surgeon: Dutch Gray, MD;  Location: [facility];  Service: Urology;  Laterality: N/A; HPI: Pt is a 74 y/o male admitted secondary to fall from bed. Also found to be dehydrated. PMH includes HTN, prostate cancer, DM.  Subjective: pleasant, oriented Assessment / Plan / Recommendation CHL IP CLINICAL IMPRESSIONS 03/22/2020 Clinical Impression Patient presents with a mild-mod oral and a moderate pharyngeal dysphagia. During oral phase, patient exhibited delays in mastication and oral manipulation and anterior to posterior propulsion of regular solids, puree solids and minimally with thin liquids. Pharyngeal phase was characterized by swallow initiation delays to vallecular sinus, majority of regular solids and puree solids bolus remaining in vallecular sinus post initial swallow. Vallecular residual did not transit with second cued swallow but with thin liquids sip, approximately 70% of vallecular residuals transited out. One instance of penetration with full clearance of penetrate was  observed with thin liquids sips and  occured during the swallow. Patient exhibited decreased hyoid-laryngeal movement, decreased base of tongue strength as well as osteophytes which were pushing into pharynx and causing a structural narrowing of pharyngeal space. SLP Visit Diagnosis Dysphagia, oropharyngeal phase (R13.12) Attention and concentration deficit following -- Frontal lobe and executive function deficit following -- Impact on safety and function Mild aspiration risk   CHL IP TREATMENT RECOMMENDATION 03/22/2020 Treatment Recommendations Therapy as outlined in treatment plan below   Prognosis 03/22/2020 Prognosis for Safe Diet Advancement Good Barriers to Reach Goals -- Barriers/Prognosis Comment -- CHL IP DIET RECOMMENDATION 03/22/2020 SLP Diet Recommendations Dysphagia 1 (Puree) solids;Thin liquid Liquid Administration via Cup;Straw Medication Administration Crushed with puree Compensations Minimize environmental distractions;Small sips/bites;Slow rate;Follow solids with liquid;Multiple dry swallows after each bite/sip Postural Changes Remain semi-upright after after feeds/meals (Comment);Seated upright at 90 degrees   CHL IP OTHER RECOMMENDATIONS 03/22/2020 Recommended Consults -- Oral Care Recommendations Oral care BID Other Recommendations --   CHL IP FOLLOW UP RECOMMENDATIONS 03/22/2020 Follow up Recommendations Skilled Nursing facility   [facility] IP FREQUENCY AND DURATION 03/22/2020 Speech Therapy Frequency (ACUTE ONLY) min 2x/week Treatment Duration 1 week      CHL IP ORAL PHASE 03/22/2020 Oral Phase Impaired Oral - Pudding Teaspoon -- Oral - Pudding Cup -- Oral - Honey Teaspoon -- Oral - Honey Cup -- Oral - Nectar Teaspoon -- Oral - Nectar Cup -- Oral - Nectar Straw -- Oral - Thin Teaspoon -- Oral - Thin Cup Reduced posterior propulsion;Delayed oral transit;Weak lingual manipulation;Premature spillage Oral - Thin Straw Weak lingual manipulation;Delayed oral transit;Reduced posterior propulsion;Premature spillage Oral - Puree Weak lingual  manipulation;Delayed oral transit;Reduced posterior propulsion Oral - Mech Soft -- Oral - Regular Reduced posterior propulsion;Weak lingual manipulation;Impaired mastication;Delayed oral transit Oral - Multi-Consistency -- Oral - Pill Delayed oral transit;Reduced posterior propulsion;Weak lingual manipulation Oral Phase - Comment --  CHL IP PHARYNGEAL PHASE 03/22/2020 Pharyngeal Phase Impaired Pharyngeal- Pudding Teaspoon -- Pharyngeal -- Pharyngeal- Pudding Cup -- Pharyngeal -- Pharyngeal- Honey Teaspoon -- Pharyngeal -- Pharyngeal- Honey Cup -- Pharyngeal -- Pharyngeal- Nectar Teaspoon -- Pharyngeal -- Pharyngeal- Nectar Cup -- Pharyngeal -- Pharyngeal- Nectar Straw -- Pharyngeal -- Pharyngeal- Thin Teaspoon -- Pharyngeal -- Pharyngeal- Thin Cup Delayed swallow initiation-vallecula;Penetration/Aspiration during swallow;Pharyngeal residue - valleculae Pharyngeal Material enters airway, remains ABOVE vocal cords then ejected out Pharyngeal- Thin Straw Delayed swallow initiation-vallecula;Pharyngeal residue - valleculae Pharyngeal -- Pharyngeal- Puree Delayed swallow initiation-vallecula;Reduced laryngeal elevation;Reduced tongue base retraction;Pharyngeal residue - valleculae Pharyngeal -- Pharyngeal- Mechanical Soft -- Pharyngeal -- Pharyngeal- Regular Delayed swallow initiation-vallecula;Pharyngeal residue - valleculae Pharyngeal -- Pharyngeal- Multi-consistency -- Pharyngeal -- Pharyngeal- Pill Delayed swallow initiation-vallecula;Pharyngeal residue - valleculae Pharyngeal -- Pharyngeal Comment --  CHL IP CERVICAL ESOPHAGEAL PHASE 03/22/2020 Cervical Esophageal Phase WFL Pudding Teaspoon -- Pudding Cup -- Honey Teaspoon -- Honey Cup -- Nectar Teaspoon -- Nectar Cup -- Nectar Straw -- Thin Teaspoon -- Thin Cup -- Thin Straw -- Puree -- Mechanical Soft -- Regular -- Multi-consistency -- Pill -- Cervical Esophageal Comment -- Sonia Baller, MA, CCC-SLP Speech Therapy [facility] Acute Rehab Pager: [phone]                 Subjective: Pt became more lethargic THIS AFTERNOON and was found to be in resp failure as above, palced on BIPAP    Objective: Vitals:   04/16/20 0408 04/16/20 0731 04/16/20 1134 04/16/20 1236  BP:  124/73 119/74 126/73  Pulse:  (!) 110 (!) 109   Resp:  18  16 18  Temp:  98.2 F (36.8 C) 97.6 F (36.4 C) 97.7 F (36.5 C)  TempSrc:  Oral Oral   SpO2:  100% 97% 100%  Weight: 94.1 kg     Height:        Intake/Output Summary (Last 24 hours) at 04/16/2020 1319 Last data filed at 04/16/2020 1153 Gross per 24 hour  Intake 2934.57 ml  Output 2330 ml  Net 604.57 ml   Filed Weights   04/14/20 1410 04/15/20 0500 04/16/20 0408  Weight: 86.2 kg 92.8 kg 94.1 kg    Examination:  Constitutional:Awake answered questions, baseline slurred speech "normal", Eyes:PERRL, lids and conjunctivae normal ENMT:Mucous membranes normal.  Neck:normal, supple, no masses, no thyromegaly Respiratory:mild rhonchi, no wheezing, nl rate  Cardiovascular: Sinus tachycardia, low 100s, no murmur noted Abdomen:no tenderness, no masses palpated. No hepatosplenomegaly. Bowel sounds positive.  Musculoskeletal: No joint deformity upper and lower extremities. Good ROM, no contractures. Normal muscle tone.  Skin:no rashes, lesions, ulcers. No induration Neurologic:CN 2-12 grossly intact. Sensation intact, DTR normal.   Psychiatric:Flat affect but no acute decomp.     Data Reviewed: I have personally reviewed following labs and imaging studies  CBC: Recent Labs  Lab 04/14/20 1600 04/14/20 1617 04/15/20 0154 04/16/20 0213  WBC 9.7  --  10.0 7.9  NEUTROABS 6.4  --   --  4.8  HGB 12.4* 14.6 12.6* 10.9*  HCT 42.9 43.0 43.0 35.9*  MCV 97.1  --  95.8 95.7  PLT 282  --  224 400   Basic Metabolic Panel: Recent Labs  Lab 04/14/20 1814 04/14/20 1838 04/14/20 2245 04/15/20 1014 04/15/20 1539 04/15/20 1944 04/16/20 0213 04/16/20 0643  NA  --   --    < > 146* 146* 150* 148* 148*  K   --   --    < > 4.4 4.5 4.3 5.2* 4.2  CL  --   --    < > 112* 112* 114* 113* 111  CO2  --   --    < > 15* 13* 20* 22 26  GLUCOSE  --   --    < > 171* 148* 153* 148* 139*  BUN  --   --    < > 14 14 13 12 11   CREATININE  --   --    < > 1.72* 1.75* 1.74* 1.50* 1.40*  CALCIUM  --   --    < > 9.1 8.9 8.9 8.8* 9.0  MG 2.1  --   --   --   --   --   --   --   PHOS  --  4.9*  --   --   --   --   --   --    < > = values in this interval not displayed.   GFR: Estimated Creatinine Clearance: 56 mL/min (A) (by C-G formula based on SCr of 1.4 mg/dL (H)). Liver Function Tests: Recent Labs  Lab 04/14/20 1600 04/15/20 0154  AST 20 12*  ALT 21 20  ALKPHOS 75 66  BILITOT 2.0* 1.8*  PROT 6.7 6.1*  ALBUMIN 3.7 3.4*   No results for input(s): LIPASE, AMYLASE in the last 168 hours. No results for input(s): AMMONIA in the last 168 hours. Coagulation Profile: Recent Labs  Lab 04/14/20 1649  INR 1.0   Cardiac Enzymes: No results for input(s): CKTOTAL, CKMB, CKMBINDEX, TROPONINI in the last 168 hours. BNP (last 3 results) No results for input(s): PROBNP in the last 8760  hours. HbA1C: No results for input(s): HGBA1C in the last 72 hours. CBG: Recent Labs  Lab 04/16/20 0630 04/16/20 0729 04/16/20 0842 04/16/20 1031 04/16/20 1134  GLUCAP 168* 132* 131* 119* 141*   Lipid Profile: No results for input(s): CHOL, HDL, LDLCALC, TRIG, CHOLHDL, LDLDIRECT in the last 72 hours. Thyroid Function Tests: Recent Labs    04/14/20 2245  TSH 0.668   Anemia Panel: No results for input(s): VITAMINB12, FOLATE, FERRITIN, TIBC, IRON, RETICCTPCT in the last 72 hours. Sepsis Labs: Recent Labs  Lab 04/14/20 2245  LATICACIDVEN 1.0    Recent Results (from the past 240 hour(s))  SARS Coronavirus 2 by RT PCR (hospital order, performed in [facility] hospital lab) Nasopharyngeal Nasopharyngeal Swab     Status: None   Collection Time: 04/14/20  4:56 PM   Specimen: Nasopharyngeal Swab  Result Value Ref Range  Status   SARS Coronavirus 2 NEGATIVE NEGATIVE Final    Comment: (NOTE) SARS-CoV-2 target nucleic acids are NOT DETECTED.  The SARS-CoV-2 RNA is generally detectable in upper and lower respiratory specimens during the acute phase of infection. The lowest concentration of SARS-CoV-2 viral copies this assay can detect is 250 copies / mL. A negative result does not preclude SARS-CoV-2 infection and should not be used as the sole basis for treatment or other patient management decisions.  A negative result may occur with improper specimen collection / handling, submission of specimen other than nasopharyngeal swab, presence of viral mutation(s) within the areas targeted by this assay, and inadequate number of viral copies (<250 copies / mL). A negative result must be combined with clinical observations, patient history, and epidemiological information.  Fact Sheet for Patients:   StrictlyIdeas.no  Fact Sheet for Healthcare Providers: BankingDealers.co.za  This test is not yet approved or  cleared by the [place] FDA and has been authorized for detection and/or diagnosis of SARS-CoV-2 by FDA under an Emergency Use Authorization (EUA).  This EUA will remain in effect (meaning this test can be used) for the duration of the COVID-19 declaration under Section 564(b)(1) of the Act, 21 U.S.C. section 360bbb-3(b)(1), unless the authorization is terminated or revoked sooner.  Performed at [facility], [place] [address], [place], [place] [address]          Radiology Studies: CT HEAD WO CONTRAST  Result Date: 04/14/2020 CLINICAL DATA:  Altered mental status. Generalized weakness for 1.5 weeks. Slurred speech. Slipped from chair. EXAM: CT HEAD WITHOUT CONTRAST TECHNIQUE: Contiguous axial images were obtained from the base of the skull through the vertex without intravenous contrast. COMPARISON:  None. FINDINGS: Brain: No evidence of  parenchymal hemorrhage or extra-axial fluid collection. No mass lesion, mass effect, or midline shift. No CT evidence of acute infarction. Nonspecific mild subcortical and periventricular white matter hypodensity, most in keeping with chronic small vessel ischemic change. Cerebral volume is age appropriate. No ventriculomegaly. Vascular: No acute abnormality. Skull: No evidence of calvarial fracture. Sinuses/Orbits: The visualized paranasal sinuses are essentially clear. Other:  The mastoid air cells are unopacified. IMPRESSION: 1. No evidence of acute intracranial abnormality. No evidence of calvarial fracture. 2. Mild chronic small vessel ischemic changes in the cerebral white matter. Electronically Signed   By: Ilona Sorrel M.D.   On: 04/14/2020 17:56   MR BRAIN WO CONTRAST  Result Date: 04/14/2020 CLINICAL DATA:  74 year old male with altered mental status, weakness. EXAM: MRI HEAD WITHOUT CONTRAST TECHNIQUE: Multiplanar, multiecho pulse sequences of the brain and surrounding structures were obtained without intravenous contrast.  COMPARISON:  Head CT earlier today. FINDINGS: Brain: No restricted diffusion to suggest acute infarction. No midline shift, mass effect, evidence of mass lesion, ventriculomegaly, extra-axial collection or acute intracranial hemorrhage. Cervicomedullary junction and pituitary are within normal limits. Pearline Cables and white matter signal is within normal limits for age throughout the brain. No cortical encephalomalacia or chronic cerebral blood products identified. Vascular: Major intracranial vascular flow voids are preserved. 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 Disc degeneration elsewhere in the visible cervical spine. Visualized bone marrow signal is within normal limits. Sinuses/Orbits: Negative. Other: Mastoids are clear. Grossly normal visible internal auditory  structures. Visible scalp and face appear negative. IMPRESSION: 1. No acute intracranial abnormality. Essentially normal for age noncontrast MRI appearance of the brain. 2. 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 Electronically Signed   By: Genevie Ann M.D.   On: 04/14/2020 22:12   DG Chest Port 1 View  Result Date: 04/14/2020 CLINICAL DATA:  Weakness. EXAM: PORTABLE CHEST 1 VIEW COMPARISON:  03/21/2020 FINDINGS: Cardiac silhouette is normal in size. No mediastinal or hilar masses. Linear opacities noted at the right lung base consistent with scarring or atelectasis, stable. Lungs otherwise clear. No pleural effusion or pneumothorax. Skeletal structures are grossly intact. IMPRESSION: No active disease. Electronically Signed   By: Lajean Manes M.D.   On: 04/14/2020 15:01        Scheduled Meds: . heparin  5,000 Units Subcutaneous Q8H  . insulin aspart  0-15 Units Subcutaneous TID WC  . insulin aspart  0-5 Units Subcutaneous QHS  . insulin detemir  5 Units Subcutaneous Q24H  . sodium chloride flush  3 mL Intravenous Once   Continuous Infusions: . insulin 0.4 Units/hr (04/16/20 1033)  . lactated ringers 75 mL/hr at 04/16/20 1155     LOS: 2 days    Time spent: 35 min    Nicolette Bang, MD [facility]  If 7PM-7AM, please contact night-coverage  04/16/2020, 1:19 PM

## 2020-04-16 NOTE — TOC Initial Note (Signed)
Transition of Care [facility]) - Initial/Assessment Note    Patient Details  Name: Jay Holloway MRN: [id number] Date of Birth: 1946-07-07  Transition of Care [place] [facility]) CM/SW Contact:    Varney Baas Phone Number: 04/16/2020, 11:54 AM  Clinical Narrative:                 CSW spoke with patient's family friend Holley Bouche regarding SNF recommendation. Arbie Cookey stated patient lives alone and she helps him with things sometimes. She stated patient will need SNF at discharge. Patient went to a [place] SNF for about a week in August. Patient was supposed to received [facility] through [facility] following previous SNF placement. Arbie Cookey stated they reached out to patient, but never made a visit. Permission provided to fax referrals to [facility].     Expected Discharge Plan: Skilled Nursing Facility Barriers to Discharge: Continued Medical Work up   Patient Goals and CMS Choice   CMS Medicare.gov Compare Post Acute Care list provided to:: Patient Represenative (must comment) Holley Bouche) Choice offered to / list presented to :  Holley Bouche)  Expected Discharge Plan and Services Expected Discharge Plan: [place] In-house Referral: Clinical Social Work     Living arrangements for the past 2 months: Single Family Home                                      Prior Living Arrangements/Services Living arrangements for the past 2 months: Single Family Home Lives with:: Self Patient language and need for interpreter reviewed:: Yes Do you feel safe going back to the place where you live?: Yes      Need for Family Participation in Patient Care: Yes (Comment) Care giver support system in place?: Yes (comment)   Criminal Activity/Legal Involvement Pertinent to Current Situation/Hospitalization: No - Comment as needed  Activities of Daily Living Home Assistive Devices/Equipment: None ADL Screening (condition at time of admission) Is the patient deaf or have  difficulty hearing?: No Does the patient have difficulty seeing, even when wearing glasses/contacts?: No Does the patient have difficulty concentrating, remembering, or making decisions?: Yes Does the patient have difficulty dressing or bathing?: Yes Does the patient have difficulty walking or climbing stairs?: Yes  Permission Sought/Granted      Share Information with NAME: Holley Bouche  Permission granted to share info w AGENCY: SNFs  Permission granted to share info w Relationship: Friend  Permission granted to share info w Contact Information: [phone]  Emotional Assessment   Attitude/Demeanor/Rapport: Unable to Assess Affect (typically observed): Unable to Assess Orientation: : Oriented to Self, Oriented to Place, Oriented to  Time, Oriented to Situation Alcohol / Substance Use: Not Applicable Psych Involvement: No (comment)  Admission diagnosis:  Dehydration [E86.0] Slurred speech [C12.75] Metabolic acidosis [T70.0] Weakness [R53.1] Generalized weakness [R53.1] AKI (acute kidney injury) ([place]) [N17.9] Patient Active Problem List   Diagnosis Date Noted  . Hyperkalemia 04/14/2020  . GERD (gastroesophageal reflux disease) 04/14/2020  . Slurred speech 04/14/2020  . Generalized weakness 04/14/2020  . Acute on chronic kidney failure ([place]) 03/21/2020  . Prostate cancer ([place])   . Hypertension   . HLD (hyperlipidemia)   . Sepsis ([place]) 11/08/2014  . Arterial hypotension   . Renal failure   . DM (diabetes mellitus) ([place])   . Weakness   . Bacteremia   . Blood poisoning   . Urinary tract infectious disease   . DKA (diabetic  ketoacidoses) ([place]) 11/05/2014   PCP:  [name], MD Pharmacy:   [facility] [place], [place] - [address] [place] [address] Phone: [phone] Fax: [phone]  [place] [address] [place]), [place] - [address] [place] [address] Phone: [phone] Fax: [phone]     Social Determinants of Health (SDOH) Interventions    Readmission Risk Interventions No flowsheet data found.

## 2020-04-16 NOTE — Plan of Care (Signed)
Problem: Health Behavior/Discharge Planning: Goal: Ability to manage health-related needs will improve 04/16/2020 0302 by Lennox Grumbles, RN Outcome: Progressing 04/16/2020 0301 by Lennox Grumbles, RN Outcome: Progressing 04/16/2020 0258 by Lennox Grumbles, RN Outcome: Progressing   Problem: Clinical Measurements: Goal: Ability to maintain clinical measurements within normal limits will improve 04/16/2020 0302 by Lennox Grumbles, RN Outcome: Progressing 04/16/2020 0301 by Lennox Grumbles, RN Outcome: Progressing 04/16/2020 0258 by Lennox Grumbles, RN Outcome: Progressing Goal: Will remain free from infection 04/16/2020 0302 by Lennox Grumbles, RN Outcome: Progressing 04/16/2020 0301 by Lennox Grumbles, RN Outcome: Progressing 04/16/2020 0258 by Lennox Grumbles, RN Outcome: Progressing Goal: Diagnostic test results will improve 04/16/2020 0302 by Lennox Grumbles, RN Outcome: Progressing 04/16/2020 0301 by Lennox Grumbles, RN Outcome: Progressing 04/16/2020 0258 by Lennox Grumbles, RN Outcome: Progressing Goal: Respiratory complications will improve 04/16/2020 0302 by Lennox Grumbles, RN Outcome: Progressing 04/16/2020 0301 by Lennox Grumbles, RN Outcome: Progressing 04/16/2020 0258 by Lennox Grumbles, RN Outcome: Progressing Goal: Cardiovascular complication will be avoided 04/16/2020 0302 by Lennox Grumbles, RN Outcome: Progressing 04/16/2020 0301 by Lennox Grumbles, RN Outcome: Progressing 04/16/2020 0258 by Lennox Grumbles, RN Outcome: Progressing   Problem: Education: Goal: Knowledge of General Education information will improve Description: Including pain rating scale, medication(s)/side effects and non-pharmacologic comfort measures 04/16/2020 0302 by Lennox Grumbles, RN Outcome: Progressing 04/16/2020 0301 by Lennox Grumbles, RN Outcome: Progressing 04/16/2020 0258 by Lennox Grumbles, RN Outcome: Progressing   Problem: Activity: Goal: Risk for activity intolerance will decrease 04/16/2020  0302 by Lennox Grumbles, RN Outcome: Progressing 04/16/2020 0301 by Lennox Grumbles, RN Outcome: Progressing 04/16/2020 0258 by Lennox Grumbles, RN Outcome: Progressing   Problem: Nutrition: Goal: Adequate nutrition will be maintained 04/16/2020 0302 by Lennox Grumbles, RN Outcome: Progressing 04/16/2020 0301 by Lennox Grumbles, RN Outcome: Progressing 04/16/2020 0258 by Lennox Grumbles, RN Outcome: Progressing   Problem: Coping: Goal: Level of anxiety will decrease 04/16/2020 0302 by Lennox Grumbles, RN Outcome: Progressing 04/16/2020 0301 by Lennox Grumbles, RN Outcome: Progressing 04/16/2020 0258 by Lennox Grumbles, RN Outcome: Progressing   Problem: Elimination: Goal: Will not experience complications related to bowel motility 04/16/2020 0302 by Lennox Grumbles, RN Outcome: Progressing 04/16/2020 0301 by Lennox Grumbles, RN Outcome: Progressing 04/16/2020 0258 by Lennox Grumbles, RN Outcome: Progressing Goal: Will not experience complications related to urinary retention 04/16/2020 0302 by Lennox Grumbles, RN Outcome: Progressing 04/16/2020 0301 by Lennox Grumbles, RN Outcome: Progressing 04/16/2020 0258 by Lennox Grumbles, RN Outcome: Progressing   Problem: Pain Managment: Goal: General experience of comfort will improve 04/16/2020 0302 by Lennox Grumbles, RN Outcome: Progressing 04/16/2020 0301 by Lennox Grumbles, RN Outcome: Progressing 04/16/2020 0258 by Lennox Grumbles, RN Outcome: Progressing   Problem: Safety: Goal: Ability to remain free from injury will improve 04/16/2020 0302 by Lennox Grumbles, RN Outcome: Progressing 04/16/2020 0301 by Lennox Grumbles, RN Outcome: Progressing 04/16/2020 0258 by Lennox Grumbles, RN Outcome: Progressing   Problem: Skin Integrity: Goal: Risk for impaired skin integrity will decrease 04/16/2020 0302 by Lennox Grumbles, RN Outcome: Progressing 04/16/2020 0301 by Lennox Grumbles, RN Outcome: Progressing 04/16/2020 0258 by Lennox Grumbles, RN Outcome:  Progressing   Problem: Education: Goal: Ability to describe self-care measures that may prevent or decrease complications (Diabetes Survival Skills Education) will improve 04/16/2020 0302 by Lennox Grumbles,  RN Outcome: Progressing 04/16/2020 0301 by Lennox Grumbles, RN Outcome: Progressing Goal: Individualized Educational Video(s) 04/16/2020 0302 by Lennox Grumbles, RN Outcome: Progressing 04/16/2020 0301 by Lennox Grumbles, RN Outcome: Progressing   Problem: Coping: Goal: Ability to adjust to condition or change in health will improve 04/16/2020 0302 by Lennox Grumbles, RN Outcome: Progressing 04/16/2020 0301 by Lennox Grumbles, RN Outcome: Progressing   Problem: Fluid Volume: Goal: Ability to maintain a balanced intake and output will improve 04/16/2020 0302 by Lennox Grumbles, RN Outcome: Progressing 04/16/2020 0301 by Lennox Grumbles, RN Outcome: Progressing   Problem: Health Behavior/Discharge Planning: Goal: Ability to identify and utilize available resources and services will improve 04/16/2020 0302 by Lennox Grumbles, RN Outcome: Progressing 04/16/2020 0301 by Lennox Grumbles, RN Outcome: Progressing Goal: Ability to manage health-related needs will improve 04/16/2020 0302 by Lennox Grumbles, RN Outcome: Progressing 04/16/2020 0301 by Lennox Grumbles, RN Outcome: Progressing

## 2020-04-16 NOTE — NC FL2 (Signed)
[place] MEDICAID FL2 LEVEL OF CARE SCREENING TOOL     IDENTIFICATION  Patient Name: Jay Holloway Birthdate: Apr 26, 1946 Sex: male Admission Date (Current Location): 04/14/2020  [facility] and [place] Number:  Herbalist and Address:  [facility], [place] [address] [place], [place] [address]      Provider Number: [id number]  Attending Physician Name and Address:  Marcell Anger*  Relative Name and Phone Number:  Holley Bouche    Current Level of Care: Hospital Recommended Level of Care: [place] Prior Approval Number:    Date Approved/Denied:   PASRR Number: [id number]  Discharge Plan: SNF    Current Diagnoses: Patient Active Problem List   Diagnosis Date Noted  . Hyperkalemia 04/14/2020  . GERD (gastroesophageal reflux disease) 04/14/2020  . Slurred speech 04/14/2020  . Generalized weakness 04/14/2020  . Acute on chronic kidney failure ([place]) 03/21/2020  . Prostate cancer ([place])   . Hypertension   . HLD (hyperlipidemia)   . Sepsis ([place]) 11/08/2014  . Arterial hypotension   . Renal failure   . DM (diabetes mellitus) ([place])   . Weakness   . Bacteremia   . Blood poisoning   . Urinary tract infectious disease   . DKA (diabetic ketoacidoses) ([place]) 11/05/2014    Orientation RESPIRATION BLADDER Height & Weight     Self, Time, Situation, Place  Normal Incontinent, External catheter Weight: 207 lb 7.3 oz (94.1 kg) Height:  6' (182.9 cm)  BEHAVIORAL SYMPTOMS/MOOD NEUROLOGICAL BOWEL NUTRITION STATUS      Continent Diet (See discharge summary)  AMBULATORY STATUS COMMUNICATION OF NEEDS Skin   Limited Assist Verbally Normal                       Personal Care Assistance Level of Assistance  Bathing, Feeding, Dressing Bathing Assistance: Limited assistance Feeding assistance: Independent Dressing Assistance: Limited assistance     Functional Limitations Info  Sight, Hearing, Speech Sight Info:  Impaired Hearing Info: Adequate Speech Info: Impaired (Difficulty speaking)    SPECIAL CARE FACTORS FREQUENCY  PT (By licensed PT), OT (By licensed OT)     PT Frequency: 5x a week OT Frequency: 5x a week            Contractures Contractures Info: Not present    Additional Factors Info  Code Status, Allergies Code Status Info: DNR Allergies Info: NKA           Current Medications (04/16/2020):  This is the current hospital active medication list Current Facility-Administered Medications  Medication Dose Route Frequency Provider Last Rate Last Admin  . acetaminophen (TYLENOL) tablet 650 mg  650 mg Oral Q6H PRN Pahwani, Rinka R, MD       Or  . acetaminophen (TYLENOL) suppository 650 mg  650 mg Rectal Q6H PRN Pahwani, Rinka R, MD      . dextrose 50 % solution 0-50 mL  0-50 mL Intravenous PRN Spongberg, Audie Pinto, MD      . heparin injection 5,000 Units  5,000 Units Subcutaneous Q8H Pahwani, Rinka R, MD   5,000 Units at 04/16/20 1321  . insulin aspart (novoLOG) injection 0-15 Units  0-15 Units Subcutaneous TID WC Marcell Anger, MD   2 Units at 04/16/20 1141  . insulin aspart (novoLOG) injection 0-5 Units  0-5 Units Subcutaneous QHS Spongberg, Audie Pinto, MD      . insulin detemir (LEVEMIR) injection 5 Units  5 Units Subcutaneous Q24H Spongberg, Audie Pinto, MD  5 Units at 04/16/20 0929  . insulin regular, human (MYXREDLIN) 100 units/ 100 mL infusion   Intravenous Continuous Marcell Anger, MD 0.4 mL/hr at 04/16/20 1033 0.4 Units/hr at 04/16/20 1033  . lactated ringers infusion   Intravenous Continuous Marcell Anger, MD 75 mL/hr at 04/16/20 1155 Rate Change at 04/16/20 1155  . ondansetron (ZOFRAN) tablet 4 mg  4 mg Oral Q6H PRN Pahwani, Rinka R, MD       Or  . ondansetron (ZOFRAN) injection 4 mg  4 mg Intravenous Q6H PRN Pahwani, Rinka R, MD      . sodium chloride flush (NS) 0.9 % injection 3 mL  3 mL Intravenous Once Fredia Sorrow, MD          Discharge Medications: Please see discharge summary for a list of discharge medications.  Relevant Imaging Results:  Relevant Lab Results:   Additional Information [id number]  Jay Holloway, [place]

## 2020-04-16 NOTE — Progress Notes (Signed)
   04/16/20 1330  BiPAP/CPAP/SIPAP  $ Non-Invasive Ventilator  Non-Invasive Vent Set Up;Non-Invasive Vent Initial  BiPAP/CPAP/SIPAP Pt Type Adult  Mask Type Full face mask  Mask Size Medium  Set Rate 12 breaths/min  Respiratory Rate 17 breaths/min  IPAP 18 cmH20  EPAP 8 cmH2O  Oxygen Percent 30 %  Minute Ventilation 8.1  Leak 23  Peak Inspiratory Pressure (PIP) 19  Tidal Volume (Vt) 474  BiPAP/CPAP/SIPAP BiPAP  Patient Home Equipment No  Auto Titrate No  Press High Alarm 25 cmH2O  Press Low Alarm 5 cmH2O  BiPAP/CPAP /SiPAP Vitals  Pulse Rate (!) 114  SpO2 100 %  MEWS Score/Color  MEWS Score 3  MEWS Score Color Yellow   Patient placed on BiPAP on above settings  due to ABG results. Patient tolerating well at this time, RT will continue to monitor.

## 2020-04-17 DIAGNOSIS — J9602 Acute respiratory failure with hypercapnia: Secondary | ICD-10-CM | POA: Diagnosis not present

## 2020-04-17 DIAGNOSIS — G9341 Metabolic encephalopathy: Secondary | ICD-10-CM | POA: Diagnosis present

## 2020-04-17 DIAGNOSIS — E1110 Type 2 diabetes mellitus with ketoacidosis without coma: Principal | ICD-10-CM

## 2020-04-17 DIAGNOSIS — N182 Chronic kidney disease, stage 2 (mild): Secondary | ICD-10-CM | POA: Diagnosis present

## 2020-04-17 DIAGNOSIS — J9601 Acute respiratory failure with hypoxia: Secondary | ICD-10-CM

## 2020-04-17 DIAGNOSIS — E870 Hyperosmolality and hypernatremia: Secondary | ICD-10-CM | POA: Diagnosis present

## 2020-04-17 DIAGNOSIS — R1310 Dysphagia, unspecified: Secondary | ICD-10-CM

## 2020-04-17 LAB — BLOOD GAS, ARTERIAL
Acid-Base Excess: 5.5 mmol/L — ABNORMAL HIGH (ref 0.0–2.0)
Bicarbonate: 31.6 mmol/L — ABNORMAL HIGH (ref 20.0–28.0)
FIO2: 36
O2 Saturation: 98.8 %
Patient temperature: 37
pCO2 arterial: 67.2 mmHg (ref 32.0–48.0)
pH, Arterial: 7.294 — ABNORMAL LOW (ref 7.350–7.450)
pO2, Arterial: 113 mmHg — ABNORMAL HIGH (ref 83.0–108.0)

## 2020-04-17 LAB — GLUCOSE, CAPILLARY
Glucose-Capillary: 125 mg/dL — ABNORMAL HIGH (ref 70–99)
Glucose-Capillary: 137 mg/dL — ABNORMAL HIGH (ref 70–99)
Glucose-Capillary: 149 mg/dL — ABNORMAL HIGH (ref 70–99)
Glucose-Capillary: 152 mg/dL — ABNORMAL HIGH (ref 70–99)
Glucose-Capillary: 156 mg/dL — ABNORMAL HIGH (ref 70–99)
Glucose-Capillary: 185 mg/dL — ABNORMAL HIGH (ref 70–99)

## 2020-04-17 LAB — BASIC METABOLIC PANEL
Anion gap: 8 (ref 5–15)
BUN: 7 mg/dL — ABNORMAL LOW (ref 8–23)
CO2: 29 mmol/L (ref 22–32)
Calcium: 9.3 mg/dL (ref 8.9–10.3)
Chloride: 115 mmol/L — ABNORMAL HIGH (ref 98–111)
Creatinine, Ser: 1.28 mg/dL — ABNORMAL HIGH (ref 0.61–1.24)
GFR calc Af Amer: >60 mL/min (ref >60)
GFR calc non Af Amer: 55 mL/min — ABNORMAL LOW (ref >60)
Glucose, Bld: 117 mg/dL — ABNORMAL HIGH (ref 70–99)
Potassium: 3.4 mmol/L — ABNORMAL LOW (ref 3.5–5.1)
Sodium: 152 mmol/L — ABNORMAL HIGH (ref 135–145)

## 2020-04-17 LAB — SARS CORONAVIRUS 2 (TAT 6-24 HRS): SARS Coronavirus 2: NEGATIVE

## 2020-04-17 MED ORDER — SODIUM CHLORIDE 0.45 % IV SOLN: INTRAVENOUS | Status: DC

## 2020-04-17 MED ORDER — COLLAGENASE 250 UNIT/GM EX OINT
TOPICAL_OINTMENT | Freq: Every day | CUTANEOUS | Status: DC
  Filled 2020-04-17: qty 30

## 2020-04-17 NOTE — Progress Notes (Signed)
[place] updated about pt's potassium 3.4 and now being on 6L o2 [place] d/t not wanting BiPAP mask on. Pt tolerating fairly well.

## 2020-04-17 NOTE — Progress Notes (Signed)
Spoke w/ Clarise Cruz RTT about pt taking BiPAP mask off. Pt more alert x2 than the beginning of this shift. Clarise Cruz suggest to place pt on 6L [place] and keep O2 100%

## 2020-04-17 NOTE — Progress Notes (Signed)
1836 - Provider Notified: Shela Leff, MD Patient increasingly confused. Removing condom catheter, IV lines and pulse oximetry. Attempted 2 IV's starts to no success, IV team consulted. IV fluids stopped.   0547 - Provider Notified: Shela Leff, MD Patient is confused and pulling out IV lines, Respiratory is obtaining an ABG, and Restraints have been Applied to bilateral wrists , Unable to contact family from both numbers available on face sheet ([phone]), will follow up in the AM.  [id number] - Provider Notified: Shela Leff, MD ABG has resulted   0648 -  CRITICAL VALUE ALERT  Critical Value: Potassium 2.7  Date & Time Notied:  0016/ 04/18/2020  Provider Notified: Shela Leff, MD  Orders Received/Actions taken: 33 Meq IV potassium over 6 doses

## 2020-04-17 NOTE — Progress Notes (Signed)
At 1600 assessment pt seemed to be more confused only alert to self and repeating the same words when asked where pt was and birth date. Also more drowsy as well. MD Lyman Speller paged.

## 2020-04-17 NOTE — Plan of Care (Signed)
  Problem: Education: Goal: Knowledge of General Education information will improve Description: Including pain rating scale, medication(s)/side effects and non-pharmacologic comfort measures Outcome: Progressing   Problem: Health Behavior/Discharge Planning: Goal: Ability to manage health-related needs will improve Outcome: Progressing   Problem: Clinical Measurements: Goal: Ability to maintain clinical measurements within normal limits will improve Outcome: Progressing Goal: Will remain free from infection Outcome: Progressing Goal: Diagnostic test results will improve Outcome: Progressing Goal: Respiratory complications will improve Outcome: Progressing Goal: Cardiovascular complication will be avoided Outcome: Progressing   Problem: Activity: Goal: Risk for activity intolerance will decrease Outcome: Progressing   Problem: Nutrition: Goal: Adequate nutrition will be maintained Outcome: Progressing   Problem: Coping: Goal: Level of anxiety will decrease Outcome: Progressing   Problem: Elimination: Goal: Will not experience complications related to bowel motility Outcome: Progressing Goal: Will not experience complications related to urinary retention Outcome: Progressing   Problem: Pain Managment: Goal: General experience of comfort will improve Outcome: Progressing   Problem: Safety: Goal: Ability to remain free from injury will improve Outcome: Progressing   Problem: Skin Integrity: Goal: Risk for impaired skin integrity will decrease Outcome: Progressing   Problem: Education: Goal: Ability to describe self-care measures that may prevent or decrease complications (Diabetes Survival Skills Education) will improve Outcome: Progressing Goal: Individualized Educational Video(s) Outcome: Progressing   Problem: Coping: Goal: Ability to adjust to condition or change in health will improve Outcome: Progressing   Problem: Fluid Volume: Goal: Ability to  maintain a balanced intake and output will improve Outcome: Progressing   Problem: Health Behavior/Discharge Planning: Goal: Ability to identify and utilize available resources and services will improve Outcome: Progressing Goal: Ability to manage health-related needs will improve Outcome: Progressing   

## 2020-04-17 NOTE — Progress Notes (Signed)
Pt not wanting BiPAP mask on d/t it 'being too tight", pt placed back on [place] 6L.

## 2020-04-17 NOTE — Progress Notes (Signed)
Pt to have STAT ABG drawn. Oncoming RN updated.

## 2020-04-17 NOTE — Progress Notes (Signed)
  Speech Language Pathology Treatment: Dysphagia  Patient Details Name: Jay Holloway MRN: [id number] DOB: May 02, 1946 Today's Date: 04/17/2020 Time: 9233-0076 SLP Time Calculation (min) (ACUTE ONLY): 17 min  Assessment / Plan / Recommendation Clinical Impression  Pt was seen for skilled ST targeting PO trials.  Per RN report, pt was changed to NPO secondary to being placed on BiPAP.  She stated that he would be able to resume a PO diet now that he is tolerating the nasal cannula.  Pt was encountered awake/alert in bed with IV pulled from his L hand.  RN was made aware.  Pt appeared to be mildly confused throughout this session, but he was fully oriented except for the year.  He consumed trials of thin liquid, nectar-thick liquid, puree, and regular solids.  Mastication of regular solids was prolonged and a delayed throat clear was observed following swallow initiation.  Multiple swallows were also observed with nectar-thick liquid, puree, and regular solids, likely indicative of pharyngeal residue given previous MBS results.  A delayed throat clear was also observed following 2/10 trials of thin liquid and 2/7 trials of nectar-thick liquid.  Pt benefited from verbal cues to take small sips of his drink.  No overt s/sx of aspiration were observed with puree trials.  Recommend initiation of Dysphagia 1 (puree) solids and thin liquids with intermittent supervision to cue for compensatory strategies (listed below).  SLP will f/u to monitor diet tolerance per POC.     HPI HPI: 74 y.o. male with medical history significant of hypertension, hyperlipidemia, diabetes mellitus, prostate cancer status post radical prostatectomy in 2013, GERD, CKD stage III presents to emergency department with generalized weakness      SLP Plan  Continue with current plan of care       Recommendations  Diet recommendations: Dysphagia 1 (puree);Thin liquid Liquids provided via: Cup;Straw Medication Administration: Crushed  with puree Supervision: Patient able to self feed Compensations: Minimize environmental distractions;Small sips/bites;Slow rate;Follow solids with liquid;Multiple dry swallows after each bite/sip Postural Changes and/or Swallow Maneuvers: Seated upright 90 degrees                Oral Care Recommendations: Oral care BID Follow up Recommendations: Skilled Nursing facility SLP Visit Diagnosis: Dysphagia, oropharyngeal phase (R13.12) Plan: Continue with current plan of care       [place]., M.S., [place] Office: [phone]  [place] 04/17/2020, 10:26 AM

## 2020-04-17 NOTE — TOC Progression Note (Addendum)
Transition of Care [facility]) - Progression Note    Patient Details  Name: Jay Holloway MRN: [id number] Date of Birth: Apr 20, 1946  [facility] CM/SW Contact  Coralee [place], [place] Phone Number: 04/17/2020, 10:13 AM  Clinical Narrative:    Addendum 04/17/2020 1:03 pm CSW spoke with pt's nephew who has chosen [employer]. Insurance is straight [place], facility will start Auth. Dr. Ree Kida and plans to discharge tomorrow or following day. Covid test ordered. Facility will call to answer any further questions from family. CSW will follow for discharge.    CSW provided bed offers to pt's nephew. Also received information from [facility] that they have a bed available today. Nephew will look at offers and follow up. CSW will follow for discharge.  Expected Discharge Plan: [place] Barriers to Discharge: Continued Medical Work up  Expected Discharge Plan and Services Expected Discharge Plan: [place] In-house Referral: Clinical Social Work     Living arrangements for the past 2 months: Single Family Home                                       Social Determinants of Health (SDOH) Interventions    Readmission Risk Interventions No flowsheet data found.

## 2020-04-17 NOTE — Progress Notes (Signed)
PROGRESS NOTE  Jay Holloway LZJ:[id number] DOB: [birth year] DOA: 04/14/2020 PCP: [name], MD  HPI/Recap of past 71 hours: 74 year old male with past medical history of hypertension, diabetes mellitus, and stage III chronic kidney disease presented to the emergency room on 9/17 with complaints of weakness.  He had a recent admission for DKA and was discharged to skilled nursing approximately 3 weeks prior.  In the emergency room, found to have mild DKA.  An A1c actually noted good control at 6.2.  Patient given fluids and insulin.  CBGs have remained stable since.  On 9/19, patient noted to be a bit more lethargic I & D ABG noted hypercarbia with mild respiratory failure.  Patient started on BiPAP.  He is slightly more awake today off of BiPAP.  His sodium over the past few days noted to be increased to 152.  Assessment/Plan: Principal Problem: DKA and patient with diabetes mellitus: Stable.  Resolved with fluids and insulin doses.  A1c notes good control at 6.2. Active Problems:   DM (diabetes mellitus) ([place])   Prostate cancer ([place])   Hypertension: Blood pressure stable.   HLD (hyperlipidemia)   Acute on chronic kidney failure [facility]): Patient has had significant improvement and back to baseline.  Creatinine today at 1.28.  Hypernatremia: Unclear etiology although I suspect that this may be more of a chronic process.  His sodium was falsely normal on admission because of your account for elevated blood sugars from his DKA his sodium actually corrects closer to 148.  Have started gentle IV fluids with half-normal saline.  Dysphagia: Seen by speech therapy who recommended dysphagia 1 diet although this is likely in part due to his somnolence likely from hypercarbia and hypernatremia.  Hopefully will improve as his mentation improves.    Hyperkalemia: Resolved, secondary to fluid shifts brought on by DKA.    GERD (gastroesophageal reflux disease)  Overweight: Patient is criteria BMI greater  than 25    Generalized weakness: Evaluated by physical therapy and will need skilled nursing.  Acute respiratory failure with hypercarbia: May be more of chronic CO2 retention secondary to somnolence brought on by hyponatremia.  Will recheck ABG and patient may need more BiPAP.  No previous history of COPD.  No history of CHF.  We will continue to evaluate.  Code Status: DNR  Family Communication: Left message for family  Disposition Plan: Skilled nursing once sodium improves and respiratory issues improved   Consultants:  None  Procedures:  None  Antimicrobials:  None  DVT prophylaxis: Lovenox   Objective: Vitals:   04/17/20 0720 04/17/20 1225  BP: 115/65 124/73  Pulse: (!) 107 (!) 110  Resp: 17 20  Temp: 99 F (37.2 C) 98 F (36.7 C)  SpO2: 100% 100%    Intake/Output Summary (Last 24 hours) at 04/17/2020 1238 Last data filed at 04/17/2020 0240 Gross per 24 hour  Intake 1381.88 ml  Output 1225 ml  Net 156.88 ml   Filed Weights   04/15/20 0500 04/16/20 0408 04/17/20 0320  Weight: 92.8 kg 94.1 kg 96.4 kg   Body mass index is 28.82 kg/m.  Exam:   General: A bit more awake, oriented x2.  When asked where he is, states, [facility].  However when asked other questions like what year it is, he continues to say [place]: Normocephalic, atraumatic, mucous membranes are slightly dry  Neck: Supple, no JVD  Cardiovascular: Regular rate and rhythm, borderline tachycardia  Respiratory: Decreased breath sounds throughout  Abdomen: Soft, nontender, nondistended, positive bowel sounds  Musculoskeletal: No clubbing or cyanosis or edema  Psychiatry: Somewhat appropriate although confused.  Neuro: No focal deficits   Data Reviewed: CBC: Recent Labs  Lab 04/14/20 1600 04/14/20 1617 04/15/20 0154 04/16/20 0213  WBC 9.7  --  10.0 7.9  NEUTROABS 6.4  --   --  4.8  HGB 12.4* 14.6 12.6* 10.9*  HCT 42.9 43.0 43.0 35.9*  MCV  97.1  --  95.8 95.7  PLT 282  --  224 144   Basic Metabolic Panel: Recent Labs  Lab 04/14/20 1814 04/14/20 1838 04/14/20 2245 04/15/20 1944 04/16/20 0213 04/16/20 0643 04/16/20 1454 04/17/20 0201  NA  --   --    < > 150* 148* 148* 152* 152*  K  --   --    < > 4.3 5.2* 4.2 3.9 3.4*  CL  --   --    < > 114* 113* 111 112* 115*  CO2  --   --    < > 20* 22 26 29 29   GLUCOSE  --   --    < > 153* 148* 139* 163* 117*  BUN  --   --    < > 13 12 11 9  7*  CREATININE  --   --    < > 1.74* 1.50* 1.40* 1.34* 1.28*  CALCIUM  --   --    < > 8.9 8.8* 9.0 9.0 9.3  MG 2.1  --   --   --   --   --   --   --   PHOS  --  4.9*  --   --   --   --   --   --    < > = values in this interval not displayed.   GFR: Estimated Creatinine Clearance: 61.9 mL/min (A) (by C-G formula based on SCr of 1.28 mg/dL (H)). Liver Function Tests: Recent Labs  Lab 04/14/20 1600 04/15/20 0154  AST 20 12*  ALT 21 20  ALKPHOS 75 66  BILITOT 2.0* 1.8*  PROT 6.7 6.1*  ALBUMIN 3.7 3.4*   No results for input(s): LIPASE, AMYLASE in the last 168 hours. No results for input(s): AMMONIA in the last 168 hours. Coagulation Profile: Recent Labs  Lab 04/14/20 1649  INR 1.0   Cardiac Enzymes: No results for input(s): CKTOTAL, CKMB, CKMBINDEX, TROPONINI in the last 168 hours. BNP (last 3 results) No results for input(s): PROBNP in the last 8760 hours. HbA1C: No results for input(s): HGBA1C in the last 72 hours. CBG: Recent Labs  Lab 04/16/20 2016 04/16/20 2341 04/17/20 0310 04/17/20 0856 04/17/20 1208  GLUCAP 165* 146* 125* 137* 152*   Lipid Profile: No results for input(s): CHOL, HDL, LDLCALC, TRIG, CHOLHDL, LDLDIRECT in the last 72 hours. Thyroid Function Tests: Recent Labs    04/14/20 2245  TSH 0.668   Anemia Panel: No results for input(s): VITAMINB12, FOLATE, FERRITIN, TIBC, IRON, RETICCTPCT in the last 72 hours. Urine analysis:    Component Value Date/Time   COLORURINE YELLOW 04/14/2020 [place] 04/14/2020 1743   LABSPEC 1.015 04/14/2020 1743   PHURINE 5.0 04/14/2020 1743   GLUCOSEU >=500 (A) 04/14/2020 1743   HGBUR NEGATIVE 04/14/2020 1743   BILIRUBINUR NEGATIVE 04/14/2020 1743   KETONESUR 80 (A) 04/14/2020 1743   PROTEINUR 30 (A) 04/14/2020 1743   UROBILINOGEN 0.2 11/05/2014 1712   NITRITE NEGATIVE 04/14/2020 1743   LEUKOCYTESUR NEGATIVE 04/14/2020 1743   Sepsis  Labs: @LABRCNTIP (procalcitonin:4,lacticidven:4)  ) Recent Results (from the past 240 hour(s))  SARS Coronavirus 2 by RT PCR (hospital order, performed in [facility] hospital lab) Nasopharyngeal Nasopharyngeal Swab     Status: None   Collection Time: 04/14/20  4:56 PM   Specimen: Nasopharyngeal Swab  Result Value Ref Range Status   SARS Coronavirus 2 NEGATIVE NEGATIVE Final    Comment: (NOTE) SARS-CoV-2 target nucleic acids are NOT DETECTED.  The SARS-CoV-2 RNA is generally detectable in upper and lower respiratory specimens during the acute phase of infection. The lowest concentration of SARS-CoV-2 viral copies this assay can detect is 250 copies / mL. A negative result does not preclude SARS-CoV-2 infection and should not be used as the sole basis for treatment or other patient management decisions.  A negative result may occur with improper specimen collection / handling, submission of specimen other than nasopharyngeal swab, presence of viral mutation(s) within the areas targeted by this assay, and inadequate number of viral copies (<250 copies / mL). A negative result must be combined with clinical observations, patient history, and epidemiological information.  Fact Sheet for Patients:   StrictlyIdeas.no  Fact Sheet for Healthcare Providers: BankingDealers.co.za  This test is not yet approved or  cleared by the [place] FDA and has been authorized for detection and/or diagnosis of SARS-CoV-2 by FDA under an Emergency Use  Authorization (EUA).  This EUA will remain in effect (meaning this test can be used) for the duration of the COVID-19 declaration under Section 564(b)(1) of the Act, 21 U.S.C. section 360bbb-3(b)(1), unless the authorization is terminated or revoked sooner.  Performed at [facility], [place] [address] [place], [place] [address]       Studies: No results found.  Scheduled Meds:  collagenase   Topical Daily   heparin  5,000 Units Subcutaneous Q8H   insulin aspart  0-9 Units Subcutaneous Q4H   insulin detemir  5 Units Subcutaneous Q24H   sodium chloride flush  3 mL Intravenous Once    Continuous Infusions:  sodium chloride 75 mL/hr at 04/17/20 1029     LOS: 3 days     Annita Brod, MD [facility]   04/17/2020, 12:38 PM

## 2020-04-17 NOTE — Progress Notes (Signed)
Bipap not needed at this time. RT will continue to monitor throughout night.

## 2020-04-17 NOTE — Progress Notes (Signed)
Pt's potassium 3.4. paging system down and not sure which MD is covering tonight. Will conti to monitor pt and attempt to page again.

## 2020-04-17 NOTE — Care Management Important Message (Signed)
Important Message  Patient Details  Name: Jay Holloway MRN: [id number] Date of Birth: 11-28-1945   Medicare Important Message Given:  Yes     [name] 04/17/2020, 2:42 PM

## 2020-04-17 NOTE — Consult Note (Signed)
[place] Nurse Consult Note: Patient care given in Room [facility] [phone] Reason for Consult: coccyx wound Wound type: Unstageable  Pressure Injury POA: Yes Measurement: 0.7 cm x 0.2 cm x 0.1cm Wound bed: 100% slough Drainage (amount, consistency, odor) None Periwound: Intact Dressing procedure/placement/frequency: Apply Santyl to coccyx wound in a thick layer. Cover with a saline moistened gauze, then dry gauze..  Change daily.  Monitor the wound area(s) for worsening of condition such as: Signs/symptoms of infection,  Increase in size,  Development of or worsening of odor, Development of pain, or increased pain at the affected locations.   Notify the medical team if any of these develop.  Thank you for the consult. [place] nurse will not follow at this time.   Please re-consult the [place] team if needed.  Cathlean Marseilles Tamala Julian, MSN, RN, [place], Lysle Pearl, [facility] Wound Treatment Associate Office [phone]  Pager [phone]

## 2020-04-18 LAB — BLOOD GAS, ARTERIAL
Acid-Base Excess: 6.3 mmol/L — ABNORMAL HIGH (ref 0.0–2.0)
Bicarbonate: 31.2 mmol/L — ABNORMAL HIGH (ref 20.0–28.0)
Drawn by: 511331
FIO2: 21
O2 Saturation: 92.7 %
Patient temperature: 36.5
pCO2 arterial: 51.5 mmHg — ABNORMAL HIGH (ref 32.0–48.0)
pH, Arterial: 7.397 (ref 7.350–7.450)
pO2, Arterial: 59.3 mmHg — ABNORMAL LOW (ref 83.0–108.0)

## 2020-04-18 LAB — GLUCOSE, CAPILLARY
Glucose-Capillary: 105 mg/dL — ABNORMAL HIGH (ref 70–99)
Glucose-Capillary: 143 mg/dL — ABNORMAL HIGH (ref 70–99)
Glucose-Capillary: 146 mg/dL — ABNORMAL HIGH (ref 70–99)
Glucose-Capillary: 162 mg/dL — ABNORMAL HIGH (ref 70–99)
Glucose-Capillary: 166 mg/dL — ABNORMAL HIGH (ref 70–99)
Glucose-Capillary: 169 mg/dL — ABNORMAL HIGH (ref 70–99)

## 2020-04-18 LAB — BASIC METABOLIC PANEL
Anion gap: 9 (ref 5–15)
Anion gap: 9 (ref 5–15)
BUN: 7 mg/dL — ABNORMAL LOW (ref 8–23)
BUN: 9 mg/dL (ref 8–23)
CO2: 32 mmol/L (ref 22–32)
CO2: 29 mmol/L (ref 22–32)
Calcium: 9 mg/dL (ref 8.9–10.3)
Calcium: 8.6 mg/dL — ABNORMAL LOW (ref 8.9–10.3)
Chloride: 109 mmol/L (ref 98–111)
Chloride: 103 mmol/L (ref 98–111)
Creatinine, Ser: 1.05 mg/dL (ref 0.61–1.24)
Creatinine, Ser: 0.94 mg/dL (ref 0.61–1.24)
GFR calc Af Amer: >60 mL/min (ref >60)
GFR calc Af Amer: >60 mL/min (ref >60)
GFR calc non Af Amer: >60 mL/min (ref >60)
GFR calc non Af Amer: >60 mL/min (ref >60)
Glucose, Bld: 109 mg/dL — ABNORMAL HIGH (ref 70–99)
Glucose, Bld: 162 mg/dL — ABNORMAL HIGH (ref 70–99)
Potassium: 2.7 mmol/L — CL (ref 3.5–5.1)
Potassium: 3.5 mmol/L (ref 3.5–5.1)
Sodium: 150 mmol/L — ABNORMAL HIGH (ref 135–145)
Sodium: 141 mmol/L (ref 135–145)

## 2020-04-18 LAB — CBC
HCT: 34.8 % — ABNORMAL LOW (ref 39.0–52.0)
Hemoglobin: 10.9 g/dL — ABNORMAL LOW (ref 13.0–17.0)
MCH: 28.7 pg (ref 26.0–34.0)
MCHC: 31.3 g/dL (ref 30.0–36.0)
MCV: 91.6 fL (ref 80.0–100.0)
Platelets: 168 10*3/uL (ref 150–400)
RBC: 3.8 MIL/uL — ABNORMAL LOW (ref 4.22–5.81)
RDW: 14.7 % (ref 11.5–15.5)
WBC: 4.9 10*3/uL (ref 4.0–10.5)
nRBC: 0 % (ref 0.0–0.2)

## 2020-04-18 LAB — BRAIN NATRIURETIC PEPTIDE: B Natriuretic Peptide: 146.8 pg/mL — ABNORMAL HIGH (ref 0.0–100.0)

## 2020-04-18 LAB — MAGNESIUM: Magnesium: 1.8 mg/dL (ref 1.7–2.4)

## 2020-04-18 MED ORDER — LORAZEPAM 1 MG PO TABS: 1.0000 mg | ORAL_TABLET | Freq: Once | ORAL | Status: DC | PRN

## 2020-04-18 MED ORDER — SODIUM CHLORIDE 0.45 % IV SOLN
INTRAVENOUS | Status: DC
  Filled 2020-04-18 (×3): qty 1000

## 2020-04-18 MED ORDER — LORAZEPAM 1 MG PO TABS
1.0000 mg | ORAL_TABLET | Freq: Once | ORAL | Status: AC | PRN
  Administered 2020-04-18: 1 mg via ORAL
  Filled 2020-04-18: qty 1

## 2020-04-18 MED ORDER — CARVEDILOL 6.25 MG PO TABS
6.2500 mg | ORAL_TABLET | Freq: Two times a day (BID) | ORAL | Status: DC
  Administered 2020-04-18 – 2020-04-19 (×3): 6.25 mg via ORAL
  Filled 2020-04-18 (×3): qty 1

## 2020-04-18 MED ORDER — POTASSIUM CHLORIDE 10 MEQ/100ML IV SOLN
10.0000 meq | INTRAVENOUS | Status: AC
  Administered 2020-04-18 (×4): 10 meq via INTRAVENOUS
  Filled 2020-04-18 (×4): qty 100

## 2020-04-18 NOTE — Progress Notes (Signed)
Physical Therapy Treatment Patient Details Name: Jay Holloway MRN: [id number] DOB: 1946/04/30 Today's Date: 04/18/2020    History of Present Illness 74 y.o. male with medical history significant of hypertension, hyperlipidemia, diabetes mellitus, prostate cancer status post radical prostatectomy in 2013, GERD, CKD stage III presents to emergency department with generalized weakness. Reports that he slipped out of chair however denies any injuries or head trauma (of note, pt admitted late August 2021 for same). HEad CT and MRI brain negative; likely dehydration.    PT Comments    Patient was agitated overnight with pulling IVs and in bil wrist restraints. Patient alert and appropriate (delayed) with following commands and answering questions. Able to sit at EOB with 1 person assist requiring min-guard assist majority of 15 minutes (occasional min assist when concentrating on a task and loses balance posteriorly). Unable to stand with +1 assist, however was able to scoot laterally along bed. 2 person assist to return to supine and placed in chair position.     Follow Up Recommendations  SNF;Supervision/Assistance - 24 hour     Equipment Recommendations  Other (comment) (TBD next venue)    Recommendations for Other Services       Precautions / Restrictions Precautions Precautions: Fall Precaution Comments: fall from chair at home    Mobility  Bed Mobility Overal bed mobility: Needs Assistance Bed Mobility: Rolling;Sidelying to Sit;Sit to Sidelying Rolling: Mod assist Sidelying to sit: HOB elevated;Max assist (from side, legs assisted over, HOB elevated to 45 & up)     Sit to sidelying: Max assist;+2 for physical assistance General bed mobility comments: pt slow but follows commands to assist with mobility  Transfers Overall transfer level: Needs assistance   Transfers: Lateral/Scoot Transfers;Sit to/from Stand Sit to Stand: Max assist;From elevated surface (achieved 1/2  standing with 1 person assist)        Lateral/Scoot Transfers: Max assist;From elevated surface (pt is 6'3")    Ambulation/Gait             General Gait Details: unable   Stairs             Wheelchair Mobility    Modified Rankin (Stroke Patients Only)       Balance Overall balance assessment: Needs assistance Sitting-balance support: Feet supported;No upper extremity supported Sitting balance-Leahy Scale: Poor Sitting balance - Comments: posterior bias; can maintain with minguard and forward reaching activities (reaching for lunch items from his tray); occasional min assist to return to upright from posterior bias Postural control: Posterior lean                                  Cognition Arousal/Alertness: Awake/alert Behavior During Therapy: Flat affect Overall Cognitive Status: No family/caregiver present to determine baseline cognitive functioning Area of Impairment: Following commands;Attention;Awareness;Problem solving                   Current Attention Level: Sustained   Following Commands: Follows one step commands inconsistently;Follows one step commands with increased time   Awareness: Intellectual Problem Solving: Slow processing;Requires verbal cues;Requires tactile cues;Difficulty sequencing;Decreased initiation General Comments: Patient oriented to self, location, month, year      Exercises      General Comments General comments (skin integrity, edema, etc.): Patient denied dizziness and BP 110/65 at EOB despite low K+. Patient engaging in all activties as requested. Sat EOB ~15 minutes. NT in to assist with return to bed.  Pertinent Vitals/Pain Pain Assessment: Faces Faces Pain Scale: No hurt    Home Living                      Prior Function            PT Goals (current goals can now be found in the care plan section) Acute Rehab PT Goals Patient Stated Goal: patient agrees wants to get  stronger Time For Goal Achievement: 04/29/20 Potential to Achieve Goals: Fair Progress towards PT goals: Progressing toward goals    Frequency    Min 2X/week      PT Plan Current plan remains appropriate    Co-evaluation              AM-PAC PT "6 Clicks" Mobility   Outcome Measure  Help needed turning from your back to your side while in a flat bed without using bedrails?: A Lot Help needed moving from lying on your back to sitting on the side of a flat bed without using bedrails?: A Lot Help needed moving to and from a bed to a chair (including a wheelchair)?: Total Help needed standing up from a chair using your arms (e.g., wheelchair or bedside chair)?: Total Help needed to walk in hospital room?: Total Help needed climbing 3-5 steps with a railing? : Total 6 Click Score: 8    End of Session Equipment Utilized During Treatment: Oxygen Activity Tolerance: Patient tolerated treatment well Patient left: in bed;with call bell/phone within reach;with bed alarm set;with restraints reapplied (bil wrist restraints)   PT Visit Diagnosis: History of falling (Z91.81);Muscle weakness (generalized) (M62.81)     Time: 1610-9604 PT Time Calculation (min) (ACUTE ONLY): 33 min  Charges:  $Therapeutic Activity: 23-37 mins                      Arby Barrette, PT Pager [phone]    Rexanne Mano 04/18/2020, 1:45 PM

## 2020-04-18 NOTE — Consult Note (Signed)
   [facility] CM Inpatient Consult   04/18/2020  Brave Dack [birth year] [phone]   [employer] [ACO] Patient: [name]  Patient was assessed for [place] [facility] for community services and with high risk score for unplanned readmission with less than 30 days readmission noted. Patient was previously active with [facility].  Patient had been outreached and offered services.   Patient is currently being recommended for skilled Nursing Facility and family chose [facility].  Plan:  Follow for disposition, if goes to facility needs will be met at that level of care.  Of note, [facility] Care Management services does not replace or interfere with any services that are arranged by inpatient [facility] care management team.   For additional questions or referrals please contact:  Natividad Brood, RN BSN [facility] Liaison  [phone] business mobile phone Toll free office [phone]  Fax number: [phone] [email or web address]

## 2020-04-18 NOTE — Progress Notes (Signed)
PROGRESS NOTE  Jay Holloway XIP:[id number] DOB: 08-Apr-1946 DOA: 04/14/2020 PCP: [name], MD  HPI/Recap of past 84 hours: 74 year old male with past medical history of hypertension, diabetes mellitus, and stage III chronic kidney disease presented to the emergency room on 9/17 with complaints of weakness.  He had a recent admission for DKA and was discharged to skilled nursing approximately 3 weeks prior.  In the emergency room, found to have mild DKA.  An A1c actually noted good control at 6.2.  Patient given fluids and insulin.  CBGs have remained stable since.  On 9/19, patient noted to be a bit more lethargic, ABG noted hypercarbia with mild respiratory failure.  Patient started on BiPAP.  He is slightly more awake the following day off of BiPAP.  His sodium over the past few days noted to be increased to 152.  Patient started on half-normal saline.  Today, patient doing a little bit better.  He is actually alert and oriented x4 and can tell me what year it is and who the president is.  His speech is still somewhat garbled, but he is awake.  ABG notes improvement in hypercarbia, hypoxia and pH.  Sodium down to 150.  Patient himself with no complaints.  Assessment/Plan: Principal Problem: DKA and patient with diabetes mellitus: Stable.  Resolved with fluids and insulin doses.  A1c notes good control at 6.2. Active Problems:   DM (diabetes mellitus) ([place])   Prostate cancer ([place])   Hypertension: Blood pressure stable.   HLD (hyperlipidemia)   Acute on chronic kidney failure [facility]): Patient has had significant improvement and back to baseline.  Creatinine today at 1.28.  Hypernatremia: Unclear etiology although I suspect that this may be more of a chronic process, perhaps from poor p.o. intake.  His sodium was falsely normal on admission because of your account for elevated blood sugars from his DKA his sodium actually corrects closer to 148.  Have started gentle IV fluids with half-normal  saline.  He seemed to respond to this and sodium down from 152-150.  We will continue and added potassium in the IV fluids for his hypokalemia  Dysphagia: Seen by speech therapy who recommended dysphagia 1 diet although this is likely in part due to his somnolence likely from hypercarbia and hypernatremia.  Hopefully will improve as his mentation improves.    Hyperkalemia: Resolved, secondary to fluid shifts brought on by DKA.  Now with hypokalemia secondary to IV fluids.    GERD (gastroesophageal reflux disease)  Tachycardia: Added low-dose beta-blocker.  Overweight: Patient is criteria BMI greater than 25    Generalized weakness: Evaluated by physical therapy and will need skilled nursing.  Acute respiratory failure with hypercarbia: May be more of chronic CO2 retention secondary to somnolence brought on by hypernatremia.  ABG today notes improvement.  Patient seems a bit more alert.   No previous history of COPD.  No history of CHF.  We will continue to evaluate.  Code Status: DNR  Family Communication: Left message for family  Disposition Plan: Anticipate discharge to skilled nursing tomorrow as sodium gets lower.   Consultants:  None  Procedures:  None  Antimicrobials:  None  DVT prophylaxis: Lovenox   Objective: Vitals:   04/18/20 0813 04/18/20 0930  BP: 126/76 115/68  Pulse: (!) 102 (!) 111  Resp: 15   Temp: 98.4 F (36.9 C)   SpO2: 100%     Intake/Output Summary (Last 24 hours) at 04/18/2020 1139 Last data filed at 04/18/2020 0352 Gross per  24 hour  Intake 1353.4 ml  Output 2105 ml  Net -751.6 ml   Filed Weights   04/15/20 0500 04/16/20 0408 04/17/20 0320  Weight: 92.8 kg 94.1 kg 96.4 kg   Body mass index is 28.82 kg/m.  Exam:   General: A bit more awake, oriented x3 and can also tell me who the president is.    HEENT: Normocephalic, atraumatic, mucous membranes are moist.  Poor dentition.  Neck: Supple, no JVD  Cardiovascular: Regular  rate and rhythm, borderline tachycardia  Respiratory: Decreased breath sounds throughout  Abdomen: Soft, nontender, nondistended, positive bowel sounds  Musculoskeletal: No clubbing or cyanosis or edema  Psychiatry: Appropriate, no evidence of psychoses  Neuro: No focal deficits   Data Reviewed: CBC: Recent Labs  Lab 04/14/20 1600 04/14/20 1617 04/15/20 0154 04/16/20 0213 04/18/20 0420  WBC 9.7  --  10.0 7.9 4.9  NEUTROABS 6.4  --   --  4.8  --   HGB 12.4* 14.6 12.6* 10.9* 10.9*  HCT 42.9 43.0 43.0 35.9* 34.8*  MCV 97.1  --  95.8 95.7 91.6  PLT 282  --  224 206 062   Basic Metabolic Panel: Recent Labs  Lab 04/14/20 1814 04/14/20 1838 04/14/20 2245 04/16/20 0213 04/16/20 0643 04/16/20 1454 04/17/20 0201 04/18/20 0420  NA  --   --    < > 148* 148* 152* 152* 150*  K  --   --    < > 5.2* 4.2 3.9 3.4* 2.7*  CL  --   --    < > 113* 111 112* 115* 109  CO2  --   --    < > 22 26 29 29  32  GLUCOSE  --   --    < > 148* 139* 163* 117* 109*  BUN  --   --    < > 12 11 9  7* 7*  CREATININE  --   --    < > 1.50* 1.40* 1.34* 1.28* 1.05  CALCIUM  --   --    < > 8.8* 9.0 9.0 9.3 9.0  MG 2.1  --   --   --   --   --   --  1.8  PHOS  --  4.9*  --   --   --   --   --   --    < > = values in this interval not displayed.   GFR: Estimated Creatinine Clearance: 75.4 mL/min (by C-G formula based on SCr of 1.05 mg/dL). Liver Function Tests: Recent Labs  Lab 04/14/20 1600 04/15/20 0154  AST 20 12*  ALT 21 20  ALKPHOS 75 66  BILITOT 2.0* 1.8*  PROT 6.7 6.1*  ALBUMIN 3.7 3.4*   No results for input(s): LIPASE, AMYLASE in the last 168 hours. No results for input(s): AMMONIA in the last 168 hours. Coagulation Profile: Recent Labs  Lab 04/14/20 1649  INR 1.0   Cardiac Enzymes: No results for input(s): CKTOTAL, CKMB, CKMBINDEX, TROPONINI in the last 168 hours. BNP (last 3 results) No results for input(s): PROBNP in the last 8760 hours. HbA1C: No results for input(s): HGBA1C  in the last 72 hours. CBG: Recent Labs  Lab 04/17/20 1553 04/17/20 2055 04/17/20 2342 04/18/20 0335 04/18/20 0820  GLUCAP 149* 185* 156* 105* 162*   Lipid Profile: No results for input(s): CHOL, HDL, LDLCALC, TRIG, CHOLHDL, LDLDIRECT in the last 72 hours. Thyroid Function Tests: No results for input(s): TSH, T4TOTAL, FREET4, T3FREE, THYROIDAB in the last 72  hours. Anemia Panel: No results for input(s): VITAMINB12, FOLATE, FERRITIN, TIBC, IRON, RETICCTPCT in the last 72 hours. Urine analysis:    Component Value Date/Time   COLORURINE YELLOW 04/14/2020 [place] 04/14/2020 1743   LABSPEC 1.015 04/14/2020 1743   PHURINE 5.0 04/14/2020 1743   GLUCOSEU >=500 (A) 04/14/2020 1743   HGBUR NEGATIVE 04/14/2020 1743   BILIRUBINUR NEGATIVE 04/14/2020 1743   KETONESUR 80 (A) 04/14/2020 1743   PROTEINUR 30 (A) 04/14/2020 1743   UROBILINOGEN 0.2 11/05/2014 1712   NITRITE NEGATIVE 04/14/2020 1743   LEUKOCYTESUR NEGATIVE 04/14/2020 1743   Sepsis Labs: @LABRCNTIP (procalcitonin:4,lacticidven:4)  ) Recent Results (from the past 240 hour(s))  SARS Coronavirus 2 by RT PCR (hospital order, performed in [facility] lab) Nasopharyngeal Nasopharyngeal Swab     Status: None   Collection Time: 04/14/20  4:56 PM   Specimen: Nasopharyngeal Swab  Result Value Ref Range Status   SARS Coronavirus 2 NEGATIVE NEGATIVE Final    Comment: (NOTE) SARS-CoV-2 target nucleic acids are NOT DETECTED.  The SARS-CoV-2 RNA is generally detectable in upper and lower respiratory specimens during the acute phase of infection. The lowest concentration of SARS-CoV-2 viral copies this assay can detect is 250 copies / mL. A negative result does not preclude SARS-CoV-2 infection and should not be used as the sole basis for treatment or other patient management decisions.  A negative result may occur with improper specimen collection / handling, submission of specimen other than nasopharyngeal  swab, presence of viral mutation(s) within the areas targeted by this assay, and inadequate number of viral copies (<250 copies / mL). A negative result must be combined with clinical observations, patient history, and epidemiological information.  Fact Sheet for Patients:   StrictlyIdeas.no  Fact Sheet for Healthcare Providers: BankingDealers.co.za  This test is not yet approved or  cleared by the [place] FDA and has been authorized for detection and/or diagnosis of SARS-CoV-2 by FDA under an Emergency Use Authorization (EUA).  This EUA will remain in effect (meaning this test can be used) for the duration of the COVID-19 declaration under Section 564(b)(1) of the Act, 21 U.S.C. section 360bbb-3(b)(1), unless the authorization is terminated or revoked sooner.  Performed at [facility], [place] [address] [place], [place] [address]   SARS CORONAVIRUS 2 (TAT 6-24 HRS) Nasopharyngeal Nasopharyngeal Swab     Status: None   Collection Time: 04/17/20  3:55 PM   Specimen: Nasopharyngeal Swab  Result Value Ref Range Status   SARS Coronavirus 2 NEGATIVE NEGATIVE Final    Comment: (NOTE) SARS-CoV-2 target nucleic acids are NOT DETECTED.  The SARS-CoV-2 RNA is generally detectable in upper and lower respiratory specimens during the acute phase of infection. Negative results do not preclude SARS-CoV-2 infection, do not rule out co-infections with other pathogens, and should not be used as the sole basis for treatment or other patient management decisions. Negative results must be combined with clinical observations, patient history, and epidemiological information. The expected result is Negative.  Fact Sheet for Patients: SugarRoll.be  Fact Sheet for Healthcare Providers: https://www.woods-mathews.com/  This test is not yet approved or cleared by the [place] FDA and  has been  authorized for detection and/or diagnosis of SARS-CoV-2 by FDA under an Emergency Use Authorization (EUA). This EUA will remain  in effect (meaning this test can be used) for the duration of the COVID-19 declaration under Se ction 564(b)(1) of the Act, 21 U.S.C. section 360bbb-3(b)(1), unless the authorization is terminated or  revoked sooner.  Performed at [facility], [place] [address], [place], [place] [address]       Studies: No results found.  Scheduled Meds: . carvedilol  6.25 mg Oral BID WC  . collagenase   Topical Daily  . heparin  5,000 Units Subcutaneous Q8H  . insulin aspart  0-9 Units Subcutaneous Q4H  . insulin detemir  5 Units Subcutaneous Q24H  . sodium chloride flush  3 mL Intravenous Once    Continuous Infusions: . potassium chloride 10 mEq (04/18/20 1136)  . sodium chloride 0.45 % with kcl       LOS: 4 days     Annita Brod, MD [facility]   04/18/2020, 11:39 AM

## 2020-04-19 DIAGNOSIS — E874 Mixed disorder of acid-base balance: Secondary | ICD-10-CM | POA: Diagnosis present

## 2020-04-19 DIAGNOSIS — R634 Abnormal weight loss: Secondary | ICD-10-CM | POA: Diagnosis not present

## 2020-04-19 DIAGNOSIS — N281 Cyst of kidney, acquired: Secondary | ICD-10-CM | POA: Diagnosis not present

## 2020-04-19 DIAGNOSIS — R197 Diarrhea, unspecified: Secondary | ICD-10-CM | POA: Diagnosis not present

## 2020-04-19 DIAGNOSIS — E875 Hyperkalemia: Secondary | ICD-10-CM | POA: Diagnosis not present

## 2020-04-19 DIAGNOSIS — I248 Other forms of acute ischemic heart disease: Secondary | ICD-10-CM | POA: Diagnosis present

## 2020-04-19 DIAGNOSIS — Z20822 Contact with and (suspected) exposure to covid-19: Secondary | ICD-10-CM | POA: Diagnosis not present

## 2020-04-19 DIAGNOSIS — M79662 Pain in left lower leg: Secondary | ICD-10-CM | POA: Diagnosis not present

## 2020-04-19 DIAGNOSIS — D649 Anemia, unspecified: Secondary | ICD-10-CM | POA: Diagnosis present

## 2020-04-19 DIAGNOSIS — R600 Localized edema: Secondary | ICD-10-CM | POA: Diagnosis not present

## 2020-04-19 DIAGNOSIS — R29810 Facial weakness: Secondary | ICD-10-CM | POA: Diagnosis not present

## 2020-04-19 DIAGNOSIS — M6281 Muscle weakness (generalized): Secondary | ICD-10-CM | POA: Diagnosis not present

## 2020-04-19 DIAGNOSIS — I2699 Other pulmonary embolism without acute cor pulmonale: Secondary | ICD-10-CM | POA: Diagnosis not present

## 2020-04-19 DIAGNOSIS — Z7189 Other specified counseling: Secondary | ICD-10-CM | POA: Diagnosis not present

## 2020-04-19 DIAGNOSIS — R5381 Other malaise: Secondary | ICD-10-CM | POA: Diagnosis not present

## 2020-04-19 DIAGNOSIS — A4153 Sepsis due to Serratia: Secondary | ICD-10-CM | POA: Diagnosis not present

## 2020-04-19 DIAGNOSIS — I5031 Acute diastolic (congestive) heart failure: Secondary | ICD-10-CM | POA: Clinically undetermined

## 2020-04-19 DIAGNOSIS — R404 Transient alteration of awareness: Secondary | ICD-10-CM | POA: Diagnosis not present

## 2020-04-19 DIAGNOSIS — K219 Gastro-esophageal reflux disease without esophagitis: Secondary | ICD-10-CM | POA: Diagnosis not present

## 2020-04-19 DIAGNOSIS — R918 Other nonspecific abnormal finding of lung field: Secondary | ICD-10-CM | POA: Diagnosis not present

## 2020-04-19 DIAGNOSIS — R112 Nausea with vomiting, unspecified: Secondary | ICD-10-CM | POA: Diagnosis not present

## 2020-04-19 DIAGNOSIS — R0989 Other specified symptoms and signs involving the circulatory and respiratory systems: Secondary | ICD-10-CM | POA: Diagnosis not present

## 2020-04-19 DIAGNOSIS — R0602 Shortness of breath: Secondary | ICD-10-CM | POA: Diagnosis not present

## 2020-04-19 DIAGNOSIS — R0981 Nasal congestion: Secondary | ICD-10-CM | POA: Diagnosis not present

## 2020-04-19 DIAGNOSIS — L89153 Pressure ulcer of sacral region, stage 3: Secondary | ICD-10-CM | POA: Diagnosis not present

## 2020-04-19 DIAGNOSIS — E1110 Type 2 diabetes mellitus with ketoacidosis without coma: Secondary | ICD-10-CM | POA: Diagnosis not present

## 2020-04-19 DIAGNOSIS — I10 Essential (primary) hypertension: Secondary | ICD-10-CM | POA: Diagnosis not present

## 2020-04-19 DIAGNOSIS — J9602 Acute respiratory failure with hypercapnia: Secondary | ICD-10-CM | POA: Diagnosis not present

## 2020-04-19 DIAGNOSIS — R410 Disorientation, unspecified: Secondary | ICD-10-CM | POA: Diagnosis not present

## 2020-04-19 DIAGNOSIS — L89152 Pressure ulcer of sacral region, stage 2: Secondary | ICD-10-CM | POA: Diagnosis present

## 2020-04-19 DIAGNOSIS — E1122 Type 2 diabetes mellitus with diabetic chronic kidney disease: Secondary | ICD-10-CM | POA: Diagnosis not present

## 2020-04-19 DIAGNOSIS — R609 Edema, unspecified: Secondary | ICD-10-CM | POA: Diagnosis not present

## 2020-04-19 DIAGNOSIS — E870 Hyperosmolality and hypernatremia: Secondary | ICD-10-CM

## 2020-04-19 DIAGNOSIS — R4182 Altered mental status, unspecified: Secondary | ICD-10-CM | POA: Diagnosis not present

## 2020-04-19 DIAGNOSIS — L89150 Pressure ulcer of sacral region, unstageable: Secondary | ICD-10-CM | POA: Diagnosis not present

## 2020-04-19 DIAGNOSIS — R531 Weakness: Secondary | ICD-10-CM | POA: Diagnosis not present

## 2020-04-19 DIAGNOSIS — A419 Sepsis, unspecified organism: Secondary | ICD-10-CM | POA: Diagnosis present

## 2020-04-19 DIAGNOSIS — I2510 Atherosclerotic heart disease of native coronary artery without angina pectoris: Secondary | ICD-10-CM | POA: Diagnosis not present

## 2020-04-19 DIAGNOSIS — J9811 Atelectasis: Secondary | ICD-10-CM | POA: Diagnosis not present

## 2020-04-19 DIAGNOSIS — R110 Nausea: Secondary | ICD-10-CM | POA: Diagnosis not present

## 2020-04-19 DIAGNOSIS — J9601 Acute respiratory failure with hypoxia: Secondary | ICD-10-CM | POA: Diagnosis not present

## 2020-04-19 DIAGNOSIS — G894 Chronic pain syndrome: Secondary | ICD-10-CM | POA: Diagnosis not present

## 2020-04-19 DIAGNOSIS — R5383 Other fatigue: Secondary | ICD-10-CM | POA: Diagnosis not present

## 2020-04-19 DIAGNOSIS — I4510 Unspecified right bundle-branch block: Secondary | ICD-10-CM | POA: Diagnosis not present

## 2020-04-19 DIAGNOSIS — I5032 Chronic diastolic (congestive) heart failure: Secondary | ICD-10-CM | POA: Diagnosis present

## 2020-04-19 DIAGNOSIS — J9600 Acute respiratory failure, unspecified whether with hypoxia or hypercapnia: Secondary | ICD-10-CM | POA: Diagnosis not present

## 2020-04-19 DIAGNOSIS — Z515 Encounter for palliative care: Secondary | ICD-10-CM | POA: Diagnosis not present

## 2020-04-19 DIAGNOSIS — E782 Mixed hyperlipidemia: Secondary | ICD-10-CM | POA: Diagnosis not present

## 2020-04-19 DIAGNOSIS — E1169 Type 2 diabetes mellitus with other specified complication: Secondary | ICD-10-CM | POA: Diagnosis not present

## 2020-04-19 DIAGNOSIS — D696 Thrombocytopenia, unspecified: Secondary | ICD-10-CM | POA: Diagnosis present

## 2020-04-19 DIAGNOSIS — E1165 Type 2 diabetes mellitus with hyperglycemia: Secondary | ICD-10-CM | POA: Diagnosis not present

## 2020-04-19 DIAGNOSIS — R1312 Dysphagia, oropharyngeal phase: Secondary | ICD-10-CM | POA: Diagnosis not present

## 2020-04-19 DIAGNOSIS — Z794 Long term (current) use of insulin: Secondary | ICD-10-CM | POA: Diagnosis not present

## 2020-04-19 DIAGNOSIS — J156 Pneumonia due to other aerobic Gram-negative bacteria: Secondary | ICD-10-CM | POA: Diagnosis not present

## 2020-04-19 DIAGNOSIS — N179 Acute kidney failure, unspecified: Secondary | ICD-10-CM | POA: Diagnosis not present

## 2020-04-19 DIAGNOSIS — N390 Urinary tract infection, site not specified: Secondary | ICD-10-CM | POA: Diagnosis not present

## 2020-04-19 DIAGNOSIS — Z66 Do not resuscitate: Secondary | ICD-10-CM | POA: Diagnosis not present

## 2020-04-19 DIAGNOSIS — I82441 Acute embolism and thrombosis of right tibial vein: Secondary | ICD-10-CM | POA: Diagnosis present

## 2020-04-19 DIAGNOSIS — R05 Cough: Secondary | ICD-10-CM | POA: Diagnosis not present

## 2020-04-19 DIAGNOSIS — M79661 Pain in right lower leg: Secondary | ICD-10-CM | POA: Diagnosis not present

## 2020-04-19 DIAGNOSIS — Z7401 Bed confinement status: Secondary | ICD-10-CM | POA: Diagnosis not present

## 2020-04-19 DIAGNOSIS — E872 Acidosis: Secondary | ICD-10-CM | POA: Diagnosis not present

## 2020-04-19 DIAGNOSIS — J189 Pneumonia, unspecified organism: Secondary | ICD-10-CM | POA: Diagnosis not present

## 2020-04-19 DIAGNOSIS — I5021 Acute systolic (congestive) heart failure: Secondary | ICD-10-CM | POA: Diagnosis not present

## 2020-04-19 DIAGNOSIS — J80 Acute respiratory distress syndrome: Secondary | ICD-10-CM | POA: Diagnosis not present

## 2020-04-19 DIAGNOSIS — G4730 Sleep apnea, unspecified: Secondary | ICD-10-CM | POA: Diagnosis present

## 2020-04-19 DIAGNOSIS — N200 Calculus of kidney: Secondary | ICD-10-CM | POA: Diagnosis not present

## 2020-04-19 DIAGNOSIS — I82411 Acute embolism and thrombosis of right femoral vein: Secondary | ICD-10-CM | POA: Diagnosis not present

## 2020-04-19 DIAGNOSIS — R000 Tachycardia, unspecified: Secondary | ICD-10-CM | POA: Diagnosis not present

## 2020-04-19 DIAGNOSIS — R7989 Other specified abnormal findings of blood chemistry: Secondary | ICD-10-CM | POA: Diagnosis not present

## 2020-04-19 DIAGNOSIS — I82419 Acute embolism and thrombosis of unspecified femoral vein: Secondary | ICD-10-CM | POA: Diagnosis not present

## 2020-04-19 DIAGNOSIS — R6520 Severe sepsis without septic shock: Secondary | ICD-10-CM | POA: Diagnosis present

## 2020-04-19 DIAGNOSIS — I130 Hypertensive heart and chronic kidney disease with heart failure and stage 1 through stage 4 chronic kidney disease, or unspecified chronic kidney disease: Secondary | ICD-10-CM | POA: Diagnosis present

## 2020-04-19 DIAGNOSIS — N183 Chronic kidney disease, stage 3 unspecified: Secondary | ICD-10-CM | POA: Diagnosis not present

## 2020-04-19 DIAGNOSIS — G9341 Metabolic encephalopathy: Secondary | ICD-10-CM | POA: Diagnosis not present

## 2020-04-19 DIAGNOSIS — M2550 Pain in unspecified joint: Secondary | ICD-10-CM | POA: Diagnosis not present

## 2020-04-19 DIAGNOSIS — E785 Hyperlipidemia, unspecified: Secondary | ICD-10-CM | POA: Diagnosis not present

## 2020-04-19 DIAGNOSIS — J690 Pneumonitis due to inhalation of food and vomit: Secondary | ICD-10-CM | POA: Diagnosis not present

## 2020-04-19 LAB — BLOOD GAS, ARTERIAL
Acid-Base Excess: 8.6 mmol/L — ABNORMAL HIGH (ref 0.0–2.0)
Bicarbonate: 33.1 mmol/L — ABNORMAL HIGH (ref 20.0–28.0)
Drawn by: 519031
FIO2: 21
O2 Saturation: 95.5 %
Patient temperature: 37
pCO2 arterial: 50.5 mmHg — ABNORMAL HIGH (ref 32.0–48.0)
pH, Arterial: 7.432 (ref 7.350–7.450)
pO2, Arterial: 67.7 mmHg — ABNORMAL LOW (ref 83.0–108.0)

## 2020-04-19 LAB — BASIC METABOLIC PANEL
Anion gap: 6 (ref 5–15)
BUN: 10 mg/dL (ref 8–23)
CO2: 34 mmol/L — ABNORMAL HIGH (ref 22–32)
Calcium: 8.8 mg/dL — ABNORMAL LOW (ref 8.9–10.3)
Chloride: 106 mmol/L (ref 98–111)
Creatinine, Ser: 0.91 mg/dL (ref 0.61–1.24)
GFR calc Af Amer: >60 mL/min (ref >60)
GFR calc non Af Amer: >60 mL/min (ref >60)
Glucose, Bld: 134 mg/dL — ABNORMAL HIGH (ref 70–99)
Potassium: 3.7 mmol/L (ref 3.5–5.1)
Sodium: 146 mmol/L — ABNORMAL HIGH (ref 135–145)

## 2020-04-19 LAB — GLUCOSE, CAPILLARY
Glucose-Capillary: 128 mg/dL — ABNORMAL HIGH (ref 70–99)
Glucose-Capillary: 118 mg/dL — ABNORMAL HIGH (ref 70–99)
Glucose-Capillary: 235 mg/dL — ABNORMAL HIGH (ref 70–99)

## 2020-04-19 LAB — SARS CORONAVIRUS 2 BY RT PCR (HOSPITAL ORDER, PERFORMED IN ~~LOC~~ HOSPITAL LAB): SARS Coronavirus 2: NEGATIVE

## 2020-04-19 MED ORDER — CARVEDILOL 6.25 MG PO TABS
6.2500 mg | ORAL_TABLET | Freq: Two times a day (BID) | ORAL | 1 refills | Status: DC
Start: 2020-04-19 — End: 2020-05-05

## 2020-04-19 MED ORDER — COLLAGENASE 250 UNIT/GM EX OINT: TOPICAL_OINTMENT | Freq: Every day | CUTANEOUS | 0 refills | Status: DC

## 2020-04-19 MED ORDER — FUROSEMIDE 10 MG/ML IJ SOLN: 20.0000 mg | Freq: Once | INTRAMUSCULAR | Status: DC

## 2020-04-19 NOTE — TOC Transition Note (Signed)
Transition of Care [facility]) - CM/SW Discharge Note   Patient Details  Name: Jay Holloway MRN: [id number] Date of Birth: [birth year]  [facility]) CM/SW Contact:  Marney Setting, [place] Work Phone Number: 04/19/2020, 2:07 PM   Clinical Narrative:   Nurse to call to report to [phone]. Rm. [phone]    Final next level of care: Skilled Nursing Facility Barriers to Discharge: Barriers Resolved   Patient Goals and CMS Choice   CMS Medicare.gov Compare Post Acute Care list provided to:: Patient Represenative (must comment) Holley Bouche) Choice offered to / list presented to :  Holley Bouche)  Discharge Placement              Patient [facility] bed at: [facility] Patient to be transferred to facility by: [place] Name of family member notified: Left a voicemail for [facility] Patient and family notified of of transfer: 04/19/20  Discharge Plan and Services In-house Referral: Clinical Social Work                                   Social Determinants of Health ([place]) Interventions     Readmission Risk Interventions No flowsheet data found.

## 2020-04-19 NOTE — Discharge Summary (Addendum)
Discharge Summary  Jay Holloway RSW:[id number] DOB: 12-Jun-1946  PCP: [name], MD  Admit date: 04/14/2020 Discharge date: 04/19/2020  Time spent: 35 minutes  Recommendations for Outpatient Follow-up:  1. Medication change: Patient prior to coming in was on Norco, this has been discontinued 2. New medication: Coreg 6.25 p.o. twice daily 3. Patient is being discharged to skilled nursing 4. Dietary change: Patient has been placed on a dysphagia 2 diet 5. Wound care: Apply Santyl to coccyx wound in a thick layer. Cover with a saline moistened gauze, then dry gauze..  Change daily. 6. Patient will follow up with his PCP Dr. [name] in approximately 2 weeks.  Would recommend patient have an outpatient echocardiogram done 7. Advised for patient to have a basic metabolic panel checked in 1 week. 8. Patient should have his weight checked daily every morning. 9. Patient should have a reassessment by speech therapy in 2 to 4 weeks 10. Patient's Norvasc is been discontinued   Discharge Diagnoses:  Active Hospital Problems   Diagnosis Date Noted  . Acute respiratory failure with hypoxia and hypercarbia ([place]) 04/17/2020  . Decubitus ulcer of sacral region, unstageable ([place]) 04/19/2020  . Hypernatremia 04/17/2020  . Dysphagia 04/17/2020  . Acute metabolic encephalopathy 09/38/1829  . CKD (chronic kidney disease), stage III 04/17/2020  . Hyperkalemia 04/14/2020  . GERD (gastroesophageal reflux disease) 04/14/2020  . Generalized weakness 04/14/2020  . Acute on chronic kidney failure ([place]) 03/21/2020  . Prostate cancer ([place])   . Hypertension   . HLD (hyperlipidemia)   . Weakness   . DM (diabetes mellitus) ([place])   . DKA (diabetic ketoacidoses) ([place]) 11/05/2014    Resolved Hospital Problems  No resolved problems to display.    Discharge Condition: Improved, being discharged to skilled nursing  Diet recommendation: Dysphagia 2 with thin liquids  Vitals:   04/19/20 0317 04/19/20 0745   BP: 117/80 130/88  Pulse: 89 94  Resp:  18  Temp: 98 F (36.7 C) 98.7 F (37.1 C)  SpO2: 100% 98%    History of present illness:  74 year old male with past medical history of hypertension, diabetes mellitus, and stage III chronic kidney disease presented to the emergency room on 9/17 with complaints of weakness.  He had a recent admission for DKA and was discharged to skilled nursing approximately 3 weeks prior.  In the emergency room, found to have mild DKA.  An A1c actually noted good control at 6.2.  Patient given fluids and insulin.   Hospital Course:  Principal Problem Weakness: Evaluated by PT and OT who recommended skilled nursing.    Acute metabolic encephalopathy: Secondary to DKA and then hypercarbia.  Although patient speech is slightly garbled, he is actually quite fully alert and oriented.  He was able to tell me what year it is and who the president is.    Decubitus ulcer of sacral region, unstageable [facility]): Present on admission.  Noted by nursing.  Wound care consulted.  Santyl dressing daily ordered.  This will continue upon discharge. DKA and patient with controlled diabetes mellitus, recently started on insulin: Stable.  Resolved with fluids and insulin doses.  A1c notes good control at 6.2.  Unclear what set this off.  He had a recent hospitalization for DKA.  Is certainly possible that he has underlying heart failure and his initial fluid resuscitation from his first DKA caused him to go back into DKA and the DKA itself causes lateral diuresis so he actually appeared dehydrated.  Other possibilities that set  this off might have been his hypernatremia or hypercarbia from decreased level of consciousness. Active Problems:   DM (diabetes mellitus) ([place])   Prostate cancer ([place])   Hypertension: Blood pressure stable.  Especially with diuresis.    HLD (hyperlipidemia): Continue statin.    Acute on chronic kidney failure (HCC)/stage II chronic kidney disease: Creatinine on  admission at 1.79 with a GFR of 37.  However with correction of DKA and receiving half-normal saline fluids, creatinine on day of discharge at 0.91 with a GFR greater than 60.  That said, he does have some underlying renal disease and he is likely a bit volume overloaded on discharge.  Would favor that he has stage II or IIIa chronic kidney disease and once his Lasix has been restarted, this will be more apparent.  Hypernatremia: Unclear etiology although I suspect that this may be more of a chronic process, perhaps from poor p.o. intake, brought on by decreased level consciousness which could cause his hypercarbia before coming in.  His sodium was falsely normal at 140 on admission accounting for elevated blood sugars from his DKA his sodium actually corrected closer to 148.  Have started gentle IV fluids with half-normal saline.  He seemed to respond to this and sodium peaked at 152 and by day of discharge down to 146.   Recheck basic metabolic panel in 1 week  Dysphagia: Seen by speech therapy who recommended dysphagia 1 diet although this is likely in part due to his somnolence likely from hypercarbia and hypernatremia.    As his mentation improved, patient was seen by speech therapy who recommended upgrading his diet to dysphagia 2.  He will follow-up with speech therapy in 2 to 4 weeks at skilled nursing to see if he can be upgraded further.    Hyperkalemia: Resolved, secondary to fluid shifts brought on by DKA.  Now with hypokalemia secondary to saline fluids.  Resolved.  Potassium 3.7 on day of discharge.    GERD (gastroesophageal reflux disease)  Tachycardia: Added low-dose beta-blocker.  He will continue Coreg as outpatient  Overweight: Patient is criteria BMI greater than 25  Acute diastolic heart failure: No previous history of CHF.  On 9/21, checked a BNP and found to be minimally elevated at 147.  I suspect he may have some mild diastolic heart failure.  He needed half-normal saline  to improve his sodium levels.  He received a dose of Lasix prior to discharge and we are restarting his home Lasix.  Would recommend an outpatient echo.  Because of the tendency to hold onto fluid, discontinue Norvasc.      Generalized weakness: Evaluated by physical therapy and will need skilled nursing.  Acute respiratory failure with hypercarbia and hypoxia: On 9/19, patient noted to be a bit more lethargic, ABG noted hypercarbia with mild respiratory failure.  Patient started on BiPAP and by following day, more awake.  As we improved his sodium, ABG improved significantly and patient was even more alert.  He has no previous history of COPD or CHF.    He was on Norco at home which may have been a contributing factor to somnolence and hypercarbia.  Since he has not needed it during his hospitalization, have recommended it be discontinued.  I noted that he had a increase in his bicarb level on day of discharge, 9/22.  ABG checked noted no evidence of hypoxia with a pH of 7.43, PCO2 of 50, down from previous day.  Oxygen saturations have been fine today on  room air.  Procedures:  None  Consultations:  None  Discharge Exam: BP 130/88 (BP Location: Left Arm)   Pulse 94   Temp 98.7 F (37.1 C) (Oral)   Resp 18   Ht 6' (1.829 m)   Wt 96.4 kg   SpO2 98%   BMI 28.82 kg/m   General: Alert and oriented x3, no acute distress Cardiovascular: Regular rate and rhythm, S1-S2 Respiratory: Clear to auscultation bilaterally  Discharge Instructions You were cared for by a hospitalist during your hospital stay. If you have any questions about your discharge medications or the care you received while you were in the hospital after you are discharged, you can call the unit and asked to speak with the hospitalist on call if the hospitalist that took care of you is not available. Once you are discharged, your primary care physician will handle any further medical issues. Please note that NO REFILLS for  any discharge medications will be authorized once you are discharged, as it is imperative that you return to your primary care physician (or establish a relationship with a primary care physician if you do not have one) for your aftercare needs so that they can reassess your need for medications and monitor your lab values.  Discharge Instructions    DIET DYS 2   Complete by: As directed    Fluid consistency: Thin   Discharge wound care:   Complete by: As directed    Apply Santyl to coccyx wound in a thick layer. Cover with a saline moistened gauze, then dry gauze..  Change daily.   Increase activity slowly   Complete by: As directed      Allergies as of 04/19/2020   No Known Allergies     Medication List    STOP taking these medications   amLODipine 10 MG tablet Commonly known as: NORVASC   HYDROcodone-acetaminophen 7.5-325 MG tablet Commonly known as: NORCO     TAKE these medications   atorvastatin 10 MG tablet Commonly known as: LIPITOR Take 10 mg by mouth daily.   carvedilol 6.25 MG tablet Commonly known as: COREG Take 1 tablet (6.25 mg total) by mouth 2 (two) times daily with a meal.   collagenase ointment Commonly known as: SANTYL Apply topically daily. Apply Santyl to coccyx wound in a thick layer. Cover with a saline moistened gauze, then dry gauze..  Change daily. Start taking on: April 20, 2020   furosemide 40 MG tablet Commonly known as: LASIX Take 0.5 tablets (20 mg total) by mouth daily. fluid   insulin glargine 100 UNIT/ML injection Commonly known as: LANTUS Inject 0.1 mLs (10 Units total) into the skin daily. Give only 5 units for am glucose less than 90.   melatonin 3 MG Tabs tablet Take 1 tablet (3 mg total) by mouth at bedtime.   metFORMIN 500 MG 24 hr tablet Commonly known as: GLUCOPHAGE-XR Take 500 mg by mouth in the morning and at bedtime.   Multivitamin Adults Tabs Take 1 tablet by mouth daily.   OZEMPIC (1 MG/DOSE) [place] Inject 1 mg  into the skin once a week. Wed   pantoprazole 40 MG tablet Commonly known as: PROTONIX Take 1 tablet (40 mg total) by mouth 2 (two) times daily.   Potassium Chloride ER 20 MEQ Tbcr Take 20 mEq by mouth daily.   Vitamin D3 25 MCG (1000 UT) Caps Take 1 capsule by mouth daily.            Discharge Care Instructions  (  From admission, onward)         Start     Ordered   04/19/20 0000  Discharge wound care:       Comments: Apply Santyl to coccyx wound in a thick layer. Cover with a saline moistened gauze, then dry gauze..  Change daily.   04/19/20 1111         No Known Allergies    The results of significant diagnostics from this hospitalization (including imaging, microbiology, ancillary and laboratory) are listed below for reference.    Significant Diagnostic Studies: CT HEAD WO CONTRAST  Result Date: 04/14/2020 CLINICAL DATA:  Altered mental status. Generalized weakness for 1.5 weeks. Slurred speech. Slipped from chair. EXAM: CT HEAD WITHOUT CONTRAST TECHNIQUE: Contiguous axial images were obtained from the base of the skull through the vertex without intravenous contrast. COMPARISON:  None. FINDINGS: Brain: No evidence of parenchymal hemorrhage or extra-axial fluid collection. No mass lesion, mass effect, or midline shift. No CT evidence of acute infarction. Nonspecific mild subcortical and periventricular white matter hypodensity, most in keeping with chronic small vessel ischemic change. Cerebral volume is age appropriate. No ventriculomegaly. Vascular: No acute abnormality. Skull: No evidence of calvarial fracture. Sinuses/Orbits: The visualized paranasal sinuses are essentially clear. Other:  The mastoid air cells are unopacified. IMPRESSION: 1. No evidence of acute intracranial abnormality. No evidence of calvarial fracture. 2. Mild chronic small vessel ischemic changes in the cerebral white matter. Electronically Signed   By: Ilona Sorrel M.D.   On: 04/14/2020 17:56   MR  BRAIN WO CONTRAST  Result Date: 04/14/2020 CLINICAL DATA:  74 year old male with altered mental status, weakness. EXAM: MRI HEAD WITHOUT CONTRAST TECHNIQUE: Multiplanar, multiecho pulse sequences of the brain and surrounding structures were obtained without intravenous contrast. COMPARISON:  Head CT earlier today. FINDINGS: Brain: No restricted diffusion to suggest acute infarction. No midline shift, mass effect, evidence of mass lesion, ventriculomegaly, extra-axial collection or acute intracranial hemorrhage. Cervicomedullary junction and pituitary are within normal limits. Pear[place] Cables and white matter signal is within normal limits for age throughout the brain. No cortical encephalomalacia or chronic cerebral blood products identified. Vascular: Major intracranial vascular flow voids are preserved. 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 Disc degeneration elsewhere in the visible cervical spine. Visualized bone marrow signal is within normal limits. Sinuses/Orbits: Negative. Other: Mastoids are clear. Grossly normal visible internal auditory structures. Visible scalp and face appear negative. IMPRESSION: 1. No acute intracranial abnormality. Essentially normal for age noncontrast MRI appearance of the brain. 2. 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 Electronically Signed   By: Genevie Ann M.D.   On: 04/14/2020 22:12   DG Pelvis Portable  Result Date: 03/21/2020 CLINICAL DATA:  Pelvic pain after fall EXAM: PORTABLE PELVIS 1-2 VIEWS COMPARISON:  11/10/2014 FINDINGS: There is no evidence of pelvic fracture or diastasis. Moderate arthropathy of the bilateral hips. Degenerative changes within the visualized lower lumbar spine with syndesmophytes and interspinous ligament ossification suggesting ankylosing spondylitis. IMPRESSION: 1. No acute osseous  abnormality. 2. 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 Electronically Signed   By: Davina Poke D.O.   On: 03/21/2020 12:28   DG Chest Port 1 View  Result Date: 04/14/2020 CLINICAL DATA:  Weakness. EXAM: PORTABLE CHEST 1 VIEW COMPARISON:  03/21/2020 FINDINGS: Cardiac silhouette is normal in size. No mediastinal or hilar masses. Linear opacities noted at the  right lung base consistent with scarring or atelectasis, stable. Lungs otherwise clear. No pleural effusion or pneumothorax. Skeletal structures are grossly intact. IMPRESSION: No active disease. Electronically Signed   By: Lajean Manes M.D.   On: 04/14/2020 15:01   DG Chest Port 1 View  Result Date: 03/21/2020 CLINICAL DATA:  Weakness, fall EXAM: PORTABLE CHEST 1 VIEW COMPARISON:  12/24/2019 FINDINGS: The heart size and mediastinal contours are within normal limits. Chronic elevation of the right hemidiaphragm with associated right basilar scarring. No new focal airspace consolidation. No pleural effusion or pneumothorax. Degenerative changes of the shoulders, left worse than right. The visualized skeletal structures are unremarkable. IMPRESSION: 1. No active cardiopulmonary disease. 2. Chronic elevation of the right hemidiaphragm with associated right basilar scarring. Electronically Signed   By: Davina Poke D.O.   On: 03/21/2020 12:35   DG Swallowing Func-Speech Pathology  Result Date: 03/22/2020 Objective Swallowing Evaluation: Type of Study: MBS-Modified Barium Swallow Study  Patient Details Name: Jiyaan Steinhauser MRN: [id number] Date of Birth: 21-May-1946 Today's Date: 03/22/2020 Time: SLP Start Time (ACUTE ONLY): 4098 -SLP Stop Time (ACUTE ONLY): 1425 SLP Time Calculation (min) (ACUTE ONLY): 20 min Past Medical History: Past Medical History: Diagnosis Date . Arthritis   trouble turning head side to side and up . Diabetes mellitus without complication ([place])  . Hemorrhoids  . High cholesterol  . Hypertension  .  Prostate cancer [facility])  Past Surgical History: Past Surgical History: Procedure Laterality Date . CHOLECYSTECTOMY N/A 11/11/2014  Procedure: LAPAROSCOPIC CHOLECYSTECTOMY WITH INTRAOPERATIVE CHOLANGIOGRAM;  Surgeon: Erroll Luna, MD;  Location: [place];  Service: General;  Laterality: N/A; . COLONOSCOPY N/A 11/09/2014  Procedure: COLONOSCOPY;  Surgeon: Juanita Craver, MD;  Location: [facility] END[facility]COPY;  Service: Endoscopy;  Laterality: N/A; . CYSTOSCOPY WITH RETROGRADE PYELOGRAM, URETEROSCOPY AND STENT PLACEMENT Left 04/18/2014  Procedure: CYSTOSCOPY WITH RETROGRADE PYELOGRAM, URETEROSCOPY AND STENT PLACEMENT;  Surgeon: Raynelle Bring, MD;  Location: [facility] ORS;  Service: Urology;  Laterality: Left; . HOLMIUM LASER APPLICATION Left 08/16/1476  Procedure: HOLMIUM LASER APPLICATION;  Surgeon: Raynelle Bring, MD;  Location: [facility];  Service: Urology;  Laterality: Left; . LYMPHADENECTOMY  06/04/2012  Procedure: LYMPHADENECTOMY;  Surgeon: Dutch Gray, MD;  Location: [facility];  Service: Urology;  Laterality: Bilateral; . right  achilles tendon repair  yrs ago . ROBOT ASSISTED LAPAROSCOPIC RADICAL PROSTATECTOMY  06/04/2012  Procedure: ROBOTIC ASSISTED LAPAROSCOPIC RADICAL PROSTATECTOMY LEVEL 3;  Surgeon: Dutch Gray, MD;  Location: [facility];  Service: Urology;  Laterality: N/A; HPI: Pt is a 74 y/o male admitted secondary to fall from bed. Also found to be dehydrated. PMH includes HTN, prostate cancer, DM.  Subjective: pleasant, oriented Assessment / Plan / Recommendation CHL IP CLINICAL IMPRESSIONS 03/22/2020 Clinical Impression Patient presents with a mild-mod oral and a moderate pharyngeal dysphagia. During oral phase, patient exhibited delays in mastication and oral manipulation and anterior to posterior propulsion of regular solids, puree solids and minimally with thin liquids. Pharyngeal phase was characterized by swallow initiation delays to vallecular sinus, majority of regular solids and puree solids bolus remaining in vallecular sinus post  initial swallow. Vallecular residual did not transit with second cued swallow but with thin liquids sip, approximately 70% of vallecular residuals transited out. One instance of penetration with full clearance of penetrate was observed with thin liquids sips and occured during the swallow. Patient exhibited decreased hyoid-laryngeal movement, decreased base of tongue strength as well as osteophytes which were pushing into pharynx and causing a structural narrowing of pharyngeal space. SLP Visit Diagnosis Dysphagia,  oropharyngeal phase (R13.12) Attention and concentration deficit following -- Frontal lobe and executive function deficit following -- Impact on safety and function Mild aspiration risk   CHL IP TREATMENT RECOMMENDATION 03/22/2020 Treatment Recommendations Therapy as outlined in treatment plan below   Prognosis 03/22/2020 Prognosis for Safe Diet Advancement Good Barriers to Reach Goals -- Barriers/Prognosis Comment -- CHL IP DIET RECOMMENDATION 03/22/2020 SLP Diet Recommendations Dysphagia 1 (Puree) solids;Thin liquid Liquid Administration via Cup;Straw Medication Administration Crushed with puree Compensations Minimize environmental distractions;Small sips/bites;Slow rate;Follow solids with liquid;Multiple dry swallows after each bite/sip Postural Changes Remain semi-upright after after feeds/meals (Comment);Seated upright at 90 degrees   CHL IP OTHER RECOMMENDATIONS 03/22/2020 Recommended Consults -- Oral Care Recommendations Oral care BID Other Recommendations --   CHL IP FOLLOW UP RECOMMENDATIONS 03/22/2020 Follow up Recommendations Skilled Nursing facility   [facility] IP FREQUENCY AND DURATION 03/22/2020 Speech Therapy Frequency (ACUTE ONLY) min 2x/week Treatment Duration 1 week      CHL IP ORAL PHASE 03/22/2020 Oral Phase Impaired Oral - Pudding Teaspoon -- Oral - Pudding Cup -- Oral - Honey Teaspoon -- Oral - Honey Cup -- Oral - Nectar Teaspoon -- Oral - Nectar Cup -- Oral - Nectar Straw -- Oral - Thin Teaspoon  -- Oral - Thin Cup Reduced posterior propulsion;Delayed oral transit;Weak lingual manipulation;Premature spillage Oral - Thin Straw Weak lingual manipulation;Delayed oral transit;Reduced posterior propulsion;Premature spillage Oral - Puree Weak lingual manipulation;Delayed oral transit;Reduced posterior propulsion Oral - Mech Soft -- Oral - Regular Reduced posterior propulsion;Weak lingual manipulation;Impaired mastication;Delayed oral transit Oral - Multi-Consistency -- Oral - Pill Delayed oral transit;Reduced posterior propulsion;Weak lingual manipulation Oral Phase - Comment --  CHL IP PHARYNGEAL PHASE 03/22/2020 Pharyngeal Phase Impaired Pharyngeal- Pudding Teaspoon -- Pharyngeal -- Pharyngeal- Pudding Cup -- Pharyngeal -- Pharyngeal- Honey Teaspoon -- Pharyngeal -- Pharyngeal- Honey Cup -- Pharyngeal -- Pharyngeal- Nectar Teaspoon -- Pharyngeal -- Pharyngeal- Nectar Cup -- Pharyngeal -- Pharyngeal- Nectar Straw -- Pharyngeal -- Pharyngeal- Thin Teaspoon -- Pharyngeal -- Pharyngeal- Thin Cup Delayed swallow initiation-vallecula;Penetration/Aspiration during swallow;Pharyngeal residue - valleculae Pharyngeal Material enters airway, remains ABOVE vocal cords then ejected out Pharyngeal- Thin Straw Delayed swallow initiation-vallecula;Pharyngeal residue - valleculae Pharyngeal -- Pharyngeal- Puree Delayed swallow initiation-vallecula;Reduced laryngeal elevation;Reduced tongue base retraction;Pharyngeal residue - valleculae Pharyngeal -- Pharyngeal- Mechanical Soft -- Pharyngeal -- Pharyngeal- Regular Delayed swallow initiation-vallecula;Pharyngeal residue - valleculae Pharyngeal -- Pharyngeal- Multi-consistency -- Pharyngeal -- Pharyngeal- Pill Delayed swallow initiation-vallecula;Pharyngeal residue - valleculae Pharyngeal -- Pharyngeal Comment --  CHL IP CERVICAL ESOPHAGEAL PHASE 03/22/2020 Cervical Esophageal Phase WFL Pudding Teaspoon -- Pudding Cup -- Honey Teaspoon -- Honey Cup -- Nectar Teaspoon -- Nectar Cup  -- Nectar Straw -- Thin Teaspoon -- Thin Cup -- Thin Straw -- Puree -- Mechanical Soft -- Regular -- Multi-consistency -- Pill -- Cervical Esophageal Comment -- Sonia Baller, MA, CCC-SLP Speech Therapy [facility] Acute Rehab Pager: [phone]              Microbiology: Recent Results (from the past 240 hour(s))  SARS Coronavirus 2 by RT PCR (hospital order, performed in [facility] hospital lab) Nasopharyngeal Nasopharyngeal Swab     Status: None   Collection Time: 04/14/20  4:56 PM   Specimen: Nasopharyngeal Swab  Result Value Ref Range Status   SARS Coronavirus 2 NEGATIVE NEGATIVE Final    Comment: (NOTE) SARS-CoV-2 target nucleic acids are NOT DETECTED.  The SARS-CoV-2 RNA is generally detectable in upper and lower respiratory specimens during the acute phase of infection. The lowest concentration of SARS-CoV-2 viral copies  this assay can detect is 250 copies / mL. A negative result does not preclude SARS-CoV-2 infection and should not be used as the sole basis for treatment or other patient management decisions.  A negative result may occur with improper specimen collection / handling, submission of specimen other than nasopharyngeal swab, presence of viral mutation(s) within the areas targeted by this assay, and inadequate number of viral copies (<250 copies / mL). A negative result must be combined with clinical observations, patient history, and epidemiological information.  Fact Sheet for Patients:   StrictlyIdeas.no  Fact Sheet for Healthcare Providers: BankingDealers.co.za  This test is not yet approved or  cleared by the [place] FDA and has been authorized for detection and/or diagnosis of SARS-CoV-2 by FDA under an Emergency Use Authorization (EUA).  This EUA will remain in effect (meaning this test can be used) for the duration of the COVID-19 declaration under Section 564(b)(1) of the Act, 21 U.S.C. section 360bbb-3(b)(1),  unless the authorization is terminated or revoked sooner.  Performed at [facility], [place] [address], [place], [place] [address]   SARS CORONAVIRUS 2 (TAT 6-24 HRS) Nasopharyngeal Nasopharyngeal Swab     Status: None   Collection Time: 04/17/20  3:55 PM   Specimen: Nasopharyngeal Swab  Result Value Ref Range Status   SARS Coronavirus 2 NEGATIVE NEGATIVE Final    Comment: (NOTE) SARS-CoV-2 target nucleic acids are NOT DETECTED.  The SARS-CoV-2 RNA is generally detectable in upper and lower respiratory specimens during the acute phase of infection. Negative results do not preclude SARS-CoV-2 infection, do not rule out co-infections with other pathogens, and should not be used as the sole basis for treatment or other patient management decisions. Negative results must be combined with clinical observations, patient history, and epidemiological information. The expected result is Negative.  Fact Sheet for Patients: SugarRoll.be  Fact Sheet for Healthcare Providers: https://www.woods-mathews.com/  This test is not yet approved or cleared by the [place] FDA and  has been authorized for detection and/or diagnosis of SARS-CoV-2 by FDA under an Emergency Use Authorization (EUA). This EUA will remain  in effect (meaning this test can be used) for the duration of the COVID-19 declaration under Se ction 564(b)(1) of the Act, 21 U.S.C. section 360bbb-3(b)(1), unless the authorization is terminated or revoked sooner.  Performed at [facility], [place] [address] [place], [place] [address]      Labs: Basic Metabolic Panel: Recent Labs  Lab 04/14/20 1814 04/14/20 1838 04/14/20 2245 04/16/20 1454 04/17/20 0201 04/18/20 0420 04/18/20 2241 04/19/20 0333  NA  --   --    < > 152* 152* 150* 141 146*  K  --   --    < > 3.9 3.4* 2.7* 3.5 3.7  CL  --   --    < > 112* 115* 109 103 106  CO2  --   --    < > 29 29 32 29 34*  GLUCOSE   --   --    < > 163* 117* 109* 162* 134*  BUN  --   --    < > 9 7* 7* 9 10  CREATININE  --   --    < > 1.34* 1.28* 1.05 0.94 0.91  CALCIUM  --   --    < > 9.0 9.3 9.0 8.6* 8.8*  MG 2.1  --   --   --   --  1.8  --   --  PHOS  --  4.9*  --   --   --   --   --   --    < > = values in this interval not displayed.   Liver Function Tests: Recent Labs  Lab 04/14/20 1600 04/15/20 0154  AST 20 12*  ALT 21 20  ALKPHOS 75 66  BILITOT 2.0* 1.8*  PROT 6.7 6.1*  ALBUMIN 3.7 3.4*   No results for input(s): LIPASE, AMYLASE in the last 168 hours. No results for input(s): AMMONIA in the last 168 hours. CBC: Recent Labs  Lab 04/14/20 1600 04/14/20 1617 04/15/20 0154 04/16/20 0213 04/18/20 0420  WBC 9.7  --  10.0 7.9 4.9  NEUTROABS 6.4  --   --  4.8  --   HGB 12.4* 14.6 12.6* 10.9* 10.9*  HCT 42.9 43.0 43.0 35.9* 34.8*  MCV 97.1  --  95.8 95.7 91.6  PLT 282  --  224 206 168   Cardiac Enzymes: No results for input(s): CKTOTAL, CKMB, CKMBINDEX, TROPONINI in the last 168 hours. BNP: BNP (last 3 results) Recent Labs    12/24/19 2100 04/18/20 0420  BNP 57.8 146.8*    ProBNP (last 3 results) No results for input(s): PROBNP in the last 8760 hours.  CBG: Recent Labs  Lab 04/18/20 1626 04/18/20 2103 04/18/20 2339 04/19/20 0351 04/19/20 0740  GLUCAP 166* 146* 143* 128* 118*       Signed:  Annita Brod, MD [facility] 04/19/2020, 11:12 AM

## 2020-04-19 NOTE — Progress Notes (Signed)
RT NOTES: ABG obtained and sent to lab. Lab tech [employer].

## 2020-04-19 NOTE — Progress Notes (Signed)
  Speech Language Pathology Treatment: Dysphagia  Patient Details Name: Jay Holloway MRN: [id number] DOB: 12-19-1945 Today's Date: 04/19/2020 Time: 0930-1000 SLP Time Calculation (min) (ACUTE ONLY): 30 min  Assessment / Plan / Recommendation Clinical Impression  Patient seen to address dysphagia goals with trials of regular solids and straw sips of thin liquids. Patient reported that the puree solids "dont agree with me" as they were not appetizing and he feels they contributed to diarrhea. Patient also stated that he suspects (unclear if he was told this by anyone) that his difficulty swallowing was not anything "wrong with my throat" but that it stemmed from recent history of poor PO intake. SLP reviewed MBS films with patient as well as swallow safety strategies and recommendations to swallow liquids after solids, eat slowly, perform multiple swallows to help clear pharyngeal residuals. Patient exhibited multiple swallows with PO's of thin liquids and prolonged mastication of regular solids (saltine cracker) but did not exhibit any throat clearing, coughing or other such s/s that could be indicative of aspiration or penetration. In order to try to manage patient's PO intake with PO safety, recommending to upgrade slightly to Dys 2 from Dys 1.   HPI HPI: 74 y.o. male with medical history significant of hypertension, hyperlipidemia, diabetes mellitus, prostate cancer status post radical prostatectomy in 2013, GERD, CKD stage III presents to emergency department with generalized weakness      SLP Plan  Continue with current plan of care       Recommendations  Diet recommendations: Dysphagia 2 (fine chop);Thin liquid Liquids provided via: Cup;Straw Medication Administration: Crushed with puree Supervision: Patient able to self feed;Intermittent supervision to cue for compensatory strategies Compensations: Minimize environmental distractions;Small sips/bites;Slow rate;Follow solids with  liquid;Multiple dry swallows after each bite/sip Postural Changes and/or Swallow Maneuvers: Seated upright 90 degrees                Oral Care Recommendations: Oral care BID Follow up Recommendations: Skilled Nursing facility SLP Visit Diagnosis: Dysphagia, oropharyngeal phase (R13.12) Plan: Continue with current plan of care       GO              Sonia Baller, MA, [facility] Pager: [phone]

## 2020-04-20 DIAGNOSIS — L89150 Pressure ulcer of sacral region, unstageable: Secondary | ICD-10-CM | POA: Diagnosis not present

## 2020-04-20 DIAGNOSIS — E1122 Type 2 diabetes mellitus with diabetic chronic kidney disease: Secondary | ICD-10-CM | POA: Diagnosis not present

## 2020-04-20 DIAGNOSIS — R531 Weakness: Secondary | ICD-10-CM | POA: Diagnosis not present

## 2020-04-20 DIAGNOSIS — I10 Essential (primary) hypertension: Secondary | ICD-10-CM | POA: Diagnosis not present

## 2020-04-25 DIAGNOSIS — R05 Cough: Secondary | ICD-10-CM | POA: Diagnosis not present

## 2020-04-25 DIAGNOSIS — M79661 Pain in right lower leg: Secondary | ICD-10-CM | POA: Diagnosis not present

## 2020-04-25 DIAGNOSIS — M79662 Pain in left lower leg: Secondary | ICD-10-CM | POA: Diagnosis not present

## 2020-04-25 DIAGNOSIS — R5381 Other malaise: Secondary | ICD-10-CM | POA: Diagnosis not present

## 2020-04-25 DIAGNOSIS — R0981 Nasal congestion: Secondary | ICD-10-CM | POA: Diagnosis not present

## 2020-04-25 DIAGNOSIS — R0989 Other specified symptoms and signs involving the circulatory and respiratory systems: Secondary | ICD-10-CM | POA: Diagnosis not present

## 2020-04-26 DIAGNOSIS — R197 Diarrhea, unspecified: Secondary | ICD-10-CM | POA: Diagnosis not present

## 2020-04-26 DIAGNOSIS — G894 Chronic pain syndrome: Secondary | ICD-10-CM | POA: Diagnosis not present

## 2020-04-26 DIAGNOSIS — R634 Abnormal weight loss: Secondary | ICD-10-CM | POA: Diagnosis not present

## 2020-04-26 DIAGNOSIS — R600 Localized edema: Secondary | ICD-10-CM | POA: Diagnosis not present

## 2020-04-26 DIAGNOSIS — R110 Nausea: Secondary | ICD-10-CM | POA: Diagnosis not present

## 2020-04-26 DIAGNOSIS — R1312 Dysphagia, oropharyngeal phase: Secondary | ICD-10-CM | POA: Diagnosis not present

## 2020-04-27 DIAGNOSIS — G894 Chronic pain syndrome: Secondary | ICD-10-CM | POA: Diagnosis not present

## 2020-04-27 DIAGNOSIS — R634 Abnormal weight loss: Secondary | ICD-10-CM | POA: Diagnosis not present

## 2020-04-27 DIAGNOSIS — R197 Diarrhea, unspecified: Secondary | ICD-10-CM | POA: Diagnosis not present

## 2020-04-27 DIAGNOSIS — R112 Nausea with vomiting, unspecified: Secondary | ICD-10-CM | POA: Diagnosis not present

## 2020-04-27 DIAGNOSIS — L89153 Pressure ulcer of sacral region, stage 3: Secondary | ICD-10-CM | POA: Diagnosis not present

## 2020-04-28 ENCOUNTER — Inpatient Hospital Stay (HOSPITAL_COMMUNITY): Payer: Medicare HMO

## 2020-04-28 ENCOUNTER — Inpatient Hospital Stay (HOSPITAL_COMMUNITY)
Admission: EM | Admit: 2020-04-28 | Discharge: 2020-05-05 | DRG: 871 | Disposition: A | Discharge status: Skilled Nursing Facility | Payer: Medicare HMO | Source: Skilled Nursing Facility | Attending: Internal Medicine | Admitting: Internal Medicine

## 2020-04-28 ENCOUNTER — Other Ambulatory Visit: Payer: Self-pay

## 2020-04-28 ENCOUNTER — Emergency Department (HOSPITAL_COMMUNITY): Payer: Medicare HMO

## 2020-04-28 ENCOUNTER — Encounter (HOSPITAL_COMMUNITY): Payer: Self-pay

## 2020-04-28 DIAGNOSIS — I82419 Acute embolism and thrombosis of unspecified femoral vein: Secondary | ICD-10-CM | POA: Diagnosis not present

## 2020-04-28 DIAGNOSIS — R410 Disorientation, unspecified: Secondary | ICD-10-CM | POA: Diagnosis not present

## 2020-04-28 DIAGNOSIS — I82492 Acute embolism and thrombosis of other specified deep vein of left lower extremity: Secondary | ICD-10-CM | POA: Diagnosis not present

## 2020-04-28 DIAGNOSIS — J189 Pneumonia, unspecified organism: Secondary | ICD-10-CM | POA: Diagnosis present

## 2020-04-28 DIAGNOSIS — E782 Mixed hyperlipidemia: Secondary | ICD-10-CM | POA: Diagnosis not present

## 2020-04-28 DIAGNOSIS — I82411 Acute embolism and thrombosis of right femoral vein: Secondary | ICD-10-CM | POA: Diagnosis not present

## 2020-04-28 DIAGNOSIS — Z87891 Personal history of nicotine dependence: Secondary | ICD-10-CM

## 2020-04-28 DIAGNOSIS — D649 Anemia, unspecified: Secondary | ICD-10-CM | POA: Diagnosis present

## 2020-04-28 DIAGNOSIS — F0390 Unspecified dementia without behavioral disturbance: Secondary | ICD-10-CM | POA: Diagnosis present

## 2020-04-28 DIAGNOSIS — E785 Hyperlipidemia, unspecified: Secondary | ICD-10-CM | POA: Diagnosis present

## 2020-04-28 DIAGNOSIS — I248 Other forms of acute ischemic heart disease: Secondary | ICD-10-CM | POA: Diagnosis present

## 2020-04-28 DIAGNOSIS — Z515 Encounter for palliative care: Secondary | ICD-10-CM | POA: Diagnosis not present

## 2020-04-28 DIAGNOSIS — N179 Acute kidney failure, unspecified: Secondary | ICD-10-CM | POA: Diagnosis not present

## 2020-04-28 DIAGNOSIS — N281 Cyst of kidney, acquired: Secondary | ICD-10-CM | POA: Diagnosis not present

## 2020-04-28 DIAGNOSIS — K219 Gastro-esophageal reflux disease without esophagitis: Secondary | ICD-10-CM | POA: Diagnosis not present

## 2020-04-28 DIAGNOSIS — E875 Hyperkalemia: Secondary | ICD-10-CM | POA: Diagnosis not present

## 2020-04-28 DIAGNOSIS — I4510 Unspecified right bundle-branch block: Secondary | ICD-10-CM | POA: Diagnosis not present

## 2020-04-28 DIAGNOSIS — R4182 Altered mental status, unspecified: Secondary | ICD-10-CM | POA: Diagnosis not present

## 2020-04-28 DIAGNOSIS — N182 Chronic kidney disease, stage 2 (mild): Secondary | ICD-10-CM | POA: Diagnosis present

## 2020-04-28 DIAGNOSIS — L89152 Pressure ulcer of sacral region, stage 2: Secondary | ICD-10-CM | POA: Diagnosis not present

## 2020-04-28 DIAGNOSIS — J690 Pneumonitis due to inhalation of food and vomit: Secondary | ICD-10-CM | POA: Diagnosis present

## 2020-04-28 DIAGNOSIS — A419 Sepsis, unspecified organism: Secondary | ICD-10-CM | POA: Diagnosis present

## 2020-04-28 DIAGNOSIS — Z794 Long term (current) use of insulin: Secondary | ICD-10-CM | POA: Diagnosis not present

## 2020-04-28 DIAGNOSIS — E874 Mixed disorder of acid-base balance: Secondary | ICD-10-CM | POA: Diagnosis present

## 2020-04-28 DIAGNOSIS — J9600 Acute respiratory failure, unspecified whether with hypoxia or hypercapnia: Secondary | ICD-10-CM | POA: Diagnosis not present

## 2020-04-28 DIAGNOSIS — D696 Thrombocytopenia, unspecified: Secondary | ICD-10-CM | POA: Diagnosis present

## 2020-04-28 DIAGNOSIS — N390 Urinary tract infection, site not specified: Secondary | ICD-10-CM | POA: Diagnosis present

## 2020-04-28 DIAGNOSIS — E1169 Type 2 diabetes mellitus with other specified complication: Secondary | ICD-10-CM | POA: Diagnosis not present

## 2020-04-28 DIAGNOSIS — N200 Calculus of kidney: Secondary | ICD-10-CM | POA: Diagnosis not present

## 2020-04-28 DIAGNOSIS — R7989 Other specified abnormal findings of blood chemistry: Secondary | ICD-10-CM | POA: Diagnosis not present

## 2020-04-28 DIAGNOSIS — I130 Hypertensive heart and chronic kidney disease with heart failure and stage 1 through stage 4 chronic kidney disease, or unspecified chronic kidney disease: Secondary | ICD-10-CM | POA: Diagnosis present

## 2020-04-28 DIAGNOSIS — E869 Volume depletion, unspecified: Secondary | ICD-10-CM | POA: Diagnosis present

## 2020-04-28 DIAGNOSIS — R918 Other nonspecific abnormal finding of lung field: Secondary | ICD-10-CM | POA: Diagnosis not present

## 2020-04-28 DIAGNOSIS — M2550 Pain in unspecified joint: Secondary | ICD-10-CM | POA: Diagnosis not present

## 2020-04-28 DIAGNOSIS — I82441 Acute embolism and thrombosis of right tibial vein: Secondary | ICD-10-CM | POA: Diagnosis present

## 2020-04-28 DIAGNOSIS — A4153 Sepsis due to Serratia: Secondary | ICD-10-CM | POA: Diagnosis not present

## 2020-04-28 DIAGNOSIS — I5032 Chronic diastolic (congestive) heart failure: Secondary | ICD-10-CM | POA: Diagnosis present

## 2020-04-28 DIAGNOSIS — R609 Edema, unspecified: Secondary | ICD-10-CM | POA: Diagnosis not present

## 2020-04-28 DIAGNOSIS — R5383 Other fatigue: Secondary | ICD-10-CM | POA: Diagnosis not present

## 2020-04-28 DIAGNOSIS — R29810 Facial weakness: Secondary | ICD-10-CM | POA: Diagnosis not present

## 2020-04-28 DIAGNOSIS — Z20822 Contact with and (suspected) exposure to covid-19: Secondary | ICD-10-CM | POA: Diagnosis not present

## 2020-04-28 DIAGNOSIS — E1165 Type 2 diabetes mellitus with hyperglycemia: Secondary | ICD-10-CM | POA: Diagnosis not present

## 2020-04-28 DIAGNOSIS — J9602 Acute respiratory failure with hypercapnia: Secondary | ICD-10-CM | POA: Diagnosis present

## 2020-04-28 DIAGNOSIS — Z7189 Other specified counseling: Secondary | ICD-10-CM | POA: Diagnosis not present

## 2020-04-28 DIAGNOSIS — J9811 Atelectasis: Secondary | ICD-10-CM | POA: Diagnosis not present

## 2020-04-28 DIAGNOSIS — Z8546 Personal history of malignant neoplasm of prostate: Secondary | ICD-10-CM

## 2020-04-28 DIAGNOSIS — G4730 Sleep apnea, unspecified: Secondary | ICD-10-CM | POA: Diagnosis present

## 2020-04-28 DIAGNOSIS — R000 Tachycardia, unspecified: Secondary | ICD-10-CM | POA: Diagnosis not present

## 2020-04-28 DIAGNOSIS — I2609 Other pulmonary embolism with acute cor pulmonale: Secondary | ICD-10-CM | POA: Diagnosis not present

## 2020-04-28 DIAGNOSIS — I2510 Atherosclerotic heart disease of native coronary artery without angina pectoris: Secondary | ICD-10-CM | POA: Diagnosis not present

## 2020-04-28 DIAGNOSIS — G9341 Metabolic encephalopathy: Secondary | ICD-10-CM | POA: Diagnosis present

## 2020-04-28 DIAGNOSIS — R6520 Severe sepsis without septic shock: Secondary | ICD-10-CM | POA: Diagnosis present

## 2020-04-28 DIAGNOSIS — M6281 Muscle weakness (generalized): Secondary | ICD-10-CM | POA: Diagnosis not present

## 2020-04-28 DIAGNOSIS — R404 Transient alteration of awareness: Secondary | ICD-10-CM | POA: Diagnosis not present

## 2020-04-28 DIAGNOSIS — I5021 Acute systolic (congestive) heart failure: Secondary | ICD-10-CM | POA: Diagnosis not present

## 2020-04-28 DIAGNOSIS — J9601 Acute respiratory failure with hypoxia: Secondary | ICD-10-CM | POA: Diagnosis not present

## 2020-04-28 DIAGNOSIS — Z66 Do not resuscitate: Secondary | ICD-10-CM | POA: Diagnosis present

## 2020-04-28 DIAGNOSIS — E1122 Type 2 diabetes mellitus with diabetic chronic kidney disease: Secondary | ICD-10-CM | POA: Diagnosis present

## 2020-04-28 DIAGNOSIS — R627 Adult failure to thrive: Secondary | ICD-10-CM | POA: Diagnosis present

## 2020-04-28 DIAGNOSIS — Z7401 Bed confinement status: Secondary | ICD-10-CM | POA: Diagnosis not present

## 2020-04-28 DIAGNOSIS — I2699 Other pulmonary embolism without acute cor pulmonale: Secondary | ICD-10-CM | POA: Diagnosis not present

## 2020-04-28 DIAGNOSIS — Z6828 Body mass index (BMI) 28.0-28.9, adult: Secondary | ICD-10-CM

## 2020-04-28 DIAGNOSIS — E11649 Type 2 diabetes mellitus with hypoglycemia without coma: Secondary | ICD-10-CM | POA: Diagnosis not present

## 2020-04-28 DIAGNOSIS — R1312 Dysphagia, oropharyngeal phase: Secondary | ICD-10-CM | POA: Diagnosis present

## 2020-04-28 DIAGNOSIS — I10 Essential (primary) hypertension: Secondary | ICD-10-CM

## 2020-04-28 DIAGNOSIS — R531 Weakness: Secondary | ICD-10-CM | POA: Diagnosis not present

## 2020-04-28 DIAGNOSIS — R54 Age-related physical debility: Secondary | ICD-10-CM | POA: Diagnosis present

## 2020-04-28 DIAGNOSIS — R0602 Shortness of breath: Secondary | ICD-10-CM | POA: Diagnosis not present

## 2020-04-28 DIAGNOSIS — J156 Pneumonia due to other aerobic Gram-negative bacteria: Secondary | ICD-10-CM | POA: Diagnosis not present

## 2020-04-28 LAB — URINALYSIS, COMPLETE (UACMP) WITH MICROSCOPIC
Bilirubin Urine: NEGATIVE
Glucose, UA: NEGATIVE mg/dL
Hgb urine dipstick: NEGATIVE
Ketones, ur: NEGATIVE mg/dL
Nitrite: NEGATIVE
Protein, ur: 100 mg/dL — AB
Specific Gravity, Urine: 1.02 (ref 1.005–1.030)
WBC, UA: >50 WBC/hpf — ABNORMAL HIGH (ref 0–5)
pH: 5 (ref 5.0–8.0)

## 2020-04-28 LAB — CBC WITH DIFFERENTIAL/PLATELET
Abs Immature Granulocytes: 0.06 10*3/uL (ref 0.00–0.07)
Basophils Absolute: 0 10*3/uL (ref 0.0–0.1)
Basophils Relative: 0 %
Eosinophils Absolute: 0 10*3/uL (ref 0.0–0.5)
Eosinophils Relative: 0 %
HCT: 44.1 % (ref 39.0–52.0)
Hemoglobin: 13.4 g/dL (ref 13.0–17.0)
Immature Granulocytes: 1 %
Lymphocytes Relative: 30 %
Lymphs Abs: 3.8 10*3/uL (ref 0.7–4.0)
MCH: 28.5 pg (ref 26.0–34.0)
MCHC: 30.4 g/dL (ref 30.0–36.0)
MCV: 93.6 fL (ref 80.0–100.0)
Monocytes Absolute: 0.6 10*3/uL (ref 0.1–1.0)
Monocytes Relative: 5 %
Neutro Abs: 8.3 10*3/uL — ABNORMAL HIGH (ref 1.7–7.7)
Neutrophils Relative %: 64 %
Platelets: 221 10*3/uL (ref 150–400)
RBC: 4.71 MIL/uL (ref 4.22–5.81)
RDW: 15.1 % (ref 11.5–15.5)
WBC: 12.9 10*3/uL — ABNORMAL HIGH (ref 4.0–10.5)
nRBC: 0 % (ref 0.0–0.2)

## 2020-04-28 LAB — COMPREHENSIVE METABOLIC PANEL
ALT: 51 U/L — ABNORMAL HIGH (ref 0–44)
AST: 48 U/L — ABNORMAL HIGH (ref 15–41)
Albumin: 3.9 g/dL (ref 3.5–5.0)
Alkaline Phosphatase: 100 U/L (ref 38–126)
Anion gap: 12 (ref 5–15)
BUN: 23 mg/dL (ref 8–23)
CO2: 37 mmol/L — ABNORMAL HIGH (ref 22–32)
Calcium: 9.4 mg/dL (ref 8.9–10.3)
Chloride: 88 mmol/L — ABNORMAL LOW (ref 98–111)
Creatinine, Ser: 1.99 mg/dL — ABNORMAL HIGH (ref 0.61–1.24)
GFR calc Af Amer: 37 mL/min — ABNORMAL LOW (ref >60)
GFR calc non Af Amer: 32 mL/min — ABNORMAL LOW (ref >60)
Glucose, Bld: 286 mg/dL — ABNORMAL HIGH (ref 70–99)
Potassium: 5.3 mmol/L — ABNORMAL HIGH (ref 3.5–5.1)
Sodium: 137 mmol/L (ref 135–145)
Total Bilirubin: 0.9 mg/dL (ref 0.3–1.2)
Total Protein: 7.2 g/dL (ref 6.5–8.1)

## 2020-04-28 LAB — PROTIME-INR
INR: 0.9 (ref 0.8–1.2)
Prothrombin Time: 12 seconds (ref 11.4–15.2)

## 2020-04-28 LAB — RENAL FUNCTION PANEL
Albumin: 3.4 g/dL — ABNORMAL LOW (ref 3.5–5.0)
Anion gap: 10 (ref 5–15)
BUN: 26 mg/dL — ABNORMAL HIGH (ref 8–23)
CO2: 38 mmol/L — ABNORMAL HIGH (ref 22–32)
Calcium: 9 mg/dL (ref 8.9–10.3)
Chloride: 90 mmol/L — ABNORMAL LOW (ref 98–111)
Creatinine, Ser: 2.12 mg/dL — ABNORMAL HIGH (ref 0.61–1.24)
GFR calc Af Amer: 35 mL/min — ABNORMAL LOW (ref >60)
GFR calc non Af Amer: 30 mL/min — ABNORMAL LOW (ref >60)
Glucose, Bld: 248 mg/dL — ABNORMAL HIGH (ref 70–99)
Phosphorus: 7.1 mg/dL — ABNORMAL HIGH (ref 2.5–4.6)
Potassium: 5.1 mmol/L (ref 3.5–5.1)
Sodium: 138 mmol/L (ref 135–145)

## 2020-04-28 LAB — BLOOD GAS, ARTERIAL
Acid-Base Excess: 7.8 mmol/L — ABNORMAL HIGH (ref 0.0–2.0)
Acid-Base Excess: 8.6 mmol/L — ABNORMAL HIGH (ref 0.0–2.0)
Bicarbonate: 40.5 mmol/L — ABNORMAL HIGH (ref 20.0–28.0)
Bicarbonate: 39.4 mmol/L — ABNORMAL HIGH (ref 20.0–28.0)
Drawn by: 441261
O2 Saturation: 95.3 %
O2 Saturation: 92.8 %
Patient temperature: 98.6
Patient temperature: 98.6
pCO2 arterial: 117 mmHg (ref 32.0–48.0)
pCO2 arterial: 96.4 mmHg (ref 32.0–48.0)
pH, Arterial: 7.164 — CL (ref 7.350–7.450)
pH, Arterial: 7.235 — ABNORMAL LOW (ref 7.350–7.450)
pO2, Arterial: 102 mmHg (ref 83.0–108.0)
pO2, Arterial: 77.8 mmHg — ABNORMAL LOW (ref 83.0–108.0)

## 2020-04-28 LAB — APTT: aPTT: 27 seconds (ref 24–36)

## 2020-04-28 LAB — BLOOD GAS, VENOUS
Acid-Base Excess: 7 mmol/L — ABNORMAL HIGH (ref 0.0–2.0)
Bicarbonate: 39.9 mmol/L — ABNORMAL HIGH (ref 20.0–28.0)
O2 Saturation: 56.7 %
Patient temperature: 98.6
pCO2, Ven: 113 mmHg (ref 44.0–60.0)
pH, Ven: 7.176 — CL (ref 7.250–7.430)
pO2, Ven: 42.4 mmHg (ref 32.0–45.0)

## 2020-04-28 LAB — I-STAT CHEM 8, ED
BUN: 29 mg/dL — ABNORMAL HIGH (ref 8–23)
Calcium, Ion: 1.17 mmol/L (ref 1.15–1.40)
Chloride: 89 mmol/L — ABNORMAL LOW (ref 98–111)
Creatinine, Ser: 2.1 mg/dL — ABNORMAL HIGH (ref 0.61–1.24)
Glucose, Bld: 273 mg/dL — ABNORMAL HIGH (ref 70–99)
HCT: 43 % (ref 39.0–52.0)
Hemoglobin: 14.6 g/dL (ref 13.0–17.0)
Potassium: 5.3 mmol/L — ABNORMAL HIGH (ref 3.5–5.1)
Sodium: 134 mmol/L — ABNORMAL LOW (ref 135–145)
TCO2: 40 mmol/L — ABNORMAL HIGH (ref 22–32)

## 2020-04-28 LAB — RAPID URINE DRUG SCREEN, HOSP PERFORMED
Amphetamines: NOT DETECTED
Barbiturates: NOT DETECTED
Benzodiazepines: NOT DETECTED
Cocaine: NOT DETECTED
Opiates: POSITIVE — AB
Tetrahydrocannabinol: NOT DETECTED

## 2020-04-28 LAB — RESPIRATORY PANEL BY RT PCR (FLU A&B, COVID)
Influenza A by PCR: NEGATIVE
Influenza B by PCR: NEGATIVE
SARS Coronavirus 2 by RT PCR: NEGATIVE

## 2020-04-28 LAB — CBG MONITORING, ED
Glucose-Capillary: 243 mg/dL — ABNORMAL HIGH (ref 70–99)
Glucose-Capillary: 248 mg/dL — ABNORMAL HIGH (ref 70–99)

## 2020-04-28 LAB — LACTIC ACID, PLASMA
Lactic Acid, Venous: 1.8 mmol/L (ref 0.5–1.9)
Lactic Acid, Venous: 1.4 mmol/L (ref 0.5–1.9)

## 2020-04-28 LAB — TROPONIN I (HIGH SENSITIVITY): Troponin I (High Sensitivity): 61 ng/L — ABNORMAL HIGH (ref <18)

## 2020-04-28 LAB — VITAMIN B12: Vitamin B-12: 626 pg/mL (ref 180–914)

## 2020-04-28 LAB — ETHANOL: Alcohol, Ethyl (B): <10 mg/dL (ref <10)

## 2020-04-28 LAB — MAGNESIUM: Magnesium: 1.5 mg/dL — ABNORMAL LOW (ref 1.7–2.4)

## 2020-04-28 LAB — POC SARS CORONAVIRUS 2 AG -  ED: SARS Coronavirus 2 Ag: NEGATIVE

## 2020-04-28 LAB — AMMONIA: Ammonia: 12 umol/L (ref 9–35)

## 2020-04-28 LAB — BRAIN NATRIURETIC PEPTIDE: B Natriuretic Peptide: 327.9 pg/mL — ABNORMAL HIGH (ref 0.0–100.0)

## 2020-04-28 LAB — FOLATE: Folate: 13.5 ng/mL (ref >5.9)

## 2020-04-28 LAB — D-DIMER, QUANTITATIVE: D-Dimer, Quant: 1.76 ug/mL-FEU — ABNORMAL HIGH (ref 0.00–0.50)

## 2020-04-28 MED ORDER — LACTATED RINGERS IV BOLUS
1000.0000 mL | Freq: Once | INTRAVENOUS | Status: AC
  Administered 2020-04-28: 1000 mL via INTRAVENOUS

## 2020-04-28 MED ORDER — IPRATROPIUM-ALBUTEROL 0.5-2.5 (3) MG/3ML IN SOLN
3.0000 mL | Freq: Once | RESPIRATORY_TRACT | Status: AC
  Administered 2020-04-28: 3 mL via RESPIRATORY_TRACT

## 2020-04-28 MED ORDER — ENOXAPARIN SODIUM 40 MG/0.4ML ~~LOC~~ SOLN
40.0000 mg | SUBCUTANEOUS | Status: DC
  Administered 2020-04-28: 40 mg via SUBCUTANEOUS
  Filled 2020-04-28: qty 0.4

## 2020-04-28 MED ORDER — INSULIN GLARGINE 100 UNIT/ML ~~LOC~~ SOLN
10.0000 [IU] | Freq: Every day | SUBCUTANEOUS | Status: DC
  Administered 2020-04-29: 10 [IU] via SUBCUTANEOUS
  Filled 2020-04-28: qty 0.1

## 2020-04-28 MED ORDER — METHYLPREDNISOLONE SODIUM SUCC 125 MG IJ SOLR
125.0000 mg | Freq: Once | INTRAMUSCULAR | Status: AC
  Administered 2020-04-28: 125 mg via INTRAVENOUS
  Filled 2020-04-28: qty 2

## 2020-04-28 MED ORDER — HYDRALAZINE HCL 20 MG/ML IJ SOLN: 10.0000 mg | Freq: Four times a day (QID) | INTRAMUSCULAR | Status: DC | PRN

## 2020-04-28 MED ORDER — PANTOPRAZOLE SODIUM 40 MG IV SOLR
40.0000 mg | Freq: Two times a day (BID) | INTRAVENOUS | Status: DC
  Administered 2020-04-28 – 2020-05-03 (×11): 40 mg via INTRAVENOUS
  Filled 2020-04-28 (×11): qty 40

## 2020-04-28 MED ORDER — ACETAMINOPHEN 325 MG PO TABS
650.0000 mg | ORAL_TABLET | Freq: Four times a day (QID) | ORAL | Status: DC | PRN
  Filled 2020-04-28: qty 2

## 2020-04-28 MED ORDER — SODIUM CHLORIDE 0.9 % IV SOLN
1.0000 g | INTRAVENOUS | Status: AC
  Administered 2020-04-28 – 2020-05-02 (×5): 1 g via INTRAVENOUS
  Filled 2020-04-28: qty 1
  Filled 2020-04-28 (×4): qty 10

## 2020-04-28 MED ORDER — ONDANSETRON HCL 4 MG/2ML IJ SOLN
4.0000 mg | Freq: Four times a day (QID) | INTRAMUSCULAR | Status: DC | PRN
  Administered 2020-05-04: 4 mg via INTRAVENOUS
  Filled 2020-04-28: qty 2

## 2020-04-28 MED ORDER — ALBUTEROL SULFATE HFA 108 (90 BASE) MCG/ACT IN AERS
8.0000 | INHALATION_SPRAY | Freq: Once | RESPIRATORY_TRACT | Status: DC
  Filled 2020-04-28: qty 6.7

## 2020-04-28 MED ORDER — IPRATROPIUM-ALBUTEROL 0.5-2.5 (3) MG/3ML IN SOLN
RESPIRATORY_TRACT | Status: AC
  Administered 2020-04-28: 3 mL
  Filled 2020-04-28: qty 3

## 2020-04-28 MED ORDER — LACTATED RINGERS IV SOLN: INTRAVENOUS | Status: AC

## 2020-04-28 MED ORDER — INSULIN ASPART 100 UNIT/ML ~~LOC~~ SOLN
0.0000 [IU] | SUBCUTANEOUS | Status: DC
  Administered 2020-04-28: 7 [IU] via SUBCUTANEOUS
  Administered 2020-04-29 (×2): 4 [IU] via SUBCUTANEOUS
  Filled 2020-04-28: qty 0.2

## 2020-04-28 MED ORDER — MAGNESIUM SULFATE 2 GM/50ML IV SOLN
2.0000 g | Freq: Once | INTRAVENOUS | Status: AC
  Administered 2020-04-28: 2 g via INTRAVENOUS
  Filled 2020-04-28: qty 50

## 2020-04-28 MED ORDER — ALBUTEROL SULFATE (2.5 MG/3ML) 0.083% IN NEBU
INHALATION_SOLUTION | RESPIRATORY_TRACT | Status: AC
  Filled 2020-04-28: qty 3

## 2020-04-28 MED ORDER — ONDANSETRON HCL 4 MG PO TABS: 4.0000 mg | ORAL_TABLET | Freq: Four times a day (QID) | ORAL | Status: DC | PRN

## 2020-04-28 MED ORDER — SODIUM CHLORIDE 0.9 % IV SOLN
500.0000 mg | INTRAVENOUS | Status: AC
  Administered 2020-04-29 – 2020-05-03 (×5): 500 mg via INTRAVENOUS
  Filled 2020-04-28 (×5): qty 500

## 2020-04-28 MED ORDER — ALBUTEROL SULFATE (2.5 MG/3ML) 0.083% IN NEBU
2.5000 mg | INHALATION_SOLUTION | Freq: Four times a day (QID) | RESPIRATORY_TRACT | Status: DC
  Administered 2020-04-28: 2.5 mg via RESPIRATORY_TRACT

## 2020-04-28 MED ORDER — ACETAMINOPHEN 650 MG RE SUPP
650.0000 mg | Freq: Four times a day (QID) | RECTAL | Status: DC | PRN
  Administered 2020-05-01: 650 mg via RECTAL
  Filled 2020-04-28 (×2): qty 1

## 2020-04-28 MED ORDER — ALBUTEROL SULFATE (2.5 MG/3ML) 0.083% IN NEBU: 2.5000 mg | INHALATION_SOLUTION | RESPIRATORY_TRACT | Status: DC | PRN

## 2020-04-28 MED ORDER — NALOXONE HCL 0.4 MG/ML IJ SOLN
0.4000 mg | Freq: Once | INTRAMUSCULAR | Status: AC
  Administered 2020-04-28: 0.4 mg via INTRAVENOUS
  Filled 2020-04-28: qty 1

## 2020-04-28 NOTE — ED Triage Notes (Signed)
Pt BIB EMS from [facility]. Staff called out due to pt being more weak and lethargic. Staff put pt on non-rebreather due to his SpO2 reading "37%". EMS arrived and placed pt on 2L [place].   100% 2L BP 100/60 HR 104 CBG 355

## 2020-04-28 NOTE — Consult Note (Signed)
   NAME:  Jay Holloway, MRN:  [id number], DOB:  [birth year], LOS: 0 ADMISSION DATE:  04/28/2020, CONSULTATION DATE: 04/28/2020 REFERRING MD: Dr. Karle Starch, CHIEF COMPLAINT: Hypercapnic respiratory failure  Brief History   Elderly gentleman brought into the hospital for shortness of breath, altered mentation Somnolent at presentation Was placed on BiPAP but kept repeating BiPAP off Placed on oxygen supplementation  Information obtained from family by ED from nephew noted that patient is DNR and during a recent hospitalization was DNR/DNI  History of present illness   Resides in a nursing facility, brought in for altered level of consciousness More somnolent at the nursing facility Was recently discharged about a week ago  Past Medical History   Past Medical History:  Diagnosis Date  . Arthritis    trouble turning head side to side and up  . Diabetes mellitus without complication ([place])   . Hemorrhoids   . High cholesterol   . Hypertension   . Prostate cancer ([place])    Significant Hospital Events   10/1-intolerance to BiPAP  Consults:  PCCM  Procedures:  None  Significant Diagnostic Tests:  ABG-7.1 6/117/102 CT head with no acute abnormality Chest x-ray 04/28/2020 reviewed by myself shows no infiltrative process Micro Data:  None  Antimicrobials:  None  Interim history/subjective:  Patient tries to interact but falls asleep almost immediately  Objective   Blood pressure 99/68, pulse 90, temperature 98.6 F (37 C), temperature source Oral, resp. rate 15, height 6' (1.829 m), weight 96.4 kg, SpO2 95 %.    FiO2 (%):  [40 %] 40 %  No intake or output data in the 24 hours ending 04/28/20 1732 Filed Weights   04/28/20 1143  Weight: 96.4 kg    Examination: General: Elderly gentleman, somnolent, poor dentition HENT: Dry oral mucosa Lungs: Decreased air movement bilaterally Cardiovascular: S1-S2 appreciated Abdomen: Bowel sounds appreciated Extremities: No  clubbing Neuro: Somnolent GU:   Resolved Hospital Problem list     Assessment & Plan:  Hypercapnic respiratory failure -Patient's PCO2 is significantly high at present and may be responsible for his altered mentation -Keep him on oxygen supplementation and keep saturations just about 88% -BiPAP if tolerated, patient should not be restrained to apply BiPAP -We will not be appropriate to intubate and ventilate patient if he does not want to be intubated and ventilated -This may mean that he may die from the current process -BiPAP may be offered at any time if he chooses to leave it on, this can be titrated to comfort but will however not restrain a patient to leave BiPAP on  History of chronic kidney disease -Continue to trend electrolytes  History of prostate cancer  History of diabetes -Diet -Continue medications  Failure to thrive  History of diastolic heart failure -Hold diuretics at present and trend electrolytes  -Avoid any medications that may contribute to sleepiness  Palliative medicine may be consulted to assist with transitioning to comfort measures  Called nephew Jay Holloway, left message  Spoke with friend who will contact nephew and let us know how to proceed  He does not appear to have an infection at the present time to account for his decompensation

## 2020-04-28 NOTE — H&P (Addendum)
History and Physical    Jay Holloway ZOX:[id number] DOB: May 26, 1946 DOA: 04/28/2020  PCP: [name], MD  Patient coming from: SNF - [facility]   Chief Complaint:  Chief Complaint  Patient presents with  . Shortness of Breath  . Altered Mental Status     HPI:    74 year old male with past medical history of prostate cancer status post radical prostatectomy, insulin-dependent diabetes mellitus type 2, hypertension, gastroesophageal reflux disease, questionable history of COPD (no PFT's on file), questionable history of diastolic ingestive heart failure (no Echocardiogram on file) and recent hospitalization for acute metabolic encephalopathy and acute hypercapnic/hypoxic respiratory failure who presents to [facility] long hospital emergency department from his skilled nursing facility with severe somnolence and hypoxia.  Patient is unable to provide a history due to severe encephalopathy.  Of note, patient has no spouse or children.  Emergency department staff has corresponded with the nephew as next of kin on 2 occasions today although he does not possess healthcare power of attorney.  Of note, patient was recently discharged from [facility] after being treated from 9/17 to 9/22.  Patient had presented with severe generalized weakness found to be in mild DKA with hypercapnic/hypoxic respiratory failure and encephalopathy.  Patient was also found to have acute kidney injury.  DKA had resolved as well as the patient's respiratory failure and encephalopathy and the patient was discharged to a skilled nursing facility.  At that time there was question as to whether or not the patient had a history of COPD or diastolic congestive heart failure although there is no PFT or echocardiogram on file.  Today, the patient is being brought in by EMS from [facility] due to progressively worsening weakness, lethargy and hypoxia documented to be as low as 37%.  Upon evaluation in the emergency  department, due to patient's severe hypercapnic respiratory failure with associated respiratory acidosis with pH of 7.164 and PCO2 117, critical care was consulted.  Due to the fact the patient was a DNR and DNI it was recommended that the patient be admitted to the medical service to the progressive/stepdown unit.  BiPAP was attempted throughout the emergency department stay hampered by the patient's repeated attempts to remove the mass.  The hospitalist group was called to assess the patient for admission to the hospital.   Review of Systems:   Review of Systems  Unable to perform ROS: Mental acuity    Past Medical History:  Diagnosis Date  . Arthritis    trouble turning head side to side and up  . Diabetes mellitus without complication ([place])   . Hemorrhoids   . High cholesterol   . Hypertension   . Prostate cancer [facility])     Past Surgical History:  Procedure Laterality Date  . CHOLECYSTECTOMY N/A 11/11/2014   Procedure: LAPAROSCOPIC CHOLECYSTECTOMY WITH INTRAOPERATIVE CHOLANGIOGRAM;  Surgeon: Erroll Luna, MD;  Location: [place];  Service: General;  Laterality: N/A;  . COLONOSCOPY N/A 11/09/2014   Procedure: COLONOSCOPY;  Surgeon: Juanita Craver, MD;  Location: [facility] ENDOSCOPY;  Service: Endoscopy;  Laterality: N/A;  . CYSTOSCOPY WITH RETROGRADE PYELOGRAM, URETEROSCOPY AND STENT PLACEMENT Left 04/18/2014   Procedure: CYSTOSCOPY WITH RETROGRADE PYELOGRAM, URETEROSCOPY AND STENT PLACEMENT;  Surgeon: Raynelle Bring, MD;  Location: [facility];  Service: Urology;  Laterality: Left;  . HOLMIUM LASER APPLICATION Left 03/08/9146   Procedure: HOLMIUM LASER APPLICATION;  Surgeon: Raynelle Bring, MD;  Location: [facility];  Service: Urology;  Laterality: Left;  . LYMPHADENECTOMY  06/04/2012   Procedure:  LYMPHADENECTOMY;  Surgeon: Dutch Gray, MD;  Location: [facility];  Service: Urology;  Laterality: Bilateral;  . right  achilles tendon repair  yrs ago  . ROBOT ASSISTED LAPAROSCOPIC RADICAL PROSTATECTOMY  06/04/2012    Procedure: ROBOTIC ASSISTED LAPAROSCOPIC RADICAL PROSTATECTOMY LEVEL 3;  Surgeon: Dutch Gray, MD;  Location: [facility];  Service: Urology;  Laterality: N/A;     reports that he quit smoking about 41 years ago. His smoking use included cigarettes. He has a 1.00 pack-year smoking history. He has never used smokeless tobacco. He reports previous alcohol use. He reports previous drug use. Drug: Marijuana.  No Known Allergies  Family History  Family history unknown: Yes     Prior to Admission medications   Medication Sig Start Date End Date Taking? Authorizing Provider  atorvastatin (LIPITOR) 10 MG tablet Take 10 mg by mouth daily.  04/21/18  Yes [provider]  carvedilol (COREG) 6.25 MG tablet Take 1 tablet (6.25 mg total) by mouth 2 (two) times daily with a meal. 04/19/20  Yes Annita Brod, MD  Cholecalciferol (VITAMIN D3) 25 MCG (1000 UT) CAPS Take 1,000 Units by mouth daily.  10/15/18  Yes [provider]  feeding supplement, GLUCERNA SHAKE, (GLUCERNA SHAKE) LIQD Take 237 mLs by mouth 3 (three) times daily between meals.   Yes [provider]  furosemide (LASIX) 20 MG tablet Take 20 mg by mouth daily as needed for edema.   Yes [provider]  HYDROcodone-acetaminophen (NORCO) 7.5-325 MG tablet Take 1 tablet by mouth See admin instructions. 1 tablet three times daily And 1 tablet every 8 hours as needed for chronic pain   Yes [provider]  insulin glargine (LANTUS) 100 UNIT/ML injection Inject 0.1 mLs (10 Units total) into the skin daily. Give only 5 units for am glucose less than 90. 03/25/20  Yes Swayze, Ava, DO  loperamide (IMODIUM) 2 MG capsule Take 2 mg by mouth See admin instructions. 2mg  daily  And 2mg  every 4 hours as needed for diarrhea 04/28/20 04/30/20 Yes [provider]  melatonin 3 MG TABS tablet Take 1 tablet (3 mg total) by mouth at bedtime. 03/24/20  Yes Swayze, Ava, DO  metFORMIN (GLUCOPHAGE-XR) 500 MG 24 hr tablet Take  500 mg by mouth in the morning and at bedtime.  10/15/18  Yes [provider]  Multiple Vitamins-Minerals (MULTIVITAMIN ADULTS) TABS Take 1 tablet by mouth daily.   Yes [provider]  ondansetron (ZOFRAN) 4 MG tablet Take 4 mg by mouth 3 (three) times daily before meals. 04/26/20 04/29/20 Yes [provider]  pantoprazole (PROTONIX) 40 MG tablet Take 1 tablet (40 mg total) by mouth 2 (two) times daily. 03/24/20  Yes Swayze, Ava, DO  potassium chloride 20 MEQ TBCR Take 20 mEq by mouth daily. 03/24/20 04/28/20 Yes Swayze, Ava, DO  Semaglutide (OZEMPIC, 1 MG/DOSE, [place]) Inject 1 mg into the skin once a week. Wed   Yes [provider]  collagenase (SANTYL) ointment Apply topically daily. Apply Santyl to coccyx wound in a thick layer. Cover with a saline moistened gauze, then dry gauze..  Change daily. Patient not taking: Reported on 04/28/2020 04/20/20   Annita Brod, MD  furosemide (LASIX) 40 MG tablet Take 0.5 tablets (20 mg total) by mouth daily. fluid Patient not taking: Reported on 04/28/2020 03/24/20   Karie Kirks, DO    Physical Exam: Vitals:   04/28/20 1815 04/28/20 1830 04/28/20 1900 04/28/20 1915  BP: 110/64 (!) 98/58 105/67 (!) 16/60  Pulse: 93 93  95  Resp: 16 (!) 27 19 20   Temp:      TempSrc:      SpO2: 96% 94%  98%  Weight:      Height:        Constitutional: Patient is notably cachectic.  Patient is extremely lethargic and somewhat arousable to both verbal and painful stimuli.  patient is disoriented.  Patient is not in any acute distress.   Skin: Notably thickened skin suggestive of hyperkeratosis of the distal bilateral lower extremities.   extremely poor skin turgor noted.  1 cm diameter stage II pressure ulceration noted within the sacral area/gluteal cleft present on admission. Eyes: Pupils are equally reactive to light.  No evidence of scleral icterus or conjunctival pallor.  ENMT: Extremely dry mucous membranes.  Poor dentition noted.   Posterior pharynx clear of any exudate or lesions.   Neck: normal, supple, no masses, no thyromegaly.  No evidence of jugular venous distension.   Respiratory: Scattered rhonchi bilaterally without any evidence of wheezing or rales.  Normal respiratory effort. No accessory muscle use.  Cardiovascular: Regular rate and rhythm, no murmurs / rubs / gallops. No extremity edema. 2+ pedal pulses. No carotid bruits.  Chest:   Nontender without crepitus or deformity.   Back:   Nontender without crepitus or deformity. Abdomen: Abdomen is soft and nontender.  Sagging of the abdominal skin is suggestive of rapid weight loss.  No obvious evidence of intra-abdominal masses.  Positive bowel sounds noted in all quadrants.   Musculoskeletal: Poor muscle tone noted.  No joint deformity upper and lower extremities. Good ROM, no contractures.  Neurologic: Patient is extremely lethargic but arousable to both verbal and painful stimuli.  Patient does localize to pain.  Patient is not consistently following commands.  Patient is able to move all 4 extremity spontaneously however.   Psychiatric: Unable to assess due to severe lethargy, patient currently does not seem to possess insight as to his current situation.   Labs on Admission: I have personally reviewed following labs and imaging studies -   CBC: Recent Labs  Lab 04/28/20 1215 04/28/20 1333  WBC 12.9*  --   NEUTROABS 8.3*  --   HGB 13.4 14.6  HCT 44.1 43.0  MCV 93.6  --   PLT 221  --    Basic Metabolic Panel: Recent Labs  Lab 04/28/20 1215 04/28/20 1236 04/28/20 1333  NA 137  --  134*  K 5.3*  --  5.3*  CL 88*  --  89*  CO2 37*  --   --   GLUCOSE 286*  --  273*  BUN 23  --  29*  CREATININE 1.99*  --  2.10*  CALCIUM 9.4  --   --   MG  --  1.5*  --    GFR: Estimated Creatinine Clearance: 37.7 mL/min (A) (by C-G formula based on SCr of 2.1 mg/dL (H)). Liver Function Tests: Recent Labs  Lab 04/28/20 1215  AST 48*  ALT 51*  ALKPHOS 100   BILITOT 0.9  PROT 7.2  ALBUMIN 3.9   No results for input(s): LIPASE, AMYLASE in the last 168 hours. Recent Labs  Lab 04/28/20 1236  AMMONIA 12   Coagulation Profile: No results for input(s): INR, PROTIME in the last 168 hours. Cardiac Enzymes: No results for input(s): CKTOTAL, CKMB, CKMBINDEX, TROPONINI in the last 168 hours. BNP (last 3 results) No results for input(s): PROBNP in the last 8760 hours. HbA1C: No results for  input(s): HGBA1C in the last 72 hours. CBG: Recent Labs  Lab 04/28/20 1320  GLUCAP 243*   Lipid Profile: No results for input(s): CHOL, HDL, LDLCALC, TRIG, CHOLHDL, LDLDIRECT in the last 72 hours. Thyroid Function Tests: No results for input(s): TSH, T4TOTAL, FREET4, T3FREE, THYROIDAB in the last 72 hours. Anemia Panel: No results for input(s): VITAMINB12, FOLATE, FERRITIN, TIBC, IRON, RETICCTPCT in the last 72 hours. Urine analysis:    Component Value Date/Time   COLORURINE YELLOW 04/14/2020 [place] 04/14/2020 1743   LABSPEC 1.015 04/14/2020 1743   PHURINE 5.0 04/14/2020 1743   GLUCOSEU >=500 (A) 04/14/2020 1743   HGBUR NEGATIVE 04/14/2020 1743   BILIRUBINUR NEGATIVE 04/14/2020 1743   KETONESUR 80 (A) 04/14/2020 1743   PROTEINUR 30 (A) 04/14/2020 1743   UROBILINOGEN 0.2 11/05/2014 1712   NITRITE NEGATIVE 04/14/2020 1743   LEUKOCYTESUR NEGATIVE 04/14/2020 1743    Radiological Exams on Admission - Personally Reviewed: CT Head Wo Contrast  Result Date: 04/28/2020 CLINICAL DATA:  Altered mental status. EXAM: CT HEAD WITHOUT CONTRAST TECHNIQUE: Contiguous axial images were obtained from the base of the skull through the vertex without intravenous contrast. COMPARISON:  CT head 04/14/2020 FINDINGS: Brain: No evidence of acute infarction, hemorrhage, hydrocephalus, extra-axial collection or mass lesion/mass effect. Mild atrophy. Mild white matter changes consistent with chronic microvascular ischemia. Vascular: Negative for  hyperdense vessel Skull: Negative Sinuses/Orbits: Paranasal sinuses clear.  Negative orbit. Other: None IMPRESSION: No acute abnormality. Electronically Signed   By: Franchot Gallo M.D.   On: 04/28/2020 13:18   DG Chest Portable 1 View  Result Date: 04/28/2020 CLINICAL DATA:  Hypoxia EXAM: PORTABLE CHEST 1 VIEW COMPARISON:  04/14/2020 FINDINGS: The heart size and mediastinal contours are within normal limits. Atherosclerotic calcification of the aortic knob. Unchanged mild elevation of the right hemidiaphragm with minimal right basilar atelectasis. Lungs appear otherwise clear. No pleural effusion or pneumothorax. The visualized skeletal structures are unremarkable. IMPRESSION: No active disease. Electronically Signed   By: Davina Poke D.O.   On: 04/28/2020 13:09    EKG: Personally reviewed.  Rhythm is normal sinus rhythm with heart rate of 99 bpm.  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 No dynamic ST segment changes appreciated when compared to prior EKG.  Assessment/Plan Principal Problem:   Acute respiratory failure with hypoxia and hypercarbia [facility])   Patient presenting with progressively worsening lethargy, weakness and hypoxia from the skilled nursing facility.  Additional details are scarce as to the time course  Patient apparently had a similar but less severe episode several weeks ago during his most recent hospitalization from 9/17-9/21.  This particular episode is likely multifactorial and not entirely clear at this point.  While patient is particularly hypercapnic this seems to primarily be due to the fact the patient is not exhibiting good tidal volume/minute ventilation with a relatively slow respiratory rate.   There is no evidence of wheezing, rales or volume overload.    Patient may be suffering from severe encephalopathy which is then secondarily leading to the suppression of his respiratory drive -I am particularly concerned about the  possibility of benzodiazepine/opiate ingestion or an underlying infectious process and dehydration that are contributing.  Patient is repeatedly attempting to pull off his BiPAP mask making it nearly impossible to achieve effective use of this device.  Furthermore patient has a well-documented DNR/DNI after a discussion with the patient when he had capacity to make his decision several weeks ago.  This has  been discussed additionally with the patient's next of kin by the emergency department staff and they are not in disagreement with this.  Patient was therefore transitioned off BiPAP to high flow nasal cannula and repeat ABG shows improvement with this method of oxygen delivery.  This will be continued and titrated based on serial ABGs as well as bedside oxygen saturations of between 92 and 96%.  Will provide patient with a trial dose of Narcan and observe whether patient clinically improves.  Obtaining urine toxicology screen.  Performing extensive work-up for possible underlying causes of encephalopathy.  Continue to provide patient with aggressive bronchodilator therapy although I doubt underlying COPD based on my lung exam.  Obtaining D-dimer, severe thromboembolic disease is unlikely but if D-dimer is markedly elevated will evaluate this possibility further.  Patient is already been evaluated by CCM and due to the patient's refusal to be full code they recommended admission to the stepdown unit or progressive care unit with other means of ventilatory support since mechanical ventilation is off the table.  Patient is Covid negative, no evidence of pneumonia on chest imaging  Overall prognosis is quite poor, I am concerned about the possibility of underlying malignancy (either progression of prostate or another primary) in this patient and patient will need to be pan scanned once more clinically stable.  Admitting to stepdown unit now.  Active Problems:   Acute toxic metabolic  encephalopathy   Obtaining CT head, TSH, folate, vitamin B12, ethanol level, urine toxicology screen, urinalysis  Concerning for underlying encephalopathy as a cause of patient's severe hypercapnia  Attempting trial of Narcan  Patient would certainly benefit from CT imaging of the chest abdomen pelvis to identify any underlying cause of malignancy once clinically more stable.  See remainder of assessment and plan above.  Close monitoring, currently n.p.o.    AKI (acute kidney injury) ([place])   Patient suffering from acute kidney injury, likely prerenal due to suspected poor oral intake and volume depletion  Patient suffering from associated mild hyperkalemia from reduced renal excretion and volume depletion.  Hydrating patient with intravenous isotonic fluids  Strict input and output monitoring  Monitoring renal function and electrolytes and serial chemistries     Type 2 diabetes mellitus with hyperglycemia, with long-term current use of insulin ([facility])   Accu-Cheks every 4 hours with sliding scale insulin while n.p.o.  Patient is quite hyperglycemic on arrival although not in DKA.  Suspect worsening of hyperglycemia with administration of steroids here in the emergency room  Continuing home regimen of low-dose Lantus every morning per home regimen with parameters not to administer if patient's sugars are less than 90 this can be discontinued at the discretion of the daytime provider based on what the glycemic control looks like.    Essential hypertension  Holding all scheduled oral antihypertensives for now  As needed intravenous antihypertensives are periods of excessively high blood pressure    Hyperkalemia   Mild hyperkalemia likely secondary to volume depletion and renal injury  No evidence of dynamic EKG changes  I suspect hyperkalemia will resolve with administration of fluids  Monitoring patient on telemetry  Monitoring serial chemistries    GERD without  esophagitis   Transition patient to intravenous PPI while n.p.o.    Pressure injury of sacral region, stage 2 ([place])   Relatively small 1 cm stage II wound  Will order Medihoney with Mepilex daily    Hypomagnesemia   Replace with intravenous magnesium sulfate  Monitoring magnesium levels with serial chemistries  Mixed diabetic hyperlipidemia associated with type 2 diabetes mellitus ([place])  Holding oral statin therapy while n.p.o.   Code Status:  DNR Family Communication: Alanson Puls is only known next of kin and has been updated on plan of care.  Status is: Inpatient  Remains inpatient appropriate because:Altered mental status, IV treatments appropriate due to intensity of illness or inability to take PO and Inpatient level of care appropriate due to severity of illness   Dispo: The patient is from: SNF              Anticipated d/c is to: SNF              Anticipated d/c date is: > 3 days              Patient currently is not medically stable to d/c.        Vernelle Emerald MD [facility] Pager [phone]  If 7PM-7AM, please contact night-coverage [email or web address] Use universal [place] password for that web site. If you do not have the password, please call the hospital operator.  04/28/2020, 7:39 PM

## 2020-04-28 NOTE — Progress Notes (Signed)
Checked up on the patient 1817 hrs.  Arousable, tries to interact, tries to answer questions  Repeat ABG at 1900 hrs.  Patient does have a DNR paper at bedside

## 2020-04-28 NOTE — ED Provider Notes (Signed)
[name] DEPT Provider Note   CSN: [id number] Arrival date & time: 04/28/20  1132     History Chief Complaint  Patient presents with  . Shortness of Breath  . Altered Mental Status    Jay Holloway is a 74 y.o. male with history of hypertension, hyperlipidemia, diabetes, prostate cancer s/p radical prostatectomy, CKD presents to the ER from nursing facility for altered mental status.  Level 5 caveat due to acuity of condition.  Patient's SPO2 was 44% on room air on arrival.  He is somnolent.  Will wake up with shoulder rub and say yes and no answers.  He denies pain to me.  When asked how he is feeling he tries to speak but speech is slurred and he falls back asleep.  Per RN the EMS reported he was noted to be more somnolent at nursing facility and transported here.  Reportedly SpO2 37% at facility.  He was placed on 2 L [place] and had SPO2 of 100%.  DNR at bedside.  Patient was recently discharged from hospital a week and a half ago.  We'll plan to contact facility and/or family members.  I obtained additional information from patient's nephew.  States he was not aware that patient was in ER. Cannot provide more history for HPI. Confirmed that patient is a DNR.  Nephew states they never really had a conversation about end of life goals.  He "thinks" patient would be open to chest compressions and cardiac medicines if his heart were to stop as well as intubation but he would not want to be "hooked on machines for a long time if he wasn't responding"  HPI     Past Medical History:  Diagnosis Date  . Arthritis    trouble turning head side to side and up  . Diabetes mellitus without complication ([place])   . Hemorrhoids   . High cholesterol   . Hypertension   . Prostate cancer [facility])     Patient Active Problem List   Diagnosis Date Noted  . Decubitus ulcer of sacral region, unstageable ([place]) 04/19/2020  . Acute diastolic CHF (congestive heart failure) ([place])  04/19/2020  . Acute respiratory failure with hypoxia and hypercarbia ([place]) 04/17/2020  . Hypernatremia 04/17/2020  . Dysphagia 04/17/2020  . Acute metabolic encephalopathy 23/53/6144  . CKD (chronic kidney disease), stage II 04/17/2020  . Hyperkalemia 04/14/2020  . GERD (gastroesophageal reflux disease) 04/14/2020  . Slurred speech 04/14/2020  . Generalized weakness 04/14/2020  . AKI (acute kidney injury) ([place]) 03/21/2020  . Prostate cancer ([place])   . Hypertension   . HLD (hyperlipidemia)   . Arterial hypotension   . Renal failure   . DM (diabetes mellitus) ([place])   . Weakness   . DKA (diabetic ketoacidoses) 11/05/2014    Past Surgical History:  Procedure Laterality Date  . CHOLECYSTECTOMY N/A 11/11/2014   Procedure: LAPAROSCOPIC CHOLECYSTECTOMY WITH INTRAOPERATIVE CHOLANGIOGRAM;  Surgeon: Erroll Luna, MD;  Location: [place];  Service: General;  Laterality: N/A;  . COLONOSCOPY N/A 11/09/2014   Procedure: COLONOSCOPY;  Surgeon: Juanita Craver, MD;  Location: [facility] ENDOSCOPY;  Service: Endoscopy;  Laterality: N/A;  . CYSTOSCOPY WITH RETROGRADE PYELOGRAM, URETEROSCOPY AND STENT PLACEMENT Left 04/18/2014   Procedure: CYSTOSCOPY WITH RETROGRADE PYELOGRAM, URETEROSCOPY AND STENT PLACEMENT;  Surgeon: Raynelle Bring, MD;  Location: [facility] ORS;  Service: Urology;  Laterality: Left;  . HOLMIUM LASER APPLICATION Left 10/11/4006   Procedure: HOLMIUM LASER APPLICATION;  Surgeon: Raynelle Bring, MD;  Location: [facility] ORS;  Service: Urology;  Laterality: Left;  . LYMPHADENECTOMY  06/04/2012   Procedure: LYMPHADENECTOMY;  Surgeon: Dutch Gray, MD;  Location: [facility] ORS;  Service: Urology;  Laterality: Bilateral;  . right  achilles tendon repair  yrs ago  . ROBOT ASSISTED LAPAROSCOPIC RADICAL PROSTATECTOMY  06/04/2012   Procedure: ROBOTIC ASSISTED LAPAROSCOPIC RADICAL PROSTATECTOMY LEVEL 3;  Surgeon: Dutch Gray, MD;  Location: [facility] ORS;  Service: Urology;  Laterality: N/A;       History reviewed. No pertinent family  history.  Social History   Tobacco Use  . Smoking status: Former Smoker    Packs/day: 0.25    Years: 4.00    Pack years: 1.00    Types: Cigarettes    Quit date: 12/16/1978    Years since quitting: 41.3  . Smokeless tobacco: Never Used  Substance Use Topics  . Alcohol use: Not Currently    Comment: remote h/o heavy use  . Drug use: Not Currently    Types: Marijuana    Comment: 1970s    Home Medications Prior to Admission medications   Medication Sig Start Date End Date Taking? Authorizing Provider  atorvastatin (LIPITOR) 10 MG tablet Take 10 mg by mouth daily.  04/21/18  Yes [provider]  carvedilol (COREG) 6.25 MG tablet Take 1 tablet (6.25 mg total) by mouth 2 (two) times daily with a meal. 04/19/20  Yes Annita Brod, MD  Cholecalciferol (VITAMIN D3) 25 MCG (1000 UT) CAPS Take 1,000 Units by mouth daily.  10/15/18  Yes [provider]  feeding supplement, GLUCERNA SHAKE, (GLUCERNA SHAKE) LIQD Take 237 mLs by mouth 3 (three) times daily between meals.   Yes [provider]  furosemide (LASIX) 20 MG tablet Take 20 mg by mouth daily as needed for edema.   Yes [provider]  HYDROcodone-acetaminophen (NORCO) 7.5-325 MG tablet Take 1 tablet by mouth See admin instructions. 1 tablet three times daily And 1 tablet every 8 hours as needed for chronic pain   Yes [provider]  insulin glargine (LANTUS) 100 UNIT/ML injection Inject 0.1 mLs (10 Units total) into the skin daily. Give only 5 units for am glucose less than 90. 03/25/20  Yes Swayze, Ava, DO  loperamide (IMODIUM) 2 MG capsule Take 2 mg by mouth See admin instructions. 2mg  daily  And 2mg  every 4 hours as needed for diarrhea 04/28/20 04/30/20 Yes [provider]  melatonin 3 MG TABS tablet Take 1 tablet (3 mg total) by mouth at bedtime. 03/24/20  Yes Swayze, Ava, DO  metFORMIN (GLUCOPHAGE-XR) 500 MG 24 hr tablet Take 500 mg by mouth in the morning and at bedtime.  10/15/18   Yes [provider]  Multiple Vitamins-Minerals (MULTIVITAMIN ADULTS) TABS Take 1 tablet by mouth daily.   Yes [provider]  ondansetron (ZOFRAN) 4 MG tablet Take 4 mg by mouth 3 (three) times daily before meals. 04/26/20 04/29/20 Yes [provider]  pantoprazole (PROTONIX) 40 MG tablet Take 1 tablet (40 mg total) by mouth 2 (two) times daily. 03/24/20  Yes Swayze, Ava, DO  potassium chloride 20 MEQ TBCR Take 20 mEq by mouth daily. 03/24/20 04/28/20 Yes Swayze, Ava, DO  Semaglutide (OZEMPIC, 1 MG/DOSE, [place]) Inject 1 mg into the skin once a week. Wed   Yes [provider]  collagenase (SANTYL) ointment Apply topically daily. Apply Santyl to coccyx wound in a thick layer. Cover with a saline moistened gauze, then dry gauze..  Change daily. Patient not taking: Reported on 04/28/2020 04/20/20   Gevena Barre  K, MD  furosemide (LASIX) 40 MG tablet Take 0.5 tablets (20 mg total) by mouth daily. fluid Patient not taking: Reported on 04/28/2020 03/24/20   Swayze, Ava, DO    Allergies    Patient has no known allergies.  Review of Systems   Review of Systems  Unable to perform ROS: Acuity of condition    Physical Exam Updated Vital Signs BP 93/81   Pulse 96   Temp 98.6 F (37 C) (Oral)   Resp 17   Ht 6' (1.829 m)   Wt 96.4 kg   SpO2 100%   BMI 28.82 kg/m   Physical Exam Constitutional:      Appearance: He is well-developed. He is ill-appearing.     Comments: Looks older than stated age.   HENT:     Head: Normocephalic.     Nose: Nose normal.     Mouth/Throat:     Comments: Extremely dry lips and moist mucous membranes Eyes:     General: Lids are normal.  Cardiovascular:     Rate and Rhythm: Tachycardia present.     Comments: Tachycardic heart rate in the low 100s.  Sounds regular.  No obvious murmurs.  1+ pitting edema pretibially bilaterally, symmetric.  Chronic venous stasis changes in lower legs noted, hyper pigmented and dry/scaly  skin Pulmonary:     Effort: Tachypnea and respiratory distress present.     Breath sounds: Decreased breath sounds present.     Comments: Diffusely diminished air sounds throughout, clear in the upper lobes.  Patient on NRB, he was weaned down to 3 L with SPO2 greater than 95%.  Tachypneic. Musculoskeletal:        General: Normal range of motion.     Cervical back: Normal range of motion.  Skin:    Comments: Very small approx dime sized superficial sacral wound without bleeding. Several small blister type lesions on left buttocks.   Neurological:     Mental Status: He is lethargic.     Comments: Sleepy.  Will wake up with shoulder rub.  He tries to tell me his name but speech is slurred.  Will respond with yes/no answers.  When he tries to speak he has slurred speech.  Diminished but symmetric handgrip bilaterally.  Does not follow commands to continue neurological exam.  PERRL bilaterally.  Psychiatric:        Behavior: Behavior normal.     ED Results / Procedures / Treatments   Labs (all labs ordered are listed, but only abnormal results are displayed) Labs Reviewed  CBC WITH DIFFERENTIAL/PLATELET - Abnormal; Notable for the following components:      Result Value   WBC 12.9 (*)    Neutro Abs 8.3 (*)    All other components within normal limits  COMPREHENSIVE METABOLIC PANEL - Abnormal; Notable for the following components:   Potassium 5.3 (*)    Chloride 88 (*)    CO2 37 (*)    Glucose, Bld 286 (*)    Creatinine, Ser 1.99 (*)    AST 48 (*)    ALT 51 (*)    GFR calc non Af Amer 32 (*)    GFR calc Af Amer 37 (*)    All other components within normal limits  MAGNESIUM - Abnormal; Notable for the following components:   Magnesium 1.5 (*)    All other components within normal limits  BLOOD GAS, VENOUS - Abnormal; Notable for the following components:   pH, Ven 7.176 (*)  pCO2, Ven 113 (*)    Bicarbonate 39.9 (*)    Acid-Base Excess 7.0 (*)    All other components within  normal limits  BRAIN NATRIURETIC PEPTIDE - Abnormal; Notable for the following components:   B Natriuretic Peptide 327.9 (*)    All other components within normal limits  BLOOD GAS, ARTERIAL - Abnormal; Notable for the following components:   pH, Arterial 7.164 (*)    pCO2 arterial 117 (*)    Bicarbonate 40.5 (*)    Acid-Base Excess 7.8 (*)    All other components within normal limits  CBG MONITORING, ED - Abnormal; Notable for the following components:   Glucose-Capillary 243 (*)    All other components within normal limits  I-STAT CHEM 8, ED - Abnormal; Notable for the following components:   Sodium 134 (*)    Potassium 5.3 (*)    Chloride 89 (*)    BUN 29 (*)    Creatinine, Ser 2.10 (*)    Glucose, Bld 273 (*)    TCO2 40 (*)    All other components within normal limits  RESPIRATORY PANEL BY RT PCR (FLU A&B, COVID)  CULTURE, BLOOD (ROUTINE X 2)  CULTURE, BLOOD (ROUTINE X 2)  LACTIC ACID, PLASMA  LACTIC ACID, PLASMA  ETHANOL  AMMONIA  POC SARS CORONAVIRUS 2 AG -  ED    EKG None  Radiology CT Head Wo Contrast  Result Date: 04/28/2020 CLINICAL DATA:  Altered mental status. EXAM: CT HEAD WITHOUT CONTRAST TECHNIQUE: Contiguous axial images were obtained from the base of the skull through the vertex without intravenous contrast. COMPARISON:  CT head 04/14/2020 FINDINGS: Brain: No evidence of acute infarction, hemorrhage, hydrocephalus, extra-axial collection or mass lesion/mass effect. Mild atrophy. Mild white matter changes consistent with chronic microvascular ischemia. Vascular: Negative for hyperdense vessel Skull: Negative Sinuses/Orbits: Paranasal sinuses clear.  Negative orbit. Other: None IMPRESSION: No acute abnormality. Electronically Signed   By: Franchot Gallo M.D.   On: 04/28/2020 13:18   DG Chest Portable 1 View  Result Date: 04/28/2020 CLINICAL DATA:  Hypoxia EXAM: PORTABLE CHEST 1 VIEW COMPARISON:  04/14/2020 FINDINGS: The heart size and mediastinal contours are  within normal limits. Atherosclerotic calcification of the aortic knob. Unchanged mild elevation of the right hemidiaphragm with minimal right basilar atelectasis. Lungs appear otherwise clear. No pleural effusion or pneumothorax. The visualized skeletal structures are unremarkable. IMPRESSION: No active disease. Electronically Signed   By: Davina Poke D.O.   On: 04/28/2020 13:09    Procedures Ultrasound ED Peripheral IV (Provider)  Date/Time: 04/28/2020 4:39 PM Performed by: Kinnie Feil, PA-C Authorized by: Kinnie Feil, PA-C   Procedure details:    Indications: multiple failed IV attempts     Skin Prep: chlorhexidine gluconate     Location:  Left AC   Angiocath:  20 G   Bedside Ultrasound Guided: Yes     Images: not archived     Patient tolerated procedure without complications: Yes     Dressing applied: Yes   Comments:     Blood cultures, lab work obtained.  .Critical Care Performed by: Kinnie Feil, PA-C Authorized by: Kinnie Feil, PA-C   Critical care provider statement:    Critical care time (minutes):  60   Critical care was necessary to treat or prevent imminent or life-threatening deterioration of the following conditions:  Respiratory failure   Critical care was time spent personally by me on the following activities:  Discussions with consultants, evaluation of patient's  response to treatment, examination of patient, ordering and performing treatments and interventions, ordering and review of laboratory studies, ordering and review of radiographic studies, pulse oximetry, re-evaluation of patient's condition, obtaining history from patient or surrogate, review of old charts and development of treatment plan with patient or surrogate   I assumed direction of critical care for this patient from another provider in my specialty: no     (including critical care time)  Medications Ordered in ED Medications  methylPREDNISolone sodium succinate  (SOLU-MEDROL) 125 mg/2 mL injection 125 mg (125 mg Intravenous Given 04/28/20 1506)  ipratropium-albuterol (DUONEB) 0.5-2.5 (3) MG/3ML nebulizer solution 3 mL (3 mLs Nebulization Given 04/28/20 1521)  ipratropium-albuterol (DUONEB) 0.5-2.5 (3) MG/3ML nebulizer solution (3 mLs  Given 04/28/20 1521)    ED Course  I have reviewed the triage vital signs and the nursing notes.  Pertinent labs & imaging results that were available during my care of the patient were reviewed by me and considered in my medical decision making (see chart for details).  Clinical Course as of Apr 29 1651  Fri Apr 28, 2020  1304 pH, Ven(!!): 7.176 [CG]  1304 pCO2, Ven(!!): 113 [CG]  1304 Bicarbonate(!): 39.9 [CG]  1304 Acid-Base Excess(!): 7.0 [CG]  1334 Last was 0.99  Creatinine(!): 1.99 [CG]  1334 AST(!): 48 [CG]  1335 ALT(!): 51 [CG]  1335 Potassium(!): 5.3 [CG]  1335 Chloride(!): 88 [CG]  1335 CO2(!): 37 [CG]  1335 Glucose(!): 286 [CG]  1335 Magnesium(!): 1.5 [CG]  1515 Spoke to Dr Benny Lennert declining admission. States she will not admit patient another ABG. Requesting ABG 1 hour after being on BiPAP.    [CG]    Clinical Course User Index [CG] Arlean Hopping   MDM Rules/Calculators/A&P                          Patient's EMR, triage and nursing notes reviewed to obtain more history and assist with MDM.   Two recent hospitalization for fall, generalized weakness, dehydration, AKI, DKA, hypercapnic respiratory failure. No available recent cardiac work up like echo, stress test. He does not carry diagnosis of COPD or tobacco use from what I can see. No PFTs. DNR at bedside.  Patient arrives extremely hypoxic SpO2 44% on RA, apparently 37% on RA by EMS.  No report that patient uses oxygen at facility.  Ill appearing. Very dry MMM. Altered with delayed responses, falls asleep quickly.  No rectal temperature. MAP > 65.  DDX includes metabolic encephalopathy vs occult infection like COVID respiratory  failure, PNA vs CHF. Appears slightly fluid overloaded in legs. ?DKA. However CBG in the 300s by EMS.   I attempted to obtain more history and called SNF - RN was at lunch. Second call kept ringing without pick up.   I called patient's nephew who was unable to provide more history to assist with today's visit.   I ordered initial work up including labs work, lactic acid, VBG, ammonia, ETOH, blood cultures, COVID, CXR, EKG, CT head.  Difficult IV access, bedside US guided IV placed by me.  Involved EDP Ralene Bathe in patient care given acuity of condition.   Ordered continuous cardiac monitor and pulse ox. Will closely monitor.    ER work up personally visualized and interpreted by me  Mild leukocytosis WBC 12.9. Lactic acid 1.8 > 1.4.  Hyperglycemia 286 without AG. Mild hyperkalemia 5.3. Hypomagnesemia 1.5. Creatinine 1.99, up from previous ~1.  Minimally elevated LFTs.  VBG reveals pH 7.176, CO 113, bicarb 39.9.   CXR without acute CP disease.  EKG without ischemic changes, hyperkalemia changes.   CT head non acute.   COVId, influenza A&B negative.   Bipap ordered. Suspect AMS 2/2 hypercapnia.    Albuterol neb, solumedrol ordered. No significant leukocytosis, lactic acidosis, fever to warrant IV abx at this time. This hypercapnic episode would be his second one in one month based on EMR.  Unclear source of retention/poor ventilation.     1600: Serial exams. Patient is more alert, trying to remove BiPAP machine but redirectable.  Evaluated again by RT who has weaned patient to 4 L [place].  He told RT he has history of COPD and uses 4 L [place] at SNF however unsure how reliable his history is. He was discharged recently on RA without oxygen requirements. HE does not carry diagnosis of COPD, tobacco use.   Spoke to Dr Benny Lennert and requested admission to SDU for hypercapnic respiratory failure. She declined admission and asked for a repeat ABG one hour after patient had been weaned from BiPAP.  Discussed with EDP Ralene Bathe.   Patient signed out to [place] who will monitor patient closely, await BNP and ABG and reconsult hospitalist for admission to ICU.  Final Clinical Impression(s) / ED Diagnoses Final diagnoses:  None    Rx / DC Orders ED Discharge Orders    None       Kinnie Feil, PA-C 04/28/20 1652    Quintella Reichert, MD 04/29/20 [phone]

## 2020-04-28 NOTE — ED Notes (Signed)
Placed condom catheter on patient.

## 2020-04-28 NOTE — Progress Notes (Addendum)
Patient placed on BiPAP appox 1339 for hypercarbia - CO2 113. He has minimal effort (56% pt effort measured) on BiPAP with shallow breathing noted. Settings 20/8 rr 18 40% and Vt obtained on those settings 300-420 mL RR 18. Patient will occasionally move around and obtain Vt of appox 550-600 mL, but will return back to shallow breathing and lower volumes within a short time. RN aware. RT will continue to monitor patient for improvement/decline in patient status.

## 2020-04-28 NOTE — ED Provider Notes (Addendum)
Care received from [facility].  Please see her note for full HPI.  In short, presented with somnolence in the setting of hypercarbic respiratory failure.  History is limited, unclear if this patient has a history of COPD or CHF, no diagnoses listed in his chart.  Respiratory therapist had noted that the patient endorsed being on 4 L nasal cannula at home, however did not endorse this to the provider.  Patient has been fairly somnolent and confused.  Previous provider spoke with the patient's nephew who is his only next of kin, who is not entirely aware of the patient's DNR/DNI status.  Upon chart review, on his last admission there was a DNR/DNI paperwork in the system.  His CBC with a slight leukocytosis of 12.9, CMP with a hyperkalemia of 5.3, glucose of 286, CO2 of 37 and chloride of 88.  He does also have an AKI with a creatinine of 1.99 and transaminitis.  Patient had some slurred speech on previous provider's exam, CT head was without any acute changes.  Chest x-ray without acute disease.  EKG with no significant changes from prior.  Initial pH of 7.17 and a PCO2 of 113.  Ammonia, ethanol, Covid is negative.  Blood cultures pending.  BNP of 327, magnesium 1.5.  Previous provider ministered multiple nebulizing treatments and Solu-Medrol.  Attempted to admit the patient however Dr. Benny Lennert did not admission, requesting repeat ABG and BNP.  Received care of the patient pending these lab values.  I spoke with Dr. Grandville Silos with the hospitalist group again as his repeat VBG showed a worsening pH of 7.164 and a PCO2 of 117.  Patient is confused and not cooperative with the BiPAP.  Dr. Grandville Silos requested critical care consult.  Dr. Ander Slade saw and evaluated the patient, given the patient's DNR/DNI status, recommends admission to the hospitalist group for supportive measures.   There was concern for the patient's DNR/DNI status as per record review there was documentation that the patient had a face to face  conversation and indicated that he would like to be a DNR/DNI and he had arrived with DNR paperwork. However the prior PA spoke with the patient's nephew who indicated that he thinks that his uncle would want chest compressions and intubation but would not want to be hooked up to long-term machines.  I spoke with Dr. Cyd Silence, the hospitalist, who requested that I confirm the patient's DNR/DNI status.  I personally spoke with the patient's nephew, who is agreeable to not going against the patient's prior DNR/DNI status.  He has not had any conversations with the patient between his last hospital visit and this visit that would indicate that the patient had changed his mind and wants to rescind the DNR/DNI status.  Patient will be admitted by Dr. Cyd Silence who will continue his management and treatment.  Pt was seen and evaluated by Dr. Karle Starch who is agreeable to the above plan and disposition   Physical Exam  BP 108/68   Pulse 97   Temp 98.6 F (37 C) (Oral)   Resp (!) 22   Ht 6' (1.829 m)   Wt 96.4 kg   SpO2 97%   BMI 28.82 kg/m   Physical Exam Vitals and nursing note reviewed.  Constitutional:      Appearance: He is well-developed.     Comments: Ill appearing, appears older than stated age   HENT:     Head: Normocephalic and atraumatic.     Comments: Lips cracked dry, laying with mouth open,  breathing through mouth  Eyes:     Conjunctiva/sclera: Conjunctivae normal.  Cardiovascular:     Rate and Rhythm: Normal rate and regular rhythm.     Heart sounds: No murmur heard.      Comments:  No murmurs, 1+ pitting edema in LE, dry scaly skin  Pulmonary:     Effort: Pulmonary effort is normal. Tachypnea present. No accessory muscle usage or respiratory distress.     Breath sounds: Normal breath sounds.     Comments: Decreased breath sounds, no obvious wheezes rales or rhonchi. Currently on 4L [place], sats at 97%  Chest:     Chest wall: No tenderness.  Abdominal:     Palpations: Abdomen  is soft.     Tenderness: There is no abdominal tenderness.  Musculoskeletal:        General: Normal range of motion.     Cervical back: Neck supple.     Right lower leg: No tenderness. Edema present.     Left lower leg: No tenderness. Edema present.  Skin:    General: Skin is warm and dry.     Capillary Refill: Capillary refill takes less than 2 seconds.     Comments: Very small superficial sacral wound without bleeding. Several small blister type lesions on left buttocks.    Neurological:     Mental Status: He is alert.     Motor: Weakness present.     Comments: Sleepy but arousable, will answer yes or no questions but cannot tell me his name, location or time. Redirectable, has some mildly slurred speech. Bilateral symmetrical weakness in upper and lower extremties   Psychiatric:        Mood and Affect: Mood normal.        Behavior: Behavior normal.     ED Course/Procedures   Results for orders placed or performed during the hospital encounter of 04/28/20  Respiratory Panel by RT PCR (Flu A&B, Covid) - Nasopharyngeal Swab   Specimen: Nasopharyngeal Swab  Result Value Ref Range   SARS Coronavirus 2 by RT PCR NEGATIVE NEGATIVE   Influenza A by PCR NEGATIVE NEGATIVE   Influenza B by PCR NEGATIVE NEGATIVE  CBC with Differential/Platelet  Result Value Ref Range   WBC 12.9 (H) 4.0 - 10.5 K/uL   RBC 4.71 4.22 - 5.81 MIL/uL   Hemoglobin 13.4 13.0 - 17.0 g/dL   HCT 44.1 39 - 52 %   MCV 93.6 80.0 - 100.0 fL   MCH 28.5 26.0 - 34.0 pg   MCHC 30.4 30.0 - 36.0 g/dL   RDW 15.1 11.5 - 15.5 %   Platelets 221 150 - 400 K/uL   nRBC 0.0 0.0 - 0.2 %   Neutrophils Relative % 64 %   Neutro Abs 8.3 (H) 1.7 - 7.7 K/uL   Lymphocytes Relative 30 %   Lymphs Abs 3.8 0.7 - 4.0 K/uL   Monocytes Relative 5 %   Monocytes Absolute 0.6 0 - 1 K/uL   Eosinophils Relative 0 %   Eosinophils Absolute 0.0 0 - 0 K/uL   Basophils Relative 0 %   Basophils Absolute 0.0 0 - 0 K/uL   Immature Granulocytes 1 %    Abs Immature Granulocytes 0.06 0.00 - 0.07 K/uL  Comprehensive metabolic panel  Result Value Ref Range   Sodium 137 135 - 145 mmol/L   Potassium 5.3 (H) 3.5 - 5.1 mmol/L   Chloride 88 (L) 98 - 111 mmol/L   CO2 37 (H) 22 - 32 mmol/L  Glucose, Bld 286 (H) 70 - 99 mg/dL   BUN 23 8 - 23 mg/dL   Creatinine, Ser 1.99 (H) 0.61 - 1.24 mg/dL   Calcium 9.4 8.9 - 10.3 mg/dL   Total Protein 7.2 6.5 - 8.1 g/dL   Albumin 3.9 3.5 - 5.0 g/dL   AST 48 (H) 15 - 41 U/L   ALT 51 (H) 0 - 44 U/L   Alkaline Phosphatase 100 38 - 126 U/L   Total Bilirubin 0.9 0.3 - 1.2 mg/dL   GFR calc non Af Amer 32 (L) >60 mL/min   GFR calc Af Amer 37 (L) >60 mL/min   Anion gap 12 5 - 15  Lactic acid, plasma  Result Value Ref Range   Lactic Acid, Venous 1.8 0.5 - 1.9 mmol/L  Lactic acid, plasma  Result Value Ref Range   Lactic Acid, Venous 1.4 0.5 - 1.9 mmol/L  Ethanol  Result Value Ref Range   Alcohol, Ethyl (B) <10 <10 mg/dL  Ammonia  Result Value Ref Range   Ammonia 12 9 - 35 umol/L  Magnesium  Result Value Ref Range   Magnesium 1.5 (L) 1.7 - 2.4 mg/dL  Blood gas, venous (at [facility] and [facility], not at [facility])  Result Value Ref Range   pH, Ven 7.176 (LL) 7.25 - 7.43   pCO2, Ven 113 (HH) 44 - 60 mmHg   pO2, Ven 42.4 32 - 45 mmHg   Bicarbonate 39.9 (H) 20.0 - 28.0 mmol/L   Acid-Base Excess 7.0 (H) 0.0 - 2.0 mmol/L   O2 Saturation 56.7 %   Patient temperature 98.6   Brain natriuretic peptide  Result Value Ref Range   B Natriuretic Peptide 327.9 (H) 0.0 - 100.0 pg/mL  Blood gas, arterial  Result Value Ref Range   pH, Arterial 7.164 (LL) 7.35 - 7.45   pCO2 arterial 117 (HH) 32 - 48 mmHg   pO2, Arterial 102 83 - 108 mmHg   Bicarbonate 40.5 (H) 20.0 - 28.0 mmol/L   Acid-Base Excess 7.8 (H) 0.0 - 2.0 mmol/L   O2 Saturation 95.3 %   Patient temperature 98.6    Sample type ARTERIAL   POC CBG, ED  Result Value Ref Range   Glucose-Capillary 243 (H) 70 - 99 mg/dL  I-stat chem 8, ED (not at [facility] or [facility])  Result  Value Ref Range   Sodium 134 (L) 135 - 145 mmol/L   Potassium 5.3 (H) 3.5 - 5.1 mmol/L   Chloride 89 (L) 98 - 111 mmol/L   BUN 29 (H) 8 - 23 mg/dL   Creatinine, Ser 2.10 (H) 0.61 - 1.24 mg/dL   Glucose, Bld 273 (H) 70 - 99 mg/dL   Calcium, Ion 1.17 1.15 - 1.40 mmol/L   TCO2 40 (H) 22 - 32 mmol/L   Hemoglobin 14.6 13.0 - 17.0 g/dL   HCT 43.0 39 - 52 %  POC SARS Coronavirus 2 Ag-ED - Nasal Swab (BD Veritor Kit)  Result Value Ref Range   SARS Coronavirus 2 Ag NEGATIVE NEGATIVE   CT Head Wo Contrast  Result Date: 04/28/2020 CLINICAL DATA:  Altered mental status. EXAM: CT HEAD WITHOUT CONTRAST TECHNIQUE: Contiguous axial images were obtained from the base of the skull through the vertex without intravenous contrast. COMPARISON:  CT head 04/14/2020 FINDINGS: Brain: No evidence of acute infarction, hemorrhage, hydrocephalus, extra-axial collection or mass lesion/mass effect. Mild atrophy. Mild white matter changes consistent with chronic microvascular ischemia. Vascular: Negative for hyperdense vessel Skull: Negative Sinuses/Orbits: Paranasal sinuses clear.  Negative orbit. Other: None IMPRESSION: No acute abnormality. Electronically Signed   By: Franchot Gallo M.D.   On: 04/28/2020 13:18   CT HEAD WO CONTRAST  Result Date: 04/14/2020 CLINICAL DATA:  Altered mental status. Generalized weakness for 1.5 weeks. Slurred speech. Slipped from chair. EXAM: CT HEAD WITHOUT CONTRAST TECHNIQUE: Contiguous axial images were obtained from the base of the skull through the vertex without intravenous contrast. COMPARISON:  None. FINDINGS: Brain: No evidence of parenchymal hemorrhage or extra-axial fluid collection. No mass lesion, mass effect, or midline shift. No CT evidence of acute infarction. Nonspecific mild subcortical and periventricular white matter hypodensity, most in keeping with chronic small vessel ischemic change. Cerebral volume is age appropriate. No ventriculomegaly. Vascular: No acute abnormality.  Skull: No evidence of calvarial fracture. Sinuses/Orbits: The visualized paranasal sinuses are essentially clear. Other:  The mastoid air cells are unopacified. IMPRESSION: 1. No evidence of acute intracranial abnormality. No evidence of calvarial fracture. 2. Mild chronic small vessel ischemic changes in the cerebral white matter. Electronically Signed   By: Ilona Sorrel M.D.   On: 04/14/2020 17:56   MR BRAIN WO CONTRAST  Result Date: 04/14/2020 CLINICAL DATA:  74 year old male with altered mental status, weakness. EXAM: MRI HEAD WITHOUT CONTRAST TECHNIQUE: Multiplanar, multiecho pulse sequences of the brain and surrounding structures were obtained without intravenous contrast. COMPARISON:  Head CT earlier today. FINDINGS: Brain: No restricted diffusion to suggest acute infarction. No midline shift, mass effect, evidence of mass lesion, ventriculomegaly, extra-axial collection or acute intracranial hemorrhage. Cervicomedullary junction and pituitary are within normal limits. Pearline Cables and white matter signal is within normal limits for age throughout the brain. No cortical encephalomalacia or chronic cerebral blood products identified. Vascular: Major intracranial vascular flow voids are preserved. 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 Disc degeneration elsewhere in the visible cervical spine. Visualized bone marrow signal is within normal limits. Sinuses/Orbits: Negative. Other: Mastoids are clear. Grossly normal visible internal auditory structures. Visible scalp and face appear negative. IMPRESSION: 1. No acute intracranial abnormality. Essentially normal for age noncontrast MRI appearance of the brain. 2. 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 Electronically Signed   By: Genevie Ann M.D.   On: 04/14/2020 22:12   DG Chest Portable 1 View  Result  Date: 04/28/2020 CLINICAL DATA:  Hypoxia EXAM: PORTABLE CHEST 1 VIEW COMPARISON:  04/14/2020 FINDINGS: The heart size and mediastinal contours are within normal limits. Atherosclerotic calcification of the aortic knob. Unchanged mild elevation of the right hemidiaphragm with minimal right basilar atelectasis. Lungs appear otherwise clear. No pleural effusion or pneumothorax. The visualized skeletal structures are unremarkable. IMPRESSION: No active disease. Electronically Signed   By: Davina Poke D.O.   On: 04/28/2020 13:09   DG Chest Port 1 View  Result Date: 04/14/2020 CLINICAL DATA:  Weakness. EXAM: PORTABLE CHEST 1 VIEW COMPARISON:  03/21/2020 FINDINGS: Cardiac silhouette is normal in size. No mediastinal or hilar masses. Linear opacities noted at the right lung base consistent with scarring or atelectasis, stable. Lungs otherwise clear. No pleural effusion or pneumothorax. Skeletal structures are grossly intact. IMPRESSION: No active disease. Electronically Signed   By: Lajean Manes M.D.   On: 04/14/2020 15:01    Clinical Course as of Apr 28 1799  Fri Apr 28, 2020  1304 pH, Ven(!!): 7.176 [CG]  1304 pCO2, Ven(!!): 113 [CG]  1304 Bicarbonate(!): 39.9 [  CG]  1304 Acid-Base Excess(!): 7.0 [CG]  1334 Last was 0.99  Creatinine(!): 1.99 [CG]  1334 AST(!): 48 [CG]  1335 ALT(!): 51 [CG]  1335 Potassium(!): 5.3 [CG]  1335 Chloride(!): 88 [CG]  1335 CO2(!): 37 [CG]  1335 Glucose(!): 286 [CG]  1335 Magnesium(!): 1.5 [CG]  1515 Spoke to Dr Benny Lennert declining admission. States she will not admit patient another ABG. Requesting ABG 1 hour after being on BiPAP.    [CG]    Clinical Course User Index [CG] Kinnie Feil, PA-C    Procedures  MDM         Garald Balding, PA-C 04/28/20 1901    Truddie Hidden, MD 04/28/20 [phone]

## 2020-04-28 NOTE — ED Notes (Signed)
Pt transported to CT ?

## 2020-04-28 NOTE — Progress Notes (Signed)
Pt has been unable to tolerate BIPAP, pt currently on 3 LPM [place] and tolerating well.  Pt somewhat somulant but arousable at this time.  RT to monitor and assess as needed.

## 2020-04-28 NOTE — ED Provider Notes (Signed)
Care of the patient assumed at the change of shift. Patient with hypercapnic respiratory failure.  Physical Exam  BP 93/81   Pulse 96   Temp 98.6 F (37 C) (Oral)   Resp 17   Ht 6' (1.829 m)   Wt 96.4 kg   SpO2 100%   BMI 28.82 kg/m   Physical Exam Patient somnolent, tries to answer questions inconsistently.   ED Course/Procedures   Clinical Course as of Apr 28 1712  Fri Apr 28, 2020  1304 pH, Ven(!!): 7.176 [CG]  1304 pCO2, Ven(!!): 113 [CG]  1304 Bicarbonate(!): 39.9 [CG]  1304 Acid-Base Excess(!): 7.0 [CG]  1334 Last was 0.99  Creatinine(!): 1.99 [CG]  1334 AST(!): 48 [CG]  1335 ALT(!): 51 [CG]  1335 Potassium(!): 5.3 [CG]  1335 Chloride(!): 88 [CG]  1335 CO2(!): 37 [CG]  1335 Glucose(!): 286 [CG]  1335 Magnesium(!): 1.5 [CG]  1515 Spoke to Dr Benny Lennert declining admission. States she will not admit patient another ABG. Requesting ABG 1 hour after being on BiPAP.    [CG]    Clinical Course User Index [CG] Kinnie Feil, PA-C    Procedures  MDM  Patient will not tolerate BiPAP mask. He has a vaild DNR at bedside and notes from recent admission indicates he has also wanted to be DNI. He is not alert enough to give any updated wishes so will proceed under the most recent notes.      Truddie Hidden, MD 04/28/20 [phone]

## 2020-04-29 ENCOUNTER — Inpatient Hospital Stay (HOSPITAL_COMMUNITY): Payer: Medicare HMO

## 2020-04-29 DIAGNOSIS — I82411 Acute embolism and thrombosis of right femoral vein: Secondary | ICD-10-CM

## 2020-04-29 DIAGNOSIS — I82419 Acute embolism and thrombosis of unspecified femoral vein: Secondary | ICD-10-CM

## 2020-04-29 DIAGNOSIS — R531 Weakness: Secondary | ICD-10-CM

## 2020-04-29 DIAGNOSIS — Z66 Do not resuscitate: Secondary | ICD-10-CM

## 2020-04-29 DIAGNOSIS — R7989 Other specified abnormal findings of blood chemistry: Secondary | ICD-10-CM

## 2020-04-29 DIAGNOSIS — Z7189 Other specified counseling: Secondary | ICD-10-CM

## 2020-04-29 DIAGNOSIS — J189 Pneumonia, unspecified organism: Secondary | ICD-10-CM

## 2020-04-29 DIAGNOSIS — R609 Edema, unspecified: Secondary | ICD-10-CM | POA: Diagnosis not present

## 2020-04-29 DIAGNOSIS — I5021 Acute systolic (congestive) heart failure: Secondary | ICD-10-CM

## 2020-04-29 DIAGNOSIS — J9601 Acute respiratory failure with hypoxia: Secondary | ICD-10-CM | POA: Diagnosis not present

## 2020-04-29 DIAGNOSIS — Z515 Encounter for palliative care: Secondary | ICD-10-CM

## 2020-04-29 LAB — COMPREHENSIVE METABOLIC PANEL
ALT: 45 U/L — ABNORMAL HIGH (ref 0–44)
AST: 47 U/L — ABNORMAL HIGH (ref 15–41)
Albumin: 3.1 g/dL — ABNORMAL LOW (ref 3.5–5.0)
Alkaline Phosphatase: 83 U/L (ref 38–126)
Anion gap: 9 (ref 5–15)
BUN: 27 mg/dL — ABNORMAL HIGH (ref 8–23)
CO2: 37 mmol/L — ABNORMAL HIGH (ref 22–32)
Calcium: 8.6 mg/dL — ABNORMAL LOW (ref 8.9–10.3)
Chloride: 93 mmol/L — ABNORMAL LOW (ref 98–111)
Creatinine, Ser: 1.73 mg/dL — ABNORMAL HIGH (ref 0.61–1.24)
GFR calc Af Amer: 44 mL/min — ABNORMAL LOW (ref >60)
GFR calc non Af Amer: 38 mL/min — ABNORMAL LOW (ref >60)
Glucose, Bld: 100 mg/dL — ABNORMAL HIGH (ref 70–99)
Potassium: 5 mmol/L (ref 3.5–5.1)
Sodium: 139 mmol/L (ref 135–145)
Total Bilirubin: 0.7 mg/dL (ref 0.3–1.2)
Total Protein: 6.1 g/dL — ABNORMAL LOW (ref 6.5–8.1)

## 2020-04-29 LAB — ECHOCARDIOGRAM COMPLETE
AR max vel: 2.92 cm2
AV Area VTI: 3.29 cm2
AV Area mean vel: 3.43 cm2
AV Mean grad: 1.4 mmHg
AV Peak grad: 3.2 mmHg
Ao pk vel: 0.89 m/s
Area-P 1/2: 7.37 cm2
Height: 72 in
S' Lateral: 3.3 cm
Single Plane A4C EF: 54.8 %
Weight: 3400.38 oz

## 2020-04-29 LAB — BLOOD GAS, ARTERIAL
Acid-Base Excess: 6 mmol/L — ABNORMAL HIGH (ref 0.0–2.0)
Acid-Base Excess: 8.7 mmol/L — ABNORMAL HIGH (ref 0.0–2.0)
Bicarbonate: 37.1 mmol/L — ABNORMAL HIGH (ref 20.0–28.0)
Bicarbonate: 37.6 mmol/L — ABNORMAL HIGH (ref 20.0–28.0)
Delivery systems: POSITIVE
FIO2: 40
FIO2: 40
Mode: POSITIVE
O2 Saturation: 94.3 %
O2 Saturation: 74.6 %
Patient temperature: 98.6
Patient temperature: 98.6
pCO2 arterial: 99.9 mmHg (ref 32.0–48.0)
pCO2 arterial: 75.6 mmHg (ref 32.0–48.0)
pH, Arterial: 7.195 — CL (ref 7.350–7.450)
pH, Arterial: 7.318 — ABNORMAL LOW (ref 7.350–7.450)
pO2, Arterial: 81 mmHg — ABNORMAL LOW (ref 83.0–108.0)
pO2, Arterial: 44.8 mmHg — ABNORMAL LOW (ref 83.0–108.0)

## 2020-04-29 LAB — CBC WITH DIFFERENTIAL/PLATELET
Abs Immature Granulocytes: 0.07 10*3/uL (ref 0.00–0.07)
Basophils Absolute: 0 10*3/uL (ref 0.0–0.1)
Basophils Relative: 0 %
Eosinophils Absolute: 0 10*3/uL (ref 0.0–0.5)
Eosinophils Relative: 0 %
HCT: 38.9 % — ABNORMAL LOW (ref 39.0–52.0)
Hemoglobin: 11.5 g/dL — ABNORMAL LOW (ref 13.0–17.0)
Immature Granulocytes: 1 %
Lymphocytes Relative: 18 %
Lymphs Abs: 2.4 10*3/uL (ref 0.7–4.0)
MCH: 28.8 pg (ref 26.0–34.0)
MCHC: 29.6 g/dL — ABNORMAL LOW (ref 30.0–36.0)
MCV: 97.5 fL (ref 80.0–100.0)
Monocytes Absolute: 0.3 10*3/uL (ref 0.1–1.0)
Monocytes Relative: 2 %
Neutro Abs: 10.4 10*3/uL — ABNORMAL HIGH (ref 1.7–7.7)
Neutrophils Relative %: 79 %
Platelets: 152 10*3/uL (ref 150–400)
RBC: 3.99 MIL/uL — ABNORMAL LOW (ref 4.22–5.81)
RDW: 15 % (ref 11.5–15.5)
WBC: 13.2 10*3/uL — ABNORMAL HIGH (ref 4.0–10.5)
nRBC: 0 % (ref 0.0–0.2)

## 2020-04-29 LAB — CBG MONITORING, ED
Glucose-Capillary: 200 mg/dL — ABNORMAL HIGH (ref 70–99)
Glucose-Capillary: 162 mg/dL — ABNORMAL HIGH (ref 70–99)
Glucose-Capillary: 118 mg/dL — ABNORMAL HIGH (ref 70–99)
Glucose-Capillary: 93 mg/dL (ref 70–99)
Glucose-Capillary: 101 mg/dL — ABNORMAL HIGH (ref 70–99)
Glucose-Capillary: 75 mg/dL (ref 70–99)

## 2020-04-29 LAB — HEPARIN LEVEL (UNFRACTIONATED): Heparin Unfractionated: <0.1 IU/mL — ABNORMAL LOW (ref 0.30–0.70)

## 2020-04-29 LAB — MAGNESIUM: Magnesium: 1.7 mg/dL (ref 1.7–2.4)

## 2020-04-29 MED ORDER — HEPARIN BOLUS VIA INFUSION
2000.0000 [IU] | Freq: Once | INTRAVENOUS | Status: AC
  Administered 2020-04-30: 2000 [IU] via INTRAVENOUS
  Filled 2020-04-29: qty 2000

## 2020-04-29 MED ORDER — HEPARIN (PORCINE) 25000 UT/250ML-% IV SOLN
1850.0000 [IU]/h | INTRAVENOUS | Status: DC
  Administered 2020-04-30: 1850 [IU]/h via INTRAVENOUS
  Filled 2020-04-29: qty 250

## 2020-04-29 MED ORDER — LACTATED RINGERS IV SOLN: INTRAVENOUS | Status: DC

## 2020-04-29 MED ORDER — IPRATROPIUM-ALBUTEROL 0.5-2.5 (3) MG/3ML IN SOLN
3.0000 mL | Freq: Four times a day (QID) | RESPIRATORY_TRACT | Status: DC
  Administered 2020-04-29 (×2): 3 mL via RESPIRATORY_TRACT
  Filled 2020-04-29 (×2): qty 3

## 2020-04-29 MED ORDER — INSULIN ASPART 100 UNIT/ML ~~LOC~~ SOLN
0.0000 [IU] | Freq: Three times a day (TID) | SUBCUTANEOUS | Status: DC
  Administered 2020-04-30 (×2): 1 [IU] via SUBCUTANEOUS
  Administered 2020-05-01 (×2): 3 [IU] via SUBCUTANEOUS
  Administered 2020-05-01 – 2020-05-02 (×2): 2 [IU] via SUBCUTANEOUS
  Administered 2020-05-02 – 2020-05-03 (×3): 3 [IU] via SUBCUTANEOUS
  Administered 2020-05-04 – 2020-05-05 (×4): 2 [IU] via SUBCUTANEOUS
  Administered 2020-05-05: 3 [IU] via SUBCUTANEOUS
  Filled 2020-04-29: qty 0.09

## 2020-04-29 MED ORDER — HEPARIN BOLUS VIA INFUSION
2500.0000 [IU] | Freq: Once | INTRAVENOUS | Status: AC
  Administered 2020-04-29: 2500 [IU] via INTRAVENOUS
  Filled 2020-04-29: qty 2500

## 2020-04-29 MED ORDER — INSULIN ASPART 100 UNIT/ML ~~LOC~~ SOLN
0.0000 [IU] | Freq: Every day | SUBCUTANEOUS | Status: DC
  Filled 2020-04-29: qty 0.05

## 2020-04-29 MED ORDER — PERFLUTREN LIPID MICROSPHERE
1.0000 mL | INTRAVENOUS | Status: AC | PRN
  Administered 2020-04-29: 4 mL via INTRAVENOUS
  Filled 2020-04-29: qty 10

## 2020-04-29 MED ORDER — ALBUTEROL SULFATE (2.5 MG/3ML) 0.083% IN NEBU
2.5000 mg | INHALATION_SOLUTION | Freq: Three times a day (TID) | RESPIRATORY_TRACT | Status: DC
  Administered 2020-04-29: 2.5 mg via RESPIRATORY_TRACT
  Filled 2020-04-29: qty 3

## 2020-04-29 MED ORDER — HEPARIN (PORCINE) 25000 UT/250ML-% IV SOLN
1650.0000 [IU]/h | INTRAVENOUS | Status: DC
  Administered 2020-04-29: 1650 [IU]/h via INTRAVENOUS
  Filled 2020-04-29: qty 250

## 2020-04-29 MED ORDER — HYDROMORPHONE HCL 1 MG/ML IJ SOLN: 0.5000 mg | INTRAMUSCULAR | Status: DC | PRN

## 2020-04-29 NOTE — Progress Notes (Signed)
  Echocardiogram 2D Echocardiogram has been performed.  Michiel Cowboy 04/29/2020, 11:39 AM

## 2020-04-29 NOTE — ED Notes (Signed)
I/2 cup Ice chips

## 2020-04-29 NOTE — Progress Notes (Signed)
Notified Lab that ABG being sent for analysis. 

## 2020-04-29 NOTE — ED Notes (Signed)
Respiratory aware of ABG

## 2020-04-29 NOTE — Progress Notes (Signed)
RT received call from RN stating that PT seems to be getting a little confused. RT arrived to ED and placed PT back on BiPAP at approximately 1415- RN aware.  PT was not in respiratory distress at time of BiPAP application but did appear to be slightly lathargic. RT will return after transporting another PT to ICU to complete ordered ABG- RN aware.

## 2020-04-29 NOTE — Progress Notes (Signed)
Bilateral upper extremity venous duplex, bilateral lower extremity venous duplex, and IVC/iliac vein duplex completed. Refer to "CV Proc" under chart review to view preliminary results.  Preliminary results discussed with Dr. Cyndia Skeeters.  04/29/2020 12:24 PM Kelby Aline., MHA, RVT, RDCS, RDMS

## 2020-04-29 NOTE — Progress Notes (Signed)
PROGRESS NOTE  Baraa Tubbs TTS:[id number] DOB: [birth year]   PCP: [name], MD  Patient is from: SNF  DOA: 04/28/2020 LOS: 1  Brief Narrative / Interim history: 74 year old male with history of dementia, prostate cancer, DM-2, COPD?  CHF, HTN, GERD and recent hospitalization from 9/17-9/22 with generalized weakness, mild DKA, respiratory failure and encephalopathy brought to ED from SNF with altered mental status, hypoxemia and generalized weakness, and admitted for acute hypoxic hypercapnic respiratory failure, pneumonia, AKI and acute metabolic encephalopathy.  Per EMS, hypoxic to 37% on RA.  Patient has no close family member other than his nephew as next of kin who does not process HC POA.   In ED, hypoxic to 44% on RA and recovered to upper 90s on 50 L by NRB.  Brief tachycardia and tachypnea.  Afebrile.  VBG 7.17/113/42/40.  ABG 7.1 6/117/102/40.5.  WBC 12.9 without left shift.  BNP 328.  Lactic acid negative.  K5.3. Cr 1.99.  BUN 23.  Bicarb 37.  Anion gap 12.  Ammonia within normal.  Borderline LFT. PCCM consulted recommended admission to medical service as patient is DNR and DNI.  Started on BiPAP.  Repeat ABG 7.23/96/77/39.  However, he was put on 3 L by nasal cannula overnight as he kept on removing the BiPAP.   The next morning, some improvement in his encephalopathy.  ABG 7.1 9/100/81/37.  Went back on BiPAP.  Echo with normal EF and no significant structural or functional abnormalities.  LE US revealed acute DVT with mobile thrombus in right common femoral vein, and right posterior tibial vein. UE [place] negative for DVT.  Patient was started on heparin drip.    Subjective: Seen and examined earlier this morning.  Somewhat sleepy but awakes to voice.  He is oriented to self and follows commands.  Reports feeling cold.  Denies pain.  Does not appear to be in distress.  He is currently on BiPAP.  Objective: Vitals:   04/29/20 1215 04/29/20 1245 04/29/20 1252 04/29/20 1315  BP:  101/79 98/77  (!) 95/59  Pulse: 89 92  94  Resp: (!) 22 (!) 21  18  Temp:    98.6 F (37 C)  TempSrc:    Oral  SpO2: 96% 96% 96% 100%  Weight:      Height:        Intake/Output Summary (Last 24 hours) at 04/29/2020 1340 Last data filed at 04/28/2020 2150 Gross per 24 hour  Intake 50 ml  Output --  Net 50 ml   Filed Weights   04/28/20 1143  Weight: 96.4 kg    Examination:  GENERAL: Frail looking elderly male.  No apparent distress. HEENT: MMM.  Vision and hearing grossly intact.  NECK: Supple.  Mild JVD RESP: On BiPAP.  No IWOB.  Coarse air movement bilaterally. CVS:  RRR. Heart sounds normal.  ABD/GI/GU: BS+. Abd soft, NTND.  MSK/EXT:  Moves extremities. No apparent deformity. No edema or calf tenderness.  SKIN: no apparent skin lesion or wound NEURO: Sleepy but wakes to voice.  Oriented to self.  Follows commands.  On BiPAP.  No apparent focal neuro deficit. PSYCH: Calm.  No distress or agitation.  Procedures:  None  Microbiology summarized: COVID-19 PCR negative. Blood cultures NGTD. Urine culture pending.  Assessment & Plan: Acute respiratory failure with hypoxia and hypercapnia-suggestive of obstructive etiology although no PFT for formal diagnosis of COPD.  Could have sleep apnea as well.   CT chest/abdomen/pelvis without contrast also raises concern for LLL pneumonia.  However, this could also be lung infarction from possible PE from RLE DVT.  ABG with significant acute on chronic respiratory acidosis. -Continue BiPAP -Repeat ABG -IV heparin as below -Continue IV ceftriaxone and azithromycin for possible pneumonia. -Scheduled DuoNeb every 6 hours while awake  Acute right lower extremity DVT: LE US revealed acute DVT with mobile thrombus in right common femoral vein, and right posterior tibial vein.  Cannot exclude PE at this time. -Start IV heparin drip -We will obtain CTA chest once renal function improves.  Possible pneumonia: CT chest/abdomen/pelvis  without contrast raises concern for left lower lobe pneumonia.  This could be lung infarction from possible PE as well. -Continue coverage with IV ceftriaxone and azithromycin -N.p.o. -SLP eval to assess for dysphagia.  Acute metabolic encephalopathy: Likely due to hypercapnia.  Ammonia within normal.  No focal neuro deficit but limited exam due to mental status. CT head without acute finding. -Treat treatable causes -Avoid sedating medications -Reorientation and delirium precautions  Mildly elevated troponin: High-sensitivity troponin 61.  EKG with sinus tachycardia, RBBB and LAFB, unchanged from baseline.  Elevated troponin likely demand ischemia from respiratory failure.   Acute kidney injury/mild azotemia: Baseline Cr 0.9-1.0> 1.99 (admit)> 2.12.  Likely prerenal.  He is also on Lasix -Continue gentle IV fluid -Avoid nephrotoxic meds-hold Lasix.  DM-2 with hyperglycemia No results for input(s): HGBA1C in the last 72 hours. Recent Labs  Lab 04/28/20 2019 04/29/20 0139 04/29/20 0417 04/29/20 0819 04/29/20 1153  GLUCAP 248* 200* 162* 118* 93  -Discontinue Lantus and change SSI to renal while n.p.o. -Monitor CBG  Essential hypertension: Normotensive. -Continue holding home Coreg and Lasix  Mild elevated LFT: Likely ischemic -Continue monitoring  Hyperkalemia: Resolved.  Leukocytosis with bandemia: Due to pneumonia? -Continue monitoring.  DNR/DNI-per nephew. Patient has no close family member other that his nephew and friend.      Body mass index is 28.82 kg/m.         DVT prophylaxis:  heparin bolus via infusion 2,500 Units Start: 04/29/20 1245  Code Status: DNR/DNI-per Nephew  Family Communication: Updated patient's nephew, [name] and friend, Arbie Cookey over the phone. Status is: Inpatient  Remains inpatient appropriate because:Hemodynamically unstable, Altered mental status, IV treatments appropriate due to intensity of illness or inability to take PO and  Inpatient level of care appropriate due to severity of illness   Dispo: The patient is from: SNF              Anticipated d/c is to: SNF              Anticipated d/c date is: > 3 days              Patient currently is not medically stable to d/c.       Consultants:  PCCM   Sch Meds:  Scheduled Meds: . heparin  2,500 Units Intravenous Once  . insulin aspart  0-20 Units Subcutaneous Q4H  . insulin glargine  10 Units Subcutaneous Daily  . ipratropium-albuterol  3 mL Nebulization Q6H WA  . pantoprazole (PROTONIX) IV  40 mg Intravenous Q12H   Continuous Infusions: . azithromycin Stopped (04/29/20 1117)  . cefTRIAXone (ROCEPHIN)  IV Stopped (04/29/20 0136)  . heparin     PRN Meds:.acetaminophen **OR** acetaminophen, albuterol, hydrALAZINE, ondansetron **OR** ondansetron (ZOFRAN) IV  Antimicrobials: Anti-infectives (From admission, onward)   Start     Dose/Rate Route Frequency Ordered Stop   04/28/20 2359  azithromycin (ZITHROMAX) 500 mg in sodium chloride 0.9 % 250 mL IVPB  500 mg 250 mL/hr over 60 Minutes Intravenous Every 24 hours 04/28/20 2344 05/03/20 2359   04/28/20 2230  cefTRIAXone (ROCEPHIN) 1 g in sodium chloride 0.9 % 100 mL IVPB        1 g 200 mL/hr over 30 Minutes Intravenous Every 24 hours 04/28/20 2200         I have personally reviewed the following labs and images: CBC: Recent Labs  Lab 04/28/20 1215 04/28/20 1333 04/29/20 0505  WBC 12.9*  --  13.2*  NEUTROABS 8.3*  --  10.4*  HGB 13.4 14.6 11.5*  HCT 44.1 43.0 38.9*  MCV 93.6  --  97.5  PLT 221  --  152   BMP &GFR Recent Labs  Lab 04/28/20 1215 04/28/20 1236 04/28/20 1333 04/28/20 2134  NA 137  --  134* 138  K 5.3*  --  5.3* 5.1  CL 88*  --  89* 90*  CO2 37*  --   --  38*  GLUCOSE 286*  --  273* 248*  BUN 23  --  29* 26*  CREATININE 1.99*  --  2.10* 2.12*  CALCIUM 9.4  --   --  9.0  MG  --  1.5*  --   --   PHOS  --   --   --  7.1*   Estimated Creatinine Clearance: 37.4  mL/min (A) (by C-G formula based on SCr of 2.12 mg/dL (H)). Liver & Pancreas: Recent Labs  Lab 04/28/20 1215 04/28/20 2134  AST 48*  --   ALT 51*  --   ALKPHOS 100  --   BILITOT 0.9  --   PROT 7.2  --   ALBUMIN 3.9 3.4*   No results for input(s): LIPASE, AMYLASE in the last 168 hours. Recent Labs  Lab 04/28/20 1236  AMMONIA 12   Diabetic: No results for input(s): HGBA1C in the last 72 hours. Recent Labs  Lab 04/28/20 2019 04/29/20 0139 04/29/20 0417 04/29/20 0819 04/29/20 1153  GLUCAP 248* 200* 162* 118* 93   Cardiac Enzymes: No results for input(s): CKTOTAL, CKMB, CKMBINDEX, TROPONINI in the last 168 hours. No results for input(s): PROBNP in the last 8760 hours. Coagulation Profile: Recent Labs  Lab 04/28/20 2134  INR 0.9   Thyroid Function Tests: No results for input(s): TSH, T4TOTAL, FREET4, T3FREE, THYROIDAB in the last 72 hours. Lipid Profile: No results for input(s): CHOL, HDL, LDLCALC, TRIG, CHOLHDL, LDLDIRECT in the last 72 hours. Anemia Panel: Recent Labs    04/28/20 1215  VITAMINB12 626  FOLATE 13.5   Urine analysis:    Component Value Date/Time   COLORURINE AMBER (A) 04/28/2020 2016   APPEARANCEUR CLOUDY (A) 04/28/2020 2016   LABSPEC 1.020 04/28/2020 2016   PHURINE 5.0 04/28/2020 2016   GLUCOSEU NEGATIVE 04/28/2020 2016   HGBUR NEGATIVE 04/28/2020 2016   BILIRUBINUR NEGATIVE 04/28/2020 2016   [place] NEGATIVE 04/28/2020 2016   PROTEINUR 100 (A) 04/28/2020 2016   UROBILINOGEN 0.2 11/05/2014 1712   NITRITE NEGATIVE 04/28/2020 2016   LEUKOCYTESUR LARGE (A) 04/28/2020 2016   Sepsis Labs: Invalid input(s): PROCALCITONIN, [place]  Microbiology: Recent Results (from the past 240 hour(s))  Blood culture (routine x 2)     Status: None (Preliminary result)   Collection Time: 04/28/20 12:30 PM   Specimen: BLOOD  Result Value Ref Range Status   Specimen Description   Final    BLOOD RIGHT ARM UPPER Performed at [facility], [place] [address] [place], [place] [address]  Special Requests   Final    BOTTLES DRAWN AEROBIC ONLY Blood Culture results may not be optimal due to an inadequate volume of blood received in culture bottles Performed at [place] [facility] [address], [place], [place] [address]    Culture   Final    NO GROWTH < 24 HOURS Performed at [facility] [address] [place], [place] [address]    Report Status PENDING  Incomplete  Blood culture (routine x 2)     Status: None (Preliminary result)   Collection Time: 04/28/20 12:41 PM   Specimen: BLOOD  Result Value Ref Range Status   Specimen Description   Final    BLOOD LEFT ARM UPPER Performed at [facility] [address] [place], [place] [address]    Special Requests   Final    BOTTLES DRAWN AEROBIC AND ANAEROBIC Blood Culture adequate volume Performed at [facility] [address], [place], [place] [address]    Culture   Final    NO GROWTH < 24 HOURS Performed at [facility] [address], [place], [place] [address]    Report Status PENDING  Incomplete  Respiratory Panel by RT PCR (Flu A&B, Covid) - Nasopharyngeal Swab     Status: None   Collection Time: 04/28/20  1:21 PM   Specimen: Nasopharyngeal Swab  Result Value Ref Range Status   SARS Coronavirus 2 by RT PCR NEGATIVE NEGATIVE Final    Comment: (NOTE) SARS-CoV-2 target nucleic acids are NOT DETECTED.  The SARS-CoV-2 RNA is generally detectable in upper respiratoy specimens during the acute phase of infection. The lowest concentration of SARS-CoV-2 viral copies this assay can detect is 131 copies/mL. A negative result does not preclude SARS-Cov-2 infection and should not be used as the sole basis for treatment or other patient management decisions. A negative result may occur with  improper specimen collection/handling, submission of specimen other than nasopharyngeal swab, presence of  viral mutation(s) within the areas targeted by this assay, and inadequate number of viral copies (<131 copies/mL). A negative result must be combined with clinical observations, patient history, and epidemiological information. The expected result is Negative.  Fact Sheet for Patients:  PinkCheek.be  Fact Sheet for Healthcare Providers:  GravelBags.it  This test is no t yet approved or cleared by the [place] FDA and  has been authorized for detection and/or diagnosis of SARS-CoV-2 by FDA under an Emergency Use Authorization (EUA). This EUA will remain  in effect (meaning this test can be used) for the duration of the COVID-19 declaration under Section 564(b)(1) of the Act, 21 U.S.C. section 360bbb-3(b)(1), unless the authorization is terminated or revoked sooner.     Influenza A by PCR NEGATIVE NEGATIVE Final   Influenza B by PCR NEGATIVE NEGATIVE Final    Comment: (NOTE) The Xpert Xpress SARS-CoV-2/FLU/RSV assay is intended as an aid in  the diagnosis of influenza from Nasopharyngeal swab specimens and  should not be used as a sole basis for treatment. Nasal washings and  aspirates are unacceptable for Xpert Xpress SARS-CoV-2/FLU/RSV  testing.  Fact Sheet for Patients: PinkCheek.be  Fact Sheet for Healthcare Providers: GravelBags.it  This test is not yet approved or cleared by the [place] FDA and  has been authorized for detection and/or diagnosis of SARS-CoV-2 by  FDA under an Emergency Use Authorization (EUA). This EUA will remain  in effect (meaning this test can be used) for the duration of the  Covid-19 declaration under Section 564(b)(1) of the Act, 21  U.S.C. section 360bbb-3(b)(1), unless the authorization is  terminated or revoked. Performed at [facility], [place] [address], [place], [place] [address]     Radiology  Studies: CT ABDOMEN PELVIS WO CONTRAST  Result Date: 04/28/2020 CLINICAL DATA:  74 year old male with weakness, lethargy. "Malignancy workup" EXAM: CT CHEST, ABDOMEN AND PELVIS WITHOUT CONTRAST TECHNIQUE: Multidetector CT imaging of the chest, abdomen and pelvis was performed following the standard protocol without IV contrast. COMPARISON:  Head CT earlier today. Portable chest earlier today. CT Abdomen and Pelvis 12/24/2019 and earlier. FINDINGS: CT CHEST FINDINGS Cardiovascular: No cardiomegaly or pericardial effusion. Vascular patency is not evaluated in the absence of IV contrast. Mediastinum/Nodes: No lymphadenopathy is evident in the absence of contrast. Lungs/Pleura: Chronic elevation of the right hemidiaphragm appears stable since 2015. There is multifocal nodular and confluent peribronchial opacity in the left lower lobe with trace air bronchograms, new since 12/24/2019. No pleural effusion. Major airways are patent.  Upper lobes and middle lobes are clear. Musculoskeletal: Spinal ankylosis. Degenerative changes at the visible shoulders. No acute or suspicious osseous lesion. CT ABDOMEN PELVIS FINDINGS Hepatobiliary: Surgically absent gallbladder as before. Negative noncontrast liver. Pancreas: Negative noncontrast pancreas. Spleen: Negative. Adrenals/Urinary Tract: Adrenal glands appear stable and within normal limits. Chronic exophytic right lower pole renal cyst with simple fluid density. Chronic smaller left midpole renal cyst with simple fluid density. No hydronephrosis. 4 mm left renal lower pole calculus. No hydroureter. Unremarkable urinary bladder. Numerous pelvic phleboliths. Stomach/Bowel: Fluid in nondistended rectum. Redundant sigmoid colon mostly containing gas. Diverticulosis of the descending colon which is decompressed. Mild stool in the transverse colon which is partially decompressed. Mildly redundant right colon containing gas and stool. Normal appendix on series 2, image 81. No large  bowel inflammation. No dilated small bowel. Moderately air in fluid distended stomach, but the gastric antrum and proximal duodenum appear unremarkable. Decompressed duodenum. No free air. No free fluid. Vascular/Lymphatic: Normal caliber aorta. Mild calcified plaque. Vascular patency is not evaluated in the absence of IV contrast. No lymphadenopathy is evident. Reproductive: Negative. Other: Mild dependent body wall edema. Trace presacral stranding or fluid. Musculoskeletal: Chronic lumbar spine ankylosis. But the SI joints remain relatively patent. Advanced degenerative changes at both hips. No acute or suspicious osseous lesion identified. IMPRESSION: 1. Left lower lobe opacity suspicious for Bronchopneumonia. Consider also aspiration. No pleural effusion. 2. Otherwise no acute or malignant process identified in the noncontrast chest, abdomen, or pelvis. 3. Chronic spinal ankylosis. Left nephrolithiasis. Descending colon diverticulosis. Electronically Signed   By: Genevie Ann M.D.   On: 04/28/2020 21:04   CT CHEST WO CONTRAST  Result Date: 04/28/2020 CLINICAL DATA:  74 year old male with weakness, lethargy. "Malignancy workup" EXAM: CT CHEST, ABDOMEN AND PELVIS WITHOUT CONTRAST TECHNIQUE: Multidetector CT imaging of the chest, abdomen and pelvis was performed following the standard protocol without IV contrast. COMPARISON:  Head CT earlier today. Portable chest earlier today. CT Abdomen and Pelvis 12/24/2019 and earlier. FINDINGS: CT CHEST FINDINGS Cardiovascular: No cardiomegaly or pericardial effusion. Vascular patency is not evaluated in the absence of IV contrast. Mediastinum/Nodes: No lymphadenopathy is evident in the absence of contrast. Lungs/Pleura: Chronic elevation of the right hemidiaphragm appears stable since 2015. There is multifocal nodular and confluent peribronchial opacity in the left lower lobe with trace air bronchograms, new since 12/24/2019. No pleural effusion. Major airways are patent.   Upper lobes and middle lobes are clear. Musculoskeletal: Spinal ankylosis. Degenerative changes at  the visible shoulders. No acute or suspicious osseous lesion. CT ABDOMEN PELVIS FINDINGS Hepatobiliary: Surgically absent gallbladder as before. Negative noncontrast liver. Pancreas: Negative noncontrast pancreas. Spleen: Negative. Adrenals/Urinary Tract: Adrenal glands appear stable and within normal limits. Chronic exophytic right lower pole renal cyst with simple fluid density. Chronic smaller left midpole renal cyst with simple fluid density. No hydronephrosis. 4 mm left renal lower pole calculus. No hydroureter. Unremarkable urinary bladder. Numerous pelvic phleboliths. Stomach/Bowel: Fluid in nondistended rectum. Redundant sigmoid colon mostly containing gas. Diverticulosis of the descending colon which is decompressed. Mild stool in the transverse colon which is partially decompressed. Mildly redundant right colon containing gas and stool. Normal appendix on series 2, image 81. No large bowel inflammation. No dilated small bowel. Moderately air in fluid distended stomach, but the gastric antrum and proximal duodenum appear unremarkable. Decompressed duodenum. No free air. No free fluid. Vascular/Lymphatic: Normal caliber aorta. Mild calcified plaque. Vascular patency is not evaluated in the absence of IV contrast. No lymphadenopathy is evident. Reproductive: Negative. Other: Mild dependent body wall edema. Trace presacral stranding or fluid. Musculoskeletal: Chronic lumbar spine ankylosis. But the SI joints remain relatively patent. Advanced degenerative changes at both hips. No acute or suspicious osseous lesion identified. IMPRESSION: 1. Left lower lobe opacity suspicious for Bronchopneumonia. Consider also aspiration. No pleural effusion. 2. Otherwise no acute or malignant process identified in the noncontrast chest, abdomen, or pelvis. 3. Chronic spinal ankylosis. Left nephrolithiasis. Descending colon  diverticulosis. Electronically Signed   By: Genevie Ann M.D.   On: 04/28/2020 21:04   VAS [place] IVC/ILIAC (VENOUS ONLY)  Result Date: 04/29/2020 IVC/ILIAC STUDY Indications: Right common femoral vein mobile DVT Other Factors: Hypoxia, elevated d-dimer. Limitations: Air/bowel gas, patient discomfort, patient position, and poor patient cooperation.  Comparison Study: No prior study Performing Technologist: Maudry Mayhew MHA, RDMS, RVT, RDCS  Examination Guidelines: A complete evaluation includes B-mode imaging, spectral Doppler, color Doppler, and power Doppler as needed of all accessible portions of each vessel. Bilateral testing is considered an integral part of a complete examination. Limited examinations for reoccurring indications may be performed as noted.  +----------------+---------+-----------+---------+-----------+--------+       CIV       RT-PatentRT-ThrombusLT-PatentLT-ThrombusComments +----------------+---------+-----------+---------+-----------+--------+ Common Iliac Mid patent              patent                      +----------------+---------+-----------+---------+-----------+--------+  +-----------------------+---------+-----------+---------+-----------+--------+           EIV          RT-PatentRT-ThrombusLT-PatentLT-ThrombusComments +-----------------------+---------+-----------+---------+-----------+--------+ External Iliac Vein Mid patent              patent                      +-----------------------+---------+-----------+---------+-----------+--------+   Summary: IVC/Iliac: No obvious evidence of occlusive thrombus in bilateral iliac veins. Unable to visualize IVC due to technical limitations.  *See table(s) above for measurements and observations.  Electronically signed by Harold Barban MD on 04/29/2020 at 1:05:29 PM.   Final    ECHOCARDIOGRAM COMPLETE  Result Date: 04/29/2020    ECHOCARDIOGRAM REPORT   Patient Name:   PATTRICK BADY Date of Exam: 04/29/2020  Medical Rec #:  [id number]     Height:       72.0 in Accession #:    [id number]    Weight:       212.5 lb Date of Birth:  1946/03/13  BSA:          2.186 m Patient Age:    81 years      BP:           145/73 mmHg Patient Gender: M             HR:           90 bpm. Exam Location:  Inpatient Procedure: 2D Echo, Cardiac Doppler, Color Doppler and Intracardiac            Opacification Agent Indications:    CHF-Acute Systolic 409.81 / X91.47  History:        Patient has no prior history of Echocardiogram examinations.                 Risk Factors:Hypertension, Diabetes, Dyslipidemia and Former                 Smoker.  Sonographer:    Vickie Epley RDCS Referring Phys: [id number] [place]  Sonographer Comments: No subcostal window. Attempted definity but it did not work. IMPRESSIONS  1. Left ventricular ejection fraction, by estimation, is 60 to 65%. The left ventricle has normal function. The left ventricle has no regional wall motion abnormalities. Left ventricular diastolic parameters are indeterminate.  2. RV is not well visualized. Grossly appears moderately enlarged, mild to moderate systolic dysfunction. . Right ventricular systolic function was not well visualized. The right ventricular size is not well visualized.  3. The mitral valve is normal in structure. No evidence of mitral valve regurgitation. No evidence of mitral stenosis.  4. The aortic valve is tricuspid. There is mild calcification of the aortic valve. There is mild thickening of the aortic valve. Aortic valve regurgitation is not visualized. No aortic stenosis is present. FINDINGS  Left Ventricle: LVOT VTI appears inaccurate based on angle of acquisition. Left ventricular ejection fraction, by estimation, is 60 to 65%. The left ventricle has normal function. The left ventricle has no regional wall motion abnormalities. The left ventricular internal cavity size was normal in size. There is no left ventricular hypertrophy. Left ventricular  diastolic parameters are indeterminate. Right Ventricle: RV is not well visualized. Grossly appears moderately enlarged, mild to moderate systolic dysfunction. The right ventricular size is not well visualized. Right vetricular wall thickness was not well visualized. Right ventricular systolic  function was not well visualized. Left Atrium: Left atrial size was normal in size. Right Atrium: Right atrial size was not well visualized. Pericardium: There is no evidence of pericardial effusion. Mitral Valve: The mitral valve is normal in structure. No evidence of mitral valve regurgitation. No evidence of mitral valve stenosis. Tricuspid Valve: The tricuspid valve is normal in structure. Tricuspid valve regurgitation is not demonstrated. No evidence of tricuspid stenosis. Aortic Valve: The aortic valve is tricuspid. There is mild calcification of the aortic valve. There is mild thickening of the aortic valve. There is mild aortic valve annular calcification. Aortic valve regurgitation is not visualized. No aortic stenosis  is present. Aortic valve mean gradient measures 1.4 mmHg. Aortic valve peak gradient measures 3.2 mmHg. Aortic valve area, by VTI measures 3.29 cm. Pulmonic Valve: The pulmonic valve was not well visualized. Pulmonic valve regurgitation is not visualized. No evidence of pulmonic stenosis. Aorta: The aortic root is normal in size and structure. Pulmonary Artery: Indeterminate PASP, inadequate TR jet. Venous: The inferior vena cava was not well visualized. IAS/Shunts: The interatrial septum was not well visualized.  LEFT VENTRICLE PLAX 2D LVIDd:  4.50 cm     Diastology LVIDs:         3.30 cm     LV e' medial:    8.05 cm/s LV PW:         0.70 cm     LV E/e' medial:  6.3 LV IVS:        0.70 cm     LV e' lateral:   9.14 cm/s LVOT diam:     2.30 cm     LV E/e' lateral: 5.5 LV SV:         42 LV SV Index:   19 LVOT Area:     4.15 cm  LV Volumes (MOD) LV vol d, MOD A4C: 74.7 ml LV vol s, MOD A4C: 33.8  ml LV SV MOD A4C:     74.7 ml RIGHT VENTRICLE RV S prime:     9.03 cm/s TAPSE (M-mode): 1.6 cm LEFT ATRIUM             Index       RIGHT ATRIUM           Index LA diam:        3.50 cm 1.60 cm/m  RA Area:     10.90 cm LA Vol (A2C):   18.7 ml 8.55 ml/m  RA Volume:   25.60 ml  11.71 ml/m LA Vol (A4C):   25.9 ml 11.85 ml/m LA Biplane Vol: 24.2 ml 11.07 ml/m  AORTIC VALVE AV Area (Vmax):    2.92 cm AV Area (Vmean):   3.43 cm AV Area (VTI):     3.29 cm AV Vmax:           89.29 cm/s AV Vmean:          54.930 cm/s AV VTI:            0.126 m AV Peak Grad:      3.2 mmHg AV Mean Grad:      1.4 mmHg LVOT Vmax:         62.70 cm/s LVOT Vmean:        45.300 cm/s LVOT VTI:          0.100 m LVOT/AV VTI ratio: 0.79  AORTA Ao Root diam: 3.10 cm MITRAL VALVE               TRICUSPID VALVE MV Area (PHT): 7.37 cm    TR Peak grad:   12.8 mmHg MV Decel Time: 103 msec    TR Vmax:        179.00 cm/s MV E velocity: 50.60 cm/s MV A velocity: 55.70 cm/s  SHUNTS MV E/A ratio:  0.91        Systemic VTI:  0.10 m                            Systemic Diam: 2.30 cm Carlyle Dolly MD Electronically signed by Carlyle Dolly MD Signature Date/Time: 04/29/2020/11:49:16 AM    Final    VAS [place] LOWER EXTREMITY VENOUS (DVT)  Result Date: 04/29/2020  Lower Venous DVTStudy Indications: Hypoxia, elevated d-dimer.  Limitations: Body habitus, poor patient cooperation/position, poor ultrasound/tissue interface. Comparison Study: No prior study Performing Technologist: Maudry Mayhew MHA, RDMS, RVT, RDCS  Examination Guidelines: A complete evaluation includes B-mode imaging, spectral Doppler, color Doppler, and power Doppler as needed of all accessible portions of each vessel. Bilateral testing is considered an integral part of a complete examination. Limited examinations for reoccurring indications may  be performed as noted. The reflux portion of the exam is performed with the patient in reverse Trendelenburg.   +---------+---------------+---------+-----------+----------+--------------+ RIGHT    CompressibilityPhasicitySpontaneityPropertiesThrombus Aging +---------+---------------+---------+-----------+----------+--------------+ CFV      None           Yes      Yes                  Acute mobile   +---------+---------------+---------+-----------+----------+--------------+ SFJ      Full                                                        +---------+---------------+---------+-----------+----------+--------------+ FV Prox                          Yes                                 +---------+---------------+---------+-----------+----------+--------------+ FV Mid                           Yes                                 +---------+---------------+---------+-----------+----------+--------------+ FV Distal                        Yes                                 +---------+---------------+---------+-----------+----------+--------------+ PFV                              No                   Acute          +---------+---------------+---------+-----------+----------+--------------+ POP                     Yes      Yes                                 +---------+---------------+---------+-----------+----------+--------------+ PTV                              No                   Acute          +---------+---------------+---------+-----------+----------+--------------+   Right Technical Findings: Not visualized segments include peroneal veins.  +---------+---------------+---------+-----------+----------+--------------+ LEFT     CompressibilityPhasicitySpontaneityPropertiesThrombus Aging +---------+---------------+---------+-----------+----------+--------------+ CFV      Full           Yes      Yes                                 +---------+---------------+---------+-----------+----------+--------------+ SFJ      Full                                                         +---------+---------------+---------+-----------+----------+--------------+  FV Prox  Full                                                        +---------+---------------+---------+-----------+----------+--------------+ FV Mid   Full                                                        +---------+---------------+---------+-----------+----------+--------------+ FV DistalFull                                                        +---------+---------------+---------+-----------+----------+--------------+ PFV      Full                                                        +---------+---------------+---------+-----------+----------+--------------+ POP      Full           Yes      Yes                                 +---------+---------------+---------+-----------+----------+--------------+   Left Technical Findings: Not visualized segments include PTV, peroneal veins.   Summary: RIGHT: - Findings consistent with acute deep vein thrombosis involving the right common femoral vein, and right posterior tibial vein. Thrombus in right common femoral vein is mobile. - No cystic structure found in the popliteal fossa.  LEFT: - There is no evidence of deep vein thrombosis in the lower extremity. However, portions of this examination were limited- see technologist comments above.  - No cystic structure found in the popliteal fossa.  *See table(s) above for measurements and observations. Electronically signed by Harold Barban MD on 04/29/2020 at 1:04:02 PM.    Final    VAS [place] UPPER EXTREMITY VENOUS DUPLEX  Result Date: 04/29/2020 UPPER VENOUS STUDY  Indications: Edema, and Hypoxia, elevated d-dimer Limitations: Body habitus, poor ultrasound/tissue interface, patient position/contracture, poor patient cooperation. Comparison Study: No prior study Performing Technologist: Maudry Mayhew MHA, RDMS, RVT, RDCS  Examination Guidelines: A complete evaluation  includes B-mode imaging, spectral Doppler, color Doppler, and power Doppler as needed of all accessible portions of each vessel. Bilateral testing is considered an integral part of a complete examination. Limited examinations for reoccurring indications may be performed as noted.  Right Findings: +----------+------------+---------+-----------+----------+-------+ RIGHT     CompressiblePhasicitySpontaneousPropertiesSummary +----------+------------+---------+-----------+----------+-------+ IJV           Full       Yes       Yes                      +----------+------------+---------+-----------+----------+-------+ Subclavian    Full       Yes       Yes                      +----------+------------+---------+-----------+----------+-------+  Axillary      Full       Yes       Yes                      +----------+------------+---------+-----------+----------+-------+ Brachial      Full       Yes       Yes                      +----------+------------+---------+-----------+----------+-------+ Radial        Full                                          +----------+------------+---------+-----------+----------+-------+ Cephalic      Full                                          +----------+------------+---------+-----------+----------+-------+ Basilic       Full                                          +----------+------------+---------+-----------+----------+-------+ Unable to visualize right ulnar veins due to technical limitations  Left Findings: +----------+------------+---------+-----------+----------+-------+ LEFT      CompressiblePhasicitySpontaneousPropertiesSummary +----------+------------+---------+-----------+----------+-------+ IJV           Full       Yes       Yes                      +----------+------------+---------+-----------+----------+-------+ Subclavian    Full       Yes       Yes                       +----------+------------+---------+-----------+----------+-------+ Radial        Full                                          +----------+------------+---------+-----------+----------+-------+ Unable to visualize left axillary, brachial, basilic, cephalic, and ulnar veins due to technical limitations.  Summary:  Right: No evidence of deep vein thrombosis in the upper extremity. No evidence of superficial vein thrombosis in the upper extremity.  Left: No obvious evidence of deep vein thrombosis involving the visualized veins of the upper extremity.  *See table(s) above for measurements and observations.  Diagnosing physician: Harold Barban MD Electronically signed by Harold Barban MD on 04/29/2020 at 1:04:23 PM.    Final      Shayle Donahoo T. [place]  If 7PM-7AM, please contact night-coverage [email or web address] 04/29/2020, 1:40 PM

## 2020-04-29 NOTE — ED Notes (Signed)
Dr. Cyndia Skeeters paged per vascular tech request.

## 2020-04-29 NOTE — Progress Notes (Signed)
[place] for Heparin drip Indication: mobile left femoral thrombus  No Known Allergies  Patient Measurements: Height: 6' (182.9 cm) Weight: 96.4 kg (212 lb 8.4 oz) IBW/kg (Calculated) : 77.6  Vital Signs: Temp: 98.6 F (37 C) (10/02 1148) Temp Source: Oral (10/02 1148) BP: 105/73 (10/02 1200) Pulse Rate: 92 (10/02 1200)  Labs: Recent Labs    04/28/20 1215 04/28/20 1215 04/28/20 1333 04/28/20 2134 04/29/20 0505  HGB 13.4   < > 14.6  --  11.5*  HCT 44.1  --  43.0  --  38.9*  PLT 221  --   --   --  152  APTT  --   --   --  27  --   LABPROT  --   --   --  12.0  --   INR  --   --   --  0.9  --   CREATININE 1.99*  --  2.10* 2.12*  --   TROPONINIHS  --   --   --  61*  --    < > = values in this interval not displayed.    Estimated Creatinine Clearance: 37.4 mL/min (A) (by C-G formula based on SCr of 2.12 mg/dL (H)).   Medical History: Past Medical History:  Diagnosis Date  . Arthritis    trouble turning head side to side and up  . Diabetes mellitus without complication ([place])   . Hemorrhoids   . High cholesterol   . Hypertension   . Prostate cancer [facility])     Assessment: 74 y/o M admitted with acute respiratory failure and altered mentation found to have a femoral thrombus. Pharmacy consulted to initiate heparin infusion. Last dose of Lovenox was last PM.   Goal of Therapy:  Heparin level 0.3-0.7 units/ml Monitor platelets by anticoagulation protocol: Yes   Plan:  Give 2500 units bolus x 1 Start heparin infusion at 1650 units/hr Check anti-Xa level in 8 hours and daily while on heparin Continue to monitor H&H and platelets  Ulice Dash D 04/29/2020,12:37 PM

## 2020-04-29 NOTE — Progress Notes (Signed)
ANTICOAGULATION CONSULT NOTE  Pharmacy Consult for Heparin drip Indication: mobile left femoral thrombus  No Known Allergies  Patient Measurements: Height: 6' (182.9 cm) Weight: 96.4 kg (212 lb 8.4 oz) IBW/kg (Calculated) : 77.6  Vital Signs: Temp: 98.4 F (36.9 C) (10/02 2219) Temp Source: Oral (10/02 2219) BP: 93/62 (10/02 2330) Pulse Rate: 100 (10/02 2330)  Labs: Recent Labs    04/28/20 1215 04/28/20 1215 04/28/20 1333 04/28/20 2134 04/29/20 0505 04/29/20 0910 04/29/20 2135  HGB 13.4   < > 14.6  --  11.5*  --   --   HCT 44.1  --  43.0  --  38.9*  --   --   PLT 221  --   --   --  152  --   --   APTT  --   --   --  27  --   --   --   LABPROT  --   --   --  12.0  --   --   --   INR  --   --   --  0.9  --   --   --   HEPARINUNFRC  --   --   --   --   --   --  <0.10*  CREATININE 1.99*   < > 2.10* 2.12*  --  1.73*  --   TROPONINIHS  --   --   --  61*  --   --   --    < > = values in this interval not displayed.    Estimated Creatinine Clearance: 45.8 mL/min (A) (by C-G formula based on SCr of 1.73 mg/dL (H)).   Medical History: Past Medical History:  Diagnosis Date  . Arthritis    trouble turning head side to side and up  . Diabetes mellitus without complication ([place])   . Hemorrhoids   . High cholesterol   . Hypertension   . Prostate cancer [facility])     Assessment: 74 y/o M admitted with acute respiratory failure and altered mentation found to have a femoral thrombus. Pharmacy consulted to initiate heparin infusion. Last dose of Lovenox was last PM.    21:35 HL = < 0.1 (subtherapeutic) with heparin infusing @ 1650 units/hr  Confirmed with RN no interruptions in therapy  No signs/symptoms of bleeding  Goal of Therapy:  Heparin level 0.3-0.7 units/ml Monitor platelets by anticoagulation protocol: Yes   Plan:  Rebolus Heparin 2000 units IV x 1 then increase heparin gtt to 1850 units/hr Check heparin level 8 hr after heparin rate increased Follow CBC and  heparin level daily  Mamadou Breon, Toribio Harbour, PharmD 04/29/2020,11:48 PM

## 2020-04-29 NOTE — Progress Notes (Signed)
NAME:  Jay Holloway, MRN:  [id number], DOB:  [birth year], LOS: 1 ADMISSION DATE:  04/28/2020, CONSULTATION DATE: 04/28/2020 REFERRING MD: Dr. Cyndia Skeeters, CHIEF COMPLAINT: Hypercapnic respiratory failure  Brief History    Elderly gentleman brought into the hospital for shortness of breath, altered mentation Somnolent at presentation Was placed on BiPAP but kept repeating BiPAP off Placed on oxygen supplementation  Information obtained from family by ED from nephew noted that patient is DNR and during a recent hospitalization was DNR/DNI History of present illness   Resides in a nursing facility, brought in for altered level of consciousness More somnolent at the nursing facility Was recently discharged about a week ago  Past Medical History   Past Medical History:  Diagnosis Date  . Arthritis    trouble turning head side to side and up  . Diabetes mellitus without complication ([place])   . Hemorrhoids   . High cholesterol   . Hypertension   . Prostate cancer ([place])    Significant Hospital Events   Was initially not able to leave BiPAP on However this morning when he was found [place], he has left BiPAP in place  Consults:  pccm  Procedures:  None  Significant Diagnostic Tests:  ABG-persistent hypercapnia CT head with no acute abnormality Chest x-ray 04/28/2020 reviewed by myself shows no infiltrative process  Micro Data:   None  Antimicrobials:  None  Interim history/subjective:  BiPAP in place Patient easily is arousable, attempts to have a conversation  Objective   Blood pressure (!) 88/60, pulse 81, temperature 98.6 F (37 C), resp. rate 17, height 6' (1.829 m), weight 96.4 kg, SpO2 97 %.    FiO2 (%):  [40 %] 40 %   Intake/Output Summary (Last 24 hours) at 04/29/2020 0941 Last data filed at 04/28/2020 2150 Gross per 24 hour  Intake 50 ml  Output --  Net 50 ml   Filed Weights   04/28/20 1143  Weight: 96.4 kg    Examination: General: Elderly, frail HENT:  Dry oral mucosa Lungs: Decreased air movement bilaterally Cardiovascular: S1-S2 appreciated Abdomen: Soft, bowel sounds appreciated Extremities: No clubbing, no edema Neuro: Lethargic, arousable GU:   ABG on BiPAP: 7.19/100/81  CBC with white count of 13.2 Resolved Hospital Problem list     Assessment & Plan:  Hypercapnic respiratory failure -PCO2 levels remain significantly high, responsible for his altered mentation -Continue BiPAP as long as is tolerated -Patient is a DNR, DNI from last admission -As long as he is able to leave BiPAP on I do believe this will help -Avoid sedating medications as best as possible -Continue bronchodilators  Chronic kidney disease -Trend electrolytes  History of diabetes History of prostate cancer History of diastolic heart failure -Hold diuretics at present and monitor electrolytes  Still with no signs of ongoing infection  Continue BiPAP as tolerated  Best practice:  Diet: N.p.o. based on current status Pain/Anxiety/Delirium protocol (if indicated): None VAP protocol (if indicated): Not indicated DVT prophylaxis: Lovenox GI prophylaxis: Protonix Code Status: DNR Family Communication: Friend was at bedside, Ms. Chapman Disposition: Stepdown unit  Labs   CBC: Recent Labs  Lab 04/28/20 1215 04/28/20 1333 04/29/20 0505  WBC 12.9*  --  13.2*  NEUTROABS 8.3*  --  10.4*  HGB 13.4 14.6 11.5*  HCT 44.1 43.0 38.9*  MCV 93.6  --  97.5  PLT 221  --  952    Basic Metabolic Panel: Recent Labs  Lab 04/28/20 1215 04/28/20 1236 04/28/20 1333 04/28/20 2134  NA  137  --  134* 138  K 5.3*  --  5.3* 5.1  CL 88*  --  89* 90*  CO2 37*  --   --  38*  GLUCOSE 286*  --  273* 248*  BUN 23  --  29* 26*  CREATININE 1.99*  --  2.10* 2.12*  CALCIUM 9.4  --   --  9.0  MG  --  1.5*  --   --   PHOS  --   --   --  7.1*   GFR: Estimated Creatinine Clearance: 37.4 mL/min (A) (by C-G formula based on SCr of 2.12 mg/dL (H)). Recent Labs  Lab  04/28/20 1215 04/28/20 1235 04/28/20 1435 04/29/20 0505  WBC 12.9*  --   --  13.2*  LATICACIDVEN  --  1.8 1.4  --     Liver Function Tests: Recent Labs  Lab 04/28/20 1215 04/28/20 2134  AST 48*  --   ALT 51*  --   ALKPHOS 100  --   BILITOT 0.9  --   PROT 7.2  --   ALBUMIN 3.9 3.4*   No results for input(s): LIPASE, AMYLASE in the last 168 hours. Recent Labs  Lab 04/28/20 1236  AMMONIA 12    ABG    Component Value Date/Time   PHART 7.195 (LL) 04/29/2020 0800   PCO2ART 99.9 (HH) 04/29/2020 0800   PO2ART 81.0 (L) 04/29/2020 0800   HCO3 37.1 (H) 04/29/2020 0800   TCO2 40 (H) 04/28/2020 1333   ACIDBASEDEF 10.1 (H) 11/06/2014 0335   O2SAT 94.3 04/29/2020 0800     Coagulation Profile: Recent Labs  Lab 04/28/20 2134  INR 0.9    Cardiac Enzymes: No results for input(s): CKTOTAL, CKMB, CKMBINDEX, TROPONINI in the last 168 hours.  HbA1C: Hgb A1c MFr Bld  Date/Time Value Ref Range Status  03/21/2020 05:00 PM 6.2 (H) 4.8 - 5.6 % Final    Comment:    RESULTS CONFIRMED BY MANUAL DILUTION REPEATED TO VERIFY (NOTE) Pre diabetes:          5.7%-6.4%  Diabetes:              >6.4%  Glycemic control for   <7.0% adults with diabetes   11/08/2014 03:00 AM 13.6 (H) 4.8 - 5.6 % Final    Comment:    (NOTE)         Pre-diabetes: 5.7 - 6.4         Diabetes: >6.4         Glycemic control for adults with diabetes: <7.0     CBG: Recent Labs  Lab 04/28/20 1320 04/28/20 2019 04/29/20 0139 04/29/20 0417 04/29/20 0819  GLUCAP 243* 248* 200* 162* 118*    Review of Systems:   He does attempt to interact Past Medical History  He,  has a past medical history of Arthritis, Diabetes mellitus without complication ([place]), Hemorrhoids, High cholesterol, Hypertension, and Prostate cancer ([place]).   Surgical History    Past Surgical History:  Procedure Laterality Date  . CHOLECYSTECTOMY N/A 11/11/2014   Procedure: LAPAROSCOPIC CHOLECYSTECTOMY WITH INTRAOPERATIVE  CHOLANGIOGRAM;  Surgeon: Erroll Luna, MD;  Location: [facility];  Service: General;  Laterality: N/A;  . COLONOSCOPY N/A 11/09/2014   Procedure: COLONOSCOPY;  Surgeon: Juanita Craver, MD;  [facility] ENDOSCOPY;  Service: Endoscopy;  Laterality: N/A;  . CYSTOSCOPY WITH RETROGRADE PYELOGRAM, URETEROSCOPY AND STENT PLACEMENT Left 04/18/2014   Procedure: CYSTOSCOPY WITH RETROGRADE PYELOGRAM, URETEROSCOPY AND STENT PLACEMENT;  Surgeon: Raynelle Bring, MD;  Location: [facility];  Service: Urology;  Laterality: Left;  . HOLMIUM LASER APPLICATION Left 01/19/7627   Procedure: HOLMIUM LASER APPLICATION;  Surgeon: Raynelle Bring, MD;  Location: [facility]S;  Service: Urology;  Laterality: Left;  . LYMPHADENECTOMY  06/04/2012   Procedure: LYMPHADENECTOMY;  Surgeon: Dutch Gray, MD;  Location: [facility] ORS;  Service: Urology;  Laterality: Bilateral;  . right  achilles tendon repair  yrs ago  . ROBOT ASSISTED LAPAROSCOPIC RADICAL PROSTATECTOMY  06/04/2012   Procedure: ROBOTIC ASSISTED LAPAROSCOPIC RADICAL PROSTATECTOMY LEVEL 3;  Surgeon: Dutch Gray, MD;  Location: [facility]S;  Service: Urology;  Laterality: N/A;     Social History   reports that he quit smoking about 41 years ago. His smoking use included cigarettes. He has a 1.00 pack-year smoking history. He has never used smokeless tobacco. He reports previous alcohol use. He reports previous drug use. Drug: Marijuana.   Family History   His Family history is unknown by patient.   Allergies No Known Allergies   Home Medications  Prior to Admission medications   Medication Sig Start Date End Date Taking? Authorizing Provider  atorvastatin (LIPITOR) 10 MG tablet Take 10 mg by mouth daily.  04/21/18  Yes [provider]  carvedilol (COREG) 6.25 MG tablet Take 1 tablet (6.25 mg total) by mouth 2 (two) times daily with a meal. 04/19/20  Yes Annita Brod, MD  Cholecalciferol (VITAMIN D3) 25 MCG (1000 UT) CAPS Take 1,000 Units by mouth daily.  10/15/18  Yes [provider]  feeding supplement, GLUCERNA SHAKE, (GLUCERNA SHAKE) LIQD Take 237 mLs by mouth 3 (three) times daily between meals.   Yes [provider]  furosemide (LASIX) 20 MG tablet Take 20 mg by mouth daily as needed for edema.   Yes [provider]  HYDROcodone-acetaminophen (NORCO) 7.5-325 MG tablet Take 1 tablet by mouth See admin instructions. 1 tablet three times daily And 1 tablet every 8 hours as needed for chronic pain   Yes [provider]  insulin glargine (LANTUS) 100 UNIT/ML injection Inject 0.1 mLs (10 Units total) into the skin daily. Give only 5 units for am glucose less than 90. 03/25/20  Yes Swayze, Ava, DO  loperamide (IMODIUM) 2 MG capsule Take 2 mg by mouth See admin instructions. 2mg  daily  And 2mg  every 4 hours as needed for diarrhea 04/28/20 04/30/20 Yes [provider]  melatonin 3 MG TABS tablet Take 1 tablet (3 mg total) by mouth at bedtime. 03/24/20  Yes Swayze, Ava, DO  metFORMIN (GLUCOPHAGE-XR) 500 MG 24 hr tablet Take 500 mg by mouth in the morning and at bedtime.  10/15/18  Yes [provider]  Multiple Vitamins-Minerals (MULTIVITAMIN ADULTS) TABS Take 1 tablet by mouth daily.   Yes [provider]  ondansetron (ZOFRAN) 4 MG tablet Take 4 mg by mouth 3 (three) times daily before meals. 04/26/20 04/29/20 Yes [provider]  pantoprazole (PROTONIX) 40 MG tablet Take 1 tablet (40 mg total) by mouth 2 (two) times daily. 03/24/20  Yes Swayze, Ava, DO  potassium chloride 20 MEQ TBCR Take 20 mEq by mouth daily. 03/24/20 04/28/20 Yes Swayze, Ava, DO  Semaglutide (OZEMPIC, 1 MG/DOSE, [place]) Inject 1 mg into the skin once a week. Wed   Yes [provider]  collagenase (SANTYL) ointment Apply topically daily. Apply Santyl to coccyx wound in a thick layer. Cover with a saline moistened gauze, then dry gauze..  Change daily. Patient not taking: Reported on 04/28/2020 04/20/20   Annita Brod,  MD  furosemide  (LASIX) 40 MG tablet Take 0.5 tablets (20 mg total) by mouth daily. fluid Patient not taking: Reported on 04/28/2020 03/24/20   Karie Kirks, DO    Sherrilyn Rist, MD [facility] Pager: [phone]

## 2020-04-29 NOTE — ED Notes (Signed)
Respiratory at bedside.

## 2020-04-29 NOTE — ED Notes (Addendum)
Upon entering patient's room, patient noted to be hypotensive and obtunded. Upon retrieving MD, patient noted to be agonal. Family notified by MD, MD at bedside

## 2020-04-29 NOTE — Progress Notes (Signed)
Removed PT from BiPAP at 1250 and placed on 3 LPM nasal cannula. PT is alert and oriented, able to follow simple commands, no respiratory distress noted at this time. RN aware and agrees to contact RT if condition changes.

## 2020-04-29 NOTE — Progress Notes (Signed)
Removed PT from BiPAP and placed n 5 lpm nasal cannula. ABG improving but PT became slightly agitated and wanted to take mask off for break. PT and RN aware that if he becomes SOB and / or confused to call RT.

## 2020-04-29 NOTE — ED Notes (Signed)
Pt had run of vtach during shift change at 709, lasting 20 seconds. Pt now alert, able to follow commands. Admitting MD notified. No changes at this time, we will continue to monitor patient.

## 2020-04-29 NOTE — ED Notes (Signed)
US at bedside

## 2020-04-29 NOTE — Consult Note (Signed)
Consultation Note Date: 04/29/2020   Patient Name: Jay Holloway  DOB: 1945-08-22  MRN: [id number]  Age / Sex: 74 y.o., male  PCP: [name], MD Referring Physician: Mercy Riding, MD  Reason for Consultation: Establishing goals of care  HPI/Patient Profile: 74 y.o. male  admitted on 04/28/2020   Clinical Assessment and Goals of Care: 74 year old gentleman brought in from nursing facility.  Has past medical history of diabetes hypertension diastolic heart failure chronic kidney disease prostate cancer.  He was hospitalized towards the end of September for possible DKA.  He was discharged to skilled nursing facility.  He has been admitted with hypercapnic respiratory failure.  Off-and-on he has required BiPAP.  Off-and-on he has had encephalopathy.  PCCM has also been following.  Palliative consultation for goals of care discussions has been requested. Additional work-up has been done showing DVT in lower extremity.  ABG has been requested results are pending.  Previous ABGs show hypercapnic respiratory failure. Patient is resting in bed on a stretcher in the emergency department.  He is on BiPAP.  He does not arouse to sternal rub.  Call placed and discussed with nephew at [phone].  I introduced myself and palliative care as follows: Palliative medicine is specialized medical care for people living with serious illness. It focuses on providing relief from the symptoms and stress of a serious illness. The goal is to improve quality of life for both the patient and the family.  Goals of care: Broad aims of medical therapy in relation to the patient's values and preferences. Our aim is to provide medical care aimed at enabling patients to achieve the goals that matter most to them, given the circumstances of their particular medical situation and their constraints.   Brief life review performed.  Patient has a  significant other.  He is not married.  He does not have any children.  His closest next of kin is his nephew.  We discussed about his chronic conditions, acute factors going on in this hospitalization.  Discussed frankly but compassionately that he is seriously critically ill with high risk of ongoing decline decompensation and even death.  We discussed about current medical measures such as use of BiPAP.  NEXT OF KIN Has a friend, significant other.  Closest relative is nephew at [phone].  SUMMARY OF RECOMMENDATIONS   DNR/DNI Continue current mode of care, BiPAP as needed. Add low-dose IV Dilaudid as needed to be used for pain or shortness of breath. Worried that the patient is on an ongoing decline trajectory in spite of current interventions.  Anticipated hospital death is of a very high likelihood in this situation. Thank you for the consult.  Code Status/Advance Care Planning:  DNR    Symptom Management:    as above.   Palliative Prophylaxis:   Delirium Protocol  Psycho-social/Spiritual:   Desire for further Chaplaincy support:yes  Additional Recommendations: Caregiving  Support/Resources  Prognosis:   Unable to determine  Discharge Planning: To Be Determined      Primary Diagnoses: Present  on Admission: . Acute metabolic encephalopathy . AKI (acute kidney injury) ([place]) . Acute respiratory failure with hypoxia and hypercarbia ([facility]) . Hyperkalemia . Hypomagnesemia . Mixed diabetic hyperlipidemia associated with type 2 diabetes mellitus ([place])   I have reviewed the medical record, interviewed the patient and family, and examined the patient. The following aspects are pertinent.  Past Medical History:  Diagnosis Date  . Arthritis    trouble turning head side to side and up  . Diabetes mellitus without complication ([place])   . Hemorrhoids   . High cholesterol   . Hypertension   . Prostate cancer [facility])    Social History   Socioeconomic History  .  Marital status: Single    Spouse name: Not on file  . Number of children: Not on file  . Years of education: Not on file  . Highest education level: Not on file  Occupational History  . Occupation: retired  Tobacco Use  . Smoking status: Former Smoker    Packs/day: 0.25    Years: 4.00    Pack years: 1.00    Types: Cigarettes    Quit date: 12/16/1978    Years since quitting: 41.3  . Smokeless tobacco: Never Used  Substance and Sexual Activity  . Alcohol use: Not Currently    Comment: remote h/o heavy use  . Drug use: Not Currently    Types: Marijuana    Comment: 1970s  . Sexual activity: Not on file  Other Topics Concern  . Not on file  Social History Narrative  . Not on file   Social Determinants of Health   Financial Resource Strain:   . Difficulty of Paying Living Expenses: Not on file  Food Insecurity:   . Worried About Charity fundraiser in the Last Year: Not on file  . Ran Out of Food in the Last Year: Not on file  Transportation Needs:   . Lack of Transportation (Medical): Not on file  . Lack of Transportation (Non-Medical): Not on file  Physical Activity:   . Days of Exercise per Week: Not on file  . Minutes of Exercise per Session: Not on file  Stress:   . Feeling of Stress : Not on file  Social Connections:   . Frequency of Communication with Friends and Family: Not on file  . Frequency of Social Gatherings with Friends and Family: Not on file  . Attends Religious Services: Not on file  . Active Member of Clubs or Organizations: Not on file  . Attends Archivist Meetings: Not on file  . Marital Status: Not on file   Family History  Family history unknown: Yes   Scheduled Meds: . insulin aspart  0-20 Units Subcutaneous Q4H  . insulin glargine  10 Units Subcutaneous Daily  . ipratropium-albuterol  3 mL Nebulization Q6H WA  . pantoprazole (PROTONIX) IV  40 mg Intravenous Q12H   Continuous Infusions: . azithromycin Stopped (04/29/20 1117)   . cefTRIAXone (ROCEPHIN)  IV Stopped (04/29/20 0136)  . heparin 1,650 Units/hr (04/29/20 1358)   PRN Meds:.acetaminophen **OR** acetaminophen, albuterol, hydrALAZINE, HYDROmorphone (DILAUDID) injection, ondansetron **OR** ondansetron (ZOFRAN) IV Medications Prior to Admission:  Prior to Admission medications   Medication Sig Start Date End Date Taking? Authorizing Provider  atorvastatin (LIPITOR) 10 MG tablet Take 10 mg by mouth daily.  04/21/18  Yes [provider]  carvedilol (COREG) 6.25 MG tablet Take 1 tablet (6.25 mg total) by mouth 2 (two) times daily with a meal. 04/19/20  Yes [place] Pink,  Trenton Gammon, MD  Cholecalciferol (VITAMIN D3) 25 MCG (1000 UT) CAPS Take 1,000 Units by mouth daily.  10/15/18  Yes [provider]  feeding supplement, GLUCERNA SHAKE, (GLUCERNA SHAKE) LIQD Take 237 mLs by mouth 3 (three) times daily between meals.   Yes [provider]  furosemide (LASIX) 20 MG tablet Take 20 mg by mouth daily as needed for edema.   Yes [provider]  HYDROcodone-acetaminophen (NORCO) 7.5-325 MG tablet Take 1 tablet by mouth See admin instructions. 1 tablet three times daily And 1 tablet every 8 hours as needed for chronic pain   Yes [provider]  insulin glargine (LANTUS) 100 UNIT/ML injection Inject 0.1 mLs (10 Units total) into the skin daily. Give only 5 units for am glucose less than 90. 03/25/20  Yes Swayze, Ava, DO  loperamide (IMODIUM) 2 MG capsule Take 2 mg by mouth See admin instructions. 2mg  daily  And 2mg  every 4 hours as needed for diarrhea 04/28/20 04/30/20 Yes [provider]  melatonin 3 MG TABS tablet Take 1 tablet (3 mg total) by mouth at bedtime. 03/24/20  Yes Swayze, Ava, DO  metFORMIN (GLUCOPHAGE-XR) 500 MG 24 hr tablet Take 500 mg by mouth in the morning and at bedtime.  10/15/18  Yes [provider]  Multiple Vitamins-Minerals (MULTIVITAMIN ADULTS) TABS Take 1 tablet by mouth daily.   Yes [provider]  ondansetron (ZOFRAN) 4 MG tablet Take 4 mg by mouth 3 (three) times daily before meals. 04/26/20 04/29/20 Yes [provider]  pantoprazole (PROTONIX) 40 MG tablet Take 1 tablet (40 mg total) by mouth 2 (two) times daily. 03/24/20  Yes Swayze, Ava, DO  potassium chloride 20 MEQ TBCR Take 20 mEq by mouth daily. 03/24/20 04/28/20 Yes Swayze, Ava, DO  Semaglutide (OZEMPIC, 1 MG/DOSE, [place]) Inject 1 mg into the skin once a week. Wed   Yes [provider]  collagenase (SANTYL) ointment Apply topically daily. Apply Santyl to coccyx wound in a thick layer. Cover with a saline moistened gauze, then dry gauze..  Change daily. Patient not taking: Reported on 04/28/2020 04/20/20   Annita Brod, MD  furosemide (LASIX) 40 MG tablet Take 0.5 tablets (20 mg total) by mouth daily. fluid Patient not taking: Reported on 04/28/2020 03/24/20   Swayze, Ava, DO   No Known Allergies Review of Systems Nonverbal, nonresponsive to me Physical Exam 74 year old gentleman resting in a stretcher in the emergency department, currently on BiPAP Does not respond to sternal rub Has some jerking movements both upper and lower extremities Coarse breath sounds anterior lung fields, has edema both lower extremities. S1-S2 Abdomen is not distended Patient is not awake not alert not oriented Does not verbalize  Vital Signs: BP 111/69   Pulse 88   Temp 98.6 F (37 C) (Oral)   Resp 13   Ht 6' (1.829 m)   Wt 96.4 kg   SpO2 97%   BMI 28.82 kg/m  Pain Scale: 0-10   Pain Score: 0-No pain   SpO2: SpO2: 97 % O2 Device:SpO2: 97 % O2 Flow Rate: .O2 Flow Rate (L/min): 3 L/min  IO: Intake/output summary:   Intake/Output Summary (Last 24 hours) at 04/29/2020 1501 Last data filed at 04/28/2020 2150 Gross per 24 hour  Intake 50 ml  Output --  Net 50 ml    LBM:   Baseline Weight: Weight: 96.4 kg Most recent weight: Weight: 96.4 kg     Palliative Assessment/Data:   PPS 20%  Time In:  1400 Time Out:  1500 Time Total:   60  Greater than 50%  of this time was spent counseling and coordinating care related to the above assessment and plan.  Signed by: Loistine Chance, MD   Please contact Palliative Medicine Team phone at [phone] for questions and concerns.  For individual provider: See Shea Evans

## 2020-04-29 NOTE — ED Notes (Signed)
Patient changed into gown, bed sheets changed, and warm blankets provided to patient.

## 2020-04-30 ENCOUNTER — Inpatient Hospital Stay (HOSPITAL_COMMUNITY): Payer: Medicare HMO

## 2020-04-30 DIAGNOSIS — R319 Hematuria, unspecified: Secondary | ICD-10-CM

## 2020-04-30 DIAGNOSIS — A419 Sepsis, unspecified organism: Secondary | ICD-10-CM

## 2020-04-30 DIAGNOSIS — J156 Pneumonia due to other aerobic Gram-negative bacteria: Secondary | ICD-10-CM

## 2020-04-30 DIAGNOSIS — I2699 Other pulmonary embolism without acute cor pulmonale: Secondary | ICD-10-CM

## 2020-04-30 DIAGNOSIS — N390 Urinary tract infection, site not specified: Secondary | ICD-10-CM

## 2020-04-30 DIAGNOSIS — R6520 Severe sepsis without septic shock: Secondary | ICD-10-CM

## 2020-04-30 LAB — COMPREHENSIVE METABOLIC PANEL
ALT: 39 U/L (ref 0–44)
AST: 39 U/L (ref 15–41)
Albumin: 2.7 g/dL — ABNORMAL LOW (ref 3.5–5.0)
Alkaline Phosphatase: 79 U/L (ref 38–126)
Anion gap: 11 (ref 5–15)
BUN: 27 mg/dL — ABNORMAL HIGH (ref 8–23)
CO2: 35 mmol/L — ABNORMAL HIGH (ref 22–32)
Calcium: 8.3 mg/dL — ABNORMAL LOW (ref 8.9–10.3)
Chloride: 94 mmol/L — ABNORMAL LOW (ref 98–111)
Creatinine, Ser: 1.44 mg/dL — ABNORMAL HIGH (ref 0.61–1.24)
GFR calc Af Amer: 55 mL/min — ABNORMAL LOW (ref >60)
GFR calc non Af Amer: 48 mL/min — ABNORMAL LOW (ref >60)
Glucose, Bld: 71 mg/dL (ref 70–99)
Potassium: 4.8 mmol/L (ref 3.5–5.1)
Sodium: 140 mmol/L (ref 135–145)
Total Bilirubin: 0.8 mg/dL (ref 0.3–1.2)
Total Protein: 5.2 g/dL — ABNORMAL LOW (ref 6.5–8.1)

## 2020-04-30 LAB — BLOOD GAS, VENOUS
Acid-Base Excess: 9 mmol/L — ABNORMAL HIGH (ref 0.0–2.0)
Bicarbonate: 37.4 mmol/L — ABNORMAL HIGH (ref 20.0–28.0)
O2 Saturation: 98.1 %
Patient temperature: 98.6
pCO2, Ven: 78.7 mmHg (ref 44.0–60.0)
pH, Ven: 7.298 (ref 7.250–7.430)
pO2, Ven: 129 mmHg — ABNORMAL HIGH (ref 32.0–45.0)

## 2020-04-30 LAB — RENAL FUNCTION PANEL
Albumin: 2.7 g/dL — ABNORMAL LOW (ref 3.5–5.0)
Anion gap: 11 (ref 5–15)
BUN: 26 mg/dL — ABNORMAL HIGH (ref 8–23)
CO2: 34 mmol/L — ABNORMAL HIGH (ref 22–32)
Calcium: 8.1 mg/dL — ABNORMAL LOW (ref 8.9–10.3)
Chloride: 94 mmol/L — ABNORMAL LOW (ref 98–111)
Creatinine, Ser: 1.45 mg/dL — ABNORMAL HIGH (ref 0.61–1.24)
GFR calc Af Amer: 55 mL/min — ABNORMAL LOW (ref >60)
GFR calc non Af Amer: 47 mL/min — ABNORMAL LOW (ref >60)
Glucose, Bld: 73 mg/dL (ref 70–99)
Phosphorus: 3.9 mg/dL (ref 2.5–4.6)
Potassium: 4.8 mmol/L (ref 3.5–5.1)
Sodium: 139 mmol/L (ref 135–145)

## 2020-04-30 LAB — CBC
HCT: 33.7 % — ABNORMAL LOW (ref 39.0–52.0)
Hemoglobin: 10.6 g/dL — ABNORMAL LOW (ref 13.0–17.0)
MCH: 29 pg (ref 26.0–34.0)
MCHC: 31.5 g/dL (ref 30.0–36.0)
MCV: 92.1 fL (ref 80.0–100.0)
Platelets: 126 10*3/uL — ABNORMAL LOW (ref 150–400)
RBC: 3.66 MIL/uL — ABNORMAL LOW (ref 4.22–5.81)
RDW: 14.8 % (ref 11.5–15.5)
WBC: 7.2 10*3/uL (ref 4.0–10.5)
nRBC: 0 % (ref 0.0–0.2)

## 2020-04-30 LAB — GLUCOSE, CAPILLARY
Glucose-Capillary: 76 mg/dL (ref 70–99)
Glucose-Capillary: 120 mg/dL — ABNORMAL HIGH (ref 70–99)
Glucose-Capillary: 142 mg/dL — ABNORMAL HIGH (ref 70–99)
Glucose-Capillary: 151 mg/dL — ABNORMAL HIGH (ref 70–99)
Glucose-Capillary: 162 mg/dL — ABNORMAL HIGH (ref 70–99)

## 2020-04-30 LAB — HEPARIN LEVEL (UNFRACTIONATED): Heparin Unfractionated: 1.1 IU/mL — ABNORMAL HIGH (ref 0.30–0.70)

## 2020-04-30 LAB — MRSA PCR SCREENING: MRSA by PCR: NEGATIVE

## 2020-04-30 LAB — MAGNESIUM: Magnesium: 1.5 mg/dL — ABNORMAL LOW (ref 1.7–2.4)

## 2020-04-30 LAB — CBG MONITORING, ED: Glucose-Capillary: 68 mg/dL — ABNORMAL LOW (ref 70–99)

## 2020-04-30 MED ORDER — COLLAGENASE 250 UNIT/GM EX OINT
TOPICAL_OINTMENT | Freq: Every day | CUTANEOUS | Status: DC
  Filled 2020-04-30 (×2): qty 30

## 2020-04-30 MED ORDER — HALOPERIDOL LACTATE 5 MG/ML IJ SOLN
2.0000 mg | Freq: Four times a day (QID) | INTRAMUSCULAR | Status: DC | PRN
  Administered 2020-05-01 (×2): 2 mg via INTRAVENOUS
  Filled 2020-04-30 (×2): qty 1

## 2020-04-30 MED ORDER — CHLORHEXIDINE GLUCONATE CLOTH 2 % EX PADS
6.0000 | MEDICATED_PAD | Freq: Every day | CUTANEOUS | Status: DC
  Administered 2020-04-30 – 2020-05-05 (×5): 6 via TOPICAL

## 2020-04-30 MED ORDER — SODIUM CHLORIDE 0.9% FLUSH: 10.0000 mL | INTRAVENOUS | Status: DC | PRN

## 2020-04-30 MED ORDER — MAGNESIUM SULFATE 2 GM/50ML IV SOLN
2.0000 g | Freq: Once | INTRAVENOUS | Status: AC
  Administered 2020-04-30: 2 g via INTRAVENOUS
  Filled 2020-04-30: qty 50

## 2020-04-30 MED ORDER — IPRATROPIUM-ALBUTEROL 0.5-2.5 (3) MG/3ML IN SOLN
3.0000 mL | Freq: Three times a day (TID) | RESPIRATORY_TRACT | Status: DC
  Administered 2020-04-30 – 2020-05-02 (×5): 3 mL via RESPIRATORY_TRACT
  Filled 2020-04-30 (×6): qty 3

## 2020-04-30 MED ORDER — SODIUM CHLORIDE 0.9% FLUSH
10.0000 mL | Freq: Two times a day (BID) | INTRAVENOUS | Status: DC
  Administered 2020-04-30 – 2020-05-05 (×10): 10 mL

## 2020-04-30 MED ORDER — DEXTROSE-NACL 5-0.9 % IV SOLN: INTRAVENOUS | Status: DC

## 2020-04-30 MED ORDER — ORAL CARE MOUTH RINSE
15.0000 mL | Freq: Two times a day (BID) | OROMUCOSAL | Status: DC
  Administered 2020-04-30 – 2020-05-05 (×10): 15 mL via OROMUCOSAL

## 2020-04-30 MED ORDER — IOHEXOL 350 MG/ML SOLN
100.0000 mL | Freq: Once | INTRAVENOUS | Status: AC | PRN
  Administered 2020-04-30: 100 mL via INTRAVENOUS

## 2020-04-30 MED ORDER — HEPARIN (PORCINE) 25000 UT/250ML-% IV SOLN
1550.0000 [IU]/h | INTRAVENOUS | Status: DC
  Filled 2020-04-30: qty 250

## 2020-04-30 NOTE — Progress Notes (Signed)
PROGRESS NOTE  Jay Holloway GNF:[id number] DOB: Mar 21, 1946   PCP: [name], MD  Patient is from: SNF  DOA: 04/28/2020 LOS: 2  Brief Narrative / Interim history: 74 year old male with history of dementia, prostate cancer, DM-2, COPD?  CHF, HTN, GERD and recent hospitalization from 9/17-9/22 with generalized weakness, mild DKA, respiratory failure and encephalopathy brought to ED from SNF with altered mental status, hypoxemia and generalized weakness, and admitted for acute hypoxic hypercapnic respiratory failure, pneumonia, AKI and acute metabolic encephalopathy.  Per EMS, hypoxic to 37% on RA.  Patient has no close family member other than his nephew as next of kin who does not process HC POA.   In ED, hypoxic to 44% on RA and recovered to upper 90s on 50 L by NRB.  Brief tachycardia and tachypnea.  Afebrile.  VBG 7.17/113/42/40.  ABG 7.1 6/117/102/40.5.  WBC 12.9 without left shift.  BNP 328.  Lactic acid negative.  K5.3. Cr 1.99.  BUN 23.  Bicarb 37.  Anion gap 12.  Ammonia within normal.  Borderline LFT. PCCM consulted recommended admission to medical service as patient is DNR and DNI.  Started on BiPAP.  Repeat ABG 7.23/96/77/39.  However, he was put on 3 L by nasal cannula overnight as he kept on removing the BiPAP.   The next morning, some improvement in his encephalopathy.  ABG 7.1 9/100/81/37.  Went back on BiPAP.  Echo with normal EF and no significant structural or functional abnormalities.  LE US revealed acute DVT with mobile thrombus in right common femoral vein, and right posterior tibial vein. UE [place] negative for DVT.  Patient was started on heparin drip.  CTA chest for small left lower lobe PE and left lower lobe pneumonia.   Subjective: Seen and examined earlier this morning.  He is awake but not quite alert.  Only oriented to self and partial place. He responds no to pain or shortness of breath.  He failed his swallowing eval.  Some acidosis with hypercapnia on VBG this  morning.  Wean oxygen to room air. CTA chest as above.  Objective: Vitals:   04/30/20 0200 04/30/20 0300 04/30/20 0400 04/30/20 0833  BP: (!) 120/42 (!) 108/44 (!) 115/48   Pulse: (!) 115 (!) 114 (!) 102   Resp: (!) 25 (!) 24 16   Temp: 99.3 F (37.4 C)   98.4 F (36.9 C)  TempSrc: Oral   Oral  SpO2: 100% 97% 96%   Weight:      Height:        Intake/Output Summary (Last 24 hours) at 04/30/2020 1146 Last data filed at 04/30/2020 0645 Gross per 24 hour  Intake 473.83 ml  Output 500 ml  Net -26.17 ml   Filed Weights   04/28/20 1143  Weight: 96.4 kg    Examination:  GENERAL: Frail looking elderly male.  HEENT: MMM.  Vision and hearing grossly intact.  NECK: Supple.  No apparent JVD.  RESP: On room air.  No IWOB.  Fair aeration bilaterally. CVS:  RRR. Heart sounds normal.  ABD/GI/GU: BS+. Abd soft, NTND.  MSK/EXT:  Moves extremities.  Swelling in LUE.  SKIN: no apparent skin lesion or wound NEURO: Awake but not alert.  Oriented to self and partial place.  No apparent focal neuro deficit but bilateral lower extremity weakness PSYCH: Calm.  No distress or agitation.  Procedures:  None  Microbiology summarized: COVID-19 PCR negative. Blood cultures NGTD. Urine culture pending.  Assessment & Plan: Acute respiratory failure with hypoxia and  hypercapnia-suggestive of obstructive etiology although no PFT for formal diagnosis of COPD.  Could have sleep apnea as well.  CTA chest with LLL pneumonia and  small LLL PE.  He has mobile RLE DVT.  Blood gas improved. -Continue intermittent and nightly BiPAP -Repeat ABG if respiratory or mental status worse -IV heparin for PE and DVT -Continue IV ceftriaxone and azithromycin for LLL pneumonia -Scheduled DuoNeb every 6 hours while awake -N.p.o. Failed SLP eval.  Acute LLL PE/acute RLE DVT: LE US revealed acute DVT with mobile thrombus in right common femoral vein, and right posterior tibial vein.  CTA showed acute small LLL  PE -Continue IV heparin drip  Severe sepsis due to LLL pneumonia/possible Serratia marcescens UTI: POA.  Met criteria on admission with leukocytosis, tachypnea and signs of endorgan damage as evidenced by encephalopathy and AKI.  Concern for gram-negative pneumonia due to aspiration.  Noted on CTA chest.  Urine culture with Serratia marcescens.  Blood cultures NGTD. -Continue coverage with IV ceftriaxone and azithromycin -Continue IV fluid -N.p.o. per SLP  Acute metabolic encephalopathy: Likely due to hypercapnia.  Ammonia within normal.  No focal neuro deficit but limited exam due to mental status. CT head without acute finding. -Treat treatable causes -Avoid sedating medications -Reorientation and delirium precautions  Mildly elevated troponin: High-sensitivity troponin 61.  EKG with sinus tachycardia, RBBB and LAFB, unchanged from baseline.  Elevated troponin likely demand ischemia from respiratory failure.   Acute kidney injury/mild azotemia: Baseline Cr 0.9-1.0> 1.99 (admit)> 2.12> 1.4.  Likely prerenal.  He was also on Lasix -Continue gentle IV fluid -Avoid nephrotoxic meds-hold Lasix.  DM-2 with hypo and hyperglycemia:  Recent Labs  Lab 04/29/20 1647 04/29/20 2127 04/30/20 0005 04/30/20 0204 04/30/20 0815  GLUCAP 101* 75 68* 76 120*  -Discontinue Lantus and change SSI to renal while n.p.o. -Monitor CBG -Check hemoglobin A1c  Essential hypertension: Soft blood pressures. -Continue IV fluid  Mild elevated LFT: Likely ischemic.  Resolved. -Continue monitoring  Hyperkalemia: Resolved.  Hypomagnesemia -Replenish and recheck  Leukocytosis with bandemia: Resolved.  DNR/DNI-per nephew. Patient has no close family member other that his nephew and friend.  -Appreciate help by PMT    Body mass index is 28.82 kg/m.         DVT prophylaxis:    Code Status: DNR/DNI-per Nephew  Family Communication: Updated patient's nephew, [name] and friend, Arbie Cookey over the phone  on 10/2. Status is: Inpatient  Remains inpatient appropriate because:Hemodynamically unstable, Altered mental status, IV treatments appropriate due to intensity of illness or inability to take PO and Inpatient level of care appropriate due to severity of illness   Dispo: The patient is from: SNF              Anticipated d/c is to: SNF              Anticipated d/c date is: > 3 days              Patient currently is not medically stable to d/c.       Consultants:  PCCM   Sch Meds:  Scheduled Meds: . Chlorhexidine Gluconate Cloth  6 each Topical Daily  . collagenase   Topical Daily  . insulin aspart  0-5 Units Subcutaneous QHS  . insulin aspart  0-9 Units Subcutaneous TID WC  . ipratropium-albuterol  3 mL Nebulization TID  . mouth rinse  15 mL Mouth Rinse BID  . pantoprazole (PROTONIX) IV  40 mg Intravenous Q12H  . sodium chloride flush  10-40 mL Intracatheter Q12H   Continuous Infusions: . azithromycin Stopped (04/30/20 0259)  . cefTRIAXone (ROCEPHIN)  IV Stopped (04/29/20 2318)  . dextrose 5 % and 0.9% NaCl 75 mL/hr at 04/30/20 0302  . heparin 1,850 Units/hr (04/30/20 0218)   PRN Meds:.acetaminophen **OR** acetaminophen, albuterol, hydrALAZINE, ondansetron **OR** ondansetron (ZOFRAN) IV, sodium chloride flush  Antimicrobials: Anti-infectives (From admission, onward)   Start     Dose/Rate Route Frequency Ordered Stop   04/28/20 2359  azithromycin (ZITHROMAX) 500 mg in sodium chloride 0.9 % 250 mL IVPB        500 mg 250 mL/hr over 60 Minutes Intravenous Every 24 hours 04/28/20 2344 05/03/20 2359   04/28/20 2230  cefTRIAXone (ROCEPHIN) 1 g in sodium chloride 0.9 % 100 mL IVPB        1 g 200 mL/hr over 30 Minutes Intravenous Every 24 hours 04/28/20 2200         I have personally reviewed the following labs and images: CBC: Recent Labs  Lab 04/28/20 1215 04/28/20 1333 04/29/20 0505 04/30/20 0226  WBC 12.9*  --  13.2* 7.2  NEUTROABS 8.3*  --  10.4*  --   HGB  13.4 14.6 11.5* 10.6*  HCT 44.1 43.0 38.9* 33.7*  MCV 93.6  --  97.5 92.1  PLT 221  --  152 126*   BMP &GFR Recent Labs  Lab 04/28/20 1215 04/28/20 1236 04/28/20 1333 04/28/20 2134 04/29/20 0910 04/30/20 0226  NA 137  --  134* 138 139 139  140  K 5.3*  --  5.3* 5.1 5.0 4.8  4.8  CL 88*  --  89* 90* 93* 94*  94*  CO2 37*  --   --  38* 37* 34*  35*  GLUCOSE 286*  --  273* 248* 100* 73  71  BUN 23  --  29* 26* 27* 26*  27*  CREATININE 1.99*  --  2.10* 2.12* 1.73* 1.45*  1.44*  CALCIUM 9.4  --   --  9.0 8.6* 8.1*  8.3*  MG  --  1.5*  --   --  1.7 1.5*  PHOS  --   --   --  7.1*  --  3.9   Estimated Creatinine Clearance: 55 mL/min (A) (by C-G formula based on SCr of 1.44 mg/dL (H)). Liver & Pancreas: Recent Labs  Lab 04/28/20 1215 04/28/20 2134 04/29/20 0910 04/30/20 0226  AST 48*  --  47* 39  ALT 51*  --  45* 39  ALKPHOS 100  --  83 79  BILITOT 0.9  --  0.7 0.8  PROT 7.2  --  6.1* 5.2*  ALBUMIN 3.9 3.4* 3.1* 2.7*  2.7*   No results for input(s): LIPASE, AMYLASE in the last 168 hours. Recent Labs  Lab 04/28/20 1236  AMMONIA 12   Diabetic: No results for input(s): HGBA1C in the last 72 hours. Recent Labs  Lab 04/29/20 1647 04/29/20 2127 04/30/20 0005 04/30/20 0204 04/30/20 0815  GLUCAP 101* 75 68* 76 120*   Cardiac Enzymes: No results for input(s): CKTOTAL, CKMB, CKMBINDEX, TROPONINI in the last 168 hours. No results for input(s): PROBNP in the last 8760 hours. Coagulation Profile: Recent Labs  Lab 04/28/20 2134  INR 0.9   Thyroid Function Tests: No results for input(s): TSH, T4TOTAL, FREET4, T3FREE, THYROIDAB in the last 72 hours. Lipid Profile: No results for input(s): CHOL, HDL, LDLCALC, TRIG, CHOLHDL, LDLDIRECT in the last 72 hours. Anemia Panel: Recent Labs    04/28/20 1215  VITAMINB12 626  FOLATE 13.5   Urine analysis:    Component Value Date/Time   COLORURINE AMBER (A) 04/28/2020 2016   APPEARANCEUR CLOUDY (A) 04/28/2020 2016    LABSPEC 1.020 04/28/2020 2016   PHURINE 5.0 04/28/2020 2016   GLUCOSEU NEGATIVE 04/28/2020 2016   HGBUR NEGATIVE 04/28/2020 2016   BILIRUBINUR NEGATIVE 04/28/2020 2016   KETONESUR NEGATIVE 04/28/2020 2016   PROTEINUR 100 (A) 04/28/2020 2016   UROBILINOGEN 0.2 11/05/2014 1712   NITRITE NEGATIVE 04/28/2020 2016   LEUKOCYTESUR LARGE (A) 04/28/2020 2016   Sepsis Labs: Invalid input(s): PROCALCITONIN, [place]  Microbiology: Recent Results (from the past 240 hour(s))  Blood culture (routine x 2)     Status: None (Preliminary result)   Collection Time: 04/28/20 12:30 PM   Specimen: BLOOD  Result Value Ref Range Status   Specimen Description   Final    BLOOD RIGHT ARM UPPER Performed at [facility], [place] [address] [place], [place] [address]    Special Requests   Final    BOTTLES DRAWN AEROBIC ONLY Blood Culture results may not be optimal due to an inadequate volume of blood received in culture bottles Performed at [facility] [address], [place], [place] [address]    Culture   Final    NO GROWTH 2 DAYS Performed at [facility] [address], [place], [place] [address]    Report Status PENDING  Incomplete  Blood culture (routine x 2)     Status: None (Preliminary result)   Collection Time: 04/28/20 12:41 PM   Specimen: BLOOD  Result Value Ref Range Status   Specimen Description   Final    BLOOD LEFT ARM UPPER Performed at [facility] [address], [place], [place] [address]    Special Requests   Final    BOTTLES DRAWN AEROBIC AND ANAEROBIC Blood Culture adequate volume Performed at [facility] [address], [place], [place] [address]    Culture   Final    NO GROWTH 2 DAYS Performed at [facility] [address], [place], [place] [address]    Report Status PENDING  Incomplete  Respiratory Panel by RT PCR (Flu A&B, Covid) - Nasopharyngeal Swab     Status: None    Collection Time: 04/28/20  1:21 PM   Specimen: Nasopharyngeal Swab  Result Value Ref Range Status   SARS Coronavirus 2 by RT PCR NEGATIVE NEGATIVE Final    Comment: (NOTE) SARS-CoV-2 target nucleic acids are NOT DETECTED.  The SARS-CoV-2 RNA is generally detectable in upper respiratoy specimens during the acute phase of infection. The lowest concentration of SARS-CoV-2 viral copies this assay can detect is 131 copies/mL. A negative result does not preclude SARS-Cov-2 infection and should not be used as the sole basis for treatment or other patient management decisions. A negative result may occur with  improper specimen collection/handling, submission of specimen other than nasopharyngeal swab, presence of viral mutation(s) within the areas targeted by this assay, and inadequate number of viral copies (<131 copies/mL). A negative result must be combined with clinical observations, patient history, and epidemiological information. The expected result is Negative.  Fact Sheet for Patients:  PinkCheek.be  Fact Sheet for Healthcare Providers:  GravelBags.it  This test is no t yet approved or cleared by the [place] FDA and  has been authorized for detection and/or diagnosis of SARS-CoV-2 by FDA under an Emergency Use Authorization (EUA). This EUA will remain  in  effect (meaning this test can be used) for the duration of the COVID-19 declaration under Section 564(b)(1) of the Act, 21 U.S.C. section 360bbb-3(b)(1), unless the authorization is terminated or revoked sooner.     Influenza A by PCR NEGATIVE NEGATIVE Final   Influenza B by PCR NEGATIVE NEGATIVE Final    Comment: (NOTE) The Xpert Xpress SARS-CoV-2/FLU/RSV assay is intended as an aid in  the diagnosis of influenza from Nasopharyngeal swab specimens and  should not be used as a sole basis for treatment. Nasal washings and  aspirates are unacceptable for Xpert  Xpress SARS-CoV-2/FLU/RSV  testing.  Fact Sheet for Patients: PinkCheek.be  Fact Sheet for Healthcare Providers: GravelBags.it  This test is not yet approved or cleared by the [place] FDA and  has been authorized for detection and/or diagnosis of SARS-CoV-2 by  FDA under an Emergency Use Authorization (EUA). This EUA will remain  in effect (meaning this test can be used) for the duration of the  Covid-19 declaration under Section 564(b)(1) of the Act, 21  U.S.C. section 360bbb-3(b)(1), unless the authorization is  terminated or revoked. Performed at [facility], [place] [address], [place], [place] [address]   Culture, Urine     Status: Abnormal (Preliminary result)   Collection Time: 04/28/20  8:16 PM   Specimen: Urine, Catheterized  Result Value Ref Range Status   Specimen Description   Final    URINE, CATHETERIZED Performed at [facility] [address], [place], [place] [address]    Special Requests   Final    NONE Performed at [facility], [place] [address] [place], [place] [address]    Culture >=100,000 COLONIES/mL SERRATIA MARCESCENS (A)  Final   Report Status PENDING  Incomplete    Radiology Studies: CT ANGIO CHEST PE W OR WO CONTRAST  Result Date: 04/30/2020 CLINICAL DATA:  Shortness of breath. EXAM: CT ANGIOGRAPHY CHEST WITH CONTRAST TECHNIQUE: Multidetector CT imaging of the chest was performed using the standard protocol during bolus administration of intravenous contrast. Multiplanar CT image reconstructions and MIPs were obtained to evaluate the vascular anatomy. CONTRAST:  155mL OMNIPAQUE IOHEXOL 350 MG/ML SOLN COMPARISON:  April 28, 2020. FINDINGS: Cardiovascular: Small filling defect is noted in lower lobe branch of left pulmonary artery consistent with acute pulmonary embolus. Normal cardiac size. No pericardial effusion is noted. There is no  evidence of thoracic aortic dissection or aneurysm. Mediastinum/Nodes: 1.2 cm left thyroid nodule is noted. Esophagus is unremarkable. No adenopathy is noted. Lungs/Pleura: No pneumothorax is noted. Small right pleural effusion is noted with adjacent right lower lobe subsegmental atelectasis. Large left lower lobe opacity is noted concerning for pneumonia or possibly atelectasis. Elevated right hemidiaphragm is noted. Upper Abdomen: No acute abnormality. Musculoskeletal: No chest wall abnormality. No acute or significant osseous findings. Review of the MIP images confirms the above findings. IMPRESSION: 1. Small filling defect is noted in lower lobe branch of left pulmonary artery consistent with acute pulmonary embolus. Critical Value/emergent results were called by telephone at the time of interpretation on 04/30/2020 at 10:03 am to provider [facility] , who verbally acknowledged these results. 2. Small right pleural effusion is noted with adjacent right lower lobe subsegmental atelectasis. Elevated right hemidiaphragm is noted. 3. Large left lower lobe opacity is noted concerning for pneumonia or possibly atelectasis. 1.2 cm left thyroid nodule is noted. Electronically Signed   By: Marijo Conception M.D.   On: 04/30/2020 10:03   VAS [place] IVC/ILIAC (VENOUS ONLY)  Result Date: 04/29/2020 IVC/ILIAC STUDY Indications: Right common femoral vein mobile DVT Other Factors: Hypoxia, elevated d-dimer. Limitations: Air/bowel gas, patient discomfort, patient position, and poor patient cooperation.  Comparison Study: No prior study Performing Technologist: Maudry Mayhew MHA, RDMS, RVT, RDCS  Examination Guidelines: A complete evaluation includes B-mode imaging, spectral Doppler, color Doppler, and power Doppler as needed of all accessible portions of each vessel. Bilateral testing is considered an integral part of a complete examination. Limited examinations for reoccurring indications may be performed as noted.   +----------------+---------+-----------+---------+-----------+--------+       CIV       RT-PatentRT-ThrombusLT-PatentLT-ThrombusComments +----------------+---------+-----------+---------+-----------+--------+ Common Iliac Mid patent              patent                      +----------------+---------+-----------+---------+-----------+--------+  +-----------------------+---------+-----------+---------+-----------+--------+           EIV          RT-PatentRT-ThrombusLT-PatentLT-ThrombusComments +-----------------------+---------+-----------+---------+-----------+--------+ External Iliac Vein Mid patent              patent                      +-----------------------+---------+-----------+---------+-----------+--------+   Summary: IVC/Iliac: No obvious evidence of occlusive thrombus in bilateral iliac veins. Unable to visualize IVC due to technical limitations.  *See table(s) above for measurements and observations.  Electronically signed by Harold Barban MD on 04/29/2020 at 1:05:29 PM.   Final    VAS [place] LOWER EXTREMITY VENOUS (DVT)  Result Date: 04/29/2020  Lower Venous DVTStudy Indications: Hypoxia, elevated d-dimer.  Limitations: Body habitus, poor patient cooperation/position, poor ultrasound/tissue interface. Comparison Study: No prior study Performing Technologist: Maudry Mayhew MHA, RDMS, RVT, RDCS  Examination Guidelines: A complete evaluation includes B-mode imaging, spectral Doppler, color Doppler, and power Doppler as needed of all accessible portions of each vessel. Bilateral testing is considered an integral part of a complete examination. Limited examinations for reoccurring indications may be performed as noted. The reflux portion of the exam is performed with the patient in reverse Trendelenburg.  +---------+---------------+---------+-----------+----------+--------------+ RIGHT    CompressibilityPhasicitySpontaneityPropertiesThrombus Aging  +---------+---------------+---------+-----------+----------+--------------+ CFV      None           Yes      Yes                  Acute mobile   +---------+---------------+---------+-----------+----------+--------------+ SFJ      Full                                                        +---------+---------------+---------+-----------+----------+--------------+ FV Prox                          Yes                                 +---------+---------------+---------+-----------+----------+--------------+ FV Mid                           Yes                                 +---------+---------------+---------+-----------+----------+--------------+  FV Distal                        Yes                                 +---------+---------------+---------+-----------+----------+--------------+ PFV                              No                   Acute          +---------+---------------+---------+-----------+----------+--------------+ POP                     Yes      Yes                                 +---------+---------------+---------+-----------+----------+--------------+ PTV                              No                   Acute          +---------+---------------+---------+-----------+----------+--------------+   Right Technical Findings: Not visualized segments include peroneal veins.  +---------+---------------+---------+-----------+----------+--------------+ LEFT     CompressibilityPhasicitySpontaneityPropertiesThrombus Aging +---------+---------------+---------+-----------+----------+--------------+ CFV      Full           Yes      Yes                                 +---------+---------------+---------+-----------+----------+--------------+ SFJ      Full                                                        +---------+---------------+---------+-----------+----------+--------------+ FV Prox  Full                                                         +---------+---------------+---------+-----------+----------+--------------+ FV Mid   Full                                                        +---------+---------------+---------+-----------+----------+--------------+ FV DistalFull                                                        +---------+---------------+---------+-----------+----------+--------------+ PFV      Full                                                        +---------+---------------+---------+-----------+----------+--------------+  POP      Full           Yes      Yes                                 +---------+---------------+---------+-----------+----------+--------------+   Left Technical Findings: Not visualized segments include PTV, peroneal veins.   Summary: RIGHT: - Findings consistent with acute deep vein thrombosis involving the right common femoral vein, and right posterior tibial vein. Thrombus in right common femoral vein is mobile. - No cystic structure found in the popliteal fossa.  LEFT: - There is no evidence of deep vein thrombosis in the lower extremity. However, portions of this examination were limited- see technologist comments above.  - No cystic structure found in the popliteal fossa.  *See table(s) above for measurements and observations. Electronically signed by Harold Barban MD on 04/29/2020 at 1:04:02 PM.    Final    VAS [place] UPPER EXTREMITY VENOUS DUPLEX  Result Date: 04/29/2020 UPPER VENOUS STUDY  Indications: Edema, and Hypoxia, elevated d-dimer Limitations: Body habitus, poor ultrasound/tissue interface, patient position/contracture, poor patient cooperation. Comparison Study: No prior study Performing Technologist: Maudry Mayhew MHA, RDMS, RVT, RDCS  Examination Guidelines: A complete evaluation includes B-mode imaging, spectral Doppler, color Doppler, and power Doppler as needed of all accessible portions of each vessel. Bilateral testing is  considered an integral part of a complete examination. Limited examinations for reoccurring indications may be performed as noted.  Right Findings: +----------+------------+---------+-----------+----------+-------+ RIGHT     CompressiblePhasicitySpontaneousPropertiesSummary +----------+------------+---------+-----------+----------+-------+ IJV           Full       Yes       Yes                      +----------+------------+---------+-----------+----------+-------+ Subclavian    Full       Yes       Yes                      +----------+------------+---------+-----------+----------+-------+ Axillary      Full       Yes       Yes                      +----------+------------+---------+-----------+----------+-------+ Brachial      Full       Yes       Yes                      +----------+------------+---------+-----------+----------+-------+ Radial        Full                                          +----------+------------+---------+-----------+----------+-------+ Cephalic      Full                                          +----------+------------+---------+-----------+----------+-------+ Basilic       Full                                          +----------+------------+---------+-----------+----------+-------+ Unable to  visualize right ulnar veins due to technical limitations  Left Findings: +----------+------------+---------+-----------+----------+-------+ LEFT      CompressiblePhasicitySpontaneousPropertiesSummary +----------+------------+---------+-----------+----------+-------+ IJV           Full       Yes       Yes                      +----------+------------+---------+-----------+----------+-------+ Subclavian    Full       Yes       Yes                      +----------+------------+---------+-----------+----------+-------+ Radial        Full                                           +----------+------------+---------+-----------+----------+-------+ Unable to visualize left axillary, brachial, basilic, cephalic, and ulnar veins due to technical limitations.  Summary:  Right: No evidence of deep vein thrombosis in the upper extremity. No evidence of superficial vein thrombosis in the upper extremity.  Left: No obvious evidence of deep vein thrombosis involving the visualized veins of the upper extremity.  *See table(s) above for measurements and observations.  Diagnosing physician: Harold Barban MD Electronically signed by Harold Barban MD on 04/29/2020 at 1:04:23 PM.    Final      Abdul Beirne T. Melvin  If 7PM-7AM, please contact night-coverage [email or web address] 04/30/2020, 11:46 AM

## 2020-04-30 NOTE — Progress Notes (Signed)
Pt. placed back on BiPAP V-60 due to periods of Apnea along with desaturations to mid/upper 80's while on 1 lpm n/c, chart reviewed with similar episodes noted with >'d PC02 with labs and wore  pt. has active prn order. Was previously placed on BiPAP 04/29/2020 at 1637, pt. wore this date approximately 20 minutes before removing and refused to have replaced, placed back to 1 lpm n/c, RN made aware, plan to leave at bedside if needed, pt. Is DNR/DNI, RT to monitor.

## 2020-04-30 NOTE — Progress Notes (Signed)
eLink Physician-Brief Progress Note Patient Name: Jay Holloway DOB: Oct 20, 1945 MRN: [id number]   Date of Service  04/30/2020  HPI/Events of Note  58M with history of prostate cancer, T2DM, HTN, and ? COPD who has had a recent hospitalization for hypercapnic and hypoxemic respiratory failure who presented with somnolence secondary to acute hypercapnic respiratory failure. He was treated with BiPAP and ABX (CTX/azithro) for possible COPD exacerbation vs pneumonia (Chest CT with LLL opacity c/f bronchopneumonia) and given IVF boluses. He was found to have a lower extremity DVT per report (noted in Palliative care note) and was started on heparin for this, although I do not immediately see the study that reports this finding. He was also noted to be mildly hypoglycemic at CBG of 68 so he was given sips of juice for this. No recheck done yet but ordered now. BiPAP has been off for several hours after a repeat ABG showed significant improvement. Patient is easily rousable. He is saturating 98% on 2L [place].   eICU Interventions  # Pulm: - Repeat VBG in AM - BiPAP PRN. Will closely monitor respiratory and mental status clinically. - Agree with CTX/Azithro empirically.  # Heme: - F/u which report confirms LE DVT, however echo does show moderately enlarged RV with moderate systolic dysfunction. -Continue heparin drip for now but this study reporting the finding of a LE DVT needs to be followed up.  # Endo: - Repeat CBG now. - D5 NS @ 75 cc/hr. D/c'd LR mIVF.     Intervention Category Evaluation Type: New Patient Evaluation  Jay Holloway 04/30/2020, 2:02 AM

## 2020-04-30 NOTE — ED Notes (Signed)
ED TO INPATIENT HANDOFF REPORT  Name/Age/Gender Jay Holloway 74 y.o. male  Code Status    Code Status Orders  (From admission, onward)         Start     Ordered   04/28/20 1938  Do not attempt resuscitation (DNR)  Continuous       Question Answer Comment  In the event of cardiac or respiratory ARREST Do not call a "code blue"   In the event of cardiac or respiratory ARREST Do not perform Intubation, CPR, defibrillation or ACLS   In the event of cardiac or respiratory ARREST Use medication by any route, position, wound care, and other measures to relive pain and suffering. May use oxygen, suction and manual treatment of airway obstruction as needed for comfort.      04/28/20 1937        Code Status History    Date Active Date Inactive Code Status Order ID Comments User Context   04/28/2020 1847 04/28/2020 1937 DNR [id number]  Vernelle Emerald, MD ED   04/14/2020 1839 04/19/2020 2255 DNR [id number]  Mckinley Jewel, MD ED   04/14/2020 1836 04/14/2020 1839 DNR [id number]  Mckinley Jewel, MD ED   03/21/2020 1559 03/25/2020 0542 Full Code [id number]  Karmen Bongo, MD ED   11/05/2014 1947 11/07/2014 1106 Full Code [id number]  Crecencio Mc, MD Inpatient   11/05/2014 1942 11/05/2014 1947 Full Code [id number]  Crecencio Mc, MD ED   Advance Care Planning Activity    Advance Directive Documentation     Most Recent Value  Type of Advance Directive Out of facility DNR (pink MOST or yellow form)  Pre-existing out of facility DNR order (yellow form or pink MOST form) Pink MOST/Yellow Form most recent copy in chart - Physician notified to receive inpatient order  "MOST" Form in Place? --      Home/SNF/Other Home  Chief Complaint Acute hypercapnic respiratory failure ([place]) [J96.02]  Level of Care/Admitting Diagnosis ED Disposition    ED Disposition Condition [place]: [facility] [[id number]]  Level of Care: Stepdown [14]  Admit to SDU based on following  criteria: Respiratory Distress:  Frequent assessment and/or intervention to maintain adequate ventilation/respiration, pulmonary toilet, and respiratory treatment.  Admit to SDU based on following criteria: Other see comments  Comments: severe hypercapnic encephalopathy  May admit patient to [facility] or [facility] if equivalent level of care is available:: No  Covid Evaluation: Confirmed COVID Negative  Diagnosis: Acute hypercapnic respiratory failure [facility]) [[id number]]  Admitting Physician: Vernelle Emerald [[id number]]  Attending Physician: Vernelle Emerald [[id number]]  Estimated length of stay: 5 - 7 days  Certification:: I certify this patient will need inpatient services for at least 2 midnights       Medical History Past Medical History:  Diagnosis Date  . Arthritis    trouble turning head side to side and up  . Diabetes mellitus without complication ([place])   . Hemorrhoids   . High cholesterol   . Hypertension   . Prostate cancer ([place])     Allergies No Known Allergies  IV Location/Drains/Wounds Patient Lines/Drains/Airways Status    Active Line/Drains/Airways    Name Placement date Placement time Site Days   Peripheral IV 04/28/20 Left;Upper Arm 04/28/20  1246  Arm  2   Peripheral IV 04/29/20 Right Forearm 04/29/20  1227  Forearm  1   External Urinary Catheter 03/22/20  2240  --  39   Wound /  Incision (Open or Dehisced) 04/16/20 Coccyx Mid unstageable pressure injury to sacrum 04/16/20  1400  Coccyx  14          Labs/Imaging Results for orders placed or performed during the hospital encounter of 04/28/20 (from the past 48 hour(s))  CBC with Differential/Platelet     Status: Abnormal   Collection Time: 04/28/20 12:15 PM  Result Value Ref Range   WBC 12.9 (H) 4.0 - 10.5 K/uL   RBC 4.71 4.22 - 5.81 MIL/uL   Hemoglobin 13.4 13.0 - 17.0 g/dL   HCT 38.3 39 - 52 %   MCV 93.6 80.0 - 100.0 fL   MCH 28.5 26.0 - 34.0 pg   MCHC 30.4 30.0 - 36.0 g/dL   RDW 65.4 27.1 -  56.6 %   Platelets 221 150 - 400 K/uL   nRBC 0.0 0.0 - 0.2 %   Neutrophils Relative % 64 %   Neutro Abs 8.3 (H) 1.7 - 7.7 K/uL   Lymphocytes Relative 30 %   Lymphs Abs 3.8 0.7 - 4.0 K/uL   Monocytes Relative 5 %   Monocytes Absolute 0.6 0 - 1 K/uL   Eosinophils Relative 0 %   Eosinophils Absolute 0.0 0 - 0 K/uL   Basophils Relative 0 %   Basophils Absolute 0.0 0 - 0 K/uL   Immature Granulocytes 1 %   Abs Immature Granulocytes 0.06 0.00 - 0.07 K/uL    Comment: Performed at [facility], [address], [place], [place] [address]  Comprehensive metabolic panel     Status: Abnormal   Collection Time: 04/28/20 12:15 PM  Result Value Ref Range   Sodium 137 135 - 145 mmol/L   Potassium 5.3 (H) 3.5 - 5.1 mmol/L   Chloride 88 (L) 98 - 111 mmol/L   CO2 37 (H) 22 - 32 mmol/L   Glucose, Bld 286 (H) 70 - 99 mg/dL    Comment: Glucose reference range applies only to samples taken after fasting for at least 8 hours.   BUN 23 8 - 23 mg/dL   Creatinine, Ser 2.20 (H) 0.61 - 1.24 mg/dL   Calcium 9.4 8.9 - 19.9 mg/dL   Total Protein 7.2 6.5 - 8.1 g/dL   Albumin 3.9 3.5 - 5.0 g/dL   AST 48 (H) 15 - 41 U/L   ALT 51 (H) 0 - 44 U/L   Alkaline Phosphatase 100 38 - 126 U/L   Total Bilirubin 0.9 0.3 - 1.2 mg/dL   GFR calc non Af Amer 32 (L) >60 mL/min   GFR calc Af Amer 37 (L) >60 mL/min   Anion gap 12 5 - 15    Comment: Performed at [facility], [address] [place], [place] [address]  Brain natriuretic peptide     Status: Abnormal   Collection Time: 04/28/20 12:15 PM  Result Value Ref Range   B Natriuretic Peptide 327.9 (H) 0.0 - 100.0 pg/mL    Comment: Performed at [facility], [address], [place], [place] [address]  Vitamin B12     Status: None   Collection Time: 04/28/20 12:15 PM  Result Value Ref Range   Vitamin B-12 626 180 - 914 pg/mL    Comment: (NOTE) This assay is not validated for testing neonatal  or myeloproliferative syndrome specimens for Vitamin B12 levels. Performed at [facility], [address], [place], [place] [address]   Folate     Status: None   Collection Time: 04/28/20  12:15 PM  Result Value Ref Range   Folate 13.5 >5.9 ng/mL    Comment: Performed at [facility], [place] [address] [place], [place] [address]  Blood culture (routine x 2)     Status: None (Preliminary result)   Collection Time: 04/28/20 12:30 PM   Specimen: BLOOD  Result Value Ref Range   Specimen Description      BLOOD RIGHT ARM UPPER Performed at [place] [address], [place], [place] [address]    Special Requests      BOTTLES DRAWN AEROBIC ONLY Blood Culture results may not be optimal due to an inadequate volume of blood received in culture bottles Performed at [place] [address], [place], [place] [address]    Culture      NO GROWTH < 24 HOURS Performed at [place] [address], [place], [place] [address]    Report Status PENDING   Lactic acid, plasma     Status: None   Collection Time: 04/28/20 12:35 PM  Result Value Ref Range   Lactic Acid, Venous 1.8 0.5 - 1.9 mmol/L    Comment: Performed at [facility], [place] [address], [place], [place] [address]  Ethanol     Status: None   Collection Time: 04/28/20 12:36 PM  Result Value Ref Range   Alcohol, Ethyl (B) <10 <10 mg/dL    Comment: (NOTE) Lowest detectable limit for serum alcohol is 10 mg/dL.  For medical purposes only. Performed at [facility], [place] [address], [place], [place] [address]   Ammonia     Status: None   Collection Time: 04/28/20 12:36 PM  Result Value Ref Range   Ammonia 12 9 - 35 umol/L    Comment: Performed at [facility] [place] [address], [place], [place] [address]  Magnesium     Status: Abnormal   Collection Time: 04/28/20 12:36 PM  Result Value Ref  Range   Magnesium 1.5 (L) 1.7 - 2.4 mg/dL    Comment: Performed at [facility], [place] [address] [place], [place] [address]  Blood gas, venous (at [facility], not at [facility])     Status: Abnormal   Collection Time: 04/28/20 12:40 PM  Result Value Ref Range   pH, Ven 7.176 (LL) 7.25 - 7.43    Comment: CRITICAL RESULT CALLED TO, READ BACK BY AND VERIFIED WITH: GRANT,K @ 1258 ON 100121 BY POTEAT,S    pCO2, Ven 113 (HH) 44 - 60 mmHg    Comment: CRITICAL RESULT CALLED TO, READ BACK BY AND VERIFIED WITH: GRANT,K @ 1258 ON 100121 BY POTEAT,S    pO2, Ven 42.4 32 - 45 mmHg   Bicarbonate 39.9 (H) 20.0 - 28.0 mmol/L   Acid-Base Excess 7.0 (H) 0.0 - 2.0 mmol/L   O2 Saturation 56.7 %   Patient temperature 98.6     Comment: Performed at [facility], [place] [address], [place], [place] [address]  Blood culture (routine x 2)     Status: None (Preliminary result)   Collection Time: 04/28/20 12:41 PM   Specimen: BLOOD  Result Value Ref Range   Specimen Description      BLOOD LEFT ARM UPPER Performed at [place] [address], [place], [place] [address]    Special Requests      BOTTLES DRAWN AEROBIC AND ANAEROBIC Blood Culture adequate volume Performed at [facility]  [place], [place] [address]    Culture      NO GROWTH < 24 HOURS Performed at [facility], [place] [address] [place], [place] [address]    Report Status PENDING   POC CBG, ED     Status: Abnormal   Collection Time: 04/28/20  1:20 PM  Result Value Ref Range   Glucose-Capillary 243 (H) 70 - 99 mg/dL    Comment: Glucose reference range applies only to samples taken after fasting for at least 8 hours.  Respiratory Panel by RT PCR (Flu A&B, Covid) - Nasopharyngeal Swab     Status: None   Collection Time: 04/28/20  1:21 PM   Specimen: Nasopharyngeal Swab  Result Value Ref Range   SARS Coronavirus 2 by RT PCR NEGATIVE NEGATIVE     Comment: (NOTE) SARS-CoV-2 target nucleic acids are NOT DETECTED.  The SARS-CoV-2 RNA is generally detectable in upper respiratoy specimens during the acute phase of infection. The lowest concentration of SARS-CoV-2 viral copies this assay can detect is 131 copies/mL. A negative result does not preclude SARS-Cov-2 infection and should not be used as the sole basis for treatment or other patient management decisions. A negative result may occur with  improper specimen collection/handling, submission of specimen other than nasopharyngeal swab, presence of viral mutation(s) within the areas targeted by this assay, and inadequate number of viral copies (<131 copies/mL). A negative result must be combined with clinical observations, patient history, and epidemiological information. The expected result is Negative.  Fact Sheet for Patients:  PinkCheek.be  Fact Sheet for Healthcare Providers:  GravelBags.it  This test is no t yet approved or cleared by the [place] FDA and  has been authorized for detection and/or diagnosis of SARS-CoV-2 by FDA under an Emergency Use Authorization (EUA). This EUA will remain  in effect (meaning this test can be used) for the duration of the COVID-19 declaration under Section 564(b)(1) of the Act, 21 U.S.C. section 360bbb-3(b)(1), unless the authorization is terminated or revoked sooner.     Influenza A by PCR NEGATIVE NEGATIVE   Influenza B by PCR NEGATIVE NEGATIVE    Comment: (NOTE) The Xpert Xpress SARS-CoV-2/FLU/RSV assay is intended as an aid in  the diagnosis of influenza from Nasopharyngeal swab specimens and  should not be used as a sole basis for treatment. Nasal washings and  aspirates are unacceptable for Xpert Xpress SARS-CoV-2/FLU/RSV  testing.  Fact Sheet for Patients: PinkCheek.be  Fact Sheet for Healthcare  Providers: GravelBags.it  This test is not yet approved or cleared by the [place] FDA and  has been authorized for detection and/or diagnosis of SARS-CoV-2 by  FDA under an Emergency Use Authorization (EUA). This EUA will remain  in effect (meaning this test can be used) for the duration of the  Covid-19 declaration under Section 564(b)(1) of the Act, 21  U.S.C. section 360bbb-3(b)(1), unless the authorization is  terminated or revoked. Performed at [facility], [place] [address], [place],  [address]   I-stat chem 8, ED (not at [facility] or [facility])     Status: Abnormal   Collection Time: 04/28/20  1:33 PM  Result Value Ref Range   Sodium 134 (L) 135 - 145 mmol/L   Potassium 5.3 (H) 3.5 - 5.1 mmol/L   Chloride 89 (L) 98 - 111 mmol/L   BUN 29 (H) 8 - 23 mg/dL   Creatinine, Ser 2.10 (H) 0.61 - 1.24 mg/dL   Glucose, Bld 273 (H) 70 - 99 mg/dL  Comment: Glucose reference range applies only to samples taken after fasting for at least 8 hours.   Calcium, Ion 1.17 1.15 - 1.40 mmol/L   TCO2 40 (H) 22 - 32 mmol/L   Hemoglobin 14.6 13.0 - 17.0 g/dL   HCT 43.0 39 - 52 %  Lactic acid, plasma     Status: None   Collection Time: 04/28/20  2:35 PM  Result Value Ref Range   Lactic Acid, Venous 1.4 0.5 - 1.9 mmol/L    Comment: Performed at [facility], [place] [address] [place], [place] [address]  POC SARS Coronavirus 2 Ag-ED - Nasal Swab (BD Veritor Kit)     Status: None   Collection Time: 04/28/20  2:56 PM  Result Value Ref Range   SARS Coronavirus 2 Ag NEGATIVE NEGATIVE    Comment: (NOTE) SARS-CoV-2 antigen NOT DETECTED.   Negative results are presumptive.  Negative results do not preclude SARS-CoV-2 infection and should not be used as the sole basis for treatment or other patient management decisions, including infection  control decisions, particularly in the presence of clinical signs and  symptoms consistent with  COVID-19, or in those who have been in contact with the virus.  Negative results must be combined with clinical observations, patient history, and epidemiological information. The expected result is Negative.  Fact Sheet for Patients: PodPark.tn  Fact Sheet for Healthcare Providers: GiftContent.is   This test is not yet approved or cleared by the [place] FDA and  has been authorized for detection and/or diagnosis of SARS-CoV-2 by FDA under an Emergency Use Authorization (EUA).  This EUA will remain in effect (meaning this test can be used) for the duration of  the C OVID-19 declaration under Section 564(b)(1) of the Act, 21 U.S.C. section 360bbb-3(b)(1), unless the authorization is terminated or revoked sooner.    Blood gas, arterial     Status: Abnormal   Collection Time: 04/28/20  3:13 PM  Result Value Ref Range   pH, Arterial 7.164 (LL) 7.35 - 7.45    Comment: CRITICAL RESULT CALLED TO, READ BACK BY AND VERIFIED WITH: [facility] RN $RemoveB'@1559'[email or web address]$  04/28/20 BILLINGSLEY,L    pCO2 arterial 117 (HH) 32 - 48 mmHg    Comment: CRITICAL RESULT CALLED TO, READ BACK BY AND VERIFIED WITH: [facility] RN $Remov'@1559'[email or web address]$  04/28/20 BILLINGSLEY,L    pO2, Arterial 102 83 - 108 mmHg   Bicarbonate 40.5 (H) 20.0 - 28.0 mmol/L   Acid-Base Excess 7.8 (H) 0.0 - 2.0 mmol/L   O2 Saturation 95.3 %   Patient temperature 98.6    Sample type ARTERIAL     Comment: Performed at [facility], [place] [address], [place], [place] [address]  Blood gas, arterial     Status: Abnormal   Collection Time: 04/28/20  6:50 PM  Result Value Ref Range   Delivery systems NASAL CANNULA    pH, Arterial 7.235 (L) 7.35 - 7.45   pCO2 arterial 96.4 (HH) 32 - 48 mmHg    Comment: CRITICAL RESULT CALLED TO, READ BACK BY AND VERIFIED WITH: FRANKLIN,C. RN $RemoveBefore'@1906'[id number]$  04/28/20 BILLINGSLEY,L    pO2, Arterial 77.8 (L) 83 - 108 mmHg   Bicarbonate 39.4 (H) 20.0 - 28.0 mmol/L    Acid-Base Excess 8.6 (H) 0.0 - 2.0 mmol/L   O2 Saturation 92.8 %   Patient temperature 98.6    Collection site RIGHT BRACHIAL    Drawn by [id number]    Sample type ARTERIAL     Comment: Performed at [facility], [address] [place], [place] [address]  Urinalysis, Complete w Microscopic Urine, Catheterized     Status: Abnormal   Collection Time: 04/28/20  8:16 PM  Result Value Ref Range   Color, Urine AMBER (A) YELLOW    Comment: BIOCHEMICALS MAY BE AFFECTED BY COLOR   APPearance CLOUDY (A) CLEAR   Specific Gravity, Urine 1.020 1.005 - 1.030   pH 5.0 5.0 - 8.0   Glucose, UA NEGATIVE NEGATIVE mg/dL   Hgb urine dipstick NEGATIVE NEGATIVE   Bilirubin Urine NEGATIVE NEGATIVE   Ketones, ur NEGATIVE NEGATIVE mg/dL   Protein, ur 557 (A) NEGATIVE mg/dL   Nitrite NEGATIVE NEGATIVE   Leukocytes,Ua LARGE (A) NEGATIVE   RBC / HPF 6-10 0 - 5 RBC/hpf   WBC, UA >50 (H) 0 - 5 WBC/hpf   Bacteria, UA MANY (A) NONE SEEN   Squamous Epithelial / LPF 0-5 0 - 5   WBC Clumps PRESENT    Mucus PRESENT    Hyaline Casts, UA PRESENT     Comment: Performed at [facility], [address], [place], [place] [address]  Urine rapid drug screen (hosp performed)     Status: Abnormal   Collection Time: 04/28/20  8:16 PM  Result Value Ref Range   Opiates POSITIVE (A) NONE DETECTED   Cocaine NONE DETECTED NONE DETECTED   Benzodiazepines NONE DETECTED NONE DETECTED   Amphetamines NONE DETECTED NONE DETECTED   Tetrahydrocannabinol NONE DETECTED NONE DETECTED   Barbiturates NONE DETECTED NONE DETECTED    Comment: (NOTE) DRUG SCREEN FOR MEDICAL PURPOSES ONLY.  IF CONFIRMATION IS NEEDED FOR ANY PURPOSE, NOTIFY LAB WITHIN 5 DAYS.  LOWEST DETECTABLE LIMITS FOR URINE DRUG SCREEN Drug Class                     Cutoff (ng/mL) Amphetamine and metabolites    1000 Barbiturate and metabolites    200 Benzodiazepine                 200 Tricyclics and metabolites     300 Opiates and  metabolites        300 Cocaine and metabolites        300 THC                            50 Performed at [facility], [address], [place], [place] [address]   CBG monitoring, ED     Status: Abnormal   Collection Time: 04/28/20  8:19 PM  Result Value Ref Range   Glucose-Capillary 248 (H) 70 - 99 mg/dL    Comment: Glucose reference range applies only to samples taken after fasting for at least 8 hours.  Renal function panel     Status: Abnormal   Collection Time: 04/28/20  9:34 PM  Result Value Ref Range   Sodium 138 135 - 145 mmol/L   Potassium 5.1 3.5 - 5.1 mmol/L   Chloride 90 (L) 98 - 111 mmol/L   CO2 38 (H) 22 - 32 mmol/L   Glucose, Bld 248 (H) 70 - 99 mg/dL    Comment: Glucose reference range applies only to samples taken after fasting for at least 8 hours.   BUN 26 (H) 8 - 23 mg/dL   Creatinine, Ser 7.78 (H) 0.61 - 1.24 mg/dL   Calcium 9.0 8.9 - 07.8 mg/dL   Phosphorus 7.1 (H) 2.5 - 4.6 mg/dL   Albumin 3.4 (L)  3.5 - 5.0 g/dL   GFR calc non Af Amer 30 (L) >60 mL/min   GFR calc Af Amer 35 (L) >60 mL/min   Anion gap 10 5 - 15    Comment: Performed at [facility], [place] [address] [place], [place] [address]  D-dimer, quantitative (not at [facility])     Status: Abnormal   Collection Time: 04/28/20  9:34 PM  Result Value Ref Range   D-Dimer, Quant 1.76 (H) 0.00 - 0.50 ug/mL-FEU    Comment: (NOTE) At the manufacturer cut-off of 0.50 ug/mL FEU, this assay has been documented to exclude PE with a sensitivity and negative predictive value of 97 to 99%.  At this time, this assay has not been approved by the FDA to exclude DVT/VTE. Results should be correlated with clinical presentation. Performed at [facility], [place] [address], [place], [place] [address]   Protime-INR     Status: None   Collection Time: 04/28/20  9:34 PM  Result Value Ref Range   Prothrombin Time 12.0 11.4 - 15.2 seconds   INR 0.9 0.8 - 1.2    Comment:  (NOTE) INR goal varies based on device and disease states. Performed at [facility], [place] [address], [place], [place] [address]   APTT     Status: None   Collection Time: 04/28/20  9:34 PM  Result Value Ref Range   aPTT 27 24 - 36 seconds    Comment: Performed at [facility], [place] [address], [place], [place] [address]  Troponin I (High Sensitivity)     Status: Abnormal   Collection Time: 04/28/20  9:34 PM  Result Value Ref Range   Troponin I (High Sensitivity) 61 (H) <18 ng/L    Comment: (NOTE) Elevated high sensitivity troponin I (hsTnI) values and significant  changes across serial measurements may suggest ACS but many other  chronic and acute conditions are known to elevate hsTnI results.  Refer to the "Links" section for chest pain algorithms and additional  guidance. Performed at [facility], [place] [address] [place], [place] [address]   CBG monitoring, ED     Status: Abnormal   Collection Time: 04/29/20  1:39 AM  Result Value Ref Range   Glucose-Capillary 200 (H) 70 - 99 mg/dL    Comment: Glucose reference range applies only to samples taken after fasting for at least 8 hours.  CBG monitoring, ED     Status: Abnormal   Collection Time: 04/29/20  4:17 AM  Result Value Ref Range   Glucose-Capillary 162 (H) 70 - 99 mg/dL    Comment: Glucose reference range applies only to samples taken after fasting for at least 8 hours.  CBC WITH DIFFERENTIAL     Status: Abnormal   Collection Time: 04/29/20  5:05 AM  Result Value Ref Range   WBC 13.2 (H) 4.0 - 10.5 K/uL   RBC 3.99 (L) 4.22 - 5.81 MIL/uL   Hemoglobin 11.5 (L) 13.0 - 17.0 g/dL   HCT 38.9 (L) 39 - 52 %   MCV 97.5 80.0 - 100.0 fL   MCH 28.8 26.0 - 34.0 pg   MCHC 29.6 (L) 30.0 - 36.0 g/dL   RDW 15.0 11.5 - 15.5 %   Platelets 152 150 - 400 K/uL   nRBC 0.0 0.0 - 0.2 %   Neutrophils Relative % 79 %   Neutro Abs 10.4 (H) 1.7 - 7.7 K/uL   Lymphocytes Relative 18 %  Lymphs Abs 2.4 0.7 - 4.0 K/uL   Monocytes Relative 2 %   Monocytes Absolute 0.3 0 - 1 K/uL   Eosinophils Relative 0 %   Eosinophils Absolute 0.0 0 - 0 K/uL   Basophils Relative 0 %   Basophils Absolute 0.0 0 - 0 K/uL   Immature Granulocytes 1 %   Abs Immature Granulocytes 0.07 0.00 - 0.07 K/uL    Comment: Performed at [facility], [place] [address] [place], [place] [address]  Blood gas, arterial     Status: Abnormal   Collection Time: 04/29/20  8:00 AM  Result Value Ref Range   FIO2 40.00    Delivery systems BILEVEL POSITIVE AIRWAY PRESSURE    Mode BILEVEL POSITIVE AIRWAY PRESSURE    pH, Arterial 7.195 (LL) 7.35 - 7.45    Comment: CRITICAL RESULT CALLED TO, READ BACK BY AND VERIFIED WITH: B.SAVOYE,RN [id number] @0913  BY V.WILKINS    pCO2 arterial 99.9 (HH) 32 - 48 mmHg    Comment: CRITICAL RESULT CALLED TO, READ BACK BY AND VERIFIED WITH: B.SAVOYE,RN [id number] @0913  BY V.WILKINS    pO2, Arterial 81.0 (L) 83 - 108 mmHg   Bicarbonate 37.1 (H) 20.0 - 28.0 mmol/L   Acid-Base Excess 6.0 (H) 0.0 - 2.0 mmol/L   O2 Saturation 94.3 %   Patient temperature 98.6     Comment: Performed at [facility], [place] [address], [place], [place] [address]  CBG monitoring, ED     Status: Abnormal   Collection Time: 04/29/20  8:19 AM  Result Value Ref Range   Glucose-Capillary 118 (H) 70 - 99 mg/dL    Comment: Glucose reference range applies only to samples taken after fasting for at least 8 hours.  Comprehensive metabolic panel     Status: Abnormal   Collection Time: 04/29/20  9:10 AM  Result Value Ref Range   Sodium 139 135 - 145 mmol/L   Potassium 5.0 3.5 - 5.1 mmol/L   Chloride 93 (L) 98 - 111 mmol/L   CO2 37 (H) 22 - 32 mmol/L   Glucose, Bld 100 (H) 70 - 99 mg/dL    Comment: Glucose reference range applies only to samples taken after fasting for at least 8 hours.   BUN 27 (H) 8 - 23 mg/dL   Creatinine, Ser 1.73 (H) 0.61 - 1.24 mg/dL   Calcium 8.6 (L) 8.9 - 10.3  mg/dL   Total Protein 6.1 (L) 6.5 - 8.1 g/dL   Albumin 3.1 (L) 3.5 - 5.0 g/dL   AST 47 (H) 15 - 41 U/L   ALT 45 (H) 0 - 44 U/L   Alkaline Phosphatase 83 38 - 126 U/L   Total Bilirubin 0.7 0.3 - 1.2 mg/dL   GFR calc non Af Amer 38 (L) >60 mL/min   GFR calc Af Amer 44 (L) >60 mL/min   Anion gap 9 5 - 15    Comment: Performed at [facility], [place] [address], [place], [place] [address]  Magnesium     Status: None   Collection Time: 04/29/20  9:10 AM  Result Value Ref Range   Magnesium 1.7 1.7 - 2.4 mg/dL    Comment: Performed at [facility], [place] [address], [place],  [address]  CBG monitoring, ED     Status: None   Collection Time: 04/29/20 11:53 AM  Result Value Ref Range   Glucose-Capillary 93 70 - 99 mg/dL    Comment: Glucose reference range applies only to  samples taken after fasting for at least 8 hours.  Blood gas, arterial     Status: Abnormal   Collection Time: 04/29/20  1:54 PM  Result Value Ref Range   FIO2 40.00    pH, Arterial 7.318 (L) 7.35 - 7.45   pCO2 arterial 75.6 (HH) 32 - 48 mmHg    Comment: CRITICAL RESULT CALLED TO, READ BACK BY AND VERIFIED WITH: CACCAVALE,S PA $RemoveBefore'@[id number]'[id number]$  ON 04/29/20 JACKSON,K    pO2, Arterial 44.8 (L) 83 - 108 mmHg   Bicarbonate 37.6 (H) 20.0 - 28.0 mmol/L   Acid-Base Excess 8.7 (H) 0.0 - 2.0 mmol/L   O2 Saturation 74.6 %   Patient temperature 98.6    Allens test (pass/fail) PASS PASS    Comment: Performed at [facility], [place] [address], [place], [place] [address]  CBG monitoring, ED     Status: Abnormal   Collection Time: 04/29/20  4:47 PM  Result Value Ref Range   Glucose-Capillary 101 (H) 70 - 99 mg/dL    Comment: Glucose reference range applies only to samples taken after fasting for at least 8 hours.  CBG monitoring, ED     Status: None   Collection Time: 04/29/20  9:27 PM  Result Value Ref Range   Glucose-Capillary 75 70 - 99 mg/dL    Comment: Glucose reference range  applies only to samples taken after fasting for at least 8 hours.  Heparin level (unfractionated)     Status: Abnormal   Collection Time: 04/29/20  9:35 PM  Result Value Ref Range   Heparin Unfractionated <0.10 (L) 0.30 - 0.70 IU/mL    Comment: (NOTE) If heparin results are below expected values, and patient dosage has  been confirmed, suggest follow up testing of antithrombin III levels. Performed at [facility], [place] [address] [place], [place] [address]   CBG monitoring, ED     Status: Abnormal   Collection Time: 04/30/20 12:05 AM  Result Value Ref Range   Glucose-Capillary 68 (L) 70 - 99 mg/dL    Comment: Glucose reference range applies only to samples taken after fasting for at least 8 hours.   CT ABDOMEN PELVIS WO CONTRAST  Result Date: 04/28/2020 CLINICAL DATA:  74 year old male with weakness, lethargy. "Malignancy workup" EXAM: CT CHEST, ABDOMEN AND PELVIS WITHOUT CONTRAST TECHNIQUE: Multidetector CT imaging of the chest, abdomen and pelvis was performed following the standard protocol without IV contrast. COMPARISON:  Head CT earlier today. Portable chest earlier today. CT Abdomen and Pelvis 12/24/2019 and earlier. FINDINGS: CT CHEST FINDINGS Cardiovascular: No cardiomegaly or pericardial effusion. Vascular patency is not evaluated in the absence of IV contrast. Mediastinum/Nodes: No lymphadenopathy is evident in the absence of contrast. Lungs/Pleura: Chronic elevation of the right hemidiaphragm appears stable since 2015. There is multifocal nodular and confluent peribronchial opacity in the left lower lobe with trace air bronchograms, new since 12/24/2019. No pleural effusion. Major airways are patent.  Upper lobes and middle lobes are clear. Musculoskeletal: Spinal ankylosis. Degenerative changes at the visible shoulders. No acute or suspicious osseous lesion. CT ABDOMEN PELVIS FINDINGS Hepatobiliary: Surgically absent gallbladder as before. Negative noncontrast  liver. Pancreas: Negative noncontrast pancreas. Spleen: Negative. Adrenals/Urinary Tract: Adrenal glands appear stable and within normal limits. Chronic exophytic right lower pole renal cyst with simple fluid density. Chronic smaller left midpole renal cyst with simple fluid density. No hydronephrosis. 4 mm left renal lower pole calculus. No hydroureter. Unremarkable urinary bladder. Numerous pelvic phleboliths. Stomach/Bowel: Fluid in nondistended rectum. Redundant sigmoid  colon mostly containing gas. Diverticulosis of the descending colon which is decompressed. Mild stool in the transverse colon which is partially decompressed. Mildly redundant right colon containing gas and stool. Normal appendix on series 2, image 81. No large bowel inflammation. No dilated small bowel. Moderately air in fluid distended stomach, but the gastric antrum and proximal duodenum appear unremarkable. Decompressed duodenum. No free air. No free fluid. Vascular/Lymphatic: Normal caliber aorta. Mild calcified plaque. Vascular patency is not evaluated in the absence of IV contrast. No lymphadenopathy is evident. Reproductive: Negative. Other: Mild dependent body wall edema. Trace presacral stranding or fluid. Musculoskeletal: Chronic lumbar spine ankylosis. But the SI joints remain relatively patent. Advanced degenerative changes at both hips. No acute or suspicious osseous lesion identified. IMPRESSION: 1. Left lower lobe opacity suspicious for Bronchopneumonia. Consider also aspiration. No pleural effusion. 2. Otherwise no acute or malignant process identified in the noncontrast chest, abdomen, or pelvis. 3. Chronic spinal ankylosis. Left nephrolithiasis. Descending colon diverticulosis. Electronically Signed   By: Genevie Ann M.D.   On: 04/28/2020 21:04   CT Head Wo Contrast  Result Date: 04/28/2020 CLINICAL DATA:  Altered mental status. EXAM: CT HEAD WITHOUT CONTRAST TECHNIQUE: Contiguous axial images were obtained from the base of the  skull through the vertex without intravenous contrast. COMPARISON:  CT head 04/14/2020 FINDINGS: Brain: No evidence of acute infarction, hemorrhage, hydrocephalus, extra-axial collection or mass lesion/mass effect. Mild atrophy. Mild white matter changes consistent with chronic microvascular ischemia. Vascular: Negative for hyperdense vessel Skull: Negative Sinuses/Orbits: Paranasal sinuses clear.  Negative orbit. Other: None IMPRESSION: No acute abnormality. Electronically Signed   By: Franchot Gallo M.D.   On: 04/28/2020 13:18   CT CHEST WO CONTRAST  Result Date: 04/28/2020 CLINICAL DATA:  74 year old male with weakness, lethargy. "Malignancy workup" EXAM: CT CHEST, ABDOMEN AND PELVIS WITHOUT CONTRAST TECHNIQUE: Multidetector CT imaging of the chest, abdomen and pelvis was performed following the standard protocol without IV contrast. COMPARISON:  Head CT earlier today. Portable chest earlier today. CT Abdomen and Pelvis 12/24/2019 and earlier. FINDINGS: CT CHEST FINDINGS Cardiovascular: No cardiomegaly or pericardial effusion. Vascular patency is not evaluated in the absence of IV contrast. Mediastinum/Nodes: No lymphadenopathy is evident in the absence of contrast. Lungs/Pleura: Chronic elevation of the right hemidiaphragm appears stable since 2015. There is multifocal nodular and confluent peribronchial opacity in the left lower lobe with trace air bronchograms, new since 12/24/2019. No pleural effusion. Major airways are patent.  Upper lobes and middle lobes are clear. Musculoskeletal: Spinal ankylosis. Degenerative changes at the visible shoulders. No acute or suspicious osseous lesion. CT ABDOMEN PELVIS FINDINGS Hepatobiliary: Surgically absent gallbladder as before. Negative noncontrast liver. Pancreas: Negative noncontrast pancreas. Spleen: Negative. Adrenals/Urinary Tract: Adrenal glands appear stable and within normal limits. Chronic exophytic right lower pole renal cyst with simple fluid density.  Chronic smaller left midpole renal cyst with simple fluid density. No hydronephrosis. 4 mm left renal lower pole calculus. No hydroureter. Unremarkable urinary bladder. Numerous pelvic phleboliths. Stomach/Bowel: Fluid in nondistended rectum. Redundant sigmoid colon mostly containing gas. Diverticulosis of the descending colon which is decompressed. Mild stool in the transverse colon which is partially decompressed. Mildly redundant right colon containing gas and stool. Normal appendix on series 2, image 81. No large bowel inflammation. No dilated small bowel. Moderately air in fluid distended stomach, but the gastric antrum and proximal duodenum appear unremarkable. Decompressed duodenum. No free air. No free fluid. Vascular/Lymphatic: Normal caliber aorta. Mild calcified plaque. Vascular patency is not evaluated in the absence  of IV contrast. No lymphadenopathy is evident. Reproductive: Negative. Other: Mild dependent body wall edema. Trace presacral stranding or fluid. Musculoskeletal: Chronic lumbar spine ankylosis. But the SI joints remain relatively patent. Advanced degenerative changes at both hips. No acute or suspicious osseous lesion identified. IMPRESSION: 1. Left lower lobe opacity suspicious for Bronchopneumonia. Consider also aspiration. No pleural effusion. 2. Otherwise no acute or malignant process identified in the noncontrast chest, abdomen, or pelvis. 3. Chronic spinal ankylosis. Left nephrolithiasis. Descending colon diverticulosis. Electronically Signed   By: Genevie Ann M.D.   On: 04/28/2020 21:04   VAS [place] IVC/ILIAC (VENOUS ONLY)  Result Date: 04/29/2020 IVC/ILIAC STUDY Indications: Right common femoral vein mobile DVT Other Factors: Hypoxia, elevated d-dimer. Limitations: Air/bowel gas, patient discomfort, patient position, and poor patient cooperation.  Comparison Study: No prior study Performing Technologist: Maudry Mayhew MHA, RDMS, RVT, RDCS  Examination Guidelines: A complete  evaluation includes B-mode imaging, spectral Doppler, color Doppler, and power Doppler as needed of all accessible portions of each vessel. Bilateral testing is considered an integral part of a complete examination. Limited examinations for reoccurring indications may be performed as noted.  +----------------+---------+-----------+---------+-----------+--------+       CIV       RT-PatentRT-ThrombusLT-PatentLT-ThrombusComments +----------------+---------+-----------+---------+-----------+--------+ Common Iliac Mid patent              patent                      +----------------+---------+-----------+---------+-----------+--------+  +-----------------------+---------+-----------+---------+-----------+--------+           EIV          RT-PatentRT-ThrombusLT-PatentLT-ThrombusComments +-----------------------+---------+-----------+---------+-----------+--------+ External Iliac Vein Mid patent              patent                      +-----------------------+---------+-----------+---------+-----------+--------+   Summary: IVC/Iliac: No obvious evidence of occlusive thrombus in bilateral iliac veins. Unable to visualize IVC due to technical limitations.  *See table(s) above for measurements and observations.  Electronically signed by Harold Barban MD on 04/29/2020 at 1:05:29 PM.   Final    DG Chest Portable 1 View  Result Date: 04/28/2020 CLINICAL DATA:  Hypoxia EXAM: PORTABLE CHEST 1 VIEW COMPARISON:  04/14/2020 FINDINGS: The heart size and mediastinal contours are within normal limits. Atherosclerotic calcification of the aortic knob. Unchanged mild elevation of the right hemidiaphragm with minimal right basilar atelectasis. Lungs appear otherwise clear. No pleural effusion or pneumothorax. The visualized skeletal structures are unremarkable. IMPRESSION: No active disease. Electronically Signed   By: Davina Poke D.O.   On: 04/28/2020 13:09   ECHOCARDIOGRAM COMPLETE  Result Date:  04/29/2020    ECHOCARDIOGRAM REPORT   Patient Name:   JAVARION DOUTY Date of Exam: 04/29/2020 Medical Rec #:  [id number]     Height:       72.0 in Accession #:    [id number]    Weight:       212.5 lb Date of Birth:  1945/11/30      BSA:          2.186 m Patient Age:    6 years      BP:           145/73 mmHg Patient Gender: M             HR:           90 bpm. Exam Location:  Inpatient Procedure: 2D Echo, Cardiac Doppler, Color Doppler and Intracardiac  Opacification Agent Indications:    CHF-Acute Systolic 826.41 / R83.09  History:        Patient has no prior history of Echocardiogram examinations.                 Risk Factors:Hypertension, Diabetes, Dyslipidemia and Former                 Smoker.  Sonographer:    Vickie Epley RDCS Referring Phys: [id number] [place]  Sonographer Comments: No subcostal window. Attempted definity but it did not work. IMPRESSIONS  1. Left ventricular ejection fraction, by estimation, is 60 to 65%. The left ventricle has normal function. The left ventricle has no regional wall motion abnormalities. Left ventricular diastolic parameters are indeterminate.  2. RV is not well visualized. Grossly appears moderately enlarged, mild to moderate systolic dysfunction. . Right ventricular systolic function was not well visualized. The right ventricular size is not well visualized.  3. The mitral valve is normal in structure. No evidence of mitral valve regurgitation. No evidence of mitral stenosis.  4. The aortic valve is tricuspid. There is mild calcification of the aortic valve. There is mild thickening of the aortic valve. Aortic valve regurgitation is not visualized. No aortic stenosis is present. FINDINGS  Left Ventricle: LVOT VTI appears inaccurate based on angle of acquisition. Left ventricular ejection fraction, by estimation, is 60 to 65%. The left ventricle has normal function. The left ventricle has no regional wall motion abnormalities. The left ventricular internal cavity  size was normal in size. There is no left ventricular hypertrophy. Left ventricular diastolic parameters are indeterminate. Right Ventricle: RV is not well visualized. Grossly appears moderately enlarged, mild to moderate systolic dysfunction. The right ventricular size is not well visualized. Right vetricular wall thickness was not well visualized. Right ventricular systolic  function was not well visualized. Left Atrium: Left atrial size was normal in size. Right Atrium: Right atrial size was not well visualized. Pericardium: There is no evidence of pericardial effusion. Mitral Valve: The mitral valve is normal in structure. No evidence of mitral valve regurgitation. No evidence of mitral valve stenosis. Tricuspid Valve: The tricuspid valve is normal in structure. Tricuspid valve regurgitation is not demonstrated. No evidence of tricuspid stenosis. Aortic Valve: The aortic valve is tricuspid. There is mild calcification of the aortic valve. There is mild thickening of the aortic valve. There is mild aortic valve annular calcification. Aortic valve regurgitation is not visualized. No aortic stenosis  is present. Aortic valve mean gradient measures 1.4 mmHg. Aortic valve peak gradient measures 3.2 mmHg. Aortic valve area, by VTI measures 3.29 cm. Pulmonic Valve: The pulmonic valve was not well visualized. Pulmonic valve regurgitation is not visualized. No evidence of pulmonic stenosis. Aorta: The aortic root is normal in size and structure. Pulmonary Artery: Indeterminate PASP, inadequate TR jet. Venous: The inferior vena cava was not well visualized. IAS/Shunts: The interatrial septum was not well visualized.  LEFT VENTRICLE PLAX 2D LVIDd:         4.50 cm     Diastology LVIDs:         3.30 cm     LV e' medial:    8.05 cm/s LV PW:         0.70 cm     LV E/e' medial:  6.3 LV IVS:        0.70 cm     LV e' lateral:   9.14 cm/s LVOT diam:     2.30 cm  LV E/e' lateral: 5.5 LV SV:         42 LV SV Index:   19 LVOT  Area:     4.15 cm  LV Volumes (MOD) LV vol d, MOD A4C: 74.7 ml LV vol s, MOD A4C: 33.8 ml LV SV MOD A4C:     74.7 ml RIGHT VENTRICLE RV S prime:     9.03 cm/s TAPSE (M-mode): 1.6 cm LEFT ATRIUM             Index       RIGHT ATRIUM           Index LA diam:        3.50 cm 1.60 cm/m  RA Area:     10.90 cm LA Vol (A2C):   18.7 ml 8.55 ml/m  RA Volume:   25.60 ml  11.71 ml/m LA Vol (A4C):   25.9 ml 11.85 ml/m LA Biplane Vol: 24.2 ml 11.07 ml/m  AORTIC VALVE AV Area (Vmax):    2.92 cm AV Area (Vmean):   3.43 cm AV Area (VTI):     3.29 cm AV Vmax:           89.29 cm/s AV Vmean:          54.930 cm/s AV VTI:            0.126 m AV Peak Grad:      3.2 mmHg AV Mean Grad:      1.4 mmHg LVOT Vmax:         62.70 cm/s LVOT Vmean:        45.300 cm/s LVOT VTI:          0.100 m LVOT/AV VTI ratio: 0.79  AORTA Ao Root diam: 3.10 cm MITRAL VALVE               TRICUSPID VALVE MV Area (PHT): 7.37 cm    TR Peak grad:   12.8 mmHg MV Decel Time: 103 msec    TR Vmax:        179.00 cm/s MV E velocity: 50.60 cm/s MV A velocity: 55.70 cm/s  SHUNTS MV E/A ratio:  0.91        Systemic VTI:  0.10 m                            Systemic Diam: 2.30 cm Carlyle Dolly MD Electronically signed by Carlyle Dolly MD Signature Date/Time: 04/29/2020/11:49:16 AM    Final    VAS [place] LOWER EXTREMITY VENOUS (DVT)  Result Date: 04/29/2020  Lower Venous DVTStudy Indications: Hypoxia, elevated d-dimer.  Limitations: Body habitus, poor patient cooperation/position, poor ultrasound/tissue interface. Comparison Study: No prior study Performing Technologist: Maudry Mayhew MHA, RDMS, RVT, RDCS  Examination Guidelines: A complete evaluation includes B-mode imaging, spectral Doppler, color Doppler, and power Doppler as needed of all accessible portions of each vessel. Bilateral testing is considered an integral part of a complete examination. Limited examinations for reoccurring indications may be performed as noted. The reflux portion of the exam is  performed with the patient in reverse Trendelenburg.  +---------+---------------+---------+-----------+----------+--------------+ RIGHT    CompressibilityPhasicitySpontaneityPropertiesThrombus Aging +---------+---------------+---------+-----------+----------+--------------+ CFV      None           Yes      Yes                  Acute mobile   +---------+---------------+---------+-----------+----------+--------------+ SFJ      Full                                                        +---------+---------------+---------+-----------+----------+--------------+  FV Prox                          Yes                                 +---------+---------------+---------+-----------+----------+--------------+ FV Mid                           Yes                                 +---------+---------------+---------+-----------+----------+--------------+ FV Distal                        Yes                                 +---------+---------------+---------+-----------+----------+--------------+ PFV                              No                   Acute          +---------+---------------+---------+-----------+----------+--------------+ POP                     Yes      Yes                                 +---------+---------------+---------+-----------+----------+--------------+ PTV                              No                   Acute          +---------+---------------+---------+-----------+----------+--------------+   Right Technical Findings: Not visualized segments include peroneal veins.  +---------+---------------+---------+-----------+----------+--------------+ LEFT     CompressibilityPhasicitySpontaneityPropertiesThrombus Aging +---------+---------------+---------+-----------+----------+--------------+ CFV      Full           Yes      Yes                                  +---------+---------------+---------+-----------+----------+--------------+ SFJ      Full                                                        +---------+---------------+---------+-----------+----------+--------------+ FV Prox  Full                                                        +---------+---------------+---------+-----------+----------+--------------+ FV Mid   Full                                                        +---------+---------------+---------+-----------+----------+--------------+  FV DistalFull                                                        +---------+---------------+---------+-----------+----------+--------------+ PFV      Full                                                        +---------+---------------+---------+-----------+----------+--------------+ POP      Full           Yes      Yes                                 +---------+---------------+---------+-----------+----------+--------------+   Left Technical Findings: Not visualized segments include PTV, peroneal veins.   Summary: RIGHT: - Findings consistent with acute deep vein thrombosis involving the right common femoral vein, and right posterior tibial vein. Thrombus in right common femoral vein is mobile. - No cystic structure found in the popliteal fossa.  LEFT: - There is no evidence of deep vein thrombosis in the lower extremity. However, portions of this examination were limited- see technologist comments above.  - No cystic structure found in the popliteal fossa.  *See table(s) above for measurements and observations. Electronically signed by Harold Barban MD on 04/29/2020 at 1:04:02 PM.    Final    VAS [place] UPPER EXTREMITY VENOUS DUPLEX  Result Date: 04/29/2020 UPPER VENOUS STUDY  Indications: Edema, and Hypoxia, elevated d-dimer Limitations: Body habitus, poor ultrasound/tissue interface, patient position/contracture, poor patient cooperation. Comparison Study: No prior  study Performing Technologist: Maudry Mayhew MHA, RDMS, RVT, RDCS  Examination Guidelines: A complete evaluation includes B-mode imaging, spectral Doppler, color Doppler, and power Doppler as needed of all accessible portions of each vessel. Bilateral testing is considered an integral part of a complete examination. Limited examinations for reoccurring indications may be performed as noted.  Right Findings: +----------+------------+---------+-----------+----------+-------+ RIGHT     CompressiblePhasicitySpontaneousPropertiesSummary +----------+------------+---------+-----------+----------+-------+ IJV           Full       Yes       Yes                      +----------+------------+---------+-----------+----------+-------+ Subclavian    Full       Yes       Yes                      +----------+------------+---------+-----------+----------+-------+ Axillary      Full       Yes       Yes                      +----------+------------+---------+-----------+----------+-------+ Brachial      Full       Yes       Yes                      +----------+------------+---------+-----------+----------+-------+ Radial        Full                                          +----------+------------+---------+-----------+----------+-------+  Cephalic      Full                                          +----------+------------+---------+-----------+----------+-------+ Basilic       Full                                          +----------+------------+---------+-----------+----------+-------+ Unable to visualize right ulnar veins due to technical limitations  Left Findings: +----------+------------+---------+-----------+----------+-------+ LEFT      CompressiblePhasicitySpontaneousPropertiesSummary +----------+------------+---------+-----------+----------+-------+ IJV           Full       Yes       Yes                       +----------+------------+---------+-----------+----------+-------+ Subclavian    Full       Yes       Yes                      +----------+------------+---------+-----------+----------+-------+ Radial        Full                                          +----------+------------+---------+-----------+----------+-------+ Unable to visualize left axillary, brachial, basilic, cephalic, and ulnar veins due to technical limitations.  Summary:  Right: No evidence of deep vein thrombosis in the upper extremity. No evidence of superficial vein thrombosis in the upper extremity.  Left: No obvious evidence of deep vein thrombosis involving the visualized veins of the upper extremity.  *See table(s) above for measurements and observations.  Diagnosing physician: Harold Barban MD Electronically signed by Harold Barban MD on 04/29/2020 at 1:04:23 PM.    Final     Pending Labs Unresulted Labs (From admission, onward)          Start     Ordered   04/30/20 0800  Heparin level (unfractionated)  Once-Timed,   STAT        04/29/20 2351   04/30/20 0500  CBC  Daily,   R      04/29/20 1241   04/30/20 0500  Renal function panel  Tomorrow morning,   R        04/29/20 1707   04/30/20 0500  Magnesium  Tomorrow morning,   R        04/29/20 1707   04/29/20 1630  Comprehensive metabolic panel  Once,   STAT        04/29/20 1629   04/28/20 1849  Culture, Urine  ONCE - STAT,   STAT        04/28/20 1848          Vitals/Pain Today's Vitals   04/29/20 2230 04/29/20 2245 04/29/20 2330 04/30/20 0000  BP: $Re'99/61 90/62 93/62 'WfA$ 92/69  Pulse: 100 (!) 103 100 (!) 103  Resp: (!) 24 (!) 25 (!) 42 (!) 21  Temp:      TempSrc:      SpO2: 100% 100% 100% 100%  Weight:      Height:      PainSc:        Isolation Precautions No active isolations  Medications Medications  lactated ringers bolus 1,000  mL (0 mLs Intravenous Stopped 04/29/20 0214)    Followed by  lactated ringers infusion ( Intravenous Stopped 04/29/20  1247)  pantoprazole (PROTONIX) injection 40 mg (40 mg Intravenous Given 04/29/20 2128)  ondansetron (ZOFRAN) tablet 4 mg (has no administration in time range)    Or  ondansetron (ZOFRAN) injection 4 mg (has no administration in time range)  acetaminophen (TYLENOL) tablet 650 mg (has no administration in time range)    Or  acetaminophen (TYLENOL) suppository 650 mg (has no administration in time range)  albuterol (PROVENTIL) (2.5 MG/3ML) 0.083% nebulizer solution 2.5 mg (has no administration in time range)  hydrALAZINE (APRESOLINE) injection 10 mg (has no administration in time range)  cefTRIAXone (ROCEPHIN) 1 g in sodium chloride 0.9 % 100 mL IVPB (0 g Intravenous Stopped 04/29/20 2318)  azithromycin (ZITHROMAX) 500 mg in sodium chloride 0.9 % 250 mL IVPB (0 mg Intravenous Stopped 04/29/20 1117)  ipratropium-albuterol (DUONEB) 0.5-2.5 (3) MG/3ML nebulizer solution 3 mL (3 mLs Nebulization Given 04/29/20 1946)  perflutren lipid microspheres (DEFINITY) IV suspension (4 mLs Intravenous Given 04/29/20 1137)  insulin aspart (novoLOG) injection 0-9 Units (0 Units Subcutaneous Not Given 04/29/20 1717)  insulin aspart (novoLOG) injection 0-5 Units (0 Units Subcutaneous Not Given 04/29/20 2137)  lactated ringers infusion (0 mLs Intravenous Paused 04/30/20 0002)  heparin ADULT infusion 100 units/mL (25000 units/246mL sodium chloride 0.45%) (1,850 Units/hr Intravenous Rate/Dose Change 04/30/20 0003)  methylPREDNISolone sodium succinate (SOLU-MEDROL) 125 mg/2 mL injection 125 mg (125 mg Intravenous Given 04/28/20 1506)  ipratropium-albuterol (DUONEB) 0.5-2.5 (3) MG/3ML nebulizer solution 3 mL (3 mLs Nebulization Given 04/28/20 1521)  ipratropium-albuterol (DUONEB) 0.5-2.5 (3) MG/3ML nebulizer solution (3 mLs  Given 04/28/20 1521)  magnesium sulfate IVPB 2 g 50 mL (0 g Intravenous Stopped 04/28/20 2150)  naloxone (NARCAN) injection 0.4 mg (0.4 mg Intravenous Given 04/28/20 2011)  lactated ringers bolus 1,000 mL (0 mLs  Intravenous Stopped 04/29/20 0214)  heparin bolus via infusion 2,500 Units (2,500 Units Intravenous Bolus from Bag 04/29/20 1359)  heparin bolus via infusion 2,000 Units (2,000 Units Intravenous Bolus from Bag 04/30/20 0003)    Mobility non-ambulatory

## 2020-04-30 NOTE — Progress Notes (Signed)
ANTICOAGULATION CONSULT NOTE  Pharmacy Consult for Heparin drip Indication: mobile left femoral thrombus  No Known Allergies  Patient Measurements: Height: 6' (182.9 cm) Weight: 96.4 kg (212 lb 8.4 oz) IBW/kg (Calculated) : 77.6  Vital Signs: Temp: 98.4 F (36.9 C) (10/03 0833) Temp Source: Oral (10/03 0833) BP: 115/48 (10/03 0400) Pulse Rate: 102 (10/03 0400)  Labs: Recent Labs    04/28/20 1215 04/28/20 1215 04/28/20 1333 04/28/20 1333 04/28/20 2134 04/29/20 0505 04/29/20 0910 04/29/20 2135 04/30/20 0226 04/30/20 0832  HGB 13.4   < > 14.6   < >  --  11.5*  --   --  10.6*  --   HCT 44.1   < > 43.0  --   --  38.9*  --   --  33.7*  --   PLT 221  --   --   --   --  152  --   --  126*  --   APTT  --   --   --   --  27  --   --   --   --   --   LABPROT  --   --   --   --  12.0  --   --   --   --   --   INR  --   --   --   --  0.9  --   --   --   --   --   HEPARINUNFRC  --   --   --   --   --   --   --  <0.10*  --  1.10*  CREATININE 1.99*   < > 2.10*   < > 2.12*  --  1.73*  --  1.45*  1.44*  --   TROPONINIHS  --   --   --   --  61*  --   --   --   --   --    < > = values in this interval not displayed.    Estimated Creatinine Clearance: 55 mL/min (A) (by C-G formula based on SCr of 1.44 mg/dL (H)).   Medical History: Past Medical History:  Diagnosis Date  . Arthritis    trouble turning head side to side and up  . Diabetes mellitus without complication ([place])   . Hemorrhoids   . High cholesterol   . Hypertension   . Prostate cancer [facility])     Assessment: 74 y/o M admitted with acute respiratory failure and altered mentation found to have a femoral thrombus. Pharmacy consulted to initiate heparin infusion. Last dose of Lovenox was last PM.  04/30/2020 Heparin level = 1.1 is supra-therapeutic after 2000 unit bolus and drip rate increased to 1850 units/hr Heparin infusing via peripheral IV in R forearm Lab drawn via lab peripheral stick per RN Hg down to 10.6>  down trending PLTC down to 126> down trending No bleeding per RN  10/2 acute R DVT 10/3 CT small PE  Goal of Therapy:  Heparin level 0.3-0.7 units/ml Monitor platelets by anticoagulation protocol: Yes   Plan:  Hold heparin x 1 hour Resume at decreased rate of 1550 units/hr Check heparin level 8 hr after heparin resumed Follow CBC and heparin level daily  Eudelia Bunch, Pharm.D 04/30/2020 12:05 PM

## 2020-04-30 NOTE — Progress Notes (Signed)
Daily Progress Note   Patient Name: Jay Holloway       Date: 04/30/2020 DOB: 1946-07-17  Age: 74 y.o. MRN#: [id number] Attending Physician: Mercy Riding, MD Primary Care Physician: [name], MD Admit Date: 04/28/2020  Reason for Consultation/Follow-up: Establishing goals of care  Subjective: Currently off BiPAP.  Resting in bed.  Does not open eyes, does not verbalize, does not follow commands.  Does not appear to be in any acute distress.  No family at bedside.  Discussed with bedside RN.  Length of Stay: 2  Current Medications: Scheduled Meds:  . Chlorhexidine Gluconate Cloth  6 each Topical Daily  . collagenase   Topical Daily  . insulin aspart  0-5 Units Subcutaneous QHS  . insulin aspart  0-9 Units Subcutaneous TID WC  . ipratropium-albuterol  3 mL Nebulization TID  . mouth rinse  15 mL Mouth Rinse BID  . pantoprazole (PROTONIX) IV  40 mg Intravenous Q12H  . sodium chloride flush  10-40 mL Intracatheter Q12H    Continuous Infusions: . azithromycin Stopped (04/30/20 0259)  . cefTRIAXone (ROCEPHIN)  IV Stopped (04/29/20 2318)  . dextrose 5 % and 0.9% NaCl 75 mL/hr at 04/30/20 0302  . heparin 1,850 Units/hr (04/30/20 0218)    PRN Meds: acetaminophen **OR** acetaminophen, albuterol, hydrALAZINE, ondansetron **OR** ondansetron (ZOFRAN) IV, sodium chloride flush  Physical Exam         Appears frail, appears with generalized weakness On supplemental oxygen nasal cannula Resting in bed, mouth open, not awake not alert Coarse breath sounds Abdomen is not distended Does not have edema S1 is 2  Vital Signs: BP (!) 115/48   Pulse (!) 102   Temp 98.4 F (36.9 C) (Oral)   Resp 16   Ht 6' (1.829 m)   Wt 96.4 kg   SpO2 96%   BMI 28.82 kg/m  SpO2: SpO2: 96 % O2 Device:  O2 Device: Nasal Cannula O2 Flow Rate: O2 Flow Rate (L/min): 1 L/min  Intake/output summary:   Intake/Output Summary (Last 24 hours) at 04/30/2020 1140 Last data filed at 04/30/2020 0645 Gross per 24 hour  Intake 473.83 ml  Output 500 ml  Net -26.17 ml   LBM: Last BM Date:  (UTA) Baseline Weight: Weight: 96.4 kg Most recent weight: Weight: 96.4 kg  Palliative Assessment/Data: 10%     Patient Active Problem List   Diagnosis Date Noted  . Hypomagnesemia 04/28/2020  . Mixed diabetic hyperlipidemia associated with type 2 diabetes mellitus ([place]) 04/28/2020  . Pressure injury of sacral region, stage 2 ([place]) 04/19/2020  . Acute diastolic CHF (congestive heart failure) ([place]) 04/19/2020  . Acute respiratory failure with hypoxia and hypercarbia ([place]) 04/17/2020  . Hypernatremia 04/17/2020  . Dysphagia 04/17/2020  . Acute metabolic encephalopathy 88/41/6606  . CKD (chronic kidney disease), stage II 04/17/2020  . Hyperkalemia 04/14/2020  . GERD without esophagitis 04/14/2020  . Slurred speech 04/14/2020  . Generalized weakness 04/14/2020  . AKI (acute kidney injury) ([place]) 03/21/2020  . Prostate cancer ([place])   . Essential hypertension   . Arterial hypotension   . Renal failure   . Type 2 diabetes mellitus with hyperglycemia, with long-term current use of insulin ([place])   . Weakness   . DKA (diabetic ketoacidoses) 11/05/2014    Palliative Care Assessment & Plan   Patient Profile:  74 year old gentleman brought in from nursing facility.  Has past medical history of diabetes hypertension diastolic heart failure chronic kidney disease prostate cancer.  He was hospitalized towards the end of September for possible DKA.  He was discharged to skilled nursing facility.  He has been admitted with hypercapnic respiratory failure.  Off-and-on he has required BiPAP.  Off-and-on he has had encephalopathy.  PCCM has also been following.  Palliative consultation for goals of care discussions  has been requested  Assessment: Found to have acute pulmonary embolism and pneumonia on CTA SLP recommends patient be n.p.o. Serial ABGs are being done, PCO2 better. Time-limited trial of current interventions to see if we can make any progress towards meaningful recovery are being done.  If the patient has ongoing decline or does not improve, will recommend comfort measures.  This has been discussed with the nephew next of kin at the time of initial consultation on 10-2.  Recommendations/Plan: Continue current mode of care Opioids as needed Monitor overall hospital course and overall disease trajectory, consider comfort measures if ongoing lack of meaningful improvement/recovery.   Code Status:    Code Status Orders  (From admission, onward)         Start     Ordered   04/28/20 1938  Do not attempt resuscitation (DNR)  Continuous       Question Answer Comment  In the event of cardiac or respiratory ARREST Do not call a "code blue"   In the event of cardiac or respiratory ARREST Do not perform Intubation, CPR, defibrillation or ACLS   In the event of cardiac or respiratory ARREST Use medication by any route, position, wound care, and other measures to relive pain and suffering. May use oxygen, suction and manual treatment of airway obstruction as needed for comfort.      04/28/20 1937        Code Status History    Date Active Date Inactive Code Status Order ID Comments User Context   04/28/2020 1847 04/28/2020 1937 DNR [id number]  Vernelle Emerald, MD ED   04/14/2020 1839 04/19/2020 2255 DNR [id number]  Mckinley Jewel, MD ED   04/14/2020 1836 04/14/2020 1839 DNR [id number]  Mckinley Jewel, MD ED   03/21/2020 1559 03/25/2020 0542 Full Code [id number]  Karmen Bongo, MD ED   11/05/2014 1947 11/07/2014 1106 Full Code [id number]  Crecencio Mc, MD Inpatient   11/05/2014 1942 11/05/2014 1947 Full Code [id number]  Crecencio Mc, MD ED  Advance Care Planning Activity    Advance Directive  Documentation     Most Recent Value  Type of Advance Directive Out of facility DNR (pink MOST or yellow form)  Pre-existing out of facility DNR order (yellow form or pink MOST form) Pink MOST/Yellow Form most recent copy in chart - Physician notified to receive inpatient order  "MOST" Form in Place? --      Prognosis:  guarded   Discharge Planning: To Be Determined  Care plan was discussed with  RN  Thank you for allowing the Palliative Medicine Team to assist in the care of this patient.   Time In:  11 Time Out: 11.25 Total Time 25 Prolonged Time Billed  no       Greater than 50%  of this time was spent counseling and coordinating care related to the above assessment and plan.  Loistine Chance, MD  Please contact Palliative Medicine Team phone at [phone] for questions and concerns.

## 2020-04-30 NOTE — Plan of Care (Signed)
  Problem: Education: Goal: Knowledge of General Education information will improve Description: Including pain rating scale, medication(s)/side effects and non-pharmacologic comfort measures Outcome: Not Progressing   Problem: Clinical Measurements: Goal: Respiratory complications will improve Outcome: Progressing   Problem: Pain Managment: Goal: General experience of comfort will improve Outcome: Progressing

## 2020-04-30 NOTE — Evaluation (Signed)
Clinical/Bedside Swallow Evaluation Patient Details  Name: Jay Holloway MRN: [id number] Date of Birth: 02-07-1946  Today's Date: 04/30/2020 Time: SLP Start Time (ACUTE ONLY): 0854 SLP Stop Time (ACUTE ONLY): 0907 SLP Time Calculation (min) (ACUTE ONLY): 13 min  Past Medical History:  Past Medical History:  Diagnosis Date  . Arthritis    trouble turning head side to side and up  . Diabetes mellitus without complication ([place])   . Hemorrhoids   . High cholesterol   . Hypertension   . Prostate cancer [facility])    Past Surgical History:  Past Surgical History:  Procedure Laterality Date  . CHOLECYSTECTOMY N/A 11/11/2014   Procedure: LAPAROSCOPIC CHOLECYSTECTOMY WITH INTRAOPERATIVE CHOLANGIOGRAM;  Surgeon: Erroll Luna, MD;  Location: [facility];  Service: General;  Laterality: N/A;  . COLONOSCOPY N/A 11/09/2014   Procedure: COLONOSCOPY;  Surgeon: Juanita Craver, MD;  Location[facility] ENDOSCOPY;  Service: Endoscopy;  Laterality: N/A;  . CYSTOSCOPY WITH RETROGRADE PYELOGRAM, URETEROSCOPY AND STENT PLACEMENT Left 04/18/2014   Procedure: CYSTOSCOPY WITH RETROGRADE PYELOGRAM, URETEROSCOPY AND STENT PLACEMENT;  Surgeon: Raynelle Bring, MD;  Location: [facility] ORS;  Service: Urology;  Laterality: Left;  . HOLMIUM LASER APPLICATION Left 3/71/6967   Procedure: HOLMIUM LASER APPLICATION;  Surgeon: Raynelle Bring, MD;  Location: [facility] ORS;  Service: Urology;  Laterality: Left;  . LYMPHADENECTOMY  06/04/2012   Procedure: LYMPHADENECTOMY;  Surgeon: Dutch Gray, MD;  Location: [facility] ORS;  Service: Urology;  Laterality: Bilateral;  . right  achilles tendon repair  yrs ago  . ROBOT ASSISTED LAPAROSCOPIC RADICAL PROSTATECTOMY  06/04/2012   Procedure: ROBOTIC ASSISTED LAPAROSCOPIC RADICAL PROSTATECTOMY LEVEL 3;  Surgeon: Dutch Gray, MD;  Location: [facility] ORS;  Service: Urology;  Laterality: N/A;   HPI:  74 year old male with past medical history of prostate cancer status post radical prostatectomy, insulin-dependent diabetes mellitus type  2, hypertension, gastroesophageal reflux disease, questionable history of COPD,  questionable history of diastolic ingestive heart failure and recent hospitalization for acute metabolic encephalopathy and acute hypercapnic/hypoxic respiratory failure who presents hospital emergency department from his skilled nursing facility with severe somnolence and hypoxia. Chest CT Left lower lobe opacity suspicious for Bronchopneumonia. Consider aspiration. MBS 03/22/20 flash penetration, osteophytes impinging and narrowing pharyngeal space. Puree/thin recommended. BSE 04/15/20 recommended regular/nectar liquids and ST upgraded to thin liquid 2 days later and Dys 2.    Assessment / Plan / Recommendation Clinical Impression  Concern for inadequate protection of airway and pharyngeal residue in pt with history of dysphagia and possible cervical osteophytes seen on previous MBS. His volitional cough is weak, holds head in slightly posterior extension and coughs/throat clears consistently with puree and increased intensity with thin. It is not recommended pt initiate any food/liquid at this time. He is currently going for chest CT for possible PE and RN states he may need Bipap. Continue oral care and therapist will determine if he is appropriate for instrumental evaluation tomorrow.   SLP Visit Diagnosis: Dysphagia, unspecified (R13.10)    Aspiration Risk  Moderate aspiration risk;Severe aspiration risk    Diet Recommendation NPO   Medication Administration: Via alternative means    Other  Recommendations Oral Care Recommendations: Oral care QID   Follow up Recommendations Skilled Nursing facility      Frequency and Duration min 2x/week  2 weeks       Prognosis Prognosis for Safe Diet Advancement:  (fair-good)      Swallow Study   General HPI: 74 year old male with past medical history of prostate cancer status post radical prostatectomy, insulin-dependent  diabetes mellitus type 2, hypertension,  gastroesophageal reflux disease, questionable history of COPD,  questionable history of diastolic ingestive heart failure and recent hospitalization for acute metabolic encephalopathy and acute hypercapnic/hypoxic respiratory failure who presents hospital emergency department from his skilled nursing facility with severe somnolence and hypoxia. Chest CT Left lower lobe opacity suspicious for Bronchopneumonia. Consider aspiration. MBS 03/22/20 flash penetration, osteophytes impinging and narrowing pharyngeal space. Puree/thin recommended. BSE 04/15/20 recommended regular/nectar liquids and ST upgraded to thin liquid 2 days later and Dys 2.  Type of Study: Bedside Swallow Evaluation Previous Swallow Assessment:  (see HPI) Diet Prior to this Study: NPO Temperature Spikes Noted: No Respiratory Status: Room air History of Recent Intubation: No Behavior/Cognition: Cooperative;Alert;Pleasant mood;Confused;Requires cueing Oral Cavity Assessment: Dry Oral Care Completed by SLP: No Oral Cavity - Dentition: Poor condition;Missing dentition Vision: Functional for self-feeding Self-Feeding Abilities: Able to feed self;Needs assist Patient Positioning: Upright in bed Baseline Vocal Quality: Normal Volitional Cough: Weak Volitional Swallow: Able to elicit    Oral/Motor/Sensory Function Overall Oral Motor/Sensory Function: Within functional limits   Ice Chips Ice chips: Not tested   Thin Liquid Thin Liquid: Impaired Presentation: Cup Pharyngeal  Phase Impairments: Cough - Immediate;Cough - Delayed;Throat Clearing - Delayed;Multiple swallows    Nectar Thick Nectar Thick Liquid: Not tested   Honey Thick Honey Thick Liquid: Not tested   Puree Puree: Impaired Presentation: Spoon Pharyngeal Phase Impairments: Cough - Delayed;Cough - Immediate;Throat Clearing - Delayed;Throat Clearing - Immediate;Multiple swallows   Solid     Solid: Not tested      [name] 04/30/2020,9:27 AM  [name]  [name].Ed Risk analyst [phone] Office [phone]

## 2020-04-30 NOTE — Progress Notes (Signed)
   NAME:  Jay Holloway, MRN:  [id number], DOB:  1945/08/31, LOS: 2 ADMISSION DATE:  04/28/2020, CONSULTATION DATE: 04/28/2020 REFERRING MD: Dr. Cyndia Skeeters, CHIEF COMPLAINT: Hypercapnic respiratory failure  Brief History    Elderly gentleman brought into the hospital for shortness of breath, altered mentation Somnolent at presentation Was placed on BiPAP but kept repeating BiPAP off Placed on oxygen supplementation  Information obtained from family by ED from nephew noted that patient is DNR and during a recent hospitalization was DNR/DNI  History of present illness   Resides in a nursing facility, brought in for altered level of consciousness More somnolent at the nursing facility Was recently discharged about a week ago  Past Medical History   Past Medical History:  Diagnosis Date  . Arthritis    trouble turning head side to side and up  . Diabetes mellitus without complication ([place])   . Hemorrhoids   . High cholesterol   . Hypertension   . Prostate cancer ([place])    Significant Hospital Events   Was initially not able to leave BiPAP on   Consults:  pccm  Procedures:  None  Significant Diagnostic Tests:  ABG-persistent hypercapnia CT head with no acute abnormality Chest x-ray 04/28/2020 reviewed by myself shows no infiltrative process  Micro Data:   None Urine culture with Serratia-susceptibility testing pending  Antimicrobials:  Azithromycin 10/1>> Rocephin 10/1>>  Interim history/subjective:  On oxygen supplementation Arousable and interactive  Objective   Blood pressure (!) 115/48, pulse (!) 102, temperature 98.4 F (36.9 C), temperature source Oral, resp. rate 16, height 6' (1.829 m), weight 96.4 kg, SpO2 96 %.        Intake/Output Summary (Last 24 hours) at 04/30/2020 1203 Last data filed at 04/30/2020 0645 Gross per 24 hour  Intake 473.83 ml  Output 500 ml  Net -26.17 ml   Filed Weights   04/28/20 1143  Weight: 96.4 kg    Examination: Elderly  gentleman, frail Dry oral mucosa Decreased air movement bilaterally S1-S2 appreciated Bowel sounds appreciated   Resolved Hospital Problem list     Assessment & Plan:  Hypercapnic respiratory failure -Secondary to CO2 retention -Appears to be doing relatively well on oxygen supplementation at present -Patient is DNR, DNI from last admission -Avoid sedating medications -Continue bronchodilators -Continue antibiotics to adequately treat his pneumonia  Chronic kidney disease -Trend electrolytes  DVT with CT scan of the chest showing a small clot -On anticoagulation with heparin  Urine showing Serratia -Follow sensitivities  Remains n.p.o.  History of diabetes History of prostate cancer History of diastolic heart failure -Hold diuretics at present and monitor electrolytes  Still with no signs of ongoing infection  Continue BiPAP as tolerated  Sherrilyn Rist, MD [place] PCCM Pager: [phone]

## 2020-04-30 NOTE — Consult Note (Signed)
[facility] Nurse Consult Note: Reason for Consult:Unstgeable pressure injury to coccygeal area.Seen by my partners on 04/17/20 while an inpatient at [facility]. Wound type:Pressure, Unstageable (full thickness) Pressure Injury POA: Yes Measurement: To be obtained by bedside RN today prior to first dressing change with collagenase. Wound [id number] (yellow slough)  Drainage (amount, consistency, odor): small serous to light yellow Periwound: mild erythema Dressing procedure/placement/frequency: Collagenase is restarted today as we do not carry Medihoney in house and that is the product being used in home care.  Bilateral Prevalon Boots are provided for pressure redistribution.  [facility] nursing team will not follow, but will remain available to this patient, the nursing and medical teams.  Please re-consult if needed. Thanks, Maudie Flakes, MSN, RN, [place], Arther Abbott  Pager# [phone]

## 2020-04-30 NOTE — Progress Notes (Signed)
CRITICAL VALUE ALERT  Critical Value:  CO2 - 78.7  Date & Time Notied:  04/30/2020 @ 0425  Provider Notified: E-Link Nurse   Orders Received/Actions taken: Awaiting orders.

## 2020-04-30 NOTE — ED Notes (Signed)
Pt given small sips of orange juice due to CBG results of 68. Pt able to tolerate small sips PO with no difficulty.

## 2020-05-01 ENCOUNTER — Inpatient Hospital Stay (HOSPITAL_COMMUNITY): Payer: Medicare HMO

## 2020-05-01 LAB — CBC
HCT: 32.5 % — ABNORMAL LOW (ref 39.0–52.0)
Hemoglobin: 10 g/dL — ABNORMAL LOW (ref 13.0–17.0)
MCH: 29.1 pg (ref 26.0–34.0)
MCHC: 30.8 g/dL (ref 30.0–36.0)
MCV: 94.5 fL (ref 80.0–100.0)
Platelets: 114 10*3/uL — ABNORMAL LOW (ref 150–400)
RBC: 3.44 MIL/uL — ABNORMAL LOW (ref 4.22–5.81)
RDW: 15.1 % (ref 11.5–15.5)
WBC: 8.6 10*3/uL (ref 4.0–10.5)
nRBC: 0 % (ref 0.0–0.2)

## 2020-05-01 LAB — RENAL FUNCTION PANEL
Albumin: 2.5 g/dL — ABNORMAL LOW (ref 3.5–5.0)
Anion gap: 10 (ref 5–15)
BUN: 25 mg/dL — ABNORMAL HIGH (ref 8–23)
CO2: 33 mmol/L — ABNORMAL HIGH (ref 22–32)
Calcium: 8.1 mg/dL — ABNORMAL LOW (ref 8.9–10.3)
Chloride: 97 mmol/L — ABNORMAL LOW (ref 98–111)
Creatinine, Ser: 1.05 mg/dL (ref 0.61–1.24)
GFR calc Af Amer: >60 mL/min (ref >60)
GFR calc non Af Amer: >60 mL/min (ref >60)
Glucose, Bld: 209 mg/dL — ABNORMAL HIGH (ref 70–99)
Phosphorus: 2.2 mg/dL — ABNORMAL LOW (ref 2.5–4.6)
Potassium: 4.6 mmol/L (ref 3.5–5.1)
Sodium: 140 mmol/L (ref 135–145)

## 2020-05-01 LAB — HEPARIN LEVEL (UNFRACTIONATED)
Heparin Unfractionated: 0.47 IU/mL (ref 0.30–0.70)
Heparin Unfractionated: 0.94 IU/mL — ABNORMAL HIGH (ref 0.30–0.70)
Heparin Unfractionated: 1.3 IU/mL — ABNORMAL HIGH (ref 0.30–0.70)

## 2020-05-01 LAB — GLUCOSE, CAPILLARY
Glucose-Capillary: 245 mg/dL — ABNORMAL HIGH (ref 70–99)
Glucose-Capillary: 202 mg/dL — ABNORMAL HIGH (ref 70–99)
Glucose-Capillary: 186 mg/dL — ABNORMAL HIGH (ref 70–99)
Glucose-Capillary: 124 mg/dL — ABNORMAL HIGH (ref 70–99)

## 2020-05-01 LAB — URINE CULTURE: Culture: >=100000 — AB

## 2020-05-01 LAB — MAGNESIUM: Magnesium: 2.3 mg/dL (ref 1.7–2.4)

## 2020-05-01 MED ORDER — HEPARIN (PORCINE) 25000 UT/250ML-% IV SOLN: 1250.0000 [IU]/h | INTRAVENOUS | Status: DC

## 2020-05-01 MED ORDER — HEPARIN (PORCINE) 25000 UT/250ML-% IV SOLN
1050.0000 [IU]/h | INTRAVENOUS | Status: DC
  Administered 2020-05-01 – 2020-05-02 (×2): 1050 [IU]/h via INTRAVENOUS
  Filled 2020-05-01 (×2): qty 250

## 2020-05-01 NOTE — Progress Notes (Signed)
  Speech Language Pathology Treatment: Dysphagia  Patient Details Name: Jay Holloway MRN: [id number] DOB: 1946-05-09 Today's Date: 05/01/2020 Time: 8828-0034 SLP Time Calculation (min) (ACUTE ONLY): 16 min  Assessment / Plan / Recommendation Clinical Impression  SLP visit with pt clinically due to his appearance of AMS to determine appropriateness for MBS.  Pt did awaken to SLP verbal and tactile stimulation but pulled away from SLPs cold hands.  Pt does not consistently follow directions for exam due to his dementia, however he does attempt to cough when verbally cued - cough is weak.  SLP provided pt with intake of thin via tsp, nectar via tsp and cup and puree via tsp.  Initiation appeared delayed most notably with purees with appearance of larynx bobbing in attempts to trigger, provided tsp of nectar thick which was effective.  Multiple swallows noted across all consistencies - up to 6 wih small bolus- obviously concerning for retention (? oral and pharyngeal).  Concerns are present for pt to have severe dysphagia at this time, note per palliative notes  = plan is for pt to continue treatment - thus will proceed with MBS.    HPI HPI: 74 year old male with past medical history of prostate cancer status post radical prostatectomy, insulin-dependent diabetes mellitus type 2, hypertension, gastroesophageal reflux disease, questionable history of COPD,  questionable history of diastolic ingestive heart failure and recent hospitalization for acute metabolic encephalopathy and acute hypercapnic/hypoxic respiratory failure who presents hospital emergency department from his skilled nursing facility with severe somnolence and hypoxia. Chest CT Left lower lobe opacity suspicious for Bronchopneumonia. Consider aspiration. MBS 03/22/20 flash penetration, osteophytes impinging and narrowing pharyngeal space. Puree/thin recommended. BSE 04/15/20 recommended regular/nectar liquids and ST upgraded to thin liquid 2 days  later and Dys 2.       SLP Plan  MBS (today)       Recommendations  Medication Administration: Via alternative means                Oral Care Recommendations: Oral care QID Follow up Recommendations: Skilled Nursing facility SLP Visit Diagnosis: Dysphagia, unspecified (R13.10) Plan: MBS (today)       GO                Jay Holloway 05/01/2020, 12:21 PM  Kathleen Lime, MS [place] Office [phone]

## 2020-05-01 NOTE — Progress Notes (Signed)
[facility] for Heparin drip Indication: mobile left femoral thrombus  No Known Allergies  Patient Measurements: Height: 6' (182.9 cm) Weight: 96.4 kg (212 lb 8.4 oz) IBW/kg (Calculated) : 77.6  Vital Signs: Temp: 98.2 F (36.8 C) (10/04 0000) Temp Source: Oral (10/04 0000) BP: 110/60 (10/03 1600) Pulse Rate: 95 (10/03 2120)  Labs: Recent Labs    04/28/20 1215 04/28/20 2134 04/28/20 2134 04/29/20 0505 04/29/20 0505 04/29/20 0910 04/29/20 2135 04/30/20 0226 04/30/20 0832 05/01/20 0055  HGB   < >  --   --  11.5*   < >  --   --  10.6*  --  10.0*  HCT   < >  --   --  38.9*  --   --   --  33.7*  --  32.5*  PLT   < >  --   --  152  --   --   --  126*  --  114*  APTT  --  27  --   --   --   --   --   --   --   --   LABPROT  --  12.0  --   --   --   --   --   --   --   --   INR  --  0.9  --   --   --   --   --   --   --   --   HEPARINUNFRC  --   --   --   --   --   --  <0.10*  --  1.10* 1.30*  CREATININE  --  2.12*   < >  --   --  1.73*  --  1.45*  1.44*  --  1.05  TROPONINIHS  --  61*  --   --   --   --   --   --   --   --    < > = values in this interval not displayed.    Estimated Creatinine Clearance: 75.4 mL/min (by C-G formula based on SCr of 1.05 mg/dL).   Medical History: Past Medical History:  Diagnosis Date  . Arthritis    trouble turning head side to side and up  . Diabetes mellitus without complication ([place])   . Hemorrhoids   . High cholesterol   . Hypertension   . Prostate cancer [facility])     Assessment: 74 y/o M admitted with acute respiratory failure and altered mentation found to have a femoral thrombus. Pharmacy consulted to initiate heparin infusion. Last dose of Lovenox was last PM.   05/01/2020 Heparin level = 1.3 is supra-therapeutic despite earlier holding of heparin x 1 hr and then reducing rate to 1550 units/hr  Hgb 10 PLTC down to 114> down trending SCr down to 1.05 No bleeding per RN  10/2 acute R  DVT 10/3 CT small PE  Goal of Therapy:  Heparin level 0.3-0.7 units/ml Monitor platelets by anticoagulation protocol: Yes   Plan:  Hold heparin x 1 hour Resume at decreased rate of 1250 units/hr Check heparin level 8 hr after heparin resumed Follow CBC and heparin level daily  Leone Haven, PharmD 05/01/2020 3:24 AM

## 2020-05-01 NOTE — TOC Progression Note (Signed)
Transition of Care [facility]) - Progression Note    Patient Details  Name: Jay Holloway MRN: [id number] Date of Birth: 1945/11/06  Transition of Care [facility]) CM/SW Contact  Leeroy Cha, RN Phone Number: 05/01/2020, 10:43 AM  Clinical Narrative:    Resides in a nursing facility, brought in for altered level of consciousness More somnolent at the nursing facility Was recently discharged about a week ago Significant Diagnostic Tests:  ABG-persistent hypercapnia CT head with no acute abnormality Urine culture with Serratia-susceptibility testing pending Following for progression and toc needs patient is from [facility] hopeful top return once stable.      Expected Discharge Plan and Services           Expected Discharge Date:  (unknown)                                     Social Determinants of Health (SDOH) Interventions    Readmission Risk Interventions No flowsheet data found.

## 2020-05-01 NOTE — Progress Notes (Signed)
PROGRESS NOTE  Jay Holloway NOT:[id number] DOB: 03-28-1946   PCP: [name], MD  Patient is from: SNF  DOA: 04/28/2020 LOS: 3  Brief Narrative / Interim history: 74 year old male with history of dementia, prostate cancer, DM-2, COPD?  CHF, HTN, GERD and recent hospitalization from 9/17-9/22 with generalized weakness, mild DKA, respiratory failure and encephalopathy brought to ED from SNF with altered mental status, hypoxemia and generalized weakness, and admitted for acute hypoxic hypercapnic respiratory failure, pneumonia, AKI and acute metabolic encephalopathy.  Per EMS, hypoxic to 37% on RA.  Patient has no close family member other than his nephew as next of kin who does not process HC POA.   In ED, hypoxic to 44% on RA and recovered to upper 90s on 50 L by NRB.  Brief tachycardia and tachypnea.  Afebrile.  VBG 7.17/113/42/40.  ABG 7.1 6/117/102/40.5.  WBC 12.9 without left shift.  BNP 328.  Lactic acid negative.  K5.3. Cr 1.99.  BUN 23.  Bicarb 37.  Anion gap 12.  Ammonia within normal.  Borderline LFT. PCCM consulted recommended admission to medical service as patient is DNR and DNI.  Started on BiPAP.  Repeat ABG 7.23/96/77/39.  However, he was put on 3 L by nasal cannula overnight as he kept on removing the BiPAP.   The next morning, some improvement in his encephalopathy.  ABG 7.1 9/100/81/37.  Went back on BiPAP.  Echo with normal EF and no significant structural or functional abnormalities.  LE US revealed acute DVT with mobile thrombus in right common femoral vein, and right posterior tibial vein. UE [place] negative for DVT.  Patient was started on heparin drip.  CTA chest for small left lower lobe PE and left lower lobe pneumonia.  Continued on IV heparin and IV antibiotics.  Palliative medicine following.  Subjective: Seen and examined earlier this morning.  Patient had intermittent apnea with desaturations.  He is somewhat sleepy but briefly wakes to voice.  He is not alert enough  to provide history.  He responds yes to every question.  Follows some commands.  Objective: Vitals:   05/01/20 0500 05/01/20 0600 05/01/20 0803 05/01/20 1300  BP: (!) 126/56 (!) 119/48    Pulse: 89 88 86   Resp: _0 Temp:   97.8 F (36.6 C) (!) 97.5 F (36.4 C)  TempSrc:   Axillary Oral  SpO2: 99% 99% 100%   Weight:      Height:        Intake/Output Summary (Last 24 hours) at 05/01/2020 1434 Last data filed at 05/01/2020 0655 Gross per 24 hour  Intake 2341.81 ml  Output 325 ml  Net 2016.81 ml   Filed Weights   04/28/20 1143  Weight: 96.4 kg    Examination:  GENERAL: Frail looking elderly male. HEENT: MMM.  Vision and hearing grossly intact.  NECK: Supple.  No apparent JVD.  RESP: 97% on 1 L.  No IWOB.  Fair aeration bilaterally. CVS:  RRR. Heart sounds normal.  ABD/GI/GU: BS+. Abd soft, NTND.  MSK/EXT: Slightly moves extremities.  Swelling in LUE. SKIN: no apparent skin lesion or wound NEURO: Sleepy.  Wakes to voice but not alert.  Follows some commands.  No focal neuro deficit. PSYCH: Calm.  No distress or agitation.  Procedures:  None  Microbiology summarized: COVID-19 PCR negative. Blood cultures NGTD. Urine culture with Serratia marcescens sensitive to ceftriaxone  Assessment & Plan: Acute respiratory failure with hypoxia and hypercapnia-might have undiagnosed sleep apnea or COPD.  CTA chest with LLL pneumonia and  small LLL PE.  He has mobile RLE DVT.  -Continue intermittent and nightly BiPAP as tolerated -IV heparin for PE and DVT while n.p.o. -Continue IV ceftriaxone and azithromycin for LLL pneumonia -Scheduled DuoNeb every 6 hours while awake -N.p.o. MBS by SLP today  Acute LLL PE/acute RLE DVT: LE US revealed acute DVT with mobile thrombus in right common femoral vein, and right posterior tibial vein.  CTA showed acute small LLL PE -Continue IV heparin drip while n.p.o.  Severe sepsis due to LLL pneumonia/possible Serratia marcescens UTI:  POA.  Met criteria on admission with leukocytosis, tachypnea and signs of endorgan damage as evidenced by encephalopathy and AKI.  Concern for gram-negative pneumonia due to aspiration.  Noted on CTA chest.  Urine culture with Serratia marcescens sensitive to ceftriaxone.  Blood cultures NGTD. -Continue coverage with IV ceftriaxone and azithromycin -Continue IV fluid -N.p.o. per SLP  Acute metabolic encephalopathy: Likely due to hypercapnia.  Also some element of delirium -Treat treatable causes -Avoid sedating medications -Reorientation and delirium precautions -IV Haldol as needed  Dysphagia -n.p.o. per SLP. -Follow MBS -Patient's nephew wants feeding tube  Mildly elevated troponin: High-sensitivity troponin 61.  EKG with sinus tachycardia, RBBB and LAFB, unchanged from baseline.  Elevated troponin likely demand ischemia from respiratory failure.   Acute kidney injury/mild azotemia: Baseline Cr 0.9-1.0> 1.99 (admit)> 2.12> 1.05.  Likely prerenal.  He was also on Lasix -Continue gentle IV fluid -Avoid nephrotoxic meds-hold Lasix.  DM-2 with hyperglycemia:  Recent Labs  Lab 04/30/20 1215 04/30/20 1616 04/30/20 2227 05/01/20 0806 05/01/20 1345  GLUCAP 142* 151* 162* 245* 202*  -Change SSI to moderate -Monitor CBG -Check hemoglobin A1c  Essential hypertension: Soft blood pressures. -Continue IV fluid  Mild elevated LFT: Likely ischemic.  Resolved. -Continue monitoring  Hyperkalemia: Resolved.  Hypomagnesemia -Replenish and recheck  Leukocytosis with bandemia: Resolved.  DNR/DNI-although his breathing seems to have improved, he seems to have undiagnosed sleep apnea with significant nocturnal desaturations.  He is also not tolerating BiPAP which is concerning.  He significant dysphagia and remains n.p.o.  At this point, meaningful recovery with quality of life is unlikely.  I discussed my concern with patient's nephew, who is the only close family member who assumes [facility] POA  by default.  Patient's nephew is interested in feeding tube if he failed swallowing eval. -Appreciate help by PMT as well.     Body mass index is 28.82 kg/m.         DVT prophylaxis:  On IV heparin  Code Status: DNR/DNI-per Nephew  Family Communication: Updated patient's nephew, [name] over the phone Status is: Inpatient  Remains inpatient appropriate because:Altered mental status, IV treatments appropriate due to intensity of illness or inability to take PO and Inpatient level of care appropriate due to severity of illness   Dispo: The patient is from: SNF              Anticipated d/c is to: SNF              Anticipated d/c date is: > 3 days              Patient currently is not medically stable to d/c.       Consultants:  PCCM Palliative medicine   Sch Meds:  Scheduled Meds:  Chlorhexidine Gluconate Cloth  6 each Topical Daily   collagenase   Topical Daily   insulin aspart  0-5 Units Subcutaneous QHS   insulin aspart  0-9 Units Subcutaneous TID WC   ipratropium-albuterol  3 mL Nebulization TID   mouth rinse  15 mL Mouth Rinse BID   pantoprazole (PROTONIX) IV  40 mg Intravenous Q12H   sodium chloride flush  10-40 mL Intracatheter Q12H   Continuous Infusions:  azithromycin Stopped (05/01/20 0126)   cefTRIAXone (ROCEPHIN)  IV Stopped (04/30/20 2334)   dextrose 5 % and 0.9% NaCl 75 mL/hr at 05/01/20 0655   heparin 1,050 Units/hr (05/01/20 1403)   PRN Meds:.acetaminophen **OR** acetaminophen, albuterol, haloperidol lactate, hydrALAZINE, ondansetron **OR** ondansetron (ZOFRAN) IV, sodium chloride flush  Antimicrobials: Anti-infectives (From admission, onward)   Start     Dose/Rate Route Frequency Ordered Stop   04/28/20 2359  azithromycin (ZITHROMAX) 500 mg in sodium chloride 0.9 % 250 mL IVPB        500 mg 250 mL/hr over 60 Minutes Intravenous Every 24 hours 04/28/20 2344 05/03/20 2359   04/28/20 2230  cefTRIAXone (ROCEPHIN) 1 g in sodium  chloride 0.9 % 100 mL IVPB        1 g 200 mL/hr over 30 Minutes Intravenous Every 24 hours 04/28/20 2200         I have personally reviewed the following labs and images: CBC: Recent Labs  Lab 04/28/20 1215 04/28/20 1333 04/29/20 0505 04/30/20 0226 05/01/20 0055  WBC 12.9*  --  13.2* 7.2 8.6  NEUTROABS 8.3*  --  10.4*  --   --   HGB 13.4 14.6 11.5* 10.6* 10.0*  HCT 44.1 43.0 38.9* 33.7* 32.5*  MCV 93.6  --  97.5 92.1 94.5  PLT 221  --  152 126* 114*   BMP &GFR Recent Labs  Lab 04/28/20 1215 04/28/20 1215 04/28/20 1236 04/28/20 1333 04/28/20 2134 04/29/20 0910 04/30/20 0226 05/01/20 0055  NA 137   < >  --  134* 138 139 139   140 140  K 5.3*   < >  --  5.3* 5.1 5.0 4.8   4.8 4.6  CL 88*   < >  --  89* 90* 93* 94*   94* 97*  CO2 37*  --   --   --  38* 37* 34*   35* 33*  GLUCOSE 286*   < >  --  273* 248* 100* 73   71 209*  BUN 23   < >  --  29* 26* 27* 26*   27* 25*  CREATININE 1.99*   < >  --  2.10* 2.12* 1.73* 1.45*   1.44* 1.05  CALCIUM 9.4  --   --   --  9.0 8.6* 8.1*   8.3* 8.1*  MG  --   --  1.5*  --   --  1.7 1.5* 2.3  PHOS  --   --   --   --  7.1*  --  3.9 2.2*   < > = values in this interval not displayed.   Estimated Creatinine Clearance: 75.4 mL/min (by C-G formula based on SCr of 1.05 mg/dL). Liver & Pancreas: Recent Labs  Lab 04/28/20 1215 04/28/20 2134 04/29/20 0910 04/30/20 0226 05/01/20 0055  AST 48*  --  47* 39  --   ALT 51*  --  45* 39  --   ALKPHOS 100  --  83 79  --   BILITOT 0.9  --  0.7 0.8  --   PROT 7.2  --  6.1* 5.2*  --   ALBUMIN 3.9 3.4* 3.1* 2.7*   2.7* 2.5*   No  results for input(s): LIPASE, AMYLASE in the last 168 hours. Recent Labs  Lab 04/28/20 1236  AMMONIA 12   Diabetic: No results for input(s): HGBA1C in the last 72 hours. Recent Labs  Lab 04/30/20 1215 04/30/20 1616 04/30/20 2227 05/01/20 0806 05/01/20 1345  GLUCAP 142* 151* 162* 245* 202*   Cardiac Enzymes: No results for input(s): CKTOTAL, CKMB,  CKMBINDEX, TROPONINI in the last 168 hours. No results for input(s): PROBNP in the last 8760 hours. Coagulation Profile: Recent Labs  Lab 04/28/20 2134  INR 0.9   Thyroid Function Tests: No results for input(s): TSH, T4TOTAL, FREET4, T3FREE, THYROIDAB in the last 72 hours. Lipid Profile: No results for input(s): CHOL, HDL, LDLCALC, TRIG, CHOLHDL, LDLDIRECT in the last 72 hours. Anemia Panel: No results for input(s): VITAMINB12, FOLATE, FERRITIN, TIBC, IRON, RETICCTPCT in the last 72 hours. Urine analysis:    Component Value Date/Time   COLORURINE AMBER (A) 04/28/2020 2016   APPEARANCEUR CLOUDY (A) 04/28/2020 2016   LABSPEC 1.020 04/28/2020 2016   PHURINE 5.0 04/28/2020 2016   GLUCOSEU NEGATIVE 04/28/2020 2016   HGBUR NEGATIVE 04/28/2020 2016   BILIRUBINUR NEGATIVE 04/28/2020 2016   [place] NEGATIVE 04/28/2020 2016   PROTEINUR 100 (A) 04/28/2020 2016   UROBILINOGEN 0.2 11/05/2014 1712   NITRITE NEGATIVE 04/28/2020 2016   LEUKOCYTESUR LARGE (A) 04/28/2020 2016   Sepsis Labs: Invalid input(s): PROCALCITONIN, [place]  Microbiology: Recent Results (from the past 240 hour(s))  Blood culture (routine x 2)     Status: None (Preliminary result)   Collection Time: 04/28/20 12:30 PM   Specimen: BLOOD  Result Value Ref Range Status   Specimen Description   Final    BLOOD RIGHT ARM UPPER Performed at [facility], [place] [address], [place], [place] [address]    Special Requests   Final    BOTTLES DRAWN AEROBIC ONLY Blood Culture results may not be optimal due to an inadequate volume of blood received in culture bottles Performed at [place] [facility] [address], [place], [place] [address]    Culture   Final    NO GROWTH 3 DAYS Performed at [facility], [place] [address], [place], [place] [address]    Report Status PENDING  Incomplete  Blood culture (routine x 2)     Status: None (Preliminary result)   Collection Time: 04/28/20 12:41  PM   Specimen: BLOOD  Result Value Ref Range Status   Specimen Description   Final    BLOOD LEFT ARM UPPER Performed at [name] [address], [place], [place] [address]    Special Requests   Final    BOTTLES DRAWN AEROBIC AND ANAEROBIC Blood Culture adequate volume Performed at [facility] [address] [place], [place] [address]    Culture   Final    NO GROWTH 3 DAYS Performed at [facility], [place] [address] [place], [place] [address]    Report Status PENDING  Incomplete  Respiratory Panel by RT PCR (Flu A&B, Covid) - Nasopharyngeal Swab     Status: None   Collection Time: 04/28/20  1:21 PM   Specimen: Nasopharyngeal Swab  Result Value Ref Range Status   SARS Coronavirus 2 by RT PCR NEGATIVE NEGATIVE Final    Comment: (NOTE) SARS-CoV-2 target nucleic acids are NOT DETECTED.  The SARS-CoV-2 RNA is generally detectable in upper respiratoy specimens during the acute phase of infection. The lowest concentration of SARS-CoV-2 viral copies this assay can detect  is 131 copies/mL. A negative result does not preclude SARS-Cov-2 infection and should not be used as the sole basis for treatment or other patient management decisions. A negative result may occur with  improper specimen collection/handling, submission of specimen other than nasopharyngeal swab, presence of viral mutation(s) within the areas targeted by this assay, and inadequate number of viral copies (<131 copies/mL). A negative result must be combined with clinical observations, patient history, and epidemiological information. The expected result is Negative.  Fact Sheet for Patients:  PinkCheek.be  Fact Sheet for Healthcare Providers:  GravelBags.it  This test is no t yet approved or cleared by the [place] FDA and  has been authorized for detection and/or diagnosis of SARS-CoV-2 by FDA under an  Emergency Use Authorization (EUA). This EUA will remain  in effect (meaning this test can be used) for the duration of the COVID-19 declaration under Section 564(b)(1) of the Act, 21 U.S.C. section 360bbb-3(b)(1), unless the authorization is terminated or revoked sooner.     Influenza A by PCR NEGATIVE NEGATIVE Final   Influenza B by PCR NEGATIVE NEGATIVE Final    Comment: (NOTE) The Xpert Xpress SARS-CoV-2/FLU/RSV assay is intended as an aid in  the diagnosis of influenza from Nasopharyngeal swab specimens and  should not be used as a sole basis for treatment. Nasal washings and  aspirates are unacceptable for Xpert Xpress SARS-CoV-2/FLU/RSV  testing.  Fact Sheet for Patients: PinkCheek.be  Fact Sheet for Healthcare Providers: GravelBags.it  This test is not yet approved or cleared by the [place] FDA and  has been authorized for detection and/or diagnosis of SARS-CoV-2 by  FDA under an Emergency Use Authorization (EUA). This EUA will remain  in effect (meaning this test can be used) for the duration of the  Covid-19 declaration under Section 564(b)(1) of the Act, 21  U.S.C. section 360bbb-3(b)(1), unless the authorization is  terminated or revoked. Performed at [facility], [place] [address] [place], [place] [address]   Culture, Urine     Status: Abnormal   Collection Time: 04/28/20  8:16 PM   Specimen: Urine, Catheterized  Result Value Ref Range Status   Specimen Description   Final    URINE, CATHETERIZED Performed at [facility] [address], [place], [place] [address]    Special Requests   Final    NONE Performed at [facility], [place] [address] [place], [place] [address]    Culture >=100,000 COLONIES/mL SERRATIA MARCESCENS (A)  Final   Report Status 05/01/2020 FINAL  Final   Organism ID, Bacteria SERRATIA MARCESCENS (A)  Final       Susceptibility   Serratia marcescens - MIC*    CEFAZOLIN >=64 RESISTANT Resistant     CEFTRIAXONE <=0.25 SENSITIVE Sensitive     CIPROFLOXACIN <=0.25 SENSITIVE Sensitive     GENTAMICIN <=1 SENSITIVE Sensitive     NITROFURANTOIN 128 RESISTANT Resistant     TRIMETH/SULFA <=20 SENSITIVE Sensitive     * >=100,000 COLONIES/mL SERRATIA MARCESCENS  MRSA PCR Screening     Status: None   Collection Time: 04/30/20  8:21 PM   Specimen: Nasal Mucosa; Nasopharyngeal  Result Value Ref Range Status   MRSA by PCR NEGATIVE NEGATIVE Final    Comment:        The GeneXpert MRSA Assay (FDA approved for NASAL specimens only), is one component of a comprehensive MRSA colonization surveillance program. It is not intended to diagnose MRSA infection nor to guide or monitor treatment  for MRSA infections. Performed at [facility], [place] [address], [place], [place] [address]     Radiology Studies: No results found.   Brinson Tozzi T. [place]  If 7PM-7AM, please contact night-coverage [email or web address] 05/01/2020, 2:34 PM

## 2020-05-01 NOTE — Progress Notes (Signed)
ANTICOAGULATION CONSULT NOTE  Pharmacy Consult for Heparin drip Indication: mobile left femoral thrombus  No Known Allergies  Patient Measurements: Height: 6' (182.9 cm) Weight: 96.4 kg (212 lb 8.4 oz) IBW/kg (Calculated) : 77.6  Vital Signs: Temp: 97.8 F (36.6 C) (10/04 0803) Temp Source: Axillary (10/04 0803) BP: 119/48 (10/04 0600) Pulse Rate: 86 (10/04 0803)  Labs: Recent Labs    04/28/20 2134 04/28/20 2134 04/29/20 0505 04/29/20 0910 04/29/20 2135 04/30/20 0226 04/30/20 0832 05/01/20 0055 05/01/20 1207  HGB  --    < > 11.5*  --   --  10.6*  --  10.0*  --   HCT  --   --  38.9*  --   --  33.7*  --  32.5*  --   PLT  --   --  152  --   --  126*  --  114*  --   APTT 27  --   --   --   --   --   --   --   --   LABPROT 12.0  --   --   --   --   --   --   --   --   INR 0.9  --   --   --   --   --   --   --   --   HEPARINUNFRC  --   --   --   --    < >  --  1.10* 1.30* 0.94*  CREATININE 2.12*   < >  --  1.73*  --  1.45*  1.44*  --  1.05  --   TROPONINIHS 61*  --   --   --   --   --   --   --   --    < > = values in this interval not displayed.    Estimated Creatinine Clearance: 75.4 mL/min (by C-G formula based on SCr of 1.05 mg/dL).  Assessment: 74 y/o M admitted with acute respiratory failure and altered mentation found to have a femoral thrombus. Pharmacy consulted to initiate heparin infusion. Last dose of Lovenox was last PM.   05/01/2020 Heparin level = 0.9 remains supra-therapeutic despite earlier holding of heparin x 1 hr and then reducing rate to 1250 units/hr  Hgb 10- low but stable PLTC down to 114> down trending SCr down to 1.05 No bleeding per RN  10/2 acute R DVT 10/3 CT small PE  Goal of Therapy:  Heparin level 0.3-0.7 units/ml Monitor platelets by anticoagulation protocol: Yes   Plan:  Decrease heparin  rate to 1050 units/hr Check heparin level 8 hr after heparin resumed Follow CBC and heparin level daily ? Long term anticoagulation plans,  MBS planned for today  Eudelia Bunch, Pharm.D 05/01/2020 1:59 PM

## 2020-05-01 NOTE — Progress Notes (Signed)
   NAME:  Jay Holloway, MRN:  [id number], DOB:  03-Jun-1946, LOS: 3 ADMISSION DATE:  04/28/2020, CONSULTATION DATE: 04/28/2020 REFERRING MD: Dr. Cyndia Skeeters, CHIEF COMPLAINT: Hypercapnic respiratory failure  Brief History    Elderly gentleman brought into the hospital for shortness of breath, altered mentation Somnolent at presentation Was placed on BiPAP but kept repeating BiPAP off Placed on oxygen supplementation  Information obtained from family by ED from nephew noted that patient is DNR and during a recent hospitalization was DNR/DNI  Past Medical History   Past Medical History:  Diagnosis Date  . Arthritis    trouble turning head side to side and up  . Diabetes mellitus without complication ([place])   . Hemorrhoids   . High cholesterol   . Hypertension   . Prostate cancer ([place])    Significant Hospital Events      Consults:  pccm  Procedures:  None  Significant Diagnostic Tests:  ABG-persistent hypercapnia CT head with no acute abnormality Chest x-ray 04/28/2020 reviewed by myself shows no infiltrative process  Micro Data:   Urine culture  10/1: Serratia BCX2 10/1: neg  Antimicrobials:  Azithromycin 10/1>> Rocephin 10/1>>  Interim history/subjective:  No distress seems comfortable   Objective   Blood pressure (Abnormal) 119/48, pulse 86, temperature 98.3 F (36.8 C), temperature source Oral, resp. rate 14, height 6' (1.829 m), weight 96.4 kg, SpO2 100 %.        Intake/Output Summary (Last 24 hours) at 05/01/2020 0946 Last data filed at 05/01/2020 4268 Gross per 24 hour  Intake 2341.81 ml  Output 325 ml  Net 2016.81 ml   Filed Weights   04/28/20 1143  Weight: 96.4 kg    Examination: General: This is a chronically ill-appearing 74 year old male patient he is currently lying in bed and in no acute distress HEENT poor dentition, mucous membranes moist, sclera nonicteric, neck veins flat, does exhibit temporal wasting Pulmonary: Clear to auscultation, no  accessory use.  Diminished bases.  1 L/min via nasal cannula Cardiac: Regular rate and rhythm without murmur rub or gallop Abdomen: Soft nontender nontender Extremities are warm and dry, has chronic lower extremity edema GU clear yellow Neuro: He is awake, moves extremities, intermittently will follow commands, he is verbal but I am unable to understand his communication  Resolved Hospital Problem list     Assessment & Plan:  Acute on chronic Hypercapnic respiratory failure In setting of bibasilar L>R  atx vs PNA (NOS) complicated by what appears to be chronically elevated Right hemidiaphragm  -suspect acute illness from urosepsis superimposed on chronic hypoventilation as well as diaphragmatic dysfxn was driving factor for hypercarbia, suspect underlying deconditioning a significant factor here Plan Cont supplemental oxygen Sat goal > 88% HS BIPAP reasonable if pt tolerates but to date has not Careful w/ narcotics unless switching to full comfort.    Sepsis due to PNA and UTI  Plan Ctx per primary   Acute metabolic encephalopathy 2/2 hypercarbia and sepsis Plan Supportive care  Chronic kidney disease improved Plan Cont to trend   Acute DVT w/ Small PE Plan Cont AC   History of diabetes->still hyperglycemic  Plan ssi   History of diastolic heart failure Plan Tele   History of prostate cancer Plan Nothing to do here   Mild thrombocytopenia Plan Trend cbc  Critical care will sign off  Erick Colace ACNP-BC [place] Pager # [phone] OR # [phone] if no answer

## 2020-05-01 NOTE — Progress Notes (Signed)
Daily Progress Note   Patient Name: Jay Holloway       Date: 05/01/2020 DOB: 1945-12-16  Age: 74 y.o. MRN#: [id number] Attending Physician: Mercy Riding, MD Primary Care Physician: [name], MD Admit Date: 04/28/2020  Reason for Consultation/Follow-up: Establishing goals of care  Subjective: Patient awakens when his name is called, he is able to state his name. how ever, he is not entirely alert. Falls back asleep quickly. Note plans for MBS study that is to be done today. Is from SNF  Length of Stay: 3  Current Medications: Scheduled Meds:  . Chlorhexidine Gluconate Cloth  6 each Topical Daily  . collagenase   Topical Daily  . insulin aspart  0-5 Units Subcutaneous QHS  . insulin aspart  0-9 Units Subcutaneous TID WC  . ipratropium-albuterol  3 mL Nebulization TID  . mouth rinse  15 mL Mouth Rinse BID  . pantoprazole (PROTONIX) IV  40 mg Intravenous Q12H  . sodium chloride flush  10-40 mL Intracatheter Q12H    Continuous Infusions: . azithromycin Stopped (05/01/20 0126)  . cefTRIAXone (ROCEPHIN)  IV Stopped (04/30/20 2334)  . dextrose 5 % and 0.9% NaCl 75 mL/hr at 05/01/20 0655  . heparin 1,250 Units/hr (05/01/20 0430)    PRN Meds: acetaminophen **OR** acetaminophen, albuterol, haloperidol lactate, hydrALAZINE, ondansetron **OR** ondansetron (ZOFRAN) IV, sodium chloride flush  Physical Exam         Appears frail, appears with generalized weakness On supplemental oxygen nasal cannula Resting in bed, awakens some but then falls back asleep Coarse breath sounds Abdomen is not distended Does not have edema S1 is 2  Vital Signs: BP (!) 119/48   Pulse 86   Temp 97.8 F (36.6 C) (Axillary)   Resp 14   Ht 6' (1.829 m)   Wt 96.4 kg   SpO2 100%   BMI 28.82 kg/m  SpO2:  SpO2: 100 % O2 Device: O2 Device: Nasal Cannula O2 Flow Rate: O2 Flow Rate (L/min): 6 L/min  Intake/output summary:   Intake/Output Summary (Last 24 hours) at 05/01/2020 1302 Last data filed at 05/01/2020 0655 Gross per 24 hour  Intake 2341.81 ml  Output 325 ml  Net 2016.81 ml   LBM: Last BM Date: 04/30/20 Baseline Weight: Weight: 96.4 kg Most recent weight: Weight: 96.4 kg  Palliative Assessment/Data: 30%     Patient Active Problem List   Diagnosis Date Noted  . Hypomagnesemia 04/28/2020  . Mixed diabetic hyperlipidemia associated with type 2 diabetes mellitus ([name]) 04/28/2020  . Pressure injury of sacral region, stage 2 ([name]) 04/19/2020  . Acute diastolic CHF (congestive heart failure) ([name]) 04/19/2020  . Acute respiratory failure with hypoxia and hypercarbia ([name]) 04/17/2020  . Hypernatremia 04/17/2020  . Dysphagia 04/17/2020  . Acute metabolic encephalopathy 72/53/6644  . CKD (chronic kidney disease), stage II 04/17/2020  . Hyperkalemia 04/14/2020  . GERD without esophagitis 04/14/2020  . Slurred speech 04/14/2020  . Generalized weakness 04/14/2020  . AKI (acute kidney injury) ([name]) 03/21/2020  . Prostate cancer ([name])   . Essential hypertension   . Arterial hypotension   . Renal failure   . Type 2 diabetes mellitus with hyperglycemia, with long-term current use of insulin ([name])   . Weakness   . DKA (diabetic ketoacidoses) 11/05/2014    Palliative Care Assessment & Plan   Patient Profile:  74 year old gentleman brought in from nursing facility.  Has past medical history of diabetes hypertension diastolic heart failure chronic kidney disease prostate cancer.  He was hospitalized towards the end of September for possible DKA.  He was discharged to skilled nursing facility.  He has been admitted with hypercapnic respiratory failure.  Off-and-on he has required BiPAP.  Off-and-on he has had encephalopathy.  PCCM has also been following.  Palliative consultation  for goals of care discussions has been requested  Assessment: Found to have acute pulmonary embolism and pneumonia on CTA SLP recommends patient be n.p.o. MBS on 10-4 Time-limited trial of current interventions to see if we can make any progress towards meaningful recovery are being done.  If the patient has ongoing decline or does not improve, will recommend comfort measures.  This has been discussed with the nephew next of kin at the time of initial consultation on 10-2.  Recommendations/Plan: Continue current mode of care Opioids as needed Monitor overall hospital course and overall disease trajectory, consider comfort measures if ongoing lack of meaningful improvement/recovery. Follow MBS results, follow PO intake or lack thereof.  Back to [facility] with hospice versus residential hospice on discharge is recommended. PMT to follow.     Code Status:    Code Status Orders  (From admission, onward)         Start     Ordered   04/28/20 1938  Do not attempt resuscitation (DNR)  Continuous       Question Answer Comment  In the event of cardiac or respiratory ARREST Do not call a "code blue"   In the event of cardiac or respiratory ARREST Do not perform Intubation, CPR, defibrillation or ACLS   In the event of cardiac or respiratory ARREST Use medication by any route, position, wound care, and other measures to relive pain and suffering. May use oxygen, suction and manual treatment of airway obstruction as needed for comfort.      04/28/20 1937        Code Status History    Date Active Date Inactive Code Status Order ID Comments User Context   04/28/2020 1847 04/28/2020 1937 DNR [id number]  Vernelle Emerald, MD ED   04/14/2020 1839 04/19/2020 2255 DNR [id number]  Mckinley Jewel, MD ED   04/14/2020 1836 04/14/2020 1839 DNR [id number]  Mckinley Jewel, MD ED   03/21/2020 1559 03/25/2020 0542 Full Code [id number]  Karmen Bongo, MD ED   11/05/2014 1947  11/07/2014 1106 Full Code  [id number]  Crecencio Mc, MD Inpatient   11/05/2014 1942 11/05/2014 1947 Full Code [id number]  Crecencio Mc, MD ED   Advance Care Planning Activity    Advance Directive Documentation     Most Recent Value  Type of Advance Directive Out of facility DNR (pink MOST or yellow form)  Pre-existing out of facility DNR order (yellow form or pink MOST form) Pink MOST/Yellow Form most recent copy in chart - Physician notified to receive inpatient order  "MOST" Form in Place? --      Prognosis:  guarded   Discharge Planning: Back to [facility] with hospice versus residential hospice, depending on hospital course, MBS results, PO intake.  Care plan was discussed with IDT  Thank you for allowing the Palliative Medicine Team to assist in the care of this patient.   Time In:  11 Time Out: 11.25 Total Time 25 Prolonged Time Billed  no       Greater than 50%  of this time was spent counseling and coordinating care related to the above assessment and plan.  Loistine Chance, MD  Please contact Palliative Medicine Team phone at [phone] for questions and concerns.

## 2020-05-01 NOTE — Progress Notes (Signed)
Modified Barium Swallow Progress Note  Patient Details  Name: Jay Holloway MRN: [id number] Date of Birth: [birth year]  Today's Date: 05/01/2020  Modified Barium Swallow completed.  Full report located under Chart Review in the Imaging Section.  Brief recommendations include the following:  Clinical Impression  Pt presents with moderate oral and gross pharyngeal dysphagia which is much worse than prior MBS 03/2020. Minimal barium was provided due to aspiration and GROSS pharyngeal retention. Pharyngeal contraction, laryngeal elevation was minimal allowing gross retention without pt sensation.  Few boluses of thin and nectar consistencies provided.    Pt attempted to follow directions including "hocking" and dry swallowing, coughing without effective clearance nor sensation.  Minimal clearance into esophagus noted with most aspirated or retained.  Pharynx remained with barium retention at end of study even after frequent posterior oral suctioning, "hocking" attempts and cough efforts.  His dysphagia is severe enough that he likely would not maintain nutrition and he will aspirate.  Given pt's diagnosis of dementia, multiple comorbidities, lack of evidence for feeding tube improving pt's with dementia outcomes, etc, feasible option in this SLPs opinion, is to allow po intake for comfort.  Will follow up.   Swallow Evaluation Recommendations       SLP Diet Recommendations: NPO;Ice chips PRN after oral care--  Full liquid for comfort if md, pt, family desires.        Medication Administration: Via alternative means               Oral Care Recommendations: Oral care QID      Kathleen Lime, MS [facility] SLP Acute Rehab Services Office [phone]   Macario Golds 05/01/2020,3:46 PM

## 2020-05-02 DIAGNOSIS — D649 Anemia, unspecified: Secondary | ICD-10-CM

## 2020-05-02 LAB — CBC
HCT: 32 % — ABNORMAL LOW (ref 39.0–52.0)
Hemoglobin: 9.6 g/dL — ABNORMAL LOW (ref 13.0–17.0)
MCH: 28.7 pg (ref 26.0–34.0)
MCHC: 30 g/dL (ref 30.0–36.0)
MCV: 95.5 fL (ref 80.0–100.0)
Platelets: 109 10*3/uL — ABNORMAL LOW (ref 150–400)
RBC: 3.35 MIL/uL — ABNORMAL LOW (ref 4.22–5.81)
RDW: 15.2 % (ref 11.5–15.5)
WBC: 7.6 10*3/uL (ref 4.0–10.5)
nRBC: 0 % (ref 0.0–0.2)

## 2020-05-02 LAB — RENAL FUNCTION PANEL
Albumin: 2.5 g/dL — ABNORMAL LOW (ref 3.5–5.0)
Anion gap: 11 (ref 5–15)
BUN: 24 mg/dL — ABNORMAL HIGH (ref 8–23)
CO2: 34 mmol/L — ABNORMAL HIGH (ref 22–32)
Calcium: 8.1 mg/dL — ABNORMAL LOW (ref 8.9–10.3)
Chloride: 98 mmol/L (ref 98–111)
Creatinine, Ser: 0.89 mg/dL (ref 0.61–1.24)
GFR calc Af Amer: >60 mL/min (ref >60)
GFR calc non Af Amer: >60 mL/min (ref >60)
Glucose, Bld: 184 mg/dL — ABNORMAL HIGH (ref 70–99)
Phosphorus: 2.4 mg/dL — ABNORMAL LOW (ref 2.5–4.6)
Potassium: 4.9 mmol/L (ref 3.5–5.1)
Sodium: 143 mmol/L (ref 135–145)

## 2020-05-02 LAB — MAGNESIUM: Magnesium: 2.2 mg/dL (ref 1.7–2.4)

## 2020-05-02 LAB — GLUCOSE, CAPILLARY
Glucose-Capillary: 206 mg/dL — ABNORMAL HIGH (ref 70–99)
Glucose-Capillary: 177 mg/dL — ABNORMAL HIGH (ref 70–99)
Glucose-Capillary: 129 mg/dL — ABNORMAL HIGH (ref 70–99)
Glucose-Capillary: 171 mg/dL — ABNORMAL HIGH (ref 70–99)

## 2020-05-02 LAB — HEPARIN LEVEL (UNFRACTIONATED): Heparin Unfractionated: 0.38 IU/mL (ref 0.30–0.70)

## 2020-05-02 MED ORDER — IPRATROPIUM-ALBUTEROL 0.5-2.5 (3) MG/3ML IN SOLN
3.0000 mL | Freq: Two times a day (BID) | RESPIRATORY_TRACT | Status: DC
  Administered 2020-05-02 – 2020-05-03 (×3): 3 mL via RESPIRATORY_TRACT
  Filled 2020-05-02 (×3): qty 3

## 2020-05-02 MED ORDER — ACETAMINOPHEN 500 MG PO TABS
1000.0000 mg | ORAL_TABLET | Freq: Three times a day (TID) | ORAL | Status: DC | PRN
  Administered 2020-05-02 – 2020-05-05 (×5): 1000 mg via ORAL
  Filled 2020-05-02 (×5): qty 2

## 2020-05-02 MED ORDER — SULFAMETHOXAZOLE-TRIMETHOPRIM 800-160 MG PO TABS
1.0000 | ORAL_TABLET | Freq: Two times a day (BID) | ORAL | Status: AC
  Administered 2020-05-03 – 2020-05-04 (×4): 1 via ORAL
  Filled 2020-05-02 (×4): qty 1

## 2020-05-02 NOTE — Progress Notes (Addendum)
  Speech Language Pathology Treatment: Dysphagia  Patient Details Name: Jay Holloway MRN: [id number] DOB: 07/01/1946 Today's Date: 05/02/2020 Time: 2751-7001 SLP Time Calculation (min) (ACUTE ONLY): 30 min  Assessment / Plan / Recommendation Clinical Impression  SLP clinical follow up to assess for improvement in swallow function compared to yesterday's MBS due to current mentation.  Pt tonight awake in bed, stating he wants to drink liquids. Rn reports she provided pt with medications with applesauce without him coughing. He continues to demonstrate overt coughing with liquid intake (coughing due to aspiration) stating this is normal for him over the last few months.  He is also continuing to conduct multiple swallows across all consistencies- retention in pharynx on MBS did not clear.    SLP reviewed with pt his prior MBS from later August 2021 and study from October 4th (yesterday) to aid in his understanding re: changes in swallowing with increasing aspiration and worsening retention.    SLP will phone his SNF in the am to find out how he has been doing in the last month (pt provided permission).  Pt likely will continue to aspirate and suspect this has been ongoing for at least the last month.  Pt verbalized to this SLP that he enjoys eating and he does not want a feeding tube.   SLP does not anticipate short term feeding tube will change pt's outcomes given chronicity of his dysphagia.  In this SLP's opinion, repeating an MBS will not change his outcomes and would advise to consider discussion with pt/family regarding pt's wishes, chronicity of dysphagia, etc.  Will follow up next date. Thanks for allowing me to help care for this pt.    HPI HPI: 74 year old male with past medical history of prostate cancer status post radical prostatectomy, insulin-dependent diabetes mellitus type 2, hypertension, gastroesophageal reflux disease, questionable history of COPD,  questionable history of  diastolic ingestive heart failure and recent hospitalization for acute metabolic encephalopathy and acute hypercapnic/hypoxic respiratory failure who presents hospital emergency department from his skilled nursing facility with severe somnolence and hypoxia. Chest CT Left lower lobe opacity suspicious for Bronchopneumonia. Consider aspiration. MBS 03/22/20 flash penetration, osteophytes impinging and narrowing pharyngeal space. Puree/thin recommended. BSE 04/15/20 recommended regular/nectar liquids and ST upgraded to thin liquid 2 days later and Dys 2.   Pt underwent MBS yesterday with findings of severe dysphagia, pharyngeal retention and aspiration.  MD indicates mentation had improved and wants SLP to reeval, however when saw pt at approx 1215, he was not alert. Thus follow up indicated.      SLP Plan  Continue with current plan of care       Recommendations  Diet recommendations: Thin liquid (tsps thin, medicine with applesauce crushed- floor stock items only) Liquids provided via: Cup;Straw Medication Administration: Crushed with puree Supervision: Patient able to self feed;Intermittent supervision to cue for compensatory strategies Compensations: Minimize environmental distractions;Small sips/bites;Slow rate;Follow solids with liquid;Multiple dry swallows after each bite/sip Postural Changes and/or Swallow Maneuvers: Seated upright 90 degrees                Oral Care Recommendations: Oral care BID Follow up Recommendations: Skilled Nursing facility SLP Visit Diagnosis: Dysphagia, oropharyngeal phase (R13.12) Plan: Continue with current plan of care       GO                Macario Golds 05/02/2020, 8:03 PM  Kathleen Lime, MS [place] Office [phone]

## 2020-05-02 NOTE — NC FL2 (Signed)
[place] MEDICAID FL2 LEVEL OF CARE SCREENING TOOL     IDENTIFICATION  Patient Name: Jay Holloway Birthdate: 06/19/1946 Sex: male Admission Date (Current Location): 04/28/2020  [facility] and [place] Number:  Herbalist and Address:  [facility]  [place] [address] [place]      Provider Number: [id number]  Attending Physician Name and Address:  Mercy Riding, MD  Relative Name and Phone Number:  Holley Bouche    Current Level of Care: Hospital Recommended Level of Care: [place] Prior Approval Number:    Date Approved/Denied:   PASRR Number: [id number]  Discharge Plan: SNF    Current Diagnoses: Patient Active Problem List   Diagnosis Date Noted  . Hypomagnesemia 04/28/2020  . Mixed diabetic hyperlipidemia associated with type 2 diabetes mellitus ([place]) 04/28/2020  . Pressure injury of sacral region, stage 2 ([place]) 04/19/2020  . Acute diastolic CHF (congestive heart failure) ([place]) 04/19/2020  . Acute respiratory failure with hypoxia and hypercarbia ([place]) 04/17/2020  . Hypernatremia 04/17/2020  . Dysphagia 04/17/2020  . Acute metabolic encephalopathy 97/98/9211  . CKD (chronic kidney disease), stage II 04/17/2020  . Hyperkalemia 04/14/2020  . GERD without esophagitis 04/14/2020  . Slurred speech 04/14/2020  . Generalized weakness 04/14/2020  . AKI (acute kidney injury) ([place]) 03/21/2020  . Prostate cancer ([place])   . Essential hypertension   . Arterial hypotension   . Renal failure   . Type 2 diabetes mellitus with hyperglycemia, with long-term current use of insulin ([place])   . Weakness   . DKA (diabetic ketoacidoses) 11/05/2014    Orientation RESPIRATION BLADDER Height & Weight     Self, Situation, Time, Place  Normal Incontinent Weight: 96.4 kg Height:  6' (182.9 cm)  BEHAVIORAL SYMPTOMS/MOOD NEUROLOGICAL BOWEL NUTRITION STATUS      Continent Diet (se dc summary)  AMBULATORY STATUS COMMUNICATION OF NEEDS Skin    Limited Assist Verbally Normal                       Personal Care Assistance Level of Assistance  Bathing, Feeding, Dressing Bathing Assistance: Limited assistance   Dressing Assistance: Limited assistance     Functional Limitations Info  Sight Sight Info: Impaired Hearing Info: Adequate Speech Info: Impaired    SPECIAL CARE FACTORS FREQUENCY  PT (By licensed PT), OT (By licensed OT)     PT Frequency: 5-7x weekly OT Frequency: 5-7xweekly            Contractures      Additional Factors Info  Code Status Code Status Info: DNR Allergies Info: nka           Current Medications (05/02/2020):  This is the current hospital active medication list Current Facility-Administered Medications  Medication Dose Route Frequency Provider Last Rate Last Admin  . acetaminophen (TYLENOL) tablet 650 mg  650 mg Oral Q6H PRN Shalhoub, Sherryll Burger, MD       Or  . acetaminophen (TYLENOL) suppository 650 mg  650 mg Rectal Q6H PRN Vernelle Emerald, MD   650 mg at 05/01/20 2305  . albuterol (PROVENTIL) (2.5 MG/3ML) 0.083% nebulizer solution 2.5 mg  2.5 mg Nebulization Q4H PRN Shalhoub, Sherryll Burger, MD      . azithromycin (ZITHROMAX) 500 mg in sodium chloride 0.9 % 250 mL IVPB  500 mg Intravenous Q24H Shalhoub, Sherryll Burger, MD   Stopped at 05/02/20 0200  . cefTRIAXone (ROCEPHIN) 1 g in sodium chloride 0.9 % 100 mL IVPB  1 g Intravenous Q24H Vernelle Emerald, MD   Stopped at 05/01/20 2238  . Chlorhexidine Gluconate Cloth 2 % PADS 6 each  6 each Topical Daily Mercy Riding, MD   6 each at 04/30/20 1048  . collagenase (SANTYL) ointment   Topical Daily Mercy Riding, MD   Given at 04/30/20 1501  . dextrose 5 %-0.9 % sodium chloride infusion   Intravenous Continuous Stretch, Marily Lente, MD 75 mL/hr at 05/02/20 0900 Rate Verify at 05/02/20 0900  . haloperidol lactate (HALDOL) injection 2 mg  2 mg Intravenous Q6H PRN Wendee Beavers T, MD   2 mg at 05/01/20 2311  . heparin ADULT infusion 100 units/mL  (25000 units/263mL sodium chloride 0.45%)  1,050 Units/hr Intravenous Continuous BellSharyn Lull T, RPH 10.5 mL/hr at 05/02/20 0900 1,050 Units/hr at 05/02/20 0900  . hydrALAZINE (APRESOLINE) injection 10 mg  10 mg Intravenous Q6H PRN Shalhoub, Sherryll Burger, MD      . insulin aspart (novoLOG) injection 0-5 Units  0-5 Units Subcutaneous QHS Gonfa, Taye T, MD      . insulin aspart (novoLOG) injection 0-9 Units  0-9 Units Subcutaneous TID WC Mercy Riding, MD   3 Units at 05/02/20 [phone]  . ipratropium-albuterol (DUONEB) 0.5-2.5 (3) MG/3ML nebulizer solution 3 mL  3 mL Nebulization TID Wendee Beavers T, MD   3 mL at 05/02/20 0749  . MEDLINE mouth rinse  15 mL Mouth Rinse BID Gonfa, Taye T, MD   15 mL at 05/02/20 0930  . ondansetron (ZOFRAN) tablet 4 mg  4 mg Oral Q6H PRN Shalhoub, Sherryll Burger, MD       Or  . ondansetron [facility]) injection 4 mg  4 mg Intravenous Q6H PRN Shalhoub, Sherryll Burger, MD      . pantoprazole (PROTONIX) injection 40 mg  40 mg Intravenous Q12H Shalhoub, Sherryll Burger, MD   40 mg at 05/02/20 0929  . sodium chloride flush (NS) 0.9 % injection 10-40 mL  10-40 mL Intracatheter Q12H Gonfa, Taye T, MD   10 mL at 05/02/20 0930  . sodium chloride flush (NS) 0.9 % injection 10-40 mL  10-40 mL Intracatheter PRN Mercy Riding, MD         Discharge Medications: Please see discharge summary for a list of discharge medications.  Relevant Imaging Results:  Relevant Lab Results:   Additional Information [id number]  Leeroy Cha, RN

## 2020-05-02 NOTE — Progress Notes (Signed)
PROGRESS NOTE  Jay Holloway OIZ:[id number] DOB: [birth year]   PCP: [name], MD  Patient is from: SNF  DOA: 04/28/2020 LOS: 4  Brief Narrative / Interim history: 74 year old male with history of dementia, prostate cancer, DM-2, COPD?  CHF, HTN, GERD and recent hospitalization from 9/17-9/22 with generalized weakness, mild DKA, respiratory failure and encephalopathy brought to ED from SNF with altered mental status, hypoxemia and generalized weakness, and admitted for acute hypoxic hypercapnic respiratory failure, pneumonia, AKI and acute metabolic encephalopathy.  Per EMS, hypoxic to 37% on RA.  Patient has no close family member other than his nephew as next of kin who does not process HC POA.   In ED, hypoxic to 44% on RA and recovered to upper 90s on 50 L by NRB.  Brief tachycardia and tachypnea.  Afebrile.  VBG 7.17/113/42/40.  ABG 7.1 6/117/102/40.5.  WBC 12.9 without left shift.  BNP 328.  Lactic acid negative.  K5.3. Cr 1.99.  BUN 23.  Bicarb 37.  Anion gap 12.  Ammonia within normal.  Borderline LFT. PCCM consulted recommended admission to medical service as patient is DNR and DNI.  Started on BiPAP.  Repeat ABG 7.23/96/77/39.  However, he was put on 3 L by nasal cannula overnight as he kept on removing the BiPAP.   The next morning, some improvement in his encephalopathy.  ABG 7.1 9/100/81/37.  Went back on BiPAP.  Echo with normal EF and no significant structural or functional abnormalities.  LE US revealed acute DVT with mobile thrombus in right common femoral vein, and right posterior tibial vein. UE [place] negative for DVT.  Patient was started on heparin drip. CTA chest for small left lower lobe PE and left lower lobe pneumonia.  Continued on IV heparin and IV antibiotics.  Palliative medicine following.    Subjective: Seen and examined earlier this morning.  No major events overnight of this morning.  No complaints but not a great historian.  Responds no to chest pain, dyspnea or  belly pain.  He is more alert.  He is oriented to self, place, month and year.   Objective: Vitals:   05/02/20 0749 05/02/20 0800 05/02/20 0900 05/02/20 1000  BP:  140/61 (!) 153/72 (!) 150/68  Pulse: (!) 101 (!) 102 91 86  Resp: 17 14 (!) 21 12  Temp:  98.3 F (36.8 C)    TempSrc:  Oral    SpO2: 98% 99% 100% 100%  Weight:      Height:        Intake/Output Summary (Last 24 hours) at 05/02/2020 1057 Last data filed at 05/02/2020 1018 Gross per 24 hour  Intake 2467.69 ml  Output 950 ml  Net 1517.69 ml   Filed Weights   04/28/20 1143  Weight: 96.4 kg    Examination:  GENERAL: Frail looking elderly male.  Nontoxic. HEENT: MMM.  Vision and hearing grossly intact.  NECK: Supple.  No apparent JVD.  RESP: 97% on 2 L.  No IWOB.  Fair aeration bilaterally. CVS:  RRR. Heart sounds normal.  ABD/GI/GU: BS+. Abd soft, NTND.  MSK/EXT:  Moves extremities. No apparent deformity. No edema.  SKIN: no apparent skin lesion or wound NEURO: Awake, alert and oriented self, place, month and year.  Follows commands.  No apparent focal neuro deficit. PSYCH: Calm. Normal affect.  Procedures:  None  Microbiology summarized: COVID-19 PCR negative. Blood cultures NGTD. Urine culture with Serratia marcescens sensitive to ceftriaxone  Assessment & Plan: Acute respiratory failure with hypoxia and hypercapnia-might  have undiagnosed sleep apnea or COPD.  CTA chest with LLL pneumonia and  small LLL PE.  He has more mobile right femoral vein DVT.  Respiratory failure improved.  Currently on 2 L. -IV heparin for PE and DVT while n.p.o-we will transition to [place] if cleared by SLP. -Continue IV ceftriaxone and azithromycin for LLL pneumonia -Scheduled DuoNeb every 6 hours while awake -Ask SLP to reevaluate dysphagia given improvement in mental status -Transfer to telemetry  Acute LLL PE/acute RLE DVT: LE US revealed acute DVT with mobile thrombus in right common femoral vein, and right posterior tibial  vein.  CTA showed acute small LLL PE -Continue IV heparin drip while n.p.o.  Severe sepsis due to LLL pneumonia/possible Serratia marcescens UTI: POA.  Met criteria on admission with leukocytosis, tachypnea and signs of endorgan damage as evidenced by encephalopathy and AKI.  Concern for gram-negative pneumonia due to aspiration.  Noted on CTA chest.  Urine culture with Serratia marcescens sensitive to ceftriaxone.  Blood cultures NGTD. -IV ceftriaxone and azithromycin for 5 days followed by Bactrim for two more days to complete tx for UTI. -Continue IV fluid while n.p.o. -Requested SLP to evaluate patient again today.  Acute metabolic encephalopathy: Likely due to hypercapnia.  Also some element of delirium.  Improved.  Oriented to self, place, month and year -Treat treatable causes -Avoid sedating medications -Reorientation and delirium precautions -IV Haldol as needed for agitation  Dysphagia: Failed SLP and MBS but significant improvement in his mental status today -Patient's nephew wants feeding tube -Requested SLP to evaluate patient before NG tube   Mildly elevated troponin: High-sensitivity troponin 61.  EKG with sinus tachycardia, RBBB and LAFB, unchanged from baseline.  Elevated troponin likely demand ischemia from respiratory failure.   Acute kidney injury/mild azotemia: Baseline Cr 0.9-1.0> 1.99 (admit)> 2.12> 0.89.  Likely prerenal.  He was also on Lasix -Continue gentle IV fluid -Avoid nephrotoxic meds-hold Lasix.  DM-2 with hyperglycemia:  Recent Labs  Lab 05/01/20 0806 05/01/20 1345 05/01/20 1623 05/01/20 1950 05/02/20 0802  GLUCAP 245* 202* 186* 124* 206*  -Change SSI to moderate -Add Lantus 5 units twice daily -Check hemoglobin A1c  Essential hypertension: BP slightly elevated. -Decrease IV fluid  Normocytic anemia/thrombocytopenia: Baseline Hgb 10-11>> 13.4 (admit)> 10.0> 9.6.  No signs of bleeding.  Suspect hemodilution from IV fluid. -Continue  monitoring -Anemia panel in the morning  Mild elevated LFT: Likely ischemic.  Resolved. -Continue monitoring  Hyperkalemia: Resolved.  Hypomagnesemia -Replenish and recheck  Metabolic alkalosis: Likely compensation for respiratory acidosis. -Continue monitoring  Leukocytosis with bandemia: Resolved.  DNR/DNI-confirmed with patient's nephew. 10/4-although his breathing seems to have improved, he seems to have undiagnosed sleep apnea with significant nocturnal desaturations.  He is also not tolerating BiPAP which is concerning.  He significant dysphagia and remains n.p.o.  At this point, meaningful recovery with quality of life is unlikely.  I discussed my concern with patient's nephew, who is the only close family member who assumes [facility] POA by default.  Patient's nephew is interested in feeding tube if he failed swallowing eval. -Appreciate help by PMT as well.     Body mass index is 28.82 kg/m.         DVT prophylaxis:  On IV heparin for PE  Code Status: DNR/DNI-per Nephew  Family Communication: Updated patient's nephew, [name] over the phone on 10/4 Status is: Inpatient  Remains inpatient appropriate because:Altered mental status, IV treatments appropriate due to intensity of illness or inability to take PO and Inpatient  level of care appropriate due to severity of illness   Dispo: The patient is from: SNF              Anticipated d/c is to: SNF              Anticipated d/c date is: > 3 days              Patient currently is not medically stable to d/c.       Consultants:  PCCM Palliative medicine   Sch Meds:  Scheduled Meds: . Chlorhexidine Gluconate Cloth  6 each Topical Daily  . collagenase   Topical Daily  . insulin aspart  0-5 Units Subcutaneous QHS  . insulin aspart  0-9 Units Subcutaneous TID WC  . ipratropium-albuterol  3 mL Nebulization TID  . mouth rinse  15 mL Mouth Rinse BID  . pantoprazole (PROTONIX) IV  40 mg Intravenous Q12H  . sodium  chloride flush  10-40 mL Intracatheter Q12H  . [START ON 05/03/2020] sulfamethoxazole-trimethoprim  1 tablet Oral Q12H   Continuous Infusions: . azithromycin Stopped (05/02/20 0200)  . cefTRIAXone (ROCEPHIN)  IV Stopped (05/01/20 2238)  . dextrose 5 % and 0.9% NaCl 75 mL/hr at 05/02/20 1000  . heparin 1,050 Units/hr (05/02/20 1000)   PRN Meds:.acetaminophen **OR** acetaminophen, albuterol, haloperidol lactate, hydrALAZINE, ondansetron **OR** ondansetron (ZOFRAN) IV, sodium chloride flush  Antimicrobials: Anti-infectives (From admission, onward)   Start     Dose/Rate Route Frequency Ordered Stop   05/03/20 1000  sulfamethoxazole-trimethoprim (BACTRIM DS) 800-160 MG per tablet 1 tablet        1 tablet Oral Every 12 hours 05/02/20 1032 05/05/20 0959   04/28/20 2359  azithromycin (ZITHROMAX) 500 mg in sodium chloride 0.9 % 250 mL IVPB        500 mg 250 mL/hr over 60 Minutes Intravenous Every 24 hours 04/28/20 2344 05/03/20 2359   04/28/20 2230  cefTRIAXone (ROCEPHIN) 1 g in sodium chloride 0.9 % 100 mL IVPB        1 g 200 mL/hr over 30 Minutes Intravenous Every 24 hours 04/28/20 2200 05/02/20 2359       I have personally reviewed the following labs and images: CBC: Recent Labs  Lab 04/28/20 1215 04/28/20 1215 04/28/20 1333 04/29/20 0505 04/30/20 0226 05/01/20 0055 05/02/20 0404  WBC 12.9*  --   --  13.2* 7.2 8.6 7.6  NEUTROABS 8.3*  --   --  10.4*  --   --   --   HGB 13.4   < > 14.6 11.5* 10.6* 10.0* 9.6*  HCT 44.1   < > 43.0 38.9* 33.7* 32.5* 32.0*  MCV 93.6  --   --  97.5 92.1 94.5 95.5  PLT 221  --   --  152 126* 114* 109*   < > = values in this interval not displayed.   BMP &GFR Recent Labs  Lab 04/28/20 1236 04/28/20 1333 04/28/20 2134 04/29/20 0910 04/30/20 0226 05/01/20 0055 05/02/20 0404  NA  --    < > 138 139 139  140 140 143  K  --    < > 5.1 5.0 4.8  4.8 4.6 4.9  CL  --    < > 90* 93* 94*  94* 97* 98  CO2  --    < > 38* 37* 34*  35* 33* 34*   GLUCOSE  --    < > 248* 100* 73  71 209* 184*  BUN  --    < >  26* 27* 26*  27* 25* 24*  CREATININE  --    < > 2.12* 1.73* 1.45*  1.44* 1.05 0.89  CALCIUM  --    < > 9.0 8.6* 8.1*  8.3* 8.1* 8.1*  MG 1.5*  --   --  1.7 1.5* 2.3 2.2  PHOS  --   --  7.1*  --  3.9 2.2* 2.4*   < > = values in this interval not displayed.   Estimated Creatinine Clearance: 89 mL/min (by C-G formula based on SCr of 0.89 mg/dL). Liver & Pancreas: Recent Labs  Lab 04/28/20 1215 04/28/20 1215 04/28/20 2134 04/29/20 0910 04/30/20 0226 05/01/20 0055 05/02/20 0404  AST 48*  --   --  47* 39  --   --   ALT 51*  --   --  45* 39  --   --   ALKPHOS 100  --   --  83 79  --   --   BILITOT 0.9  --   --  0.7 0.8  --   --   PROT 7.2  --   --  6.1* 5.2*  --   --   ALBUMIN 3.9   < > 3.4* 3.1* 2.7*  2.7* 2.5* 2.5*   < > = values in this interval not displayed.   No results for input(s): LIPASE, AMYLASE in the last 168 hours. Recent Labs  Lab 04/28/20 1236  AMMONIA 12   Diabetic: No results for input(s): HGBA1C in the last 72 hours. Recent Labs  Lab 05/01/20 0806 05/01/20 1345 05/01/20 1623 05/01/20 1950 05/02/20 0802  GLUCAP 245* 202* 186* 124* 206*   Cardiac Enzymes: No results for input(s): CKTOTAL, CKMB, CKMBINDEX, TROPONINI in the last 168 hours. No results for input(s): PROBNP in the last 8760 hours. Coagulation Profile: Recent Labs  Lab 04/28/20 2134  INR 0.9   Thyroid Function Tests: No results for input(s): TSH, T4TOTAL, FREET4, T3FREE, THYROIDAB in the last 72 hours. Lipid Profile: No results for input(s): CHOL, HDL, LDLCALC, TRIG, CHOLHDL, LDLDIRECT in the last 72 hours. Anemia Panel: No results for input(s): VITAMINB12, FOLATE, FERRITIN, TIBC, IRON, RETICCTPCT in the last 72 hours. Urine analysis:    Component Value Date/Time   COLORURINE AMBER (A) 04/28/2020 2016   APPEARANCEUR CLOUDY (A) 04/28/2020 2016   LABSPEC 1.020 04/28/2020 2016   PHURINE 5.0 04/28/2020 2016   GLUCOSEU  NEGATIVE 04/28/2020 2016   HGBUR NEGATIVE 04/28/2020 2016   BILIRUBINUR NEGATIVE 04/28/2020 2016   [place] NEGATIVE 04/28/2020 2016   PROTEINUR 100 (A) 04/28/2020 2016   UROBILINOGEN 0.2 11/05/2014 1712   NITRITE NEGATIVE 04/28/2020 2016   LEUKOCYTESUR LARGE (A) 04/28/2020 2016   Sepsis Labs: Invalid input(s): PROCALCITONIN, [place]  Microbiology: Recent Results (from the past 240 hour(s))  Blood culture (routine x 2)     Status: None (Preliminary result)   Collection Time: 04/28/20 12:30 PM   Specimen: BLOOD  Result Value Ref Range Status   Specimen Description   Final    BLOOD RIGHT ARM UPPER Performed at [facility], [place] [address], [place], [place] [address]    Special Requests   Final    BOTTLES DRAWN AEROBIC ONLY Blood Culture results may not be optimal due to an inadequate volume of blood received in culture bottles Performed at [facility] [address] [place], [place] [address]    Culture   Final    NO GROWTH 4 DAYS Performed at [facility], [address] [place], [place] [address]    Report Status PENDING  Incomplete  Blood culture (routine x 2)     Status: None (Preliminary result)   Collection Time: 04/28/20 12:41 PM   Specimen: BLOOD  Result Value Ref Range Status   Specimen Description   Final    BLOOD LEFT ARM UPPER Performed at [facility] [address] [place], [place] [address]    Special Requests   Final    BOTTLES DRAWN AEROBIC AND ANAEROBIC Blood Culture adequate volume Performed at [facility] [address], [place], [place] [address]    Culture   Final    NO GROWTH 4 DAYS Performed at [facility], [place] [address], [place],  [address]    Report Status PENDING  Incomplete  Respiratory Panel by RT PCR (Flu A&B, Covid) - Nasopharyngeal Swab     Status: None   Collection Time: 04/28/20  1:21 PM   Specimen: Nasopharyngeal Swab  Result  Value Ref Range Status   SARS Coronavirus 2 by RT PCR NEGATIVE NEGATIVE Final    Comment: (NOTE) SARS-CoV-2 target nucleic acids are NOT DETECTED.  The SARS-CoV-2 RNA is generally detectable in upper respiratoy specimens during the acute phase of infection. The lowest concentration of SARS-CoV-2 viral copies this assay can detect is 131 copies/mL. A negative result does not preclude SARS-Cov-2 infection and should not be used as the sole basis for treatment or other patient management decisions. A negative result may occur with  improper specimen collection/handling, submission of specimen other than nasopharyngeal swab, presence of viral mutation(s) within the areas targeted by this assay, and inadequate number of viral copies (<131 copies/mL). A negative result must be combined with clinical observations, patient history, and epidemiological information. The expected result is Negative.  Fact Sheet for Patients:  PinkCheek.be  Fact Sheet for Healthcare Providers:  GravelBags.it  This test is no t yet approved or cleared by the [place] FDA and  has been authorized for detection and/or diagnosis of SARS-CoV-2 by FDA under an Emergency Use Authorization (EUA). This EUA will remain  in effect (meaning this test can be used) for the duration of the COVID-19 declaration under Section 564(b)(1) of the Act, 21 U.S.C. section 360bbb-3(b)(1), unless the authorization is terminated or revoked sooner.     Influenza A by PCR NEGATIVE NEGATIVE Final   Influenza B by PCR NEGATIVE NEGATIVE Final    Comment: (NOTE) The Xpert Xpress SARS-CoV-2/FLU/RSV assay is intended as an aid in  the diagnosis of influenza from Nasopharyngeal swab specimens and  should not be used as a sole basis for treatment. Nasal washings and  aspirates are unacceptable for Xpert Xpress SARS-CoV-2/FLU/RSV  testing.  Fact Sheet for  Patients: PinkCheek.be  Fact Sheet for Healthcare Providers: GravelBags.it  This test is not yet approved or cleared by the [place] FDA and  has been authorized for detection and/or diagnosis of SARS-CoV-2 by  FDA under an Emergency Use Authorization (EUA). This EUA will remain  in effect (meaning this test can be used) for the duration of the  Covid-19 declaration under Section 564(b)(1) of the Act, 21  U.S.C. section 360bbb-3(b)(1), unless the authorization is  terminated or revoked. Performed at [facility], [place] [address], [place],  [address]   Culture, Urine     Status: Abnormal   Collection Time: 04/28/20  8:16 PM   Specimen: Urine, Catheterized  Result Value Ref Range Status   Specimen  Description   Final    URINE, CATHETERIZED Performed at [facility], [place] [address], [place], [place] [address]    Special Requests   Final    NONE Performed at [facility], [place] [address] [place], [place] [address]    Culture >=100,000 COLONIES/mL SERRATIA MARCESCENS (A)  Final   Report Status 05/01/2020 FINAL  Final   Organism ID, Bacteria SERRATIA MARCESCENS (A)  Final      Susceptibility   Serratia marcescens - MIC*    CEFAZOLIN >=64 RESISTANT Resistant     CEFTRIAXONE <=0.25 SENSITIVE Sensitive     CIPROFLOXACIN <=0.25 SENSITIVE Sensitive     GENTAMICIN <=1 SENSITIVE Sensitive     NITROFURANTOIN 128 RESISTANT Resistant     TRIMETH/SULFA <=20 SENSITIVE Sensitive     * >=100,000 COLONIES/mL SERRATIA MARCESCENS  MRSA PCR Screening     Status: None   Collection Time: 04/30/20  8:21 PM   Specimen: Nasal Mucosa; Nasopharyngeal  Result Value Ref Range Status   MRSA by PCR NEGATIVE NEGATIVE Final    Comment:        The GeneXpert MRSA Assay (FDA approved for NASAL specimens only), is one component of a comprehensive MRSA colonization surveillance  program. It is not intended to diagnose MRSA infection nor to guide or monitor treatment for MRSA infections. Performed at [facility], [place] [address], [place], [place] [address]     Radiology Studies: DG Swallowing Func-Speech Pathology  Result Date: 05/01/2020 Objective Swallowing Evaluation: Type of Study: MBS-Modified Barium Swallow Study  Patient Details Name: Casimer Russett MRN: [id number] Date of Birth: December 29, 1945 Today's Date: 05/01/2020 Time: SLP Start Time (ACUTE ONLY): 1440 -SLP Stop Time (ACUTE ONLY): 1456 SLP Time Calculation (min) (ACUTE ONLY): 16 min Past Medical History: Past Medical History: Diagnosis Date . Arthritis   trouble turning head side to side and up . Diabetes mellitus without complication ([place])  . Hemorrhoids  . High cholesterol  . Hypertension  . Prostate cancer [facility])  Past Surgical History: Past Surgical History: Procedure Laterality Date . CHOLECYSTECTOMY N/A 11/11/2014  Procedure: LAPAROSCOPIC CHOLECYSTECTOMY WITH INTRAOPERATIVE CHOLANGIOGRAM;  Surgeon: Erroll Luna, MD;  Location: [place];  Service: General;  Laterality: N/A; . COLONOSCOPY N/A 11/09/2014  Procedure: COLONOSCOPY;  Surgeon: Juanita Craver, MD;  Location: [facility] ENDOSCOPY;  Service: Endoscopy;  Laterality: N/A; . CYSTOSCOPY WITH RETROGRADE PYELOGRAM, URETEROSCOPY AND STENT PLACEMENT Left 04/18/2014  Procedure: CYSTOSCOPY WITH RETROGRADE PYELOGRAM, URETEROSCOPY AND STENT PLACEMENT;  Surgeon: Raynelle Bring, MD;  Location: [facility];  Service: Urology;  Laterality: Left; . HOLMIUM LASER APPLICATION Left 09/11/863  Procedure: HOLMIUM LASER APPLICATION;  Surgeon: Raynelle Bring, MD;  Location: [facility];  Service: Urology;  Laterality: Left; . LYMPHADENECTOMY  06/04/2012  Procedure: LYMPHADENECTOMY;  Surgeon: Dutch Gray, MD;  Location: [facility];  Service: Urology;  Laterality: Bilateral; . right  achilles tendon repair  yrs ago . ROBOT ASSISTED LAPAROSCOPIC RADICAL PROSTATECTOMY  06/04/2012  Procedure: ROBOTIC  ASSISTED LAPAROSCOPIC RADICAL PROSTATECTOMY LEVEL 3;  Surgeon: Dutch Gray, MD;  Location: [facility];  Service: Urology;  Laterality: N/A; HPI: 74 year old male with past medical history of prostate cancer status post radical prostatectomy, insulin-dependent diabetes mellitus type 2, hypertension, gastroesophageal reflux disease, questionable history of COPD,  questionable history of diastolic ingestive heart failure and recent hospitalization for acute metabolic encephalopathy and acute hypercapnic/hypoxic respiratory failure who presents hospital emergency department from his skilled nursing facility with severe somnolence and hypoxia. Chest CT Left lower lobe opacity suspicious for Bronchopneumonia.  Consider aspiration. MBS 03/22/20 flash penetration, osteophytes impinging and narrowing pharyngeal space. Puree/thin recommended. BSE 04/15/20 recommended regular/nectar liquids and ST upgraded to thin liquid 2 days later and Dys 2.  Subjective: pt awake in chair Assessment / Plan / Recommendation CHL IP CLINICAL IMPRESSIONS 05/01/2020 Clinical Impression Pt presents with moderate oral and gross pharyngeal dysphagia which is much worse than prior MBS 03/2020. Minimal barium was provided due to aspiration and GROSS pharyngeal retention. Pharyngeal contraction, laryngeal elevation was minimal allowing gross retention without pt sensation.  Few boluses of thin and nectar consistencies provided.    Pt attempted to follow directions including "hocking" and dry swallowing, coughing without effective clearance nor sensation.  Minimal clearance into esophagus noted with most aspirated or retained.  Pharynx remained with barium retention at end of study even after frequent posterior oral suctioning, "hocking" attempts and cough efforts.  His dysphagia is severe enough that he likely would not maintain nutrition and he will aspirate.  Given pt's diagnosis of dementia, multiple comorbidities, lack of evidence for feeding tube improving  pt's with dementia outcomes, etc, feasible option in this SLPs opinion, is to allow po intake for comfort.  Will follow up. SLP Visit Diagnosis Dysphagia, oropharyngeal phase (R13.12) Attention and concentration deficit following -- Frontal lobe and executive function deficit following -- Impact on safety and function Moderate aspiration risk;Severe aspiration risk   CHL IP TREATMENT RECOMMENDATION 05/01/2020 Treatment Recommendations Therapy as outlined in treatment plan below   Prognosis 05/01/2020 Prognosis for Safe Diet Advancement Guarded Barriers to Reach Goals Severity of deficits Barriers/Prognosis Comment -- CHL IP DIET RECOMMENDATION 05/01/2020 SLP Diet Recommendations NPO;Ice chips PRN after oral care Liquid Administration via -- Medication Administration Via alternative means Compensations -- Postural Changes --   CHL IP OTHER RECOMMENDATIONS 05/01/2020 Recommended Consults -- Oral Care Recommendations Oral care QID Other Recommendations --   CHL IP FOLLOW UP RECOMMENDATIONS 05/01/2020 Follow up Recommendations Skilled Nursing facility   [facility] IP FREQUENCY AND DURATION 05/01/2020 Speech Therapy Frequency (ACUTE ONLY) min 2x/week Treatment Duration 2 weeks      CHL IP ORAL PHASE 05/01/2020 Oral Phase Impaired Oral - Pudding Teaspoon -- Oral - Pudding Cup -- Oral - Honey Teaspoon -- Oral - Honey Cup -- Oral - Nectar Teaspoon Reduced posterior propulsion;Weak lingual manipulation Oral - Nectar Cup Weak lingual manipulation;Reduced posterior propulsion Oral - Nectar Straw -- Oral - Thin Teaspoon Weak lingual manipulation;Reduced posterior propulsion Oral - Thin Cup Reduced posterior propulsion;Weak lingual manipulation Oral - Thin Straw -- Oral - Puree -- Oral - Mech Soft -- Oral - Regular -- Oral - Multi-Consistency -- Oral - Pill -- Oral Phase - Comment --  CHL IP PHARYNGEAL PHASE 05/01/2020 Pharyngeal Phase Impaired Pharyngeal- Pudding Teaspoon -- Pharyngeal -- Pharyngeal- Pudding Cup -- Pharyngeal -- Pharyngeal-  Honey Teaspoon -- Pharyngeal -- Pharyngeal- Honey Cup -- Pharyngeal -- Pharyngeal- Nectar Teaspoon Reduced tongue base retraction;Reduced airway/laryngeal closure;Reduced pharyngeal peristalsis;Reduced epiglottic inversion;Pharyngeal residue - valleculae;Pharyngeal residue - pyriform Pharyngeal Material does not enter airway Pharyngeal- Nectar Cup Reduced pharyngeal peristalsis;Reduced airway/laryngeal closure;Pharyngeal residue - valleculae;Reduced tongue base retraction;Reduced epiglottic inversion Pharyngeal Material does not enter airway Pharyngeal- Nectar Straw -- Pharyngeal -- Pharyngeal- Thin Teaspoon Reduced pharyngeal peristalsis;Reduced epiglottic inversion;Reduced airway/laryngeal closure;Reduced tongue base retraction;Pharyngeal residue - valleculae Pharyngeal Material does not enter airway Pharyngeal- Thin Cup Reduced laryngeal elevation;Reduced pharyngeal peristalsis;Reduced epiglottic inversion;Reduced anterior laryngeal mobility;Reduced airway/laryngeal closure;Reduced tongue base retraction;Moderate aspiration;Penetration/Aspiration during swallow;Penetration/Apiration after swallow;Significant aspiration (Amount) Pharyngeal Material enters airway, passes BELOW cords without attempt by  patient to eject out (silent aspiration);Material enters airway, passes BELOW cords and not ejected out despite cough attempt by patient Pharyngeal- Thin Straw -- Pharyngeal -- Pharyngeal- Puree -- Pharyngeal -- Pharyngeal- Mechanical Soft -- Pharyngeal -- Pharyngeal- Regular -- Pharyngeal -- Pharyngeal- Multi-consistency -- Pharyngeal -- Pharyngeal- Pill -- Pharyngeal -- Pharyngeal Comment --  CHL IP CERVICAL ESOPHAGEAL PHASE 05/01/2020 Cervical Esophageal Phase Impaired Pudding Teaspoon -- Pudding Cup -- Honey Teaspoon -- Honey Cup -- Nectar Teaspoon Reduced cricopharyngeal relaxation Nectar Cup Reduced cricopharyngeal relaxation Nectar Straw -- Thin Teaspoon Reduced cricopharyngeal relaxation Thin Cup Reduced  cricopharyngeal relaxation Thin Straw -- Puree -- Mechanical Soft -- Regular -- Multi-consistency -- Pill -- Cervical Esophageal Comment -- Kathleen Lime, MS [facility] SLP Acute Rehab Services Office [phone] Macario Golds 05/01/2020, 3:47 PM                [place] [facility] [place]  If 7PM-7AM, please contact night-coverage [email or web address] 05/02/2020, 10:57 AM

## 2020-05-02 NOTE — Progress Notes (Addendum)
SLP Cancellation Note  Patient Details Name: Jay Holloway MRN: [id number] DOB: 03/29/1946   Cancelled treatment:       Reason Eval/Treat Not Completed: Other (comment);Patient's level of consciousness (see notes)  Pt currently is not alert - will not stay awake when stimulated with touch, wet cloth to face and auditory.  He opened his eyes briefly for 10 seconds maximum.   If his mentation will wax/wane as much as this, he will likely be unable to support himself nutritionally.    Of note, pt did follow directions on the MBS yesterday but was too weak to expectorate or effectively swallow to clear pharyngeal retention and his cued cough was weak. I doubt even with improved mentation that his swallow function will improve enough for adequate nutrition without overt aspiration but SLP will continue efforts to evaluate. Thanks.  Kathleen Lime, MS [facility] SLP Acute Rehab Services Office [phone]  Jay Holloway 05/02/2020, 1:03 PM

## 2020-05-02 NOTE — Progress Notes (Signed)
Brief Pharmacy Anti-coagulation Note:  For full details see note from [place] on 10/4  Heparin level 0.47, therapeutic No bleeding noted Continue heparin drip at 1050 units/hr Heparin level in 8 hours  Dolly Rias RPh 05/02/2020, 12:17 AM

## 2020-05-02 NOTE — TOC Progression Note (Addendum)
Transition of Care [facility] - Progression Note    Patient Details  Name: Jay Holloway MRN: [id number] Date of Birth: 25-Jul-1946  Transition of Care [facility] CM/SW Contact  Leeroy Cha, RN Phone Number: 05/02/2020, 9:37 AM  Clinical Narrative:    [facility] to [facility] for review for possible return. Patient accpted back to [facility] care.       Expected Discharge Plan and Services           Expected Discharge Date:  (unknown)                                     Social Determinants of Health (SDOH) Interventions    Readmission Risk Interventions No flowsheet data found.

## 2020-05-02 NOTE — Progress Notes (Signed)
Daily Progress Note   Patient Name: Jay Holloway       Date: 05/02/2020 DOB: 17-Feb-1946  Age: 74 y.o. MRN#: [id number] Attending Physician: Mercy Riding, MD Primary Care Physician: [name], MD Admit Date: 04/28/2020  Reason for Consultation/Follow-up: Establishing goals of care  Subjective: Chart reviewed including personal review of pertinent labs and imaging.  I met today with Jay Holloway.  He was awake and alert at time of the encounter.  He was oriented to person and place but was a little confused on specifics of his situation.  He reported being hungry and wanting orange juice.  He states that another doctor was in and told him he needs to be up and more active and therefore he wants to work with PT and get out of bed later today if possible.  Attempted to discuss his overall clinical situation, but he was not really able to follow conversation fully.  He reports that he is hopeful to be cleared for a diet as he really would like to have some orange juice.  I called and spoke with patient's nephew, Jay Holloway.  We reviewed his clinical situation and concern for continued risk of aspiration.  Discussed that his mental status had improved today and we did request another evaluation by SLP, however, I am worried that aspiration may continue to be an issue moving forward regardless of interventions.  He mentioned being asked for about a feeding tube and we discussed the burdens and benefits associated with this.  Discussed my concern that if he remains confused, he would not understand why he had a feeding tube and may pull it out.  Discussed that this can lead to further unintended consequences such as restraints, bedsores, aspiration, etc.  Overall, Jay Holloway would like to see how [name] he continues to do over the next couple of days and what his chances are of being able to advance to eating by mouth.  He does indicate that at this point, he thinks that his uncle would want to pursue feeding tube if it is offered.   Length of Stay: 4  Current Medications: Scheduled Meds:  . Chlorhexidine Gluconate Cloth  6 each Topical Daily  . collagenase   Topical Daily  . insulin aspart  0-5 Units Subcutaneous QHS  . insulin aspart  0-9 Units Subcutaneous TID WC  . ipratropium-albuterol  3 mL Nebulization BID  . mouth rinse  15 mL Mouth Rinse BID  . pantoprazole (PROTONIX) IV  40 mg Intravenous Q12H  . sodium chloride flush  10-40 mL Intracatheter Q12H  . [START ON 05/03/2020] sulfamethoxazole-trimethoprim  1 tablet Oral Q12H    Continuous Infusions: . azithromycin Stopped (05/02/20 0200)  . cefTRIAXone (ROCEPHIN)  IV Stopped (05/01/20 2238)  . dextrose 5 % and 0.9% NaCl 75 mL/hr at 05/02/20 1200  . heparin 1,050 Units/hr (05/02/20 1407)    PRN Meds: acetaminophen **OR** acetaminophen, albuterol, haloperidol lactate, hydrALAZINE, ondansetron **OR** ondansetron (ZOFRAN) IV, sodium chloride flush  Physical Exam       General: Alert, awake, in no acute distress. Chronically ill-appearing. HEENT: No bruits, no goiter, no JVD Heart: Tachycardic. No murmur appreciated. Lungs: Decreased air movement, scattered coarse Abdomen: Soft, nontender, nondistended, positive bowel sounds.  Ext: No significant edema Skin: Warm and dry Neuro: Grossly intact, nonfocal.  Vital Signs: BP (!) 168/79   Pulse (!) 104   Temp 98.3 F (36.8 C) (Oral)   Resp (!) 21   Ht 6' (1.829 m)   Wt 96.4 kg   SpO2 99%   BMI 28.82 kg/m  SpO2: SpO2: 99 % O2 Device: O2 Device: Nasal Cannula O2 Flow Rate: O2 Flow Rate (L/min): 1 L/min  Intake/output summary:   Intake/Output Summary (Last 24 hours) at 05/02/2020 1536 Last data filed at 05/02/2020 1200 Gross per 24 hour  Intake 2628.13 ml   Output 950 ml  Net 1678.13 ml   LBM: Last BM Date: 05/01/20 Baseline Weight: Weight: 96.4 kg Most recent weight: Weight: 96.4 kg       Palliative Assessment/Data: 30%     Patient Active Problem List   Diagnosis Date Noted  . Hypomagnesemia 04/28/2020  . Mixed diabetic hyperlipidemia associated with type 2 diabetes mellitus ([place]) 04/28/2020  . Pressure injury of sacral region, stage 2 ([place]) 04/19/2020  . Acute diastolic CHF (congestive heart failure) ([place]) 04/19/2020  . Acute respiratory failure with hypoxia and hypercarbia ([place]) 04/17/2020  . Hypernatremia 04/17/2020  . Dysphagia 04/17/2020  . Acute metabolic encephalopathy 98/33/8250  . CKD (chronic kidney disease), stage II 04/17/2020  . Hyperkalemia 04/14/2020  . GERD without esophagitis 04/14/2020  . Slurred speech 04/14/2020  . Generalized weakness 04/14/2020  . AKI (acute kidney injury) ([place]) 03/21/2020  . Prostate cancer ([place])   . Essential hypertension   . Arterial hypotension   . Renal failure   . Type 2 diabetes mellitus with hyperglycemia, with long-term current use of insulin ([place])   . Weakness   . DKA (diabetic ketoacidoses) 11/05/2014    Palliative Care Assessment & Plan   Patient Profile:  74 year old gentleman brought in from nursing facility.  Has past medical history of diabetes hypertension diastolic heart failure chronic kidney disease prostate cancer.  He was hospitalized towards the end of September for possible DKA.  He was discharged to skilled nursing facility.  He has been admitted with hypercapnic respiratory failure.  Off-and-on he has required BiPAP.  Off-and-on he has had encephalopathy.  PCCM has also been following.  Palliative consultation for goals of care discussions has been requested  Assessment: Found to have acute pulmonary embolism and pneumonia on CTA Time-limited trial of current interventions to see if we can make any progress towards meaningful recovery are being done.     Recommendations/Plan: Continue current mode of care Opioids as needed He did poorly with MBS yesterday.  His mental status, however, today appears to be better than it has been the past few days.  We will ask SLP to follow-up again with him today to reevaluate. I called and spoke with his nephew regarding his clinical course and concern for his ability to swallow safely moving forward.  Discussed artificial nutrition and hydration and the burdens associated with this.  We will plan to follow-up again with him tomorrow.   Code Status:    Code Status Orders  (From admission, onward)         Start     Ordered   04/28/20 1938  Do not attempt resuscitation (DNR)  Continuous       Question Answer Comment  In the event of cardiac or respiratory ARREST Do not call a "code blue"   In the event of cardiac or respiratory ARREST Do not perform Intubation, CPR, defibrillation or ACLS   In the event of cardiac or respiratory ARREST Use medication by any route, position, wound care, and other measures to relive pain and suffering. May use oxygen, suction and manual treatment of airway obstruction as needed for comfort.      04/28/20 1937        Code Status History    Date Active Date Inactive Code Status Order ID Comments User Context   04/28/2020 1847 04/28/2020 1937 DNR [id number]  Vernelle Emerald, MD ED   04/14/2020 1839 04/19/2020 2255 DNR [id number]  Mckinley Jewel, MD ED   04/14/2020 1836 04/14/2020 1839 DNR [id number]  Mckinley Jewel, MD ED   03/21/2020 1559 03/25/2020 0542 Full Code [id number]  Karmen Bongo, MD ED   11/05/2014 1947 11/07/2014 1106 Full Code [id number]  Crecencio Mc, MD Inpatient   11/05/2014 1942 11/05/2014 1947 Full Code [id number]  Crecencio Mc, MD ED   Advance Care Planning Activity    Advance Directive Documentation     Most Recent Value  Type of Advance Directive Out of facility DNR (pink MOST or yellow form)  Pre-existing out of facility DNR order (yellow form or pink  MOST form) Pink MOST/Yellow Form most recent copy in chart - Physician notified to receive inpatient order  "MOST" Form in Place? --      Prognosis:  guarded   Discharge Planning: Back to [facility] with hospice versus residential hospice, depending on hospital course, MBS results, PO intake.  Care plan was discussed with IDT  Thank you for allowing the Palliative Medicine Team to assist in the care of this patient.   Time In:  10 Time Out: 1040 Total Time 40 Prolonged Time Billed  no    Greater than 50%  of this time was spent counseling and coordinating care related to the above assessment and plan. Micheline Rough, MD  Please contact Palliative Medicine Team phone at [phone] for questions and concerns.

## 2020-05-02 NOTE — Progress Notes (Signed)
ANTICOAGULATION CONSULT NOTE  Pharmacy Consult for Heparin drip Indication: mobile left femoral thrombus, small PE  No Known Allergies  Patient Measurements: Height: 6' (182.9 cm) Weight: 96.4 kg (212 lb 8.4 oz) IBW/kg (Calculated) : 77.6  Vital Signs: Temp: 98.3 F (36.8 C) (10/05 0800) Temp Source: Oral (10/05 0800) BP: 150/68 (10/05 1000) Pulse Rate: 86 (10/05 1000)  Labs: Recent Labs    04/30/20 0226 04/30/20 2956 05/01/20 0055 05/01/20 0055 05/01/20 1207 05/01/20 2332 05/02/20 0404 05/02/20 0847  HGB 10.6*  --  10.0*  --   --   --  9.6*  --   HCT 33.7*  --  32.5*  --   --   --  32.0*  --   PLT 126*  --  114*  --   --   --  109*  --   HEPARINUNFRC  --    < > 1.30*   < > 0.94* 0.47  --  0.38  CREATININE 1.45*   1.44*  --  1.05  --   --   --  0.89  --    < > = values in this interval not displayed.    Estimated Creatinine Clearance: 89 mL/min (by C-G formula based on SCr of 0.89 mg/dL).  Assessment: 74 y/o M admitted with acute respiratory failure and altered mentation found to have a femoral thrombus. Pharmacy consulted to initiate heparin infusion. Last dose of Lovenox was last PM.   05/02/2020 Heparin level = 0. 38 is therapeutic on heparin at 1050 units/hr  Hgb 9.6 low but stable PLTC down to 109> down trending SCr down to 0.89 No bleeding per RN  10/2 acute R DVT 10/3 CT small PE  Goal of Therapy:  Heparin level 0.3-0.7 units/ml Monitor platelets by anticoagulation protocol: Yes   Plan:  continue heparin at 1050 units/hr follow CBC and heparin level daily ? Long term anticoagulation plans> failed swallow eval 10/4> ? feeding tube  Eudelia Bunch, Pharm.D 05/02/2020 10:23 AM

## 2020-05-02 NOTE — Progress Notes (Signed)
Inpatient Diabetes Program Recommendations  AACE/ADA: New Consensus Statement on Inpatient Glycemic Control (2015)  Target Ranges:  Prepandial:   less than 140 mg/dL      Peak postprandial:   less than 180 mg/dL (1-2 hours)      Critically ill patients:  140 - 180 mg/dL   Lab Results  Component Value Date   GLUCAP 206 (H) 05/02/2020   HGBA1C 6.2 (H) 03/21/2020    Review of Glycemic Control Results for Jay Holloway, Jay Holloway (MRN [id number]) as of 05/02/2020 10:42  Ref. Range 05/01/2020 08:06 05/01/2020 13:45 05/01/2020 16:23 05/01/2020 19:50 05/02/2020 08:02  Glucose-Capillary Latest Ref Range: 70 - 99 mg/dL 245 (H) 202 (H) 186 (H) 124 (H) 206 (H)   Diabetes history: DM 2 Outpatient Diabetes medications: Lantus 10 units, Metformin 500 mg bid, Ozempic 1 mg Weekly on wednesdays Current orders for Inpatient glycemic control:  Novolog 0-9 units tid + hs  Inpatient Diabetes Program Recommendations:    -  Consider half of home basal insulin dose, Lantus 5 units.  Thanks, Tama Headings RN, MSN, BC-ADM Inpatient Diabetes Coordinator Team Pager [phone] (8a-5p)

## 2020-05-03 LAB — GLUCOSE, CAPILLARY
Glucose-Capillary: 209 mg/dL — ABNORMAL HIGH (ref 70–99)
Glucose-Capillary: 179 mg/dL — ABNORMAL HIGH (ref 70–99)
Glucose-Capillary: 215 mg/dL — ABNORMAL HIGH (ref 70–99)
Glucose-Capillary: 147 mg/dL — ABNORMAL HIGH (ref 70–99)

## 2020-05-03 LAB — CBC
HCT: 25.5 % — ABNORMAL LOW (ref 39.0–52.0)
Hemoglobin: 8.1 g/dL — ABNORMAL LOW (ref 13.0–17.0)
MCH: 29 pg (ref 26.0–34.0)
MCHC: 31.8 g/dL (ref 30.0–36.0)
MCV: 91.4 fL (ref 80.0–100.0)
Platelets: 125 10*3/uL — ABNORMAL LOW (ref 150–400)
RBC: 2.79 MIL/uL — ABNORMAL LOW (ref 4.22–5.81)
RDW: 15.2 % (ref 11.5–15.5)
WBC: 4.8 10*3/uL (ref 4.0–10.5)
nRBC: 0 % (ref 0.0–0.2)

## 2020-05-03 LAB — RETICULOCYTES
Immature Retic Fract: 13.6 % (ref 2.3–15.9)
RBC.: 2.8 MIL/uL — ABNORMAL LOW (ref 4.22–5.81)
Retic Count, Absolute: 52.9 10*3/uL (ref 19.0–186.0)
Retic Ct Pct: 1.9 % (ref 0.4–3.1)

## 2020-05-03 LAB — RENAL FUNCTION PANEL
Albumin: 2.3 g/dL — ABNORMAL LOW (ref 3.5–5.0)
Anion gap: 8 (ref 5–15)
BUN: 20 mg/dL (ref 8–23)
CO2: 33 mmol/L — ABNORMAL HIGH (ref 22–32)
Calcium: 7.9 mg/dL — ABNORMAL LOW (ref 8.9–10.3)
Chloride: 101 mmol/L (ref 98–111)
Creatinine, Ser: 0.82 mg/dL (ref 0.61–1.24)
GFR calc non Af Amer: >60 mL/min (ref >60)
Glucose, Bld: 206 mg/dL — ABNORMAL HIGH (ref 70–99)
Phosphorus: 1.4 mg/dL — ABNORMAL LOW (ref 2.5–4.6)
Potassium: 4.3 mmol/L (ref 3.5–5.1)
Sodium: 142 mmol/L (ref 135–145)

## 2020-05-03 LAB — CULTURE, BLOOD (ROUTINE X 2)
Culture: NO GROWTH
Culture: NO GROWTH
Special Requests: ADEQUATE

## 2020-05-03 LAB — IRON AND TIBC
Iron: 52 ug/dL (ref 45–182)
Saturation Ratios: 33 % (ref 17.9–39.5)
TIBC: 158 ug/dL — ABNORMAL LOW (ref 250–450)
UIBC: 106 ug/dL

## 2020-05-03 LAB — VITAMIN B12: Vitamin B-12: 522 pg/mL (ref 180–914)

## 2020-05-03 LAB — HEPARIN LEVEL (UNFRACTIONATED)
Heparin Unfractionated: 0.27 IU/mL — ABNORMAL LOW (ref 0.30–0.70)
Heparin Unfractionated: 0.27 IU/mL — ABNORMAL LOW (ref 0.30–0.70)

## 2020-05-03 LAB — HEMOGLOBIN A1C
Hgb A1c MFr Bld: 7.8 % — ABNORMAL HIGH (ref 4.8–5.6)
Mean Plasma Glucose: 177.16 mg/dL

## 2020-05-03 LAB — FERRITIN: Ferritin: 253 ng/mL (ref 24–336)

## 2020-05-03 LAB — FOLATE: Folate: 11 ng/mL (ref >5.9)

## 2020-05-03 LAB — MAGNESIUM: Magnesium: 1.9 mg/dL (ref 1.7–2.4)

## 2020-05-03 MED ORDER — TRAMADOL HCL 50 MG PO TABS
50.0000 mg | ORAL_TABLET | Freq: Two times a day (BID) | ORAL | Status: DC | PRN
  Administered 2020-05-03 – 2020-05-05 (×3): 50 mg via ORAL
  Filled 2020-05-03 (×3): qty 1

## 2020-05-03 MED ORDER — APIXABAN 5 MG PO TABS: 5.0000 mg | ORAL_TABLET | Freq: Two times a day (BID) | ORAL | Status: DC

## 2020-05-03 MED ORDER — APIXABAN 5 MG PO TABS
10.0000 mg | ORAL_TABLET | Freq: Two times a day (BID) | ORAL | Status: DC
  Administered 2020-05-03 – 2020-05-05 (×4): 10 mg via ORAL
  Filled 2020-05-03 (×4): qty 2

## 2020-05-03 MED ORDER — HEPARIN (PORCINE) 25000 UT/250ML-% IV SOLN
1150.0000 [IU]/h | INTRAVENOUS | Status: DC
  Filled 2020-05-03: qty 250

## 2020-05-03 MED ORDER — HEPARIN (PORCINE) 25000 UT/250ML-% IV SOLN
1300.0000 [IU]/h | INTRAVENOUS | Status: AC
  Filled 2020-05-03: qty 250

## 2020-05-03 MED ORDER — TRAMADOL HCL 50 MG PO TABS
50.0000 mg | ORAL_TABLET | Freq: Two times a day (BID) | ORAL | Status: AC | PRN
  Administered 2020-05-03: 50 mg via ORAL
  Filled 2020-05-03: qty 1

## 2020-05-03 NOTE — Progress Notes (Signed)
PROGRESS NOTE    Jay Holloway  DVV:[id number] DOB: 05-28-1946 DOA: 04/28/2020 PCP: [name], MD    Brief Narrative:  74 year old male with history of dementia, prostate cancer, DM-2, COPD?  CHF, HTN, GERD and recent hospitalization from 9/17-9/22 with generalized weakness, mild DKA, respiratory failure and encephalopathy brought to ED from SNF with altered mental status, hypoxemia and generalized weakness, and admitted for acute hypoxic hypercapnic respiratory failure, pneumonia, AKI and acute metabolic encephalopathy.  Per EMS, hypoxic to 37% on RA.  Assessment & Plan:   Principal Problem:   Acute respiratory failure with hypoxia and hypercarbia (HCC) Active Problems:   Type 2 diabetes mellitus with hyperglycemia, with long-term current use of insulin (HCC)   Essential hypertension   AKI (acute kidney injury) (HCC)   Hyperkalemia   GERD without esophagitis   Acute metabolic encephalopathy   Pressure injury of sacral region, stage 2 (HCC)   Hypomagnesemia   Mixed diabetic hyperlipidemia associated with type 2 diabetes mellitus (HCC)  Acute respiratory failure with hypoxia and hypercapnia-might have undiagnosed sleep apnea or COPD.  CTA chest with LLL pneumonia and  small LLL PE.  He has more mobile right femoral vein DVT.  Respiratory failure improved.  Currently on 1 L. -Continued on IV heparin for PE and DVT -Evaluated by SLP, noted. Will transition to eliquis -Continue IV ceftriaxone and azithromycin for LLL pneumonia -Scheduled DuoNeb every 6 hours while awake  Acute LLL PE/acute RLE DVT: LE US revealed acute DVT with mobile thrombus in right common femoral vein, and right posterior tibial vein.  CTA showed acute small LLL PE -Was on heparin gtt, will transition to eliquis  Severe sepsis due to LLL pneumonia/possible Serratia marcescens UTI: POA.  Met criteria on admission with leukocytosis, tachypnea and signs of endorgan damage as evidenced by encephalopathy and AKI.   Concern for gram-negative pneumonia due to aspiration.  Noted on CTA chest.  Urine culture with Serratia marcescens sensitive to ceftriaxone.  Blood cultures NGTD. -completed IV ceftriaxone and azithromycin, now on bactrim x two more days to complete tx for UTI.  Acute metabolic encephalopathy: Likely due to hypercapnia.  Also some element of delirium.  Improved.  Oriented to self, place, month and year -Treat treatable causes -Avoid sedating medications -Reorientation and delirium precautions -Continue with IV Haldol as needed for agitation  Dysphagia: -Appreciate input by SLP who called pt's facility -Patient noted to have progressive worsening of dysphagia but despite knowing the risks, still desires to eat -Appreciate assistance by Palliative Care  Mildly elevated troponin: High-sensitivity troponin 61.  EKG with sinus tachycardia, RBBB and LAFB, unchanged from baseline.  Elevated troponin likely demand ischemia from respiratory failure.   Acute kidney injury/mild azotemia: Baseline Cr 0.9-1.0> 1.99 (admit)> 2.12> 0.89.  Likely prerenal.  He was also on Lasix -Continue gentle IV fluid -Avoid nephrotoxic meds for now  DM-2 with hyperglycemia:  -Continue Lantus 5 units twice daily -Hemoglobin A1c 7.8  Essential hypertension: BP stable at this time -Decrease IV fluid  Normocytic anemia/thrombocytopenia:  -Hgb trends are trending down -Iron level within normal limit  Mild elevated LFT: Likely ischemic.  Resolved.  Hyperkalemia: Corrected  Hypomagnesemia -replaced  Metabolic alkalosis: Likely compensation for respiratory acidosis.  Leukocytosis with bandemia: Resolved.  DNR/DNI -Appreciate input by Palliative Care -Cont with PO intake with pt understanding risk for potential aspiration   DVT prophylaxis: Eliquis Code Status: DNR Family Communication: Pt in room, family not at bedside  Status is: Inpatient  Remains inpatient appropriate because:Unsafe  d/c plan and Inpatient level of care appropriate due to severity of illness   Dispo: The patient is from: SNF              Anticipated d/c is to: SNF              Anticipated d/c date is: 2 days              Patient currently is not medically stable to d/c.       Consultants:   Palliative Care  Procedures:     Antimicrobials: Anti-infectives (From admission, onward)   Start     Dose/Rate Route Frequency Ordered Stop   05/03/20 1000  sulfamethoxazole-trimethoprim (BACTRIM DS) 800-160 MG per tablet 1 tablet        1 tablet Oral Every 12 hours 05/02/20 1032 05/05/20 0959   04/28/20 2359  azithromycin (ZITHROMAX) 500 mg in sodium chloride 0.9 % 250 mL IVPB        500 mg 250 mL/hr over 60 Minutes Intravenous Every 24 hours 04/28/20 2344 05/03/20 0112   04/28/20 2230  cefTRIAXone (ROCEPHIN) 1 g in sodium chloride 0.9 % 100 mL IVPB        1 g 200 mL/hr over 30 Minutes Intravenous Every 24 hours 04/28/20 2200 05/02/20 2230       Subjective: Wants to eat and return to SNF  Objective: Vitals:   05/02/20 2023 05/02/20 2341 05/03/20 0418 05/03/20 1406  BP: (!) 109/58 115/69 112/63 136/79  Pulse: 99 93 93 94  Resp: $Remo'14 16 15   'MYFJk$ Temp: 98 F (36.7 C) 98.5 F (36.9 C) 98.3 F (36.8 C) 97.8 F (36.6 C)  TempSrc: Oral Oral Oral Oral  SpO2: 97% 98% 93% 96%  Weight:      Height:        Intake/Output Summary (Last 24 hours) at 05/03/2020 1803 Last data filed at 05/03/2020 1411 Gross per 24 hour  Intake 1775.69 ml  Output 450 ml  Net 1325.69 ml   Filed Weights   04/28/20 1143  Weight: 96.4 kg    Examination:  General exam: Appears calm and comfortable  Respiratory system: Clear to auscultation. Respiratory effort normal. Cardiovascular system: S1 & S2 heard, Regular Gastrointestinal system: Abdomen is nondistended, soft and nontender. No organomegaly or masses felt. Normal bowel sounds heard. Central nervous system: Alert and oriented. No focal neurological  deficits. Extremities: Symmetric 5 x 5 power. Skin: No rashes, lesions  Psychiatry: Judgement and insight appear normal. Mood & affect appropriate.   Data Reviewed: I have personally reviewed following labs and imaging studies  CBC: Recent Labs  Lab 04/28/20 1215 04/28/20 1333 04/29/20 0505 04/30/20 0226 05/01/20 0055 05/02/20 0404 05/03/20 0526  WBC 12.9*   < > 13.2* 7.2 8.6 7.6 4.8  NEUTROABS 8.3*  --  10.4*  --   --   --   --   HGB 13.4   < > 11.5* 10.6* 10.0* 9.6* 8.1*  HCT 44.1   < > 38.9* 33.7* 32.5* 32.0* 25.5*  MCV 93.6   < > 97.5 92.1 94.5 95.5 91.4  PLT 221   < > 152 126* 114* 109* 125*   < > = values in this interval not displayed.   Basic Metabolic Panel: Recent Labs  Lab 04/28/20 2134 04/28/20 2134 04/29/20 0910 04/30/20 0226 05/01/20 0055 05/02/20 0404 05/03/20 0526  NA 138   < > 139 139  140 140 143 142  K 5.1   < > 5.0 4.8  4.8 4.6 4.9 4.3  CL 90*   < > 93* 94*  94* 97* 98 101  CO2 38*   < > 37* 34*  35* 33* 34* 33*  GLUCOSE 248*   < > 100* 73  71 209* 184* 206*  BUN 26*   < > 27* 26*  27* 25* 24* 20  CREATININE 2.12*   < > 1.73* 1.45*  1.44* 1.05 0.89 0.82  CALCIUM 9.0   < > 8.6* 8.1*  8.3* 8.1* 8.1* 7.9*  MG  --   --  1.7 1.5* 2.3 2.2 1.9  PHOS 7.1*  --   --  3.9 2.2* 2.4* 1.4*   < > = values in this interval not displayed.   GFR: Estimated Creatinine Clearance: 96.6 mL/min (by C-G formula based on SCr of 0.82 mg/dL). Liver Function Tests: Recent Labs  Lab 04/28/20 1215 04/28/20 2134 04/29/20 0910 04/30/20 0226 05/01/20 0055 05/02/20 0404 05/03/20 0526  AST 48*  --  47* 39  --   --   --   ALT 51*  --  45* 39  --   --   --   ALKPHOS 100  --  83 79  --   --   --   BILITOT 0.9  --  0.7 0.8  --   --   --   PROT 7.2  --  6.1* 5.2*  --   --   --   ALBUMIN 3.9   < > 3.1* 2.7*  2.7* 2.5* 2.5* 2.3*   < > = values in this interval not displayed.   No results for input(s): LIPASE, AMYLASE in the last 168 hours. Recent Labs  Lab  04/28/20 1236  AMMONIA 12   Coagulation Profile: Recent Labs  Lab 04/28/20 2134  INR 0.9   Cardiac Enzymes: No results for input(s): CKTOTAL, CKMB, CKMBINDEX, TROPONINI in the last 168 hours. BNP (last 3 results) No results for input(s): PROBNP in the last 8760 hours. HbA1C: Recent Labs    05/03/20 0526  HGBA1C 7.8*   CBG: Recent Labs  Lab 05/02/20 1631 05/02/20 2122 05/03/20 0801 05/03/20 1219 05/03/20 1707  GLUCAP 129* 171* 209* 179* 215*   Lipid Profile: No results for input(s): CHOL, HDL, LDLCALC, TRIG, CHOLHDL, LDLDIRECT in the last 72 hours. Thyroid Function Tests: No results for input(s): TSH, T4TOTAL, FREET4, T3FREE, THYROIDAB in the last 72 hours. Anemia Panel: Recent Labs    05/03/20 0526  VITAMINB12 522  FOLATE 11.0  FERRITIN 253  TIBC 158*  IRON 52  RETICCTPCT 1.9   Sepsis Labs: Recent Labs  Lab 04/28/20 1235 04/28/20 1435  LATICACIDVEN 1.8 1.4    Recent Results (from the past 240 hour(s))  Blood culture (routine x 2)     Status: None   Collection Time: 04/28/20 12:30 PM   Specimen: BLOOD  Result Value Ref Range Status   Specimen Description   Final    BLOOD RIGHT ARM UPPER Performed at [facility] [address], [place], [place] [address]    Special Requests   Final    BOTTLES DRAWN AEROBIC ONLY Blood Culture results may not be optimal due to an inadequate volume of blood received in culture bottles Performed at [facility] [address] [place], [place] [address]    Culture   Final    NO GROWTH 5 DAYS Performed at [facility], [place] [address] [place],  [address]    Report Status 05/03/2020 FINAL  Final  Blood culture (routine x 2)     Status: None   Collection Time: 04/28/20 12:41 PM   Specimen: BLOOD  Result Value Ref Range Status   Specimen Description   Final    BLOOD LEFT ARM UPPER Performed at [facility] [address], [place], [place]  [address]    Special Requests   Final    BOTTLES DRAWN AEROBIC AND ANAEROBIC Blood Culture adequate volume Performed at [facility] [address], [place], [place] [address]    Culture   Final    NO GROWTH 5 DAYS Performed at [facility], [place] [address], [place], [place] [address]    Report Status 05/03/2020 FINAL  Final  Respiratory Panel by RT PCR (Flu A&B, Covid) - Nasopharyngeal Swab     Status: None   Collection Time: 04/28/20  1:21 PM   Specimen: Nasopharyngeal Swab  Result Value Ref Range Status   SARS Coronavirus 2 by RT PCR NEGATIVE NEGATIVE Final    Comment: (NOTE) SARS-CoV-2 target nucleic acids are NOT DETECTED.  The SARS-CoV-2 RNA is generally detectable in upper respiratoy specimens during the acute phase of infection. The lowest concentration of SARS-CoV-2 viral copies this assay can detect is 131 copies/mL. A negative result does not preclude SARS-Cov-2 infection and should not be used as the sole basis for treatment or other patient management decisions. A negative result may occur with  improper specimen collection/handling, submission of specimen other than nasopharyngeal swab, presence of viral mutation(s) within the areas targeted by this assay, and inadequate number of viral copies (<131 copies/mL). A negative result must be combined with clinical observations, patient history, and epidemiological information. The expected result is Negative.  Fact Sheet for Patients:  PinkCheek.be  Fact Sheet for Healthcare Providers:  GravelBags.it  This test is no t yet approved or cleared by the [place] FDA and  has been authorized for detection and/or diagnosis of SARS-CoV-2 by FDA under an Emergency Use Authorization (EUA). This EUA will remain  in effect (meaning this test can be used) for the duration of the COVID-19 declaration under Section 564(b)(1) of the Act, 21  U.S.C. section 360bbb-3(b)(1), unless the authorization is terminated or revoked sooner.     Influenza A by PCR NEGATIVE NEGATIVE Final   Influenza B by PCR NEGATIVE NEGATIVE Final    Comment: (NOTE) The Xpert Xpress SARS-CoV-2/FLU/RSV assay is intended as an aid in  the diagnosis of influenza from Nasopharyngeal swab specimens and  should not be used as a sole basis for treatment. Nasal washings and  aspirates are unacceptable for Xpert Xpress SARS-CoV-2/FLU/RSV  testing.  Fact Sheet for Patients: PinkCheek.be  Fact Sheet for Healthcare Providers: GravelBags.it  This test is not yet approved or cleared by the [place] FDA and  has been authorized for detection and/or diagnosis of SARS-CoV-2 by  FDA under an Emergency Use Authorization (EUA). This EUA will remain  in effect (meaning this test can be used) for the duration of the  Covid-19 declaration under Section 564(b)(1) of the Act, 21  U.S.C. section 360bbb-3(b)(1), unless the authorization is  terminated or revoked. Performed at [facility], [place] [address] [place],  [address]   Culture, Urine     Status: Abnormal   Collection Time: 04/28/20  8:16 PM   Specimen: Urine, Catheterized  Result Value Ref Range Status   Specimen Description   Final    URINE, CATHETERIZED Performed at [facility]  [facility], [place] [address] [place], [place] [address]    Special Requests   Final    NONE Performed at [facility], [place] [address] [place], [place] [address]    Culture >=100,000 COLONIES/mL SERRATIA MARCESCENS (A)  Final   Report Status 05/01/2020 FINAL  Final   Organism ID, Bacteria SERRATIA MARCESCENS (A)  Final      Susceptibility   Serratia marcescens - MIC*    CEFAZOLIN >=64 RESISTANT Resistant     CEFTRIAXONE <=0.25 SENSITIVE Sensitive     CIPROFLOXACIN <=0.25 SENSITIVE Sensitive     GENTAMICIN <=1  SENSITIVE Sensitive     NITROFURANTOIN 128 RESISTANT Resistant     TRIMETH/SULFA <=20 SENSITIVE Sensitive     * >=100,000 COLONIES/mL SERRATIA MARCESCENS  MRSA PCR Screening     Status: None   Collection Time: 04/30/20  8:21 PM   Specimen: Nasal Mucosa; Nasopharyngeal  Result Value Ref Range Status   MRSA by PCR NEGATIVE NEGATIVE Final    Comment:        The GeneXpert MRSA Assay (FDA approved for NASAL specimens only), is one component of a comprehensive MRSA colonization surveillance program. It is not intended to diagnose MRSA infection nor to guide or monitor treatment for MRSA infections. Performed at [facility], [place] [address] [place], [place] [address]      Radiology Studies: No results found.  Scheduled Meds: . Chlorhexidine Gluconate Cloth  6 each Topical Daily  . collagenase   Topical Daily  . insulin aspart  0-5 Units Subcutaneous QHS  . insulin aspart  0-9 Units Subcutaneous TID WC  . ipratropium-albuterol  3 mL Nebulization BID  . mouth rinse  15 mL Mouth Rinse BID  . pantoprazole (PROTONIX) IV  40 mg Intravenous Q12H  . sodium chloride flush  10-40 mL Intracatheter Q12H  . sulfamethoxazole-trimethoprim  1 tablet Oral Q12H   Continuous Infusions: . dextrose 5 % and 0.9% NaCl 75 mL/hr at 05/02/20 1200  . heparin       LOS: 5 days   Marylu Lund, MD [facility] Pager On Amion  If 7PM-7AM, please contact night-coverage 05/03/2020, 6:03 PM

## 2020-05-03 NOTE — Progress Notes (Signed)
Daily Progress Note   Patient Name: Jay Holloway       Date: 05/03/2020 DOB: 06/17/1946  Age: 74 y.o. MRN#: [id number] Attending Physician: Donne Hazel, MD Primary Care Physician: [name], MD Admit Date: 04/28/2020  Reason for Consultation/Follow-up: Establishing goals of care  Subjective: I saw and examined Jay Holloway.  He was awake and alert and able to fully participate in conversation.  We discussed clinical course as well as wishes moving forward in regard to advanced directives.  Concepts specific to feeding tube, aspiration PNA, and long term goals discussed.  Values and goals of care important to patient and family were attempted to be elicited.  Questions and concerns addressed.   PMT will continue to support holistically.  See below.   Length of Stay: 5  Current Medications: Scheduled Meds:  . apixaban  10 mg Oral BID   Followed by  . [START ON 05/10/2020] apixaban  5 mg Oral BID  . Chlorhexidine Gluconate Cloth  6 each Topical Daily  . collagenase   Topical Daily  . insulin aspart  0-5 Units Subcutaneous QHS  . insulin aspart  0-9 Units Subcutaneous TID WC  . mouth rinse  15 mL Mouth Rinse BID  . pantoprazole (PROTONIX) IV  40 mg Intravenous Q12H  . sodium chloride flush  10-40 mL Intracatheter Q12H  . sulfamethoxazole-trimethoprim  1 tablet Oral Q12H    Continuous Infusions: . dextrose 5 % and 0.9% NaCl 75 mL/hr at 05/03/20 1929    PRN Meds: acetaminophen **OR** [DISCONTINUED] acetaminophen, albuterol, haloperidol lactate, hydrALAZINE, ondansetron **OR** ondansetron (ZOFRAN) IV, sodium chloride flush, traMADol  Physical Exam       General: Alert, awake, in no acute distress. Chronically ill-appearing. HEENT: No bruits, no goiter, no JVD Heart:  Tachycardic. No murmur appreciated. Lungs: Decreased air movement, scattered coarse Abdomen: Soft, nontender, nondistended, positive bowel sounds.  Ext: No significant edema Skin: Warm and dry Neuro: Grossly intact, nonfocal.  Vital Signs: BP 118/76   Pulse 98   Temp 98.4 F (36.9 C) (Oral)   Resp 15   Ht 6' (1.829 m)   Wt 96.4 kg   SpO2 96%   BMI 28.82 kg/m  SpO2: SpO2: 96 % O2 Device: O2 Device: Nasal Cannula O2 Flow Rate: O2 Flow Rate (L/min): 1 L/min  Intake/output summary:  Intake/Output Summary (Last 24 hours) at 05/03/2020 2200 Last data filed at 05/03/2020 1912 Gross per 24 hour  Intake 1775.69 ml  Output 600 ml  Net 1175.69 ml   LBM: Last BM Date: 05/01/20 Baseline Weight: Weight: 96.4 kg Most recent weight: Weight: 96.4 kg       Palliative Assessment/Data: 30%     Patient Active Problem List   Diagnosis Date Noted  . Hypomagnesemia 04/28/2020  . Mixed diabetic hyperlipidemia associated with type 2 diabetes mellitus ([place]) 04/28/2020  . Pressure injury of sacral region, stage 2 ([place]) 04/19/2020  . Acute diastolic CHF (congestive heart failure) ([place]) 04/19/2020  . Acute respiratory failure with hypoxia and hypercarbia ([place]) 04/17/2020  . Hypernatremia 04/17/2020  . Dysphagia 04/17/2020  . Acute metabolic encephalopathy 99/35/7017  . CKD (chronic kidney disease), stage II 04/17/2020  . Hyperkalemia 04/14/2020  . GERD without esophagitis 04/14/2020  . Slurred speech 04/14/2020  . Generalized weakness 04/14/2020  . AKI (acute kidney injury) ([place]) 03/21/2020  . Prostate cancer ([place])   . Essential hypertension   . Arterial hypotension   . Renal failure   . Type 2 diabetes mellitus with hyperglycemia, with long-term current use of insulin ([place])   . Weakness   . DKA (diabetic ketoacidoses) 11/05/2014    Palliative Care Assessment & Plan   Patient Profile:  74 year old gentleman brought in from nursing facility.  Has past medical history of diabetes  hypertension diastolic heart failure chronic kidney disease prostate cancer.  He was hospitalized towards the end of September for possible DKA.  He was discharged to skilled nursing facility.  He has been admitted with hypercapnic respiratory failure.  Off-and-on he has required BiPAP.  Off-and-on he has had encephalopathy.  PCCM has also been following.  Palliative consultation for goals of care discussions has been requested  Assessment: Found to have acute pulmonary embolism and pneumonia on CTA Time-limited trial of current interventions to see if we can make any progress towards meaningful recovery are being done.    Recommendations/Plan: Jay Holloway is clear that he does not desire feeding tube moving forward.  We discussed concern for aspiration, and he reports understanding risk and that this is something that he accepts. He would like to name his long term friend, Holley Bouche as his HCPOA.  He asked me to call and discuss associated risks with her.  I called and left a VM requesting a return call.  I placed referral to spiritual care to complete HCPOA paperwork. I called and spoke with his nephew regarding his clinical course and concern for his ability to swallow safely moving forward.  He reports that he understands that Jay Holloway desires to eat and drink despite risk and does not want feeding tube.  He supports any decisions that the patient wants to make. Await return call from patient's friend per his request.  Ideally, I would prefer that HCPOA paperwork be completed prior to starting diet in case he immediately aspirates.  Will discuss best diet to start with SLP and Dr. Wyline Copas.   Code Status:    Code Status Orders  (From admission, onward)         Start     Ordered   04/28/20 1938  Do not attempt resuscitation (DNR)  Continuous       Question Answer Comment  In the event of cardiac or respiratory ARREST Do not call a "code blue"   In the event of cardiac or respiratory  ARREST Do not perform Intubation,  CPR, defibrillation or ACLS   In the event of cardiac or respiratory ARREST Use medication by any route, position, wound care, and other measures to relive pain and suffering. May use oxygen, suction and manual treatment of airway obstruction as needed for comfort.      04/28/20 1937        Code Status History    Date Active Date Inactive Code Status Order ID Comments User Context   04/28/2020 1847 04/28/2020 1937 DNR [id number]  Vernelle Emerald, MD ED   04/14/2020 1839 04/19/2020 2255 DNR [id number]  Mckinley Jewel, MD ED   04/14/2020 1836 04/14/2020 1839 DNR [id number]  Mckinley Jewel, MD ED   03/21/2020 1559 03/25/2020 0542 Full Code [id number]  Karmen Bongo, MD ED   11/05/2014 1947 11/07/2014 1106 Full Code [id number]  Crecencio Mc, MD Inpatient   11/05/2014 1942 11/05/2014 1947 Full Code [id number]  Crecencio Mc, MD ED   Advance Care Planning Activity    Advance Directive Documentation     Most Recent Value  Type of Advance Directive Out of facility DNR (pink MOST or yellow form)  Pre-existing out of facility DNR order (yellow form or pink MOST form) Pink MOST/Yellow Form most recent copy in chart - Physician notified to receive inpatient order  "MOST" Form in Place? --      Prognosis:  guarded   Discharge Planning: Back to [facility] with hospice versus residential hospice, depending on hospital course, MBS results, PO intake.  Care plan was discussed with IDT  Thank you for allowing the Palliative Medicine Team to assist in the care of this patient.   Total Time 40 Prolonged Time Billed  no    Greater than 50%  of this time was spent counseling and coordinating care related to the above assessment and plan.  Micheline Rough, MD  Please contact Palliative Medicine Team phone at [phone] for questions and concerns.

## 2020-05-03 NOTE — Discharge Instructions (Signed)
Information on my medicine - ELIQUIS (apixaban)  This medication education was reviewed with me or my healthcare representative as part of my discharge preparation.   Why was Eliquis prescribed for you? Eliquis was prescribed to treat blood clots that may have been found in the veins of your legs (deep vein thrombosis) or in your lungs (pulmonary embolism) and to reduce the risk of them occurring again.  What do You need to know about Eliquis ? The starting dose is 10 mg (two 5 mg tablets) taken TWICE daily for the FIRST SEVEN (7) DAYS, then on 05/10/20 the dose is reduced to ONE 5 mg tablet taken TWICE daily.  Eliquis may be taken with or without food.   Try to take the dose about the same time in the morning and in the evening. If you have difficulty swallowing the tablet whole please discuss with your pharmacist how to take the medication safely.  Take Eliquis exactly as prescribed and DO NOT stop taking Eliquis without talking to the doctor who prescribed the medication.  Stopping may increase your risk of developing a new blood clot.  Refill your prescription before you run out.  After discharge, you should have regular check-up appointments with your healthcare provider that is prescribing your Eliquis.    What do you do if you miss a dose? If a dose of ELIQUIS is not taken at the scheduled time, take it as soon as possible on the same day and twice-daily administration should be resumed. The dose should not be doubled to make up for a missed dose.  Important Safety Information A possible side effect of Eliquis is bleeding. You should call your healthcare provider right away if you experience any of the following: ? Bleeding from an injury or your nose that does not stop. ? Unusual colored urine (red or dark brown) or unusual colored stools (red or black). ? Unusual bruising for unknown reasons. ? A serious fall or if you hit your head (even if there is no bleeding).  Some  medicines may interact with Eliquis and might increase your risk of bleeding or clotting while on Eliquis. To help avoid this, consult your healthcare provider or pharmacist prior to using any new prescription or non-prescription medications, including herbals, vitamins, non-steroidal anti-inflammatory drugs (NSAIDs) and supplements.  This website has more information on Eliquis (apixaban): http://www.eliquis.com/eliquis/home

## 2020-05-03 NOTE — Care Management Important Message (Signed)
Important Message  Patient Details IM Letter given to the Patient Name: Jay Holloway MRN: [id number] Date of Birth: [birth year]   Medicare Important Message Given:  Yes     Kerin Salen 05/03/2020, 10:04 AM

## 2020-05-03 NOTE — Progress Notes (Addendum)
Pharmacy: Re- heparin  Patient is a 74 y.o M admitted with acute respiratory failure and altered mentation and was found to have a femoral thrombus and acute PE. He's current on heparin drip for VTE.  - heparin level is sub-therapeutic at 0.27 - Per pt's RN, no issues with IV line and rate is currently running at 1150 units/hr  Goal of Therapy:  Heparin level 0.3-0.7 units/ml Monitor platelets by anticoagulation protocol: Yes  Plan: - increase heparin drip to 1300 units/hr - check 8 hr heparin level - monitor for s/sx bleeding   Dia Sitter, PharmD, BCPS 05/03/2020 5:55 PM  ________________________________________ Adden: Pharmacy is consulted to transition patient to Eliquis - d/c heparin drip - start Eliquis 10mg  bid x7 days, then 5mg  bid  Dia Sitter, PharmD, BCPS 05/03/2020 6:18 PM

## 2020-05-03 NOTE — Progress Notes (Signed)
ANTICOAGULATION CONSULT NOTE  Pharmacy Consult for Heparin drip Indication: mobile left femoral thrombus, small PE  No Known Allergies  Patient Measurements: Height: 6' (182.9 cm) Weight: 96.4 kg (212 lb 8.4 oz) IBW/kg (Calculated) : 77.6  Vital Signs: Temp: 98.3 F (36.8 C) (10/06 0418) Temp Source: Oral (10/06 0418) BP: 112/63 (10/06 0418) Pulse Rate: 93 (10/06 0418)  Labs: Recent Labs    05/01/20 0055 05/01/20 0055 05/01/20 1207 05/01/20 2332 05/02/20 0404 05/02/20 0847 05/03/20 0526  HGB 10.0*   < >  --   --  9.6*  --  8.1*  HCT 32.5*  --   --   --  32.0*  --  25.5*  PLT 114*  --   --   --  109*  --  125*  HEPARINUNFRC 1.30*  --    < > 0.47  --  0.38 0.27*  CREATININE 1.05  --   --   --  0.89  --  0.82   < > = values in this interval not displayed.   Estimated Creatinine Clearance: 96.6 mL/min (by C-G formula based on SCr of 0.82 mg/dL).  Assessment: 74 y/o M admitted with acute respiratory failure and altered mentation found to have a femoral thrombus. Pharmacy consulted to initiate heparin infusion. Last dose of Lovenox was last PM.  10/2 acute R DVT, 10/3 CT small PE  05/03/2020 Heparin level = 0.27, now sub-therapeutic on heparin at 1050 units/hr  Hgb 8.1 low, decreased PLTC 125, trending back up No bleeding per RN    Goal of Therapy:  Heparin level 0.3-0.7 units/ml Monitor platelets by anticoagulation protocol: Yes   Plan:  Increase Heparin to 1150 units/hr Recheck Heparin level 8 hr after rate change follow CBC and heparin level daily ? Long term anticoagulation plans> failed swallow eval 10/4 >chronic dysphagia > SLP following for intake, pt does not want feeding tube, feels short term feeding tube will not change outcomes.  Minda Ditto PharmD 05/03/2020, 7:10 AM

## 2020-05-03 NOTE — Progress Notes (Signed)
Speech Language Pathology Treatment: Dysphagia  Patient Details Name: Jay Holloway MRN: [id number] DOB: 11-20-1945 Today's Date: 05/03/2020 Time: 4332-9518 SLP Time Calculation (min) (ACUTE ONLY): 49 min  Assessment / Plan / Recommendation Clinical Impression  Today pt seen for dysphagia management and pt is fully alert and articulating desire to eat/drink.  His articulation is clear today and he is able to self feed with set up.  Pt continues with indications of dysphagia/aspiration risk c/b multiple swallows - up to 7 swallows with inconsistent throat clearing after nectar liquids.  Subtle cough after thin likely is due to aspiration.  Pt also admits solids are more difficult to clear pharynx than liquids - which was deemed accurate on MBS.    Pt does not appear uncomfortable with intake/dysphagia and again articulates desire to eat regardless of his dysphagia/aspiration. SLP discussed with pt benefits/risks of nectar and thin liquid.  He verbalized that nectar may be a little easier for him to swallow however during po trials, pt only took one bolus of nectar and repeatedly desired thin.  - x5, thus anticipate he will not want thicker drinks.   Further SLP educated him to thin water absorption better for lungs than other consistencies and if comfort level with swallowing is the same, advised he consume thin water.  Moderate verbal cues helpful for pt to comprehend information reviewed with teach back.  He independently recalled SLP session last night and care plans indicating much improved mentation!    Prior to session *with pt permission* SlP phoned SNF SLP Shirlee Limerick to establish baseline.   Shirlee Limerick reports she saw pt on 9/23 for an evaluation and 3 more sessions following. Pt with progressive functional decline during SNF stay until Shirlee Limerick reports she found him unresponsive on 9/30.  BIMS cognitive screen performed at the facility with resulting score of 14/15 - normal cognitive function score, which  may provide support for his decision making re: his health care..    Recommend consider dys3/thin diet for comfort (with known aspiration risk) given slight improvement in function with mitigation strategies.  Pt clearly states desire to eat regardless of aspiration risk.  Ordering a can of thickener may be helpful to use if pt wishes.    Pt informed to aspiration precautions, mitigations including attempting to strengthen cough/"hock" and staying upright 30 min after po.  He reported understanding.    After intake of graham cracker x1, 3/4 pudding cup, 1/2 thick water, 3 ounces thin water - pt did not desire furthter intake - his intake may continue to be marginalized.  SLP spoke to MDs x2 (hospitalist and palliative) regarding pt's function, wishes to eat regardless of dysphagia, chronicity and progression of his dysphagia, deconditioning, etc.    Thanks!  HPI HPI: 74 year old male with past medical history of prostate cancer status post radical prostatectomy, insulin-dependent diabetes mellitus type 2, hypertension, gastroesophageal reflux disease, questionable history of COPD,  questionable history of diastolic ingestive heart failure and recent hospitalization for acute metabolic encephalopathy and acute hypercapnic/hypoxic respiratory failure who presents hospital emergency department from his skilled nursing facility with severe somnolence and hypoxia. Chest CT Left lower lobe opacity suspicious for Bronchopneumonia. Consider aspiration. MBS 03/22/20 flash penetration, osteophytes impinging and narrowing pharyngeal space. Puree/thin recommended. BSE 04/15/20 recommended regular/nectar liquids and ST upgraded to thin liquid 2 days later and Dys 2.   Pt underwent MBS yesterday with findings of severe dysphagia, pharyngeal retention and aspiration.  Pt follow up indicated to assess for improvement in swallowing/determine readiness for  po diet.      SLP Plan  Continue with current plan of care        Recommendations  Diet recommendations: Dysphagia 3 (mechanical soft);Thin liquid;Other(comment) (order thickener to use if pt deems necessary) Liquids provided via: Cup;Straw Medication Administration: Crushed with puree Supervision: Patient able to self feed;Intermittent supervision to cue for compensatory strategies Compensations: Minimize environmental distractions;Small sips/bites;Slow rate;Follow solids with liquid;Multiple dry swallows after each bite/sip Postural Changes and/or Swallow Maneuvers: Seated upright 90 degrees;Upright 30-60 min after meal                Oral Care Recommendations: Oral care BID Follow up Recommendations: Skilled Nursing facility SLP Visit Diagnosis: Dysphagia, oropharyngeal phase (R13.12) Plan: Continue with current plan of care       GO                Macario Golds 05/03/2020, 1:06 PM  Kathleen Lime, MS [place] Office [phone]

## 2020-05-04 LAB — COMPREHENSIVE METABOLIC PANEL
ALT: 25 U/L (ref 0–44)
AST: 20 U/L (ref 15–41)
Albumin: 2.5 g/dL — ABNORMAL LOW (ref 3.5–5.0)
Alkaline Phosphatase: 80 U/L (ref 38–126)
Anion gap: 6 (ref 5–15)
BUN: 18 mg/dL (ref 8–23)
CO2: 33 mmol/L — ABNORMAL HIGH (ref 22–32)
Calcium: 8.3 mg/dL — ABNORMAL LOW (ref 8.9–10.3)
Chloride: 103 mmol/L (ref 98–111)
Creatinine, Ser: 0.77 mg/dL (ref 0.61–1.24)
GFR calc non Af Amer: >60 mL/min (ref >60)
Glucose, Bld: 207 mg/dL — ABNORMAL HIGH (ref 70–99)
Potassium: 4.3 mmol/L (ref 3.5–5.1)
Sodium: 142 mmol/L (ref 135–145)
Total Bilirubin: 0.6 mg/dL (ref 0.3–1.2)
Total Protein: 4.9 g/dL — ABNORMAL LOW (ref 6.5–8.1)

## 2020-05-04 LAB — GLUCOSE, CAPILLARY
Glucose-Capillary: 199 mg/dL — ABNORMAL HIGH (ref 70–99)
Glucose-Capillary: 163 mg/dL — ABNORMAL HIGH (ref 70–99)
Glucose-Capillary: 162 mg/dL — ABNORMAL HIGH (ref 70–99)
Glucose-Capillary: 176 mg/dL — ABNORMAL HIGH (ref 70–99)

## 2020-05-04 LAB — CBC
HCT: 27 % — ABNORMAL LOW (ref 39.0–52.0)
Hemoglobin: 8.5 g/dL — ABNORMAL LOW (ref 13.0–17.0)
MCH: 28.6 pg (ref 26.0–34.0)
MCHC: 31.5 g/dL (ref 30.0–36.0)
MCV: 90.9 fL (ref 80.0–100.0)
Platelets: 133 10*3/uL — ABNORMAL LOW (ref 150–400)
RBC: 2.97 MIL/uL — ABNORMAL LOW (ref 4.22–5.81)
RDW: 15.3 % (ref 11.5–15.5)
WBC: 5.7 10*3/uL (ref 4.0–10.5)
nRBC: 0 % (ref 0.0–0.2)

## 2020-05-04 LAB — SARS CORONAVIRUS 2 BY RT PCR (HOSPITAL ORDER, PERFORMED IN ~~LOC~~ HOSPITAL LAB): SARS Coronavirus 2: NEGATIVE

## 2020-05-04 MED ORDER — PANTOPRAZOLE SODIUM 40 MG PO TBEC
40.0000 mg | DELAYED_RELEASE_TABLET | Freq: Two times a day (BID) | ORAL | Status: DC
  Administered 2020-05-04 – 2020-05-05 (×3): 40 mg via ORAL
  Filled 2020-05-04 (×3): qty 1

## 2020-05-04 NOTE — Progress Notes (Signed)

## 2020-05-04 NOTE — Evaluation (Signed)
Physical Therapy Evaluation Patient Details Name: Jay Holloway MRN: [id number] DOB: Dec 09, 1945 Today's Date: 05/04/2020   History of Present Illness  74 year old male with history of dementia, prostate cancer, DM-2, COPD?  CHF, HTN, GERD and recent hospitalization from 9/17-9/22 with generalized weakness, mild DKA, respiratory failure and encephalopathy brought to ED from SNF with altered mental status, hypoxemia and generalized weakness, and admitted for acute hypoxic hypercapnic respiratory failure, pneumonia, AKI and acute metabolic encephalopathy.  Clinical Impression  Pt admitted with above diagnosis. +2 max assist for supine to sit. Pt unable to stand with +2 total assist. Utilized maximove lift to transfer pt from bed to recliner. Pt reports he was ambulating at SNF prior to this admission and hopes to return to rehab to work on walking.  Pt currently with functional limitations due to the deficits listed below (see PT Problem List). Pt will benefit from skilled PT to increase their independence and safety with mobility to allow discharge to the venue listed below.       Follow Up Recommendations SNF;Supervision/Assistance - 24 hour;Supervision for mobility/OOB    Equipment Recommendations  Other (comment) (TBD next venue)    Recommendations for Other Services       Precautions / Restrictions Precautions Precautions: Fall Restrictions Weight Bearing Restrictions: No      Mobility  Bed Mobility Overal bed mobility: Needs Assistance Bed Mobility: Rolling;Supine to Sit Rolling: Mod assist;Max assist   Supine to sit: Max assist;+2 for physical assistance Sit to supine: Max assist;+2 for physical assistance   General bed mobility comments: max assist to raise trunk supine to sit, assist to roll L and R to place maximove pad  Transfers Overall transfer level: Needs assistance Equipment used: Rolling walker (2 wheeled) Transfers: Lateral/Scoot Transfers;Sit to/from Stand Sit  to Stand: Total assist;+2 physical assistance        Lateral/Scoot Transfers: Mod assist General transfer comment: attempted sit to stand from elevated bed but pt unable to lift buttocks from bed and began to slide off bed so assisted to supine then used maximove for bed to recliner  Ambulation/Gait             General Gait Details: unable  Stairs            Wheelchair Mobility    Modified Rankin (Stroke Patients Only)       Balance Overall balance assessment: Needs assistance Sitting-balance support: Single extremity supported;Feet supported Sitting balance-Leahy Scale: Poor Sitting balance - Comments: posterior bias; can briefly maintain trunk at neutral but frequently required min to mod A for posterior lean Postural control: Posterior lean                                   Pertinent Vitals/Pain Pain Score: 8  Pain Location: R inner upper thigh Pain Descriptors / Indicators: Sore Pain Intervention(s): Limited activity within patient's tolerance;Monitored during session;Premedicated before session    Home Living Family/patient expects to be discharged to:: Skilled nursing facility                      Prior Function           Comments: admitted from SNF where he was walking with RW and was bathing himself seated at SNF, uses a cane and lives alone at baseline     Hand Dominance        Extremity/Trunk Assessment   Upper Extremity Assessment  Upper Extremity Assessment: Defer to OT evaluation    Lower Extremity Assessment Lower Extremity Assessment: RLE deficits/detail;LLE deficits/detail RLE Deficits / Details: knee ext -2/5 limited by pain RLE Sensation: decreased light touch LLE Deficits / Details: knee ext -4/5 LLE Sensation: decreased light touch    Cervical / Trunk Assessment Cervical / Trunk Assessment: Kyphotic  Communication   Communication: No difficulties  Cognition Arousal/Alertness: Awake/alert Behavior  During Therapy: Flat affect Overall Cognitive Status: Within Functional Limits for tasks assessed                                        General Comments General comments (skin integrity, edema, etc.): SaO2 91-94% on room air with activity, HR 109    Exercises General Exercises - Lower Extremity Ankle Circles/Pumps: AROM;Both;10 reps;Supine Long Arc Quad: Left;5 reps;Seated   Assessment/Plan    PT Assessment Patient needs continued PT services  PT Problem List Decreased strength;Decreased activity tolerance;Decreased balance;Decreased knowledge of use of DME;Pain;Decreased mobility       PT Treatment Interventions Gait training;DME instruction;Functional mobility training;Therapeutic activities;Therapeutic exercise;Balance training;Patient/family education;Cognitive remediation    PT Goals (Current goals can be found in the Care Plan section)  Acute Rehab PT Goals Patient Stated Goal: get back to walking PT Goal Formulation: With patient Time For Goal Achievement: 05/18/20 Potential to Achieve Goals: Fair    Frequency Min 2X/week   Barriers to discharge Decreased caregiver support      Co-evaluation               AM-PAC PT "6 Clicks" Mobility  Outcome Measure Help needed turning from your back to your side while in a flat bed without using bedrails?: A Lot Help needed moving from lying on your back to sitting on the side of a flat bed without using bedrails?: A Lot Help needed moving to and from a bed to a chair (including a wheelchair)?: Total Help needed standing up from a chair using your arms (e.g., wheelchair or bedside chair)?: Total Help needed to walk in hospital room?: Total Help needed climbing 3-5 steps with a railing? : Total 6 Click Score: 8    End of Session Equipment Utilized During Treatment: Gait belt Activity Tolerance: Patient tolerated treatment well Patient left: with call bell/phone within reach;in chair;with chair alarm  set;with nursing/sitter in room (bil wrist restraints) Nurse Communication: Mobility status;Need for lift equipment PT Visit Diagnosis: Muscle weakness (generalized) (M62.81);Difficulty in walking, not elsewhere classified (R26.2)    Time: 2563-8937 PT Time Calculation (min) (ACUTE ONLY): 39 min   Charges:   PT Evaluation $PT Eval Moderate Complexity: 1 Mod PT Treatments $Therapeutic Activity: 23-37 mins       Blondell Reveal Kistler PT 05/04/2020  Acute Rehabilitation Services Pager [phone] Office [phone]

## 2020-05-04 NOTE — Progress Notes (Signed)
Patient worked with physical therapy.  Patient moved from bed to chair using lift device. Nasal cannula left off of patient in attempt to wean by physical therapy because patient 02 Saturation checked by physical therapist resulted 91-94% on room air.Shortly after physical therapy patient complained of nausea. RN gave 4mg  of zofran IV per order and also checked patients 02 saturation. Patient 02 saturation  resulted 78-83% on RA. Patient placed on 2L of 02. Patient current 02 sat is 94-96% on 2L. Will continue to monitor.

## 2020-05-04 NOTE — TOC Progression Note (Addendum)
Transition of Care [facility]) - Progression Note    Patient Details  Name: Jay Holloway MRN: [id number] Date of Birth: 1946/04/29  [facility] CM/SW Contact  Lennart Pall, LCSW Phone Number: 05/04/2020, 1:49 PM  Clinical Narrative:     Alerted by MD that pt is medically ready for return to SNF.  Have alerted [employer] who will start insurance auth. Bed will be available tomorrow.  Will need another COVID test - MD aware.    Discussed with pt who is aware and agreeable.  Will alert family as well.       Expected Discharge Plan and Services           Expected Discharge Date:  (unknown)                                     Social Determinants of Health (SDOH) Interventions    Readmission Risk Interventions No flowsheet data found.

## 2020-05-04 NOTE — Progress Notes (Signed)
  Speech Language Pathology Treatment: Dysphagia  Patient Details Name: Jay Holloway MRN: [id number] DOB: 11-19-1945 Today's Date: 05/04/2020 Time: 1536-1600 SLP Time Calculation (min) (ACUTE ONLY): 24 min  Assessment / Plan / Recommendation Clinical Impression  Per chart review, plan is for pt to have po diet with accepted aspiration risks.   He is awake in bed with open mouth posture upon SLP arrival to room.  Pt reports he consumed some food today including beef, potatoes and lemonade.  He denies having difficulties/coughing with intake and is reporting concern about hydration given he is currently saline locked.  SLP provided pt with water- which he was able to self feed- for hydration/comfort.  When drinking thin water sequentially, pt presents with multiple swallows x5-6 with last bolus and immediate throat clearing following by coughing.  Pt's cough was not productive and lasted approx 20 seconds but he denied discomfort with event.    Advised he consider drinking Ensure if produces less coughing and increases comfort during meals.  SLP provided pt with compensation strategies in writing *using teach back and large print* for him to follow.  Pt able to verbalize clinical reasoning for said strategies.  He does report being upset re: his lack of progress with PT as he wants to become stronger.    SLP advised for follow up SLP at SNF for RMST *Respiratory Muscle Strength Training* to strengthen hyolaryngeal elevators and improve pharyngeal contraction to decrease retention and maximize airway protection. Pt agreeable to recommendation.  No SLP follow up indicated as pt is on a diet with compensations and has good understanding of risks, benefits, etc.    HPI HPI: 74 year old male with past medical history of prostate cancer status post radical prostatectomy, insulin-dependent diabetes mellitus type 2, hypertension, gastroesophageal reflux disease, questionable history of COPD,  questionable  history of diastolic ingestive heart failure and recent hospitalization for acute metabolic encephalopathy and acute hypercapnic/hypoxic respiratory failure who presents hospital emergency department from his skilled nursing facility with severe somnolence and hypoxia. Chest CT Left lower lobe opacity suspicious for Bronchopneumonia. Consider aspiration. MBS 03/22/20 flash penetration, osteophytes impinging and narrowing pharyngeal space. Puree/thin recommended. BSE 04/15/20 recommended regular/nectar liquids and ST upgraded to thin liquid 2 days later and Dys 2.   Pt underwent MBS yesterday with findings of severe dysphagia, pharyngeal retention and aspiration.  Pt follow up indicated to assess for improvement in swallowing/determine readiness for po diet.      SLP Plan  All goals met       Recommendations  Diet recommendations: Dysphagia 3 (mechanical soft);Thin liquid Liquids provided via: Cup;Straw Medication Administration: Crushed with puree Supervision: Patient able to self feed;Intermittent supervision to cue for compensatory strategies Compensations: Minimize environmental distractions;Small sips/bites;Slow rate;Follow solids with liquid;Multiple dry swallows after each bite/sip Postural Changes and/or Swallow Maneuvers: Seated upright 90 degrees;Upright 30-60 min after meal                Oral Care Recommendations: Oral care BID Follow up Recommendations: Skilled Nursing facility SLP Visit Diagnosis: Dysphagia, oropharyngeal phase (R13.12) Plan: All goals met       GO               Kathleen Lime, MS [facility] SLP Acute Rehab Services Office [phone]  Jay Holloway 05/04/2020, 4:20 PM

## 2020-05-04 NOTE — Progress Notes (Signed)
Daily Progress Note   Patient Name: Jay Holloway       Date: 05/04/2020 DOB: 03/21/1946  Age: 74 y.o. MRN#: [id number] Attending Physician: Donne Hazel, MD Primary Care Physician: [name], MD Admit Date: 04/28/2020  Reason for Consultation/Follow-up: Establishing goals of care  Subjective: I saw and examined Jay Holloway.    He is awake, alert, and able to fully participate in conversation.  He tells me he is hopeful to get back out of the hospital to skilled facility tomorrow.  We reviewed again care plan moving forward including DNR/DNI and his desire to eat despite risk of aspiration.  He again stated he would not want a feeding tube.  I talked with him about completing a MOST form to outline his goals prior to discharge.  We reviewed form together and he reports being in agreement with plan for DNR, limited additional interventions, IV fluid if indicated, antibiotics if indicated, no feeding tube.  When I asked if he wanted to sign the form he stated that he wanted to wait until he was back to skilled facility because he does not have his glasses and wants to read before he signs anything.  He is in agreement with outpatient palliative care to follow-up with him to complete MOST form.  I do think this would be in his best interest as he did seem clear in his wishes and was certainly able to participate in conversation regarding his goals today.   Length of Stay: 6  Current Medications: Scheduled Meds:  . apixaban  10 mg Oral BID   Followed by  . [START ON 05/10/2020] apixaban  5 mg Oral BID  . Chlorhexidine Gluconate Cloth  6 each Topical Daily  . collagenase   Topical Daily  . insulin aspart  0-5 Units Subcutaneous QHS  . insulin aspart  0-9 Units Subcutaneous TID WC  . mouth  rinse  15 mL Mouth Rinse BID  . pantoprazole  40 mg Oral BID  . sodium chloride flush  10-40 mL Intracatheter Q12H  . sulfamethoxazole-trimethoprim  1 tablet Oral Q12H    Continuous Infusions:   PRN Meds: acetaminophen **OR** [DISCONTINUED] acetaminophen, albuterol, haloperidol lactate, hydrALAZINE, ondansetron **OR** ondansetron (ZOFRAN) IV, sodium chloride flush, traMADol  Physical Exam       General: Alert, awake, in no acute  distress. Chronically ill-appearing. HEENT: No bruits, no goiter, no JVD Heart: Tachycardic. No murmur appreciated. Lungs: Decreased air movement, scattered coarse Abdomen: Soft, nontender, nondistended, positive bowel sounds.  Ext: No significant edema Skin: Warm and dry Neuro: Grossly intact, nonfocal.  Vital Signs: BP 112/83 (BP Location: Left Arm)   Pulse (!) 105   Temp 97.8 F (36.6 C) (Oral)   Resp 17   Ht 6' (1.829 m)   Wt 96.4 kg   SpO2 94%   BMI 28.82 kg/m  SpO2: SpO2: 94 % O2 Device: O2 Device: Nasal Cannula O2 Flow Rate: O2 Flow Rate (L/min): 2 L/min  Intake/output summary:   Intake/Output Summary (Last 24 hours) at 05/04/2020 1753 Last data filed at 05/04/2020 1454 Gross per 24 hour  Intake 250 ml  Output 750 ml  Net -500 ml   LBM: Last BM Date: 05/04/20 Baseline Weight: Weight: 96.4 kg Most recent weight: Weight: 96.4 kg       Palliative Assessment/Data: 30%     Patient Active Problem List   Diagnosis Date Noted  . Hypomagnesemia 04/28/2020  . Mixed diabetic hyperlipidemia associated with type 2 diabetes mellitus ([place]) 04/28/2020  . Pressure injury of sacral region, stage 2 ([place]) 04/19/2020  . Acute diastolic CHF (congestive heart failure) ([place]) 04/19/2020  . Acute respiratory failure with hypoxia and hypercarbia ([place]) 04/17/2020  . Hypernatremia 04/17/2020  . Dysphagia 04/17/2020  . Acute metabolic encephalopathy 16/04/9603  . CKD (chronic kidney disease), stage II 04/17/2020  . Hyperkalemia 04/14/2020  . GERD  without esophagitis 04/14/2020  . Slurred speech 04/14/2020  . Generalized weakness 04/14/2020  . AKI (acute kidney injury) ([place]) 03/21/2020  . Prostate cancer ([place])   . Essential hypertension   . Arterial hypotension   . Renal failure   . Type 2 diabetes mellitus with hyperglycemia, with long-term current use of insulin ([place])   . Weakness   . DKA (diabetic ketoacidoses) 11/05/2014    Palliative Care Assessment & Plan   Patient Profile:  74 year old gentleman brought in from nursing facility.  Has past medical history of diabetes hypertension diastolic heart failure chronic kidney disease prostate cancer.  He was hospitalized towards the end of September for possible DKA.  He was discharged to skilled nursing facility.  He has been admitted with hypercapnic respiratory failure.  Off-and-on he has required BiPAP.  Off-and-on he has had encephalopathy.  PCCM has also been following.  Palliative consultation for goals of care discussions has been requested  Assessment: Found to have acute pulmonary embolism and pneumonia on CTA Time-limited trial of current interventions to see if we can make any progress towards meaningful recovery are being done.    Recommendations/Plan: Jay Holloway was awake, alert, and fully able to participate in conversation.  We reviewed a MOST form discussed outlining care plan that would focus on interventions that are likely to allow him to spend quality time outside of the hospital while having things to eat despite risk for aspiration. He reports being agreeable to completion of MOST form at some point in time, however, he does not have his glasses here at the hospital and wants to be over read form prior to signing.  This certainly seems reasonable to me. Recommend outpatient palliative care to follow on transition back to facility.   Code Status:    Code Status Orders  (From admission, onward)         Start     Ordered   04/28/20 1938  Do not attempt  resuscitation (  DNR)  Continuous       Question Answer Comment  In the event of cardiac or respiratory ARREST Do not call a "code blue"   In the event of cardiac or respiratory ARREST Do not perform Intubation, CPR, defibrillation or ACLS   In the event of cardiac or respiratory ARREST Use medication by any route, position, wound care, and other measures to relive pain and suffering. May use oxygen, suction and manual treatment of airway obstruction as needed for comfort.      04/28/20 1937        Code Status History    Date Active Date Inactive Code Status Order ID Comments User Context   04/28/2020 1847 04/28/2020 1937 DNR [id number]  Vernelle Emerald, MD ED   04/14/2020 1839 04/19/2020 2255 DNR [id number]  Mckinley Jewel, MD ED   04/14/2020 1836 04/14/2020 1839 DNR [id number]  Mckinley Jewel, MD ED   03/21/2020 1559 03/25/2020 0542 Full Code [id number]  Karmen Bongo, MD ED   11/05/2014 1947 11/07/2014 1106 Full Code [id number]  Crecencio Mc, MD Inpatient   11/05/2014 1942 11/05/2014 1947 Full Code [id number]  Crecencio Mc, MD ED   Advance Care Planning Activity    Advance Directive Documentation     Most Recent Value  Type of Advance Directive Out of facility DNR (pink MOST or yellow form)  Pre-existing out of facility DNR order (yellow form or pink MOST form) Pink MOST/Yellow Form most recent copy in chart - Physician notified to receive inpatient order  "MOST" Form in Place? --       Discharge Planning: Back to [facility] health care with recommendation for outpatient palliative follow-up  Care plan was discussed with IDT  Thank you for allowing the Palliative Medicine Team to assist in the care of this patient.   Total Time 40 Prolonged Time Billed  no    Greater than 50%  of this time was spent counseling and coordinating care related to the above assessment and plan.   Micheline Rough, MD  Please contact Palliative Medicine Team phone at [phone] for questions and concerns.

## 2020-05-04 NOTE — Progress Notes (Signed)
PROGRESS NOTE    Jay Holloway  XLK:[id number] DOB: 12/11/1945 DOA: 04/28/2020 PCP: [name], MD    Brief Narrative:  74 year old male with history of dementia, prostate cancer, DM-2, COPD?  CHF, HTN, GERD and recent hospitalization from 9/17-9/22 with generalized weakness, mild DKA, respiratory failure and encephalopathy brought to ED from SNF with altered mental status, hypoxemia and generalized weakness, and admitted for acute hypoxic hypercapnic respiratory failure, pneumonia, AKI and acute metabolic encephalopathy.  Per EMS, hypoxic to 37% on RA.  Assessment & Plan:   Principal Problem:   Acute respiratory failure with hypoxia and hypercarbia (HCC) Active Problems:   Type 2 diabetes mellitus with hyperglycemia, with long-term current use of insulin (HCC)   Essential hypertension   AKI (acute kidney injury) (HCC)   Hyperkalemia   GERD without esophagitis   Acute metabolic encephalopathy   Pressure injury of sacral region, stage 2 (HCC)   Hypomagnesemia   Mixed diabetic hyperlipidemia associated with type 2 diabetes mellitus (HCC)  Acute respiratory failure with hypoxia and hypercapnia-might have undiagnosed sleep apnea or COPD.  CTA chest with LLL pneumonia and  small LLL PE.  He has more mobile right femoral vein DVT.  Respiratory failure improved.  Currently on 2 L. -Now on eliquis -completed ceftriaxone and azithromycin for LLL pneumonia -Continue on DuoNeb every 6 hours while awake  Acute LLL PE/acute RLE DVT: LE US revealed acute DVT with mobile thrombus in right common femoral vein, and right posterior tibial vein.  CTA showed acute small LLL PE -Now on eliquis  Severe sepsis due to LLL pneumonia/possible Serratia marcescens UTI: POA.  Met criteria on admission with leukocytosis, tachypnea and signs of endorgan damage as evidenced by encephalopathy and AKI.  Concern for gram-negative pneumonia due to aspiration.  Noted on CTA chest.  Urine culture with Serratia  marcescens sensitive to ceftriaxone.  Blood cultures NGTD. -completed IV ceftriaxone and azithromycin, now on bactrim for one more day to complete tx for UTI.  Acute metabolic encephalopathy:  - Resolved - Suspect secondary to hypercarbia as well as aspiration pneumonia  Dysphagia: -Appreciate input by SLP who called pt's facility -Patient noted to have progressive worsening of dysphagia but despite knowing the risks, still desires to eat -Appreciate assistance by Palliative Care. Now on dysphagia diet as tolerated  Mildly elevated troponin: High-sensitivity troponin 61.  EKG with sinus tachycardia, RBBB and LAFB, unchanged from baseline.  Elevated troponin likely demand ischemia from respiratory failure.   Acute kidney injury/mild azotemia: Baseline Cr 0.9-1.0> 1.99 (admit)> 2.12> 0.89.  Likely prerenal.  He was also on Lasix -resolved  DM-2 with hyperglycemia:  -Continue Lantus 5 units twice daily -Hemoglobin A1c 7.8  Essential hypertension: BP stable at this time  Normocytic anemia/thrombocytopenia:  -Hgb trends overall stable -Iron level within normal limit  Mild elevated LFT: Likely ischemic.  Resolved.  Hyperkalemia: Corrected and stable  Hypomagnesemia -replaced  Metabolic alkalosis: Likely compensation for respiratory acidosis. Remains stable  Leukocytosis with bandemia: Resolved.  DNR/DNI -Appreciate input by Palliative Care -Cont with PO intake with pt understanding risk for potential aspiration -Pt declined to fill out MOST form today with Palliative Care as he left his glasses at facility and declined to sign form without being able to read it   DVT prophylaxis: Eliquis Code Status: DNR Family Communication: Pt in room, family not at bedside  Status is: Inpatient  Remains inpatient appropriate because:Unsafe d/c plan and Inpatient level of care appropriate due to severity of illness   Dispo: The  patient is from: SNF               Anticipated d/c is to: SNF              Anticipated d/c date is: 1 day              Patient currently is medically stable to d/c. Pending dispo and for COVID test result   Consultants:   Palliative Care  Procedures:     Antimicrobials: Anti-infectives (From admission, onward)   Start     Dose/Rate Route Frequency Ordered Stop   05/03/20 1000  sulfamethoxazole-trimethoprim (BACTRIM DS) 800-160 MG per tablet 1 tablet        1 tablet Oral Every 12 hours 05/02/20 1032 05/05/20 0959   04/28/20 2359  azithromycin (ZITHROMAX) 500 mg in sodium chloride 0.9 % 250 mL IVPB        500 mg 250 mL/hr over 60 Minutes Intravenous Every 24 hours 04/28/20 2344 05/03/20 0112   04/28/20 2230  cefTRIAXone (ROCEPHIN) 1 g in sodium chloride 0.9 % 100 mL IVPB        1 g 200 mL/hr over 30 Minutes Intravenous Every 24 hours 04/28/20 2200 05/02/20 2230      Subjective: Eager to return to facility  Objective: Vitals:   05/04/20 0553 05/04/20 1232 05/04/20 1248 05/04/20 1319  BP: 129/77   112/83  Pulse: 100   (!) 105  Resp: 16   17  Temp: 97.8 F (36.6 C)     TempSrc: Oral     SpO2: 91% 91% 94%   Weight:      Height:        Intake/Output Summary (Last 24 hours) at 05/04/2020 1711 Last data filed at 05/04/2020 1454 Gross per 24 hour  Intake 250 ml  Output 750 ml  Net -500 ml   Filed Weights   04/28/20 1143  Weight: 96.4 kg    Examination: General exam: Awake, laying in bed, in nad Respiratory system: Normal respiratory effort, no wheezing Cardiovascular system: regular rate, s1, s2 Gastrointestinal system: Soft, nondistended, positive BS Central nervous system: CN2-12 grossly intact, strength intact Extremities: Perfused, no clubbing Skin: Normal skin turgor, no notable skin lesions seen Psychiatry: Mood normal // no visual hallucinations   Data Reviewed: I have personally reviewed following labs and imaging studies  CBC: Recent Labs  Lab 04/28/20 1215 04/28/20 1333  04/29/20 0505 04/29/20 0505 04/30/20 0226 05/01/20 0055 05/02/20 0404 05/03/20 0526 05/04/20 0510  WBC 12.9*   < > 13.2*   < > 7.2 8.6 7.6 4.8 5.7  NEUTROABS 8.3*  --  10.4*  --   --   --   --   --   --   HGB 13.4   < > 11.5*   < > 10.6* 10.0* 9.6* 8.1* 8.5*  HCT 44.1   < > 38.9*   < > 33.7* 32.5* 32.0* 25.5* 27.0*  MCV 93.6   < > 97.5   < > 92.1 94.5 95.5 91.4 90.9  PLT 221   < > 152   < > 126* 114* 109* 125* 133*   < > = values in this interval not displayed.   Basic Metabolic Panel: Recent Labs  Lab 04/28/20 2134 04/28/20 2134 04/29/20 0910 04/29/20 0910 04/30/20 0226 05/01/20 0055 05/02/20 0404 05/03/20 0526 05/04/20 0510  NA 138   < > 139   < > 139  140 140 143 142 142  K 5.1   < >  5.0   < > 4.8  4.8 4.6 4.9 4.3 4.3  CL 90*   < > 93*   < > 94*  94* 97* 98 101 103  CO2 38*   < > 37*   < > 34*  35* 33* 34* 33* 33*  GLUCOSE 248*   < > 100*   < > 73  71 209* 184* 206* 207*  BUN 26*   < > 27*   < > 26*  27* 25* 24* 20 18  CREATININE 2.12*   < > 1.73*   < > 1.45*  1.44* 1.05 0.89 0.82 0.77  CALCIUM 9.0   < > 8.6*   < > 8.1*  8.3* 8.1* 8.1* 7.9* 8.3*  MG  --   --  1.7  --  1.5* 2.3 2.2 1.9  --   PHOS 7.1*  --   --   --  3.9 2.2* 2.4* 1.4*  --    < > = values in this interval not displayed.   GFR: Estimated Creatinine Clearance: 99 mL/min (by C-G formula based on SCr of 0.77 mg/dL). Liver Function Tests: Recent Labs  Lab 04/28/20 1215 04/28/20 2134 04/29/20 0910 04/29/20 0910 04/30/20 0226 05/01/20 0055 05/02/20 0404 05/03/20 0526 05/04/20 0510  AST 48*  --  47*  --  39  --   --   --  20  ALT 51*  --  45*  --  39  --   --   --  25  ALKPHOS 100  --  83  --  79  --   --   --  80  BILITOT 0.9  --  0.7  --  0.8  --   --   --  0.6  PROT 7.2  --  6.1*  --  5.2*  --   --   --  4.9*  ALBUMIN 3.9   < > 3.1*   < > 2.7*  2.7* 2.5* 2.5* 2.3* 2.5*   < > = values in this interval not displayed.   No results for input(s): LIPASE, AMYLASE in the last 168  hours. Recent Labs  Lab 04/28/20 1236  AMMONIA 12   Coagulation Profile: Recent Labs  Lab 04/28/20 2134  INR 0.9   Cardiac Enzymes: No results for input(s): CKTOTAL, CKMB, CKMBINDEX, TROPONINI in the last 168 hours. BNP (last 3 results) No results for input(s): PROBNP in the last 8760 hours. HbA1C: Recent Labs    05/03/20 0526  HGBA1C 7.8*   CBG: Recent Labs  Lab 05/03/20 1707 05/03/20 2046 05/04/20 0744 05/04/20 1129 05/04/20 1636  GLUCAP 215* 147* 199* 163* 162*   Lipid Profile: No results for input(s): CHOL, HDL, LDLCALC, TRIG, CHOLHDL, LDLDIRECT in the last 72 hours. Thyroid Function Tests: No results for input(s): TSH, T4TOTAL, FREET4, T3FREE, THYROIDAB in the last 72 hours. Anemia Panel: Recent Labs    05/03/20 0526  VITAMINB12 522  FOLATE 11.0  FERRITIN 253  TIBC 158*  IRON 52  RETICCTPCT 1.9   Sepsis Labs: Recent Labs  Lab 04/28/20 1235 04/28/20 1435  LATICACIDVEN 1.8 1.4    Recent Results (from the past 240 hour(s))  Blood culture (routine x 2)     Status: None   Collection Time: 04/28/20 12:30 PM   Specimen: BLOOD  Result Value Ref Range Status   Specimen Description   Final    BLOOD RIGHT ARM UPPER Performed at [facility] [address] [place], [place] [address]  Special Requests   Final    BOTTLES DRAWN AEROBIC ONLY Blood Culture results may not be optimal due to an inadequate volume of blood received in culture bottles Performed at [facility] [address] [place], [place] [address]    Culture   Final    NO GROWTH 5 DAYS Performed at [facility], [place] [address], [place], [place] [address]    Report Status 05/03/2020 FINAL  Final  Blood culture (routine x 2)     Status: None   Collection Time: 04/28/20 12:41 PM   Specimen: BLOOD  Result Value Ref Range Status   Specimen Description   Final    BLOOD LEFT ARM UPPER Performed at [facility]  [address] [place], [place] [address]    Special Requests   Final    BOTTLES DRAWN AEROBIC AND ANAEROBIC Blood Culture adequate volume Performed at [facility] [address] [place], [place] [address]    Culture   Final    NO GROWTH 5 DAYS Performed at [facility], [place] [address], [place], [place] [address]    Report Status 05/03/2020 FINAL  Final  Respiratory Panel by RT PCR (Flu A&B, Covid) - Nasopharyngeal Swab     Status: None   Collection Time: 04/28/20  1:21 PM   Specimen: Nasopharyngeal Swab  Result Value Ref Range Status   SARS Coronavirus 2 by RT PCR NEGATIVE NEGATIVE Final    Comment: (NOTE) SARS-CoV-2 target nucleic acids are NOT DETECTED.  The SARS-CoV-2 RNA is generally detectable in upper respiratoy specimens during the acute phase of infection. The lowest concentration of SARS-CoV-2 viral copies this assay can detect is 131 copies/mL. A negative result does not preclude SARS-Cov-2 infection and should not be used as the sole basis for treatment or other patient management decisions. A negative result may occur with  improper specimen collection/handling, submission of specimen other than nasopharyngeal swab, presence of viral mutation(s) within the areas targeted by this assay, and inadequate number of viral copies (<131 copies/mL). A negative result must be combined with clinical observations, patient history, and epidemiological information. The expected result is Negative.  Fact Sheet for Patients:  PinkCheek.be  Fact Sheet for Healthcare Providers:  GravelBags.it  This test is no t yet approved or cleared by the [place] FDA and  has been authorized for detection and/or diagnosis of SARS-CoV-2 by FDA under an Emergency Use Authorization (EUA). This EUA will remain  in effect (meaning this test can be used) for the duration of the COVID-19 declaration under Section  564(b)(1) of the Act, 21 U.S.C. section 360bbb-3(b)(1), unless the authorization is terminated or revoked sooner.     Influenza A by PCR NEGATIVE NEGATIVE Final   Influenza B by PCR NEGATIVE NEGATIVE Final    Comment: (NOTE) The Xpert Xpress SARS-CoV-2/FLU/RSV assay is intended as an aid in  the diagnosis of influenza from Nasopharyngeal swab specimens and  should not be used as a sole basis for treatment. Nasal washings and  aspirates are unacceptable for Xpert Xpress SARS-CoV-2/FLU/RSV  testing.  Fact Sheet for Patients: PinkCheek.be  Fact Sheet for Healthcare Providers: GravelBags.it  This test is not yet approved or cleared by the [place] FDA and  has been authorized for detection and/or diagnosis of SARS-CoV-2 by  FDA under an Emergency Use Authorization (EUA). This EUA will remain  in effect (meaning this test can be used) for the duration of the  Covid-19  declaration under Section 564(b)(1) of the Act, 21  U.S.C. section 360bbb-3(b)(1), unless the authorization is  terminated or revoked. Performed at [facility], [place] [address], [place], [place] [address]   Culture, Urine     Status: Abnormal   Collection Time: 04/28/20  8:16 PM   Specimen: Urine, Catheterized  Result Value Ref Range Status   Specimen Description   Final    URINE, CATHETERIZED Performed at [place] [address] [place], [place] [address]    Special Requests   Final    NONE Performed at [facility], [place] [address] [place], [place] [address]    Culture >=100,000 COLONIES/mL SERRATIA MARCESCENS (A)  Final   Report Status 05/01/2020 FINAL  Final   Organism ID, Bacteria SERRATIA MARCESCENS (A)  Final      Susceptibility   Serratia marcescens - MIC*    CEFAZOLIN >=64 RESISTANT Resistant     CEFTRIAXONE <=0.25 SENSITIVE Sensitive     CIPROFLOXACIN <=0.25 SENSITIVE  Sensitive     GENTAMICIN <=1 SENSITIVE Sensitive     NITROFURANTOIN 128 RESISTANT Resistant     TRIMETH/SULFA <=20 SENSITIVE Sensitive     * >=100,000 COLONIES/mL SERRATIA MARCESCENS  MRSA PCR Screening     Status: None   Collection Time: 04/30/20  8:21 PM   Specimen: Nasal Mucosa; Nasopharyngeal  Result Value Ref Range Status   MRSA by PCR NEGATIVE NEGATIVE Final    Comment:        The GeneXpert MRSA Assay (FDA approved for NASAL specimens only), is one component of a comprehensive MRSA colonization surveillance program. It is not intended to diagnose MRSA infection nor to guide or monitor treatment for MRSA infections. Performed at [facility], [place] [address] [place], [place] [address]   SARS Coronavirus 2 by RT PCR (hospital order, performed in [facility] hospital lab) Nasopharyngeal Nasopharyngeal Swab     Status: None   Collection Time: 05/04/20  2:32 PM   Specimen: Nasopharyngeal Swab  Result Value Ref Range Status   SARS Coronavirus 2 NEGATIVE NEGATIVE Final    Comment: (NOTE) SARS-CoV-2 target nucleic acids are NOT DETECTED.  The SARS-CoV-2 RNA is generally detectable in upper and lower respiratory specimens during the acute phase of infection. The lowest concentration of SARS-CoV-2 viral copies this assay can detect is 250 copies / mL. A negative result does not preclude SARS-CoV-2 infection and should not be used as the sole basis for treatment or other patient management decisions.  A negative result may occur with improper specimen collection / handling, submission of specimen other than nasopharyngeal swab, presence of viral mutation(s) within the areas targeted by this assay, and inadequate number of viral copies (<250 copies / mL). A negative result must be combined with clinical observations, patient history, and epidemiological information.  Fact Sheet for Patients:   StrictlyIdeas.no  Fact Sheet for  Healthcare Providers: BankingDealers.co.za  This test is not yet approved or  cleared by the [place] FDA and has been authorized for detection and/or diagnosis of SARS-CoV-2 by FDA under an Emergency Use Authorization (EUA).  This EUA will remain in effect (meaning this test can be used) for the duration of the COVID-19 declaration under Section 564(b)(1) of the Act, 21 U.S.C. section 360bbb-3(b)(1), unless the authorization is terminated or revoked sooner.  Performed at [facility], [place] [address], [place], [place] [address]      Radiology Studies: No results found.  Scheduled Meds: .  apixaban  10 mg Oral BID   Followed by  . [START ON 05/10/2020] apixaban  5 mg Oral BID  . Chlorhexidine Gluconate Cloth  6 each Topical Daily  . collagenase   Topical Daily  . insulin aspart  0-5 Units Subcutaneous QHS  . insulin aspart  0-9 Units Subcutaneous TID WC  . mouth rinse  15 mL Mouth Rinse BID  . pantoprazole  40 mg Oral BID  . sodium chloride flush  10-40 mL Intracatheter Q12H  . sulfamethoxazole-trimethoprim  1 tablet Oral Q12H   Continuous Infusions:    LOS: 6 days   Marylu Lund, MD [facility] Pager On Amion  If 7PM-7AM, please contact night-coverage 05/04/2020, 5:11 PM

## 2020-05-04 NOTE — Progress Notes (Signed)
Chaplain delivered Advanced Directives forms to patient.  Chaplain noted "Altered Mental Status" in charting.  Chaplain asked patient where he was and where he hoped to go when released. Patient replied, "I want to go back to [facility] inquired, "where are you now?" And he said "[place]."  Upon the second inquiry, patient told chaplain he believed he was at [facility]. Patient spoke of sister's recent death and he had not family members to make health care decisions for him. When explaining that he could not have joint HCPOA, he said he wanted his brother-in-law, Mr. Emeline Darling to be primary and his friend Holley Bouche to be secondary on the AD.  Patient also said he could not read the form because he did not have his glasses. Chaplain will follow-up as well as be available should Mr. Emeline Darling or Ms Freda Munro visit.  Rev. Tamsen Snider Pager [phone]

## 2020-05-04 NOTE — Progress Notes (Signed)
Inpatient Diabetes Program Recommendations  AACE/ADA: New Consensus Statement on Inpatient Glycemic Control (2015)  Target Ranges:  Prepandial:   less than 140 mg/dL      Peak postprandial:   less than 180 mg/dL (1-2 hours)      Critically ill patients:  140 - 180 mg/dL   Lab Results  Component Value Date   GLUCAP 199 (H) 05/04/2020   HGBA1C 7.8 (H) 05/03/2020    Review of Glycemic Control Results for Jay Holloway, Jay Holloway (MRN [id number]) as of 05/02/2020 10:42  Ref. Range 05/01/2020 08:06 05/01/2020 13:45 05/01/2020 16:23 05/01/2020 19:50 05/02/2020 08:02  Glucose-Capillary Latest Ref Range: 70 - 99 mg/dL 245 (H) 202 (H) 186 (H) 124 (H) 206 (H)  Results for [name], [name] (MRN [id number]) as of 05/04/2020 10:43  Ref. Range 05/03/2020 08:01 05/03/2020 12:19 05/03/2020 17:07 05/03/2020 20:46 05/04/2020 07:44  Glucose-Capillary Latest Ref Range: 70 - 99 mg/dL 209 (H) 179 (H) 215 (H) 147 (H) 199 (H)   Diabetes history: DM 2 Outpatient Diabetes medications: Lantus 10 units, Metformin 500 mg bid, Ozempic 1 mg Weekly on wednesdays Current orders for Inpatient glycemic control:  Novolog 0-9 units tid + hs  Inpatient Diabetes Program Recommendations:    -  Consider half of home basal insulin dose, Lantus 5 units.  Thanks, Tama Headings RN, MSN, BC-ADM Inpatient Diabetes Coordinator Team Pager [phone] (8a-5p)

## 2020-05-04 NOTE — Progress Notes (Signed)
   05/04/20 2020  Assess: MEWS Score  Temp 98.2 F (36.8 C)  BP 100/61  Pulse Rate (!) 103  Resp 18  SpO2 90 %  O2 Device Nasal Cannula  O2 Flow Rate (L/min) 2 L/min  Assess: MEWS Score  MEWS Temp 0  MEWS Systolic 1  MEWS Pulse 1  MEWS RR 0  MEWS LOC 0  MEWS Score 2  MEWS Score Color Yellow  Assess: if the MEWS score is Yellow or Red  Were vital signs taken at a resting state? Yes  Focused Assessment No change from prior assessment  Early Detection of Sepsis Score *See Row Information* Low  MEWS guidelines implemented *See Row Information* Yes  Treat  MEWS Interventions Escalated (See documentation below) (on-call made aware of MEWS)  Pain Scale 0-10  Pain Score 0  Take Vital Signs  Increase Vital Sign Frequency  Yellow: Q 2hr X 2 then Q 4hr X 2, if remains yellow, continue Q 4hrs  Escalate  MEWS: Escalate Yellow: discuss with charge nurse/RN and consider discussing with provider and RRT  Notify: Charge Nurse/RN  Name of Charge Nurse/RN Notified Kamau Weatherall, RN  Date Charge Nurse/RN Notified 05/04/20  Time Charge Nurse/RN Notified 2038  Notify: Provider  Provider Name/Title NP  Date Provider Notified 05/04/20  Time Provider Notified 2039  Notification Type Page  Notification Reason Other (Comment) (yellow MEWS)  Response No new orders

## 2020-05-05 DIAGNOSIS — E1165 Type 2 diabetes mellitus with hyperglycemia: Secondary | ICD-10-CM | POA: Diagnosis not present

## 2020-05-05 DIAGNOSIS — I82492 Acute embolism and thrombosis of other specified deep vein of left lower extremity: Secondary | ICD-10-CM | POA: Diagnosis not present

## 2020-05-05 DIAGNOSIS — M79662 Pain in left lower leg: Secondary | ICD-10-CM | POA: Diagnosis not present

## 2020-05-05 DIAGNOSIS — E782 Mixed hyperlipidemia: Secondary | ICD-10-CM | POA: Diagnosis not present

## 2020-05-05 DIAGNOSIS — J9602 Acute respiratory failure with hypercapnia: Secondary | ICD-10-CM | POA: Diagnosis not present

## 2020-05-05 DIAGNOSIS — K219 Gastro-esophageal reflux disease without esophagitis: Secondary | ICD-10-CM | POA: Diagnosis not present

## 2020-05-05 DIAGNOSIS — J9601 Acute respiratory failure with hypoxia: Secondary | ICD-10-CM | POA: Diagnosis not present

## 2020-05-05 DIAGNOSIS — I10 Essential (primary) hypertension: Secondary | ICD-10-CM | POA: Diagnosis not present

## 2020-05-05 DIAGNOSIS — M79661 Pain in right lower leg: Secondary | ICD-10-CM | POA: Diagnosis not present

## 2020-05-05 DIAGNOSIS — G8929 Other chronic pain: Secondary | ICD-10-CM | POA: Diagnosis not present

## 2020-05-05 DIAGNOSIS — J9600 Acute respiratory failure, unspecified whether with hypoxia or hypercapnia: Secondary | ICD-10-CM | POA: Diagnosis not present

## 2020-05-05 DIAGNOSIS — R1312 Dysphagia, oropharyngeal phase: Secondary | ICD-10-CM | POA: Diagnosis not present

## 2020-05-05 DIAGNOSIS — R197 Diarrhea, unspecified: Secondary | ICD-10-CM | POA: Diagnosis not present

## 2020-05-05 DIAGNOSIS — I2609 Other pulmonary embolism with acute cor pulmonale: Secondary | ICD-10-CM | POA: Diagnosis not present

## 2020-05-05 DIAGNOSIS — Z7401 Bed confinement status: Secondary | ICD-10-CM | POA: Diagnosis not present

## 2020-05-05 DIAGNOSIS — R112 Nausea with vomiting, unspecified: Secondary | ICD-10-CM | POA: Diagnosis not present

## 2020-05-05 DIAGNOSIS — N179 Acute kidney failure, unspecified: Secondary | ICD-10-CM | POA: Diagnosis not present

## 2020-05-05 DIAGNOSIS — M2550 Pain in unspecified joint: Secondary | ICD-10-CM | POA: Diagnosis not present

## 2020-05-05 DIAGNOSIS — Z7189 Other specified counseling: Secondary | ICD-10-CM | POA: Diagnosis not present

## 2020-05-05 DIAGNOSIS — Z515 Encounter for palliative care: Secondary | ICD-10-CM | POA: Diagnosis not present

## 2020-05-05 DIAGNOSIS — M6281 Muscle weakness (generalized): Secondary | ICD-10-CM | POA: Diagnosis not present

## 2020-05-05 DIAGNOSIS — E1169 Type 2 diabetes mellitus with other specified complication: Secondary | ICD-10-CM | POA: Diagnosis not present

## 2020-05-05 DIAGNOSIS — R634 Abnormal weight loss: Secondary | ICD-10-CM | POA: Diagnosis not present

## 2020-05-05 DIAGNOSIS — R627 Adult failure to thrive: Secondary | ICD-10-CM | POA: Diagnosis not present

## 2020-05-05 DIAGNOSIS — G9341 Metabolic encephalopathy: Secondary | ICD-10-CM | POA: Diagnosis not present

## 2020-05-05 LAB — GLUCOSE, CAPILLARY
Glucose-Capillary: 165 mg/dL — ABNORMAL HIGH (ref 70–99)
Glucose-Capillary: 235 mg/dL — ABNORMAL HIGH (ref 70–99)

## 2020-05-05 MED ORDER — TRAMADOL HCL 50 MG PO TABS
50.0000 mg | ORAL_TABLET | Freq: Two times a day (BID) | ORAL | 0 refills | Status: DC | PRN
Start: 2020-05-05 — End: 2020-05-19

## 2020-05-05 MED ORDER — APIXABAN 5 MG PO TABS
5.0000 mg | ORAL_TABLET | Freq: Two times a day (BID) | ORAL | 0 refills | Status: DC
Start: 2020-05-10 — End: 2020-07-31

## 2020-05-05 MED ORDER — APIXABAN 5 MG PO TABS
10.0000 mg | ORAL_TABLET | Freq: Two times a day (BID) | ORAL | 0 refills | Status: DC
Start: 2020-05-05 — End: 2020-05-19

## 2020-05-05 NOTE — Progress Notes (Signed)
Patient cleared for discharge. While repositioning patient. RN noticed swelling near the penial area r/t condom cath placement and foreskin being pushed back. Condom cath removed MD made aware. Report called to [facility]. Skylar RN received report. Awaiting PTAR.

## 2020-05-05 NOTE — Discharge Summary (Signed)
Physician Discharge Summary  Jay Holloway EXB:[id number] DOB: [birth year] DOA: 04/28/2020  PCP: [name], MD  Admit date: 04/28/2020 Discharge date: 05/05/2020  Admitted From: SNF Disposition:  SNF  Recommendations for Outpatient Follow-up:  1. Follow up with PCP in 1-2 weeks 2. Recommend Palliative Care referral at facility. Please complete MOST form with patient when pt arrives to facility   Discharge Condition:Improved CODE STATUS:DNR Diet recommendation: Per SLP: Diet recommendations: Dysphagia 3 (mechanical soft);Thin liquid Liquids provided via: Cup;Straw Medication Administration: Crushed with puree Supervision: Patient able to self feed;Intermittent supervision to cue for compensatory strategies Compensations: Minimize environmental distractions;Small sips/bites;Slow rate;Follow solids with liquid;Multiple dry swallows after each bite/sip Postural Changes and/or Swallow Maneuvers: Seated upright 90 degrees;Upright 30-60 min after meal  Brief/Interim Summary: 74 year old male with history of dementia, prostate cancer, DM-2, COPD? CHF, HTN, GERD and recent hospitalization from 9/17-9/22 with generalized weakness, mild DKA, respiratory failure and encephalopathy brought to ED from SNF with altered mental status, hypoxemia and generalized weakness, and admitted for acute hypoxic hypercapnic respiratory failure, pneumonia, AKI and acute metabolic encephalopathy. Per EMS, hypoxic to 37% on RA.  Discharge Diagnoses:  Principal Problem:   Acute respiratory failure with hypoxia and hypercarbia (HCC) Active Problems:   Type 2 diabetes mellitus with hyperglycemia, with long-term current use of insulin (HCC)   Essential hypertension   AKI (acute kidney injury) (HCC)   Hyperkalemia   GERD without esophagitis   Acute metabolic encephalopathy   Pressure injury of sacral region, stage 2 (HCC)   Hypomagnesemia   Mixed diabetic hyperlipidemia associated with type 2 diabetes mellitus  (HCC)   Acute respiratory failure with hypoxia and hypercapnia-might have undiagnosed sleep apnea or COPD. CTA chest with LLL pneumonia and small LLL PE. He has more mobileright femoral vein DVT.Respiratory failure improved. Currently on 2 L. -Now on eliquis -completed ceftriaxone and azithromycin for LLL pneumonia  Acute LLL PE/acute RLE DVT: LE US revealed acute DVT with mobile thrombus in right common femoral vein, and right posterior tibial vein. CTA showed acute small LLL PE -Now on eliquis per above  Severe sepsis due to LLL pneumonia/possible Serratia marcescens UTI: POA. Met criteria on admission with leukocytosis, tachypnea and signs of endorgan damage as evidenced by encephalopathy and AKI. Concern for gram-negative pneumonia due to aspiration. Noted on CTA chest. Urine culture with Serratia marcescens sensitive to ceftriaxone. Blood cultures NGTD. -completed IV ceftriaxone and azithromycin, and later bactrim  Acute metabolic encephalopathy:  - Resolved - Suspect secondary to hypercarbia as well as aspiration pneumonia  Dysphagia: -Appreciate input by SLP who called pt's facility -Patient noted to have progressive worsening of dysphagia but despite knowing the risks, still desires to eat -Appreciate assistance by Palliative Care. Now on dysphagia diet as tolerated. See above recommendations  Mildly elevated troponin: High-sensitivity troponin 61. EKG with sinus tachycardia, RBBB and LAFB, unchanged from baseline. Elevated troponin likely demand ischemia from respiratory failure.   Acute kidney injury/mild azotemia: -Resolved  DM-2 with hyperglycemia: -Continued Lantus 5 units twice daily while in hospital -Hemoglobin A1c 7.8 -resume home diabetic meds on d/c  Essential hypertension:BP stable at this time  Normocytic anemia/thrombocytopenia:  -Hgb trends overall stable -Iron level within normal limit  Mild elevated LFT: Likely ischemic.  Resolved.  Hyperkalemia: Corrected and stable  Hypomagnesemia -replaced  Metabolic alkalosis: Likely compensation for respiratory acidosis. Remains stable  Leukocytosis with bandemia: Resolved.  DNR/DNI -Appreciate input by Palliative Care -Cont with PO intake with pt understanding risk for potential aspiration -Recommend Palliative Care referral  at facility and for pt to fill out MOST form at facility when he has access to his glasses so he can review the form himself  Discharge Instructions   Allergies as of 05/05/2020   No Known Allergies     Medication List    STOP taking these medications   carvedilol 6.25 MG tablet Commonly known as: COREG   HYDROcodone-acetaminophen 7.5-325 MG tablet Commonly known as: NORCO   loperamide 2 MG capsule Commonly known as: IMODIUM   ondansetron 4 MG tablet Commonly known as: ZOFRAN     TAKE these medications   apixaban 5 MG Tabs tablet Commonly known as: ELIQUIS Take 2 tablets (10 mg total) by mouth 2 (two) times daily for 4 days.   apixaban 5 MG Tabs tablet Commonly known as: ELIQUIS Take 1 tablet (5 mg total) by mouth 2 (two) times daily. Start taking on: May 10, 2020   atorvastatin 10 MG tablet Commonly known as: LIPITOR Take 10 mg by mouth daily.   collagenase ointment Commonly known as: SANTYL Apply topically daily. Apply Santyl to coccyx wound in a thick layer. Cover with a saline moistened gauze, then dry gauze..  Change daily.   feeding supplement (GLUCERNA SHAKE) Liqd Take 237 mLs by mouth 3 (three) times daily between meals.   furosemide 40 MG tablet Commonly known as: LASIX Take 0.5 tablets (20 mg total) by mouth daily. fluid What changed: Another medication with the same name was removed. Continue taking this medication, and follow the directions you see here.   insulin glargine 100 UNIT/ML injection Commonly known as: LANTUS Inject 0.1 mLs (10 Units total) into the skin daily. Give only 5  units for am glucose less than 90.   melatonin 3 MG Tabs tablet Take 1 tablet (3 mg total) by mouth at bedtime.   metFORMIN 500 MG 24 hr tablet Commonly known as: GLUCOPHAGE-XR Take 500 mg by mouth in the morning and at bedtime.   Multivitamin Adults Tabs Take 1 tablet by mouth daily.   OZEMPIC (1 MG/DOSE) [place] Inject 1 mg into the skin once a week. Wed   pantoprazole 40 MG tablet Commonly known as: PROTONIX Take 1 tablet (40 mg total) by mouth 2 (two) times daily.   Potassium Chloride ER 20 MEQ Tbcr Take 20 mEq by mouth daily.   traMADol 50 MG tablet Commonly known as: ULTRAM Take 1 tablet (50 mg total) by mouth every 12 (twelve) hours as needed for moderate pain or severe pain.   Vitamin D3 25 MCG (1000 UT) Caps Take 1,000 Units by mouth daily.       Contact information for after-discharge care    Destination    [name] Preferred SNF .   Service: Skilled Nursing Contact information: [address] [place] [phone]                 No Known Allergies  Consultations:  Palliative Care  Procedures/Studies: CT ABDOMEN PELVIS WO CONTRAST  Result Date: 04/28/2020 CLINICAL DATA:  74 year old male with weakness, lethargy. "Malignancy workup" EXAM: CT CHEST, ABDOMEN AND PELVIS WITHOUT CONTRAST TECHNIQUE: Multidetector CT imaging of the chest, abdomen and pelvis was performed following the standard protocol without IV contrast. COMPARISON:  Head CT earlier today. Portable chest earlier today. CT Abdomen and Pelvis 12/24/2019 and earlier. FINDINGS: CT CHEST FINDINGS Cardiovascular: No cardiomegaly or pericardial effusion. Vascular patency is not evaluated in the absence of IV contrast. Mediastinum/Nodes: No lymphadenopathy is evident in the absence of  contrast. Lungs/Pleura: Chronic elevation of the right hemidiaphragm appears stable since 2015. There is multifocal nodular and confluent peribronchial opacity in the left lower  lobe with trace air bronchograms, new since 12/24/2019. No pleural effusion. Major airways are patent.  Upper lobes and middle lobes are clear. Musculoskeletal: Spinal ankylosis. Degenerative changes at the visible shoulders. No acute or suspicious osseous lesion. CT ABDOMEN PELVIS FINDINGS Hepatobiliary: Surgically absent gallbladder as before. Negative noncontrast liver. Pancreas: Negative noncontrast pancreas. Spleen: Negative. Adrenals/Urinary Tract: Adrenal glands appear stable and within normal limits. Chronic exophytic right lower pole renal cyst with simple fluid density. Chronic smaller left midpole renal cyst with simple fluid density. No hydronephrosis. 4 mm left renal lower pole calculus. No hydroureter. Unremarkable urinary bladder. Numerous pelvic phleboliths. Stomach/Bowel: Fluid in nondistended rectum. Redundant sigmoid colon mostly containing gas. Diverticulosis of the descending colon which is decompressed. Mild stool in the transverse colon which is partially decompressed. Mildly redundant right colon containing gas and stool. Normal appendix on series 2, image 81. No large bowel inflammation. No dilated small bowel. Moderately air in fluid distended stomach, but the gastric antrum and proximal duodenum appear unremarkable. Decompressed duodenum. No free air. No free fluid. Vascular/Lymphatic: Normal caliber aorta. Mild calcified plaque. Vascular patency is not evaluated in the absence of IV contrast. No lymphadenopathy is evident. Reproductive: Negative. Other: Mild dependent body wall edema. Trace presacral stranding or fluid. Musculoskeletal: Chronic lumbar spine ankylosis. But the SI joints remain relatively patent. Advanced degenerative changes at both hips. No acute or suspicious osseous lesion identified. IMPRESSION: 1. Left lower lobe opacity suspicious for Bronchopneumonia. Consider also aspiration. No pleural effusion. 2. Otherwise no acute or malignant process identified in the  noncontrast chest, abdomen, or pelvis. 3. Chronic spinal ankylosis. Left nephrolithiasis. Descending colon diverticulosis. Electronically Signed   By: Genevie Ann M.D.   On: 04/28/2020 21:04   CT Head Wo Contrast  Result Date: 04/28/2020 CLINICAL DATA:  Altered mental status. EXAM: CT HEAD WITHOUT CONTRAST TECHNIQUE: Contiguous axial images were obtained from the base of the skull through the vertex without intravenous contrast. COMPARISON:  CT head 04/14/2020 FINDINGS: Brain: No evidence of acute infarction, hemorrhage, hydrocephalus, extra-axial collection or mass lesion/mass effect. Mild atrophy. Mild white matter changes consistent with chronic microvascular ischemia. Vascular: Negative for hyperdense vessel Skull: Negative Sinuses/Orbits: Paranasal sinuses clear.  Negative orbit. Other: None IMPRESSION: No acute abnormality. Electronically Signed   By: Franchot Gallo M.D.   On: 04/28/2020 13:18   CT HEAD WO CONTRAST  Result Date: 04/14/2020 CLINICAL DATA:  Altered mental status. Generalized weakness for 1.5 weeks. Slurred speech. Slipped from chair. EXAM: CT HEAD WITHOUT CONTRAST TECHNIQUE: Contiguous axial images were obtained from the base of the skull through the vertex without intravenous contrast. COMPARISON:  None. FINDINGS: Brain: No evidence of parenchymal hemorrhage or extra-axial fluid collection. No mass lesion, mass effect, or midline shift. No CT evidence of acute infarction. Nonspecific mild subcortical and periventricular white matter hypodensity, most in keeping with chronic small vessel ischemic change. Cerebral volume is age appropriate. No ventriculomegaly. Vascular: No acute abnormality. Skull: No evidence of calvarial fracture. Sinuses/Orbits: The visualized paranasal sinuses are essentially clear. Other:  The mastoid air cells are unopacified. IMPRESSION: 1. No evidence of acute intracranial abnormality. No evidence of calvarial fracture. 2. Mild chronic small vessel ischemic changes  in the cerebral white matter. Electronically Signed   By: Ilona Sorrel M.D.   On: 04/14/2020 17:56   CT CHEST WO CONTRAST  Result Date: 04/28/2020  CLINICAL DATA:  74 year old male with weakness, lethargy. "Malignancy workup" EXAM: CT CHEST, ABDOMEN AND PELVIS WITHOUT CONTRAST TECHNIQUE: Multidetector CT imaging of the chest, abdomen and pelvis was performed following the standard protocol without IV contrast. COMPARISON:  Head CT earlier today. Portable chest earlier today. CT Abdomen and Pelvis 12/24/2019 and earlier. FINDINGS: CT CHEST FINDINGS Cardiovascular: No cardiomegaly or pericardial effusion. Vascular patency is not evaluated in the absence of IV contrast. Mediastinum/Nodes: No lymphadenopathy is evident in the absence of contrast. Lungs/Pleura: Chronic elevation of the right hemidiaphragm appears stable since 2015. There is multifocal nodular and confluent peribronchial opacity in the left lower lobe with trace air bronchograms, new since 12/24/2019. No pleural effusion. Major airways are patent.  Upper lobes and middle lobes are clear. Musculoskeletal: Spinal ankylosis. Degenerative changes at the visible shoulders. No acute or suspicious osseous lesion. CT ABDOMEN PELVIS FINDINGS Hepatobiliary: Surgically absent gallbladder as before. Negative noncontrast liver. Pancreas: Negative noncontrast pancreas. Spleen: Negative. Adrenals/Urinary Tract: Adrenal glands appear stable and within normal limits. Chronic exophytic right lower pole renal cyst with simple fluid density. Chronic smaller left midpole renal cyst with simple fluid density. No hydronephrosis. 4 mm left renal lower pole calculus. No hydroureter. Unremarkable urinary bladder. Numerous pelvic phleboliths. Stomach/Bowel: Fluid in nondistended rectum. Redundant sigmoid colon mostly containing gas. Diverticulosis of the descending colon which is decompressed. Mild stool in the transverse colon which is partially decompressed. Mildly redundant  right colon containing gas and stool. Normal appendix on series 2, image 81. No large bowel inflammation. No dilated small bowel. Moderately air in fluid distended stomach, but the gastric antrum and proximal duodenum appear unremarkable. Decompressed duodenum. No free air. No free fluid. Vascular/Lymphatic: Normal caliber aorta. Mild calcified plaque. Vascular patency is not evaluated in the absence of IV contrast. No lymphadenopathy is evident. Reproductive: Negative. Other: Mild dependent body wall edema. Trace presacral stranding or fluid. Musculoskeletal: Chronic lumbar spine ankylosis. But the SI joints remain relatively patent. Advanced degenerative changes at both hips. No acute or suspicious osseous lesion identified. IMPRESSION: 1. Left lower lobe opacity suspicious for Bronchopneumonia. Consider also aspiration. No pleural effusion. 2. Otherwise no acute or malignant process identified in the noncontrast chest, abdomen, or pelvis. 3. Chronic spinal ankylosis. Left nephrolithiasis. Descending colon diverticulosis. Electronically Signed   By: Genevie Ann M.D.   On: 04/28/2020 21:04   CT ANGIO CHEST PE W OR WO CONTRAST  Result Date: 04/30/2020 CLINICAL DATA:  Shortness of breath. EXAM: CT ANGIOGRAPHY CHEST WITH CONTRAST TECHNIQUE: Multidetector CT imaging of the chest was performed using the standard protocol during bolus administration of intravenous contrast. Multiplanar CT image reconstructions and MIPs were obtained to evaluate the vascular anatomy. CONTRAST:  195mL OMNIPAQUE IOHEXOL 350 MG/ML SOLN COMPARISON:  April 28, 2020. FINDINGS: Cardiovascular: Small filling defect is noted in lower lobe branch of left pulmonary artery consistent with acute pulmonary embolus. Normal cardiac size. No pericardial effusion is noted. There is no evidence of thoracic aortic dissection or aneurysm. Mediastinum/Nodes: 1.2 cm left thyroid nodule is noted. Esophagus is unremarkable. No adenopathy is noted. Lungs/Pleura:  No pneumothorax is noted. Small right pleural effusion is noted with adjacent right lower lobe subsegmental atelectasis. Large left lower lobe opacity is noted concerning for pneumonia or possibly atelectasis. Elevated right hemidiaphragm is noted. Upper Abdomen: No acute abnormality. Musculoskeletal: No chest wall abnormality. No acute or significant osseous findings. Review of the MIP images confirms the above findings. IMPRESSION: 1. Small filling defect is noted in lower lobe branch of  left pulmonary artery consistent with acute pulmonary embolus. Critical Value/emergent results were called by telephone at the time of interpretation on 04/30/2020 at 10:03 am to provider [facility] , who verbally acknowledged these results. 2. Small right pleural effusion is noted with adjacent right lower lobe subsegmental atelectasis. Elevated right hemidiaphragm is noted. 3. Large left lower lobe opacity is noted concerning for pneumonia or possibly atelectasis. 1.2 cm left thyroid nodule is noted. Electronically Signed   By: Marijo Conception M.D.   On: 04/30/2020 10:03   MR BRAIN WO CONTRAST  Result Date: 04/14/2020 CLINICAL DATA:  74 year old male with altered mental status, weakness. EXAM: MRI HEAD WITHOUT CONTRAST TECHNIQUE: Multiplanar, multiecho pulse sequences of the brain and surrounding structures were obtained without intravenous contrast. COMPARISON:  Head CT earlier today. FINDINGS: Brain: No restricted diffusion to suggest acute infarction. No midline shift, mass effect, evidence of mass lesion, ventriculomegaly, extra-axial collection or acute intracranial hemorrhage. Cervicomedullary junction and pituitary are within normal limits. Pearline Cables and white matter signal is within normal limits for age throughout the brain. No cortical encephalomalacia or chronic cerebral blood products identified. Vascular: Major intracranial vascular flow voids are preserved. Skull and upper cervical spine: Degenerative ligamentous  hypertrophy about the odontoid resulting in mild spinal stenosis at the C1 level (series 10, image 13). Disc degeneration elsewhere in the visible cervical spine. Visualized bone marrow signal is within normal limits. Sinuses/Orbits: Negative. Other: Mastoids are clear. Grossly normal visible internal auditory structures. Visible scalp and face appear negative. IMPRESSION: 1. No acute intracranial abnormality. Essentially normal for age noncontrast MRI appearance of the brain. 2. 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 Electronically Signed   By: Genevie Ann M.D.   On: 04/14/2020 22:12   VAS [place] IVC/ILIAC (VENOUS ONLY)  Result Date: 04/29/2020 IVC/ILIAC STUDY Indications: Right common femoral vein mobile DVT Other Factors: Hypoxia, elevated d-dimer. Limitations: Air/bowel gas, patient discomfort, patient position, and poor patient cooperation.  Comparison Study: No prior study Performing Technologist: Maudry Mayhew MHA, RDMS, RVT, RDCS  Examination Guidelines: A complete evaluation includes B-mode imaging, spectral Doppler, color Doppler, and power Doppler as needed of all accessible portions of each vessel. Bilateral testing is considered an integral part of a complete examination. Limited examinations for reoccurring indications may be performed as noted.  +----------------+---------+-----------+---------+-----------+--------+       CIV       RT-PatentRT-ThrombusLT-PatentLT-ThrombusComments +----------------+---------+-----------+---------+-----------+--------+ Common Iliac Mid patent              patent                      +----------------+---------+-----------+---------+-----------+--------+  +-----------------------+---------+-----------+---------+-----------+--------+           EIV          RT-PatentRT-ThrombusLT-PatentLT-ThrombusComments +-----------------------+---------+-----------+---------+-----------+--------+ External Iliac Vein  Mid patent              patent                      +-----------------------+---------+-----------+---------+-----------+--------+   Summary: IVC/Iliac: No obvious evidence of occlusive thrombus in bilateral iliac veins. Unable to visualize IVC due to technical limitations.  *See table(s) above for measurements and observations.  Electronically signed by Harold Barban MD on 04/29/2020 at 1:05:29 PM.   Final    DG Chest Portable 1 View  Result Date: 04/28/2020 CLINICAL DATA:  Hypoxia EXAM: PORTABLE CHEST 1 VIEW COMPARISON:  04/14/2020 FINDINGS: The heart size and mediastinal contours  are within normal limits. Atherosclerotic calcification of the aortic knob. Unchanged mild elevation of the right hemidiaphragm with minimal right basilar atelectasis. Lungs appear otherwise clear. No pleural effusion or pneumothorax. The visualized skeletal structures are unremarkable. IMPRESSION: No active disease. Electronically Signed   By: Davina Poke D.O.   On: 04/28/2020 13:09   DG Chest Port 1 View  Result Date: 04/14/2020 CLINICAL DATA:  Weakness. EXAM: PORTABLE CHEST 1 VIEW COMPARISON:  03/21/2020 FINDINGS: Cardiac silhouette is normal in size. No mediastinal or hilar masses. Linear opacities noted at the right lung base consistent with scarring or atelectasis, stable. Lungs otherwise clear. No pleural effusion or pneumothorax. Skeletal structures are grossly intact. IMPRESSION: No active disease. Electronically Signed   By: Lajean Manes M.D.   On: 04/14/2020 15:01   DG Swallowing Func-Speech Pathology  Result Date: 05/01/2020 Objective Swallowing Evaluation: Type of Study: MBS-Modified Barium Swallow Study  Patient Details Name: Fidencio Duddy MRN: [id number] Date of Birth: January 23, 1946 Today's Date: 05/01/2020 Time: SLP Start Time (ACUTE ONLY): 1440 -SLP Stop Time (ACUTE ONLY): 1456 SLP Time Calculation (min) (ACUTE ONLY): 16 min Past Medical History: Past Medical History: Diagnosis Date . Arthritis   trouble  turning head side to side and up . Diabetes mellitus without complication ([place])  . Hemorrhoids  . High cholesterol  . Hypertension  . Prostate cancer [facility])  Past Surgical History: Past Surgical History: Procedure Laterality Date . CHOLECYSTECTOMY N/A 11/11/2014  Procedure: LAPAROSCOPIC CHOLECYSTECTOMY WITH INTRAOPERATIVE CHOLANGIOGRAM;  Surgeon: Erroll Luna, MD;  Location: [place];  Service: General;  Laterality: N/A; . COLONOSCOPY N/A 11/09/2014  Procedure: COLONOSCOPY;  Surgeon: Juanita Craver, MD;  Location: [facility] ENDOSCOPY;  Service: Endoscopy;  Laterality: N/A; . CYSTOSCOPY WITH RETROGRADE PYELOGRAM, URETEROSCOPY AND STENT PLACEMENT Left 04/18/2014  Procedure: CYSTOSCOPY WITH RETROGRADE PYELOGRAM, URETEROSCOPY AND STENT PLACEMENT;  Surgeon: Raynelle Bring, MD;  Location: [facility];  Service: Urology;  Laterality: Left; . HOLMIUM LASER APPLICATION Left 5/78/4696  Procedure: HOLMIUM LASER APPLICATION;  Surgeon: Raynelle Bring, MD;  Location: [facility];  Service: Urology;  Laterality: Left; . LYMPHADENECTOMY  06/04/2012  Procedure: LYMPHADENECTOMY;  Surgeon: Dutch Gray, MD;  Location: [facility];  Service: Urology;  Laterality: Bilateral; . right  achilles tendon repair  yrs ago . ROBOT ASSISTED LAPAROSCOPIC RADICAL PROSTATECTOMY  06/04/2012  Procedure: ROBOTIC ASSISTED LAPAROSCOPIC RADICAL PROSTATECTOMY LEVEL 3;  Surgeon: Dutch Gray, MD;  Location: [facility];  Service: Urology;  Laterality: N/A; HPI: 74 year old male with past medical history of prostate cancer status post radical prostatectomy, insulin-dependent diabetes mellitus type 2, hypertension, gastroesophageal reflux disease, questionable history of COPD,  questionable history of diastolic ingestive heart failure and recent hospitalization for acute metabolic encephalopathy and acute hypercapnic/hypoxic respiratory failure who presents hospital emergency department from his skilled nursing facility with severe somnolence and hypoxia. Chest CT Left lower lobe opacity suspicious  for Bronchopneumonia. Consider aspiration. MBS 03/22/20 flash penetration, osteophytes impinging and narrowing pharyngeal space. Puree/thin recommended. BSE 04/15/20 recommended regular/nectar liquids and ST upgraded to thin liquid 2 days later and Dys 2.  Subjective: pt awake in chair Assessment / Plan / Recommendation CHL IP CLINICAL IMPRESSIONS 05/01/2020 Clinical Impression Pt presents with moderate oral and gross pharyngeal dysphagia which is much worse than prior MBS 03/2020. Minimal barium was provided due to aspiration and GROSS pharyngeal retention. Pharyngeal contraction, laryngeal elevation was minimal allowing gross retention without pt sensation.  Few boluses of thin and nectar consistencies provided.    Pt attempted to follow directions including "hocking" and dry swallowing, coughing  without effective clearance nor sensation.  Minimal clearance into esophagus noted with most aspirated or retained.  Pharynx remained with barium retention at end of study even after frequent posterior oral suctioning, "hocking" attempts and cough efforts.  His dysphagia is severe enough that he likely would not maintain nutrition and he will aspirate.  Given pt's diagnosis of dementia, multiple comorbidities, lack of evidence for feeding tube improving pt's with dementia outcomes, etc, feasible option in this SLPs opinion, is to allow po intake for comfort.  Will follow up. SLP Visit Diagnosis Dysphagia, oropharyngeal phase (R13.12) Attention and concentration deficit following -- Frontal lobe and executive function deficit following -- Impact on safety and function Moderate aspiration risk;Severe aspiration risk   CHL IP TREATMENT RECOMMENDATION 05/01/2020 Treatment Recommendations Therapy as outlined in treatment plan below   Prognosis 05/01/2020 Prognosis for Safe Diet Advancement Guarded Barriers to Reach Goals Severity of deficits Barriers/Prognosis Comment -- CHL IP DIET RECOMMENDATION 05/01/2020 SLP Diet Recommendations  NPO;Ice chips PRN after oral care Liquid Administration via -- Medication Administration Via alternative means Compensations -- Postural Changes --   CHL IP OTHER RECOMMENDATIONS 05/01/2020 Recommended Consults -- Oral Care Recommendations Oral care QID Other Recommendations --   CHL IP FOLLOW UP RECOMMENDATIONS 05/01/2020 Follow up Recommendations Skilled Nursing facility   [facility] IP FREQUENCY AND DURATION 05/01/2020 Speech Therapy Frequency (ACUTE ONLY) min 2x/week Treatment Duration 2 weeks      CHL IP ORAL PHASE 05/01/2020 Oral Phase Impaired Oral - Pudding Teaspoon -- Oral - Pudding Cup -- Oral - Honey Teaspoon -- Oral - Honey Cup -- Oral - Nectar Teaspoon Reduced posterior propulsion;Weak lingual manipulation Oral - Nectar Cup Weak lingual manipulation;Reduced posterior propulsion Oral - Nectar Straw -- Oral - Thin Teaspoon Weak lingual manipulation;Reduced posterior propulsion Oral - Thin Cup Reduced posterior propulsion;Weak lingual manipulation Oral - Thin Straw -- Oral - Puree -- Oral - Mech Soft -- Oral - Regular -- Oral - Multi-Consistency -- Oral - Pill -- Oral Phase - Comment --  CHL IP PHARYNGEAL PHASE 05/01/2020 Pharyngeal Phase Impaired Pharyngeal- Pudding Teaspoon -- Pharyngeal -- Pharyngeal- Pudding Cup -- Pharyngeal -- Pharyngeal- Honey Teaspoon -- Pharyngeal -- Pharyngeal- Honey Cup -- Pharyngeal -- Pharyngeal- Nectar Teaspoon Reduced tongue base retraction;Reduced airway/laryngeal closure;Reduced pharyngeal peristalsis;Reduced epiglottic inversion;Pharyngeal residue - valleculae;Pharyngeal residue - pyriform Pharyngeal Material does not enter airway Pharyngeal- Nectar Cup Reduced pharyngeal peristalsis;Reduced airway/laryngeal closure;Pharyngeal residue - valleculae;Reduced tongue base retraction;Reduced epiglottic inversion Pharyngeal Material does not enter airway Pharyngeal- Nectar Straw -- Pharyngeal -- Pharyngeal- Thin Teaspoon Reduced pharyngeal peristalsis;Reduced epiglottic inversion;Reduced  airway/laryngeal closure;Reduced tongue base retraction;Pharyngeal residue - valleculae Pharyngeal Material does not enter airway Pharyngeal- Thin Cup Reduced laryngeal elevation;Reduced pharyngeal peristalsis;Reduced epiglottic inversion;Reduced anterior laryngeal mobility;Reduced airway/laryngeal closure;Reduced tongue base retraction;Moderate aspiration;Penetration/Aspiration during swallow;Penetration/Apiration after swallow;Significant aspiration (Amount) Pharyngeal Material enters airway, passes BELOW cords without attempt by patient to eject out (silent aspiration);Material enters airway, passes BELOW cords and not ejected out despite cough attempt by patient Pharyngeal- Thin Straw -- Pharyngeal -- Pharyngeal- Puree -- Pharyngeal -- Pharyngeal- Mechanical Soft -- Pharyngeal -- Pharyngeal- Regular -- Pharyngeal -- Pharyngeal- Multi-consistency -- Pharyngeal -- Pharyngeal- Pill -- Pharyngeal -- Pharyngeal Comment --  CHL IP CERVICAL ESOPHAGEAL PHASE 05/01/2020 Cervical Esophageal Phase Impaired Pudding Teaspoon -- Pudding Cup -- Honey Teaspoon -- Honey Cup -- Nectar Teaspoon Reduced cricopharyngeal relaxation Nectar Cup Reduced cricopharyngeal relaxation Nectar Straw -- Thin Teaspoon Reduced cricopharyngeal relaxation Thin Cup Reduced cricopharyngeal relaxation Thin Straw -- Puree -- Mechanical Soft -- Regular -- Multi-consistency --  Pill -- Cervical Esophageal Comment -- Kathleen Lime, MS [facility] SLP Acute Rehab Services Office [phone] Macario Golds 05/01/2020, 3:47 PM              ECHOCARDIOGRAM COMPLETE  Result Date: 04/29/2020    ECHOCARDIOGRAM REPORT   Patient Name:   TRYPP HECKMANN Date of Exam: 04/29/2020 Medical Rec #:  [id number]     Height:       72.0 in Accession #:    [id number]    Weight:       212.5 lb Date of Birth:  Aug 24, 1945      BSA:          2.186 m Patient Age:    89 years      BP:           145/73 mmHg Patient Gender: M             HR:           90 bpm. Exam Location:  Inpatient Procedure:  2D Echo, Cardiac Doppler, Color Doppler and Intracardiac            Opacification Agent Indications:    CHF-Acute Systolic 643.32 / R51.88  History:        Patient has no prior history of Echocardiogram examinations.                 Risk Factors:Hypertension, Diabetes, Dyslipidemia and Former                 Smoker.  Sonographer:    Vickie Epley RDCS Referring Phys: [id number] [place]  Sonographer Comments: No subcostal window. Attempted definity but it did not work. IMPRESSIONS  1. Left ventricular ejection fraction, by estimation, is 60 to 65%. The left ventricle has normal function. The left ventricle has no regional wall motion abnormalities. Left ventricular diastolic parameters are indeterminate.  2. RV is not well visualized. Grossly appears moderately enlarged, mild to moderate systolic dysfunction. . Right ventricular systolic function was not well visualized. The right ventricular size is not well visualized.  3. The mitral valve is normal in structure. No evidence of mitral valve regurgitation. No evidence of mitral stenosis.  4. The aortic valve is tricuspid. There is mild calcification of the aortic valve. There is mild thickening of the aortic valve. Aortic valve regurgitation is not visualized. No aortic stenosis is present. FINDINGS  Left Ventricle: LVOT VTI appears inaccurate based on angle of acquisition. Left ventricular ejection fraction, by estimation, is 60 to 65%. The left ventricle has normal function. The left ventricle has no regional wall motion abnormalities. The left ventricular internal cavity size was normal in size. There is no left ventricular hypertrophy. Left ventricular diastolic parameters are indeterminate. Right Ventricle: RV is not well visualized. Grossly appears moderately enlarged, mild to moderate systolic dysfunction. The right ventricular size is not well visualized. Right vetricular wall thickness was not well visualized. Right ventricular systolic  function was  not well visualized. Left Atrium: Left atrial size was normal in size. Right Atrium: Right atrial size was not well visualized. Pericardium: There is no evidence of pericardial effusion. Mitral Valve: The mitral valve is normal in structure. No evidence of mitral valve regurgitation. No evidence of mitral valve stenosis. Tricuspid Valve: The tricuspid valve is normal in structure. Tricuspid valve regurgitation is not demonstrated. No evidence of tricuspid stenosis. Aortic Valve: The aortic valve is tricuspid. There is mild calcification of the aortic valve. There is mild thickening of the aortic  valve. There is mild aortic valve annular calcification. Aortic valve regurgitation is not visualized. No aortic stenosis  is present. Aortic valve mean gradient measures 1.4 mmHg. Aortic valve peak gradient measures 3.2 mmHg. Aortic valve area, by VTI measures 3.29 cm. Pulmonic Valve: The pulmonic valve was not well visualized. Pulmonic valve regurgitation is not visualized. No evidence of pulmonic stenosis. Aorta: The aortic root is normal in size and structure. Pulmonary Artery: Indeterminate PASP, inadequate TR jet. Venous: The inferior vena cava was not well visualized. IAS/Shunts: The interatrial septum was not well visualized.  LEFT VENTRICLE PLAX 2D LVIDd:         4.50 cm     Diastology LVIDs:         3.30 cm     LV e' medial:    8.05 cm/s LV PW:         0.70 cm     LV E/e' medial:  6.3 LV IVS:        0.70 cm     LV e' lateral:   9.14 cm/s LVOT diam:     2.30 cm     LV E/e' lateral: 5.5 LV SV:         42 LV SV Index:   19 LVOT Area:     4.15 cm  LV Volumes (MOD) LV vol d, MOD A4C: 74.7 ml LV vol s, MOD A4C: 33.8 ml LV SV MOD A4C:     74.7 ml RIGHT VENTRICLE RV S prime:     9.03 cm/s TAPSE (M-mode): 1.6 cm LEFT ATRIUM             Index       RIGHT ATRIUM           Index LA diam:        3.50 cm 1.60 cm/m  RA Area:     10.90 cm LA Vol (A2C):   18.7 ml 8.55 ml/m  RA Volume:   25.60 ml  11.71 ml/m LA Vol (A4C):    25.9 ml 11.85 ml/m LA Biplane Vol: 24.2 ml 11.07 ml/m  AORTIC VALVE AV Area (Vmax):    2.92 cm AV Area (Vmean):   3.43 cm AV Area (VTI):     3.29 cm AV Vmax:           89.29 cm/s AV Vmean:          54.930 cm/s AV VTI:            0.126 m AV Peak Grad:      3.2 mmHg AV Mean Grad:      1.4 mmHg LVOT Vmax:         62.70 cm/s LVOT Vmean:        45.300 cm/s LVOT VTI:          0.100 m LVOT/AV VTI ratio: 0.79  AORTA Ao Root diam: 3.10 cm MITRAL VALVE               TRICUSPID VALVE MV Area (PHT): 7.37 cm    TR Peak grad:   12.8 mmHg MV Decel Time: 103 msec    TR Vmax:        179.00 cm/s MV E velocity: 50.60 cm/s MV A velocity: 55.70 cm/s  SHUNTS MV E/A ratio:  0.91        Systemic VTI:  0.10 m  Systemic Diam: 2.30 cm Carlyle Dolly MD Electronically signed by Carlyle Dolly MD Signature Date/Time: 04/29/2020/11:49:16 AM    Final    VAS [place] LOWER EXTREMITY VENOUS (DVT)  Result Date: 04/29/2020  Lower Venous DVTStudy Indications: Hypoxia, elevated d-dimer.  Limitations: Body habitus, poor patient cooperation/position, poor ultrasound/tissue interface. Comparison Study: No prior study Performing Technologist: Maudry Mayhew MHA, RDMS, RVT, RDCS  Examination Guidelines: A complete evaluation includes B-mode imaging, spectral Doppler, color Doppler, and power Doppler as needed of all accessible portions of each vessel. Bilateral testing is considered an integral part of a complete examination. Limited examinations for reoccurring indications may be performed as noted. The reflux portion of the exam is performed with the patient in reverse Trendelenburg.  +---------+---------------+---------+-----------+----------+--------------+ RIGHT    CompressibilityPhasicitySpontaneityPropertiesThrombus Aging +---------+---------------+---------+-----------+----------+--------------+ CFV      None           Yes      Yes                  Acute mobile    +---------+---------------+---------+-----------+----------+--------------+ SFJ      Full                                                        +---------+---------------+---------+-----------+----------+--------------+ FV Prox                          Yes                                 +---------+---------------+---------+-----------+----------+--------------+ FV Mid                           Yes                                 +---------+---------------+---------+-----------+----------+--------------+ FV Distal                        Yes                                 +---------+---------------+---------+-----------+----------+--------------+ PFV                              No                   Acute          +---------+---------------+---------+-----------+----------+--------------+ POP                     Yes      Yes                                 +---------+---------------+---------+-----------+----------+--------------+ PTV                              No                   Acute          +---------+---------------+---------+-----------+----------+--------------+  Right Technical Findings: Not visualized segments include peroneal veins.  +---------+---------------+---------+-----------+----------+--------------+ LEFT     CompressibilityPhasicitySpontaneityPropertiesThrombus Aging +---------+---------------+---------+-----------+----------+--------------+ CFV      Full           Yes      Yes                                 +---------+---------------+---------+-----------+----------+--------------+ SFJ      Full                                                        +---------+---------------+---------+-----------+----------+--------------+ FV Prox  Full                                                        +---------+---------------+---------+-----------+----------+--------------+ FV Mid   Full                                                         +---------+---------------+---------+-----------+----------+--------------+ FV DistalFull                                                        +---------+---------------+---------+-----------+----------+--------------+ PFV      Full                                                        +---------+---------------+---------+-----------+----------+--------------+ POP      Full           Yes      Yes                                 +---------+---------------+---------+-----------+----------+--------------+   Left Technical Findings: Not visualized segments include PTV, peroneal veins.   Summary: RIGHT: - Findings consistent with acute deep vein thrombosis involving the right common femoral vein, and right posterior tibial vein. Thrombus in right common femoral vein is mobile. - No cystic structure found in the popliteal fossa.  LEFT: - There is no evidence of deep vein thrombosis in the lower extremity. However, portions of this examination were limited- see technologist comments above.  - No cystic structure found in the popliteal fossa.  *See table(s) above for measurements and observations. Electronically signed by Harold Barban MD on 04/29/2020 at 1:04:02 PM.    Final    VAS [place] UPPER EXTREMITY VENOUS DUPLEX  Result Date: 04/29/2020 UPPER VENOUS STUDY  Indications: Edema, and Hypoxia, elevated d-dimer Limitations: Body habitus, poor ultrasound/tissue interface, patient position/contracture, poor patient cooperation. Comparison Study: No prior study Performing Technologist: Maudry Mayhew MHA, RDMS, RVT, RDCS  Examination Guidelines:  A complete evaluation includes B-mode imaging, spectral Doppler, color Doppler, and power Doppler as needed of all accessible portions of each vessel. Bilateral testing is considered an integral part of a complete examination. Limited examinations for reoccurring indications may be performed as noted.  Right Findings:  +----------+------------+---------+-----------+----------+-------+ RIGHT     CompressiblePhasicitySpontaneousPropertiesSummary +----------+------------+---------+-----------+----------+-------+ IJV           Full       Yes       Yes                      +----------+------------+---------+-----------+----------+-------+ Subclavian    Full       Yes       Yes                      +----------+------------+---------+-----------+----------+-------+ Axillary      Full       Yes       Yes                      +----------+------------+---------+-----------+----------+-------+ Brachial      Full       Yes       Yes                      +----------+------------+---------+-----------+----------+-------+ Radial        Full                                          +----------+------------+---------+-----------+----------+-------+ Cephalic      Full                                          +----------+------------+---------+-----------+----------+-------+ Basilic       Full                                          +----------+------------+---------+-----------+----------+-------+ Unable to visualize right ulnar veins due to technical limitations  Left Findings: +----------+------------+---------+-----------+----------+-------+ LEFT      CompressiblePhasicitySpontaneousPropertiesSummary +----------+------------+---------+-----------+----------+-------+ IJV           Full       Yes       Yes                      +----------+------------+---------+-----------+----------+-------+ Subclavian    Full       Yes       Yes                      +----------+------------+---------+-----------+----------+-------+ Radial        Full                                          +----------+------------+---------+-----------+----------+-------+ Unable to visualize left axillary, brachial, basilic, cephalic, and ulnar veins due to technical limitations.  Summary:   Right: No evidence of deep vein thrombosis in the upper extremity. No evidence of superficial vein thrombosis in the upper extremity.  Left: No obvious evidence of deep vein thrombosis involving the visualized veins of the upper extremity.  *  See table(s) above for measurements and observations.  Diagnosing physician: Harold Barban MD Electronically signed by Harold Barban MD on 04/29/2020 at 1:04:23 PM.    Final      Subjective: Eager to return to facility  Discharge Exam: Vitals:   05/05/20 0952 05/05/20 1200  BP:  115/68  Pulse:  98  Resp: 16   Temp:    SpO2:     Vitals:   05/05/20 0419 05/05/20 0944 05/05/20 0952 05/05/20 1200  BP: 110/70 (!) 97/51  115/68  Pulse: (!) 105 98  98  Resp: $Remo'15 17 16   'EQscW$ Temp: 98.5 F (36.9 C) 97.8 F (36.6 C)    TempSrc: Oral Oral    SpO2: 100% 97%    Weight:      Height:        General: Pt is alert, awake, not in acute distress Cardiovascular: RRR, S1/S2 +, no rubs, no gallops Respiratory: CTA bilaterally, no wheezing, no rhonchi Abdominal: Soft, NT, ND, bowel sounds + Extremities: no edema, no cyanosis   The results of significant diagnostics from this hospitalization (including imaging, microbiology, ancillary and laboratory) are listed below for reference.     Microbiology: Recent Results (from the past 240 hour(s))  Blood culture (routine x 2)     Status: None   Collection Time: 04/28/20 12:30 PM   Specimen: BLOOD  Result Value Ref Range Status   Specimen Description   Final    BLOOD RIGHT ARM UPPER Performed at [facility] [address], [place], [place] [address]    Special Requests   Final    BOTTLES DRAWN AEROBIC ONLY Blood Culture results may not be optimal due to an inadequate volume of blood received in culture bottles Performed at [facility] [address] [place], [place] [address]    Culture   Final    NO GROWTH 5 DAYS Performed at [facility], [place] [address]  [place], [place] [address]    Report Status 05/03/2020 FINAL  Final  Blood culture (routine x 2)     Status: None   Collection Time: 04/28/20 12:41 PM   Specimen: BLOOD  Result Value Ref Range Status   Specimen Description   Final    BLOOD LEFT ARM UPPER Performed at [facility] [address], [place], [place] [address]    Special Requests   Final    BOTTLES DRAWN AEROBIC AND ANAEROBIC Blood Culture adequate volume Performed at [facility] [address], [place],  [address]    Culture   Final    NO GROWTH 5 DAYS Performed at [facility], [place] [address] [place],  [address]    Report Status 05/03/2020 FINAL  Final  Respiratory Panel by RT PCR (Flu A&B, Covid) - Nasopharyngeal Swab     Status: None   Collection Time: 04/28/20  1:21 PM   Specimen: Nasopharyngeal Swab  Result Value Ref Range Status   SARS Coronavirus 2 by RT PCR NEGATIVE NEGATIVE Final    Comment: (NOTE) SARS-CoV-2 target nucleic acids are NOT DETECTED.  The SARS-CoV-2 RNA is generally detectable in upper respiratoy specimens during the acute phase of infection. The lowest concentration of SARS-CoV-2 viral copies this assay can detect is 131 copies/mL. A negative result does not preclude SARS-Cov-2 infection and should not be used as the sole basis for treatment or other patient management decisions. A negative result may occur with  improper specimen collection/handling, submission  of specimen other than nasopharyngeal swab, presence of viral mutation(s) within the areas targeted by this assay, and inadequate number of viral copies (<131 copies/mL). A negative result must be combined with clinical observations, patient history, and epidemiological information. The expected result is Negative.  Fact Sheet for Patients:  PinkCheek.be  Fact Sheet for Healthcare Providers:  GravelBags.it  This  test is no t yet approved or cleared by the [place] FDA and  has been authorized for detection and/or diagnosis of SARS-CoV-2 by FDA under an Emergency Use Authorization (EUA). This EUA will remain  in effect (meaning this test can be used) for the duration of the COVID-19 declaration under Section 564(b)(1) of the Act, 21 U.S.C. section 360bbb-3(b)(1), unless the authorization is terminated or revoked sooner.     Influenza A by PCR NEGATIVE NEGATIVE Final   Influenza B by PCR NEGATIVE NEGATIVE Final    Comment: (NOTE) The Xpert Xpress SARS-CoV-2/FLU/RSV assay is intended as an aid in  the diagnosis of influenza from Nasopharyngeal swab specimens and  should not be used as a sole basis for treatment. Nasal washings and  aspirates are unacceptable for Xpert Xpress SARS-CoV-2/FLU/RSV  testing.  Fact Sheet for Patients: PinkCheek.be  Fact Sheet for Healthcare Providers: GravelBags.it  This test is not yet approved or cleared by the [place] FDA and  has been authorized for detection and/or diagnosis of SARS-CoV-2 by  FDA under an Emergency Use Authorization (EUA). This EUA will remain  in effect (meaning this test can be used) for the duration of the  Covid-19 declaration under Section 564(b)(1) of the Act, 21  U.S.C. section 360bbb-3(b)(1), unless the authorization is  terminated or revoked. Performed at [facility], [place] [address] [place], [place] [address]   Culture, Urine     Status: Abnormal   Collection Time: 04/28/20  8:16 PM   Specimen: Urine, Catheterized  Result Value Ref Range Status   Specimen Description   Final    URINE, CATHETERIZED Performed at [facility] [address], [place], [place] [address]    Special Requests   Final    NONE Performed at [facility], [place] [address] [place], [place] [address]    Culture >=100,000  COLONIES/mL SERRATIA MARCESCENS (A)  Final   Report Status 05/01/2020 FINAL  Final   Organism ID, Bacteria SERRATIA MARCESCENS (A)  Final      Susceptibility   Serratia marcescens - MIC*    CEFAZOLIN >=64 RESISTANT Resistant     CEFTRIAXONE <=0.25 SENSITIVE Sensitive     CIPROFLOXACIN <=0.25 SENSITIVE Sensitive     GENTAMICIN <=1 SENSITIVE Sensitive     NITROFURANTOIN 128 RESISTANT Resistant     TRIMETH/SULFA <=20 SENSITIVE Sensitive     * >=100,000 COLONIES/mL SERRATIA MARCESCENS  MRSA PCR Screening     Status: None   Collection Time: 04/30/20  8:21 PM   Specimen: Nasal Mucosa; Nasopharyngeal  Result Value Ref Range Status   MRSA by PCR NEGATIVE NEGATIVE Final    Comment:        The GeneXpert MRSA Assay (FDA approved for NASAL specimens only), is one component of a comprehensive MRSA colonization surveillance program. It is not intended to diagnose MRSA infection nor to guide or monitor treatment for MRSA infections. Performed at [facility], [place] [address] [place], [place] [address]   SARS Coronavirus 2 by RT PCR (hospital order, performed in [facility] hospital lab) Nasopharyngeal Nasopharyngeal Swab  Status: None   Collection Time: 05/04/20  2:32 PM   Specimen: Nasopharyngeal Swab  Result Value Ref Range Status   SARS Coronavirus 2 NEGATIVE NEGATIVE Final    Comment: (NOTE) SARS-CoV-2 target nucleic acids are NOT DETECTED.  The SARS-CoV-2 RNA is generally detectable in upper and lower respiratory specimens during the acute phase of infection. The lowest concentration of SARS-CoV-2 viral copies this assay can detect is 250 copies / mL. A negative result does not preclude SARS-CoV-2 infection and should not be used as the sole basis for treatment or other patient management decisions.  A negative result may occur with improper specimen collection / handling, submission of specimen other than nasopharyngeal swab, presence of viral mutation(s)  within the areas targeted by this assay, and inadequate number of viral copies (<250 copies / mL). A negative result must be combined with clinical observations, patient history, and epidemiological information.  Fact Sheet for Patients:   StrictlyIdeas.no  Fact Sheet for Healthcare Providers: BankingDealers.co.za  This test is not yet approved or  cleared by the [place] FDA and has been authorized for detection and/or diagnosis of SARS-CoV-2 by FDA under an Emergency Use Authorization (EUA).  This EUA will remain in effect (meaning this test can be used) for the duration of the COVID-19 declaration under Section 564(b)(1) of the Act, 21 U.S.C. section 360bbb-3(b)(1), unless the authorization is terminated or revoked sooner.  Performed at [facility], [place] [address] [place], [place] [address]      Labs: BNP (last 3 results) Recent Labs    12/24/19 2100 04/18/20 0420 04/28/20 1215  BNP 57.8 146.8* 944.9*   Basic Metabolic Panel: Recent Labs  Lab 04/28/20 2134 04/28/20 2134 04/29/20 0910 04/29/20 0910 04/30/20 0226 05/01/20 0055 05/02/20 0404 05/03/20 0526 05/04/20 0510  NA 138   < > 139   < > 139  140 140 143 142 142  K 5.1   < > 5.0   < > 4.8  4.8 4.6 4.9 4.3 4.3  CL 90*   < > 93*   < > 94*  94* 97* 98 101 103  CO2 38*   < > 37*   < > 34*  35* 33* 34* 33* 33*  GLUCOSE 248*   < > 100*   < > 73  71 209* 184* 206* 207*  BUN 26*   < > 27*   < > 26*  27* 25* 24* 20 18  CREATININE 2.12*   < > 1.73*   < > 1.45*  1.44* 1.05 0.89 0.82 0.77  CALCIUM 9.0   < > 8.6*   < > 8.1*  8.3* 8.1* 8.1* 7.9* 8.3*  MG  --   --  1.7  --  1.5* 2.3 2.2 1.9  --   PHOS 7.1*  --   --   --  3.9 2.2* 2.4* 1.4*  --    < > = values in this interval not displayed.   Liver Function Tests: Recent Labs  Lab 04/29/20 0910 04/29/20 0910 04/30/20 0226 05/01/20 0055 05/02/20 0404 05/03/20 0526 05/04/20 0510  AST  47*  --  39  --   --   --  20  ALT 45*  --  39  --   --   --  25  ALKPHOS 83  --  79  --   --   --  80  BILITOT 0.7  --  0.8  --   --   --  0.6  PROT 6.1*  --  5.2*  --   --   --  4.9*  ALBUMIN 3.1*   < > 2.7*  2.7* 2.5* 2.5* 2.3* 2.5*   < > = values in this interval not displayed.   No results for input(s): LIPASE, AMYLASE in the last 168 hours. No results for input(s): AMMONIA in the last 168 hours. CBC: Recent Labs  Lab 04/29/20 0505 04/29/20 0505 04/30/20 0226 05/01/20 0055 05/02/20 0404 05/03/20 0526 05/04/20 0510  WBC 13.2*   < > 7.2 8.6 7.6 4.8 5.7  NEUTROABS 10.4*  --   --   --   --   --   --   HGB 11.5*   < > 10.6* 10.0* 9.6* 8.1* 8.5*  HCT 38.9*   < > 33.7* 32.5* 32.0* 25.5* 27.0*  MCV 97.5   < > 92.1 94.5 95.5 91.4 90.9  PLT 152   < > 126* 114* 109* 125* 133*   < > = values in this interval not displayed.   Cardiac Enzymes: No results for input(s): CKTOTAL, CKMB, CKMBINDEX, TROPONINI in the last 168 hours. BNP: Invalid input(s): POCBNP CBG: Recent Labs  Lab 05/04/20 1129 05/04/20 1636 05/04/20 2015 05/05/20 0742 05/05/20 1131  GLUCAP 163* 162* 176* 165* 235*   D-Dimer No results for input(s): DDIMER in the last 72 hours. Hgb A1c Recent Labs    05/03/20 0526  HGBA1C 7.8*   Lipid Profile No results for input(s): CHOL, HDL, LDLCALC, TRIG, CHOLHDL, LDLDIRECT in the last 72 hours. Thyroid function studies No results for input(s): TSH, T4TOTAL, T3FREE, THYROIDAB in the last 72 hours.  Invalid input(s): FREET3 Anemia work up Recent Labs    05/03/20 0526  VITAMINB12 522  FOLATE 11.0  FERRITIN 253  TIBC 158*  IRON 52  RETICCTPCT 1.9   Urinalysis    Component Value Date/Time   COLORURINE AMBER (A) 04/28/2020 2016   APPEARANCEUR CLOUDY (A) 04/28/2020 2016   LABSPEC 1.020 04/28/2020 2016   PHURINE 5.0 04/28/2020 2016   GLUCOSEU NEGATIVE 04/28/2020 2016   HGBUR NEGATIVE 04/28/2020 2016   BILIRUBINUR NEGATIVE 04/28/2020 2016   KETONESUR  NEGATIVE 04/28/2020 2016   PROTEINUR 100 (A) 04/28/2020 2016   UROBILINOGEN 0.2 11/05/2014 1712   NITRITE NEGATIVE 04/28/2020 2016   LEUKOCYTESUR LARGE (A) 04/28/2020 2016   Sepsis Labs Invalid input(s): PROCALCITONIN,  WBC,  LACTICIDVEN Microbiology Recent Results (from the past 240 hour(s))  Blood culture (routine x 2)     Status: None   Collection Time: 04/28/20 12:30 PM   Specimen: BLOOD  Result Value Ref Range Status   Specimen Description   Final    BLOOD RIGHT ARM UPPER Performed at [facility], [place] [address], [place], [place] [address]    Special Requests   Final    BOTTLES DRAWN AEROBIC ONLY Blood Culture results may not be optimal due to an inadequate volume of blood received in culture bottles Performed at [place] [address], [place], [place] [address]    Culture   Final    NO GROWTH 5 DAYS Performed at [facility], [place] [address], [place], [place] [address]    Report Status 05/03/2020 FINAL  Final  Blood culture (routine x 2)     Status: None   Collection Time: 04/28/20 12:41 PM   Specimen: BLOOD  Result Value Ref Range Status   Specimen Description   Final    BLOOD LEFT ARM UPPER Performed at  [facility], [place] [address], [place], [place] [address]    Special Requests   Final    BOTTLES DRAWN AEROBIC AND ANAEROBIC Blood Culture adequate volume Performed at [place] [address], [place], [place] [address]    Culture   Final    NO GROWTH 5 DAYS Performed at [facility], [place] [address] [place], [place] [address]    Report Status 05/03/2020 FINAL  Final  Respiratory Panel by RT PCR (Flu A&B, Covid) - Nasopharyngeal Swab     Status: None   Collection Time: 04/28/20  1:21 PM   Specimen: Nasopharyngeal Swab  Result Value Ref Range Status   SARS Coronavirus 2 by RT PCR NEGATIVE NEGATIVE Final    Comment: (NOTE) SARS-CoV-2 target nucleic acids are NOT  DETECTED.  The SARS-CoV-2 RNA is generally detectable in upper respiratoy specimens during the acute phase of infection. The lowest concentration of SARS-CoV-2 viral copies this assay can detect is 131 copies/mL. A negative result does not preclude SARS-Cov-2 infection and should not be used as the sole basis for treatment or other patient management decisions. A negative result may occur with  improper specimen collection/handling, submission of specimen other than nasopharyngeal swab, presence of viral mutation(s) within the areas targeted by this assay, and inadequate number of viral copies (<131 copies/mL). A negative result must be combined with clinical observations, patient history, and epidemiological information. The expected result is Negative.  Fact Sheet for Patients:  PinkCheek.be  Fact Sheet for Healthcare Providers:  GravelBags.it  This test is no t yet approved or cleared by the [place] FDA and  has been authorized for detection and/or diagnosis of SARS-CoV-2 by FDA under an Emergency Use Authorization (EUA). This EUA will remain  in effect (meaning this test can be used) for the duration of the COVID-19 declaration under Section 564(b)(1) of the Act, 21 U.S.C. section 360bbb-3(b)(1), unless the authorization is terminated or revoked sooner.     Influenza A by PCR NEGATIVE NEGATIVE Final   Influenza B by PCR NEGATIVE NEGATIVE Final    Comment: (NOTE) The Xpert Xpress SARS-CoV-2/FLU/RSV assay is intended as an aid in  the diagnosis of influenza from Nasopharyngeal swab specimens and  should not be used as a sole basis for treatment. Nasal washings and  aspirates are unacceptable for Xpert Xpress SARS-CoV-2/FLU/RSV  testing.  Fact Sheet for Patients: PinkCheek.be  Fact Sheet for Healthcare Providers: GravelBags.it  This test is not yet  approved or cleared by the [place] FDA and  has been authorized for detection and/or diagnosis of SARS-CoV-2 by  FDA under an Emergency Use Authorization (EUA). This EUA will remain  in effect (meaning this test can be used) for the duration of the  Covid-19 declaration under Section 564(b)(1) of the Act, 21  U.S.C. section 360bbb-3(b)(1), unless the authorization is  terminated or revoked. Performed at [facility], [place] [address], [place], [place] [address]   Culture, Urine     Status: Abnormal   Collection Time: 04/28/20  8:16 PM   Specimen: Urine, Catheterized  Result Value Ref Range Status   Specimen Description   Final    URINE, CATHETERIZED Performed at [place] [address], [place], [place] [address]    Special Requests   Final    NONE Performed at [facility], [place] [address], [place], [place] [address]    Culture >=100,000 COLONIES/mL SERRATIA MARCESCENS (A)  Final  Report Status 05/01/2020 FINAL  Final   Organism ID, Bacteria SERRATIA MARCESCENS (A)  Final      Susceptibility   Serratia marcescens - MIC*    CEFAZOLIN >=64 RESISTANT Resistant     CEFTRIAXONE <=0.25 SENSITIVE Sensitive     CIPROFLOXACIN <=0.25 SENSITIVE Sensitive     GENTAMICIN <=1 SENSITIVE Sensitive     NITROFURANTOIN 128 RESISTANT Resistant     TRIMETH/SULFA <=20 SENSITIVE Sensitive     * >=100,000 COLONIES/mL SERRATIA MARCESCENS  MRSA PCR Screening     Status: None   Collection Time: 04/30/20  8:21 PM   Specimen: Nasal Mucosa; Nasopharyngeal  Result Value Ref Range Status   MRSA by PCR NEGATIVE NEGATIVE Final    Comment:        The GeneXpert MRSA Assay (FDA approved for NASAL specimens only), is one component of a comprehensive MRSA colonization surveillance program. It is not intended to diagnose MRSA infection nor to guide or monitor treatment for MRSA infections. Performed at [facility], [place]  [address] [place], [place] [address]   SARS Coronavirus 2 by RT PCR (hospital order, performed in [facility] hospital lab) Nasopharyngeal Nasopharyngeal Swab     Status: None   Collection Time: 05/04/20  2:32 PM   Specimen: Nasopharyngeal Swab  Result Value Ref Range Status   SARS Coronavirus 2 NEGATIVE NEGATIVE Final    Comment: (NOTE) SARS-CoV-2 target nucleic acids are NOT DETECTED.  The SARS-CoV-2 RNA is generally detectable in upper and lower respiratory specimens during the acute phase of infection. The lowest concentration of SARS-CoV-2 viral copies this assay can detect is 250 copies / mL. A negative result does not preclude SARS-CoV-2 infection and should not be used as the sole basis for treatment or other patient management decisions.  A negative result may occur with improper specimen collection / handling, submission of specimen other than nasopharyngeal swab, presence of viral mutation(s) within the areas targeted by this assay, and inadequate number of viral copies (<250 copies / mL). A negative result must be combined with clinical observations, patient history, and epidemiological information.  Fact Sheet for Patients:   StrictlyIdeas.no  Fact Sheet for Healthcare Providers: BankingDealers.co.za  This test is not yet approved or  cleared by the [place] FDA and has been authorized for detection and/or diagnosis of SARS-CoV-2 by FDA under an Emergency Use Authorization (EUA).  This EUA will remain in effect (meaning this test can be used) for the duration of the COVID-19 declaration under Section 564(b)(1) of the Act, 21 U.S.C. section 360bbb-3(b)(1), unless the authorization is terminated or revoked sooner.  Performed at [facility], [place] [address] [place], [place] [address]    Time spent: 30 min  SIGNED:   Marylu Lund, MD  [facility] 05/05/2020, 1:33 PM  If 7PM-7AM,  please contact night-coverage

## 2020-05-05 NOTE — Progress Notes (Signed)
Daily Progress Note   Patient Name: Jay Holloway       Date: 05/05/2020 DOB: 1946-03-13  Age: 74 y.o. MRN#: [id number] Attending Physician: No att. providers found Primary Care Physician: [name], MD Admit Date: 04/28/2020  Reason for Consultation/Follow-up: Establishing goals of care  Subjective: I saw and examined Jay Holloway.  He was awake and alert in no distress.  We discussed again regarding advanced care planning and recommendations for completion of healthcare power of attorney as well as MOST form.  He has a copy of both of these on his tray table and reports he is going to review them on return back to skilled facility where he has his glasses.  He is agreeable to palliative care follow-up as an outpatient to continue conversation.   Length of Stay: 7  Current Medications: Scheduled Meds:  . apixaban  10 mg Oral BID   Followed by  . [START ON 05/10/2020] apixaban  5 mg Oral BID  . Chlorhexidine Gluconate Cloth  6 each Topical Daily  . collagenase   Topical Daily  . insulin aspart  0-5 Units Subcutaneous QHS  . insulin aspart  0-9 Units Subcutaneous TID WC  . mouth rinse  15 mL Mouth Rinse BID  . pantoprazole  40 mg Oral BID  . sodium chloride flush  10-40 mL Intracatheter Q12H    Continuous Infusions:   PRN Meds: acetaminophen **OR** [DISCONTINUED] acetaminophen, albuterol, haloperidol lactate, hydrALAZINE, ondansetron **OR** ondansetron (ZOFRAN) IV, sodium chloride flush, traMADol  Physical Exam       General: Alert, awake, in no acute distress. Chronically ill-appearing. HEENT: No bruits, no goiter, no JVD Heart: Tachycardic. No murmur appreciated. Lungs: Decreased air movement, scattered coarse Abdomen: Soft, nontender, nondistended, positive bowel sounds.   Ext: No significant edema Skin: Warm and dry Neuro: Grossly intact, nonfocal.  Vital Signs: BP 107/64 (BP Location: Left Arm)   Pulse 96   Temp 98.2 F (36.8 C) (Oral)   Resp 16   Ht 6' (1.829 m)   Wt 96.4 kg   SpO2 94%   BMI 28.82 kg/m  SpO2: SpO2: 94 % O2 Device: O2 Device: Nasal Cannula O2 Flow Rate: O2 Flow Rate (L/min): 2 L/min  Intake/output summary:  No intake or output data in the 24 hours ending 05/05/20 1811 LBM: Last BM Date:  05/04/20 Baseline Weight: Weight: 96.4 kg Most recent weight: Weight: 96.4 kg       Palliative Assessment/Data: 30%   Flowsheet Rows     Most Recent Value  Intake Tab  Referral Department Hospitalist  Unit at Time of Referral ER  Palliative Care Primary Diagnosis Pulmonary  Date Notified 04/29/20  Palliative Care Type New Palliative care  Reason for referral Clarify Goals of Care  Date of Admission 04/28/20  Date first seen by Palliative Care 04/29/20  # of days Palliative referral response time 0 Day(s)  # of days IP prior to Palliative referral 1  Clinical Assessment  Psychosocial & Spiritual Assessment  Palliative Care Outcomes      Patient Active Problem List   Diagnosis Date Noted  . Hypomagnesemia 04/28/2020  . Mixed diabetic hyperlipidemia associated with type 2 diabetes mellitus ([place]) 04/28/2020  . Pressure injury of sacral region, stage 2 ([place]) 04/19/2020  . Acute diastolic CHF (congestive heart failure) ([place]) 04/19/2020  . Acute respiratory failure with hypoxia and hypercarbia ([place]) 04/17/2020  . Hypernatremia 04/17/2020  . Dysphagia 04/17/2020  . Acute metabolic encephalopathy 76/73/4193  . CKD (chronic kidney disease), stage II 04/17/2020  . Hyperkalemia 04/14/2020  . GERD without esophagitis 04/14/2020  . Slurred speech 04/14/2020  . Generalized weakness 04/14/2020  . AKI (acute kidney injury) ([place]) 03/21/2020  . Prostate cancer ([place])   . Essential hypertension   . Arterial hypotension   . Renal  failure   . Type 2 diabetes mellitus with hyperglycemia, with long-term current use of insulin ([place])   . Weakness   . DKA (diabetic ketoacidoses) 11/05/2014    Palliative Care Assessment & Plan   Patient Profile:  74 year old gentleman brought in from nursing facility.  Has past medical history of diabetes hypertension diastolic heart failure chronic kidney disease prostate cancer.  He was hospitalized towards the end of September for possible DKA.  He was discharged to skilled nursing facility.  He has been admitted with hypercapnic respiratory failure.  Off-and-on he has required BiPAP.  Off-and-on he has had encephalopathy.  PCCM has also been following.  Palliative consultation for goals of care discussions has been requested  Assessment: Found to have acute pulmonary embolism and pneumonia on CTA Time-limited trial of current interventions to see if we can make any progress towards meaningful recovery are being done.    Recommendations/Plan: Jay Holloway was awake, alert, and fully able to participate in conversation.  We reviewed a MOST form discussed outlining care plan that would focus on interventions that are likely to allow him to spend quality time outside of the hospital while having things to eat despite risk for aspiration. He reports being agreeable to completion of MOST form at some point in time, however, he does not have his glasses here at the hospital and wants to be over read form prior to signing.  This certainly seems reasonable to me. Recommend outpatient palliative care to follow on transition back to facility to work on completion of advance directives including MOST form.  He will also need someone to help him establish healthcare power of attorney paperwork as this was not completed prior to discharge.   Code Status:    Code Status Orders  (From admission, onward)         Start     Ordered   04/28/20 1938  Do not attempt resuscitation (DNR)  Continuous        Question Answer Comment  In the event of cardiac or  respiratory ARREST Do not call a "code blue"   In the event of cardiac or respiratory ARREST Do not perform Intubation, CPR, defibrillation or ACLS   In the event of cardiac or respiratory ARREST Use medication by any route, position, wound care, and other measures to relive pain and suffering. May use oxygen, suction and manual treatment of airway obstruction as needed for comfort.      04/28/20 1937        Code Status History    Date Active Date Inactive Code Status Order ID Comments User Context   04/28/2020 1847 04/28/2020 1937 DNR [id number]  Vernelle Emerald, MD ED   04/14/2020 1839 04/19/2020 2255 DNR [id number]  Mckinley Jewel, MD ED   04/14/2020 1836 04/14/2020 1839 DNR [id number]  Mckinley Jewel, MD ED   03/21/2020 1559 03/25/2020 0542 Full Code [id number]  Karmen Bongo, MD ED   11/05/2014 1947 11/07/2014 1106 Full Code [id number]  Crecencio Mc, MD Inpatient   11/05/2014 1942 11/05/2014 1947 Full Code [id number]  Crecencio Mc, MD ED   Advance Care Planning Activity    Advance Directive Documentation     Most Recent Value  Type of Advance Directive Out of facility DNR (pink MOST or yellow form)  Pre-existing out of facility DNR order (yellow form or pink MOST form) Pink MOST/Yellow Form most recent copy in chart - Physician notified to receive inpatient order  "MOST" Form in Place? --       Discharge Planning: Back to [facility] health care with recommendation for outpatient palliative follow-up  Care plan was discussed with IDT  Thank you for allowing the Palliative Medicine Team to assist in the care of this patient.   Total Time 20 Prolonged Time Billed  no    Greater than 50%  of this time was spent counseling and coordinating care related to the above assessment and plan.   Micheline Rough, MD  Please contact Palliative Medicine Team phone at [phone] for questions and concerns.

## 2020-05-05 NOTE — TOC Transition Note (Signed)
Transition of Care [facility]) - CM/SW Discharge Note   Patient Details  Name: Jay Holloway MRN: [id number] Date of Birth: February 23, 1946  [facility] of [facility] CM/SW Contact:  Lennart Pall, LCSW Phone Number: 05/05/2020, 2:09 PM   Clinical Narrative:    Pt cleared for SNF return today. Pt and nephew aware and agreeable.  [facility]AR called for transport.  RN to call report to [employer] at [phone] and pt going to room # 112.  No further TOC needs.   Final next level of care: Skilled Nursing Facility Barriers to Discharge: Barriers Resolved   Patient Goals and CMS Choice        Discharge Placement              Patient chooses bed at: [facility] Patient to be transferred to facility by: [place] Name of family member notified: nephew, Dorathy Daft Patient and family notified of of transfer: 05/05/20  Discharge Plan and Services                DME Arranged: N/A DME Agency: NA       HH Arranged: NA HH Agency: NA        Social Determinants of Health (SDOH) Interventions     Readmission Risk Interventions No flowsheet data found.

## 2020-05-09 DIAGNOSIS — G8929 Other chronic pain: Secondary | ICD-10-CM | POA: Diagnosis not present

## 2020-05-09 DIAGNOSIS — M79661 Pain in right lower leg: Secondary | ICD-10-CM | POA: Diagnosis not present

## 2020-05-09 DIAGNOSIS — R112 Nausea with vomiting, unspecified: Secondary | ICD-10-CM | POA: Diagnosis not present

## 2020-05-09 DIAGNOSIS — R634 Abnormal weight loss: Secondary | ICD-10-CM | POA: Diagnosis not present

## 2020-05-09 DIAGNOSIS — M79662 Pain in left lower leg: Secondary | ICD-10-CM | POA: Diagnosis not present

## 2020-05-09 DIAGNOSIS — R627 Adult failure to thrive: Secondary | ICD-10-CM | POA: Diagnosis not present

## 2020-05-09 DIAGNOSIS — R1312 Dysphagia, oropharyngeal phase: Secondary | ICD-10-CM | POA: Diagnosis not present

## 2020-05-09 DIAGNOSIS — R197 Diarrhea, unspecified: Secondary | ICD-10-CM | POA: Diagnosis not present

## 2020-05-10 ENCOUNTER — Other Ambulatory Visit: Payer: Self-pay

## 2020-05-10 ENCOUNTER — Encounter (HOSPITAL_COMMUNITY): Payer: Self-pay

## 2020-05-10 ENCOUNTER — Inpatient Hospital Stay (HOSPITAL_COMMUNITY)
Admission: EM | Admit: 2020-05-10 | Discharge: 2020-05-19 | Disposition: A | Discharge status: Skilled Nursing Facility | Payer: Medicare HMO | Source: Skilled Nursing Facility | Attending: Internal Medicine | Admitting: Internal Medicine

## 2020-05-10 ENCOUNTER — Emergency Department (HOSPITAL_COMMUNITY): Payer: Medicare HMO

## 2020-05-10 DIAGNOSIS — J9601 Acute respiratory failure with hypoxia: Principal | ICD-10-CM

## 2020-05-10 DIAGNOSIS — F05 Delirium due to known physiological condition: Secondary | ICD-10-CM | POA: Diagnosis not present

## 2020-05-10 DIAGNOSIS — R531 Weakness: Secondary | ICD-10-CM | POA: Diagnosis not present

## 2020-05-10 DIAGNOSIS — Z515 Encounter for palliative care: Secondary | ICD-10-CM | POA: Diagnosis not present

## 2020-05-10 DIAGNOSIS — E875 Hyperkalemia: Secondary | ICD-10-CM | POA: Diagnosis present

## 2020-05-10 DIAGNOSIS — Z7901 Long term (current) use of anticoagulants: Secondary | ICD-10-CM

## 2020-05-10 DIAGNOSIS — Z86711 Personal history of pulmonary embolism: Secondary | ICD-10-CM

## 2020-05-10 DIAGNOSIS — R339 Retention of urine, unspecified: Secondary | ICD-10-CM | POA: Diagnosis not present

## 2020-05-10 DIAGNOSIS — Z79899 Other long term (current) drug therapy: Secondary | ICD-10-CM

## 2020-05-10 DIAGNOSIS — Z794 Long term (current) use of insulin: Secondary | ICD-10-CM

## 2020-05-10 DIAGNOSIS — R634 Abnormal weight loss: Secondary | ICD-10-CM | POA: Diagnosis not present

## 2020-05-10 DIAGNOSIS — J9622 Acute and chronic respiratory failure with hypercapnia: Secondary | ICD-10-CM | POA: Diagnosis present

## 2020-05-10 DIAGNOSIS — M1990 Unspecified osteoarthritis, unspecified site: Secondary | ICD-10-CM | POA: Diagnosis present

## 2020-05-10 DIAGNOSIS — F0390 Unspecified dementia without behavioral disturbance: Secondary | ICD-10-CM | POA: Diagnosis present

## 2020-05-10 DIAGNOSIS — E1165 Type 2 diabetes mellitus with hyperglycemia: Secondary | ICD-10-CM

## 2020-05-10 DIAGNOSIS — R400 Somnolence: Secondary | ICD-10-CM

## 2020-05-10 DIAGNOSIS — Z7401 Bed confinement status: Secondary | ICD-10-CM | POA: Diagnosis not present

## 2020-05-10 DIAGNOSIS — L89152 Pressure ulcer of sacral region, stage 2: Secondary | ICD-10-CM | POA: Diagnosis present

## 2020-05-10 DIAGNOSIS — Z7984 Long term (current) use of oral hypoglycemic drugs: Secondary | ICD-10-CM

## 2020-05-10 DIAGNOSIS — D638 Anemia in other chronic diseases classified elsewhere: Secondary | ICD-10-CM | POA: Diagnosis present

## 2020-05-10 DIAGNOSIS — J449 Chronic obstructive pulmonary disease, unspecified: Secondary | ICD-10-CM | POA: Diagnosis not present

## 2020-05-10 DIAGNOSIS — I82411 Acute embolism and thrombosis of right femoral vein: Secondary | ICD-10-CM | POA: Diagnosis present

## 2020-05-10 DIAGNOSIS — E7800 Pure hypercholesterolemia, unspecified: Secondary | ICD-10-CM | POA: Diagnosis present

## 2020-05-10 DIAGNOSIS — N179 Acute kidney failure, unspecified: Secondary | ICD-10-CM | POA: Diagnosis not present

## 2020-05-10 DIAGNOSIS — I130 Hypertensive heart and chronic kidney disease with heart failure and stage 1 through stage 4 chronic kidney disease, or unspecified chronic kidney disease: Secondary | ICD-10-CM | POA: Diagnosis present

## 2020-05-10 DIAGNOSIS — I2609 Other pulmonary embolism with acute cor pulmonale: Secondary | ICD-10-CM | POA: Diagnosis not present

## 2020-05-10 DIAGNOSIS — R197 Diarrhea, unspecified: Secondary | ICD-10-CM | POA: Diagnosis not present

## 2020-05-10 DIAGNOSIS — Z86718 Personal history of other venous thrombosis and embolism: Secondary | ICD-10-CM

## 2020-05-10 DIAGNOSIS — E46 Unspecified protein-calorie malnutrition: Secondary | ICD-10-CM | POA: Diagnosis not present

## 2020-05-10 DIAGNOSIS — N170 Acute kidney failure with tubular necrosis: Secondary | ICD-10-CM | POA: Diagnosis not present

## 2020-05-10 DIAGNOSIS — Z9049 Acquired absence of other specified parts of digestive tract: Secondary | ICD-10-CM

## 2020-05-10 DIAGNOSIS — J9602 Acute respiratory failure with hypercapnia: Secondary | ICD-10-CM

## 2020-05-10 DIAGNOSIS — G9341 Metabolic encephalopathy: Secondary | ICD-10-CM | POA: Diagnosis present

## 2020-05-10 DIAGNOSIS — J9621 Acute and chronic respiratory failure with hypoxia: Secondary | ICD-10-CM | POA: Diagnosis present

## 2020-05-10 DIAGNOSIS — N1832 Chronic kidney disease, stage 3b: Secondary | ICD-10-CM | POA: Diagnosis not present

## 2020-05-10 DIAGNOSIS — E1122 Type 2 diabetes mellitus with diabetic chronic kidney disease: Secondary | ICD-10-CM | POA: Diagnosis present

## 2020-05-10 DIAGNOSIS — I959 Hypotension, unspecified: Secondary | ICD-10-CM | POA: Diagnosis present

## 2020-05-10 DIAGNOSIS — J9600 Acute respiratory failure, unspecified whether with hypoxia or hypercapnia: Secondary | ICD-10-CM | POA: Diagnosis present

## 2020-05-10 DIAGNOSIS — W19XXXA Unspecified fall, initial encounter: Secondary | ICD-10-CM | POA: Diagnosis not present

## 2020-05-10 DIAGNOSIS — E876 Hypokalemia: Secondary | ICD-10-CM | POA: Diagnosis not present

## 2020-05-10 DIAGNOSIS — R5381 Other malaise: Secondary | ICD-10-CM | POA: Diagnosis not present

## 2020-05-10 DIAGNOSIS — G4733 Obstructive sleep apnea (adult) (pediatric): Secondary | ICD-10-CM | POA: Diagnosis present

## 2020-05-10 DIAGNOSIS — R0602 Shortness of breath: Secondary | ICD-10-CM | POA: Diagnosis not present

## 2020-05-10 DIAGNOSIS — J441 Chronic obstructive pulmonary disease with (acute) exacerbation: Principal | ICD-10-CM | POA: Diagnosis present

## 2020-05-10 DIAGNOSIS — Z20822 Contact with and (suspected) exposure to covid-19: Secondary | ICD-10-CM | POA: Diagnosis present

## 2020-05-10 DIAGNOSIS — N182 Chronic kidney disease, stage 2 (mild): Secondary | ICD-10-CM | POA: Diagnosis present

## 2020-05-10 DIAGNOSIS — E1110 Type 2 diabetes mellitus with ketoacidosis without coma: Secondary | ICD-10-CM | POA: Diagnosis not present

## 2020-05-10 DIAGNOSIS — J9811 Atelectasis: Secondary | ICD-10-CM | POA: Diagnosis present

## 2020-05-10 DIAGNOSIS — G8929 Other chronic pain: Secondary | ICD-10-CM | POA: Diagnosis present

## 2020-05-10 DIAGNOSIS — Z66 Do not resuscitate: Secondary | ICD-10-CM | POA: Diagnosis present

## 2020-05-10 DIAGNOSIS — R627 Adult failure to thrive: Secondary | ICD-10-CM | POA: Diagnosis present

## 2020-05-10 DIAGNOSIS — Z8546 Personal history of malignant neoplasm of prostate: Secondary | ICD-10-CM

## 2020-05-10 DIAGNOSIS — I471 Supraventricular tachycardia: Secondary | ICD-10-CM | POA: Diagnosis not present

## 2020-05-10 DIAGNOSIS — R4182 Altered mental status, unspecified: Secondary | ICD-10-CM | POA: Diagnosis present

## 2020-05-10 DIAGNOSIS — K649 Unspecified hemorrhoids: Secondary | ICD-10-CM | POA: Diagnosis present

## 2020-05-10 DIAGNOSIS — Z87891 Personal history of nicotine dependence: Secondary | ICD-10-CM

## 2020-05-10 DIAGNOSIS — R1310 Dysphagia, unspecified: Secondary | ICD-10-CM | POA: Diagnosis present

## 2020-05-10 DIAGNOSIS — R0902 Hypoxemia: Secondary | ICD-10-CM | POA: Diagnosis not present

## 2020-05-10 DIAGNOSIS — J9620 Acute and chronic respiratory failure, unspecified whether with hypoxia or hypercapnia: Secondary | ICD-10-CM | POA: Diagnosis not present

## 2020-05-10 DIAGNOSIS — R4020 Unspecified coma: Secondary | ICD-10-CM | POA: Diagnosis not present

## 2020-05-10 DIAGNOSIS — R42 Dizziness and giddiness: Secondary | ICD-10-CM | POA: Diagnosis present

## 2020-05-10 DIAGNOSIS — I4510 Unspecified right bundle-branch block: Secondary | ICD-10-CM | POA: Diagnosis not present

## 2020-05-10 DIAGNOSIS — I509 Heart failure, unspecified: Secondary | ICD-10-CM | POA: Diagnosis present

## 2020-05-10 DIAGNOSIS — R000 Tachycardia, unspecified: Secondary | ICD-10-CM | POA: Diagnosis not present

## 2020-05-10 DIAGNOSIS — I10 Essential (primary) hypertension: Secondary | ICD-10-CM | POA: Diagnosis not present

## 2020-05-10 DIAGNOSIS — M2550 Pain in unspecified joint: Secondary | ICD-10-CM | POA: Diagnosis not present

## 2020-05-10 DIAGNOSIS — R404 Transient alteration of awareness: Secondary | ICD-10-CM | POA: Diagnosis not present

## 2020-05-10 DIAGNOSIS — K219 Gastro-esophageal reflux disease without esophagitis: Secondary | ICD-10-CM | POA: Diagnosis present

## 2020-05-10 DIAGNOSIS — C61 Malignant neoplasm of prostate: Secondary | ICD-10-CM | POA: Diagnosis present

## 2020-05-10 DIAGNOSIS — J189 Pneumonia, unspecified organism: Secondary | ICD-10-CM

## 2020-05-10 DIAGNOSIS — M6281 Muscle weakness (generalized): Secondary | ICD-10-CM | POA: Diagnosis not present

## 2020-05-10 DIAGNOSIS — E1169 Type 2 diabetes mellitus with other specified complication: Secondary | ICD-10-CM | POA: Diagnosis not present

## 2020-05-10 LAB — CBG MONITORING, ED
Glucose-Capillary: 143 mg/dL — ABNORMAL HIGH (ref 70–99)
Glucose-Capillary: 145 mg/dL — ABNORMAL HIGH (ref 70–99)

## 2020-05-10 LAB — BRAIN NATRIURETIC PEPTIDE: B Natriuretic Peptide: 140.6 pg/mL — ABNORMAL HIGH (ref 0.0–100.0)

## 2020-05-10 LAB — RESPIRATORY PANEL BY RT PCR (FLU A&B, COVID)
Influenza A by PCR: NEGATIVE
Influenza B by PCR: NEGATIVE
SARS Coronavirus 2 by RT PCR: NEGATIVE

## 2020-05-10 LAB — HEPARIN LEVEL (UNFRACTIONATED): Heparin Unfractionated: >2.2 IU/mL — ABNORMAL HIGH (ref 0.30–0.70)

## 2020-05-10 LAB — CBC WITH DIFFERENTIAL/PLATELET
Abs Immature Granulocytes: 0.04 10*3/uL (ref 0.00–0.07)
Basophils Absolute: 0 10*3/uL (ref 0.0–0.1)
Basophils Relative: 0 %
Eosinophils Absolute: 0 10*3/uL (ref 0.0–0.5)
Eosinophils Relative: 0 %
HCT: 35.5 % — ABNORMAL LOW (ref 39.0–52.0)
Hemoglobin: 10 g/dL — ABNORMAL LOW (ref 13.0–17.0)
Immature Granulocytes: 0 %
Lymphocytes Relative: 25 %
Lymphs Abs: 2.7 10*3/uL (ref 0.7–4.0)
MCH: 27.8 pg (ref 26.0–34.0)
MCHC: 28.2 g/dL — ABNORMAL LOW (ref 30.0–36.0)
MCV: 98.6 fL (ref 80.0–100.0)
Monocytes Absolute: 0.3 10*3/uL (ref 0.1–1.0)
Monocytes Relative: 3 %
Neutro Abs: 7.8 10*3/uL — ABNORMAL HIGH (ref 1.7–7.7)
Neutrophils Relative %: 72 %
Platelets: 530 10*3/uL — ABNORMAL HIGH (ref 150–400)
RBC: 3.6 MIL/uL — ABNORMAL LOW (ref 4.22–5.81)
RDW: 16.7 % — ABNORMAL HIGH (ref 11.5–15.5)
WBC: 10.8 10*3/uL — ABNORMAL HIGH (ref 4.0–10.5)
nRBC: 0 % (ref 0.0–0.2)

## 2020-05-10 LAB — BLOOD GAS, VENOUS
Acid-Base Excess: 4.3 mmol/L — ABNORMAL HIGH (ref 0.0–2.0)
Bicarbonate: 37.4 mmol/L — ABNORMAL HIGH (ref 20.0–28.0)
O2 Saturation: 57.4 %
pCO2, Ven: >120 mmHg (ref 44.0–60.0)
pH, Ven: 7.081 — CL (ref 7.250–7.430)
pO2, Ven: 42.5 mmHg (ref 32.0–45.0)

## 2020-05-10 LAB — COMPREHENSIVE METABOLIC PANEL
ALT: 21 U/L (ref 0–44)
AST: 20 U/L (ref 15–41)
Albumin: 2.8 g/dL — ABNORMAL LOW (ref 3.5–5.0)
Alkaline Phosphatase: 104 U/L (ref 38–126)
Anion gap: 10 (ref 5–15)
BUN: 12 mg/dL (ref 8–23)
CO2: 35 mmol/L — ABNORMAL HIGH (ref 22–32)
Calcium: 8.6 mg/dL — ABNORMAL LOW (ref 8.9–10.3)
Chloride: 98 mmol/L (ref 98–111)
Creatinine, Ser: 0.91 mg/dL (ref 0.61–1.24)
GFR, Estimated: >60 mL/min (ref >60)
Glucose, Bld: 159 mg/dL — ABNORMAL HIGH (ref 70–99)
Potassium: 5.3 mmol/L — ABNORMAL HIGH (ref 3.5–5.1)
Sodium: 143 mmol/L (ref 135–145)
Total Bilirubin: 1.1 mg/dL (ref 0.3–1.2)
Total Protein: 6.1 g/dL — ABNORMAL LOW (ref 6.5–8.1)

## 2020-05-10 LAB — APTT: aPTT: 29 seconds (ref 24–36)

## 2020-05-10 MED ORDER — ACETAMINOPHEN 325 MG PO TABS
650.0000 mg | ORAL_TABLET | Freq: Four times a day (QID) | ORAL | Status: DC | PRN
  Administered 2020-05-11: 650 mg via ORAL
  Filled 2020-05-10: qty 2

## 2020-05-10 MED ORDER — METHYLPREDNISOLONE SODIUM SUCC 125 MG IJ SOLR
125.0000 mg | Freq: Once | INTRAMUSCULAR | Status: AC
  Administered 2020-05-10: 125 mg via INTRAVENOUS
  Filled 2020-05-10: qty 2

## 2020-05-10 MED ORDER — HEPARIN (PORCINE) 25000 UT/250ML-% IV SOLN
1500.0000 [IU]/h | INTRAVENOUS | Status: DC
  Administered 2020-05-10: 1500 [IU]/h via INTRAVENOUS
  Filled 2020-05-10: qty 250

## 2020-05-10 MED ORDER — INSULIN ASPART 100 UNIT/ML ~~LOC~~ SOLN
0.0000 [IU] | SUBCUTANEOUS | Status: DC
  Filled 2020-05-10: qty 0.09

## 2020-05-10 MED ORDER — INSULIN ASPART 100 UNIT/ML ~~LOC~~ SOLN
0.0000 [IU] | SUBCUTANEOUS | Status: DC
  Administered 2020-05-10 – 2020-05-11 (×2): 1 [IU] via SUBCUTANEOUS
  Administered 2020-05-11: 2 [IU] via SUBCUTANEOUS
  Administered 2020-05-11: 1 [IU] via SUBCUTANEOUS
  Filled 2020-05-10: qty 0.09

## 2020-05-10 MED ORDER — HEPARIN BOLUS VIA INFUSION
2500.0000 [IU] | Freq: Once | INTRAVENOUS | Status: DC
  Administered 2020-05-10: 2500 [IU] via INTRAVENOUS
  Filled 2020-05-10: qty 2500

## 2020-05-10 MED ORDER — ACETAMINOPHEN 650 MG RE SUPP: 650.0000 mg | Freq: Four times a day (QID) | RECTAL | Status: DC | PRN

## 2020-05-10 NOTE — ED Notes (Signed)
Call received from pt significant other Holley Bouche [phone]- returning call received earlier...requests rtn call when possible. [employer]

## 2020-05-10 NOTE — H&P (Signed)
History and Physical    Jay Holloway YCX:[id number] DOB: [birth year] DOA: 05/10/2020  PCP: [name], MD   Patient coming from: [place].  Chief Complaint: Lethargy.  HPI: Jay Holloway is a 74 y.o. male with history of diabetes mellitus type 2, hypertension, prostate cancer who has been recently admitted for hypercarbic respiratory failure possibly from undiagnosed COPD/sleep apnea prior to which patient also was admitted for DKA about few weeks ago and also recently diagnosed with DVT/PE on apixaban was found to be lethargic and was brought to the ER.  Patient had a similar picture of presentation last admission during last week.  No much further history is available.  Did discuss with patient's nephew who said that patient was found to be lethargic and was brought to the ER.  ED Course: In the ER patient is responding to his name but otherwise lethargic.  Pupils are reacting to light and ABG is significant for severe respiratory acidosis with PCO2 more than 120 and pH of 7.08.  I confirmed with patient's nephew about DNR status.  As per the nephew patient is okay to be placed on BiPAP and medications but no chest compressions or intubations.  Patient is afebrile chest x-ray unremarkable EKG shows normal sinus rhythm labs show hemoglobin of 10 WBC of 10.8 BNP of 140.8.  Potassium is 5.3.  Covid test is negative.  Review of Systems: As per HPI, rest all negative.   Past Medical History:  Diagnosis Date  . Arthritis    trouble turning head side to side and up  . Diabetes mellitus without complication ([place])   . Hemorrhoids   . High cholesterol   . Hypertension   . Prostate cancer [facility])     Past Surgical History:  Procedure Laterality Date  . CHOLECYSTECTOMY N/A 11/11/2014   Procedure: LAPAROSCOPIC CHOLECYSTECTOMY WITH INTRAOPERATIVE CHOLANGIOGRAM;  Surgeon: Erroll Luna, MD;  Location: [facility];  Service: General;  Laterality: N/A;  . COLONOSCOPY N/A 11/09/2014    Procedure: COLONOSCOPY;  Surgeon: Juanita Craver, MD;  Location: [facility] ENDOSCOPY;  Service: Endoscopy;  Laterality: N/A;  . CYSTOSCOPY WITH RETROGRADE PYELOGRAM, URETEROSCOPY AND STENT PLACEMENT Left 04/18/2014   Procedure: CYSTOSCOPY WITH RETROGRADE PYELOGRAM, URETEROSCOPY AND STENT PLACEMENT;  Surgeon: Raynelle Bring, MD;  Location: [facility];  Service: Urology;  Laterality: Left;  . HOLMIUM LASER APPLICATION Left 4/97/0263   Procedure: HOLMIUM LASER APPLICATION;  Surgeon: Raynelle Bring, MD;  Location: [facility];  Service: Urology;  Laterality: Left;  . LYMPHADENECTOMY  06/04/2012   Procedure: LYMPHADENECTOMY;  Surgeon: Dutch Gray, MD;  Location: [facility];  Service: Urology;  Laterality: Bilateral;  . right  achilles tendon repair  yrs ago  . ROBOT ASSISTED LAPAROSCOPIC RADICAL PROSTATECTOMY  06/04/2012   Procedure: ROBOTIC ASSISTED LAPAROSCOPIC RADICAL PROSTATECTOMY LEVEL 3;  Surgeon: Dutch Gray, MD;  Location: [facility];  Service: Urology;  Laterality: N/A;     reports that he quit smoking about 41 years ago. His smoking use included cigarettes. He has a 1.00 pack-year smoking history. He has never used smokeless tobacco. He reports previous alcohol use. He reports previous drug use. Drug: Marijuana.  No Known Allergies  Family History  Family history unknown: Yes    Prior to Admission medications   Medication Sig Start Date End Date Taking? Authorizing Provider  apixaban (ELIQUIS) 5 MG TABS tablet Take 2 tablets (10 mg total) by mouth 2 (two) times daily for 4 days. 05/05/20 05/09/20  Donne Hazel, MD  apixaban (ELIQUIS) 5 MG  TABS tablet Take 1 tablet (5 mg total) by mouth 2 (two) times daily. 05/10/20 06/09/20  Donne Hazel, MD  atorvastatin (LIPITOR) 10 MG tablet Take 10 mg by mouth daily.  04/21/18   [provider]  Cholecalciferol (VITAMIN D3) 25 MCG (1000 UT) CAPS Take 1,000 Units by mouth daily.  10/15/18   [provider]  collagenase (SANTYL) ointment Apply topically daily.  Apply Santyl to coccyx wound in a thick layer. Cover with a saline moistened gauze, then dry gauze..  Change daily. Patient not taking: Reported on 04/28/2020 04/20/20   Annita Brod, MD  feeding supplement, GLUCERNA SHAKE, (GLUCERNA SHAKE) LIQD Take 237 mLs by mouth 3 (three) times daily between meals.    [provider]  furosemide (LASIX) 40 MG tablet Take 0.5 tablets (20 mg total) by mouth daily. fluid Patient not taking: Reported on 04/28/2020 03/24/20   Swayze, Ava, DO  insulin glargine (LANTUS) 100 UNIT/ML injection Inject 0.1 mLs (10 Units total) into the skin daily. Give only 5 units for am glucose less than 90. 03/25/20   Swayze, Ava, DO  melatonin 3 MG TABS tablet Take 1 tablet (3 mg total) by mouth at bedtime. 03/24/20   Swayze, Ava, DO  metFORMIN (GLUCOPHAGE-XR) 500 MG 24 hr tablet Take 500 mg by mouth in the morning and at bedtime.  10/15/18   [provider]  Multiple Vitamins-Minerals (MULTIVITAMIN ADULTS) TABS Take 1 tablet by mouth daily.    [provider]  pantoprazole (PROTONIX) 40 MG tablet Take 1 tablet (40 mg total) by mouth 2 (two) times daily. 03/24/20   Swayze, Ava, DO  potassium chloride 20 MEQ TBCR Take 20 mEq by mouth daily. 03/24/20 04/28/20  Swayze, Ava, DO  Semaglutide (OZEMPIC, 1 MG/DOSE, [place]) Inject 1 mg into the skin once a week. Wed    [provider]  traMADol (ULTRAM) 50 MG tablet Take 1 tablet (50 mg total) by mouth every 12 (twelve) hours as needed for moderate pain or severe pain. 05/05/20   Donne Hazel, MD    Physical Exam: Constitutional: Moderately built and nourished. Vitals:   05/10/20 1915 05/10/20 1918 05/10/20 2000 05/10/20 2001  BP: 115/68  124/72 124/72  Pulse: (!) 102  (!) 101 100  Resp: 19  18 (!) 8  Temp: 97.7 F (36.5 C)     TempSrc: Oral     SpO2: (!) 49% 100% 100% 100%  Weight:      Height:       Eyes: Anicteric no pallor. ENMT: No discharge from the ears eyes nose or mouth. Neck: No mass felt.   No neck rigidity. Respiratory: No rhonchi or crepitations. Cardiovascular: S1-S2 heard. Abdomen: Soft nontender bowel sound present. Musculoskeletal: No edema. Skin: Sacral ulcer. Neurologic: Lethargic pupils are reacting to light. Psychiatric: Lethargic.   Labs on Admission: I have personally reviewed following labs and imaging studies  CBC: Recent Labs  Lab 05/04/20 0510 05/10/20 1945  WBC 5.7 10.8*  NEUTROABS  --  7.8*  HGB 8.5* 10.0*  HCT 27.0* 35.5*  MCV 90.9 98.6  PLT 133* 426*   Basic Metabolic Panel: Recent Labs  Lab 05/04/20 0510 05/10/20 1945  NA 142 143  K 4.3 5.3*  CL 103 98  CO2 33* 35*  GLUCOSE 207* 159*  BUN 18 12  CREATININE 0.77 0.91  CALCIUM 8.3* 8.6*   GFR: Estimated Creatinine Clearance: 86.9 mL/min (by C-G formula based on SCr of 0.91 mg/dL). Liver Function Tests: Recent Labs  Lab 05/04/20 0510 05/10/20 1945  AST 20 20  ALT 25 21  ALKPHOS 80 104  BILITOT 0.6 1.1  PROT 4.9* 6.1*  ALBUMIN 2.5* 2.8*   No results for input(s): LIPASE, AMYLASE in the last 168 hours. No results for input(s): AMMONIA in the last 168 hours. Coagulation Profile: No results for input(s): INR, PROTIME in the last 168 hours. Cardiac Enzymes: No results for input(s): CKTOTAL, CKMB, CKMBINDEX, TROPONINI in the last 168 hours. BNP (last 3 results) No results for input(s): PROBNP in the last 8760 hours. HbA1C: No results for input(s): HGBA1C in the last 72 hours. CBG: Recent Labs  Lab 05/04/20 1636 05/04/20 2015 05/05/20 0742 05/05/20 1131 05/10/20 1921  GLUCAP 162* 176* 165* 235* 143*   Lipid Profile: No results for input(s): CHOL, HDL, LDLCALC, TRIG, CHOLHDL, LDLDIRECT in the last 72 hours. Thyroid Function Tests: No results for input(s): TSH, T4TOTAL, FREET4, T3FREE, THYROIDAB in the last 72 hours. Anemia Panel: No results for input(s): VITAMINB12, FOLATE, FERRITIN, TIBC, IRON, RETICCTPCT in the last 72 hours. Urine analysis:    Component Value  Date/Time   COLORURINE AMBER (A) 04/28/2020 2016   APPEARANCEUR CLOUDY (A) 04/28/2020 2016   LABSPEC 1.020 04/28/2020 2016   PHURINE 5.0 04/28/2020 2016   GLUCOSEU NEGATIVE 04/28/2020 2016   HGBUR NEGATIVE 04/28/2020 2016   [place] NEGATIVE 04/28/2020 2016   KETONESUR NEGATIVE 04/28/2020 2016   PROTEINUR 100 (A) 04/28/2020 2016   UROBILINOGEN 0.2 11/05/2014 1712   NITRITE NEGATIVE 04/28/2020 2016   LEUKOCYTESUR LARGE (A) 04/28/2020 2016   Sepsis Labs: @LABRCNTIP (procalcitonin:4,lacticidven:4) ) Recent Results (from the past 240 hour(s))  SARS Coronavirus 2 by RT PCR (hospital order, performed in [place] hospital lab) Nasopharyngeal Nasopharyngeal Swab     Status: None   Collection Time: 05/04/20  2:32 PM   Specimen: Nasopharyngeal Swab  Result Value Ref Range Status   SARS Coronavirus 2 NEGATIVE NEGATIVE Final    Comment: (NOTE) SARS-CoV-2 target nucleic acids are NOT DETECTED.  The SARS-CoV-2 RNA is generally detectable in upper and lower respiratory specimens during the acute phase of infection. The lowest concentration of SARS-CoV-2 viral copies this assay can detect is 250 copies / mL. A negative result does not preclude SARS-CoV-2 infection and should not be used as the sole basis for treatment or other patient management decisions.  A negative result may occur with improper specimen collection / handling, submission of specimen other than nasopharyngeal swab, presence of viral mutation(s) within the areas targeted by this assay, and inadequate number of viral copies (<250 copies / mL). A negative result must be combined with clinical observations, patient history, and epidemiological information.  Fact Sheet for Patients:   StrictlyIdeas.no  Fact Sheet for Healthcare Providers: BankingDealers.co.za  This test is not yet approved or  cleared by the [place] FDA and has been authorized for detection and/or  diagnosis of SARS-CoV-2 by FDA under an Emergency Use Authorization (EUA).  This EUA will remain in effect (meaning this test can be used) for the duration of the COVID-19 declaration under Section 564(b)(1) of the Act, 21 U.S.C. section 360bbb-3(b)(1), unless the authorization is terminated or revoked sooner.  Performed at [facility], [place] [address] [place], [place] [address]   Respiratory Panel by RT PCR (Flu A&B, Covid) - Nasopharyngeal Swab     Status: None   Collection Time: 05/10/20  7:58 PM   Specimen: Nasopharyngeal Swab  Result Value Ref Range Status   SARS Coronavirus 2 by RT  PCR NEGATIVE NEGATIVE Final    Comment: (NOTE) SARS-CoV-2 target nucleic acids are NOT DETECTED.  The SARS-CoV-2 RNA is generally detectable in upper respiratoy specimens during the acute phase of infection. The lowest concentration of SARS-CoV-2 viral copies this assay can detect is 131 copies/mL. A negative result does not preclude SARS-Cov-2 infection and should not be used as the sole basis for treatment or other patient management decisions. A negative result may occur with  improper specimen collection/handling, submission of specimen other than nasopharyngeal swab, presence of viral mutation(s) within the areas targeted by this assay, and inadequate number of viral copies (<131 copies/mL). A negative result must be combined with clinical observations, patient history, and epidemiological information. The expected result is Negative.  Fact Sheet for Patients:  PinkCheek.be  Fact Sheet for Healthcare Providers:  GravelBags.it  This test is no t yet approved or cleared by the [place] FDA and  has been authorized for detection and/or diagnosis of SARS-CoV-2 by FDA under an Emergency Use Authorization (EUA). This EUA will remain  in effect (meaning this test can be used) for the duration of the COVID-19  declaration under Section 564(b)(1) of the Act, 21 U.S.C. section 360bbb-3(b)(1), unless the authorization is terminated or revoked sooner.     Influenza A by PCR NEGATIVE NEGATIVE Final   Influenza B by PCR NEGATIVE NEGATIVE Final    Comment: (NOTE) The Xpert Xpress SARS-CoV-2/FLU/RSV assay is intended as an aid in  the diagnosis of influenza from Nasopharyngeal swab specimens and  should not be used as a sole basis for treatment. Nasal washings and  aspirates are unacceptable for Xpert Xpress SARS-CoV-2/FLU/RSV  testing.  Fact Sheet for Patients: PinkCheek.be  Fact Sheet for Healthcare Providers: GravelBags.it  This test is not yet approved or cleared by the [place] FDA and  has been authorized for detection and/or diagnosis of SARS-CoV-2 by  FDA under an Emergency Use Authorization (EUA). This EUA will remain  in effect (meaning this test can be used) for the duration of the  Covid-19 declaration under Section 564(b)(1) of the Act, 21  U.S.C. section 360bbb-3(b)(1), unless the authorization is  terminated or revoked. Performed at [facility], [place] [address], [place], [place] [address]      Radiological Exams on Admission: DG Chest Port 1 View  Result Date: 05/10/2020 CLINICAL DATA:  Shortness of breath EXAM: PORTABLE CHEST 1 VIEW COMPARISON:  04/28/2020 FINDINGS: Cardiac shadow is stable. Lungs are well aerated bilaterally. Bibasilar atelectatic changes are seen. No sizable effusion is noted. No bony abnormality is seen. IMPRESSION: Mild bibasilar atelectatic changes. Electronically Signed   By: Inez Catalina M.D.   On: 05/10/2020 20:21    EKG: Independently reviewed.  Normal sinus rhythm RBBB.  Assessment/Plan Principal Problem:   Acute respiratory failure with hypoxia and hypercapnia (HCC) Active Problems:   Type 2 diabetes mellitus with hyperglycemia, with long-term current use of  insulin (HCC)   Prostate cancer (HCC)   Acute metabolic encephalopathy   CKD (chronic kidney disease), stage II   Pressure injury of sacral region, stage 2 (HCC)   Acute respiratory failure ([place])    1. Acute respiratory failure with hypoxia and hypercarbia for which patient has been placed on BiPAP.  ABG shows significant respiratory acidosis.  I confirmed with patient's next of kin patient's nephew about patient's CODE STATUS and confirmed DNR.  Okay with continue with BiPAP as per the nephew.  Will recheck ABG in the morning and if mental status  improves will try to wean off the BiPAP.  Per patient's medication list patient is not on any sedatives except for tramadol.  Will check urine drug screen ammonia levels.  Patient is responding to his name so I do not think patient is having any active seizures. 2. Acute encephalopathy secondary to hypercarbia.  See #1. 3. Diabetes mellitus type 2 we will keep patient on sliding scale coverage. 4. 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 we will keep patient on heparin for now. 5. Anemia appears to be chronic follow CBC. 6. History of prostate cancer. 7. Recent admissions for DKA and hypercarbic respiratory failure.  Since patient has severe respiratory acidosis will need close monitoring for any further worsening in inpatient status.   DVT prophylaxis: Heparin infusion. Code Status: DNR confirmed with patient's nephew. Family Communication: Patient's nephew. Disposition Plan: Back to facility when stable. Consults called: None. Admission status: Inpatient.   Rise Patience MD [facility] Pager [phone].  If 7PM-7AM, please contact night-coverage [email or web address] Password [facility]  05/10/2020, 9:36 PM

## 2020-05-10 NOTE — ED Provider Notes (Signed)
[facility] DEPT Provider Note   CSN: [id number] Arrival date & time: 05/10/20  1855     History Chief Complaint  Patient presents with  . Altered Mental Status    Jay Holloway is a 74 y.o. male.  Patient has a history of prostate cancer and dementia and COPD with significant respiratory failure.  Patient was recently admitted to the hospital for respiratory failure and improved on BiPAP.  He is a DNR/DNI.  Patient was at the nursing home and became more confused.  The history is provided by the patient.  Altered Mental Status Presenting symptoms: disorientation   Severity:  Moderate Most recent episode:  Today Episode history:  Multiple Timing:  Constant Progression:  Worsening Chronicity:  New Context: not alcohol use        Past Medical History:  Diagnosis Date  . Arthritis    trouble turning head side to side and up  . Diabetes mellitus without complication ([place])   . Hemorrhoids   . High cholesterol   . Hypertension   . Prostate cancer [facility])     Patient Active Problem List   Diagnosis Date Noted  . Acute respiratory failure with hypoxia and hypercapnia ([place]) 05/10/2020  . Hypomagnesemia 04/28/2020  . Mixed diabetic hyperlipidemia associated with type 2 diabetes mellitus ([place]) 04/28/2020  . Pressure injury of sacral region, stage 2 ([place]) 04/19/2020  . Acute diastolic CHF (congestive heart failure) ([place]) 04/19/2020  . Acute respiratory failure with hypoxia and hypercarbia ([place]) 04/17/2020  . Hypernatremia 04/17/2020  . Dysphagia 04/17/2020  . Acute metabolic encephalopathy 05/25/2535  . CKD (chronic kidney disease), stage II 04/17/2020  . Hyperkalemia 04/14/2020  . GERD without esophagitis 04/14/2020  . Slurred speech 04/14/2020  . Generalized weakness 04/14/2020  . AKI (acute kidney injury) ([place]) 03/21/2020  . Prostate cancer ([place])   . Essential hypertension   . Arterial hypotension   . Renal failure   . Type 2  diabetes mellitus with hyperglycemia, with long-term current use of insulin ([place])   . Weakness   . DKA (diabetic ketoacidoses) 11/05/2014    Past Surgical History:  Procedure Laterality Date  . CHOLECYSTECTOMY N/A 11/11/2014   Procedure: LAPAROSCOPIC CHOLECYSTECTOMY WITH INTRAOPERATIVE CHOLANGIOGRAM;  Surgeon: Erroll Luna, MD;  Location: [facility];  Service: General;  Laterality: N/A;  . COLONOSCOPY N/A 11/09/2014   Procedure: COLONOSCOPY;  Surgeon: Juanita Craver, MD;  Location: [facility] ENDOSCOPY;  Service: Endoscopy;  Laterality: N/A;  . CYSTOSCOPY WITH RETROGRADE PYELOGRAM, URETEROSCOPY AND STENT PLACEMENT Left 04/18/2014   Procedure: CYSTOSCOPY WITH RETROGRADE PYELOGRAM, URETEROSCOPY AND STENT PLACEMENT;  Surgeon: Raynelle Bring, MD;  Location: [facility] ORS;  Service: Urology;  Laterality: Left;  . HOLMIUM LASER APPLICATION Left 6/44/0347   Procedure: HOLMIUM LASER APPLICATION;  Surgeon: Raynelle Bring, MD;  Location: [facility] ORS;  Service: Urology;  Laterality: Left;  . LYMPHADENECTOMY  06/04/2012   Procedure: LYMPHADENECTOMY;  Surgeon: Dutch Gray, MD;  Location: [facility] ORS;  Service: Urology;  Laterality: Bilateral;  . right  achilles tendon repair  yrs ago  . ROBOT ASSISTED LAPAROSCOPIC RADICAL PROSTATECTOMY  06/04/2012   Procedure: ROBOTIC ASSISTED LAPAROSCOPIC RADICAL PROSTATECTOMY LEVEL 3;  Surgeon: Dutch Gray, MD;  Location: [facility] ORS;  Service: Urology;  Laterality: N/A;       Family History  Family history unknown: Yes    Social History   Tobacco Use  . Smoking status: Former Smoker    Packs/day: 0.25    Years: 4.00    Pack years: 1.00  Types: Cigarettes    Quit date: 12/16/1978    Years since quitting: 41.4  . Smokeless tobacco: Never Used  Substance Use Topics  . Alcohol use: Not Currently    Comment: remote h/o heavy use  . Drug use: Not Currently    Types: Marijuana    Comment: 1970s    Home Medications Prior to Admission medications   Medication Sig Start Date End Date Taking?  Authorizing Provider  apixaban (ELIQUIS) 5 MG TABS tablet Take 2 tablets (10 mg total) by mouth 2 (two) times daily for 4 days. 05/05/20 05/09/20  Donne Hazel, MD  apixaban (ELIQUIS) 5 MG TABS tablet Take 1 tablet (5 mg total) by mouth 2 (two) times daily. 05/10/20 06/09/20  Donne Hazel, MD  atorvastatin (LIPITOR) 10 MG tablet Take 10 mg by mouth daily.  04/21/18   [provider]  Cholecalciferol (VITAMIN D3) 25 MCG (1000 UT) CAPS Take 1,000 Units by mouth daily.  10/15/18   [provider]  collagenase (SANTYL) ointment Apply topically daily. Apply Santyl to coccyx wound in a thick layer. Cover with a saline moistened gauze, then dry gauze..  Change daily. Patient not taking: Reported on 04/28/2020 04/20/20   Annita Brod, MD  feeding supplement, GLUCERNA SHAKE, (GLUCERNA SHAKE) LIQD Take 237 mLs by mouth 3 (three) times daily between meals.    [provider]  furosemide (LASIX) 40 MG tablet Take 0.5 tablets (20 mg total) by mouth daily. fluid Patient not taking: Reported on 04/28/2020 03/24/20   Swayze, Ava, DO  insulin glargine (LANTUS) 100 UNIT/ML injection Inject 0.1 mLs (10 Units total) into the skin daily. Give only 5 units for am glucose less than 90. 03/25/20   Swayze, Ava, DO  melatonin 3 MG TABS tablet Take 1 tablet (3 mg total) by mouth at bedtime. 03/24/20   Swayze, Ava, DO  metFORMIN (GLUCOPHAGE-XR) 500 MG 24 hr tablet Take 500 mg by mouth in the morning and at bedtime.  10/15/18   [provider]  Multiple Vitamins-Minerals (MULTIVITAMIN ADULTS) TABS Take 1 tablet by mouth daily.    [provider]  pantoprazole (PROTONIX) 40 MG tablet Take 1 tablet (40 mg total) by mouth 2 (two) times daily. 03/24/20   Swayze, Ava, DO  potassium chloride 20 MEQ TBCR Take 20 mEq by mouth daily. 03/24/20 04/28/20  Swayze, Ava, DO  Semaglutide (OZEMPIC, 1 MG/DOSE, [place]) Inject 1 mg into the skin once a week. Wed    [provider]  traMADol (ULTRAM)  50 MG tablet Take 1 tablet (50 mg total) by mouth every 12 (twelve) hours as needed for moderate pain or severe pain. 05/05/20   Donne Hazel, MD    Allergies    Patient has no known allergies.  Review of Systems   Review of Systems  Unable to perform ROS: Mental status change    Physical Exam Updated Vital Signs BP 124/72 (BP Location: Left Arm)   Pulse 100   Temp 97.7 F (36.5 C) (Oral)   Resp (!) 8   Ht 6' (1.829 m)   Wt 96 kg   SpO2 100%   BMI 28.70 kg/m   Physical Exam Vitals and nursing note reviewed.  Constitutional:      Appearance: He is well-developed.     Comments: Patient lethargic  HENT:     Head: Normocephalic.     Nose: Nose normal.  Eyes:     General: No scleral icterus.    Conjunctiva/sclera: Conjunctivae  normal.  Neck:     Thyroid: No thyromegaly.  Cardiovascular:     Rate and Rhythm: Normal rate and regular rhythm.     Heart sounds: No murmur heard.  No friction rub. No gallop.   Pulmonary:     Breath sounds: No stridor. No wheezing or rales.  Chest:     Chest wall: No tenderness.  Abdominal:     General: There is no distension.     Tenderness: There is no abdominal tenderness. There is no rebound.  Musculoskeletal:        General: Normal range of motion.     Cervical back: Neck supple.  Lymphadenopathy:     Cervical: No cervical adenopathy.  Skin:    Findings: No erythema or rash.  Neurological:     Motor: No abnormal muscle tone.     Coordination: Coordination normal.     Comments: Patient lethargic     ED Results / Procedures / Treatments   Labs (all labs ordered are listed, but only abnormal results are displayed) Labs Reviewed  CBC WITH DIFFERENTIAL/PLATELET - Abnormal; Notable for the following components:      Result Value   WBC 10.8 (*)    RBC 3.60 (*)    Hemoglobin 10.0 (*)    HCT 35.5 (*)    MCHC 28.2 (*)    RDW 16.7 (*)    Platelets 530 (*)    Neutro Abs 7.8 (*)    All other components within normal limits   COMPREHENSIVE METABOLIC PANEL - Abnormal; Notable for the following components:   Potassium 5.3 (*)    CO2 35 (*)    Glucose, Bld 159 (*)    Calcium 8.6 (*)    Total Protein 6.1 (*)    Albumin 2.8 (*)    All other components within normal limits  BLOOD GAS, VENOUS - Abnormal; Notable for the following components:   pH, Ven 7.081 (*)    pCO2, Ven >120.0 (*)    Bicarbonate 37.4 (*)    Acid-Base Excess 4.3 (*)    All other components within normal limits  BRAIN NATRIURETIC PEPTIDE - Abnormal; Notable for the following components:   B Natriuretic Peptide 140.6 (*)    All other components within normal limits  CBG MONITORING, ED - Abnormal; Notable for the following components:   Glucose-Capillary 143 (*)    All other components within normal limits  RESPIRATORY PANEL BY RT PCR (FLU A&B, COVID)    EKG None  Radiology DG Chest Port 1 View  Result Date: 05/10/2020 CLINICAL DATA:  Shortness of breath EXAM: PORTABLE CHEST 1 VIEW COMPARISON:  04/28/2020 FINDINGS: Cardiac shadow is stable. Lungs are well aerated bilaterally. Bibasilar atelectatic changes are seen. No sizable effusion is noted. No bony abnormality is seen. IMPRESSION: Mild bibasilar atelectatic changes. Electronically Signed   By: Inez Catalina M.D.   On: 05/10/2020 20:21    Procedures Procedures (including critical care time)  Medications Ordered in ED Medications  methylPREDNISolone sodium succinate (SOLU-MEDROL) 125 mg/2 mL injection 125 mg (125 mg Intravenous Given 05/10/20 2052)    ED Course  I have reviewed the triage vital signs and the nursing notes.  Pertinent labs & imaging results that were available during my care of the patient were reviewed by me and considered in my medical decision making (see chart for details).    CRITICAL CARE Performed by: Milton Ferguson Total critical care time: 27minutes Critical care time was exclusive of separately billable procedures and treating  other patients. Critical  care was necessary to treat or prevent imminent or life-threatening deterioration. Critical care was time spent personally by me on the following activities: development of treatment plan with patient and/or surrogate as well as nursing, discussions with consultants, evaluation of patient's response to treatment, examination of patient, obtaining history from patient or surrogate, ordering and performing treatments and interventions, ordering and review of laboratory studies, ordering and review of radiographic studies, pulse oximetry and re-evaluation of patient's condition.  MDM Rules/Calculators/A&P                            Patient is in respiratory failure he is a DNR/DNI.  Hospitalist will admit for respiratory failure      This patient presents to the ED for concern of altered mental status, this involves an extensive number of treatment options, and is a complaint that carries with it a high risk of complications and morbidity.  The differential diagnosis includes CO2 retention   Lab Tests:   I Ordered, reviewed, and interpreted labs, which included CBC chemistries show anemia elevated white count along with a blood gas that shows significantly elevated PCO2  Medicines ordered:   I ordered medication BiPAP for CO2 retention  Imaging Studies ordered:   I ordered imaging studies which included chest x-ray no acute disease  I independently visualized and interpreted imaging which showed no acute disease  Additional history obtained:   Additional history obtained from record  Previous records obtained and reviewed.  Consultations Obtained:   I consulted hospitalist and discussed lab and imaging findings  Reevaluation:  After the interventions stated above, I reevaluated the patient and found mild improvement  Critical Interventions:  .    Final Clinical Impression(s) / ED Diagnoses Final diagnoses:  None    Rx / DC Orders ED Discharge Orders    None        Milton Ferguson, MD 05/12/20 1142

## 2020-05-10 NOTE — Progress Notes (Signed)
ANTICOAGULATION CONSULT NOTE - Initial Consult  Pharmacy Consult for Heparin (on apixaban PTA) Indication: pulmonary embolus  No Known Allergies  Patient Measurements: Height: 6' (182.9 cm) Weight: 96 kg (211 lb 10.3 oz) IBW/kg (Calculated) : 77.6 Heparin Dosing Weight: actual body weight  Vital Signs: Temp: 97.7 F (36.5 C) (10/13 1915) Temp Source: Oral (10/13 1915) BP: 119/73 (10/13 2100) Pulse Rate: 106 (10/13 2100)  Labs: Recent Labs    05/10/20 1945  HGB 10.0*  HCT 35.5*  PLT 530*  APTT 29  CREATININE 0.91    Estimated Creatinine Clearance: 86.9 mL/min (by C-G formula based on SCr of 0.91 mg/dL).   Medical History: Past Medical History:  Diagnosis Date  . Arthritis    trouble turning head side to side and up  . Diabetes mellitus without complication ([place])   . Hemorrhoids   . High cholesterol   . Hypertension   . Prostate cancer ([place])     Medications:  PTA Eliquis   Assessment:  74 yr male admitted with acute respiratory failure and AMS  PMH significant for prostate cancer, dementia, COPD. Recent hospitalization where was found to have a femoral thrombus and PE.   Pharmacy consulted to dose IV heparin  Patient was on Eliquis 10mg  po BID x 7 days and then to decrease Eliquis to 5mg  po BID today (10/13).    Last dose of Eliquis = 10mg  on 10/12 @ 1800  No Eliquis taken today  Due to Eliquis' effect on falsely elevating heparin level, will monitor IV heparin with aPTT and HL until levels correlate  Goal of Therapy:  Heparin level 0.3-0.7 units/ml aPTT 66-102 seconds Monitor platelets by anticoagulation protocol: Yes   Plan:   Check baseline aPTT and heparin level  Heparin 2500 unit IV bolus x 1 followed by heparin gtt @ 1500 units/hr  Check aPTT/HL 8 hr after heparin started  Follow HL and CBC daily while on heparin gtt  Everette Rank, PharmD 05/10/2020,10:02 PM

## 2020-05-10 NOTE — ED Notes (Signed)
Date and time results received: 05/10/20 2011 (use smartphrase ".now" to insert current time)  Test: pH 7.081, PCO2 > 120 Critical Value: 7.081 Name of Provider Notified: Dr. Roderic Palau  Orders Received? Or Actions Taken?: Respiratory notified

## 2020-05-10 NOTE — ED Triage Notes (Signed)
Per ems, altered mental status/lethargic, last seen normal at 10am per facility staff. On arrival patient is responsive to voice.

## 2020-05-10 NOTE — Progress Notes (Signed)
Placed pt. on BiPAP DS per order, 2 lpm Oxygen placed inline to circuit with current saturations of 95% with RR of 16, I-16/E-8, this RT familiar with pt., have cared for recently on last admission, Pt. tolerating well, RN made aware, RT to monitor.

## 2020-05-11 ENCOUNTER — Other Ambulatory Visit: Payer: Self-pay

## 2020-05-11 DIAGNOSIS — J9602 Acute respiratory failure with hypercapnia: Secondary | ICD-10-CM | POA: Diagnosis not present

## 2020-05-11 DIAGNOSIS — G9341 Metabolic encephalopathy: Secondary | ICD-10-CM | POA: Diagnosis not present

## 2020-05-11 DIAGNOSIS — J9601 Acute respiratory failure with hypoxia: Secondary | ICD-10-CM | POA: Diagnosis not present

## 2020-05-11 LAB — GLUCOSE, CAPILLARY
Glucose-Capillary: 115 mg/dL — ABNORMAL HIGH (ref 70–99)
Glucose-Capillary: 135 mg/dL — ABNORMAL HIGH (ref 70–99)
Glucose-Capillary: 144 mg/dL — ABNORMAL HIGH (ref 70–99)
Glucose-Capillary: 147 mg/dL — ABNORMAL HIGH (ref 70–99)
Glucose-Capillary: 159 mg/dL — ABNORMAL HIGH (ref 70–99)
Glucose-Capillary: 179 mg/dL — ABNORMAL HIGH (ref 70–99)
Glucose-Capillary: 189 mg/dL — ABNORMAL HIGH (ref 70–99)

## 2020-05-11 LAB — POCT I-STAT 7, (LYTES, BLD GAS, ICA,H+H)
Acid-Base Excess: 14 mmol/L — ABNORMAL HIGH (ref 0.0–2.0)
Bicarbonate: 40.6 mmol/L — ABNORMAL HIGH (ref 20.0–28.0)
Calcium, Ion: 1.15 mmol/L (ref 1.15–1.40)
HCT: 30 % — ABNORMAL LOW (ref 39.0–52.0)
Hemoglobin: 10.2 g/dL — ABNORMAL LOW (ref 13.0–17.0)
O2 Saturation: 99 %
Patient temperature: 98.6
Potassium: 5.3 mmol/L — ABNORMAL HIGH (ref 3.5–5.1)
Sodium: 140 mmol/L (ref 135–145)
TCO2: 42 mmol/L — ABNORMAL HIGH (ref 22–32)
pCO2 arterial: 60.1 mmHg — ABNORMAL HIGH (ref 32.0–48.0)
pH, Arterial: 7.438 (ref 7.350–7.450)
pO2, Arterial: 151 mmHg — ABNORMAL HIGH (ref 83.0–108.0)

## 2020-05-11 LAB — COMPREHENSIVE METABOLIC PANEL
ALT: 19 U/L (ref 0–44)
AST: 15 U/L (ref 15–41)
Albumin: 2.6 g/dL — ABNORMAL LOW (ref 3.5–5.0)
Alkaline Phosphatase: 94 U/L (ref 38–126)
Anion gap: 14 (ref 5–15)
BUN: 19 mg/dL (ref 8–23)
CO2: 34 mmol/L — ABNORMAL HIGH (ref 22–32)
Calcium: 8.8 mg/dL — ABNORMAL LOW (ref 8.9–10.3)
Chloride: 96 mmol/L — ABNORMAL LOW (ref 98–111)
Creatinine, Ser: 0.96 mg/dL (ref 0.61–1.24)
GFR, Estimated: >60 mL/min (ref >60)
Glucose, Bld: 173 mg/dL — ABNORMAL HIGH (ref 70–99)
Potassium: 5.1 mmol/L (ref 3.5–5.1)
Sodium: 144 mmol/L (ref 135–145)
Total Bilirubin: 1.5 mg/dL — ABNORMAL HIGH (ref 0.3–1.2)
Total Protein: 6 g/dL — ABNORMAL LOW (ref 6.5–8.1)

## 2020-05-11 LAB — CBC
HCT: 32.3 % — ABNORMAL LOW (ref 39.0–52.0)
Hemoglobin: 9.5 g/dL — ABNORMAL LOW (ref 13.0–17.0)
MCH: 28.5 pg (ref 26.0–34.0)
MCHC: 29.4 g/dL — ABNORMAL LOW (ref 30.0–36.0)
MCV: 97 fL (ref 80.0–100.0)
Platelets: 508 10*3/uL — ABNORMAL HIGH (ref 150–400)
RBC: 3.33 MIL/uL — ABNORMAL LOW (ref 4.22–5.81)
RDW: 16.5 % — ABNORMAL HIGH (ref 11.5–15.5)
WBC: 10.3 10*3/uL (ref 4.0–10.5)
nRBC: 0 % (ref 0.0–0.2)

## 2020-05-11 LAB — RAPID URINE DRUG SCREEN, HOSP PERFORMED
Amphetamines: NOT DETECTED
Barbiturates: NOT DETECTED
Benzodiazepines: NOT DETECTED
Cocaine: NOT DETECTED
Opiates: NOT DETECTED
Tetrahydrocannabinol: NOT DETECTED

## 2020-05-11 LAB — APTT: aPTT: 144 seconds — ABNORMAL HIGH (ref 24–36)

## 2020-05-11 LAB — HEPARIN LEVEL (UNFRACTIONATED): Heparin Unfractionated: >2.2 IU/mL — ABNORMAL HIGH (ref 0.30–0.70)

## 2020-05-11 LAB — AMMONIA: Ammonia: 15 umol/L (ref 9–35)

## 2020-05-11 LAB — MRSA PCR SCREENING: MRSA by PCR: NEGATIVE

## 2020-05-11 MED ORDER — ORAL CARE MOUTH RINSE
15.0000 mL | Freq: Two times a day (BID) | OROMUCOSAL | Status: DC
  Administered 2020-05-11 – 2020-05-18 (×16): 15 mL via OROMUCOSAL

## 2020-05-11 MED ORDER — ATORVASTATIN CALCIUM 10 MG PO TABS
10.0000 mg | ORAL_TABLET | Freq: Every day | ORAL | Status: DC
  Administered 2020-05-11 – 2020-05-18 (×8): 10 mg via ORAL
  Filled 2020-05-11 (×8): qty 1

## 2020-05-11 MED ORDER — RESOURCE THICKENUP CLEAR PO POWD
ORAL | Status: DC | PRN
  Filled 2020-05-11: qty 125

## 2020-05-11 MED ORDER — IPRATROPIUM-ALBUTEROL 0.5-2.5 (3) MG/3ML IN SOLN
3.0000 mL | Freq: Three times a day (TID) | RESPIRATORY_TRACT | Status: DC
  Administered 2020-05-11 – 2020-05-13 (×5): 3 mL via RESPIRATORY_TRACT
  Filled 2020-05-11 (×5): qty 3

## 2020-05-11 MED ORDER — APIXABAN 5 MG PO TABS: 5.0000 mg | ORAL_TABLET | Freq: Two times a day (BID) | ORAL | Status: DC

## 2020-05-11 MED ORDER — PANTOPRAZOLE SODIUM 40 MG PO TBEC
40.0000 mg | DELAYED_RELEASE_TABLET | Freq: Two times a day (BID) | ORAL | Status: DC
  Administered 2020-05-11 – 2020-05-18 (×16): 40 mg via ORAL
  Filled 2020-05-11 (×16): qty 1

## 2020-05-11 MED ORDER — GLUCERNA SHAKE PO LIQD
237.0000 mL | Freq: Three times a day (TID) | ORAL | Status: DC
  Filled 2020-05-11 (×4): qty 237

## 2020-05-11 MED ORDER — METHYLPREDNISOLONE SODIUM SUCC 40 MG IJ SOLR
40.0000 mg | Freq: Two times a day (BID) | INTRAMUSCULAR | Status: DC
  Administered 2020-05-11 – 2020-05-12 (×3): 40 mg via INTRAVENOUS
  Filled 2020-05-11 (×3): qty 1

## 2020-05-11 MED ORDER — INSULIN ASPART 100 UNIT/ML ~~LOC~~ SOLN
0.0000 [IU] | Freq: Three times a day (TID) | SUBCUTANEOUS | Status: DC
  Administered 2020-05-11: 1 [IU] via SUBCUTANEOUS
  Administered 2020-05-12 (×3): 3 [IU] via SUBCUTANEOUS
  Administered 2020-05-13 (×2): 1 [IU] via SUBCUTANEOUS
  Administered 2020-05-13 – 2020-05-14 (×2): 2 [IU] via SUBCUTANEOUS
  Administered 2020-05-14: 3 [IU] via SUBCUTANEOUS
  Administered 2020-05-14 – 2020-05-15 (×2): 2 [IU] via SUBCUTANEOUS
  Administered 2020-05-15: 5 [IU] via SUBCUTANEOUS
  Administered 2020-05-15: 2 [IU] via SUBCUTANEOUS
  Administered 2020-05-16: 1 [IU] via SUBCUTANEOUS
  Administered 2020-05-16 (×2): 2 [IU] via SUBCUTANEOUS
  Administered 2020-05-17 (×2): 1 [IU] via SUBCUTANEOUS
  Administered 2020-05-18: 5 [IU] via SUBCUTANEOUS
  Administered 2020-05-18: 3 [IU] via SUBCUTANEOUS
  Administered 2020-05-18: 2 [IU] via SUBCUTANEOUS

## 2020-05-11 MED ORDER — APIXABAN 5 MG PO TABS
5.0000 mg | ORAL_TABLET | Freq: Two times a day (BID) | ORAL | Status: DC
  Administered 2020-05-11 – 2020-05-18 (×16): 5 mg via ORAL
  Filled 2020-05-11 (×16): qty 1

## 2020-05-11 MED ORDER — ACETAMINOPHEN 650 MG RE SUPP: 650.0000 mg | Freq: Four times a day (QID) | RECTAL | Status: DC | PRN

## 2020-05-11 MED ORDER — ADULT MULTIVITAMIN W/MINERALS CH: 1.0000 | ORAL_TABLET | Freq: Every day | ORAL | Status: DC

## 2020-05-11 MED ORDER — VITAMIN D3 25 MCG (1000 UNIT) PO TABS
1000.0000 [IU] | ORAL_TABLET | Freq: Every day | ORAL | Status: DC
  Administered 2020-05-11 – 2020-05-18 (×8): 1000 [IU] via ORAL
  Filled 2020-05-11 (×8): qty 1

## 2020-05-11 MED ORDER — SODIUM CHLORIDE 0.9 % IV BOLUS
500.0000 mL | Freq: Once | INTRAVENOUS | Status: AC
  Administered 2020-05-11: 500 mL via INTRAVENOUS

## 2020-05-11 MED ORDER — HEPARIN (PORCINE) 25000 UT/250ML-% IV SOLN
1250.0000 [IU]/h | INTRAVENOUS | Status: AC
  Administered 2020-05-11: 1250 [IU]/h via INTRAVENOUS

## 2020-05-11 MED ORDER — ADULT MULTIVITAMIN LIQUID CH
15.0000 mL | Freq: Every day | ORAL | Status: DC
  Administered 2020-05-11 – 2020-05-18 (×8): 15 mL via ORAL
  Filled 2020-05-11 (×8): qty 15

## 2020-05-11 MED ORDER — INSULIN ASPART 100 UNIT/ML ~~LOC~~ SOLN
0.0000 [IU] | Freq: Every day | SUBCUTANEOUS | Status: DC
  Administered 2020-05-12 – 2020-05-13 (×2): 2 [IU] via SUBCUTANEOUS
  Administered 2020-05-14 – 2020-05-15 (×2): 3 [IU] via SUBCUTANEOUS

## 2020-05-11 MED ORDER — ACETAMINOPHEN 500 MG PO TABS
1000.0000 mg | ORAL_TABLET | Freq: Three times a day (TID) | ORAL | Status: DC | PRN
  Administered 2020-05-11 – 2020-05-12 (×3): 1000 mg via ORAL
  Filled 2020-05-11 (×3): qty 2

## 2020-05-11 MED ORDER — FUROSEMIDE 20 MG PO TABS
20.0000 mg | ORAL_TABLET | Freq: Every day | ORAL | Status: DC
  Administered 2020-05-11 – 2020-05-17 (×7): 20 mg via ORAL
  Filled 2020-05-11 (×7): qty 1

## 2020-05-11 MED ORDER — APIXABAN 5 MG PO TABS: 10.0000 mg | ORAL_TABLET | Freq: Two times a day (BID) | ORAL | Status: DC

## 2020-05-11 MED ORDER — IPRATROPIUM-ALBUTEROL 0.5-2.5 (3) MG/3ML IN SOLN
3.0000 mL | RESPIRATORY_TRACT | Status: DC
  Administered 2020-05-11 (×3): 3 mL via RESPIRATORY_TRACT
  Filled 2020-05-11 (×3): qty 3

## 2020-05-11 MED ORDER — ALBUTEROL SULFATE (2.5 MG/3ML) 0.083% IN NEBU: 2.5000 mg | INHALATION_SOLUTION | RESPIRATORY_TRACT | Status: DC | PRN

## 2020-05-11 MED ORDER — CHLORHEXIDINE GLUCONATE CLOTH 2 % EX PADS
6.0000 | MEDICATED_PAD | Freq: Every day | CUTANEOUS | Status: DC
  Administered 2020-05-12 – 2020-05-17 (×6): 6 via TOPICAL

## 2020-05-11 MED ORDER — SODIUM CHLORIDE 0.9 % IV SOLN: INTRAVENOUS | Status: DC

## 2020-05-11 NOTE — Progress Notes (Signed)
PT ABG (P02 100+) on 2 lpm and Sp02 100%. Placed PT on RA, however RN stated earlier in shift PT was sleeping on 2 lpm and had desaturation event. Therefore, PT will remain on 2 LPM. RN aware.Marland Kitchen

## 2020-05-11 NOTE — Progress Notes (Signed)
PT demonstrated hands on understanding of Flutter device- has non productive cough at this time.

## 2020-05-11 NOTE — Progress Notes (Signed)
MD notified of patient's blood pressure again dropping to 79/53. Patient reporting 10/10 pain and repeatedly requesting stronger pain medication than tylenol. This RN educated patient on opioid effects on blood pressure, notified MD and provided repositioning and ice to right leg. Will continue to monitor.

## 2020-05-11 NOTE — Progress Notes (Signed)
[place] for Eliquis Indication: pulmonary embolus  No Known Allergies  Patient Measurements: Height: 6' (182.9 cm) Weight: 94.3 kg (207 lb 14.3 oz) IBW/kg (Calculated) : 77.6 Heparin Dosing Weight: actual body weight  Vital Signs: Temp: 98.1 F (36.7 C) (10/14 1154) Temp Source: Axillary (10/14 1154) BP: 104/45 (10/14 1000) Pulse Rate: 102 (10/14 1000)  Labs: Recent Labs    05/10/20 1945 05/10/20 1945 05/11/20 0414 05/11/20 0808  HGB 10.0*   < > 9.5* 10.2*  HCT 35.5*  --  32.3* 30.0*  PLT 530*  --  508*  --   APTT 29  --  144*  --   HEPARINUNFRC >2.20*  --  >2.20*  --   CREATININE 0.91  --  0.96  --    < > = values in this interval not displayed.    Estimated Creatinine Clearance: 81.7 mL/min (by C-G formula based on SCr of 0.96 mg/dL).   Medical History: Past Medical History:  Diagnosis Date  . Arthritis    trouble turning head side to side and up  . Diabetes mellitus without complication ([place])   . Hemorrhoids   . High cholesterol   . Hypertension   . Prostate cancer ([place])     Medications:  PTA Eliquis   Assessment:  74 yr male admitted with acute respiratory failure and AMS  PMH significant for prostate cancer, dementia, COPD. Recent hospitalization where was found to have a femoral thrombus and PE.   Pharmacy consulted to dose IV heparin  Patient was on Eliquis 10mg  po BID x 7 days and then to decrease Eliquis to 5mg  po BID today (10/13).    Last dose of Eliquis = 10mg  on 10/12 @ 1800  No Eliquis taken today  Due to Eliquis' effect on falsely elevating heparin level, will monitor IV heparin with aPTT and HL until levels correlate   10/14 consulted to transition back to Eliquis   Plan:  Stop heparin infusion and initiate Eliquis 5 mg bid Will follow remotely  Thank you for the consult  Napoleon Form, PharmD 05/11/2020,12:30 PM

## 2020-05-11 NOTE — Progress Notes (Addendum)
PROGRESS NOTE   Jay Holloway  KYH:[id number]    DOB: 06-May-1946    DOA: 05/10/2020  PCP: [name], MD   I have briefly reviewed patients previous medical records in [facility].  Chief Complaint  Patient presents with  . Altered Mental Status    Brief Narrative:  74 year old male with PMH of DM2, HTN, prostate cancer, recent hospitalization for hypercarbic respiratory failure possibly from undiagnosed COPD/sleep apnea, prior to that also admitted for DKA, recently diagnosed with DVT/PE on apixaban, sent to the ED from SNF where he was found to be lethargic.  He reportedly had a similar presentation last admission.  Admitted to stepdown unit for acute on chronic hypercarbic and hypoxic respiratory failure with associated acute metabolic encephalopathy.  Remained on BiPAP overnight, mental status and ABG improved.  Pulmonology consulted this morning for assistance given recurrent similar presentation,?  Candidate for nightly BiPAP.   Assessment & Plan:  Principal Problem:   Acute respiratory failure with hypoxia and hypercapnia (HCC) Active Problems:   Type 2 diabetes mellitus with hyperglycemia, with long-term current use of insulin (HCC)   Prostate cancer (HCC)   Acute metabolic encephalopathy   CKD (chronic kidney disease), stage II   Pressure injury of sacral region, stage 2 (HCC)   Acute respiratory failure (HCC)   Acute on chronic respiratory failure with hypoxia and hypercarbia/possible COPD exacerbation:  Appears to be a recurrent presentation.  VBG on admission: pH 7.081, PCO2 >120, PO2 42.5, HCO3 37.4 and oxygen saturation 57.4.  Placed on BiPAP overnight.  Mental status significantly improved, alert and oriented x2 this morning.  ABG this morning: pH 7.438, PCO2 60.1, PO2 151, HCO3 40.6 and oxygen saturation 99%.  Transition from BiPAP to prior home level of oxygen 2 L/min via nasal cannula.?  BiPAP at bedtime and as needed for AMS.  Suspect chronic CO2  retainer.  Requested pulmonology given recurrent similar presentation to see if he is a candidate for nightly BiPAP or other evaluation and management.  UDS negative.  Ammonia level normal.  COPD exacerbation:  Mild.  Received Solu-Medrol 125 mg x 1 in ED.  We will continue Solu-Medrol 40 mg every 12 hourly for today then reassess in a.m. for possible transitioning to oral prednisone.  Bronchodilator nebs.  Flutter valve.  No clinical concern for infectious etiology and recently completed a course of antibiotics for pneumonia.  Possible OSA  Await pulmonology input.  Unsure if he has had a formal sleep study and if not will have to be done as outpatient.  Acute metabolic encephalopathy:  Secondary to hypercarbic and hypoxic respiratory failure.  Improved.  Monitor closely.  Avoid sedatives and narcotics.  Mild hyperkalemia  Unclear etiology.  Improved.  Follow BMP in a.m.  Type II DM:  Continue SSI and monitor CBGs closely.   Recent admission for DKA.  Recent DVT/PE:  Due to AMS on admission, was placed on IV heparin drip.  We will transition back to Eliquis per pharmacy.   Essential hypertension:  Soft blood pressures.  Asymptomatic.  Monitor.  Anemia of chronic disease  Stable.  Adult failure to thrive  Multifactorial due to significant comorbidities.  PT and OT evaluation.   Pressure Injury 05/10/20 Coccyx Right Stage 2 -  Partial thickness loss of dermis presenting as a shallow open injury with a red, pink wound bed without slough. (Active)  05/10/20 2300  Location: Coccyx  Location Orientation: Right  Staging: Stage 2 -  Partial thickness loss of dermis presenting  as a shallow open injury with a red, pink wound bed without slough.  Wound Description (Comments):   Present on Admission: Yes     Pressure Injury 05/10/20 Coccyx Left Stage 2 -  Partial thickness loss of dermis presenting as a shallow open injury with a red, pink wound bed without slough.  (Active)  05/10/20 2300  Location: Coccyx  Location Orientation: Left  Staging: Stage 2 -  Partial thickness loss of dermis presenting as a shallow open injury with a red, pink wound bed without slough.  Wound Description (Comments):   Present on Admission:      Pressure Injury 05/10/20 Heel Right Deep Tissue Pressure Injury - Purple or maroon localized area of discolored intact skin or blood-filled blister due to damage of underlying soft tissue from pressure and/or shear. (Active)  05/10/20 2300  Location: Heel  Location Orientation: Right  Staging: Deep Tissue Pressure Injury - Purple or maroon localized area of discolored intact skin or blood-filled blister due to damage of underlying soft tissue from pressure and/or shear.  Wound Description (Comments):   Present on Admission:          DVT prophylaxis:   Currently on IV heparin drip.  Will transition back to Eliquis.   Code Status: DNR  Family Communication: None at bedside.  Admitting MD confirmed with patient's nephew regarding his DNR status.  Had been seen by palliative care medicine during previous admission and was DNR as well. Disposition:  Status is: Inpatient  Remains inpatient appropriate because:Inpatient level of care appropriate due to severity of illness   Dispo: The patient is from: SNF              Anticipated d/c is to: SNF              Anticipated d/c date is: 2 days              Patient currently is not medically stable to d/c.        Consultants:   Pulmonology  Procedures:   BiPAP  Antimicrobials:    Anti-infectives (From admission, onward)   None        Subjective:  Patient interviewed and examined along with his RN in room.  Was on BiPAP, removed.  Alert and oriented x2.  Unable to tell why he is in the hospital.  Denies dyspnea or pain at this time.  Poor historian.  Objective:   Vitals:   05/11/20 0755 05/11/20 0800 05/11/20 1000 05/11/20 1154  BP:  (!) 97/50 (!) 104/45    Pulse:  (!) 103 (!) 102   Resp:  18 11   Temp:  97.8 F (36.6 C)    TempSrc:  Axillary    SpO2: 100% 98%  100%  Weight:      Height:        General exam: Elderly male, moderately built and frail, chronically ill looking lying comfortably propped up in bed without distress. Respiratory system: Slightly harsh breath sounds.  Occasional rhonchi anteriorly.  No crackles.  No increased work of breathing. Cardiovascular system: S1 & S2 heard, RRR. No JVD, murmurs, rubs, gallops or clicks.  Trace bilateral ankle edema.  Telemetry personally reviewed: SR-sinus tachycardia in the 100s. Gastrointestinal system: Abdomen is nondistended, soft and nontender. No organomegaly or masses felt. Normal bowel sounds heard. Central nervous system: Alert and oriented x2. No focal neurological deficits. Extremities: Symmetric 5 x 5 power. Skin: No rashes, lesions or ulcers Psychiatry: Judgement and insight impaired. Mood &  affect flat.     Data Reviewed:   I have personally reviewed following labs and imaging studies   CBC: Recent Labs  Lab 05/10/20 1945 05/11/20 0414 05/11/20 0808  WBC 10.8* 10.3  --   NEUTROABS 7.8*  --   --   HGB 10.0* 9.5* 10.2*  HCT 35.5* 32.3* 30.0*  MCV 98.6 97.0  --   PLT 530* 508*  --     Basic Metabolic Panel: Recent Labs  Lab 05/10/20 1945 05/11/20 0414 05/11/20 0808  NA 143 144 140  K 5.3* 5.1 5.3*  CL 98 96*  --   CO2 35* 34*  --   GLUCOSE 159* 173*  --   BUN 12 19  --   CREATININE 0.91 0.96  --   CALCIUM 8.6* 8.8*  --     Liver Function Tests: Recent Labs  Lab 05/10/20 1945 05/11/20 0414  AST 20 15  ALT 21 19  ALKPHOS 104 94  BILITOT 1.1 1.5*  PROT 6.1* 6.0*  ALBUMIN 2.8* 2.6*    CBG: Recent Labs  Lab 05/11/20 0336 05/11/20 0749 05/11/20 1140  GLUCAP 159* 144* 115*    Microbiology Studies:   Recent Results (from the past 240 hour(s))  SARS Coronavirus 2 by RT PCR (hospital order, performed in [facility] hospital lab)  Nasopharyngeal Nasopharyngeal Swab     Status: None   Collection Time: 05/04/20  2:32 PM   Specimen: Nasopharyngeal Swab  Result Value Ref Range Status   SARS Coronavirus 2 NEGATIVE NEGATIVE Final    Comment: (NOTE) SARS-CoV-2 target nucleic acids are NOT DETECTED.  The SARS-CoV-2 RNA is generally detectable in upper and lower respiratory specimens during the acute phase of infection. The lowest concentration of SARS-CoV-2 viral copies this assay can detect is 250 copies / mL. A negative result does not preclude SARS-CoV-2 infection and should not be used as the sole basis for treatment or other patient management decisions.  A negative result may occur with improper specimen collection / handling, submission of specimen other than nasopharyngeal swab, presence of viral mutation(s) within the areas targeted by this assay, and inadequate number of viral copies (<250 copies / mL). A negative result must be combined with clinical observations, patient history, and epidemiological information.  Fact Sheet for Patients:   StrictlyIdeas.no  Fact Sheet for Healthcare Providers: BankingDealers.co.za  This test is not yet approved or  cleared by the [place] FDA and has been authorized for detection and/or diagnosis of SARS-CoV-2 by FDA under an Emergency Use Authorization (EUA).  This EUA will remain in effect (meaning this test can be used) for the duration of the COVID-19 declaration under Section 564(b)(1) of the Act, 21 U.S.C. section 360bbb-3(b)(1), unless the authorization is terminated or revoked sooner.  Performed at [facility], [place] [address], [place],  [address]   Respiratory Panel by RT PCR (Flu A&B, Covid) - Nasopharyngeal Swab     Status: None   Collection Time: 05/10/20  7:58 PM   Specimen: Nasopharyngeal Swab  Result Value Ref Range Status   SARS Coronavirus 2 by RT PCR NEGATIVE NEGATIVE  Final    Comment: (NOTE) SARS-CoV-2 target nucleic acids are NOT DETECTED.  The SARS-CoV-2 RNA is generally detectable in upper respiratoy specimens during the acute phase of infection. The lowest concentration of SARS-CoV-2 viral copies this assay can detect is 131 copies/mL. A negative result does not preclude SARS-Cov-2 infection and should not be used as the sole basis for treatment  or other patient management decisions. A negative result may occur with  improper specimen collection/handling, submission of specimen other than nasopharyngeal swab, presence of viral mutation(s) within the areas targeted by this assay, and inadequate number of viral copies (<131 copies/mL). A negative result must be combined with clinical observations, patient history, and epidemiological information. The expected result is Negative.  Fact Sheet for Patients:  PinkCheek.be  Fact Sheet for Healthcare Providers:  GravelBags.it  This test is no t yet approved or cleared by the [place] FDA and  has been authorized for detection and/or diagnosis of SARS-CoV-2 by FDA under an Emergency Use Authorization (EUA). This EUA will remain  in effect (meaning this test can be used) for the duration of the COVID-19 declaration under Section 564(b)(1) of the Act, 21 U.S.C. section 360bbb-3(b)(1), unless the authorization is terminated or revoked sooner.     Influenza A by PCR NEGATIVE NEGATIVE Final   Influenza B by PCR NEGATIVE NEGATIVE Final    Comment: (NOTE) The Xpert Xpress SARS-CoV-2/FLU/RSV assay is intended as an aid in  the diagnosis of influenza from Nasopharyngeal swab specimens and  should not be used as a sole basis for treatment. Nasal washings and  aspirates are unacceptable for Xpert Xpress SARS-CoV-2/FLU/RSV  testing.  Fact Sheet for Patients: PinkCheek.be  Fact Sheet for Healthcare  Providers: GravelBags.it  This test is not yet approved or cleared by the [place] FDA and  has been authorized for detection and/or diagnosis of SARS-CoV-2 by  FDA under an Emergency Use Authorization (EUA). This EUA will remain  in effect (meaning this test can be used) for the duration of the  Covid-19 declaration under Section 564(b)(1) of the Act, 21  U.S.C. section 360bbb-3(b)(1), unless the authorization is  terminated or revoked. Performed at [facility], [place] [address], [place], [place] [address]   MRSA PCR Screening     Status: None   Collection Time: 05/11/20 12:04 AM   Specimen: Nasal Mucosa; Nasopharyngeal  Result Value Ref Range Status   MRSA by PCR NEGATIVE NEGATIVE Final    Comment:        The GeneXpert MRSA Assay (FDA approved for NASAL specimens only), is one component of a comprehensive MRSA colonization surveillance program. It is not intended to diagnose MRSA infection nor to guide or monitor treatment for MRSA infections. Performed at [facility], [place] [address] [place], [place] [address]      Radiology Studies:  [facility] Chest Port 1 View  Result Date: 05/10/2020 CLINICAL DATA:  Shortness of breath EXAM: PORTABLE CHEST 1 VIEW COMPARISON:  04/28/2020 FINDINGS: Cardiac shadow is stable. Lungs are well aerated bilaterally. Bibasilar atelectatic changes are seen. No sizable effusion is noted. No bony abnormality is seen. IMPRESSION: Mild bibasilar atelectatic changes. Electronically Signed   By: Inez Catalina M.D.   On: 05/10/2020 20:21     Scheduled Meds:   . Chlorhexidine Gluconate Cloth  6 each Topical Daily  . insulin aspart  0-9 Units Subcutaneous Q4H  . ipratropium-albuterol  3 mL Nebulization Q4H  . mouth rinse  15 mL Mouth Rinse BID  . methylPREDNISolone (SOLU-MEDROL) injection  40 mg Intravenous Q12H    Continuous Infusions:   . heparin 1,250 Units/hr (05/11/20 1000)      LOS: 1 day     Vernell Leep, MD, [place], [facility]. [facility]    To contact the attending provider between 7A-7P or the covering provider during after hours 7P-7A, please log into the  web site [email or web address] and access using universal [place] password for that web site. If you do not have the password, please call the hospital operator.  05/11/2020, 11:56 AM

## 2020-05-11 NOTE — Progress Notes (Signed)
RN called to patient's room reporting dizziness up in chair. BP checked and read with systolic in 08H, confirmed manually. ST alarm ringing out on monitor, EKG obtained. Patient denies chest pain. Patient transferred back to bed and MD notified. 500cc bolus started per order, BP returning to baseline while supine in bed. Will continue to monitor.

## 2020-05-11 NOTE — Progress Notes (Signed)
Addendum:  Issues with intermittent hypotension. Had hypotension earlier this morning after sitting in a chair for some time.  He was bolused with IV fluids and placed back in bed with improvement. RN just paged to indicate that he is currently in bed but BP again is 79/43 and complaints of occasional mild dizziness.  Also complains of ongoing right leg generalized pain.  Reports having fallen on that leg but no specific localization of pain.  She indicates good peripheral pulses in that extremity and no swelling or external evidence of injuries. Repeat normal saline bolus 500 mL x 1 followed by maintenance IV fluids at 125 mL/h.  Check random cortisol but patient already on IV Solu-Medrol. Reviewed lower extremity venous Doppler results from 10/2: Had acute DVT involving the right common femoral vein and right posterior tibial vein.  Thrombus in the right common femoral vein was mobile.  This could be causing his pain which he reports he has had for some time now.  Would like to avoid opioids if at all possible due to his recurrent presentation with acute encephalopathy from hypercapnic respiratory failure.  Vernell Leep, MD, [place], [facility]. [facility]  To contact the attending provider between 7A-7P or the covering provider during after hours 7P-7A, please log into the web site [email or web address] and access using universal [place] password for that web site. If you do not have the password, please call the hospital operator.

## 2020-05-11 NOTE — Progress Notes (Signed)
NAME:  Jay Holloway, MRN:  [id number], DOB:  1945-09-30, LOS: 1 ADMISSION DATE:  05/10/2020, CONSULTATION DATE: 05/11/2020 REFERRING MD: Tera Partridge MD, CHIEF COMPLAINT: Hypercarbic respiratory failure  Brief History   74 year old with multiple medical issues including diabetes, CHF, hypertension, GERD with recurrent admissions for encephalopathy, hypoxic hypercarbic respiratory failure. PCCM consulted to evaluate need for noninvasive ventilation at home  Past Medical History    has a past medical history of Arthritis, Diabetes mellitus without complication ([place]), Hemorrhoids, High cholesterol, Hypertension, and Prostate cancer ([place]).  Significant Hospital Events   10/13 Admit  Consults:  PCCM  Procedures:    Significant Diagnostic Tests:  Chest x-ray 05/10/2020-mild bibasal atelectasis  Micro Data:    Antimicrobials:    Interim history/subjective:    Objective   Blood pressure (!) 105/57, pulse (!) 106, temperature 98.1 F (36.7 C), temperature source Axillary, resp. rate 15, height 6' (1.829 m), weight 94.3 kg, SpO2 100 %.    FiO2 (%):  [28 %] 28 %   Intake/Output Summary (Last 24 hours) at 05/11/2020 1613 Last data filed at 05/11/2020 1400 Gross per 24 hour  Intake 455.63 ml  Output 50 ml  Net 405.63 ml   Filed Weights   05/10/20 1907 05/10/20 2355  Weight: 96 kg 94.3 kg    Examination: Gen:      No acute distress chronically ill-appearing HEENT:  EOMI, sclera anicteric Neck:     No masses; no thyromegaly Lungs:    Clear to auscultation bilaterally; normal respiratory effort CV:         Regular rate and rhythm; no murmurs Abd:      + bowel sounds; soft, non-tender; no palpable masses, no distension Ext:   1+ edema; adequate peripheral perfusion Skin:      Warm and dry; no rash Neuro: alert and oriented x 3 Psych: normal mood and affect  Resolved Hospital Problem list     Assessment & Plan:  Recurrent hypoxic, hypercarbic respiratory failure He  has COPD but per wife he does not have significant smoking history and quit many years ago May have undiagnosed OSA Agree with steroids, bronchodilators Will need PFTs and sleep study as an outpatient but I am not sure if he can get to that point for follow-up in the clinic and if he will be able to cooperate with the test Agree with continuing noninvasive ventilation He would be a good candidate for trilogy vent and will need a social work consult to get this arranged.  Best practice:  Per primary team  Labs   CBC: Recent Labs  Lab 05/10/20 1945 05/11/20 0414 05/11/20 0808  WBC 10.8* 10.3  --   NEUTROABS 7.8*  --   --   HGB 10.0* 9.5* 10.2*  HCT 35.5* 32.3* 30.0*  MCV 98.6 97.0  --   PLT 530* 508*  --     Basic Metabolic Panel: Recent Labs  Lab 05/10/20 1945 05/11/20 0414 05/11/20 0808  NA 143 144 140  K 5.3* 5.1 5.3*  CL 98 96*  --   CO2 35* 34*  --   GLUCOSE 159* 173*  --   BUN 12 19  --   CREATININE 0.91 0.96  --   CALCIUM 8.6* 8.8*  --    GFR: Estimated Creatinine Clearance: 81.7 mL/min (by C-G formula based on SCr of 0.96 mg/dL). Recent Labs  Lab 05/10/20 1945 05/11/20 0414  WBC 10.8* 10.3    Liver Function Tests: Recent Labs  Lab 05/10/20 1945  05/11/20 0414  AST 20 15  ALT 21 19  ALKPHOS 104 94  BILITOT 1.1 1.5*  PROT 6.1* 6.0*  ALBUMIN 2.8* 2.6*   No results for input(s): LIPASE, AMYLASE in the last 168 hours. Recent Labs  Lab 05/11/20 0809  AMMONIA 15    ABG    Component Value Date/Time   PHART 7.438 05/11/2020 0808   PCO2ART 60.1 (H) 05/11/2020 0808   PO2ART 151 (H) 05/11/2020 0808   HCO3 40.6 (H) 05/11/2020 0808   TCO2 42 (H) 05/11/2020 0808   ACIDBASEDEF 10.1 (H) 11/06/2014 0335   O2SAT 99.0 05/11/2020 0808     Coagulation Profile: No results for input(s): INR, PROTIME in the last 168 hours.  Cardiac Enzymes: No results for input(s): CKTOTAL, CKMB, CKMBINDEX, TROPONINI in the last 168 hours.  HbA1C: Hgb A1c MFr Bld   Date/Time Value Ref Range Status  05/03/2020 05:26 AM 7.8 (H) 4.8 - 5.6 % Final    Comment:    (NOTE) Pre diabetes:          5.7%-6.4%  Diabetes:              >6.4%  Glycemic control for   <7.0% adults with diabetes   03/21/2020 05:00 PM 6.2 (H) 4.8 - 5.6 % Final    Comment:    RESULTS CONFIRMED BY MANUAL DILUTION REPEATED TO VERIFY (NOTE) Pre diabetes:          5.7%-6.4%  Diabetes:              >6.4%  Glycemic control for   <7.0% adults with diabetes     CBG: Recent Labs  Lab 05/10/20 2346 05/11/20 0336 05/11/20 0749 05/11/20 1140 05/11/20 1526  GLUCAP 135* 159* 144* 115* 147*    Review of Systems:   REVIEW OF SYSTEMS:   All negative; except for those that are bolded, which indicate positives.  Constitutional: weight loss, weight gain, night sweats, fevers, chills, fatigue, weakness.  HEENT: headaches, sore throat, sneezing, nasal congestion, post nasal drip, difficulty swallowing, tooth/dental problems, visual complaints, visual changes, ear aches. Neuro: difficulty with speech, weakness, numbness, ataxia. CV:  chest pain, orthopnea, PND, swelling in lower extremities, dizziness, palpitations, syncope.  Resp: cough, hemoptysis, dyspnea, wheezing. GI: heartburn, indigestion, abdominal pain, nausea, vomiting, diarrhea, constipation, change in bowel habits, loss of appetite, hematemesis, melena, hematochezia.  GU: dysuria, change in color of urine, urgency or frequency, flank pain, hematuria. MSK: joint pain or swelling, decreased range of motion. Psych: change in mood or affect, depression, anxiety, suicidal ideations, homicidal ideations. Skin: rash, itching, bruising.  Past Medical History  He,  has a past medical history of Arthritis, Diabetes mellitus without complication ([place]), Hemorrhoids, High cholesterol, Hypertension, and Prostate cancer ([place]).   Surgical History    Past Surgical History:  Procedure Laterality Date  . CHOLECYSTECTOMY N/A 11/11/2014    Procedure: LAPAROSCOPIC CHOLECYSTECTOMY WITH INTRAOPERATIVE CHOLANGIOGRAM;  Surgeon: Erroll Luna, MD;  Location: [facility];  Service: General;  Laterality: N/A;  . COLONOSCOPY N/A 11/09/2014   Procedure: COLONOSCOPY;  Surgeon: Juanita Craver, MD;  Location: [facility] ENDOSCOPY;  Service: Endoscopy;  Laterality: N/A;  . CYSTOSCOPY WITH RETROGRADE PYELOGRAM, URETEROSCOPY AND STENT PLACEMENT Left 04/18/2014   Procedure: CYSTOSCOPY WITH RETROGRADE PYELOGRAM, URETEROSCOPY AND STENT PLACEMENT;  Surgeon: Raynelle Bring, MD;  Location: [facility];  Service: Urology;  Laterality: Left;  . HOLMIUM LASER APPLICATION Left 2/77/8242   Procedure: HOLMIUM LASER APPLICATION;  Surgeon: Raynelle Bring, MD;  Location: [facility];  Service: Urology;  Laterality:  Left;  . LYMPHADENECTOMY  06/04/2012   Procedure: LYMPHADENECTOMY;  Surgeon: Dutch Gray, MD;  Location: [facility];  Service: Urology;  Laterality: Bilateral;  . right  achilles tendon repair  yrs ago  . ROBOT ASSISTED LAPAROSCOPIC RADICAL PROSTATECTOMY  06/04/2012   Procedure: ROBOTIC ASSISTED LAPAROSCOPIC RADICAL PROSTATECTOMY LEVEL 3;  Surgeon: Dutch Gray, MD;  Location: [facility];  Service: Urology;  Laterality: N/A;     Social History   reports that he quit smoking about 41 years ago. His smoking use included cigarettes. He has a 1.00 pack-year smoking history. He has never used smokeless tobacco. He reports previous alcohol use. He reports previous drug use. Drug: Marijuana.   Family History   His Family history is unknown by patient.   Allergies No Known Allergies   Home Medications  Prior to Admission medications   Medication Sig Start Date End Date Taking? Authorizing Provider  apixaban (ELIQUIS) 5 MG TABS tablet Take 1 tablet (5 mg total) by mouth 2 (two) times daily. 05/10/20 06/09/20 Yes Donne Hazel, MD  atorvastatin (LIPITOR) 10 MG tablet Take 10 mg by mouth daily.  04/21/18  Yes [provider]  Cholecalciferol (VITAMIN D3) 25 MCG (1000 UT) CAPS Take  1,000 Units by mouth daily.  10/15/18  Yes [provider]  feeding supplement, GLUCERNA SHAKE, (GLUCERNA SHAKE) LIQD Take 237 mLs by mouth 3 (three) times daily between meals.   Yes [provider]  furosemide (LASIX) 40 MG tablet Take 0.5 tablets (20 mg total) by mouth daily. fluid 03/24/20  Yes Swayze, Ava, DO  HYDROcodone-acetaminophen (NORCO) 7.5-325 MG tablet Take 1 tablet by mouth every 6 (six) hours as needed for moderate pain.   Yes [provider]  insulin glargine (LANTUS) 100 UNIT/ML injection Inject 0.1 mLs (10 Units total) into the skin daily. Give only 5 units for am glucose less than 90. 03/25/20  Yes Swayze, Ava, DO  melatonin 3 MG TABS tablet Take 1 tablet (3 mg total) by mouth at bedtime. 03/24/20  Yes Swayze, Ava, DO  metFORMIN (GLUCOPHAGE-XR) 500 MG 24 hr tablet Take 500 mg by mouth at bedtime.  10/15/18  Yes [provider]  Multiple Vitamins-Minerals (MULTIVITAMIN ADULTS) TABS Take 1 tablet by mouth daily.   Yes [provider]  ondansetron (ZOFRAN) 4 MG tablet Take 4 mg by mouth every 8 (eight) hours as needed for nausea or vomiting.   Yes [provider]  pantoprazole (PROTONIX) 40 MG tablet Take 1 tablet (40 mg total) by mouth 2 (two) times daily. 03/24/20  Yes Swayze, Ava, DO  potassium chloride 20 MEQ TBCR Take 20 mEq by mouth daily. 03/24/20 05/10/20 Yes Swayze, Ava, DO  Semaglutide (OZEMPIC, 1 MG/DOSE, [place]) Inject 1 mg into the skin once a week. Wed   Yes [provider]  traMADol (ULTRAM) 50 MG tablet Take 1 tablet (50 mg total) by mouth every 12 (twelve) hours as needed for moderate pain or severe pain. 05/05/20  Yes Donne Hazel, MD  apixaban (ELIQUIS) 5 MG TABS tablet Take 2 tablets (10 mg total) by mouth 2 (two) times daily for 4 days. Patient not taking: Reported on 05/10/2020 05/05/20 05/09/20  Donne Hazel, MD     Critical care time: NA   Marshell Garfinkel MD [facility] Please  see [email or web address] for pager details.  05/11/2020, 4:35 PM

## 2020-05-11 NOTE — Progress Notes (Signed)
[facility] for Heparin (on apixaban PTA) Indication: pulmonary embolus  No Known Allergies  Patient Measurements: Height: 6' (182.9 cm) Weight: 94.3 kg (207 lb 14.3 oz) IBW/kg (Calculated) : 77.6 Heparin Dosing Weight: actual body weight  Vital Signs: Temp: 97.2 F (36.2 C) (10/14 0342) Temp Source: Axillary (10/14 0342) BP: 98/48 (10/14 0401) Pulse Rate: 99 (10/14 0401)  Labs: Recent Labs    05/10/20 1945 05/11/20 0414  HGB 10.0* 9.5*  HCT 35.5* 32.3*  PLT 530* 508*  APTT 29 144*  HEPARINUNFRC >2.20*  --   CREATININE 0.91 0.96    Estimated Creatinine Clearance: 81.7 mL/min (by C-G formula based on SCr of 0.96 mg/dL).   Medical History: Past Medical History:  Diagnosis Date  . Arthritis    trouble turning head side to side and up  . Diabetes mellitus without complication ([place])   . Hemorrhoids   . High cholesterol   . Hypertension   . Prostate cancer ([place])     Medications:  PTA Eliquis   Assessment:  74 yr male admitted with acute respiratory failure and AMS  PMH significant for prostate cancer, dementia, COPD. Recent hospitalization where was found to have a femoral thrombus and PE.   Pharmacy consulted to dose IV heparin  Patient was on Eliquis 10mg  po BID x 7 days and then to decrease Eliquis to 5mg  po BID today (10/13).    Last dose of Eliquis = 10mg  on 10/12 @ 1800  No Eliquis taken today  Due to Eliquis' effect on falsely elevating heparin level, will monitor IV heparin with aPTT and HL until levels correlate  05/11/20  APTT = 144 sec (supratherapeutic) with heparin @ 1500 units/hr  AM HL- pending  CBC low but stable  No bleeding or complications of therapy per RN  Baseline HL > 2.2 (falsely elevated due to effects of PTA apixaban)  Goal of Therapy:  Heparin level 0.3-0.7 units/ml aPTT 66-102 seconds Monitor platelets by anticoagulation protocol: Yes   Plan:   Hold heparin infusion x 1 hr  then resume heparin @ 1250 units/hr  Check aPTT 8 hr after heparin resumed at reduced rate  Follow HL and CBC daily while on heparin gtt  Devonia Farro, Toribio [place], PharmD 05/11/2020,5:16 AM

## 2020-05-11 NOTE — TOC Initial Note (Signed)
Transition of Care [facility]) - Initial/Assessment Note    Patient Details  Name: Jay Holloway MRN: [id number] Date of Birth: 08/24/1945  Transition of [facility] CM/SW Contact:    Leeroy Cha, RN Phone Number: 05/11/2020, 8:15 AM  Clinical Narrative:                  74 y.o. male with history of diabetes mellitus type 2, hypertension, prostate cancer who has been recently admitted for hypercarbic respiratory failure possibly from undiagnosed COPD/sleep apnea prior to which patient also was admitted for DKA about few weeks ago and also recently diagnosed with DVT/PE on apixaban was found to be lethargic and was brought to the ER.  Patient had a similar picture of presentation last admission during last week.  No much further history is available.  Did discuss with patient's nephew who said that patient was found to be lethargic and was brought to the ER.  ED Course: In the ER patient is responding to his name but otherwise lethargic.  Pupils are reacting to light and ABG is significant for severe respiratory acidosis with PCO2 more than 120 and pH of 7.08.  I confirmed with patient's nephew about DNR status.  As per the nephew patient is okay to be placed on BiPAP and medications but no chest compressions or intubations.  Patient is afebrile chest x-ray unremarkable EKG shows normal sinus rhythm labs show hemoglobin of 10 WBC of 10.8 BNP of 140.8.  Potassium is 5.3.  Covid test is negative. Lethargic,[place] at 2l/min, iv heparin, from [facility] health care. Expected Discharge Plan: [place] (from [facility]) Barriers to Discharge: Barriers Unresolved (comment)   Patient Goals and CMS Choice Patient states their goals for this hospitalization and ongoing recovery are:: unable to state      Expected Discharge Plan and Services Expected Discharge Plan: [place] (from [facility])   Discharge Planning Services: CM Consult   Living arrangements for the past 2 months: [place]                                      Prior Living Arrangements/Services Living arrangements for the past 2 months: [place] Lives with:: Facility Resident Patient language and need for interpreter reviewed:: Yes Do you feel safe going back to the place where you live?: Yes      Need for Family Participation in Patient Care: Yes (Comment) Care giver support system in place?: Yes (comment)   Criminal Activity/Legal Involvement Pertinent to Current Situation/Hospitalization: No - Comment as needed  Activities of Daily Living      Permission Sought/Granted                  Emotional Assessment Appearance:: Appears stated age Attitude/Demeanor/Rapport: Unable to Assess Affect (typically observed): Unable to Assess Orientation: : Fluctuating Orientation (Suspected and/or reported Sundowners) Alcohol / Substance Use: Not Applicable Psych Involvement: No (comment)  Admission diagnosis:  Acute respiratory failure with hypoxia and hypercapnia (HCC) [J96.01, J96.02] Acute respiratory failure ([place]) [J96.00] Patient Active Problem List   Diagnosis Date Noted  . Acute respiratory failure with hypoxia and hypercapnia ([place]) 05/10/2020  . Acute respiratory failure ([place]) 05/10/2020  . Hypomagnesemia 04/28/2020  . Mixed diabetic hyperlipidemia associated with type 2 diabetes mellitus ([place]) 04/28/2020  . Pressure injury of sacral region, stage 2 ([place]) 04/19/2020  . Acute diastolic CHF (congestive heart failure) ([place])  04/19/2020  . Acute respiratory failure with hypoxia and hypercarbia ([place]) 04/17/2020  . Hypernatremia 04/17/2020  . Dysphagia 04/17/2020  . Acute metabolic encephalopathy 28/78/6767  . CKD (chronic kidney disease), stage II 04/17/2020  . Hyperkalemia 04/14/2020  . GERD without esophagitis 04/14/2020  . Slurred speech 04/14/2020  . Generalized weakness 04/14/2020  . AKI (acute kidney injury) ([place]) 03/21/2020  . Prostate cancer  ([place])   . Essential hypertension   . Arterial hypotension   . Renal failure   . Type 2 diabetes mellitus with hyperglycemia, with long-term current use of insulin ([place])   . Weakness   . DKA (diabetic ketoacidoses) 11/05/2014   PCP:  [name], MD Pharmacy:   [facility] [place], [address] [place] [address] Phone: [phone] Fax: [phone]     Social Determinants of Health (SDOH) Interventions    Readmission Risk Interventions No flowsheet data found.

## 2020-05-12 DIAGNOSIS — J9601 Acute respiratory failure with hypoxia: Secondary | ICD-10-CM | POA: Diagnosis not present

## 2020-05-12 DIAGNOSIS — G9341 Metabolic encephalopathy: Secondary | ICD-10-CM | POA: Diagnosis not present

## 2020-05-12 DIAGNOSIS — J9602 Acute respiratory failure with hypercapnia: Secondary | ICD-10-CM | POA: Diagnosis not present

## 2020-05-12 LAB — BASIC METABOLIC PANEL
Anion gap: 9 (ref 5–15)
BUN: 26 mg/dL — ABNORMAL HIGH (ref 8–23)
CO2: 33 mmol/L — ABNORMAL HIGH (ref 22–32)
Calcium: 7.9 mg/dL — ABNORMAL LOW (ref 8.9–10.3)
Chloride: 98 mmol/L (ref 98–111)
Creatinine, Ser: 1.92 mg/dL — ABNORMAL HIGH (ref 0.61–1.24)
GFR, Estimated: 34 mL/min — ABNORMAL LOW (ref >60)
Glucose, Bld: 266 mg/dL — ABNORMAL HIGH (ref 70–99)
Potassium: 5.4 mmol/L — ABNORMAL HIGH (ref 3.5–5.1)
Sodium: 140 mmol/L (ref 135–145)

## 2020-05-12 LAB — GLUCOSE, CAPILLARY
Glucose-Capillary: 247 mg/dL — ABNORMAL HIGH (ref 70–99)
Glucose-Capillary: 231 mg/dL — ABNORMAL HIGH (ref 70–99)
Glucose-Capillary: 224 mg/dL — ABNORMAL HIGH (ref 70–99)
Glucose-Capillary: 233 mg/dL — ABNORMAL HIGH (ref 70–99)

## 2020-05-12 LAB — CBC
HCT: 25.6 % — ABNORMAL LOW (ref 39.0–52.0)
Hemoglobin: 7.8 g/dL — ABNORMAL LOW (ref 13.0–17.0)
MCH: 28.6 pg (ref 26.0–34.0)
MCHC: 30.5 g/dL (ref 30.0–36.0)
MCV: 93.8 fL (ref 80.0–100.0)
Platelets: 421 10*3/uL — ABNORMAL HIGH (ref 150–400)
RBC: 2.73 MIL/uL — ABNORMAL LOW (ref 4.22–5.81)
RDW: 16.6 % — ABNORMAL HIGH (ref 11.5–15.5)
WBC: 11.7 10*3/uL — ABNORMAL HIGH (ref 4.0–10.5)
nRBC: 0 % (ref 0.0–0.2)

## 2020-05-12 LAB — CORTISOL: Cortisol, Plasma: 8.2 ug/dL

## 2020-05-12 MED ORDER — PREDNISONE 20 MG PO TABS
40.0000 mg | ORAL_TABLET | Freq: Every day | ORAL | Status: AC
  Administered 2020-05-13 – 2020-05-15 (×3): 40 mg via ORAL
  Filled 2020-05-12 (×3): qty 2

## 2020-05-12 MED ORDER — TRAMADOL HCL 50 MG PO TABS
50.0000 mg | ORAL_TABLET | Freq: Four times a day (QID) | ORAL | Status: DC | PRN
  Administered 2020-05-12 – 2020-05-15 (×9): 50 mg via ORAL
  Filled 2020-05-12 (×10): qty 1

## 2020-05-12 MED ORDER — INSULIN GLARGINE 100 UNIT/ML ~~LOC~~ SOLN
10.0000 [IU] | Freq: Every day | SUBCUTANEOUS | Status: DC
  Administered 2020-05-12 – 2020-05-18 (×7): 10 [IU] via SUBCUTANEOUS
  Filled 2020-05-12 (×7): qty 0.1

## 2020-05-12 MED ORDER — ENSURE ENLIVE PO LIQD
237.0000 mL | Freq: Two times a day (BID) | ORAL | Status: DC
  Administered 2020-05-13 – 2020-05-17 (×6): 237 mL via ORAL

## 2020-05-12 NOTE — Evaluation (Signed)
Occupational Therapy Evaluation Patient Details Name: Jay Holloway MRN: [id number] DOB: [birth year] Today's Date: 05/12/2020    History of Present Illness 74 year old male with PMH of DM2, HTN, prostate cancer, recent hospitalization for hypercarbic respiratory failure possibly from undiagnosed COPD/sleep apnea, prior to that also admitted for DKA, recently diagnosed with DVT/PE on apixaban, sent to the ED from SNF where he was found to be lethargic.  recently DC back to [facility] after hodpitalization for acute hypoxic hypercapneic respiratory failure.  Admitted to stepdown unit for acute on chronic hypercarbic and hypoxic respiratory failure with associated acute metabolic encephalopathy.  REview of RLE HUD:JSHFWYOV lower extremity venous Doppler results from 10/2: Had acute DVT involving the right common femoral vein and right posterior tibial vein.  Thrombus in the right common femoral vein was mobile.   Clinical Impression   Jay Holloway is a 74 year old man who presents with generalized weakness, decreased right quad activation, decreased activity tolerance, impaired balance and complaints of RLE pain resulting in decreased ability to perform functional mobility and ADLs. Patient max assist x 2 to transfer to side of bed and total assist to stand with RW and ultimately needing to scoot to recliner. Patient max -total assist for LB ADLs and toileting. Patient will benefit from skilled OT services to improve deficits and learn compensatory strategies as needed for ADLs in order to improve functional abilities.     Follow Up Recommendations  SNF    Equipment Recommendations   (defer to next venue)    Recommendations for Other Services       Precautions / Restrictions Precautions Precautions: Fall Precaution Comments: R quads poorly activating, will buckle Restrictions Weight Bearing Restrictions: No      Mobility Bed Mobility Overal bed mobility: Needs Assistance Bed  Mobility: Supine to Sit Rolling: Mod assist;+2 for physical assistance;+2 for safety/equipment   Supine to sit: Max assist;+2 for physical assistance Sit to supine: Max assist;+2 for physical assistance   General bed mobility comments: patient did initiate moving legs toward bed edge, required more assistance with right leg, Assisted with trunk to sitting upright. Once sitting , patient could use UE's to scoot to bed edge  Transfers Overall transfer level: Needs assistance Equipment used: Rolling walker (2 wheeled) Transfers: Lateral/Scoot Transfers;Sit to/from Stand Sit to Stand: +2 physical assistance;+2 safety/equipment;From elevated surface;Max assist        Lateral/Scoot Transfers: Total assist General transfer comment: with 3 persons, patient assisted to stand from raised bed, 1 person blocking right knee as  it buckles. Decreased pushing into RW. Stood x 2 briefly. Lateral scoot transfer to recliner in multiple scoots, verbal cues.    Balance Overall balance assessment: Needs assistance Sitting-balance support: Feet supported;No upper extremity supported Sitting balance-Leahy Scale: Good Sitting balance - Comments: sits near midline withput leaning   Standing balance support: During functional activity;Bilateral upper extremity supported Standing balance-Leahy Scale: Poor Standing balance comment: total assistance at Rw for a few seconds                           ADL either performed or assessed with clinical judgement   ADL Overall ADL's : Needs assistance/impaired Eating/Feeding: Set up;Sitting   Grooming: Set up;Sitting   Upper Body Bathing: Set up;Sitting   Lower Body Bathing: Maximal assistance;Sitting/lateral leans   Upper Body Dressing : Set up;Sitting   Lower Body Dressing: Total assistance;+2 for physical assistance;Sit to/from stand;+2 for safety/equipment   Toilet Transfer: +  2 for physical assistance;+2 for  safety/equipment;BSC;Stand-pivot;Requires drop arm   Toileting- Clothing Manipulation and Hygiene: Total assistance;Sitting/lateral lean               Vision Patient Visual Report: No change from baseline       Perception     Praxis      Pertinent Vitals/Pain Pain Assessment: Faces Faces Pain Scale: Hurts even more Pain Location: right thigh Pain Descriptors / Indicators: Sore Pain Intervention(s): Monitored during session;Limited activity within patient's tolerance     Hand Dominance Right   Extremity/Trunk Assessment Upper Extremity Assessment Upper Extremity Assessment: RUE deficits/detail;LUE deficits/detail RUE Deficits / Details: 4/5 shoulder, elbow 4/5, wrist 5/5, 3+/5 grip LUE Deficits / Details: 4+/5 shoulder, 4+/5 elbow, wrist 5/5, 3+/5 grip   Lower Extremity Assessment Lower Extremity Assessment: Defer to PT evaluation RLE Deficits / Details: knee ext 1/5, reports decreased sensation on thigh to knee. Can palpate quad tendon activation LLE Deficits / Details: grossly 4- knee ext and hip flexion   Cervical / Trunk Assessment Cervical / Trunk Assessment: Kyphotic   Communication Communication Communication: No difficulties   Cognition Arousal/Alertness: Awake/alert Behavior During Therapy: WFL for tasks assessed/performed;Flat affect Overall Cognitive Status: Within Functional Limits for tasks assessed                               Problem Solving: Slow processing;Decreased initiation General Comments: patient alert and able to follow directions   General Comments       Exercises     Shoulder Instructions      Home Living Family/patient expects to be discharged to:: Skilled nursing facility                                        Prior Functioning/Environment                   OT Problem List: Decreased strength;Decreased range of motion;Decreased activity tolerance;Impaired balance (sitting and/or  standing);Decreased knowledge of use of DME or AE;Decreased cognition;Decreased knowledge of precautions;Pain;Increased edema      OT Treatment/Interventions: Self-care/ADL training;Therapeutic exercise;Energy conservation;DME and/or AE instruction;Therapeutic activities;Patient/family education    OT Goals(Current goals can be found in the care plan section) Acute Rehab OT Goals Patient Stated Goal: to get stronger OT Goal Formulation: With patient Time For Goal Achievement: 05/26/20 Potential to Achieve Goals: Good  OT Frequency: Min 2X/week   Barriers to D/C:            Co-evaluation PT/OT/SLP Co-Evaluation/Treatment: Yes Reason for Co-Treatment: For patient/therapist safety;To address functional/ADL transfers PT goals addressed during session: Mobility/safety with mobility OT goals addressed during session: ADL's and self-care      AM-PAC OT "6 Clicks" Daily Activity     Outcome Measure Help from another person eating meals?: A Little Help from another person taking care of personal grooming?: A Little Help from another person toileting, which includes using toliet, bedpan, or urinal?: Total Help from another person bathing (including washing, rinsing, drying)?: A Lot Help from another person to put on and taking off regular upper body clothing?: A Little Help from another person to put on and taking off regular lower body clothing?: Total 6 Click Score: 13   End of Session Equipment Utilized During Treatment: Gait belt;Rolling walker Nurse Communication: Mobility status  Activity Tolerance: Patient tolerated treatment well Patient  left: in chair;with call bell/phone within reach;with chair alarm set;with nursing/sitter in room  OT Visit Diagnosis: Unsteadiness on feet (R26.81);Other abnormalities of gait and mobility (R26.89);Muscle weakness (generalized) (M62.81);Pain Pain - Right/Left: Right Pain - part of body: Knee                Time: 1130-1204 OT Time  Calculation (min): 34 min Charges:  OT General Charges $OT Visit: 1 Visit OT Evaluation $OT Eval Moderate Complexity: 1 Mod  Lai Hendriks, OTR/L [place]  Office [phone] Pager: [phone]   Lenward Chancellor 05/12/2020, 1:47 PM

## 2020-05-12 NOTE — Progress Notes (Addendum)
PROGRESS NOTE   Jay Holloway  SNK:[id number]    DOB: 06-10-1946    DOA: 05/10/2020  PCP: [name], MD   I have briefly reviewed patients previous medical records in [facility].  Chief Complaint  Patient presents with  . Altered Mental Status    Brief Narrative:  74 year old male with PMH of DM2, HTN, prostate cancer, recent hospitalization for hypercarbic respiratory failure possibly from undiagnosed COPD/sleep apnea, prior to that also admitted for DKA, recently diagnosed with DVT/PE on apixaban, sent to the ED from SNF where he was found to be lethargic.  He reportedly had a similar presentation last admission.  Admitted to stepdown unit for acute on chronic hypercarbic and hypoxic respiratory failure with associated acute metabolic encephalopathy.  He required BiPAP overnight of admission with improvement in his ABG/reduction in PCO2 and resolution of mental status changes.  Pulmonology consulted and feel that he will benefit from trilogy/BiPAP at night, they plan to coordinate this with TOC.   Assessment & Plan:  Principal Problem:   Acute respiratory failure with hypoxia and hypercapnia (HCC) Active Problems:   Type 2 diabetes mellitus with hyperglycemia, with long-term current use of insulin (HCC)   Prostate cancer (HCC)   Acute metabolic encephalopathy   CKD (chronic kidney disease), stage II   Pressure injury of sacral region, stage 2 (HCC)   Acute respiratory failure (HCC)   Acute on chronic respiratory failure with hypoxia and hypercarbia/possible COPD exacerbation:  Appears to be a recurrent presentation.  VBG on admission: pH 7.081, PCO2 >120, PO2 42.5, HCO3 37.4 and oxygen saturation 57.4.  Placed on BiPAP overnight.  Mental status significantly improved, alert and oriented x2 this morning.  ABG this morning: pH 7.438, PCO2 60.1, PO2 151, HCO3 40.6 and oxygen saturation 99%.  Transitioned from BiPAP to prior home level of oxygen 2 L/min via nasal cannula.  BiPAP at bedtime and as needed for AMS.  Suspect chronic CO2 retainer.  UDS negative.  Ammonia level normal.  Pulmonology consultation appreciated.  They suspect that he has COPD and undiagnosed OSA and recommend: Steroids, bronchodilators, PFT and sleep study as outpatient although they are not sure if he will get to that point for follow-up in the clinic and be able to cooperate for the test, noninvasive ventilation and would be a good candidate for trilogy vent  Tolerated BiPAP last night.  COPD exacerbation:  Mild.  Received Solu-Medrol 125 mg x 1 in ED.  We will continue Solu-Medrol 40 mg every 12 hourly for today then reassess in a.m. for possible transitioning to oral prednisone.  Bronchodilator nebs.  Flutter valve.  No clinical concern for infectious etiology and recently completed a course of antibiotics for pneumonia.  Improving, wean off steroids, changed to oral prednisone 10/16.  Possible OSA  Management as outlined above.  Acute metabolic encephalopathy:  Secondary to hypercarbic and hypoxic respiratory failure.  Resolved.  Avoid sedatives and narcotics as much as possible.  Acute kidney injury:  Creatinine has jumped up from 0.96-1.92 this morning.  Likely related to hypotension/possible ATN on 10/14.  Clinically volume overloaded, IVF stopped, low-dose Lasix initiated for edema.  Had an episode of urinary retention/in and out cath revealed 500 mL.  Nonoliguric.  Do not suspect that he is obstructed.  If creatinine does not start improving by tomorrow then consider renal ultrasound.  Acute urinary retention:  S/p in and out cath x1 yielding 500 mL.  Continue to monitor closely and hopefully does not need further catheterization  to avoid complications.  May consider Flomax but avoiding for now given recent hypotension issues.  Mild hyperkalemia  Unclear etiology.  Potassium up to 5.4 this morning.  Likely related to new acute kidney injury.  Has been  started on low-dose Lasix by PCCM for volume overload which should help.  Low potassium diet.  Had large volume diarrhea x2 this morning and should help.  Follow BMP in a.m.  Diarrhea:  May be self-limiting?  Related to Glucerna.  Monitor for now.  Has not had any further episodes since this morning.  Type II DM:  Continue SSI and monitor CBGs closely.   Recent admission for DKA.  Consulted diabetes coordinator.  Resume low-dose Lantus.  Glucerna was likely causing diarrhea, stopped and now on Ensure but not sure if he will even drink this-refusing.  Recent RLE DVT/PE:  Due to AMS on admission, was placed on IV heparin drip and now transitioned back to prior Eliquis but need to monitor renal insufficiency closely.  Essential hypertension/hypotension:  Was hypotensive a couple times on 10/14.  Hydrated with IV fluids.  Resolved.  BP up in the 120s/60s-70s range since morning.  If has recurrent hypotension then can consider midodrine.  Random cortisol was 8.2.  Anemia of chronic disease  Hemoglobin has dropped from 9-10 point range to 7.8.  No overt bleeding.?  Hemodilution.  Follow CBC in a.m.  Transfuse if hemoglobin 7 g or less.  Chronic pain:  Reports longstanding history of bilateral lower extremity pain.  No localizing pain reports diffuse pain.  Indicates that he has been on oxycodone for a long time.  Started tramadol.  Adult failure to thrive  Multifactorial due to significant comorbidities.  PT and OT evaluation recommend SNF.   Pressure Injury 05/10/20 Coccyx Right Stage 2 -  Partial thickness loss of dermis presenting as a shallow open injury with a red, pink wound bed without slough. (Active)  05/10/20 2300  Location: Coccyx  Location Orientation: Right  Staging: Stage 2 -  Partial thickness loss of dermis presenting as a shallow open injury with a red, pink wound bed without slough.  Wound Description (Comments):   Present on Admission: Yes      Pressure Injury 05/10/20 Coccyx Left Stage 2 -  Partial thickness loss of dermis presenting as a shallow open injury with a red, pink wound bed without slough. (Active)  05/10/20 2300  Location: Coccyx  Location Orientation: Left  Staging: Stage 2 -  Partial thickness loss of dermis presenting as a shallow open injury with a red, pink wound bed without slough.  Wound Description (Comments):   Present on Admission:      Pressure Injury 05/10/20 Heel Right Deep Tissue Pressure Injury - Purple or maroon localized area of discolored intact skin or blood-filled blister due to damage of underlying soft tissue from pressure and/or shear. (Active)  05/10/20 2300  Location: Heel  Location Orientation: Right  Staging: Deep Tissue Pressure Injury - Purple or maroon localized area of discolored intact skin or blood-filled blister due to damage of underlying soft tissue from pressure and/or shear.  Wound Description (Comments):   Present on Admission:          DVT prophylaxis:   IV heparin drip transitioned back to prior Eliquis.   Code Status: DNR  Family Communication: I discussed in detail with patient's nephew via phone, updated care and answered questions. Disposition:  Status is: Inpatient  Remains inpatient appropriate because:Inpatient level of care appropriate due to severity  of illness   Dispo: The patient is from: SNF              Anticipated d/c is to: SNF              Anticipated d/c date is: 2 days              Patient currently is not medically stable to d/c.        Consultants:   Pulmonology  Procedures:   BiPAP  Antimicrobials:    Anti-infectives (From admission, onward)   None        Subjective:  Denies dyspnea or chest pain.  No dizziness or lightheadedness.  Indicates diarrhea early this morning.  Ongoing pain in lower extremities.  Objective:   Vitals:   05/12/20 1214 05/12/20 1300 05/12/20 1400 05/12/20 1402  BP:  122/65 129/72   Pulse:  (!)  103 (!) 105   Resp:  13 16   Temp: 98.8 F (37.1 C)     TempSrc: Oral     SpO2:    99%  Weight:      Height:        General exam: Elderly male, moderately built and frail, chronically ill looking sitting up comfortably in bed without distress.  Looks much improved compared to yesterday. Respiratory system: Clear to auscultation.  No increased work of breathing. Cardiovascular system: S1 & S2 heard, RRR. No JVD, murmurs, rubs, gallops or clicks.  1+ pitting bilateral leg edema extending to mid thighs.  Telemetry personally reviewed: Mild sinus tachycardia in the low 100s with BBB morphology.  A brief episode of nonsustained SVT. Gastrointestinal system: Abdomen is nondistended, soft and nontender. No organomegaly or masses felt. Normal bowel sounds heard. Central nervous system: Alert and oriented x2. No focal neurological deficits. Extremities: Symmetric 5 x 5 power.  No acute findings.  Good peripheral pulses felt bilaterally. Skin: No rashes, lesions or ulcers Psychiatry: Judgement and insight impaired but reasonable. Mood & affect appropriate    Data Reviewed:   I have personally reviewed following labs and imaging studies   CBC: Recent Labs  Lab 05/10/20 1945 05/10/20 1945 05/11/20 0414 05/11/20 0808 05/12/20 0253  WBC 10.8*  --  10.3  --  11.7*  NEUTROABS 7.8*  --   --   --   --   HGB 10.0*   < > 9.5* 10.2* 7.8*  HCT 35.5*   < > 32.3* 30.0* 25.6*  MCV 98.6  --  97.0  --  93.8  PLT 530*  --  508*  --  421*   < > = values in this interval not displayed.    Basic Metabolic Panel: Recent Labs  Lab 05/10/20 1945 05/10/20 1945 05/11/20 0414 05/11/20 0808 05/12/20 0701  NA 143   < > 144 140 140  K 5.3*   < > 5.1 5.3* 5.4*  CL 98  --  96*  --  98  CO2 35*  --  34*  --  33*  GLUCOSE 159*  --  173*  --  266*  BUN 12  --  19  --  26*  CREATININE 0.91  --  0.96  --  1.92*  CALCIUM 8.6*  --  8.8*  --  7.9*   < > = values in this interval not displayed.    Liver  Function Tests: Recent Labs  Lab 05/10/20 1945 05/11/20 0414  AST 20 15  ALT 21 19  ALKPHOS 104 94  BILITOT 1.1  1.5*  PROT 6.1* 6.0*  ALBUMIN 2.8* 2.6*    CBG: Recent Labs  Lab 05/11/20 2337 05/12/20 0757 05/12/20 1204  GLUCAP 189* 247* 231*    Microbiology Studies:   Recent Results (from the past 240 hour(s))  SARS Coronavirus 2 by RT PCR (hospital order, performed in [facility] hospital lab) Nasopharyngeal Nasopharyngeal Swab     Status: None   Collection Time: 05/04/20  2:32 PM   Specimen: Nasopharyngeal Swab  Result Value Ref Range Status   SARS Coronavirus 2 NEGATIVE NEGATIVE Final    Comment: (NOTE) SARS-CoV-2 target nucleic acids are NOT DETECTED.  The SARS-CoV-2 RNA is generally detectable in upper and lower respiratory specimens during the acute phase of infection. The lowest concentration of SARS-CoV-2 viral copies this assay can detect is 250 copies / mL. A negative result does not preclude SARS-CoV-2 infection and should not be used as the sole basis for treatment or other patient management decisions.  A negative result may occur with improper specimen collection / handling, submission of specimen other than nasopharyngeal swab, presence of viral mutation(s) within the areas targeted by this assay, and inadequate number of viral copies (<250 copies / mL). A negative result must be combined with clinical observations, patient history, and epidemiological information.  Fact Sheet for Patients:   StrictlyIdeas.no  Fact Sheet for Healthcare Providers: BankingDealers.co.za  This test is not yet approved or  cleared by the [place] FDA and has been authorized for detection and/or diagnosis of SARS-CoV-2 by FDA under an Emergency Use Authorization (EUA).  This EUA will remain in effect (meaning this test can be used) for the duration of the COVID-19 declaration under Section 564(b)(1) of the Act, 21  U.S.C. section 360bbb-3(b)(1), unless the authorization is terminated or revoked sooner.  Performed at [facility], [place] [address], [place], [place] [address]   Respiratory Panel by RT PCR (Flu A&B, Covid) - Nasopharyngeal Swab     Status: None   Collection Time: 05/10/20  7:58 PM   Specimen: Nasopharyngeal Swab  Result Value Ref Range Status   SARS Coronavirus 2 by RT PCR NEGATIVE NEGATIVE Final    Comment: (NOTE) SARS-CoV-2 target nucleic acids are NOT DETECTED.  The SARS-CoV-2 RNA is generally detectable in upper respiratoy specimens during the acute phase of infection. The lowest concentration of SARS-CoV-2 viral copies this assay can detect is 131 copies/mL. A negative result does not preclude SARS-Cov-2 infection and should not be used as the sole basis for treatment or other patient management decisions. A negative result may occur with  improper specimen collection/handling, submission of specimen other than nasopharyngeal swab, presence of viral mutation(s) within the areas targeted by this assay, and inadequate number of viral copies (<131 copies/mL). A negative result must be combined with clinical observations, patient history, and epidemiological information. The expected result is Negative.  Fact Sheet for Patients:  PinkCheek.be  Fact Sheet for Healthcare Providers:  GravelBags.it  This test is no t yet approved or cleared by the [place] FDA and  has been authorized for detection and/or diagnosis of SARS-CoV-2 by FDA under an Emergency Use Authorization (EUA). This EUA will remain  in effect (meaning this test can be used) for the duration of the COVID-19 declaration under Section 564(b)(1) of the Act, 21 U.S.C. section 360bbb-3(b)(1), unless the authorization is terminated or revoked sooner.     Influenza A by PCR NEGATIVE NEGATIVE Final   Influenza B by PCR NEGATIVE NEGATIVE  Final  Comment: (NOTE) The Xpert Xpress SARS-CoV-2/FLU/RSV assay is intended as an aid in  the diagnosis of influenza from Nasopharyngeal swab specimens and  should not be used as a sole basis for treatment. Nasal washings and  aspirates are unacceptable for Xpert Xpress SARS-CoV-2/FLU/RSV  testing.  Fact Sheet for Patients: PinkCheek.be  Fact Sheet for Healthcare Providers: GravelBags.it  This test is not yet approved or cleared by the [place] FDA and  has been authorized for detection and/or diagnosis of SARS-CoV-2 by  FDA under an Emergency Use Authorization (EUA). This EUA will remain  in effect (meaning this test can be used) for the duration of the  Covid-19 declaration under Section 564(b)(1) of the Act, 21  U.S.C. section 360bbb-3(b)(1), unless the authorization is  terminated or revoked. Performed at [facility], [place] [address], [place], [place] [address]   MRSA PCR Screening     Status: None   Collection Time: 05/11/20 12:04 AM   Specimen: Nasal Mucosa; Nasopharyngeal  Result Value Ref Range Status   MRSA by PCR NEGATIVE NEGATIVE Final    Comment:        The GeneXpert MRSA Assay (FDA approved for NASAL specimens only), is one component of a comprehensive MRSA colonization surveillance program. It is not intended to diagnose MRSA infection nor to guide or monitor treatment for MRSA infections. Performed at [facility], [place] [address] [place], [place] [address]      Radiology Studies:  [facility] Chest Port 1 View  Result Date: 05/10/2020 CLINICAL DATA:  Shortness of breath EXAM: PORTABLE CHEST 1 VIEW COMPARISON:  04/28/2020 FINDINGS: Cardiac shadow is stable. Lungs are well aerated bilaterally. Bibasilar atelectatic changes are seen. No sizable effusion is noted. No bony abnormality is seen. IMPRESSION: Mild bibasilar atelectatic changes. Electronically Signed    By: Inez Catalina M.D.   On: 05/10/2020 20:21     Scheduled Meds:   . apixaban  5 mg Oral BID  . atorvastatin  10 mg Oral Daily  . Chlorhexidine Gluconate Cloth  6 each Topical Daily  . cholecalciferol  1,000 Units Oral Daily  . feeding supplement  237 mL Oral BID BM  . furosemide  20 mg Oral Daily  . insulin aspart  0-5 Units Subcutaneous QHS  . insulin aspart  0-9 Units Subcutaneous TID WC  . ipratropium-albuterol  3 mL Nebulization TID  . mouth rinse  15 mL Mouth Rinse BID  . methylPREDNISolone (SOLU-MEDROL) injection  40 mg Intravenous Q12H  . multivitamin  15 mL Oral Daily  . pantoprazole  40 mg Oral BID    Continuous Infusions:      LOS: 2 days     Vernell Leep, MD, [place], [facility]. [facility]    To contact the attending provider between 7A-7P or the covering provider during after hours 7P-7A, please log into the web site [email or web address] and access using universal [place] password for that web site. If you do not have the password, please call the hospital operator.  05/12/2020, 2:29 PM

## 2020-05-12 NOTE — Progress Notes (Signed)
NAME:  Jay Holloway, MRN:  [id number], DOB:  05-23-1946, LOS: 2 ADMISSION DATE:  05/10/2020, CONSULTATION DATE: 05/11/2020 REFERRING MD: Tera Partridge MD, CHIEF COMPLAINT: Hypercarbic respiratory failure  Brief History   74 year old with multiple medical issues including diabetes, CHF, hypertension, GERD with recurrent admissions for encephalopathy, hypoxic hypercarbic respiratory failure. PCCM consulted to evaluate need for noninvasive ventilation at home  Past Medical History    has a past medical history of Arthritis, Diabetes mellitus without complication ([place]), Hemorrhoids, High cholesterol, Hypertension, and Prostate cancer ([place]).  Significant Hospital Events   10/13 Admit 10/14 PCCM consult 4742 Had intermittent hypotension requiring IVF bolus.  Consults:  PCCM  Procedures:    Significant Diagnostic Tests:  Chest x-ray 05/10/2020-mild bibasal atelectasis  Micro Data:    Antimicrobials:    Interim history/subjective:   Has intermittent hypotension overnight requiring IV fluids  Objective   Blood pressure (!) 93/57, pulse (!) 107, temperature 97.8 F (36.6 C), temperature source Oral, resp. rate 18, height 6' (1.829 m), weight 94.3 kg, SpO2 97 %.    FiO2 (%):  [28 %] 28 %   Intake/Output Summary (Last 24 hours) at 05/12/2020 0919 Last data filed at 05/12/2020 0439 Gross per 24 hour  Intake 1795.95 ml  Output 1050 ml  Net 745.95 ml   Filed Weights   05/10/20 1907 05/10/20 2355  Weight: 96 kg 94.3 kg    Examination: Gen:      No acute distress, chronically ill-appearing HEENT:  EOMI, sclera anicteric Neck:     No masses; no thyromegaly Lungs:    Clear to auscultation bilaterally; normal respiratory effort CV:         Regular rate and rhythm; no murmurs Abd:      + bowel sounds; soft, non-tender; no palpable masses, no distension Ext:    1-2+ edema; adequate peripheral perfusion Skin:      Warm and dry; no rash Neuro: Awake, responsive with no focal  deficits  Labs/imaging reviewed Significant for BUN/creatinine 26/1.92, glucose 266 WBC 11.7, hemoglobin 7.8, platelets 421   Resolved Hospital Problem list     Assessment & Plan:  Recurrent hypoxic, hypercarbic respiratory failure He has COPD but per wife he does not have significant smoking history and quit many years ago May have undiagnosed OSA Agree with steroids, bronchodilators Will need PFTs and sleep study as an outpatient but I am not sure if he can get to that point for follow-up in the clinic and if he will be able to cooperate with the test Agree with continuing noninvasive ventilation He would be a good candidate for trilogy vent and will need a social work consult to get this arranged.  Patient continues to exhibit signs of hypercapnia associated with chronic respiratory failure secondary to COPD. Interruption or failure to provide NIV would quickly lead to exacerbation of the patient's condition, hospital admission, and likely harm the patient. Continued use is preferred. The use of the NIV will treat patient's high PCO2 levels and can reduce risk of exacerbations and future hospitalizations when used at night and during the day. BiLevel/RAD has been considered and ruled out as patient requires continuous alarms, backup rate, battery, and portability which are not possible with BiLevel/RAD devices. Ventilation is required to decrease the work of breathing and improve pulmonary status. Interruption of ventilator support would lead to decline of health status. Patient is able to protect their airways and clear secretions on their own.  Intermittent hypotension Unclear etiology We will stop IV fluids  as he appears volume overloaded Resume Lasix Can consider midodrine if his blood pressure drops again..  Lower extremity DVT, history of small PE Continue anticoagulation  Best practice:  Per primary team  Critical care time: NA   Marshell Garfinkel MD [facility] Please see [email or web address] for pager details.  05/12/2020, 9:19 AM

## 2020-05-12 NOTE — Progress Notes (Signed)
Initial Nutrition Assessment  DOCUMENTATION CODES:   Not applicable  INTERVENTION:   Recommend consult to Diabetes Coordinator  D/c Glucerna due to pt refusing  Ensure Enlive po BID, each supplement provides 350 kcal and 20 grams of protein  Magic cup BID with meals, each supplement provides 290 kcal and 9 grams of protein  Continue MVI  If pt's po intake does not improve and if aligned with GOC, consider initiation of nutrition support.   NUTRITION DIAGNOSIS:   Increased nutrient needs related to wound healing as evidenced by estimated needs.    GOAL:   Patient will meet greater than or equal to 90% of their needs    MONITOR:   PO intake, Supplement acceptance, Weight trends, I & O's, Skin, Labs  REASON FOR ASSESSMENT:   Malnutrition Screening Tool    ASSESSMENT:   Pt admitted with acute respiratory failure with hypoxia and hypercarbia requiring BiPAP. PMH includes recent diagnosis of DVT/PE, type 2 DM, CHF, HTN, prostate Ca, and recent admissions for hypercarbic respiratory failure (possibly from undiagnosed COPD/OSA) and DKA.    Pt has had intermittent hypotension requiring IVF bolus, but now appears volume overloaded. Lasix resumed. Per CCM, pt will need PFTs and sleep study as an outpatient and would be a good candidate for trilogy vent.   Pt's appetite is poor with 1-50% meal intake x3 documented meals (~33% average intake). Pt currently with orders for glucerna TID, but is noted to be refusing. Will trial additional supplements given pt's poor intake.   Reviewed wt history. No significant wt loss noted over the last year.   UOP: 1015ml x24 hours I/O: +2141.40ml since admit  Labs: K+ 5.4 (H), CBGs 189-247 Medications: vitamin D, lasix, ss novolog, solu-medrol, MVI, protonix  NUTRITION - FOCUSED PHYSICAL EXAM:  Unable to perform at this time. RD working from another campus. Will attempt at follow-up.  Diet Order:   Diet Order            DIET DYS 3  Room service appropriate? Yes; Fluid consistency: Thin  Diet effective now                 EDUCATION NEEDS:   No education needs have been identified at this time  Skin:  Skin Assessment: Skin Integrity Issues: Skin Integrity Issues:: Stage II, Unstageable, DTI DTI: R heel Stage II: R/L coccyx Unstageable: sacrum  Last BM:  10/15 type 6  Height:   Ht Readings from Last 1 Encounters:  05/10/20 6' (1.829 m)    Weight:   Wt Readings from Last 1 Encounters:  05/10/20 94.3 kg    Ideal Body Weight:  80.91 kg  BMI:  Body mass index is 28.2 kg/m.  Estimated Nutritional Needs:   Kcal:  2409-7353  Protein:  115-130 grams  Fluid:  >2L/d    Larkin Ina, MS, RD, LDN RD pager number and weekend/on-call pager number located in [facility].

## 2020-05-12 NOTE — Evaluation (Signed)
Physical Therapy Evaluation Patient Details Name: Jay Holloway MRN: [id number] DOB: February 07, 1946 Today's Date: 05/12/2020   History of Present Illness  74 year old male with PMH of DM2, HTN, prostate cancer, recent hospitalization for hypercarbic respiratory failure possibly from undiagnosed COPD/sleep apnea, prior to that also admitted for DKA, recently diagnosed with DVT/PE on apixaban, sent to the ED from SNF where he was found to be lethargic.  recently DC back to [facility] after hodpitalization for acute hypoxic hypercapneic respiratory failure.  Admitted to stepdown unit for acute on chronic hypercarbic and hypoxic respiratory failure with associated acute metabolic encephalopathy.  REview of RLE ATF:TDDUKGUR lower extremity venous Doppler results from 10/2: Had acute DVT involving the right common femoral vein and right posterior tibial vein.  Thrombus in the right common femoral vein was mobile.  Clinical Impression  The patient is alert and able to participate. patient required 3 persons to stand briefly from bed. R knee buckling due to profound isolated weakness of quads. Patient has active ankle D/Pflexion [facility]. Patient also endorses right thigh numbnessand pain. Patient has H/o femoral vein DVT. Patient comes from SNF and plans to return to SNF. Pt admitted with above diagnosis. Pt currently with functional limitations due to the deficits listed below (see PT Problem List). Pt will benefit from skilled PT to increase their independence and safety with mobility to allow discharge to the venue listed below.   Patient's BP stable through out at ~ 106/50's. No c/o dizziness.    Follow Up Recommendations SNF;Supervision/Assistance - 24 hour;Supervision for mobility/OOB    Equipment Recommendations  None recommended by PT    Recommendations for Other Services       Precautions / Restrictions Precautions Precautions: Fall Precaution Comments: R quads poorly activating, will buckle       Mobility  Bed Mobility Overal bed mobility: Needs Assistance Bed Mobility: Supine to Sit Rolling: Mod assist;+2 for physical assistance;+2 for safety/equipment   Supine to sit: Max assist;+2 for physical assistance     General bed mobility comments: patient did initiate moving legs toward bed edge, required more assistance with right leg, Assisted with trunk to sitting upright. Once sitting , patient could use UE's to scoot to bed edge  Transfers Overall transfer level: Needs assistance Equipment used: Rolling walker (2 wheeled) Transfers: Lateral/Scoot Transfers;Sit to/from Stand Sit to Stand: Total assist;+2 physical assistance;+2 safety/equipment;From elevated surface         General transfer comment: with 3 persons, oppatient assisted to stand from raised bed, 1 PY blocking right knee as  it buckles. Decreased pushing into RW. Stood x 2 briefly. Lateral scoot transfer to recliner in multiple scoots, verbal cues.  Ambulation/Gait                Stairs            Wheelchair Mobility    Modified Rankin (Stroke Patients Only)       Balance Overall balance assessment: Independent Sitting-balance support: Bilateral upper extremity supported;Feet supported Sitting balance-Leahy Scale: Fair Sitting balance - Comments: sits near midline withput leaning       Standing balance comment: total assistance at Rw for a few seconds                             Pertinent Vitals/Pain Pain Assessment: Faces Faces Pain Scale: Hurts even more Pain Location: right thigh Pain Descriptors / Indicators: Sore Pain Intervention(s): Monitored during session;Premedicated before session;Limited activity within  patient's tolerance    Home Living Family/patient expects to be discharged to:: Skilled nursing facility                      Prior Function                 Hand Dominance        Extremity/Trunk Assessment        Lower  Extremity Assessment Lower Extremity Assessment: RLE deficits/detail;LLE deficits/detail RLE Deficits / Details: knee ext 1/5, reports decreased sensation on thigh to knee. Can palpate quad tendon activation LLE Deficits / Details: grossly 4- knee ext and hip flexion       Communication      Cognition Arousal/Alertness: Awake/alert Behavior During Therapy: WFL for tasks assessed/performed;Flat affect Overall Cognitive Status: Within Functional Limits for tasks assessed                               Problem Solving: Slow processing;Decreased initiation General Comments: patient alert and able to follow directions      General Comments      Exercises     Assessment/Plan    PT Assessment Patient needs continued PT services  PT Problem List Decreased strength;Decreased activity tolerance;Decreased balance;Decreased knowledge of use of DME;Pain;Decreased mobility       PT Treatment Interventions Functional mobility training;Therapeutic activities;Therapeutic exercise;Patient/family education;Wheelchair mobility training;Neuromuscular re-education    PT Goals (Current goals can be found in the Care Plan section)  Acute Rehab PT Goals Patient Stated Goal: get back to walking PT Goal Formulation: With patient Time For Goal Achievement: 05/26/20 Potential to Achieve Goals: Fair    Frequency Min 2X/week   Barriers to discharge        Co-evaluation               AM-PAC PT "6 Clicks" Mobility  Outcome Measure Help needed turning from your back to your side while in a flat bed without using bedrails?: A Lot Help needed moving from lying on your back to sitting on the side of a flat bed without using bedrails?: A Lot Help needed moving to and from a bed to a chair (including a wheelchair)?: A Lot Help needed standing up from a chair using your arms (e.g., wheelchair or bedside chair)?: Total Help needed to walk in hospital room?: Total Help needed climbing  3-5 steps with a railing? : Total 6 Click Score: 9    End of Session Equipment Utilized During Treatment: Gait belt Activity Tolerance: Patient tolerated treatment well Patient left: in chair;with call bell/phone within reach;with nursing/sitter in room Nurse Communication: Mobility status;Need for lift equipment PT Visit Diagnosis: Muscle weakness (generalized) (M62.81);Difficulty in walking, not elsewhere classified (R26.2)    Time: 5498-2641 PT Time Calculation (min) (ACUTE ONLY): 35 min   Charges:   PT Evaluation $PT Eval Moderate Complexity: [place] Pager [phone] Office [phone]   Claretha Cooper 05/12/2020, 1:23 PM

## 2020-05-13 DIAGNOSIS — J9601 Acute respiratory failure with hypoxia: Secondary | ICD-10-CM | POA: Diagnosis not present

## 2020-05-13 DIAGNOSIS — J9602 Acute respiratory failure with hypercapnia: Secondary | ICD-10-CM | POA: Diagnosis not present

## 2020-05-13 LAB — CBC
HCT: 25.9 % — ABNORMAL LOW (ref 39.0–52.0)
Hemoglobin: 7.8 g/dL — ABNORMAL LOW (ref 13.0–17.0)
MCH: 28.1 pg (ref 26.0–34.0)
MCHC: 30.1 g/dL (ref 30.0–36.0)
MCV: 93.2 fL (ref 80.0–100.0)
Platelets: 441 10*3/uL — ABNORMAL HIGH (ref 150–400)
RBC: 2.78 MIL/uL — ABNORMAL LOW (ref 4.22–5.81)
RDW: 16.6 % — ABNORMAL HIGH (ref 11.5–15.5)
WBC: 8.5 10*3/uL (ref 4.0–10.5)
nRBC: 0 % (ref 0.0–0.2)

## 2020-05-13 LAB — GLUCOSE, CAPILLARY
Glucose-Capillary: 180 mg/dL — ABNORMAL HIGH (ref 70–99)
Glucose-Capillary: 137 mg/dL — ABNORMAL HIGH (ref 70–99)
Glucose-Capillary: 128 mg/dL — ABNORMAL HIGH (ref 70–99)
Glucose-Capillary: 201 mg/dL — ABNORMAL HIGH (ref 70–99)

## 2020-05-13 MED ORDER — IPRATROPIUM-ALBUTEROL 0.5-2.5 (3) MG/3ML IN SOLN
3.0000 mL | Freq: Two times a day (BID) | RESPIRATORY_TRACT | Status: DC
  Administered 2020-05-13 – 2020-05-14 (×2): 3 mL via RESPIRATORY_TRACT
  Filled 2020-05-13 (×2): qty 3

## 2020-05-13 NOTE — Progress Notes (Addendum)
PROGRESS NOTE  Jay Holloway SJG:[id number] DOB: January 15, 1946 DOA: 05/10/2020 PCP: [name], MD  HPI/Recap of past 24 hours:  Jay Holloway is a 74 y.o. male with history of diabetes mellitus type 2, hypertension, prostate cancer who has been recently admitted for hypercarbic respiratory failure possibly from undiagnosed COPD/sleep apnea prior to which patient also was admitted for DKA about few weeks ago and also recently diagnosed with DVT/PE on apixaban was found to be lethargic and was brought to the ER.  Patient had a similar picture of presentation last admission during last week.  No much further history is available.  Did discuss with patient's nephew who said that patient was found to be lethargic and was brought to the ER.  Patient seen and examined at bedside he is doing better he wants to go home.  He is also complaining that the Acapella makes him nauseous so he does not want to use them  Assessment/Plan: Principal Problem:   Acute respiratory failure with hypoxia and hypercapnia (HCC) Active Problems:   Type 2 diabetes mellitus with hyperglycemia, with long-term current use of insulin (HCC)   Prostate cancer (HCC)   Acute metabolic encephalopathy   CKD (chronic kidney disease), stage II   Pressure injury of sacral region, stage 2 (HCC)   Acute respiratory failure ([place])  1.  Acute on chronic respiratory failure with hypoxia and hypercarbia possibly due to COPD exacerbation.  Patient is still requiring oxygen.  He is encouraged to use his Acapella he is on BiPAP at night pulmonary had been consulted  2.  COPD exacerbation continue Solu-Medrol and nebulizer as well as pulmonary toilet  3.  Obstructive sleep apnea.  Pulmonary has recommended to treat as above  4.  Acute kidney injury.  Creatinine is 1.6  today slowly improving  5.  Mild hyperkalemia  6.  Hypotension improved with IV fluid now stable plan  7.  Adult failure to thrive with debility this is secondary to cold  morbidities. PT OT to follow diet regular recommended for SNF  Code Status: Full  Severity of Illness: The appropriate patient status for this patient is INPATIENT. Inpatient status is judged to be reasonable and necessary in order to provide the required intensity of service to ensure the patient's safety. The patient's presenting symptoms, physical exam findings, and initial radiographic and laboratory data in the context of their chronic comorbidities is felt to place them at high risk for further clinical deterioration. Furthermore, it is not anticipated that the patient will be medically stable for discharge from the hospital within 2 midnights of admission. The following factors support the patient status of inpatient.   " The patient's presenting symptoms include respiratory failure " The worrisome physical exam findings include respiratory failure. " The initial radiographic and laboratory data are worrisome because of respiratory failure " The chronic co-morbidities include COPD sleep apnea.   * I certify that at the point of admission it is my clinical judgment that the patient will require inpatient hospital care spanning beyond 2 midnights from the point of admission due to high intensity of service, high risk for further deterioration and high frequency of surveillance required.*    Family Communication: None at bedside  Disposition Plan: Home when stable   Consultants:  None  Procedures:  None  Antimicrobials:  None  DVT prophylaxis: Apixaban   Objective: Vitals:   05/13/20 0358 05/13/20 0400 05/13/20 0500 05/13/20 0811  BP:  116/70 130/67   Pulse:  100 98  Resp:  12 12   Temp: 97.9 F (36.6 C)   (!) 97.3 F (36.3 C)  TempSrc: Oral   Axillary  SpO2:  100%  98%  Weight:      Height:        Intake/Output Summary (Last 24 hours) at 05/13/2020 1014 Last data filed at 05/12/2020 2100 Gross per 24 hour  Intake 800 ml  Output 200 ml  Net 600 ml    Filed Weights   05/10/20 1907 05/10/20 2355  Weight: 96 kg 94.3 kg   Body mass index is 28.2 kg/m.  Exam:  . General: 74 y.o. year-old male well developed well nourished in no acute distress.  Alert and oriented x3.  Chronically ill looking . Cardiovascular: Regular rate and rhythm with no rubs or gallops.  No thyromegaly or JVD noted.   Marland Kitchen Respiratory: Clear to auscultation with no wheezes or rales. Good inspiratory effort. . Abdomen: Soft nontender nondistended with normal bowel sounds x4 quadrants. . Musculoskeletal: No lower extremity edema. 2/4 pulses in all 4 extremities. . Skin: No ulcerative lesions noted or rashes, . Psychiatry: Mood is appropriate for condition and setting    Data Reviewed: CBC: Recent Labs  Lab 05/10/20 1945 05/11/20 0414 05/11/20 0808 05/12/20 0253 05/13/20 0309  WBC 10.8* 10.3  --  11.7* 8.5  NEUTROABS 7.8*  --   --   --   --   HGB 10.0* 9.5* 10.2* 7.8* 7.8*  HCT 35.5* 32.3* 30.0* 25.6* 25.9*  MCV 98.6 97.0  --  93.8 93.2  PLT 530* 508*  --  421* 295*   Basic Metabolic Panel: Recent Labs  Lab 05/10/20 1945 05/11/20 0414 05/11/20 0808 05/12/20 0701  NA 143 144 140 140  K 5.3* 5.1 5.3* 5.4*  CL 98 96*  --  98  CO2 35* 34*  --  33*  GLUCOSE 159* 173*  --  266*  BUN 12 19  --  26*  CREATININE 0.91 0.96  --  1.92*  CALCIUM 8.6* 8.8*  --  7.9*   GFR: Estimated Creatinine Clearance: 40.9 mL/min (A) (by C-G formula based on SCr of 1.92 mg/dL (H)). Liver Function Tests: Recent Labs  Lab 05/10/20 1945 05/11/20 0414  AST 20 15  ALT 21 19  ALKPHOS 104 94  BILITOT 1.1 1.5*  PROT 6.1* 6.0*  ALBUMIN 2.8* 2.6*   No results for input(s): LIPASE, AMYLASE in the last 168 hours. Recent Labs  Lab 05/11/20 0809  AMMONIA 15   Coagulation Profile: No results for input(s): INR, PROTIME in the last 168 hours. Cardiac Enzymes: No results for input(s): CKTOTAL, CKMB, CKMBINDEX, TROPONINI in the last 168 hours. BNP (last 3 results) No  results for input(s): PROBNP in the last 8760 hours. HbA1C: No results for input(s): HGBA1C in the last 72 hours. CBG: Recent Labs  Lab 05/12/20 0757 05/12/20 1204 05/12/20 1634 05/12/20 2137 05/13/20 0811  GLUCAP 247* 231* 224* 233* 180*   Lipid Profile: No results for input(s): CHOL, HDL, LDLCALC, TRIG, CHOLHDL, LDLDIRECT in the last 72 hours. Thyroid Function Tests: No results for input(s): TSH, T4TOTAL, FREET4, T3FREE, THYROIDAB in the last 72 hours. Anemia Panel: No results for input(s): VITAMINB12, FOLATE, FERRITIN, TIBC, IRON, RETICCTPCT in the last 72 hours. Urine analysis:    Component Value Date/Time   COLORURINE AMBER (A) 04/28/2020 2016   APPEARANCEUR CLOUDY (A) 04/28/2020 2016   LABSPEC 1.020 04/28/2020 2016   PHURINE 5.0 04/28/2020 2016   GLUCOSEU NEGATIVE 04/28/2020 2016  [place] NEGATIVE 04/28/2020 2016   [place] NEGATIVE 04/28/2020 2016   KETONESUR NEGATIVE 04/28/2020 2016   PROTEINUR 100 (A) 04/28/2020 2016   UROBILINOGEN 0.2 11/05/2014 1712   NITRITE NEGATIVE 04/28/2020 2016   LEUKOCYTESUR LARGE (A) 04/28/2020 2016   Sepsis Labs: @LABRCNTIP (procalcitonin:4,lacticidven:4)  ) Recent Results (from the past 240 hour(s))  SARS Coronavirus 2 by RT PCR (hospital order, performed in [facility] hospital lab) Nasopharyngeal Nasopharyngeal Swab     Status: None   Collection Time: 05/04/20  2:32 PM   Specimen: Nasopharyngeal Swab  Result Value Ref Range Status   SARS Coronavirus 2 NEGATIVE NEGATIVE Final    Comment: (NOTE) SARS-CoV-2 target nucleic acids are NOT DETECTED.  The SARS-CoV-2 RNA is generally detectable in upper and lower respiratory specimens during the acute phase of infection. The lowest concentration of SARS-CoV-2 viral copies this assay can detect is 250 copies / mL. A negative result does not preclude SARS-CoV-2 infection and should not be used as the sole basis for treatment or other patient management decisions.  A negative result  may occur with improper specimen collection / handling, submission of specimen other than nasopharyngeal swab, presence of viral mutation(s) within the areas targeted by this assay, and inadequate number of viral copies (<250 copies / mL). A negative result must be combined with clinical observations, patient history, and epidemiological information.  Fact Sheet for Patients:   StrictlyIdeas.no  Fact Sheet for Healthcare Providers: BankingDealers.co.za  This test is not yet approved or  cleared by the [place] FDA and has been authorized for detection and/or diagnosis of SARS-CoV-2 by FDA under an Emergency Use Authorization (EUA).  This EUA will remain in effect (meaning this test can be used) for the duration of the COVID-19 declaration under Section 564(b)(1) of the Act, 21 U.S.C. section 360bbb-3(b)(1), unless the authorization is terminated or revoked sooner.  Performed at [facility], [place] [address] [place], [place] [address]   Respiratory Panel by RT PCR (Flu A&B, Covid) - Nasopharyngeal Swab     Status: None   Collection Time: 05/10/20  7:58 PM   Specimen: Nasopharyngeal Swab  Result Value Ref Range Status   SARS Coronavirus 2 by RT PCR NEGATIVE NEGATIVE Final    Comment: (NOTE) SARS-CoV-2 target nucleic acids are NOT DETECTED.  The SARS-CoV-2 RNA is generally detectable in upper respiratoy specimens during the acute phase of infection. The lowest concentration of SARS-CoV-2 viral copies this assay can detect is 131 copies/mL. A negative result does not preclude SARS-Cov-2 infection and should not be used as the sole basis for treatment or other patient management decisions. A negative result may occur with  improper specimen collection/handling, submission of specimen other than nasopharyngeal swab, presence of viral mutation(s) within the areas targeted by this assay, and inadequate number of  viral copies (<131 copies/mL). A negative result must be combined with clinical observations, patient history, and epidemiological information. The expected result is Negative.  Fact Sheet for Patients:  PinkCheek.be  Fact Sheet for Healthcare Providers:  GravelBags.it  This test is no t yet approved or cleared by the [place] FDA and  has been authorized for detection and/or diagnosis of SARS-CoV-2 by FDA under an Emergency Use Authorization (EUA). This EUA will remain  in effect (meaning this test can be used) for the duration of the COVID-19 declaration under Section 564(b)(1) of the Act, 21 U.S.C. section 360bbb-3(b)(1), unless the authorization is terminated or revoked sooner.     Influenza A by PCR  NEGATIVE NEGATIVE Final   Influenza B by PCR NEGATIVE NEGATIVE Final    Comment: (NOTE) The Xpert Xpress SARS-CoV-2/FLU/RSV assay is intended as an aid in  the diagnosis of influenza from Nasopharyngeal swab specimens and  should not be used as a sole basis for treatment. Nasal washings and  aspirates are unacceptable for Xpert Xpress SARS-CoV-2/FLU/RSV  testing.  Fact Sheet for Patients: PinkCheek.be  Fact Sheet for Healthcare Providers: GravelBags.it  This test is not yet approved or cleared by the [place] FDA and  has been authorized for detection and/or diagnosis of SARS-CoV-2 by  FDA under an Emergency Use Authorization (EUA). This EUA will remain  in effect (meaning this test can be used) for the duration of the  Covid-19 declaration under Section 564(b)(1) of the Act, 21  U.S.C. section 360bbb-3(b)(1), unless the authorization is  terminated or revoked. Performed at [facility], [place] [address] [place], [place] [address]   MRSA PCR Screening     Status: None   Collection Time: 05/11/20 12:04 AM   Specimen: Nasal Mucosa;  Nasopharyngeal  Result Value Ref Range Status   MRSA by PCR NEGATIVE NEGATIVE Final    Comment:        The GeneXpert MRSA Assay (FDA approved for NASAL specimens only), is one component of a comprehensive MRSA colonization surveillance program. It is not intended to diagnose MRSA infection nor to guide or monitor treatment for MRSA infections. Performed at [facility], [place] [address] [place], [place] [address]       Studies: No results found.  Scheduled Meds: . apixaban  5 mg Oral BID  . atorvastatin  10 mg Oral Daily  . Chlorhexidine Gluconate Cloth  6 each Topical Daily  . cholecalciferol  1,000 Units Oral Daily  . feeding supplement  237 mL Oral BID BM  . furosemide  20 mg Oral Daily  . insulin aspart  0-5 Units Subcutaneous QHS  . insulin aspart  0-9 Units Subcutaneous TID WC  . insulin glargine  10 Units Subcutaneous Daily  . ipratropium-albuterol  3 mL Nebulization BID  . mouth rinse  15 mL Mouth Rinse BID  . multivitamin  15 mL Oral Daily  . pantoprazole  40 mg Oral BID  . predniSONE  40 mg Oral Q breakfast    Continuous Infusions:   LOS: 3 days     Cristal Deer, MD [facility]  To reach me or the doctor on call, go to: [email or web address] Password [email or web address]  05/13/2020, 10:14 AM

## 2020-05-13 NOTE — Progress Notes (Signed)
Approached by Pt RN and informed that the Pt had removed his BiPAP mask and was refusing to put it back on.  RN placed Pt back on his nasal cannula.

## 2020-05-13 NOTE — Progress Notes (Signed)
Inpatient Diabetes Program Recommendations  AACE/ADA: New Consensus Statement on Inpatient Glycemic Control (2015)  Target Ranges:  Prepandial:   less than 140 mg/dL      Peak postprandial:   less than 180 mg/dL (1-2 hours)      Critically ill patients:  140 - 180 mg/dL   Lab Results  Component Value Date   GLUCAP 180 (H) 05/13/2020   HGBA1C 7.8 (H) 05/03/2020    Review of Glycemic Control Results for CALE, BETHARD (MRN [id number]) as of 05/13/2020 08:17  Ref. Range 05/11/2020 23:37 05/12/2020 07:57 05/12/2020 12:04 05/12/2020 16:34 05/12/2020 21:37 05/13/2020 08:11  Glucose-Capillary Latest Ref Range: 70 - 99 mg/dL 189 (H) 247 (H) 231 (H) 224 (H) 233 (H) 180 (H)   Diabetes history: DM 2 Outpatient Diabetes medications: Lantus 10 units daily, Metformin 500 mg q PM Current orders for Inpatient glycemic control:  Lantus 10 units daily, Prednisone 40 mg daily Novolog sensitive tid with meals and HS Inpatient Diabetes Program Recommendations:    Agree with current orders.  No further recommendations.   Thanks,  Adah Perl, RN, BC-ADM Inpatient Diabetes Coordinator Pager [phone] (8a-5p)

## 2020-05-14 DIAGNOSIS — J9602 Acute respiratory failure with hypercapnia: Secondary | ICD-10-CM | POA: Diagnosis not present

## 2020-05-14 DIAGNOSIS — J9601 Acute respiratory failure with hypoxia: Secondary | ICD-10-CM | POA: Diagnosis not present

## 2020-05-14 LAB — GLUCOSE, CAPILLARY
Glucose-Capillary: 178 mg/dL — ABNORMAL HIGH (ref 70–99)
Glucose-Capillary: 195 mg/dL — ABNORMAL HIGH (ref 70–99)
Glucose-Capillary: 243 mg/dL — ABNORMAL HIGH (ref 70–99)
Glucose-Capillary: 274 mg/dL — ABNORMAL HIGH (ref 70–99)

## 2020-05-14 LAB — CREATININE, SERUM
Creatinine, Ser: 1.6 mg/dL — ABNORMAL HIGH (ref 0.61–1.24)
GFR, Estimated: 42 mL/min — ABNORMAL LOW (ref >60)

## 2020-05-14 LAB — CBC
HCT: 29.9 % — ABNORMAL LOW (ref 39.0–52.0)
Hemoglobin: 9.1 g/dL — ABNORMAL LOW (ref 13.0–17.0)
MCH: 28.5 pg (ref 26.0–34.0)
MCHC: 30.4 g/dL (ref 30.0–36.0)
MCV: 93.7 fL (ref 80.0–100.0)
Platelets: 443 10*3/uL — ABNORMAL HIGH (ref 150–400)
RBC: 3.19 MIL/uL — ABNORMAL LOW (ref 4.22–5.81)
RDW: 16.2 % — ABNORMAL HIGH (ref 11.5–15.5)
WBC: 8.8 10*3/uL (ref 4.0–10.5)
nRBC: 0 % (ref 0.0–0.2)

## 2020-05-14 NOTE — Progress Notes (Signed)
ANTICOAGULATION CONSULT NOTE - Follow Up Consult  Pharmacy Consult for Eliquis Indication:hx recent pulmonary embolus and DVT  No Known Allergies  Patient Measurements: Height: 6' (182.9 cm) Weight: 94.3 kg (207 lb 14.3 oz) IBW/kg (Calculated) : 77.6 Heparin Dosing Weight:   Vital Signs: Temp: 98 F (36.7 C) (10/17 0400) Temp Source: Oral (10/17 0400) BP: 153/87 (10/17 0800) Pulse Rate: 90 (10/17 0800)  Labs: Recent Labs    05/12/20 0253 05/12/20 0253 05/12/20 0701 05/13/20 0309 05/14/20 0310  HGB 7.8*   < >  --  7.8* 9.1*  HCT 25.6*  --   --  25.9* 29.9*  PLT 421*  --   --  441* 443*  CREATININE  --   --  1.92*  --   --    < > = values in this interval not displayed.    Estimated Creatinine Clearance: 40.9 mL/min (A) (by C-G formula based on SCr of 1.92 mg/dL (H)).   Medications:  - on Eliquis dose pack PTA   Assessment: Patient is a 74 y.o M recently diagnosed with DVT/PE on 10/3 on Eliquis PTA, presented to the ED on 10/13 with AMS.  She was transitioned to heparin drip on admission and back to Eliquis on 10/14.  Today, 05/14/2020: - hgb up 9.1, plts high - no bleeding documented - scr down 1.60 (crcl~49)   Plan:  - continue eliquis 5mg  bid - pharmacy will sign off for Eliquis, but will follow patient peripherally along with you  Jay Holloway P 05/14/2020,9:09 AM

## 2020-05-14 NOTE — Progress Notes (Signed)
PROGRESS NOTE  Jay Holloway GQQ:[id number] DOB: 11-29-1945 DOA: 05/10/2020 PCP: [name], MD  HPI/Recap of past 24 hours:  Jay Holloway is a 73 y.o. male with history of diabetes mellitus type 2, hypertension, prostate cancer who has been recently admitted for hypercarbic respiratory failure possibly from undiagnosed COPD/sleep apnea prior to which patient also was admitted for DKA about few weeks ago and also recently diagnosed with DVT/PE on apixaban was found to be lethargic and was brought to the ER.  Patient had a similar picture of presentation last admission during last week.  No much further history is available.  Did discuss with patient's nephew who said that patient was found to be lethargic and was brought to the ER.  May 13, 2020: Patient seen and examined at bedside he is doing better he wants to go home.  He is also complaining that the Acapella makes him nauseous so he does not want to use them.  May 14, 2020: Patient seen and examined at bedside he said that he still feeling weak.  Nurse reported no untoward event overnight.   Assessment/Plan: Principal Problem:   Acute respiratory failure with hypoxia and hypercapnia (HCC) Active Problems:   Type 2 diabetes mellitus with hyperglycemia, with long-term current use of insulin (HCC)   Prostate cancer (HCC)   Acute metabolic encephalopathy   CKD (chronic kidney disease), stage II   Pressure injury of sacral region, stage 2 (HCC)   Acute respiratory failure ([place])  1.  Acute on chronic respiratory failure with hypoxia and hypercarbia possibly due to COPD exacerbation.  Patient is still requiring oxygen.  He is encouraged to use his Acapella he is on BiPAP at night pulmonary had been consulted  2.  COPD exacerbation continue Solu-Medrol and nebulizer as well as pulmonary toilet  3.  Obstructive sleep apnea.  Pulmonary has recommended to treat as above  4.  Acute kidney injury.  Creatinine was 1.6, slowly  improving we will recheck and monitor closely  5.  Mild hyperkalemia  6.  Hypotension improved with IV fluid now stable plan  7.  Adult failure to thrive with debility this is secondary to cold morbidities. PT OT to follow diet regular recommended for SNF Code Status: Full  Severity of Illness: The appropriate patient status for this patient is INPATIENT. Inpatient status is judged to be reasonable and necessary in order to provide the required intensity of service to ensure the patient's safety. The patient's presenting symptoms, physical exam findings, and initial radiographic and laboratory data in the context of their chronic comorbidities is felt to place them at high risk for further clinical deterioration. Furthermore, it is not anticipated that the patient will be medically stable for discharge from the hospital within 2 midnights of admission. The following factors support the patient status of inpatient.   " The patient's presenting symptoms include respiratory failure " The worrisome physical exam findings include respiratory failure. " The initial radiographic and laboratory data are worrisome because of respiratory failure " The chronic co-morbidities include COPD sleep apnea.   * I certify that at the point of admission it is my clinical judgment that the patient will require inpatient hospital care spanning beyond 2 midnights from the point of admission due to high intensity of service, high risk for further deterioration and high frequency of surveillance required.*    Family Communication: None at bedside  Disposition Plan: Transfer to progressive.  Home when stable   Consultants:  None  Procedures:  None  Antimicrobials:  None  DVT prophylaxis: Apixaban   Objective: Vitals:   05/14/20 0800 05/14/20 1022 05/14/20 1200 05/14/20 1312  BP: (!) 153/87  (!) 150/85   Pulse: 90  96   Resp: (!) 9  10   Temp:   97.6 F (36.4 C) 97.6 F (36.4 C)  TempSrc:    Oral   SpO2: 100% 100% 100%   Weight:      Height:        Intake/Output Summary (Last 24 hours) at 05/14/2020 1428 Last data filed at 05/14/2020 0600 Gross per 24 hour  Intake 500 ml  Output 1250 ml  Net -750 ml   Filed Weights   05/10/20 1907 05/10/20 2355  Weight: 96 kg 94.3 kg   Body mass index is 28.2 kg/m.  Exam:  . General: 74 y.o. year-old male well developed well nourished in no acute distress.  Alert and oriented x3.  Chronically ill looking . Cardiovascular: Regular rate and rhythm with no rubs or gallops.  No thyromegaly or JVD noted.   Marland Kitchen Respiratory: Clear to auscultation with no wheezes or rales. Good inspiratory effort. . Abdomen: Soft nontender nondistended with normal bowel sounds x4 quadrants. . Musculoskeletal: No lower extremity edema. 2/4 pulses in all 4 extremities. . Skin: No ulcerative lesions noted or rashes, . Psychiatry: Mood is appropriate for condition and setting    Data Reviewed: CBC: Recent Labs  Lab 05/10/20 1945 05/10/20 1945 05/11/20 0414 05/11/20 0808 05/12/20 0253 05/13/20 0309 05/14/20 0310  WBC 10.8*  --  10.3  --  11.7* 8.5 8.8  NEUTROABS 7.8*  --   --   --   --   --   --   HGB 10.0*   < > 9.5* 10.2* 7.8* 7.8* 9.1*  HCT 35.5*   < > 32.3* 30.0* 25.6* 25.9* 29.9*  MCV 98.6  --  97.0  --  93.8 93.2 93.7  PLT 530*  --  508*  --  421* 441* 443*   < > = values in this interval not displayed.   Basic Metabolic Panel: Recent Labs  Lab 05/10/20 1945 05/11/20 0414 05/11/20 0808 05/12/20 0701 05/14/20 0310  NA 143 144 140 140  --   K 5.3* 5.1 5.3* 5.4*  --   CL 98 96*  --  98  --   CO2 35* 34*  --  33*  --   GLUCOSE 159* 173*  --  266*  --   BUN 12 19  --  26*  --   CREATININE 0.91 0.96  --  1.92* 1.60*  CALCIUM 8.6* 8.8*  --  7.9*  --    GFR: Estimated Creatinine Clearance: 49 mL/min (A) (by C-G formula based on SCr of 1.6 mg/dL (H)). Liver Function Tests: Recent Labs  Lab 05/10/20 1945 05/11/20 0414  AST 20 15   ALT 21 19  ALKPHOS 104 94  BILITOT 1.1 1.5*  PROT 6.1* 6.0*  ALBUMIN 2.8* 2.6*   No results for input(s): LIPASE, AMYLASE in the last 168 hours. Recent Labs  Lab 05/11/20 0809  AMMONIA 15   Coagulation Profile: No results for input(s): INR, PROTIME in the last 168 hours. Cardiac Enzymes: No results for input(s): CKTOTAL, CKMB, CKMBINDEX, TROPONINI in the last 168 hours. BNP (last 3 results) No results for input(s): PROBNP in the last 8760 hours. HbA1C: No results for input(s): HGBA1C in the last 72 hours. CBG: Recent Labs  Lab 05/13/20 1207 05/13/20 1638 05/13/20 2135 05/14/20  6568 05/14/20 1219  GLUCAP 137* 128* 201* 178* 195*   Lipid Profile: No results for input(s): CHOL, HDL, LDLCALC, TRIG, CHOLHDL, LDLDIRECT in the last 72 hours. Thyroid Function Tests: No results for input(s): TSH, T4TOTAL, FREET4, T3FREE, THYROIDAB in the last 72 hours. Anemia Panel: No results for input(s): VITAMINB12, FOLATE, FERRITIN, TIBC, IRON, RETICCTPCT in the last 72 hours. Urine analysis:    Component Value Date/Time   COLORURINE AMBER (A) 04/28/2020 2016   APPEARANCEUR CLOUDY (A) 04/28/2020 2016   LABSPEC 1.020 04/28/2020 2016   PHURINE 5.0 04/28/2020 2016   GLUCOSEU NEGATIVE 04/28/2020 2016   HGBUR NEGATIVE 04/28/2020 2016   [place] NEGATIVE 04/28/2020 2016   KETONESUR NEGATIVE 04/28/2020 2016   PROTEINUR 100 (A) 04/28/2020 2016   UROBILINOGEN 0.2 11/05/2014 1712   NITRITE NEGATIVE 04/28/2020 2016   LEUKOCYTESUR LARGE (A) 04/28/2020 2016   Sepsis Labs: @LABRCNTIP (procalcitonin:4,lacticidven:4)  ) Recent Results (from the past 240 hour(s))  SARS Coronavirus 2 by RT PCR (hospital order, performed in [place] hospital lab) Nasopharyngeal Nasopharyngeal Swab     Status: None   Collection Time: 05/04/20  2:32 PM   Specimen: Nasopharyngeal Swab  Result Value Ref Range Status   SARS Coronavirus 2 NEGATIVE NEGATIVE Final    Comment: (NOTE) SARS-CoV-2 target nucleic  acids are NOT DETECTED.  The SARS-CoV-2 RNA is generally detectable in upper and lower respiratory specimens during the acute phase of infection. The lowest concentration of SARS-CoV-2 viral copies this assay can detect is 250 copies / mL. A negative result does not preclude SARS-CoV-2 infection and should not be used as the sole basis for treatment or other patient management decisions.  A negative result may occur with improper specimen collection / handling, submission of specimen other than nasopharyngeal swab, presence of viral mutation(s) within the areas targeted by this assay, and inadequate number of viral copies (<250 copies / mL). A negative result must be combined with clinical observations, patient history, and epidemiological information.  Fact Sheet for Patients:   StrictlyIdeas.no  Fact Sheet for Healthcare Providers: BankingDealers.co.za  This test is not yet approved or  cleared by the [place] FDA and has been authorized for detection and/or diagnosis of SARS-CoV-2 by FDA under an Emergency Use Authorization (EUA).  This EUA will remain in effect (meaning this test can be used) for the duration of the COVID-19 declaration under Section 564(b)(1) of the Act, 21 U.S.C. section 360bbb-3(b)(1), unless the authorization is terminated or revoked sooner.  Performed at [facility], [place] [address] [place], [place] [address]   Respiratory Panel by RT PCR (Flu A&B, Covid) - Nasopharyngeal Swab     Status: None   Collection Time: 05/10/20  7:58 PM   Specimen: Nasopharyngeal Swab  Result Value Ref Range Status   SARS Coronavirus 2 by RT PCR NEGATIVE NEGATIVE Final    Comment: (NOTE) SARS-CoV-2 target nucleic acids are NOT DETECTED.  The SARS-CoV-2 RNA is generally detectable in upper respiratoy specimens during the acute phase of infection. The lowest concentration of SARS-CoV-2 viral copies this  assay can detect is 131 copies/mL. A negative result does not preclude SARS-Cov-2 infection and should not be used as the sole basis for treatment or other patient management decisions. A negative result may occur with  improper specimen collection/handling, submission of specimen other than nasopharyngeal swab, presence of viral mutation(s) within the areas targeted by this assay, and inadequate number of viral copies (<131 copies/mL). A negative result must be combined with clinical observations,  patient history, and epidemiological information. The expected result is Negative.  Fact Sheet for Patients:  PinkCheek.be  Fact Sheet for Healthcare Providers:  GravelBags.it  This test is no t yet approved or cleared by the [place] FDA and  has been authorized for detection and/or diagnosis of SARS-CoV-2 by FDA under an Emergency Use Authorization (EUA). This EUA will remain  in effect (meaning this test can be used) for the duration of the COVID-19 declaration under Section 564(b)(1) of the Act, 21 U.S.C. section 360bbb-3(b)(1), unless the authorization is terminated or revoked sooner.     Influenza A by PCR NEGATIVE NEGATIVE Final   Influenza B by PCR NEGATIVE NEGATIVE Final    Comment: (NOTE) The Xpert Xpress SARS-CoV-2/FLU/RSV assay is intended as an aid in  the diagnosis of influenza from Nasopharyngeal swab specimens and  should not be used as a sole basis for treatment. Nasal washings and  aspirates are unacceptable for Xpert Xpress SARS-CoV-2/FLU/RSV  testing.  Fact Sheet for Patients: PinkCheek.be  Fact Sheet for Healthcare Providers: GravelBags.it  This test is not yet approved or cleared by the [place] FDA and  has been authorized for detection and/or diagnosis of SARS-CoV-2 by  FDA under an Emergency Use Authorization (EUA). This EUA will  remain  in effect (meaning this test can be used) for the duration of the  Covid-19 declaration under Section 564(b)(1) of the Act, 21  U.S.C. section 360bbb-3(b)(1), unless the authorization is  terminated or revoked. Performed at [facility], [place] [address], [place], [place] [address]   MRSA PCR Screening     Status: None   Collection Time: 05/11/20 12:04 AM   Specimen: Nasal Mucosa; Nasopharyngeal  Result Value Ref Range Status   MRSA by PCR NEGATIVE NEGATIVE Final    Comment:        The GeneXpert MRSA Assay (FDA approved for NASAL specimens only), is one component of a comprehensive MRSA colonization surveillance program. It is not intended to diagnose MRSA infection nor to guide or monitor treatment for MRSA infections. Performed at [facility], [place] [address], [place], [place] [address]       Studies: No results found.  Scheduled Meds: . apixaban  5 mg Oral BID  . atorvastatin  10 mg Oral Daily  . Chlorhexidine Gluconate Cloth  6 each Topical Daily  . cholecalciferol  1,000 Units Oral Daily  . feeding supplement  237 mL Oral BID BM  . furosemide  20 mg Oral Daily  . insulin aspart  0-5 Units Subcutaneous QHS  . insulin aspart  0-9 Units Subcutaneous TID WC  . insulin glargine  10 Units Subcutaneous Daily  . ipratropium-albuterol  3 mL Nebulization BID  . mouth rinse  15 mL Mouth Rinse BID  . multivitamin  15 mL Oral Daily  . pantoprazole  40 mg Oral BID  . predniSONE  40 mg Oral Q breakfast    Continuous Infusions:   LOS: 4 days     Cristal Deer, MD [facility]  To reach me or the doctor on call, go to: [email or web address] Password [email or web address]  05/14/2020, 2:28 PM

## 2020-05-15 ENCOUNTER — Inpatient Hospital Stay (HOSPITAL_COMMUNITY): Payer: Medicare HMO

## 2020-05-15 DIAGNOSIS — J9601 Acute respiratory failure with hypoxia: Secondary | ICD-10-CM | POA: Diagnosis not present

## 2020-05-15 DIAGNOSIS — J9602 Acute respiratory failure with hypercapnia: Secondary | ICD-10-CM | POA: Diagnosis not present

## 2020-05-15 LAB — CBC
HCT: 26.6 % — ABNORMAL LOW (ref 39.0–52.0)
Hemoglobin: 8.5 g/dL — ABNORMAL LOW (ref 13.0–17.0)
MCH: 29 pg (ref 26.0–34.0)
MCHC: 32 g/dL (ref 30.0–36.0)
MCV: 90.8 fL (ref 80.0–100.0)
Platelets: 420 10*3/uL — ABNORMAL HIGH (ref 150–400)
RBC: 2.93 MIL/uL — ABNORMAL LOW (ref 4.22–5.81)
RDW: 15.8 % — ABNORMAL HIGH (ref 11.5–15.5)
WBC: 7 10*3/uL (ref 4.0–10.5)
nRBC: 0 % (ref 0.0–0.2)

## 2020-05-15 LAB — GLUCOSE, CAPILLARY
Glucose-Capillary: 160 mg/dL — ABNORMAL HIGH (ref 70–99)
Glucose-Capillary: 183 mg/dL — ABNORMAL HIGH (ref 70–99)
Glucose-Capillary: 156 mg/dL — ABNORMAL HIGH (ref 70–99)
Glucose-Capillary: 271 mg/dL — ABNORMAL HIGH (ref 70–99)
Glucose-Capillary: 283 mg/dL — ABNORMAL HIGH (ref 70–99)

## 2020-05-15 LAB — COMPREHENSIVE METABOLIC PANEL
ALT: 32 U/L (ref 0–44)
AST: 27 U/L (ref 15–41)
Albumin: 3 g/dL — ABNORMAL LOW (ref 3.5–5.0)
Alkaline Phosphatase: 93 U/L (ref 38–126)
Anion gap: 10 (ref 5–15)
BUN: 26 mg/dL — ABNORMAL HIGH (ref 8–23)
CO2: 38 mmol/L — ABNORMAL HIGH (ref 22–32)
Calcium: 9 mg/dL (ref 8.9–10.3)
Chloride: 89 mmol/L — ABNORMAL LOW (ref 98–111)
Creatinine, Ser: 1.34 mg/dL — ABNORMAL HIGH (ref 0.61–1.24)
GFR, Estimated: 52 mL/min — ABNORMAL LOW (ref >60)
Glucose, Bld: 246 mg/dL — ABNORMAL HIGH (ref 70–99)
Potassium: 4.4 mmol/L (ref 3.5–5.1)
Sodium: 137 mmol/L (ref 135–145)
Total Bilirubin: 1 mg/dL (ref 0.3–1.2)
Total Protein: 6.2 g/dL — ABNORMAL LOW (ref 6.5–8.1)

## 2020-05-15 LAB — RESPIRATORY PANEL BY RT PCR (FLU A&B, COVID)
Influenza A by PCR: NEGATIVE
Influenza B by PCR: NEGATIVE
SARS Coronavirus 2 by RT PCR: NEGATIVE

## 2020-05-15 LAB — TSH: TSH: 2.825 u[IU]/mL (ref 0.350–4.500)

## 2020-05-15 MED ORDER — LEVALBUTEROL HCL 1.25 MG/0.5ML IN NEBU: 1.2500 mg | INHALATION_SOLUTION | Freq: Four times a day (QID) | RESPIRATORY_TRACT | Status: DC | PRN

## 2020-05-15 MED ORDER — DILTIAZEM HCL 60 MG PO TABS
60.0000 mg | ORAL_TABLET | Freq: Three times a day (TID) | ORAL | Status: DC
  Administered 2020-05-15 – 2020-05-18 (×11): 60 mg via ORAL
  Filled 2020-05-15 (×11): qty 1

## 2020-05-15 MED ORDER — HYDRALAZINE HCL 25 MG PO TABS: 25.0000 mg | ORAL_TABLET | Freq: Four times a day (QID) | ORAL | Status: DC | PRN

## 2020-05-15 NOTE — NC FL2 (Signed)
[place] MEDICAID FL2 LEVEL OF CARE SCREENING TOOL     IDENTIFICATION  Patient Name: Jay Holloway Birthdate: 1945-12-29 Sex: male Admission Date (Current Location): 05/10/2020  [facility] and [place] Number:  Herbalist and Address:  [facility],  [place] [address] [place]      Provider Number: [id number]  Attending Physician Name and Address:  Barb Merino, MD  Relative Name and Phone Number:  Holley Bouche    Current Level of Care: Hospital Recommended Level of Care: [place] Prior Approval Number:    Date Approved/Denied:   PASRR Number: [id number]  Discharge Plan: SNF    Current Diagnoses: Patient Active Problem List   Diagnosis Date Noted  . Acute respiratory failure with hypoxia and hypercapnia ([place]) 05/10/2020  . Acute respiratory failure ([place]) 05/10/2020  . Hypomagnesemia 04/28/2020  . Mixed diabetic hyperlipidemia associated with type 2 diabetes mellitus ([place]) 04/28/2020  . Pressure injury of sacral region, stage 2 ([place]) 04/19/2020  . Acute diastolic CHF (congestive heart failure) ([place]) 04/19/2020  . Acute respiratory failure with hypoxia and hypercarbia ([place]) 04/17/2020  . Hypernatremia 04/17/2020  . Dysphagia 04/17/2020  . Acute metabolic encephalopathy 67/06/4579  . CKD (chronic kidney disease), stage II 04/17/2020  . Hyperkalemia 04/14/2020  . GERD without esophagitis 04/14/2020  . Slurred speech 04/14/2020  . Generalized weakness 04/14/2020  . AKI (acute kidney injury) ([place]) 03/21/2020  . Prostate cancer ([place])   . Essential hypertension   . Arterial hypotension   . Renal failure   . Type 2 diabetes mellitus with hyperglycemia, with long-term current use of insulin ([place])   . Weakness   . DKA (diabetic ketoacidoses) 11/05/2014    Orientation RESPIRATION BLADDER Height & Weight     Self, Time, Situation, Place  Normal Incontinent Weight: 94.3 kg Height:  6' (182.9 cm)  BEHAVIORAL SYMPTOMS/MOOD  NEUROLOGICAL BOWEL NUTRITION STATUS      Continent Diet (regular)  AMBULATORY STATUS COMMUNICATION OF NEEDS Skin   Extensive Assist Verbally Normal                       Personal Care Assistance Level of Assistance  Bathing, Feeding, Dressing Bathing Assistance: Limited assistance Feeding assistance: Limited assistance Dressing Assistance: Limited assistance     Functional Limitations Info  Sight Sight Info: Impaired Hearing Info: Adequate Speech Info: Impaired    SPECIAL CARE FACTORS FREQUENCY  PT (By licensed PT)     PT Frequency: 5 x weekly OT Frequency: 5 x weekly            Contractures Contractures Info: Not present    Additional Factors Info  Code Status Code Status Info: DNR Allergies Info: NKA           Current Medications (05/15/2020):  This is the current hospital active medication list Current Facility-Administered Medications  Medication Dose Route Frequency Provider Last Rate Last Admin  . albuterol (PROVENTIL) (2.5 MG/3ML) 0.083% nebulizer solution 2.5 mg  2.5 mg Nebulization Q4H PRN Hongalgi, Anand D, MD      . apixaban (ELIQUIS) tablet 5 mg  5 mg Oral BID Modena Jansky, MD   5 mg at 05/14/20 2225  . atorvastatin (LIPITOR) tablet 10 mg  10 mg Oral Daily Modena Jansky, MD   10 mg at 05/14/20 0929  . Chlorhexidine Gluconate Cloth 2 % PADS 6 each  6 each Topical Daily Rise Patience, MD   6 each at 05/14/20 1200  .  cholecalciferol (VITAMIN D) tablet 1,000 Units  1,000 Units Oral Daily Modena Jansky, MD   1,000 Units at 05/14/20 0929  . feeding supplement (ENSURE ENLIVE / ENSURE PLUS) liquid 237 mL  237 mL Oral BID BM Hongalgi, Anand D, MD   237 mL at 05/13/20 1031  . furosemide (LASIX) tablet 20 mg  20 mg Oral Daily Vernell Leep D, MD   20 mg at 05/14/20 6773  . hydrALAZINE (APRESOLINE) tablet 25 mg  25 mg Oral Q6H PRN Barb Merino, MD      . insulin aspart (novoLOG) injection 0-5 Units  0-5 Units Subcutaneous QHS Modena Jansky, MD   3 Units at 05/14/20 2227  . insulin aspart (novoLOG) injection 0-9 Units  0-9 Units Subcutaneous TID WC Modena Jansky, MD   2 Units at 05/15/20 0930  . insulin glargine (LANTUS) injection 10 Units  10 Units Subcutaneous Daily Modena Jansky, MD   10 Units at 05/14/20 [phone]  . MEDLINE mouth rinse  15 mL Mouth Rinse BID Rise Patience, MD   15 mL at 05/14/20 2229  . multivitamin liquid 15 mL  15 mL Oral Daily Modena Jansky, MD   15 mL at 05/14/20 0929  . pantoprazole (PROTONIX) EC tablet 40 mg  40 mg Oral BID Modena Jansky, MD   40 mg at 05/14/20 2225  . Resource ThickenUp Clear   Oral PRN Modena Jansky, MD      . traMADol Veatrice Bourbon) tablet 50 mg  50 mg Oral Q6H PRN Modena Jansky, MD   50 mg at 05/15/20 8159     Discharge Medications: Please see discharge summary for a list of discharge medications.  Relevant Imaging Results:  Relevant Lab Results:   Additional Information [id number]  Leeroy Cha, RN

## 2020-05-15 NOTE — Progress Notes (Addendum)
Physical Therapy Treatment Patient Details Name: Jay Holloway MRN: [id number] DOB: 1946-07-18 Today's Date: 05/15/2020    History of Present Illness 74 year old male with PMH of DM2, HTN, prostate cancer, recent hospitalization for hypercarbic respiratory failure possibly from undiagnosed COPD/sleep apnea, prior to that also admitted for DKA, recently diagnosed with DVT/PE on apixaban, sent to the ED from SNF where he was found to be lethargic.  recently DC back to [facility] after hodpitalization for acute hypoxic hypercapneic respiratory failure.  Admitted to stepdown unit for acute on chronic hypercarbic and hypoxic respiratory failure with associated acute metabolic encephalopathy.  REview of RLE RAQ:[id number] lower extremity venous Doppler results from 10/2: Had acute DVT involving the right common femoral vein and right posterior tibial vein.  Thrombus in the right common femoral vein was mobile.    PT Comments    The patient appears more confused, hallucinating today. Patient sitting on bed edge. Nursing had stood with extensive assistance  To use Bedpan. Patient assisted with 2 max assist to scoot laterally to recliner with drop arm feature. Patient's HR 120-148 with activity.  The patient continues to exhibit profound weakness of the right quad. High risk to buckle when attempting to stand. Currently recommend  Either  scoot transfers or mechanical lift. In  questioning patient(Unsure if reliable), patient reports that when he slid off of his bed at home, his right leg folded up under him, ? Could be a stretch injury but really know way to  determine cause of quad weakness. Patient is not a good historian, seems less oriented and less atuned in to activity today.    Follow Up Recommendations  SNF;Supervision/Assistance - 24 hour;Supervision for mobility/OOB     Equipment Recommendations  None recommended by PT    Recommendations for Other Services       Precautions /  Restrictions Precautions Precaution Comments: R quads poorly activating, will buckle quickly, needs stedy or maxi lift    Mobility  Bed Mobility               General bed mobility comments: sitting on bed edge with 4  nursing staff  Transfers     Transfers: Lateral/Scoot Transfers          Lateral/Scoot Transfers: Max assist General transfer comment: 3 persons and use of bed pad to scoot to the right into dropped arm recliner. Patient requires multimodal cues.  Ambulation/Gait             General Gait Details: unable   Stairs             Wheelchair Mobility    Modified Rankin (Stroke Patients Only)       Balance Overall balance assessment: Needs assistance Sitting-balance support: Feet supported;No upper extremity supported Sitting balance-Leahy Scale: Fair Sitting balance - Comments: sits near midline without leaning                                    Cognition Arousal/Alertness: Awake/alert Behavior During Therapy: Flat affect Overall Cognitive Status: Impaired/Different from baseline Area of Impairment: Following commands;Attention;Awareness;Problem solving                   Current Attention Level: Focused Memory: Decreased short-term memory Following Commands: Follows one step commands inconsistently;Follows one step commands with increased time   Awareness: Intellectual Problem Solving: Slow processing;Decreased initiation General Comments: patient reports seiing a snake and things flying. MS  appears worse than previous encounter      Exercises General Exercises - Lower Extremity Ankle Circles/Pumps: AROM;Both;10 reps;Supine Long Arc Quad: AROM;AAROM;Both;10 reps    General Comments        Pertinent Vitals/Pain Faces Pain Scale: Hurts even more Pain Location: right thigh Pain Descriptors / Indicators: Sore Pain Intervention(s): Monitored during session    Home Living                       Prior Function            PT Goals (current goals can now be found in the care plan section) Progress towards PT goals: Progressing toward goals    Frequency    Min 2X/week      PT Plan Current plan remains appropriate    Co-evaluation              AM-PAC PT "6 Clicks" Mobility   Outcome Measure  Help needed turning from your back to your side while in a flat bed without using bedrails?: A Lot Help needed moving from lying on your back to sitting on the side of a flat bed without using bedrails?: A Lot Help needed moving to and from a bed to a chair (including a wheelchair)?: A Lot Help needed standing up from a chair using your arms (e.g., wheelchair or bedside chair)?: Total Help needed to walk in hospital room?: Total Help needed climbing 3-5 steps with a railing? : Total 6 Click Score: 9    End of Session   Activity Tolerance: Patient limited by fatigue Patient left: in chair;with call bell/phone within reach;with nursing/sitter in room Nurse Communication: Mobility status;Need for lift equipment PT Visit Diagnosis: Muscle weakness (generalized) (M62.81);Difficulty in walking, not elsewhere classified (R26.2)     Time: 1040-1110 PT Time Calculation (min) (ACUTE ONLY): 30 min  Charges:  $Therapeutic Exercise: 8-22 mins $Therapeutic Activity: 8-22 mins                     Tresa Endo PT Acute Rehabilitation Services Pager [phone] Office [phone]    Claretha Cooper 05/15/2020, 12:46 PM

## 2020-05-15 NOTE — TOC Progression Note (Addendum)
Transition of Care [facility]) - Progression Note    Patient Details  Name: Jay Holloway MRN: [id number] Date of Birth: 1946/03/02  Transition of Care [facility]) CM/SW Contact  Leeroy Cha, RN Phone Number: 05/15/2020, 10:06 AM  Clinical Narrative:    Audelia Hives at [facility], [place] the bipap setting to her, is aware that patient will need a triology vent for home.  Working on getting bipap will call back. Stay approved and bipap will be at the facility today/ tct-Dr. Nigel Bridgeman- pt can not go until tomorrow due to some problems with dehydration.  Expected Discharge Plan: [place] (from ghc) Barriers to Discharge: Barriers Unresolved (comment)  Expected Discharge Plan and Services Expected Discharge Plan: [place] (from [facility])   Discharge Planning Services: CM Consult   Living arrangements for the past 2 months: [place]                                       Social Determinants of Health (SDOH) Interventions    Readmission Risk Interventions No flowsheet data found.

## 2020-05-15 NOTE — Progress Notes (Signed)
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  Admitted with acute on chronic hypercapnic respiratory failure.  Required BiPAP overnight of admission with improvement of PCO2 and resolution of mental status changes.  On admission , 10/13 arterial Blood Gas result:  pO2 42; pCO2 more than 120; pH 7.08;  HCO3 37, %O2 Sat 87. After treatment, 10/14. Arterial Blood Gas result:  pO2 151; pCO2 60; pH 7.43;  HCO3 40, %O2 Sat 99.    Assessment & Plan:   Principal Problem:   Acute respiratory failure with hypoxia and hypercapnia (HCC) Active Problems:   Type 2 diabetes mellitus with hyperglycemia, with long-term current use of insulin (HCC)   Prostate cancer (HCC)   Acute metabolic encephalopathy   CKD (chronic kidney disease), stage II   Pressure injury of sacral region, stage 2 (HCC)   Acute respiratory failure (HCC)  Acute on chronic respiratory failure with hypoxia and hypercapnia, underlying COPD exacerbation, untreated sleep apnea: Patient on supplemental oxygen. SpO2: 100 % O2 Flow Rate (L/min): 2 L/min FiO2 (%): 28 % Using BiPAP at night. Will need Trelegy noninvasive ventilator at home on discharge, social worker consulted. Treated with bronchodilator therapy and steroids, now tapering off. BIPAP settings 14/6, RR 12 and 28% Will need Triology at home   Obstructive sleep apnea: As above.  Will need outpatient sleep study.  Will be followed by pulmonary.  Acute kidney injury: Improved , to baseline 1.6  Adult failure to thrive with debility: PT OT working.  Recommended to SNF.  Type 2 diabetes: Uncontrolled with hyperglycemia with concurrent use of steroids.  On Lantus and Metformin at  home.  Currently remains on long-acting insulin.  Will resume home medication doses on discharge.  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  Continue.   DVT prophylaxis:  apixaban (ELIQUIS) tablet 5 mg   Code Status: DNR Family Communication: None Disposition Plan: Status is: Inpatient  Remains inpatient appropriate because:Unsafe d/c plan   Dispo: The patient is from: Home              Anticipated d/c is to: SNF              Anticipated d/c date is: 1 day              Patient currently is medically stable to d/c.         Consultants:   Pulmonary   Procedures:   None   Antimicrobials:   None     Subjective: Patient seen and examined. States wore 4 hours of BIPAP. Now on room air. Feels terribly weak , denies any other symptoms. Needs to go to rehab, he is hoping to get back to driving and independent living after a short rehab  Objective: Vitals:   05/15/20 0200 05/15/20 0600 05/15/20 0800 05/15/20 0830  BP: (!) 154/85 133/90  (!) 177/91  Pulse: 93 100  (!) 103  Resp: 12 (!) 25  17  Temp:   97.9 F (36.6 C)   TempSrc:   Axillary   SpO2:    100%  Weight:      Height:        Intake/Output Summary (Last 24 hours) at 05/15/2020 0932 Last data filed at 05/14/2020 2200  Gross per 24 hour  Intake --  Output 950 ml  Net -950 ml   Filed Weights   05/10/20 1907 05/10/20 2355  Weight: 96 kg 94.3 kg    Examination:  General exam: Appears calm and comfortable , frail looking  Respiratory system: Clear to auscultation. Respiratory effort normal. No added sounds. Cardiovascular system: S1 & S2 heard, RRR. Chronic venous stasis changes in both legs. Gastrointestinal system: Abdomen is nondistended, soft and nontender. No organomegaly or masses felt. Normal bowel sounds heard. Central nervous system: Alert and oriented. No focal neurological deficits. Extremities: Symmetric 5 x 5 power. Skin: No rashes, lesions or ulcers Psychiatry: Judgement and insight  appear normal. Mood & affect flat    Data Reviewed: I have personally reviewed following labs and imaging studies  CBC: Recent Labs  Lab 05/10/20 1945 05/10/20 1945 05/11/20 0414 05/11/20 0414 05/11/20 0808 05/12/20 0253 05/13/20 0309 05/14/20 0310 05/15/20 0259  WBC 10.8*   < > 10.3  --   --  11.7* 8.5 8.8 7.0  NEUTROABS 7.8*  --   --   --   --   --   --   --   --   HGB 10.0*   < > 9.5*   < > 10.2* 7.8* 7.8* 9.1* 8.5*  HCT 35.5*   < > 32.3*   < > 30.0* 25.6* 25.9* 29.9* 26.6*  MCV 98.6   < > 97.0  --   --  93.8 93.2 93.7 90.8  PLT 530*   < > 508*  --   --  421* 441* 443* 420*   < > = values in this interval not displayed.   Basic Metabolic Panel: Recent Labs  Lab 05/10/20 1945 05/11/20 0414 05/11/20 0808 05/12/20 0701 05/14/20 0310  NA 143 144 140 140  --   K 5.3* 5.1 5.3* 5.4*  --   CL 98 96*  --  98  --   CO2 35* 34*  --  33*  --   GLUCOSE 159* 173*  --  266*  --   BUN 12 19  --  26*  --   CREATININE 0.91 0.96  --  1.92* 1.60*  CALCIUM 8.6* 8.8*  --  7.9*  --    GFR: Estimated Creatinine Clearance: 49 mL/min (A) (by C-G formula based on SCr of 1.6 mg/dL (H)). Liver Function Tests: Recent Labs  Lab 05/10/20 1945 05/11/20 0414  AST 20 15  ALT 21 19  ALKPHOS 104 94  BILITOT 1.1 1.5*  PROT 6.1* 6.0*  ALBUMIN 2.8* 2.6*   No results for input(s): LIPASE, AMYLASE in the last 168 hours. Recent Labs  Lab 05/11/20 0809  AMMONIA 15   Coagulation Profile: No results for input(s): INR, PROTIME in the last 168 hours. Cardiac Enzymes: No results for input(s): CKTOTAL, CKMB, CKMBINDEX, TROPONINI in the last 168 hours. BNP (last 3 results) No results for input(s): PROBNP in the last 8760 hours. HbA1C: No results for input(s): HGBA1C in the last 72 hours. CBG: Recent Labs  Lab 05/14/20 1219 05/14/20 1622 05/14/20 2120 05/15/20 0801 05/15/20 0804  GLUCAP 195* 243* 274* 183* 160*   Lipid Profile: No results for input(s): CHOL, HDL, LDLCALC, TRIG,  CHOLHDL, LDLDIRECT in the last 72 hours. Thyroid Function Tests: No results for input(s): TSH, T4TOTAL, FREET4, T3FREE, THYROIDAB in the last 72 hours. Anemia Panel: No results for input(s): VITAMINB12, FOLATE, FERRITIN, TIBC, IRON, RETICCTPCT in the last 72 hours. Sepsis Labs: No results for input(s): PROCALCITON,  LATICACIDVEN in the last 168 hours.  Recent Results (from the past 240 hour(s))  Respiratory Panel by RT PCR (Flu A&B, Covid) - Nasopharyngeal Swab     Status: None   Collection Time: 05/10/20  7:58 PM   Specimen: Nasopharyngeal Swab  Result Value Ref Range Status   SARS Coronavirus 2 by RT PCR NEGATIVE NEGATIVE Final    Comment: (NOTE) SARS-CoV-2 target nucleic acids are NOT DETECTED.  The SARS-CoV-2 RNA is generally detectable in upper respiratoy specimens during the acute phase of infection. The lowest concentration of SARS-CoV-2 viral copies this assay can detect is 131 copies/mL. A negative result does not preclude SARS-Cov-2 infection and should not be used as the sole basis for treatment or other patient management decisions. A negative result may occur with  improper specimen collection/handling, submission of specimen other than nasopharyngeal swab, presence of viral mutation(s) within the areas targeted by this assay, and inadequate number of viral copies (<131 copies/mL). A negative result must be combined with clinical observations, patient history, and epidemiological information. The expected result is Negative.  Fact Sheet for Patients:  PinkCheek.be  Fact Sheet for Healthcare Providers:  GravelBags.it  This test is no t yet approved or cleared by the [place] FDA and  has been authorized for detection and/or diagnosis of SARS-CoV-2 by FDA under an Emergency Use Authorization (EUA). This EUA will remain  in effect (meaning this test can be used) for the duration of the COVID-19 declaration  under Section 564(b)(1) of the Act, 21 U.S.C. section 360bbb-3(b)(1), unless the authorization is terminated or revoked sooner.     Influenza A by PCR NEGATIVE NEGATIVE Final   Influenza B by PCR NEGATIVE NEGATIVE Final    Comment: (NOTE) The Xpert Xpress SARS-CoV-2/FLU/RSV assay is intended as an aid in  the diagnosis of influenza from Nasopharyngeal swab specimens and  should not be used as a sole basis for treatment. Nasal washings and  aspirates are unacceptable for Xpert Xpress SARS-CoV-2/FLU/RSV  testing.  Fact Sheet for Patients: PinkCheek.be  Fact Sheet for Healthcare Providers: GravelBags.it  This test is not yet approved or cleared by the [place] FDA and  has been authorized for detection and/or diagnosis of SARS-CoV-2 by  FDA under an Emergency Use Authorization (EUA). This EUA will remain  in effect (meaning this test can be used) for the duration of the  Covid-19 declaration under Section 564(b)(1) of the Act, 21  U.S.C. section 360bbb-3(b)(1), unless the authorization is  terminated or revoked. Performed at [facility], [place] [address] [place], [place] [address]   MRSA PCR Screening     Status: None   Collection Time: 05/11/20 12:04 AM   Specimen: Nasal Mucosa; Nasopharyngeal  Result Value Ref Range Status   MRSA by PCR NEGATIVE NEGATIVE Final    Comment:        The GeneXpert MRSA Assay (FDA approved for NASAL specimens only), is one component of a comprehensive MRSA colonization surveillance program. It is not intended to diagnose MRSA infection nor to guide or monitor treatment for MRSA infections. Performed at [facility], [place] [address] [place], [place] [address]          Radiology Studies: No results found.      Scheduled Meds: . apixaban  5 mg Oral BID  . atorvastatin  10 mg Oral Daily  . Chlorhexidine Gluconate Cloth  6 each  Topical Daily  . cholecalciferol  1,000 Units Oral Daily  . feeding supplement  237 mL Oral BID BM  . furosemide  20 mg Oral Daily  . insulin aspart  0-5 Units Subcutaneous QHS  . insulin aspart  0-9 Units Subcutaneous TID WC  . insulin glargine  10 Units Subcutaneous Daily  . mouth rinse  15 mL Mouth Rinse BID  . multivitamin  15 mL Oral Daily  . pantoprazole  40 mg Oral BID  . predniSONE  40 mg Oral Q breakfast   Continuous Infusions:   LOS: 5 days    Time spent: 35 minutes     Barb Merino, MD [facility] Pager [phone]

## 2020-05-15 NOTE — TOC Progression Note (Signed)
Transition of Care [name]) - Progression Note    Patient Details  Name: Vittorio Mohs MRN: [id number] Date of Birth: 1946-01-22  Transition of Care [facility]) CM/SW Contact  Leeroy Cha, RN Phone Number: 05/15/2020, 8:08 AM  Clinical Narrative:    On bipap at 28/%, folling for proigression  Plan snf when stable.   Expected Discharge Plan: [place] (from [facility]) Barriers to Discharge: Barriers Unresolved (comment)  Expected Discharge Plan and Services Expected Discharge Plan: [place] (from [facility])   Discharge Planning Services: CM Consult   Living arrangements for the past 2 months: [place]                                       Social Determinants of Health (SDOH) Interventions    Readmission Risk Interventions No flowsheet data found.

## 2020-05-16 ENCOUNTER — Inpatient Hospital Stay (HOSPITAL_COMMUNITY): Payer: Medicare HMO

## 2020-05-16 DIAGNOSIS — R0602 Shortness of breath: Secondary | ICD-10-CM | POA: Diagnosis not present

## 2020-05-16 DIAGNOSIS — J9601 Acute respiratory failure with hypoxia: Secondary | ICD-10-CM | POA: Diagnosis not present

## 2020-05-16 DIAGNOSIS — J9602 Acute respiratory failure with hypercapnia: Secondary | ICD-10-CM | POA: Diagnosis not present

## 2020-05-16 LAB — BLOOD GAS, ARTERIAL
Acid-Base Excess: 17.3 mmol/L — ABNORMAL HIGH (ref 0.0–2.0)
Bicarbonate: 42.6 mmol/L — ABNORMAL HIGH (ref 20.0–28.0)
Drawn by: 29503
FIO2: 21
O2 Saturation: 93.9 %
Patient temperature: 98.6
pCO2 arterial: 52.1 mmHg — ABNORMAL HIGH (ref 32.0–48.0)
pH, Arterial: 7.522 — ABNORMAL HIGH (ref 7.350–7.450)
pO2, Arterial: 64.7 mmHg — ABNORMAL LOW (ref 83.0–108.0)

## 2020-05-16 LAB — GLUCOSE, CAPILLARY
Glucose-Capillary: 184 mg/dL — ABNORMAL HIGH (ref 70–99)
Glucose-Capillary: 128 mg/dL — ABNORMAL HIGH (ref 70–99)
Glucose-Capillary: 97 mg/dL (ref 70–99)

## 2020-05-16 LAB — CBC
HCT: 29.1 % — ABNORMAL LOW (ref 39.0–52.0)
Hemoglobin: 9.6 g/dL — ABNORMAL LOW (ref 13.0–17.0)
MCH: 28.9 pg (ref 26.0–34.0)
MCHC: 33 g/dL (ref 30.0–36.0)
MCV: 87.7 fL (ref 80.0–100.0)
Platelets: 414 10*3/uL — ABNORMAL HIGH (ref 150–400)
RBC: 3.32 MIL/uL — ABNORMAL LOW (ref 4.22–5.81)
RDW: 15.9 % — ABNORMAL HIGH (ref 11.5–15.5)
WBC: 7.9 10*3/uL (ref 4.0–10.5)
nRBC: 0 % (ref 0.0–0.2)

## 2020-05-16 LAB — ECHOCARDIOGRAM LIMITED
Height: 72 in
S' Lateral: 2.4 cm
Single Plane A4C EF: 62 %
Weight: 3326.3 oz

## 2020-05-16 MED ORDER — ACETAMINOPHEN 325 MG PO TABS
650.0000 mg | ORAL_TABLET | Freq: Four times a day (QID) | ORAL | Status: DC | PRN
  Administered 2020-05-17 – 2020-05-18 (×3): 650 mg via ORAL
  Filled 2020-05-16 (×3): qty 2

## 2020-05-16 MED ORDER — PERFLUTREN LIPID MICROSPHERE
1.0000 mL | INTRAVENOUS | Status: AC | PRN
  Administered 2020-05-16: 2 mL via INTRAVENOUS
  Filled 2020-05-16: qty 10

## 2020-05-16 NOTE — Progress Notes (Signed)
  Echocardiogram 2D Echocardiogram has been performed.  Bobbye Charleston 05/16/2020, 1:33 PM

## 2020-05-16 NOTE — Progress Notes (Signed)
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  Admitted with acute on chronic hypercapnic respiratory failure.  Required BiPAP overnight of admission with improvement of PCO2 and resolution of mental status changes.  On admission , 10/13 arterial Blood Gas result:  pO2 42; pCO2 more than 120; pH 7.08;  HCO3 37, %O2 Sat 87. After treatment, 10/14. Arterial Blood Gas result:  pO2 151; pCO2 60; pH 7.43;  HCO3 40, %O2 Sat 99. 10/19, patient remains intermittently confused, repeat blood gas with no CO2 retention. Patient has not used much of the BiPAP at night, once he is more awake he removes the BiPAP as he does not tolerate it. Having sinus tachycardia.   Assessment & Plan:   Principal Problem:   Acute respiratory failure with hypoxia and hypercapnia (HCC) Active Problems:   Type 2 diabetes mellitus with hyperglycemia, with long-term current use of insulin (HCC)   Prostate cancer (HCC)   Acute metabolic encephalopathy   CKD (chronic kidney disease), stage II   Pressure injury of sacral region, stage 2 (HCC)   Acute respiratory failure (HCC)  Acute on chronic respiratory failure with hypoxia and hypercapnia, underlying COPD exacerbation, untreated sleep apnea: Patient on supplemental oxygen. SpO2: 100 % O2 Flow Rate (L/min): 2 L/min FiO2 (%): 28 % Using BiPAP at night, however needs much encouragement.  Suggested to use whenever able to use even in the daytime. Will need Trilogy noninvasive ventilator at home on discharge, social worker working on it.  He will have Trilogy before discharge from skilled nursing facility. Treated with bronchodilator therapy and steroids.  Tapered off his steroids  because of confusion. Albuterol changed to levo albuterol to avoid tachycardiac. BIPAP settings 14/6, RR 12 and 28%  Obstructive sleep apnea: As above.  Trelegy at home.  Acute kidney injury: Improved , to baseline 1.6  Adult failure to thrive with debility: PT OT working.  Recommended to SNF.  Type 2 diabetes: Uncontrolled with hyperglycemia with concurrent use of steroids.  On Lantus and Metformin at home.  Currently remains on long-acting insulin.  Will resume home medication doses on discharge. Off steroids now.  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  Continue.  Metabolic encephalopathy: Continues to have intermittent confusion.  No evidence of bacterial infection.  No focal neurological deficit. Off steroids Suspect ICU induced delirium.  Will discontinue tramadol and use Tylenol.  Sinus tachycardia: Persistent sinus tachycardia.  Recent normal echocardiogram.  Will recheck limited echo to evaluate for ejection fraction. TSH was normal. Use short acting Cardizem to help with blood pressure and control the heart rate.  Will avoid beta-blockers. Can change to long-acting Cardizem on discharge.  DVT prophylaxis:  apixaban (ELIQUIS) tablet 5 mg   Code Status: DNR Family Communication: Patient's nephew on the phone. Disposition Plan: Status is: Inpatient  Remains inpatient appropriate because:Unsafe d/c plan   Dispo: The patient is from: Home              Anticipated d/c is to: SNF              Anticipated d/c date is: 1 day, tomorrow if confusion improved and heart rate better.              Patient currently  is not medically stable today.         Consultants:   Pulmonary   Procedures:   None   Antimicrobials:   None     Subjective: Patient seen and examined.  Patient says he wore BiPAP at night, however nursing reported that he even did not wear it for half an hour.  Patient denies any complaints. He is just repeating some sentences over and over  again. Mostly on room air. Right thigh is weak and has difficulty walking.  Patient says he fell on the right leg and had time walking since then.  Objective: Vitals:   05/16/20 0700 05/16/20 0800 05/16/20 0900 05/16/20 1000  BP:  (!) 155/97    Pulse: 98 (!) 115 (!) 113 (!) 116  Resp: 12 15 (!) 21 19  Temp:  97.9 F (36.6 C)    TempSrc:  Axillary    SpO2: 100% 100%    Weight:      Height:        Intake/Output Summary (Last 24 hours) at 05/16/2020 1124 Last data filed at 05/16/2020 0600 Gross per 24 hour  Intake --  Output 1400 ml  Net -1400 ml   Filed Weights   05/10/20 1907 05/10/20 2355  Weight: 96 kg 94.3 kg    Examination:  General exam: Appears calm and comfortable , frail looking  Pleasant, slightly impulsive and repeating same sentence again again. Respiratory system: Clear to auscultation. Respiratory effort normal. No added sounds.  On room air. Cardiovascular system: S1 & S2 heard, RRR. Chronic venous stasis changes in both legs. Gastrointestinal system: Abdomen is nondistended, soft and nontender. No organomegaly or masses felt. Normal bowel sounds heard. Central nervous system: Alert and oriented x2-3. Extremities: Generalized weakness.  Right quadriceps weaker than left. Skin: No rashes, lesions or ulcers Psychiatry: Judgement and insight appear normal. Mood & affect flat    Data Reviewed: I have personally reviewed following labs and imaging studies  CBC: Recent Labs  Lab 05/10/20 1945 05/11/20 0414 05/12/20 0253 05/13/20 0309 05/14/20 0310 05/15/20 0259 05/16/20 0313  WBC 10.8*   < > 11.7* 8.5 8.8 7.0 7.9  NEUTROABS 7.8*  --   --   --   --   --   --   HGB 10.0*   < > 7.8* 7.8* 9.1* 8.5* 9.6*  HCT 35.5*   < > 25.6* 25.9* 29.9* 26.6* 29.1*  MCV 98.6   < > 93.8 93.2 93.7 90.8 87.7  PLT 530*   < > 421* 441* 443* 420* 414*   < > = values in this interval not displayed.   Basic Metabolic Panel: Recent Labs  Lab 05/10/20 1945 05/11/20 0414  05/11/20 0808 05/12/20 0701 05/14/20 0310 05/15/20 1634  NA 143 144 140 140  --  137  K 5.3* 5.1 5.3* 5.4*  --  4.4  CL 98 96*  --  98  --  89*  CO2 35* 34*  --  33*  --  38*  GLUCOSE 159* 173*  --  266*  --  246*  BUN 12 19  --  26*  --  26*  CREATININE 0.91 0.96  --  1.92* 1.60* 1.34*  CALCIUM 8.6* 8.8*  --  7.9*  --  9.0   GFR: Estimated Creatinine Clearance: 58.5 mL/min (A) (by C-G formula based on SCr of 1.34 mg/dL (H)). Liver Function Tests: Recent Labs  Lab 05/10/20 1945 05/11/20 0414 05/15/20 1634  AST 20 15 27   ALT 21  19 32  ALKPHOS 104 94 93  BILITOT 1.1 1.5* 1.0  PROT 6.1* 6.0* 6.2*  ALBUMIN 2.8* 2.6* 3.0*   No results for input(s): LIPASE, AMYLASE in the last 168 hours. Recent Labs  Lab 05/11/20 0809  AMMONIA 15   Coagulation Profile: No results for input(s): INR, PROTIME in the last 168 hours. Cardiac Enzymes: No results for input(s): CKTOTAL, CKMB, CKMBINDEX, TROPONINI in the last 168 hours. BNP (last 3 results) No results for input(s): PROBNP in the last 8760 hours. HbA1C: No results for input(s): HGBA1C in the last 72 hours. CBG: Recent Labs  Lab 05/15/20 0804 05/15/20 1221 05/15/20 1628 05/15/20 2039 05/16/20 0801  GLUCAP 160* 156* 271* 283* 184*   Lipid Profile: No results for input(s): CHOL, HDL, LDLCALC, TRIG, CHOLHDL, LDLDIRECT in the last 72 hours. Thyroid Function Tests: Recent Labs    05/15/20 1514  TSH 2.825   Anemia Panel: No results for input(s): VITAMINB12, FOLATE, FERRITIN, TIBC, IRON, RETICCTPCT in the last 72 hours. Sepsis Labs: No results for input(s): PROCALCITON, LATICACIDVEN in the last 168 hours.  Recent Results (from the past 240 hour(s))  Respiratory Panel by RT PCR (Flu A&B, Covid) - Nasopharyngeal Swab     Status: None   Collection Time: 05/10/20  7:58 PM   Specimen: Nasopharyngeal Swab  Result Value Ref Range Status   SARS Coronavirus 2 by RT PCR NEGATIVE NEGATIVE Final    Comment: (NOTE) SARS-CoV-2  target nucleic acids are NOT DETECTED.  The SARS-CoV-2 RNA is generally detectable in upper respiratoy specimens during the acute phase of infection. The lowest concentration of SARS-CoV-2 viral copies this assay can detect is 131 copies/mL. A negative result does not preclude SARS-Cov-2 infection and should not be used as the sole basis for treatment or other patient management decisions. A negative result may occur with  improper specimen collection/handling, submission of specimen other than nasopharyngeal swab, presence of viral mutation(s) within the areas targeted by this assay, and inadequate number of viral copies (<131 copies/mL). A negative result must be combined with clinical observations, patient history, and epidemiological information. The expected result is Negative.  Fact Sheet for Patients:  PinkCheek.be  Fact Sheet for Healthcare Providers:  GravelBags.it  This test is no t yet approved or cleared by the [place] FDA and  has been authorized for detection and/or diagnosis of SARS-CoV-2 by FDA under an Emergency Use Authorization (EUA). This EUA will remain  in effect (meaning this test can be used) for the duration of the COVID-19 declaration under Section 564(b)(1) of the Act, 21 U.S.C. section 360bbb-3(b)(1), unless the authorization is terminated or revoked sooner.     Influenza A by PCR NEGATIVE NEGATIVE Final   Influenza B by PCR NEGATIVE NEGATIVE Final    Comment: (NOTE) The Xpert Xpress SARS-CoV-2/FLU/RSV assay is intended as an aid in  the diagnosis of influenza from Nasopharyngeal swab specimens and  should not be used as a sole basis for treatment. Nasal washings and  aspirates are unacceptable for Xpert Xpress SARS-CoV-2/FLU/RSV  testing.  Fact Sheet for Patients: PinkCheek.be  Fact Sheet for Healthcare  Providers: GravelBags.it  This test is not yet approved or cleared by the [place] FDA and  has been authorized for detection and/or diagnosis of SARS-CoV-2 by  FDA under an Emergency Use Authorization (EUA). This EUA will remain  in effect (meaning this test can be used) for the duration of the  Covid-19 declaration under Section 564(b)(1) of the Act, 21  U.S.C. section  360bbb-3(b)(1), unless the authorization is  terminated or revoked. Performed at [facility], [place] [address], [place], [place] [address]   MRSA PCR Screening     Status: None   Collection Time: 05/11/20 12:04 AM   Specimen: Nasal Mucosa; Nasopharyngeal  Result Value Ref Range Status   MRSA by PCR NEGATIVE NEGATIVE Final    Comment:        The GeneXpert MRSA Assay (FDA approved for NASAL specimens only), is one component of a comprehensive MRSA colonization surveillance program. It is not intended to diagnose MRSA infection nor to guide or monitor treatment for MRSA infections. Performed at [facility], [place] [address] [place], [place] [address]   Respiratory Panel by RT PCR (Flu A&B, Covid) - Nasopharyngeal Swab     Status: None   Collection Time: 05/15/20  1:09 PM   Specimen: Nasopharyngeal Swab  Result Value Ref Range Status   SARS Coronavirus 2 by RT PCR NEGATIVE NEGATIVE Final    Comment: (NOTE) SARS-CoV-2 target nucleic acids are NOT DETECTED.  The SARS-CoV-2 RNA is generally detectable in upper respiratoy specimens during the acute phase of infection. The lowest concentration of SARS-CoV-2 viral copies this assay can detect is 131 copies/mL. A negative result does not preclude SARS-Cov-2 infection and should not be used as the sole basis for treatment or other patient management decisions. A negative result may occur with  improper specimen collection/handling, submission of specimen other than nasopharyngeal swab, presence of  viral mutation(s) within the areas targeted by this assay, and inadequate number of viral copies (<131 copies/mL). A negative result must be combined with clinical observations, patient history, and epidemiological information. The expected result is Negative.  Fact Sheet for Patients:  PinkCheek.be  Fact Sheet for Healthcare Providers:  GravelBags.it  This test is no t yet approved or cleared by the [place] FDA and  has been authorized for detection and/or diagnosis of SARS-CoV-2 by FDA under an Emergency Use Authorization (EUA). This EUA will remain  in effect (meaning this test can be used) for the duration of the COVID-19 declaration under Section 564(b)(1) of the Act, 21 U.S.C. section 360bbb-3(b)(1), unless the authorization is terminated or revoked sooner.     Influenza A by PCR NEGATIVE NEGATIVE Final   Influenza B by PCR NEGATIVE NEGATIVE Final    Comment: (NOTE) The Xpert Xpress SARS-CoV-2/FLU/RSV assay is intended as an aid in  the diagnosis of influenza from Nasopharyngeal swab specimens and  should not be used as a sole basis for treatment. Nasal washings and  aspirates are unacceptable for Xpert Xpress SARS-CoV-2/FLU/RSV  testing.  Fact Sheet for Patients: PinkCheek.be  Fact Sheet for Healthcare Providers: GravelBags.it  This test is not yet approved or cleared by the [place] FDA and  has been authorized for detection and/or diagnosis of SARS-CoV-2 by  FDA under an Emergency Use Authorization (EUA). This EUA will remain  in effect (meaning this test can be used) for the duration of the  Covid-19 declaration under Section 564(b)(1) of the Act, 21  U.S.C. section 360bbb-3(b)(1), unless the authorization is  terminated or revoked. Performed at [facility], [place] [address], [place],  [address]           Radiology Studies: DG CHEST PORT 1 VIEW  Result Date: 05/15/2020 CLINICAL DATA:  Altered mental status, acute respiratory failure, hypoxia EXAM: PORTABLE CHEST 1 VIEW COMPARISON:  05/10/2020 FINDINGS: Lung volumes are small and there is bibasilar atelectasis. No  superimposed confluent pulmonary infiltrate. No pneumothorax or pleural effusion. Cardiac size within normal limits. Pulmonary vascularity is normal. No acute bone abnormality. IMPRESSION: Pulmonary hypoinflation. Electronically Signed   By: Fidela Salisbury MD   On: 05/15/2020 15:43        Scheduled Meds: . apixaban  5 mg Oral BID  . atorvastatin  10 mg Oral Daily  . Chlorhexidine Gluconate Cloth  6 each Topical Daily  . cholecalciferol  1,000 Units Oral Daily  . diltiazem  60 mg Oral Q8H  . feeding supplement  237 mL Oral BID BM  . furosemide  20 mg Oral Daily  . insulin aspart  0-5 Units Subcutaneous QHS  . insulin aspart  0-9 Units Subcutaneous TID WC  . insulin glargine  10 Units Subcutaneous Daily  . mouth rinse  15 mL Mouth Rinse BID  . multivitamin  15 mL Oral Daily  . pantoprazole  40 mg Oral BID   Continuous Infusions:   LOS: 6 days    Time spent: 35 minutes     Barb Merino, MD [facility] Pager [phone]

## 2020-05-16 NOTE — TOC Progression Note (Addendum)
Transition of Care [facility]) - Progression Note    Patient Details  Name: Jay Holloway MRN: [id number] Date of Birth: Oct 22, 1945  [facility] CM/SW Contact  Michalina Calbert, Juliann Pulse, RN Phone Number: 05/16/2020, 11:54 AM  Clinical Narrative: Transferred from [place] not medically stable today. [place] for SNF-has auth;bipap @ SNF, home vent already ordered.      Expected Discharge Plan: [place] [facility]) Barriers to Discharge: Continued Medical Work up  Expected Discharge Plan and Services Expected Discharge Plan: [place] (from [facility])   Discharge Planning Services: CM Consult   Living arrangements for the past 2 months: [place]                                       Social Determinants of Health (SDOH) Interventions    Readmission Risk Interventions No flowsheet data found.

## 2020-05-16 NOTE — Progress Notes (Signed)
Pt continues to agree to wear BiPAP when RT initially places him on it and then removing it shortly after RT leaves the room and refusing to put it back on.  Pt has not worn BiPAP for more than 30 minutes over the last 4 nights.

## 2020-05-16 NOTE — Progress Notes (Signed)
Patient refuses BiPAP tonight. RN made aware. Equipment remains on standby.

## 2020-05-17 DIAGNOSIS — J9601 Acute respiratory failure with hypoxia: Secondary | ICD-10-CM | POA: Diagnosis not present

## 2020-05-17 DIAGNOSIS — J9602 Acute respiratory failure with hypercapnia: Secondary | ICD-10-CM | POA: Diagnosis not present

## 2020-05-17 DIAGNOSIS — G9341 Metabolic encephalopathy: Secondary | ICD-10-CM | POA: Diagnosis not present

## 2020-05-17 LAB — BASIC METABOLIC PANEL
Anion gap: 8 (ref 5–15)
BUN: 20 mg/dL (ref 8–23)
CO2: 38 mmol/L — ABNORMAL HIGH (ref 22–32)
Calcium: 8.4 mg/dL — ABNORMAL LOW (ref 8.9–10.3)
Chloride: 92 mmol/L — ABNORMAL LOW (ref 98–111)
Creatinine, Ser: 1.28 mg/dL — ABNORMAL HIGH (ref 0.61–1.24)
GFR, Estimated: 55 mL/min — ABNORMAL LOW (ref >60)
Glucose, Bld: 128 mg/dL — ABNORMAL HIGH (ref 70–99)
Potassium: 3.8 mmol/L (ref 3.5–5.1)
Sodium: 138 mmol/L (ref 135–145)

## 2020-05-17 LAB — GLUCOSE, CAPILLARY
Glucose-Capillary: 200 mg/dL — ABNORMAL HIGH (ref 70–99)
Glucose-Capillary: 137 mg/dL — ABNORMAL HIGH (ref 70–99)
Glucose-Capillary: 144 mg/dL — ABNORMAL HIGH (ref 70–99)
Glucose-Capillary: 118 mg/dL — ABNORMAL HIGH (ref 70–99)
Glucose-Capillary: 124 mg/dL — ABNORMAL HIGH (ref 70–99)

## 2020-05-17 MED ORDER — SODIUM CHLORIDE 0.9 % IV BOLUS
500.0000 mL | Freq: Once | INTRAVENOUS | Status: AC
  Administered 2020-05-17: 500 mL via INTRAVENOUS

## 2020-05-17 MED ORDER — LEVALBUTEROL HCL 1.25 MG/0.5ML IN NEBU: 0.6300 mg | INHALATION_SOLUTION | Freq: Four times a day (QID) | RESPIRATORY_TRACT | Status: DC | PRN

## 2020-05-17 NOTE — Care Management Important Message (Signed)
Important Message  Patient Details IM Letter given to the Patient Name: Jay Holloway MRN: [id number] Date of Birth: 07-06-1946   Medicare Important Message Given:  Yes     Kerin Salen 05/17/2020, 11:04 AM

## 2020-05-17 NOTE — Progress Notes (Addendum)
PROGRESS NOTE   Jay Holloway  ZMO:[id number]    DOB: Mar 25, 1946    DOA: 05/10/2020  PCP: [name], MD   I have briefly reviewed patients previous medical records in [facility].  Chief Complaint  Patient presents with  . Altered Mental Status    Brief Narrative:  74 year old male with PMH of DM2, HTN, prostate cancer, recent hospitalization for hypercarbic respiratory failure possibly from undiagnosed COPD/sleep apnea, prior to that also admitted for DKA, recently diagnosed with acute DVT/PE and on apixaban, sent to the ED from SNF where he was found to be lethargic.  Admitted to stepdown unit for acute on chronic hypercarbic and hypoxic respiratory failure with associated acute metabolic encephalopathy.  Improved with BiPAP.  Pulmonology consulted and signed off.  Course complicated by hypotension (resolved) and tachycardia.   Assessment & Plan:  Principal Problem:   Acute respiratory failure with hypoxia and hypercapnia (HCC) Active Problems:   Type 2 diabetes mellitus with hyperglycemia, with long-term current use of insulin (HCC)   Prostate cancer (HCC)   Acute metabolic encephalopathy   CKD (chronic kidney disease), stage II   Pressure injury of sacral region, stage 2 (HCC)   Acute respiratory failure (HCC)   Acute on chronic respiratory failure with hypoxia and hypercarbia/possible COPD exacerbation:  Appears to be a recurrent presentation.  VBG on admission: pH 7.081, PCO2 >120, PO2 42.5, HCO3 37.4 and oxygen saturation 57.4.  Treated with BiPAP with improvement.  However patient inconsistent with BiPAP use at night despite counseling.  Continue BiPAP at night, daytime when napping and as needed for AMS and continue oxygen 2 L/min in daytime. Suspect chronic CO2 retainer.  UDS negative.  Ammonia level normal.  Pulmonology consultation appreciated.  They suspect that he has COPD and undiagnosed OSA and recommend: Steroids, bronchodilators, PFT and sleep study as  outpatient although they are not sure if he will get to that point for follow-up in the clinic and be able to cooperate for the test, noninvasive ventilation and would be a good candidate for trilogy vent which reportedly has been arranged for SNF as per discussion with TOC.  BIPAP settings 14/6, RR 12 and 28%  Outpatient pulmonology follow-up.   Acute metabolic encephalopathy:  Improved compared to admission.  Intermittent confusion ongoing, but may be his baseline.  COPD exacerbation:  Resolved.  Steroids rapidly tapered due to issues with confusion.  Bronchodilators and flutter valve.  Possible OSA  Management as outlined above.  Acute metabolic encephalopathy:  Secondary to hypercarbic and hypoxic respiratory failure.  Resolved.  Avoid sedatives and narcotics as much as possible.  Acute kidney injury:  Creatinine had peaked to 1.92.  Likely related to hypotension/possible ATN on 10/14.  Resolved.  Creatinine 1.28.  Acute urinary retention:  Occurred early on in hospitalization.  Seems to have resolved.  Mild hyperkalemia  Unclear etiology.    Resolved.  Diarrhea:  May be self-limiting?  Related to Glucerna.  Resolved.  Type II DM:  Continue SSI and monitor CBGs closely.   Recent admission for DKA.  Consulted diabetes coordinator.  Resumed low-dose Lantus.  Glucerna was likely causing diarrhea, stopped and now on Ensure but not sure if he will even drink this-refusing.  Good inpatient control as noted below.  Recent RLE DVT/PE:  Due to AMS on admission, was placed on IV heparin drip and now transitioned back to prior Eliquis.  TTE 05/16/2020: LVEF 60-65%.  McConnell sign noted in RV apex.  Right ventricular systolic function is  moderately reduced.  Right ventricular size is mildly enlarged.  Patient noted to have sinus tachycardia and occasional episode of nonsustained SVT.  He is not clinically volume overloaded.  This tachycardia may  be related to recent PE.  Has already received today's dose of Lasix-we will hold for now.  IV normal saline bolus 500 mL x 1 and monitor.  Hopefully with volume resuscitation, tachycardia improves.  Avoiding beta-blockers at this time.  Sinus tachycardia/nonsustained SVT  Couple of days back, patient was started on Cardizem 60 mg every 8 hours for this. TSH wnl.    TTE shows normal LVEF.  However given recent PE, bolusing with normal saline.  Continue Cardizem for now which he seems to be tolerating but may need to consider weaning off.  Essential hypertension/hypotension:  Hypotension resolved.  Mildly hypertensive at times.  Monitor.  Anemia of chronic disease  Hemoglobin stable in the 9 g range mostly.  Chronic pain:  Reports longstanding history of bilateral lower extremity pain.  No localizing pain reports diffuse pain.  Indicates that he has been on oxycodone for a long time.  Started tramadol.  Controlled.  No further changes.  Adult failure to thrive  Multifactorial due to significant comorbidities.  PT and OT evaluation recommend SNF.   Pressure Injury 05/10/20 Coccyx Right Stage 2 -  Partial thickness loss of dermis presenting as a shallow open injury with a red, pink wound bed without slough. (Active)  05/10/20 2300  Location: Coccyx  Location Orientation: Right  Staging: Stage 2 -  Partial thickness loss of dermis presenting as a shallow open injury with a red, pink wound bed without slough.  Wound Description (Comments):   Present on Admission: Yes     Pressure Injury 05/10/20 Coccyx Left Stage 2 -  Partial thickness loss of dermis presenting as a shallow open injury with a red, pink wound bed without slough. (Active)  05/10/20 2300  Location: Coccyx  Location Orientation: Left  Staging: Stage 2 -  Partial thickness loss of dermis presenting as a shallow open injury with a red, pink wound bed without slough.  Wound Description (Comments):   Present on  Admission:      Pressure Injury 05/10/20 Heel Right Deep Tissue Pressure Injury - Purple or maroon localized area of discolored intact skin or blood-filled blister due to damage of underlying soft tissue from pressure and/or shear. (Active)  05/10/20 2300  Location: Heel  Location Orientation: Right  Staging: Deep Tissue Pressure Injury - Purple or maroon localized area of discolored intact skin or blood-filled blister due to damage of underlying soft tissue from pressure and/or shear.  Wound Description (Comments):   Present on Admission:      Body mass index is 28.2 kg/m.  Nutritional Status Nutrition Problem: Increased nutrient needs Etiology: wound healing Signs/Symptoms: estimated needs Interventions: Ensure Enlive (each supplement provides 350kcal and 20 grams of protein), MVI, Magic cup  DVT prophylaxis:      Code Status: DNR Family Communication:  Disposition:  Status is: Inpatient  Remains inpatient appropriate because:Inpatient level of care appropriate due to severity of illness   Dispo: The patient is from: Home              Anticipated d/c is to: SNF              Anticipated d/c date is: 2 days              Patient currently is not medically stable to d/c.  Consultants:   Pulmonology  Procedures:   BiPAP  Antimicrobials:    Anti-infectives (From admission, onward)   None        Subjective:  Denies dyspnea.  Reports chronic leg pain but better compared to prior.  No other complaints reported  Objective:   Vitals:   05/16/20 2251 05/16/20 2349 05/17/20 0506 05/17/20 1341  BP: (!) 141/90 135/85 (!) 142/90 125/82  Pulse: (!) 108 (!) 105 (!) 103 (!) 102  Resp: 17 18 16 18   Temp: 98.3 F (36.8 C) 98.3 F (36.8 C) 97.9 F (36.6 C) 97.9 F (36.6 C)  TempSrc: Oral Oral Oral Oral  SpO2: 95% 98% 96% 99%  Weight:      Height:        General exam: Elderly male, moderately built and nourished sitting up comfortably in bed without  distress. Respiratory system: Distant sounding breath sounds but clear to auscultation without wheezing, rhonchi or crackles. Respiratory effort normal. Cardiovascular system: S1 & S2 heard, RRR. No JVD, murmurs, rubs, gallops or clicks. No pedal edema.  Telemetry personally reviewed: Sinus rhythm-sinus tachycardia in the 100s at rest but up to 120s with activity and also noted brief nonsustained SVT. Gastrointestinal system: Abdomen is nondistended, soft and nontender. No organomegaly or masses felt. Normal bowel sounds heard. Central nervous system: Alert and oriented to self, place, partly to time. No focal neurological deficits. Extremities: Symmetric 5 x 5 power. Skin: No rashes, lesions or ulcers Psychiatry: Judgement and insight impaired. Mood & affect flat.     Data Reviewed:   I have personally reviewed following labs and imaging studies   CBC: Recent Labs  Lab 05/10/20 1945 05/11/20 0414 05/14/20 0310 05/15/20 0259 05/16/20 0313  WBC 10.8*   < > 8.8 7.0 7.9  NEUTROABS 7.8*  --   --   --   --   HGB 10.0*   < > 9.1* 8.5* 9.6*  HCT 35.5*   < > 29.9* 26.6* 29.1*  MCV 98.6   < > 93.7 90.8 87.7  PLT 530*   < > 443* 420* 414*   < > = values in this interval not displayed.    Basic Metabolic Panel: Recent Labs  Lab 05/12/20 0701 05/12/20 0701 05/14/20 0310 05/15/20 1634 05/17/20 0620  NA 140  --   --  137 138  K 5.4*  --   --  4.4 3.8  CL 98  --   --  89* 92*  CO2 33*  --   --  38* 38*  GLUCOSE 266*  --   --  246* 128*  BUN 26*  --   --  26* 20  CREATININE 1.92*   < > 1.60* 1.34* 1.28*  CALCIUM 7.9*  --   --  9.0 8.4*   < > = values in this interval not displayed.    Liver Function Tests: Recent Labs  Lab 05/10/20 1945 05/11/20 0414 05/15/20 1634  AST 20 15 27   ALT 21 19 32  ALKPHOS 104 94 93  BILITOT 1.1 1.5* 1.0  PROT 6.1* 6.0* 6.2*  ALBUMIN 2.8* 2.6* 3.0*    CBG: Recent Labs  Lab 05/16/20 2055 05/17/20 0733 05/17/20 1205  GLUCAP 97 137* 144*     Microbiology Studies:   Recent Results (from the past 240 hour(s))  Respiratory Panel by RT PCR (Flu A&B, Covid) - Nasopharyngeal Swab     Status: None   Collection Time: 05/10/20  7:58 PM   Specimen: Nasopharyngeal Swab  Result Value Ref Range Status   SARS Coronavirus 2 by RT PCR NEGATIVE NEGATIVE Final    Comment: (NOTE) SARS-CoV-2 target nucleic acids are NOT DETECTED.  The SARS-CoV-2 RNA is generally detectable in upper respiratoy specimens during the acute phase of infection. The lowest concentration of SARS-CoV-2 viral copies this assay can detect is 131 copies/mL. A negative result does not preclude SARS-Cov-2 infection and should not be used as the sole basis for treatment or other patient management decisions. A negative result may occur with  improper specimen collection/handling, submission of specimen other than nasopharyngeal swab, presence of viral mutation(s) within the areas targeted by this assay, and inadequate number of viral copies (<131 copies/mL). A negative result must be combined with clinical observations, patient history, and epidemiological information. The expected result is Negative.  Fact Sheet for Patients:  PinkCheek.be  Fact Sheet for Healthcare Providers:  GravelBags.it  This test is no t yet approved or cleared by the [place] FDA and  has been authorized for detection and/or diagnosis of SARS-CoV-2 by FDA under an Emergency Use Authorization (EUA). This EUA will remain  in effect (meaning this test can be used) for the duration of the COVID-19 declaration under Section 564(b)(1) of the Act, 21 U.S.C. section 360bbb-3(b)(1), unless the authorization is terminated or revoked sooner.     Influenza A by PCR NEGATIVE NEGATIVE Final   Influenza B by PCR NEGATIVE NEGATIVE Final    Comment: (NOTE) The Xpert Xpress SARS-CoV-2/FLU/RSV assay is intended as an aid in  the diagnosis  of influenza from Nasopharyngeal swab specimens and  should not be used as a sole basis for treatment. Nasal washings and  aspirates are unacceptable for Xpert Xpress SARS-CoV-2/FLU/RSV  testing.  Fact Sheet for Patients: PinkCheek.be  Fact Sheet for Healthcare Providers: GravelBags.it  This test is not yet approved or cleared by the [place] FDA and  has been authorized for detection and/or diagnosis of SARS-CoV-2 by  FDA under an Emergency Use Authorization (EUA). This EUA will remain  in effect (meaning this test can be used) for the duration of the  Covid-19 declaration under Section 564(b)(1) of the Act, 21  U.S.C. section 360bbb-3(b)(1), unless the authorization is  terminated or revoked. Performed at [facility], [place] [address] [place], [place] [address]   MRSA PCR Screening     Status: None   Collection Time: 05/11/20 12:04 AM   Specimen: Nasal Mucosa; Nasopharyngeal  Result Value Ref Range Status   MRSA by PCR NEGATIVE NEGATIVE Final    Comment:        The GeneXpert MRSA Assay (FDA approved for NASAL specimens only), is one component of a comprehensive MRSA colonization surveillance program. It is not intended to diagnose MRSA infection nor to guide or monitor treatment for MRSA infections. Performed at [facility], [place] [address], [place],  [address]   Respiratory Panel by RT PCR (Flu A&B, Covid) - Nasopharyngeal Swab     Status: None   Collection Time: 05/15/20  1:09 PM   Specimen: Nasopharyngeal Swab  Result Value Ref Range Status   SARS Coronavirus 2 by RT PCR NEGATIVE NEGATIVE Final    Comment: (NOTE) SARS-CoV-2 target nucleic acids are NOT DETECTED.  The SARS-CoV-2 RNA is generally detectable in upper respiratoy specimens during the acute phase of infection. The lowest concentration of SARS-CoV-2 viral copies this assay can detect is 131  copies/mL. A negative result does not preclude SARS-Cov-2 infection and should not be  used as the sole basis for treatment or other patient management decisions. A negative result may occur with  improper specimen collection/handling, submission of specimen other than nasopharyngeal swab, presence of viral mutation(s) within the areas targeted by this assay, and inadequate number of viral copies (<131 copies/mL). A negative result must be combined with clinical observations, patient history, and epidemiological information. The expected result is Negative.  Fact Sheet for Patients:  PinkCheek.be  Fact Sheet for Healthcare Providers:  GravelBags.it  This test is no t yet approved or cleared by the [place] FDA and  has been authorized for detection and/or diagnosis of SARS-CoV-2 by FDA under an Emergency Use Authorization (EUA). This EUA will remain  in effect (meaning this test can be used) for the duration of the COVID-19 declaration under Section 564(b)(1) of the Act, 21 U.S.C. section 360bbb-3(b)(1), unless the authorization is terminated or revoked sooner.     Influenza A by PCR NEGATIVE NEGATIVE Final   Influenza B by PCR NEGATIVE NEGATIVE Final    Comment: (NOTE) The Xpert Xpress SARS-CoV-2/FLU/RSV assay is intended as an aid in  the diagnosis of influenza from Nasopharyngeal swab specimens and  should not be used as a sole basis for treatment. Nasal washings and  aspirates are unacceptable for Xpert Xpress SARS-CoV-2/FLU/RSV  testing.  Fact Sheet for Patients: PinkCheek.be  Fact Sheet for Healthcare Providers: GravelBags.it  This test is not yet approved or cleared by the [place] FDA and  has been authorized for detection and/or diagnosis of SARS-CoV-2 by  FDA under an Emergency Use Authorization (EUA). This EUA will remain  in effect (meaning  this test can be used) for the duration of the  Covid-19 declaration under Section 564(b)(1) of the Act, 21  U.S.C. section 360bbb-3(b)(1), unless the authorization is  terminated or revoked. Performed at [facility], [place] [address] [place], [place] [address]      Radiology Studies:  DG CHEST PORT 1 VIEW  Result Date: 05/15/2020 CLINICAL DATA:  Altered mental status, acute respiratory failure, hypoxia EXAM: PORTABLE CHEST 1 VIEW COMPARISON:  05/10/2020 FINDINGS: Lung volumes are small and there is bibasilar atelectasis. No superimposed confluent pulmonary infiltrate. No pneumothorax or pleural effusion. Cardiac size within normal limits. Pulmonary vascularity is normal. No acute bone abnormality. IMPRESSION: Pulmonary hypoinflation. Electronically Signed   By: Fidela Salisbury MD   On: 05/15/2020 15:43   ECHOCARDIOGRAM LIMITED  Result Date: 05/16/2020    ECHOCARDIOGRAM LIMITED REPORT   Patient Name:   SEDRICK TOBER Date of Exam: 05/16/2020 Medical Rec #:  [id number]     Height:       72.0 in Accession #:    [id number]    Weight:       207.9 lb Date of Birth:  1945-12-03      BSA:          2.166 m Patient Age:    72 years      BP:           125/85 mmHg Patient Gender: M             HR:           106 bpm. Exam Location:  Inpatient Procedure: Limited Echo, Intracardiac Opacification Agent, Cardiac Doppler and            Color Doppler Indications:    R06.02 SOB  History:        Patient has prior history of Echocardiogram examinations, most  recent 04/29/2020. CHF, Signs/Symptoms:Hypotension and Dyspnea;                 Risk Factors:Diabetes, Dyslipidemia and Hypertension.  Sonographer:    Roseanna Rainbow RDCS Referring Phys: [id number] [facility]  Sonographer Comments: Technically difficult study due to poor echo windows. Image acquisition challenging due to respiratory motion. IMPRESSIONS  1. Left ventricular ejection fraction, by estimation, is 60 to 65%. The left ventricle  has normal function. The left ventricle has no regional wall motion abnormalities.  2. McConnell's Sign noted in RV apex. PE on differential. Right ventricular systolic function is moderately reduced. The right ventricular size is mildly enlarged.  3. The mitral valve is grossly normal. No evidence of mitral valve regurgitation.  4. The aortic valve is calcified. Aortic valve regurgitation is not visualized. Comparison(s): A prior study was performed on 04/29/20. Likely similar LV and RV function from 04/29/20 study. FINDINGS  Left Ventricle: Left ventricular ejection fraction, by estimation, is 60 to 65%. The left ventricle has normal function. The left ventricle has no regional wall motion abnormalities. Definity contrast agent was given IV to delineate the left ventricular  endocardial borders. There is no left ventricular hypertrophy of the basal-septal segment. Right Ventricle: McConnell's Sign noted in RV apex. PE on differential. The right ventricular size is mildly enlarged. Right ventricular systolic function is moderately reduced. Left Atrium: Left atrial size was normal in size. Right Atrium: Right atrial size was normal in size. Pericardium: There is no evidence of pericardial effusion. Presence of pericardial fat pad. Mitral Valve: The mitral valve is grossly normal. Tricuspid Valve: The tricuspid valve is grossly normal. Tricuspid valve regurgitation is not demonstrated. Aortic Valve: The aortic valve is calcified. Aortic valve regurgitation is not visualized. LEFT VENTRICLE PLAX 2D LVIDd:         3.40 cm LVIDs:         2.40 cm LV PW:         1.40 cm LV IVS:        1.90 cm  LV Volumes (MOD) LV vol d, MOD A4C: 56.3 ml LV vol s, MOD A4C: 21.4 ml LV SV MOD A4C:     56.3 ml LEFT ATRIUM         Index LA diam:    2.80 cm 1.29 cm/m   AORTA Ao Root diam: 3.40 cm Rudean Haskell MD Electronically signed by Rudean Haskell MD Signature Date/Time: 05/16/2020/3:39:25 PM    Final      Scheduled Meds:    . apixaban  5 mg Oral BID  . atorvastatin  10 mg Oral Daily  . Chlorhexidine Gluconate Cloth  6 each Topical Daily  . cholecalciferol  1,000 Units Oral Daily  . diltiazem  60 mg Oral Q8H  . feeding supplement  237 mL Oral BID BM  . furosemide  20 mg Oral Daily  . insulin aspart  0-5 Units Subcutaneous QHS  . insulin aspart  0-9 Units Subcutaneous TID WC  . insulin glargine  10 Units Subcutaneous Daily  . mouth rinse  15 mL Mouth Rinse BID  . multivitamin  15 mL Oral Daily  . pantoprazole  40 mg Oral BID    Continuous Infusions:     LOS: 7 days     Vernell Leep, MD, [place], [facility]. [facility]    To contact the attending provider between 7A-7P or the covering provider during after hours 7P-7A, please log into the web site [email or web address] and access using universal [place] password  for that web site. If you do not have the password, please call the hospital operator.  05/17/2020, 2:03 PM

## 2020-05-17 NOTE — Progress Notes (Signed)
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  recently DC back to [facility] after hodpitalization for acute hypoxic hypercapneic respiratory failure.  Admitted to stepdown unit for acute on chronic hypercarbic and hypoxic respiratory failure with associated acute metabolic encephalopathy.  REview of RLE HDQ:[id number] lower extremity venous Doppler results from 10/2: Had acute DVT involving the right common femoral vein and right posterior tibial vein.  Thrombus in the right common femoral vein was mobile.   OT comments  Patient progressing and showed improved ability to perform grooming and UB bathing and hygiene while sitting EOB, but with one posterior LOB and need of moderate assist for bed mobility and Min As at EOB, compared to previous session. Patient limited by generalized weakness, impaired cognition which may be baseline, RLE pain, along with deficits noted below. Pt continues to demonstrate good rehab potential and would benefit from continued skilled OT to increase safety and independence with ADLs and functional transfers to allow pt to return home safely and reduce caregiver burden and fall risk.   Follow Up Recommendations  SNF;Supervision/Assistance - 24 hour    Equipment Recommendations       Recommendations for Other Services      Precautions / Restrictions Precautions Precautions: Fall Precaution Comments: R quads poorly activating, will buckle quickly, needs stedy or maxi lift, monitor HR goes into 190's Restrictions Weight Bearing Restrictions: No        Mobility Bed Mobility Overal bed mobility: Needs Assistance Bed Mobility: Supine to Sit;Sit to Supine Rolling: Mod assist;+2 for physical assistance;+2 for safety/equipment   Supine to sit: Mod assist;HOB elevated Sit to supine: Mod assist;HOB elevated      Transfers                      Balance Overall balance assessment: Needs assistance Sitting-balance support: Feet supported;No upper extremity supported Sitting balance-Leahy Scale: Poor Sitting balance - Comments: Posterior LOB Postural control: Posterior lean     Standing balance comment: NT                           ADL either performed or assessed with clinical judgement   ADL       Grooming: Set up;Sitting;Minimal assistance;Cueing for sequencing Grooming Details (indicate cue type and reason): Pt sat EOB and performed face washing with cues to initiate, "They usually do it for me" and Min guard for balance, and performed oral care with Min guard for task and Min As for balance. Pt with one posterior LOB in bed with Moderate assist needed to regain upright sitting and increased HR to 190s per RN to room. Pt reporrts that he slid out of bed at home and was on floor all night, and now very fearful of falling.  Pt reassured and encouraged. Upper Body Bathing: Minimal assistance;Sitting;Set up;Cueing for safety;Cueing for sequencing   Lower Body Bathing: Bed level;Total assistance Lower Body Bathing Details (indicate cue type and reason): Pt was incontinent of bowel while EOB. Pt returned to supine and required total assist for hygiene.  Toileting- Clothing Manipulation and Hygiene: Total assistance;Bed level;Cueing for sequencing       Functional mobility during ADLs: Minimal assistance;Moderate assistance General ADL Comments: moderate assist with bed mobility and Min As to maintain sitting balance at EOB.     Vision       Perception     Praxis      Cognition  Arousal/Alertness: Awake/alert Behavior During Therapy: Flat affect Overall Cognitive Status: No family/caregiver present to determine baseline cognitive functioning Area of Impairment: Following commands;Attention;Awareness;Problem solving;Safety/judgement                   Current Attention Level: Focused Memory: Decreased short-term memory Following Commands: Follows one step commands inconsistently;Follows one step commands with increased time Safety/Judgement: Decreased awareness of safety Awareness: Intellectual Problem Solving: Slow processing;Decreased initiation          Exercises Shoulder Exercises Elbow Flexion: AROM;Seated;10 reps Elbow Extension: AROM;Seated;10 reps   Shoulder Instructions       General Comments      Pertinent Vitals/ Pain       Pain Assessment: 0-10 Pain Score: 7  Pain Location: RT lower leg Pain Descriptors / Indicators: Sore Pain Intervention(s): Limited activity within patient's tolerance;Monitored during session  Home Living                                          Prior Functioning/Environment              Frequency  Min 2X/week        Progress Toward Goals  OT Goals(current goals can now be found in the care plan section)  Progress towards OT goals: Progressing toward goals  Acute Rehab OT Goals Patient Stated Goal: to get stronger OT Goal Formulation: With patient Time For Goal Achievement: 05/26/20 Potential to Achieve Goals: Good  Plan Discharge plan remains appropriate    Co-evaluation                 AM-PAC OT "6 Clicks" Daily Activity     Outcome Measure   Help from another person eating meals?: A Little Help from another person taking care of personal grooming?: A Little Help from another person toileting, which includes using toliet, bedpan, or urinal?: Total Help from another person bathing (including washing, rinsing, drying)?: A Lot Help from another person to put on  and taking off regular upper body clothing?: A Little Help from another person to put on and taking off regular lower body clothing?: Total 6 Click Score: 13    End of Session    OT Visit Diagnosis: Unsteadiness on feet (R26.81);Other abnormalities of gait and mobility (R26.89);Muscle weakness (generalized) (M62.81);Pain Pain - Right/Left: Right Pain - part of body: Knee   Activity Tolerance Patient tolerated treatment well   Patient Left with call bell/phone within reach;with chair alarm set;with nursing/sitter in room;in bed   Nurse Communication Mobility status        Time: 6578-4696 OT Time Calculation (min): 31 min  Charges: OT General Charges $OT Visit: 1 Visit OT Treatments $Self Care/Home Management : 8-22 mins $Therapeutic Activity: 8-22 mins  Anderson Malta, OT Acute Rehab Services Office: [phone] 05/17/2020    Julien Girt 05/17/2020, 1:20 PM

## 2020-05-17 NOTE — Progress Notes (Signed)
Patient continues to refuse nocturnal BiPAP 

## 2020-05-18 DIAGNOSIS — G9341 Metabolic encephalopathy: Secondary | ICD-10-CM | POA: Diagnosis not present

## 2020-05-18 DIAGNOSIS — J9622 Acute and chronic respiratory failure with hypercapnia: Secondary | ICD-10-CM | POA: Diagnosis not present

## 2020-05-18 DIAGNOSIS — J9621 Acute and chronic respiratory failure with hypoxia: Secondary | ICD-10-CM

## 2020-05-18 LAB — BASIC METABOLIC PANEL
Anion gap: 9 (ref 5–15)
BUN: 19 mg/dL (ref 8–23)
CO2: 38 mmol/L — ABNORMAL HIGH (ref 22–32)
Calcium: 8.4 mg/dL — ABNORMAL LOW (ref 8.9–10.3)
Chloride: 93 mmol/L — ABNORMAL LOW (ref 98–111)
Creatinine, Ser: 1.31 mg/dL — ABNORMAL HIGH (ref 0.61–1.24)
GFR, Estimated: 54 mL/min — ABNORMAL LOW (ref >60)
Glucose, Bld: 125 mg/dL — ABNORMAL HIGH (ref 70–99)
Potassium: 3.1 mmol/L — ABNORMAL LOW (ref 3.5–5.1)
Sodium: 140 mmol/L (ref 135–145)

## 2020-05-18 LAB — RESPIRATORY PANEL BY RT PCR (FLU A&B, COVID)
Influenza A by PCR: NEGATIVE
Influenza B by PCR: NEGATIVE
SARS Coronavirus 2 by RT PCR: NEGATIVE

## 2020-05-18 LAB — GLUCOSE, CAPILLARY
Glucose-Capillary: 156 mg/dL — ABNORMAL HIGH (ref 70–99)
Glucose-Capillary: 282 mg/dL — ABNORMAL HIGH (ref 70–99)
Glucose-Capillary: 206 mg/dL — ABNORMAL HIGH (ref 70–99)

## 2020-05-18 LAB — MAGNESIUM: Magnesium: 1.2 mg/dL — ABNORMAL LOW (ref 1.7–2.4)

## 2020-05-18 MED ORDER — ENSURE ENLIVE PO LIQD: 237.0000 mL | Freq: Two times a day (BID) | ORAL | Status: AC

## 2020-05-18 MED ORDER — LEVALBUTEROL HCL 1.25 MG/0.5ML IN NEBU: 0.6300 mg | INHALATION_SOLUTION | Freq: Four times a day (QID) | RESPIRATORY_TRACT | Status: AC | PRN

## 2020-05-18 MED ORDER — POTASSIUM CHLORIDE CRYS ER 20 MEQ PO TBCR
30.0000 meq | EXTENDED_RELEASE_TABLET | ORAL | Status: AC
  Administered 2020-05-18 (×2): 30 meq via ORAL
  Filled 2020-05-18 (×2): qty 1

## 2020-05-18 MED ORDER — MAGNESIUM SULFATE 4 GM/100ML IV SOLN
4.0000 g | Freq: Once | INTRAVENOUS | Status: AC
  Administered 2020-05-18: 4 g via INTRAVENOUS
  Filled 2020-05-18: qty 100

## 2020-05-18 MED ORDER — INSULIN GLARGINE 100 UNIT/ML ~~LOC~~ SOLN: 10.0000 [IU] | Freq: Every day | SUBCUTANEOUS | Status: DC

## 2020-05-18 MED ORDER — ACETAMINOPHEN 325 MG PO TABS: 650.0000 mg | ORAL_TABLET | Freq: Four times a day (QID) | ORAL | Status: AC | PRN

## 2020-05-18 MED ORDER — RESOURCE THICKENUP CLEAR PO POWD
ORAL | Status: DC
Start: 2020-05-18 — End: 2020-07-22

## 2020-05-18 MED ORDER — DILTIAZEM HCL 60 MG PO TABS: 60.0000 mg | ORAL_TABLET | Freq: Three times a day (TID) | ORAL | Status: AC

## 2020-05-18 NOTE — Discharge Instructions (Signed)

## 2020-05-18 NOTE — TOC Transition Note (Addendum)
Transition of Care [facility]) - CM/SW Discharge Note   Patient Details  Name: Jay Holloway MRN: [id number] Date of Birth: 04/02/1946  Transition of [facility] CM/SW Contact:  Dessa Phi, RN Phone Number: 05/18/2020, 12:11 PM   Clinical Narrative:Returning back to [facility]-rm[id number],tel# for nsg report [phone].Bipap already @ SNF, Home vent order placed for d/c to home from SNF.[facility] called as a will call-nsg to call once covid results in. No further CM needs.  3p-Nsg fax covid results to [facility] [phone].       Barriers to Discharge: No Barriers Identified   Patient Goals and CMS Choice Patient states their goals for this hospitalization and ongoing recovery are:: unable to state      Discharge Placement              Patient chooses bed at: [facility] Patient to be transferred to facility by: [place] Name of family member notified: Oneita Kras) [phone] Patient and family notified of of transfer: 05/18/20  Discharge Plan and Services   Discharge Planning Services: CM Consult                                 Social Determinants of Health (SDOH) Interventions     Readmission Risk Interventions No flowsheet data found.

## 2020-05-18 NOTE — Progress Notes (Signed)
Report called to [facility], magnesium IV currently infusing will call [facility] to transport when infusion complete. Will continue to monitor.

## 2020-05-18 NOTE — Discharge Summary (Signed)
Physician Discharge Summary  Jay Holloway TKW:[id number] DOB: Apr 07, 1946  PCP: Jay Gravel, MD  Admitted from: SNF Discharged to: SNF  Admit date: 05/10/2020 Discharge date: 05/18/2020  Recommendations for Outpatient Follow-up:    Follow-up Information    MD at SNF. Schedule an appointment as soon as possible for a visit.   Why: To be seen in 2 to 3 days with repeat labs (CBC, BMP & Mg).  Recommend outpatient Pulmonology follow-up and Palliative Care consultation for goals of care.       Jay Gravel, MD. Schedule an appointment as soon as possible for a visit.   Specialty: Internal Medicine Why: Upon discharge from SNF. Contact information: [place] [id number] [phone]        Jay Garfinkel, MD. Schedule an appointment as soon as possible for a visit.   Specialty: Pulmonary Disease Why: Follow-up regarding OSA/COPD evaluation and management. Contact information: [place] [phone]                Home Health: N/A    Equipment/Devices: BiPAP at SNF to be used every night and as needed during the daytime while taking a nap.  Upon DC from SNF to home, trilogy vent.    Durable Medical Equipment  (From admission, onward)         Start     Ordered   05/12/20 1558  For home use only DME Ventilator  Once       Comments: Patient need trilogy vent for hypercarbic respiratory failure  Question:  Length of Need  Answer:  Lifetime   05/12/20 1558           Discharge Condition: Improved and stable.  However, he is at high risk for recurrent decline and thereby overall long-term prognosis is guarded.   Code Status: DNR Diet recommendation:  Discharge Diet Orders (From admission, onward)    Start     Ordered   05/18/20 0000  Diet - low sodium heart healthy       Comments: Diet consistency: Dysphagia 3 diet and thin liquids.   05/18/20 1201   05/18/20 0000  Diet Carb Modified         05/18/20 1201           Discharge Diagnoses:  Principal Problem:   Acute respiratory failure with hypoxia and hypercapnia (HCC) Active Problems:   Type 2 diabetes mellitus with hyperglycemia, with long-term current use of insulin (HCC)   Prostate cancer (HCC)   Acute metabolic encephalopathy   CKD (chronic kidney disease), stage II   Pressure injury of sacral region, stage 2 (HCC)   Acute respiratory failure (HCC)   Brief Summary: 74 year old male with PMH of DM2, HTN, prostate cancer, recent hospitalization for hypercarbic respiratory failure possibly from undiagnosed COPD/sleep apnea, prior to that also admitted for DKA, recently diagnosed with acute DVT/PE and on apixaban, sent to the ED from SNF where he was found to be lethargic.  Admitted to stepdown unit for acute on chronic hypercarbic and hypoxic respiratory failure with associated acute metabolic encephalopathy.  Improved with BiPAP.  Pulmonology consulted and signed off.  Course complicated by hypotension (resolved) and tachycardia.   Assessment & Plan:   Acute on chronic respiratory failure with hypoxia and hypercarbia/possible COPD exacerbation:  Appears to be a recurrent presentation.  VBG on admission: pH 7.081, PCO2 >120, PO2 42.5, HCO3 37.4 and oxygen saturation 57.4.  Treated with BiPAP with improvement.  However patient inconsistent with BiPAP use at night despite counseling.  Continue BiPAP at night and as needed during daytime when napping.  Does not appear to need oxygen in the daytime, saturating in the high 90s-100s on room air. Suspect chronic CO2 retainer.  UDS negative. Ammonia level normal.  Pulmonology consultation appreciated. They suspect that he has COPD and undiagnosed OSA and recommended: Steroids, bronchodilators, PFT and sleep study as outpatient although they are not sure if he will get to that point for follow-up in the clinic and be able to cooperate for the test, noninvasive ventilation  and would be a good candidate for trilogy vent.  BIPAP settings 14/6, RR 12 and 28%  Outpatient pulmonology follow-up.   Patient reportedly has been refusing BiPAP at night.  I discussed in detail with patient who indicates that he does not remember being offered BiPAP at night and states that he is willing to use it.  Counseled extensively regarding importance of compliance with nightly BiPAP otherwise his condition could rapidly worsen and could even lead to death.  He verbalized understanding but not sure if he will consistently follow through.  I discussed with TOC team and they indicated that patient will receive BiPAP at SNF that has been arranged followed by trilogy vent upon discharge from SNF to home.  Clinically improved and stable but remains at high risk for recurrent decompensation due to inconsistent use of nightly BiPAP.  Recommend palliative care consultation and follow-up at SNF to address goals of care  Acute metabolic encephalopathy:  Secondary to hypercarbic and hypoxic respiratory failure.  Resolved.  Mental status likely at baseline.  Avoid sedatives and narcotics as much as possible.  COPD exacerbation:  Resolved.  Steroids rapidly tapered due to issues with confusion.  As needed bronchodilators.  Outpatient follow-up with pulmonology to assess PFTs and further management.  Possible OSA  Management as outlined above.  Outpatient follow-up with pulmonology to further assess including sleep study etc.  Acute kidney injury:  Creatinine had peaked to 1.92. Likely related to hypotension/possible ATN on 10/14.  Resolved.  Creatinine stable in the 1.2-1.3 range.  Follow BMP periodically.  Acute urinary retention:  Occurred early on in hospitalization.    Resolved.  Mild hyperkalemia/hypokalemia  Hyperkalemia of unclear etiology.  Resolved.  Today potassium 3.1, replaced prior to discharge.  Continue prior home dose of low-dose potassium  supplements while he is on Lasix.  Monitor BMP closely at SNF.  Hypomagnesemia Magnesium 1.2.  Replaced IV today prior to discharge.  Follow magnesium at SNF.  Diarrhea:  May have been related to Glucerna  Resolved.  Glucerna has been switched to Ensure.  Type II DM:  Treated in the hospital with SSI and monitor CBGs closely.   Recent admission for DKA.  Consulted diabetes coordinator.  Resumed low-dose Lantus.  Glucerna was likely causing diarrhea, stopped and now on Ensure but not sure if he will even drink this-refusing.  Good inpatient control as noted below.  Resume prior home dose of Lantus, Metformin and Ozempic at DC.  Monitor CBGs closely at SNF and adjust medications as needed  RecentRLEDVT/PE:  Due to AMS on admission, was placed on IV heparin dripand now transitioned back to prior Eliquis.  TTE 05/16/2020: LVEF 60-65%.  McConnell sign noted in RV apex.  Right ventricular systolic function is moderately reduced.  Right ventricular size is mildly enlarged.  On 10/20, patient noted to have sinus tachycardia and occasional episode of nonsustained SVT.  He is not clinically  volume overloaded.  This tachycardia may be related to recent PE.    Bolused with normal saline 500 mL yesterday.  Tachycardia significantly improved/resolved.  Continue Eliquis.  Sinus tachycardia/nonsustained SVT  Couple of days back, patient was started on Cardizem 60 mg every 8 hours for this. TSH wnl.    TTE shows normal LVEF.  However given recent PE, bolused with normal saline.  Continue Cardizem for now which he seems to be tolerating.  Tachycardia significantly improved or even resolved.  Patient in sinus rhythm in the 90s and occasionally in the 100s.  No further episodes of nonsustained SVT.  I discussed in detail with Pulmonologist today, reviewed patient's case including recent TTE results and they advised that since patient is tolerating current dose of Cardizem, okay to  continue.  However if patient develops issues with hypotension, may need to wean off or even stop Cardizem and could consider beta-blockers at that time.  May consider transitioning to long-acting Cardizem after a couple of days if patient has no issues with hypotension.  Essential hypertension/hypotension:  Hypotension resolved.    Controlled.  Anemia of chronic disease  Hemoglobin stable in the 9 g range mostly.  Chronic pain:  Reports longstanding history of bilateral lower extremity pain. No localizing pain reports diffuse pain. Indicates that he has been on oxycodone for a long time.  Currently appears to be controlled on Tylenol alone.  Avoid narcotics due to recurrent mental status changes.  Dysphagia:  Continue modified diet as prior.  Adult failure to thrive  Multifactorial due to significant comorbidities.  PT and OT evaluationrecommend SNF.  Pressure Injury 05/10/20 Coccyx Right Stage 2 - Partial thickness loss of dermis presenting as a shallow open injury with a red, pink wound bed without slough. (Active)  05/10/20 2300  Location: Coccyx  Location Orientation: Right  Staging: Stage 2 - Partial thickness loss of dermis presenting as a shallow open injury with a red, pink wound bed without slough.  Wound Description (Comments):   Present on Admission: Yes    Pressure Injury 05/10/20 Coccyx Left Stage 2 - Partial thickness loss of dermis presenting as a shallow open injury with a red, pink wound bed without slough. (Active)  05/10/20 2300  Location: Coccyx  Location Orientation: Left  Staging: Stage 2 - Partial thickness loss of dermis presenting as a shallow open injury with a red, pink wound bed without slough.  Wound Description (Comments):   Present on Admission:     Pressure Injury 05/10/20 Heel Right Deep Tissue Pressure Injury - Purple or maroon localized area of discolored intact skin or blood-filled blister due to damage of underlying soft  tissue from pressure and/or shear. (Active)  05/10/20 2300  Location: Heel  Location Orientation: Right  Staging: Deep Tissue Pressure Injury - Purple or maroon localized area of discolored intact skin or blood-filled blister due to damage of underlying soft tissue from pressure and/or shear.  Wound Description (Comments):   Present on Admission:     Body mass index is 28.2 kg/m.  Nutritional Status Nutrition Problem: Increased nutrient needs Etiology: wound healing Signs/Symptoms: estimated needs Interventions: Ensure Enlive (each supplement provides 350kcal and 20 grams of protein), MVI, Magic cup      Consultants:   Pulmonology  Procedures:   BiPAP   Discharge Instructions  Discharge Instructions    (HEART FAILURE PATIENTS) Call MD:  Anytime you have any of the following symptoms: 1) 3 pound weight gain in 24 hours or 5 pounds in 1  week 2) shortness of breath, with or without a dry hacking cough 3) swelling in the hands, feet or stomach 4) if you have to sleep on extra pillows at night in order to breathe.   Complete by: As directed    Call MD for:   Complete by: As directed    Recurrent confusion or altered mental status.   Call MD for:  difficulty breathing, headache or visual disturbances   Complete by: As directed    Call MD for:  extreme fatigue   Complete by: As directed    Call MD for:  persistant dizziness or light-headedness   Complete by: As directed    Call MD for:  persistant nausea and vomiting   Complete by: As directed    Call MD for:  redness, tenderness, or signs of infection (pain, swelling, redness, odor or green/yellow discharge around incision site)   Complete by: As directed    Call MD for:  severe uncontrolled pain   Complete by: As directed    Call MD for:  temperature >100.4   Complete by: As directed    Diet - low sodium heart healthy   Complete by: As directed    Diet consistency: Dysphagia 3 diet and thin liquids.   Diet  Carb Modified   Complete by: As directed    Discharge instructions   Complete by: As directed    BiPAP every night and as needed during daytime while taking a nap.   Increase activity slowly   Complete by: As directed    No wound care   Complete by: As directed        Medication List    STOP taking these medications   HYDROcodone-acetaminophen 7.5-325 MG tablet Commonly known as: NORCO   melatonin 3 MG Tabs tablet   traMADol 50 MG tablet Commonly known as: ULTRAM     TAKE these medications   acetaminophen 325 MG tablet Commonly known as: TYLENOL Take 2 tablets (650 mg total) by mouth every 6 (six) hours as needed for mild pain, moderate pain or headache.   apixaban 5 MG Tabs tablet Commonly known as: ELIQUIS Take 1 tablet (5 mg total) by mouth 2 (two) times daily. What changed: Another medication with the same name was removed. Continue taking this medication, and follow the directions you see here.   atorvastatin 10 MG tablet Commonly known as: LIPITOR Take 10 mg by mouth daily.   diltiazem 60 MG tablet Commonly known as: CARDIZEM Take 1 tablet (60 mg total) by mouth 3 (three) times daily.   feeding supplement Liqd Take 237 mLs by mouth 2 (two) times daily between meals. What changed: when to take this   furosemide 40 MG tablet Commonly known as: LASIX Take 0.5 tablets (20 mg total) by mouth daily. fluid   insulin glargine 100 UNIT/ML injection Commonly known as: LANTUS Inject 0.1 mLs (10 Units total) into the skin daily. Start taking on: May 19, 2020 What changed: additional instructions   levalbuterol 1.25 MG/0.5ML nebulizer solution Commonly known as: XOPENEX Take 0.63 mg by nebulization every 6 (six) hours as needed for wheezing or shortness of breath.   metFORMIN 500 MG 24 hr tablet Commonly known as: GLUCOPHAGE-XR Take 500 mg by mouth at bedtime.   Multivitamin Adults Tabs Take 1 tablet by mouth daily.   ondansetron 4 MG tablet Commonly  known as: ZOFRAN Take 4 mg by mouth every 8 (eight) hours as needed for nausea or vomiting.   OZEMPIC (1  MG/DOSE) Jobos Inject 1 mg into the skin once a week. Wed   pantoprazole 40 MG tablet Commonly known as: PROTONIX Take 1 tablet (40 mg total) by mouth 2 (two) times daily.   Potassium Chloride ER 20 MEQ Tbcr Take 20 mEq by mouth daily.   Resource ThickenUp Clear Powd Oral, As needed   Vitamin D3 25 MCG (1000 UT) Caps Take 1,000 Units by mouth daily.      No Known Allergies    Procedures/Studies:  DG CHEST PORT 1 VIEW  Result Date: 05/15/2020 CLINICAL DATA:  Altered mental status, acute respiratory failure, hypoxia EXAM: PORTABLE CHEST 1 VIEW COMPARISON:  05/10/2020 FINDINGS: Lung volumes are small and there is bibasilar atelectasis. No superimposed confluent pulmonary infiltrate. No pneumothorax or pleural effusion. Cardiac size within normal limits. Pulmonary vascularity is normal. No acute bone abnormality. IMPRESSION: Pulmonary hypoinflation. Electronically Signed   By: Fidela Salisbury MD   On: 05/15/2020 15:43   DG Chest Port 1 View  Result Date: 05/10/2020 CLINICAL DATA:  Shortness of breath EXAM: PORTABLE CHEST 1 VIEW COMPARISON:  04/28/2020 FINDINGS: Cardiac shadow is stable. Lungs are well aerated bilaterally. Bibasilar atelectatic changes are seen. No sizable effusion is noted. No bony abnormality is seen. IMPRESSION: Mild bibasilar atelectatic changes. Electronically Signed   By: Inez Catalina M.D.   On: 05/10/2020 20:21      ECHOCARDIOGRAM LIMITED  Result Date: 05/16/2020    ECHOCARDIOGRAM LIMITED REPORT   Patient Name:   Jay Holloway Date of Exam: 05/16/2020 Medical Rec #:  [id number]     Height:       72.0 in Accession #:    [id number]    Weight:       207.9 lb Date of Birth:  26-Dec-1945      BSA:          2.166 m Patient Age:    64 years      BP:           125/85 mmHg Patient Gender: M             HR:           106 bpm. Exam Location:  Inpatient Procedure: Limited  Echo, Intracardiac Opacification Agent, Cardiac Doppler and            Color Doppler Indications:    R06.02 SOB  History:        Patient has prior history of Echocardiogram examinations, most                 recent 04/29/2020. CHF, Signs/Symptoms:Hypotension and Dyspnea;                 Risk Factors:Diabetes, Dyslipidemia and Hypertension.  Sonographer:    Roseanna Rainbow RDCS Referring Phys: [id number] [facility]  Sonographer Comments: Technically difficult study due to poor echo windows. Image acquisition challenging due to respiratory motion. IMPRESSIONS  1. Left ventricular ejection fraction, by estimation, is 60 to 65%. The left ventricle has normal function. The left ventricle has no regional wall motion abnormalities.  2. McConnell's Sign noted in RV apex. PE on differential. Right ventricular systolic function is moderately reduced. The right ventricular size is mildly enlarged.  3. The mitral valve is grossly normal. No evidence of mitral valve regurgitation.  4. The aortic valve is calcified. Aortic valve regurgitation is not visualized. Comparison(s): A prior study was performed on 04/29/20. Likely similar LV and RV function from 04/29/20 study. FINDINGS  Left Ventricle: Left ventricular ejection fraction,  by estimation, is 60 to 65%. The left ventricle has normal function. The left ventricle has no regional wall motion abnormalities. Definity contrast agent was given IV to delineate the left ventricular  endocardial borders. There is no left ventricular hypertrophy of the basal-septal segment. Right Ventricle: McConnell's Sign noted in RV apex. PE on differential. The right ventricular size is mildly enlarged. Right ventricular systolic function is moderately reduced. Left Atrium: Left atrial size was normal in size. Right Atrium: Right atrial size was normal in size. Pericardium: There is no evidence of pericardial effusion. Presence of pericardial fat pad. Mitral Valve: The mitral valve is grossly normal.  Tricuspid Valve: The tricuspid valve is grossly normal. Tricuspid valve regurgitation is not demonstrated. Aortic Valve: The aortic valve is calcified. Aortic valve regurgitation is not visualized. LEFT VENTRICLE PLAX 2D LVIDd:         3.40 cm LVIDs:         2.40 cm LV PW:         1.40 cm LV IVS:        1.90 cm  LV Volumes (MOD) LV vol d, MOD A4C: 56.3 ml LV vol s, MOD A4C: 21.4 ml LV SV MOD A4C:     56.3 ml LEFT ATRIUM         Index LA diam:    2.80 cm 1.29 cm/m   AORTA Ao Root diam: 3.40 cm Rudean Haskell MD Electronically signed by Rudean Haskell MD Signature Date/Time: 05/16/2020/3:39:25 PM    Final       Subjective: Denies complaints.  Specifically denies dyspnea or pain.  States that he does not recollect refusing BiPAP and indicates that he will use it when offered.  Discharge Exam:  Vitals:   05/17/20 2056 05/18/20 0200 05/18/20 0500 05/18/20 1100  BP: 100/87 110/70 120/80 107/75  Pulse: 100 (!) 102 99 98  Resp: 20 17 18 19   Temp: 98.1 F (36.7 C) 97.9 F (36.6 C) 98 F (36.7 C) 97.7 F (36.5 C)  TempSrc: Oral Oral Oral Oral  SpO2: 97%  100% 99%  Weight:      Height:        General exam: Elderly male, moderately built and nourished sitting up comfortably in bed without distress. Respiratory system: Distant sounding breath sounds but clear to auscultation without wheezing, rhonchi or crackles. Respiratory effort normal. Cardiovascular system: S1 & S2 heard, RRR. No JVD, murmurs, rubs, gallops or clicks. No pedal edema.    Telemetry personally reviewed: Sinus rhythm with BBB morphology.  Much improved compared to yesterday.  Only occasional mild ST in the low 100s.  No further episodes of SVT noted. Gastrointestinal system: Abdomen is nondistended, soft and nontender. No organomegaly or masses felt. Normal bowel sounds heard. Central nervous system: Alert and oriented to self, place, partly to time. No focal neurological deficits. Extremities: Symmetric 5 x 5  power. Skin: No rashes, lesions or ulcers Psychiatry: Judgement and insight impaired. Mood & affect flat.    The results of significant diagnostics from this hospitalization (including imaging, microbiology, ancillary and laboratory) are listed below for reference.     Microbiology: Recent Results (from the past 240 hour(s))  Respiratory Panel by RT PCR (Flu A&B, Covid) - Nasopharyngeal Swab     Status: None   Collection Time: 05/10/20  7:58 PM   Specimen: Nasopharyngeal Swab  Result Value Ref Range Status   SARS Coronavirus 2 by RT PCR NEGATIVE NEGATIVE Final    Comment: (NOTE) SARS-CoV-2 target nucleic acids  are NOT DETECTED.  The SARS-CoV-2 RNA is generally detectable in upper respiratoy specimens during the acute phase of infection. The lowest concentration of SARS-CoV-2 viral copies this assay can detect is 131 copies/mL. A negative result does not preclude SARS-Cov-2 infection and should not be used as the sole basis for treatment or other patient management decisions. A negative result may occur with  improper specimen collection/handling, submission of specimen other than nasopharyngeal swab, presence of viral mutation(s) within the areas targeted by this assay, and inadequate number of viral copies (<131 copies/mL). A negative result must be combined with clinical observations, patient history, and epidemiological information. The expected result is Negative.  Fact Sheet for Patients:  PinkCheek.be  Fact Sheet for Healthcare Providers:  GravelBags.it  This test is no t yet approved or cleared by the [place] FDA and  has been authorized for detection and/or diagnosis of SARS-CoV-2 by FDA under an Emergency Use Authorization (EUA). This EUA will remain  in effect (meaning this test can be used) for the duration of the COVID-19 declaration under Section 564(b)(1) of the Act, 21 U.S.C. section  360bbb-3(b)(1), unless the authorization is terminated or revoked sooner.     Influenza A by PCR NEGATIVE NEGATIVE Final   Influenza B by PCR NEGATIVE NEGATIVE Final    Comment: (NOTE) The Xpert Xpress SARS-CoV-2/FLU/RSV assay is intended as an aid in  the diagnosis of influenza from Nasopharyngeal swab specimens and  should not be used as a sole basis for treatment. Nasal washings and  aspirates are unacceptable for Xpert Xpress SARS-CoV-2/FLU/RSV  testing.  Fact Sheet for Patients: PinkCheek.be  Fact Sheet for Healthcare Providers: GravelBags.it  This test is not yet approved or cleared by the [place] FDA and  has been authorized for detection and/or diagnosis of SARS-CoV-2 by  FDA under an Emergency Use Authorization (EUA). This EUA will remain  in effect (meaning this test can be used) for the duration of the  Covid-19 declaration under Section 564(b)(1) of the Act, 21  U.S.C. section 360bbb-3(b)(1), unless the authorization is  terminated or revoked. Performed at [facility], [place] [address], [place], [place] [address]   MRSA PCR Screening     Status: None   Collection Time: 05/11/20 12:04 AM   Specimen: Nasal Mucosa; Nasopharyngeal  Result Value Ref Range Status   MRSA by PCR NEGATIVE NEGATIVE Final    Comment:        The GeneXpert MRSA Assay (FDA approved for NASAL specimens only), is one component of a comprehensive MRSA colonization surveillance program. It is not intended to diagnose MRSA infection nor to guide or monitor treatment for MRSA infections. Performed at [facility] [place] [address], [place], [place] [address]   Respiratory Panel by RT PCR (Flu A&B, Covid) - Nasopharyngeal Swab     Status: None   Collection Time: 05/15/20  1:09 PM   Specimen: Nasopharyngeal Swab  Result Value Ref Range Status   SARS Coronavirus 2 by RT PCR NEGATIVE NEGATIVE Final     Comment: (NOTE) SARS-CoV-2 target nucleic acids are NOT DETECTED.  The SARS-CoV-2 RNA is generally detectable in upper respiratoy specimens during the acute phase of infection. The lowest concentration of SARS-CoV-2 viral copies this assay can detect is 131 copies/mL. A negative result does not preclude SARS-Cov-2 infection and should not be used as the sole basis for treatment or other patient management decisions. A negative result may occur with  improper specimen collection/handling, submission of specimen  other than nasopharyngeal swab, presence of viral mutation(s) within the areas targeted by this assay, and inadequate number of viral copies (<131 copies/mL). A negative result must be combined with clinical observations, patient history, and epidemiological information. The expected result is Negative.  Fact Sheet for Patients:  PinkCheek.be  Fact Sheet for Healthcare Providers:  GravelBags.it  This test is no t yet approved or cleared by the [place] FDA and  has been authorized for detection and/or diagnosis of SARS-CoV-2 by FDA under an Emergency Use Authorization (EUA). This EUA will remain  in effect (meaning this test can be used) for the duration of the COVID-19 declaration under Section 564(b)(1) of the Act, 21 U.S.C. section 360bbb-3(b)(1), unless the authorization is terminated or revoked sooner.     Influenza A by PCR NEGATIVE NEGATIVE Final   Influenza B by PCR NEGATIVE NEGATIVE Final    Comment: (NOTE) The Xpert Xpress SARS-CoV-2/FLU/RSV assay is intended as an aid in  the diagnosis of influenza from Nasopharyngeal swab specimens and  should not be used as a sole basis for treatment. Nasal washings and  aspirates are unacceptable for Xpert Xpress SARS-CoV-2/FLU/RSV  testing.  Fact Sheet for Patients: PinkCheek.be  Fact Sheet for Healthcare  Providers: GravelBags.it  This test is not yet approved or cleared by the [place] FDA and  has been authorized for detection and/or diagnosis of SARS-CoV-2 by  FDA under an Emergency Use Authorization (EUA). This EUA will remain  in effect (meaning this test can be used) for the duration of the  Covid-19 declaration under Section 564(b)(1) of the Act, 21  U.S.C. section 360bbb-3(b)(1), unless the authorization is  terminated or revoked. Performed at [facility], [place] [address], [place], [place] [address]      Labs: CBC: Recent Labs  Lab 05/12/20 0253 05/13/20 0309 05/14/20 0310 05/15/20 0259 05/16/20 0313  WBC 11.7* 8.5 8.8 7.0 7.9  HGB 7.8* 7.8* 9.1* 8.5* 9.6*  HCT 25.6* 25.9* 29.9* 26.6* 29.1*  MCV 93.8 93.2 93.7 90.8 87.7  PLT 421* 441* 443* 420* 414*    Basic Metabolic Panel: Recent Labs  Lab 05/12/20 0701 05/14/20 0310 05/15/20 1634 05/17/20 0620 05/18/20 0513  NA 140  --  137 138 140  K 5.4*  --  4.4 3.8 3.1*  CL 98  --  89* 92* 93*  CO2 33*  --  38* 38* 38*  GLUCOSE 266*  --  246* 128* 125*  BUN 26*  --  26* 20 19  CREATININE 1.92* 1.60* 1.34* 1.28* 1.31*  CALCIUM 7.9*  --  9.0 8.4* 8.4*  MG  --   --   --   --  1.2*    Liver Function Tests: Recent Labs  Lab 05/15/20 1634  AST 27  ALT 32  ALKPHOS 93  BILITOT 1.0  PROT 6.2*  ALBUMIN 3.0*    CBG: Recent Labs  Lab 05/17/20 1205 05/17/20 1618 05/17/20 2054 05/18/20 0739 05/18/20 1150  GLUCAP 144* 118* 124* 156* 282*     Thyroid function studies Recent Labs    05/15/20 1514  TSH 2.825     Urinalysis    Component Value Date/Time   COLORURINE AMBER (A) 04/28/2020 2016   APPEARANCEUR CLOUDY (A) 04/28/2020 2016   LABSPEC 1.020 04/28/2020 2016   PHURINE 5.0 04/28/2020 2016   GLUCOSEU NEGATIVE 04/28/2020 2016   HGBUR NEGATIVE 04/28/2020 2016   BILIRUBINUR NEGATIVE 04/28/2020 2016   KETONESUR NEGATIVE 04/28/2020 2016    PROTEINUR 100 (A)  04/28/2020 2016   UROBILINOGEN 0.2 11/05/2014 1712   NITRITE NEGATIVE 04/28/2020 2016   LEUKOCYTESUR LARGE (A) 04/28/2020 2016    I discussed in detail with patient's nephew via phone, updated care and answered all questions.  Time coordinating discharge: 50 minutes  SIGNED:  Vernell Leep, MD, [place], [facility]. [facility]  To contact the attending provider between 7A-7P or the covering provider during after hours 7P-7A, please log into the web site [email or web address] and access using universal  password for that web site. If you do not have the password, please call the hospital operator.

## 2020-05-19 ENCOUNTER — Non-Acute Institutional Stay: Payer: Medicare HMO | Admitting: Hospice

## 2020-05-19 ENCOUNTER — Other Ambulatory Visit: Payer: Self-pay

## 2020-05-19 DIAGNOSIS — A419 Sepsis, unspecified organism: Secondary | ICD-10-CM | POA: Diagnosis not present

## 2020-05-19 DIAGNOSIS — E7800 Pure hypercholesterolemia, unspecified: Secondary | ICD-10-CM | POA: Diagnosis present

## 2020-05-19 DIAGNOSIS — I5033 Acute on chronic diastolic (congestive) heart failure: Secondary | ICD-10-CM | POA: Diagnosis not present

## 2020-05-19 DIAGNOSIS — L89152 Pressure ulcer of sacral region, stage 2: Secondary | ICD-10-CM | POA: Diagnosis not present

## 2020-05-19 DIAGNOSIS — I8290 Acute embolism and thrombosis of unspecified vein: Secondary | ICD-10-CM | POA: Diagnosis not present

## 2020-05-19 DIAGNOSIS — F0390 Unspecified dementia without behavioral disturbance: Secondary | ICD-10-CM | POA: Diagnosis present

## 2020-05-19 DIAGNOSIS — Z515 Encounter for palliative care: Secondary | ICD-10-CM

## 2020-05-19 DIAGNOSIS — M25561 Pain in right knee: Secondary | ICD-10-CM | POA: Diagnosis not present

## 2020-05-19 DIAGNOSIS — Z87891 Personal history of nicotine dependence: Secondary | ICD-10-CM | POA: Diagnosis not present

## 2020-05-19 DIAGNOSIS — J9621 Acute and chronic respiratory failure with hypoxia: Secondary | ICD-10-CM | POA: Diagnosis not present

## 2020-05-19 DIAGNOSIS — K5909 Other constipation: Secondary | ICD-10-CM | POA: Diagnosis not present

## 2020-05-19 DIAGNOSIS — J9622 Acute and chronic respiratory failure with hypercapnia: Secondary | ICD-10-CM | POA: Diagnosis not present

## 2020-05-19 DIAGNOSIS — R262 Difficulty in walking, not elsewhere classified: Secondary | ICD-10-CM | POA: Diagnosis not present

## 2020-05-19 DIAGNOSIS — C61 Malignant neoplasm of prostate: Secondary | ICD-10-CM

## 2020-05-19 DIAGNOSIS — K219 Gastro-esophageal reflux disease without esophagitis: Secondary | ICD-10-CM | POA: Diagnosis not present

## 2020-05-19 DIAGNOSIS — Z66 Do not resuscitate: Secondary | ICD-10-CM | POA: Diagnosis not present

## 2020-05-19 DIAGNOSIS — M6281 Muscle weakness (generalized): Secondary | ICD-10-CM | POA: Diagnosis not present

## 2020-05-19 DIAGNOSIS — N390 Urinary tract infection, site not specified: Secondary | ICD-10-CM | POA: Diagnosis not present

## 2020-05-19 DIAGNOSIS — M2550 Pain in unspecified joint: Secondary | ICD-10-CM | POA: Diagnosis not present

## 2020-05-19 DIAGNOSIS — R627 Adult failure to thrive: Secondary | ICD-10-CM | POA: Diagnosis not present

## 2020-05-19 DIAGNOSIS — N1832 Chronic kidney disease, stage 3b: Secondary | ICD-10-CM | POA: Diagnosis not present

## 2020-05-19 DIAGNOSIS — I5031 Acute diastolic (congestive) heart failure: Secondary | ICD-10-CM | POA: Diagnosis not present

## 2020-05-19 DIAGNOSIS — J9602 Acute respiratory failure with hypercapnia: Secondary | ICD-10-CM | POA: Diagnosis not present

## 2020-05-19 DIAGNOSIS — E1122 Type 2 diabetes mellitus with diabetic chronic kidney disease: Secondary | ICD-10-CM | POA: Diagnosis not present

## 2020-05-19 DIAGNOSIS — R4189 Other symptoms and signs involving cognitive functions and awareness: Secondary | ICD-10-CM | POA: Diagnosis not present

## 2020-05-19 DIAGNOSIS — E875 Hyperkalemia: Secondary | ICD-10-CM | POA: Diagnosis not present

## 2020-05-19 DIAGNOSIS — M1990 Unspecified osteoarthritis, unspecified site: Secondary | ICD-10-CM | POA: Diagnosis present

## 2020-05-19 DIAGNOSIS — J449 Chronic obstructive pulmonary disease, unspecified: Secondary | ICD-10-CM | POA: Diagnosis not present

## 2020-05-19 DIAGNOSIS — M7989 Other specified soft tissue disorders: Secondary | ICD-10-CM | POA: Diagnosis not present

## 2020-05-19 DIAGNOSIS — R600 Localized edema: Secondary | ICD-10-CM | POA: Diagnosis not present

## 2020-05-19 DIAGNOSIS — M79604 Pain in right leg: Secondary | ICD-10-CM | POA: Diagnosis not present

## 2020-05-19 DIAGNOSIS — Z20822 Contact with and (suspected) exposure to covid-19: Secondary | ICD-10-CM | POA: Diagnosis not present

## 2020-05-19 DIAGNOSIS — I6782 Cerebral ischemia: Secondary | ICD-10-CM | POA: Diagnosis not present

## 2020-05-19 DIAGNOSIS — R531 Weakness: Secondary | ICD-10-CM | POA: Diagnosis not present

## 2020-05-19 DIAGNOSIS — R0600 Dyspnea, unspecified: Secondary | ICD-10-CM | POA: Diagnosis not present

## 2020-05-19 DIAGNOSIS — R092 Respiratory arrest: Secondary | ICD-10-CM | POA: Diagnosis not present

## 2020-05-19 DIAGNOSIS — I130 Hypertensive heart and chronic kidney disease with heart failure and stage 1 through stage 4 chronic kidney disease, or unspecified chronic kidney disease: Secondary | ICD-10-CM | POA: Diagnosis present

## 2020-05-19 DIAGNOSIS — J690 Pneumonitis due to inhalation of food and vomit: Secondary | ICD-10-CM | POA: Diagnosis not present

## 2020-05-19 DIAGNOSIS — L98419 Non-pressure chronic ulcer of buttock with unspecified severity: Secondary | ICD-10-CM | POA: Diagnosis not present

## 2020-05-19 DIAGNOSIS — R634 Abnormal weight loss: Secondary | ICD-10-CM | POA: Diagnosis not present

## 2020-05-19 DIAGNOSIS — I4510 Unspecified right bundle-branch block: Secondary | ICD-10-CM | POA: Diagnosis not present

## 2020-05-19 DIAGNOSIS — J9601 Acute respiratory failure with hypoxia: Secondary | ICD-10-CM

## 2020-05-19 DIAGNOSIS — R1310 Dysphagia, unspecified: Secondary | ICD-10-CM | POA: Diagnosis present

## 2020-05-19 DIAGNOSIS — M79661 Pain in right lower leg: Secondary | ICD-10-CM | POA: Diagnosis not present

## 2020-05-19 DIAGNOSIS — M79662 Pain in left lower leg: Secondary | ICD-10-CM | POA: Diagnosis not present

## 2020-05-19 DIAGNOSIS — E46 Unspecified protein-calorie malnutrition: Secondary | ICD-10-CM | POA: Diagnosis not present

## 2020-05-19 DIAGNOSIS — R0989 Other specified symptoms and signs involving the circulatory and respiratory systems: Secondary | ICD-10-CM | POA: Diagnosis not present

## 2020-05-19 DIAGNOSIS — L89150 Pressure ulcer of sacral region, unstageable: Secondary | ICD-10-CM | POA: Diagnosis not present

## 2020-05-19 DIAGNOSIS — E860 Dehydration: Secondary | ICD-10-CM | POA: Diagnosis not present

## 2020-05-19 DIAGNOSIS — I2699 Other pulmonary embolism without acute cor pulmonale: Secondary | ICD-10-CM | POA: Diagnosis not present

## 2020-05-19 DIAGNOSIS — R410 Disorientation, unspecified: Secondary | ICD-10-CM | POA: Diagnosis not present

## 2020-05-19 DIAGNOSIS — K567 Ileus, unspecified: Secondary | ICD-10-CM | POA: Diagnosis not present

## 2020-05-19 DIAGNOSIS — R2241 Localized swelling, mass and lump, right lower limb: Secondary | ICD-10-CM | POA: Diagnosis not present

## 2020-05-19 DIAGNOSIS — I2609 Other pulmonary embolism with acute cor pulmonale: Secondary | ICD-10-CM | POA: Diagnosis not present

## 2020-05-19 DIAGNOSIS — I454 Nonspecific intraventricular block: Secondary | ICD-10-CM | POA: Diagnosis not present

## 2020-05-19 DIAGNOSIS — M25571 Pain in right ankle and joints of right foot: Secondary | ICD-10-CM | POA: Diagnosis not present

## 2020-05-19 DIAGNOSIS — Z9189 Other specified personal risk factors, not elsewhere classified: Secondary | ICD-10-CM | POA: Diagnosis not present

## 2020-05-19 DIAGNOSIS — G894 Chronic pain syndrome: Secondary | ICD-10-CM | POA: Diagnosis not present

## 2020-05-19 DIAGNOSIS — E785 Hyperlipidemia, unspecified: Secondary | ICD-10-CM | POA: Diagnosis present

## 2020-05-19 DIAGNOSIS — E1169 Type 2 diabetes mellitus with other specified complication: Secondary | ICD-10-CM | POA: Diagnosis not present

## 2020-05-19 DIAGNOSIS — L98492 Non-pressure chronic ulcer of skin of other sites with fat layer exposed: Secondary | ICD-10-CM | POA: Diagnosis not present

## 2020-05-19 DIAGNOSIS — N179 Acute kidney failure, unspecified: Secondary | ICD-10-CM | POA: Diagnosis not present

## 2020-05-19 DIAGNOSIS — R404 Transient alteration of awareness: Secondary | ICD-10-CM | POA: Diagnosis not present

## 2020-05-19 DIAGNOSIS — Z6824 Body mass index (BMI) 24.0-24.9, adult: Secondary | ICD-10-CM | POA: Diagnosis not present

## 2020-05-19 DIAGNOSIS — R5383 Other fatigue: Secondary | ICD-10-CM | POA: Diagnosis not present

## 2020-05-19 DIAGNOSIS — L89610 Pressure ulcer of right heel, unstageable: Secondary | ICD-10-CM | POA: Diagnosis not present

## 2020-05-19 DIAGNOSIS — I5032 Chronic diastolic (congestive) heart failure: Secondary | ICD-10-CM | POA: Diagnosis not present

## 2020-05-19 DIAGNOSIS — R031 Nonspecific low blood-pressure reading: Secondary | ICD-10-CM | POA: Diagnosis not present

## 2020-05-19 DIAGNOSIS — R0609 Other forms of dyspnea: Secondary | ICD-10-CM | POA: Diagnosis not present

## 2020-05-19 DIAGNOSIS — F05 Delirium due to known physiological condition: Secondary | ICD-10-CM | POA: Diagnosis not present

## 2020-05-19 DIAGNOSIS — R0602 Shortness of breath: Secondary | ICD-10-CM | POA: Diagnosis not present

## 2020-05-19 DIAGNOSIS — R5381 Other malaise: Secondary | ICD-10-CM | POA: Diagnosis not present

## 2020-05-19 DIAGNOSIS — I472 Ventricular tachycardia: Secondary | ICD-10-CM | POA: Diagnosis not present

## 2020-05-19 DIAGNOSIS — J9620 Acute and chronic respiratory failure, unspecified whether with hypoxia or hypercapnia: Secondary | ICD-10-CM | POA: Diagnosis not present

## 2020-05-19 DIAGNOSIS — I452 Bifascicular block: Secondary | ICD-10-CM | POA: Diagnosis present

## 2020-05-19 DIAGNOSIS — W19XXXA Unspecified fall, initial encounter: Secondary | ICD-10-CM | POA: Diagnosis not present

## 2020-05-19 DIAGNOSIS — R6520 Severe sepsis without septic shock: Secondary | ICD-10-CM | POA: Diagnosis present

## 2020-05-19 DIAGNOSIS — J90 Pleural effusion, not elsewhere classified: Secondary | ICD-10-CM | POA: Diagnosis not present

## 2020-05-19 DIAGNOSIS — J9600 Acute respiratory failure, unspecified whether with hypoxia or hypercapnia: Secondary | ICD-10-CM | POA: Diagnosis not present

## 2020-05-19 DIAGNOSIS — Z7401 Bed confinement status: Secondary | ICD-10-CM | POA: Diagnosis not present

## 2020-05-19 DIAGNOSIS — N1831 Chronic kidney disease, stage 3a: Secondary | ICD-10-CM | POA: Diagnosis not present

## 2020-05-19 DIAGNOSIS — Z8546 Personal history of malignant neoplasm of prostate: Secondary | ICD-10-CM | POA: Diagnosis not present

## 2020-05-19 DIAGNOSIS — J189 Pneumonia, unspecified organism: Secondary | ICD-10-CM | POA: Diagnosis not present

## 2020-05-19 DIAGNOSIS — G8929 Other chronic pain: Secondary | ICD-10-CM | POA: Diagnosis not present

## 2020-05-19 DIAGNOSIS — I10 Essential (primary) hypertension: Secondary | ICD-10-CM | POA: Diagnosis not present

## 2020-05-19 DIAGNOSIS — R1084 Generalized abdominal pain: Secondary | ICD-10-CM | POA: Diagnosis not present

## 2020-05-19 DIAGNOSIS — E43 Unspecified severe protein-calorie malnutrition: Secondary | ICD-10-CM | POA: Diagnosis not present

## 2020-05-19 DIAGNOSIS — E1165 Type 2 diabetes mellitus with hyperglycemia: Secondary | ICD-10-CM | POA: Diagnosis not present

## 2020-05-19 DIAGNOSIS — R42 Dizziness and giddiness: Secondary | ICD-10-CM | POA: Diagnosis not present

## 2020-05-19 DIAGNOSIS — D649 Anemia, unspecified: Secondary | ICD-10-CM | POA: Diagnosis not present

## 2020-05-19 DIAGNOSIS — L89154 Pressure ulcer of sacral region, stage 4: Secondary | ICD-10-CM | POA: Diagnosis not present

## 2020-05-19 NOTE — Progress Notes (Signed)
[facility] Consult Note Telephone: [phone]  Fax: [phone]  PATIENT NAME: Jay Holloway [address] [place] [address] [phone] (home)  DOB: 1945/10/29 MRN: [id number]  PRIMARY CARE PROVIDER:    [name], MD,  [address] [place] [address] [phone]  REFERRING PROVIDER:   [name], MD [address] [place],  [place] [address] [phone]  RESPONSIBLE PARTY: Self Extended Emergency Contact Information Primary Emergency Contact: Chapman,Carol Address: [address] [place], [place] [address] Johnnette Litter of [place] Phone: [phone] Mobile Phone: [phone] Relation: Friend Secondary Emergency Contact: Jodelle Red [place] of [place] Mobile Phone: [phone] Relation: Nephew  I met face to face with patient and family in home/facility.  RECOMMENDATIONS/PLAN:    Visit at the request of Martinique Blattenberger NP for palliative consult. Visit consisted of building trust and discussions on Palliative Medicine as specialized medical care for people living with serious illness, aimed at facilitating better quality of life through symptoms relief, assisting with advance care plan and establishing goals of care.   Discussion on the difference between Palliative and Hospice care. Palliative care and hospice have similar goals of managing symptoms, promoting comfort, improving quality of life, and maintaining a person's dignity. However, palliative care may be offered during any phase of a serious illness, while hospice care is usually offered when a person is expected to live for 6 months or less. Patient endorsed palliative care services  Advance Care Planning: Our advance care planning conversation included a discussion about:    The value and importance of advance care planning  Experiences with loved ones who have been seriously ill or have  died  Exploration of personal, cultural or spiritual beliefs that might influence medical decisions  Exploration of goals of care in the event of a sudden injury or illness  Identification and preparation of a healthcare agent  Review and updating or creation of an  advance directive document  CODE STATUS: Code status reviewed; patient is a DNR  GOALS OF CARE: Goals of care include to maximize quality of life and symptom management.  Follow up Palliative Care Visit: Palliative care will continue to follow for goals of care clarification and symptom management.   Functional decline/Symptom Management: Patient with recurring (4) hospitalizations in the past 3 months, - last hospitalization 10/13-10/21/2021 for acute respiratory failure with hypoxia and hypercarbia,   Patient with no dyspnea/shortness of breath, not on oxygen supplementation 98% oxygenation on room air. He has Levalbuterol breathing treatment if needed.  Fatigue: PT OT is ongoing for strengthening. Patient not able to ambulate at this time. Goal: transfers and possibley short distance ambulation. Patient denied pain but complained about his bed which he said is not accommodating him well because of his height. NP discussed with unit manager Charlene who ordered to fix bed extension for patient. Nursing with no concerns.  Palliative will continue to monitor for symptom management/decline and make recommendations as needed.  I spent one hour and 46 minutes providing this initial consultation; time includes time spent with patient/family, chart review, provider coordination,  and documentation. More than 50% of the time in this consultation was spent on coordinating communication  CHIEF COMPLAIN/HISTORY OF PRESENT ILLNESS:  Jay Holloway is a 74 y.o. male with multiple medical problems including acute respiratory failure with hypoxia and hypercarbia,.hx of Prostate CA, DM HTN. Palliative Care was asked to follow this patient  by  consultation request of Martinique Blattenberger NP to help address advance care planning and goals of care.  CODE STATUS: DNR  PPS: 40%  HOSPICE ELIGIBILITY/DIAGNOSIS: TBD  PAST MEDICAL HISTORY:  Past Medical History:  Diagnosis Date  . Arthritis    trouble turning head side to side and up  . Diabetes mellitus without complication ([facility])   . Hemorrhoids   . High cholesterol   . Hypertension   . Prostate cancer [facility])     SOCIAL HX:  Social History   Tobacco Use  . Smoking status: Former Smoker    Packs/day: 0.25    Years: 4.00    Pack years: 1.00    Types: Cigarettes    Quit date: 12/16/1978    Years since quitting: 41.4  . Smokeless tobacco: Never Used  Substance Use Topics  . Alcohol use: Not Currently    Comment: remote h/o heavy use   FAMILY HX:  Family History  Family history unknown: Yes    ALLERGIES: No Known Allergies   PERTINENT MEDICATIONS:  Outpatient Encounter Medications as of 05/19/2020  Medication Sig  . acetaminophen (TYLENOL) 325 MG tablet Take 2 tablets (650 mg total) by mouth every 6 (six) hours as needed for mild pain, moderate pain or headache.  Marland Kitchen apixaban (ELIQUIS) 5 MG TABS tablet Take 1 tablet (5 mg total) by mouth 2 (two) times daily.  Marland Kitchen atorvastatin (LIPITOR) 10 MG tablet Take 10 mg by mouth daily.   . Cholecalciferol (VITAMIN D3) 25 MCG (1000 UT) CAPS Take 1,000 Units by mouth daily.   Marland Kitchen diltiazem (CARDIZEM) 60 MG tablet Take 1 tablet (60 mg total) by mouth 3 (three) times daily.  . feeding supplement (ENSURE ENLIVE / ENSURE PLUS) LIQD Take 237 mLs by mouth 2 (two) times daily between meals.  . furosemide (LASIX) 40 MG tablet Take 0.5 tablets (20 mg total) by mouth daily. fluid  . insulin glargine (LANTUS) 100 UNIT/ML injection Inject 0.1 mLs (10 Units total) into the skin daily.  Marland Kitchen levalbuterol (XOPENEX) 1.25 MG/0.5ML nebulizer solution Take 0.63 mg by nebulization every 6 (six) hours as needed for wheezing or shortness of breath.  .  Maltodextrin-Xanthan Gum (RESOURCE THICKENUP CLEAR) POWD Oral, As needed  . metFORMIN (GLUCOPHAGE-XR) 500 MG 24 hr tablet Take 500 mg by mouth at bedtime.   . Multiple Vitamins-Minerals (MULTIVITAMIN ADULTS) TABS Take 1 tablet by mouth daily.  . ondansetron (ZOFRAN) 4 MG tablet Take 4 mg by mouth every 8 (eight) hours as needed for nausea or vomiting.  . pantoprazole (PROTONIX) 40 MG tablet Take 1 tablet (40 mg total) by mouth 2 (two) times daily.  . potassium chloride 20 MEQ TBCR Take 20 mEq by mouth daily.  . Semaglutide (OZEMPIC, 1 MG/DOSE, [place]) Inject 1 mg into the skin once a week. Wed   No facility-administered encounter medications on file as of 05/19/2020.    PHYSICAL EXAM / ROS: Height:  6 feet 2 inches  Weight: 204 Lbs General: In no acute distress Cardiovascular: regular rate and rhythm, no chest pain reported Pulmonary: no cough, no shortness of breath, clear ant/post fields, normal respiratory effort on room air Abdomen: soft, non tender, positive bowel sounds in all quadrants GU: denies dysuria, no suprapubic tenderness Extremities: no edema, no joint deformities Skin: Decubitus ulcer of sacral region, unstageable; followed by wound nurse Neurological: Weakness but otherwise non focal  Teodoro Spray, NP

## 2020-05-19 NOTE — Progress Notes (Signed)
0040- Discharged stable not in distress, transported by [facility].

## 2020-05-24 DIAGNOSIS — M79662 Pain in left lower leg: Secondary | ICD-10-CM | POA: Diagnosis not present

## 2020-05-24 DIAGNOSIS — M79661 Pain in right lower leg: Secondary | ICD-10-CM | POA: Diagnosis not present

## 2020-05-24 DIAGNOSIS — R627 Adult failure to thrive: Secondary | ICD-10-CM | POA: Diagnosis not present

## 2020-05-24 DIAGNOSIS — G8929 Other chronic pain: Secondary | ICD-10-CM | POA: Diagnosis not present

## 2020-05-25 DIAGNOSIS — L89610 Pressure ulcer of right heel, unstageable: Secondary | ICD-10-CM | POA: Diagnosis not present

## 2020-05-25 DIAGNOSIS — L89150 Pressure ulcer of sacral region, unstageable: Secondary | ICD-10-CM | POA: Diagnosis not present

## 2020-05-26 DIAGNOSIS — N1831 Chronic kidney disease, stage 3a: Secondary | ICD-10-CM | POA: Diagnosis not present

## 2020-05-26 DIAGNOSIS — G894 Chronic pain syndrome: Secondary | ICD-10-CM | POA: Diagnosis not present

## 2020-05-26 DIAGNOSIS — D649 Anemia, unspecified: Secondary | ICD-10-CM | POA: Diagnosis not present

## 2020-05-26 DIAGNOSIS — M6281 Muscle weakness (generalized): Secondary | ICD-10-CM | POA: Diagnosis not present

## 2020-06-01 DIAGNOSIS — L89154 Pressure ulcer of sacral region, stage 4: Secondary | ICD-10-CM | POA: Diagnosis not present

## 2020-06-01 DIAGNOSIS — L89610 Pressure ulcer of right heel, unstageable: Secondary | ICD-10-CM | POA: Diagnosis not present

## 2020-06-05 DIAGNOSIS — R627 Adult failure to thrive: Secondary | ICD-10-CM | POA: Diagnosis not present

## 2020-06-05 DIAGNOSIS — N1832 Chronic kidney disease, stage 3b: Secondary | ICD-10-CM | POA: Diagnosis not present

## 2020-06-05 DIAGNOSIS — G8929 Other chronic pain: Secondary | ICD-10-CM | POA: Diagnosis not present

## 2020-06-06 DIAGNOSIS — N1832 Chronic kidney disease, stage 3b: Secondary | ICD-10-CM | POA: Diagnosis not present

## 2020-06-06 DIAGNOSIS — R5381 Other malaise: Secondary | ICD-10-CM | POA: Diagnosis not present

## 2020-06-06 DIAGNOSIS — R0600 Dyspnea, unspecified: Secondary | ICD-10-CM | POA: Diagnosis not present

## 2020-06-06 DIAGNOSIS — F05 Delirium due to known physiological condition: Secondary | ICD-10-CM | POA: Diagnosis not present

## 2020-06-06 DIAGNOSIS — R627 Adult failure to thrive: Secondary | ICD-10-CM | POA: Diagnosis not present

## 2020-06-06 DIAGNOSIS — R634 Abnormal weight loss: Secondary | ICD-10-CM | POA: Diagnosis not present

## 2020-06-08 DIAGNOSIS — N1832 Chronic kidney disease, stage 3b: Secondary | ICD-10-CM | POA: Diagnosis not present

## 2020-06-08 DIAGNOSIS — R627 Adult failure to thrive: Secondary | ICD-10-CM | POA: Diagnosis not present

## 2020-06-08 DIAGNOSIS — F05 Delirium due to known physiological condition: Secondary | ICD-10-CM | POA: Diagnosis not present

## 2020-06-08 DIAGNOSIS — N179 Acute kidney failure, unspecified: Secondary | ICD-10-CM | POA: Diagnosis not present

## 2020-06-08 DIAGNOSIS — E875 Hyperkalemia: Secondary | ICD-10-CM | POA: Diagnosis not present

## 2020-06-08 DIAGNOSIS — R5381 Other malaise: Secondary | ICD-10-CM | POA: Diagnosis not present

## 2020-06-08 DIAGNOSIS — L89154 Pressure ulcer of sacral region, stage 4: Secondary | ICD-10-CM | POA: Diagnosis not present

## 2020-06-08 DIAGNOSIS — E860 Dehydration: Secondary | ICD-10-CM | POA: Diagnosis not present

## 2020-06-09 DIAGNOSIS — R5381 Other malaise: Secondary | ICD-10-CM | POA: Diagnosis not present

## 2020-06-09 DIAGNOSIS — R5383 Other fatigue: Secondary | ICD-10-CM | POA: Diagnosis not present

## 2020-06-09 DIAGNOSIS — N1832 Chronic kidney disease, stage 3b: Secondary | ICD-10-CM | POA: Diagnosis not present

## 2020-06-09 DIAGNOSIS — N179 Acute kidney failure, unspecified: Secondary | ICD-10-CM | POA: Diagnosis not present

## 2020-06-09 DIAGNOSIS — F05 Delirium due to known physiological condition: Secondary | ICD-10-CM | POA: Diagnosis not present

## 2020-06-09 DIAGNOSIS — N390 Urinary tract infection, site not specified: Secondary | ICD-10-CM | POA: Diagnosis not present

## 2020-06-12 DIAGNOSIS — N179 Acute kidney failure, unspecified: Secondary | ICD-10-CM | POA: Diagnosis not present

## 2020-06-12 DIAGNOSIS — F05 Delirium due to known physiological condition: Secondary | ICD-10-CM | POA: Diagnosis not present

## 2020-06-12 DIAGNOSIS — R5381 Other malaise: Secondary | ICD-10-CM | POA: Diagnosis not present

## 2020-06-12 DIAGNOSIS — N390 Urinary tract infection, site not specified: Secondary | ICD-10-CM | POA: Diagnosis not present

## 2020-06-12 DIAGNOSIS — N1832 Chronic kidney disease, stage 3b: Secondary | ICD-10-CM | POA: Diagnosis not present

## 2020-06-12 DIAGNOSIS — E875 Hyperkalemia: Secondary | ICD-10-CM | POA: Diagnosis not present

## 2020-06-13 DIAGNOSIS — G894 Chronic pain syndrome: Secondary | ICD-10-CM | POA: Diagnosis not present

## 2020-06-13 DIAGNOSIS — R600 Localized edema: Secondary | ICD-10-CM | POA: Diagnosis not present

## 2020-06-13 DIAGNOSIS — M6281 Muscle weakness (generalized): Secondary | ICD-10-CM | POA: Diagnosis not present

## 2020-06-13 DIAGNOSIS — N390 Urinary tract infection, site not specified: Secondary | ICD-10-CM | POA: Diagnosis not present

## 2020-06-13 DIAGNOSIS — M25571 Pain in right ankle and joints of right foot: Secondary | ICD-10-CM | POA: Diagnosis not present

## 2020-06-14 DIAGNOSIS — M6281 Muscle weakness (generalized): Secondary | ICD-10-CM | POA: Diagnosis not present

## 2020-06-14 DIAGNOSIS — R42 Dizziness and giddiness: Secondary | ICD-10-CM | POA: Diagnosis not present

## 2020-06-14 DIAGNOSIS — E860 Dehydration: Secondary | ICD-10-CM | POA: Diagnosis not present

## 2020-06-14 DIAGNOSIS — I10 Essential (primary) hypertension: Secondary | ICD-10-CM | POA: Diagnosis not present

## 2020-06-14 DIAGNOSIS — R031 Nonspecific low blood-pressure reading: Secondary | ICD-10-CM | POA: Diagnosis not present

## 2020-06-15 DIAGNOSIS — L89154 Pressure ulcer of sacral region, stage 4: Secondary | ICD-10-CM | POA: Diagnosis not present

## 2020-06-19 DIAGNOSIS — R600 Localized edema: Secondary | ICD-10-CM | POA: Diagnosis not present

## 2020-06-19 DIAGNOSIS — E860 Dehydration: Secondary | ICD-10-CM | POA: Diagnosis not present

## 2020-06-19 DIAGNOSIS — M79661 Pain in right lower leg: Secondary | ICD-10-CM | POA: Diagnosis not present

## 2020-06-19 DIAGNOSIS — R031 Nonspecific low blood-pressure reading: Secondary | ICD-10-CM | POA: Diagnosis not present

## 2020-06-19 DIAGNOSIS — N179 Acute kidney failure, unspecified: Secondary | ICD-10-CM | POA: Diagnosis not present

## 2020-06-19 DIAGNOSIS — I10 Essential (primary) hypertension: Secondary | ICD-10-CM | POA: Diagnosis not present

## 2020-06-19 DIAGNOSIS — R42 Dizziness and giddiness: Secondary | ICD-10-CM | POA: Diagnosis not present

## 2020-06-19 DIAGNOSIS — N1832 Chronic kidney disease, stage 3b: Secondary | ICD-10-CM | POA: Diagnosis not present

## 2020-06-19 DIAGNOSIS — M25561 Pain in right knee: Secondary | ICD-10-CM | POA: Diagnosis not present

## 2020-06-27 DIAGNOSIS — R262 Difficulty in walking, not elsewhere classified: Secondary | ICD-10-CM | POA: Diagnosis not present

## 2020-06-27 DIAGNOSIS — R1084 Generalized abdominal pain: Secondary | ICD-10-CM | POA: Diagnosis not present

## 2020-06-27 DIAGNOSIS — M6281 Muscle weakness (generalized): Secondary | ICD-10-CM | POA: Diagnosis not present

## 2020-06-27 DIAGNOSIS — M79604 Pain in right leg: Secondary | ICD-10-CM | POA: Diagnosis not present

## 2020-06-27 DIAGNOSIS — E860 Dehydration: Secondary | ICD-10-CM | POA: Diagnosis not present

## 2020-06-27 DIAGNOSIS — K5909 Other constipation: Secondary | ICD-10-CM | POA: Diagnosis not present

## 2020-06-27 DIAGNOSIS — R5381 Other malaise: Secondary | ICD-10-CM | POA: Diagnosis not present

## 2020-06-29 DIAGNOSIS — L89154 Pressure ulcer of sacral region, stage 4: Secondary | ICD-10-CM | POA: Diagnosis not present

## 2020-06-30 ENCOUNTER — Non-Acute Institutional Stay: Payer: Medicare HMO | Admitting: Hospice

## 2020-06-30 ENCOUNTER — Other Ambulatory Visit: Payer: Self-pay

## 2020-06-30 DIAGNOSIS — Z515 Encounter for palliative care: Secondary | ICD-10-CM

## 2020-06-30 DIAGNOSIS — M6281 Muscle weakness (generalized): Secondary | ICD-10-CM | POA: Diagnosis not present

## 2020-06-30 DIAGNOSIS — M79604 Pain in right leg: Secondary | ICD-10-CM | POA: Diagnosis not present

## 2020-06-30 DIAGNOSIS — R262 Difficulty in walking, not elsewhere classified: Secondary | ICD-10-CM | POA: Diagnosis not present

## 2020-06-30 DIAGNOSIS — R5381 Other malaise: Secondary | ICD-10-CM | POA: Diagnosis not present

## 2020-06-30 DIAGNOSIS — J9601 Acute respiratory failure with hypoxia: Secondary | ICD-10-CM

## 2020-06-30 NOTE — Progress Notes (Signed)
[facility] Consult Note Telephone: [phone]  Fax: [phone]  PATIENT NAME: Jay Holloway DOB: [birth year] MRN: [id number]  PRIMARY CARE PROVIDER:   [name], MD [name], MD [address] [place],  [place] [address]  REFERRING PROVIDER: [name], MD [name], [place] [address] [place],  [place] [address]  RESPONSIBLE PARTY: Self Extended Emergency Contact Information Primary Emergency Contact: Chapman,Carol Address: [address], [place] [address] Johnnette Litter of [place] Phone: [phone] Mobile Phone: [phone] Relation: Friend Secondary Emergency Contact: Jodelle Red [place] Mobile Phone: [phone] Relation: Nephew  I met face to face with patient and family in home/facility.   RECOMMENDATIONS/PLAN:   Visit consisted of building trust and follow up on palliative care.  Advance Care Planning/Code Status: Code status reviewed; patient is a DNR  Goals of Care: Goals of care include to maximize quality of life and symptom management.  Follow up: Palliative care will continue to follow patient for goals of care clarification and symptom management. Symptom Management:  No report of respiratory distress, patient not on oxygen supplementation, in no acute distress. Patient with recurring (4) hospitalizations in the past, - last hospitalization 10/13-10/21/2021 for acute respiratory failure with hypoxia and hypercarbia. Fatigue: PT OT is ongoing for strengthening.  PT reports patient is participating, improving, now able to stand pivot transfer with standby/contact-guard assist ;patient not able to ambulate at this time. Goal: transfers and possibly short distance ambulation.  Patient reports doing well; collaborated by Martinique Blattenberger, NP. Nursing with no concerns. Encouraged ongoing care.  Palliative will continue to monitor for  symptom management/decline and make recommendations as needed.  I spent 46 minutes providing this consultation; time includes time spent with patient/family, chart review, provider coordination,  and documentation. More than 50% of the time in this consultation was spent on counseling and coordinating communication.  CHIEF COMPLAIN/HISTORY OF PRESENT ILLNESS:  Mirl Hillery is a 74 y.o. male with multiple medical problems including acute respiratory failure with hypoxia and hypercarbia -now resolved, patient no longer on oxygen supplementation.  Patient has hx of Prostate CA, DM HTN. Palliative Care was asked to follow this patient by consultation request of Martinique Blattenberger NP to help address advance care planning and goals of care.  HOSPICE ELIGIBILITY/DIAGNOSIS: TBD  PAST MEDICAL HISTORY:  Past Medical History:  Diagnosis Date   Arthritis    trouble turning head side to side and up   Diabetes mellitus without complication ([facility])    Hemorrhoids    High cholesterol    Hypertension    Prostate cancer ([facility])     SOCIAL HX:  Social History   Tobacco Use   Smoking status: Former Smoker    Packs/day: 0.25    Years: 4.00    Pack years: 1.00    Types: Cigarettes    Quit date: 12/16/1978    Years since quitting: 41.5   Smokeless tobacco: Never Used  Substance Use Topics   Alcohol use: Not Currently    Comment: remote h/o heavy use    ALLERGIES: No Known Allergies   PERTINENT MEDICATIONS:  Outpatient Encounter Medications as of 06/30/2020  Medication Sig   acetaminophen (TYLENOL) 325 MG tablet Take 2 tablets (650 mg total) by mouth every 6 (six) hours as needed for mild pain, moderate pain or headache.   apixaban (ELIQUIS) 5 MG TABS tablet Take 1 tablet (5 mg  total) by mouth 2 (two) times daily.   atorvastatin (LIPITOR) 10 MG tablet Take 10 mg by mouth daily.    Cholecalciferol (VITAMIN D3) 25 MCG (1000 UT) CAPS Take 1,000 Units by mouth daily.    diltiazem  (CARDIZEM) 60 MG tablet Take 1 tablet (60 mg total) by mouth 3 (three) times daily.   feeding supplement (ENSURE ENLIVE / ENSURE PLUS) LIQD Take 237 mLs by mouth 2 (two) times daily between meals.   furosemide (LASIX) 40 MG tablet Take 0.5 tablets (20 mg total) by mouth daily. fluid   insulin glargine (LANTUS) 100 UNIT/ML injection Inject 0.1 mLs (10 Units total) into the skin daily.   levalbuterol (XOPENEX) 1.25 MG/0.5ML nebulizer solution Take 0.63 mg by nebulization every 6 (six) hours as needed for wheezing or shortness of breath.   Maltodextrin-Xanthan Gum (RESOURCE THICKENUP CLEAR) POWD Oral, As needed   metFORMIN (GLUCOPHAGE-XR) 500 MG 24 hr tablet Take 500 mg by mouth at bedtime.    Multiple Vitamins-Minerals (MULTIVITAMIN ADULTS) TABS Take 1 tablet by mouth daily.   ondansetron (ZOFRAN) 4 MG tablet Take 4 mg by mouth every 8 (eight) hours as needed for nausea or vomiting.   pantoprazole (PROTONIX) 40 MG tablet Take 1 tablet (40 mg total) by mouth 2 (two) times daily.   potassium chloride 20 MEQ TBCR Take 20 mEq by mouth daily.   Semaglutide (OZEMPIC, 1 MG/DOSE, [place]) Inject 1 mg into the skin once a week. Wed   No facility-administered encounter medications on file as of 06/30/2020.    PHYSICAL EXAM/ROS:  General: In no acute distress, cooperative Cardiovascular: regular rate and rhythm, no chest pain reported Pulmonary: no cough, no shortness of breath, clear ant/post fields, normal respiratory effort on room air Abdomen: soft, non tender, positive bowel sounds in all quadrants GU: denies dysuria, no suprapubic tenderness Extremities: no edema, no joint deformities Skin: Decubitus ulcer of sacral region, unstageable; followed by wound nurse Neurological: Weakness but otherwise non focal  Note:  Portions of this note were generated with Dragon dictation software. Dictation errors may occur despite attempts at proofreading.  Teodoro Spray, NP

## 2020-07-03 ENCOUNTER — Encounter: Payer: Self-pay | Admitting: Pulmonary Disease

## 2020-07-03 ENCOUNTER — Other Ambulatory Visit: Payer: Self-pay

## 2020-07-03 ENCOUNTER — Ambulatory Visit (INDEPENDENT_AMBULATORY_CARE_PROVIDER_SITE_OTHER): Payer: Medicare HMO | Admitting: Pulmonary Disease

## 2020-07-03 ENCOUNTER — Ambulatory Visit (INDEPENDENT_AMBULATORY_CARE_PROVIDER_SITE_OTHER): Payer: Medicare HMO

## 2020-07-03 VITALS — BP 106/68 | HR 107 | Temp 97.3°F | Ht 79.0 in | Wt 200.0 lb

## 2020-07-03 DIAGNOSIS — J9601 Acute respiratory failure with hypoxia: Secondary | ICD-10-CM

## 2020-07-03 DIAGNOSIS — I8290 Acute embolism and thrombosis of unspecified vein: Secondary | ICD-10-CM | POA: Diagnosis not present

## 2020-07-03 DIAGNOSIS — R0609 Other forms of dyspnea: Secondary | ICD-10-CM | POA: Diagnosis not present

## 2020-07-03 DIAGNOSIS — Z9189 Other specified personal risk factors, not elsewhere classified: Secondary | ICD-10-CM | POA: Diagnosis not present

## 2020-07-03 DIAGNOSIS — R0602 Shortness of breath: Secondary | ICD-10-CM | POA: Diagnosis not present

## 2020-07-03 DIAGNOSIS — J9602 Acute respiratory failure with hypercapnia: Secondary | ICD-10-CM

## 2020-07-03 DIAGNOSIS — Z87891 Personal history of nicotine dependence: Secondary | ICD-10-CM | POA: Diagnosis not present

## 2020-07-03 NOTE — Progress Notes (Signed)
@Patient  ID: Jay Holloway, male    DOB: 03-18-1946, 74 y.o.   MRN: [id number]  Chief Complaint  Patient presents with  . Hospitalization Follow-up    no complaints     Referring provider: [name], MD  HPI:  74 year old male former smoker initially consulted with our practice on 04/28/2020 when hospitalized for hypercarbia.  Felt to have suspected COPD and/or obstructive sleep apnea.  Found to have acute PE on 04/30/20  PMH: Chronic kidney disease, type 2 diabetes, prostate cancer, GERD, dysphagia, CHF Smoker/ Smoking History: Former smoker Maintenance:   Pt of: Dr. Jenetta Downer  07/03/2020  - Visit   74 year old male former smoker initially consulted with our practice when hospitalized on 04/28/2020.  For acute on chronic respiratory failure.  Patient was discharged on 05/05/2020  Unfortunately patient was hospitalized again on 05/10/2020.  And discharged on 05/19/2020.  He was evaluated by Dr. Vaughan Browner in the second hospitalization last seen by our team on 10/15 by/2021 by Dr. Vaughan Browner.  Recommendations and that plan of care were for the patient obtain an outpatient sleep study as well as pulmonary function testing.  He was felt to potentially have undiagnosed obstructive sleep apnea.  As well as recorded COPD diagnosis per wife but does not have significant smoking history.  Patient presenting to office today reporting no acute concerns with his breathing.  He has multiple acute issues that he is working with as far as for healing pressure ulcers and lower extremity swelling.  He is currently living at [facility] care center.  Patient has no acute respiratory concerns or sleep concerns at this time.   Questionaires / Pulmonary Flowsheets:   ACT:  No flowsheet data found.  MMRC: No flowsheet data found.  Epworth:  No flowsheet data found.  Tests:   04/30/2020-CTA chest-small filling defect is noted in the lower lobe branch of left pulmonary artery consistent with acute PE, small  right pleural effusion  05/15/2020-chest x-ray-pulmonary hypoinflation   FENO:  No results found for: NITRICOXIDE  PFT: No flowsheet data found.  WALK:  No flowsheet data found.  Imaging: No results found.  Lab Results:  CBC    Component Value Date/Time   WBC 7.9 05/16/2020 0313   RBC 3.32 (L) 05/16/2020 0313   HGB 9.6 (L) 05/16/2020 0313   HCT 29.1 (L) 05/16/2020 0313   PLT 414 (H) 05/16/2020 0313   MCV 87.7 05/16/2020 0313   MCH 28.9 05/16/2020 0313   MCHC 33.0 05/16/2020 0313   RDW 15.9 (H) 05/16/2020 0313   LYMPHSABS 2.7 05/10/2020 1945   MONOABS 0.3 05/10/2020 1945   EOSABS 0.0 05/10/2020 1945   BASOSABS 0.0 05/10/2020 1945    BMET    Component Value Date/Time   NA 140 05/18/2020 0513   K 3.1 (L) 05/18/2020 0513   CL 93 (L) 05/18/2020 0513   CO2 38 (H) 05/18/2020 0513   GLUCOSE 125 (H) 05/18/2020 0513   BUN 19 05/18/2020 0513   CREATININE 1.31 (H) 05/18/2020 0513   CALCIUM 8.4 (L) 05/18/2020 0513   GFRNONAA 54 (L) 05/18/2020 0513   GFRAA >60 05/02/2020 0404    BNP    Component Value Date/Time   BNP 140.6 (H) 05/10/2020 1945    ProBNP No results found for: PROBNP  Specialty Problems      Pulmonary Problems   Acute respiratory failure with hypoxia and hypercarbia (HCC)   Acute respiratory failure (HCC)   Acute respiratory failure with hypoxia and hypercapnia (HCC)   Shortness  of breath      No Known Allergies  Immunization History  Administered Date(s) Administered  . Influenza Split 06/05/2012  . Influenza, High Dose Seasonal PF 06/24/2018  . PFIZER SARS-COV-2 Vaccination 09/27/2019, 10/20/2019    Past Medical History:  Diagnosis Date  . Arthritis    trouble turning head side to side and up  . Diabetes mellitus without complication ([place])   . Hemorrhoids   . High cholesterol   . Hypertension   . Prostate cancer ([place])     Tobacco History: Social History   Tobacco Use  Smoking Status Former Smoker  . Packs/day: 0.25  .  Years: 4.00  . Pack years: 1.00  . Types: Cigarettes  . Quit date: 12/16/1978  . Years since quitting: 41.5  Smokeless Tobacco Never Used   Counseling given: Not Answered   Continue to not smoke  Outpatient Encounter Medications as of 07/03/2020  Medication Sig  . acetaminophen (TYLENOL) 325 MG tablet Take 2 tablets (650 mg total) by mouth every 6 (six) hours as needed for mild pain, moderate pain or headache.  Marland Kitchen atorvastatin (LIPITOR) 10 MG tablet Take 10 mg by mouth daily.   . Cholecalciferol (VITAMIN D3) 25 MCG (1000 UT) CAPS Take 1,000 Units by mouth daily.   Marland Kitchen diltiazem (CARDIZEM) 60 MG tablet Take 1 tablet (60 mg total) by mouth 3 (three) times daily.  . furosemide (LASIX) 40 MG tablet Take 0.5 tablets (20 mg total) by mouth daily. fluid  . insulin glargine (LANTUS) 100 UNIT/ML injection Inject 0.1 mLs (10 Units total) into the skin daily.  Marland Kitchen levalbuterol (XOPENEX) 1.25 MG/0.5ML nebulizer solution Take 0.63 mg by nebulization every 6 (six) hours as needed for wheezing or shortness of breath.  . Maltodextrin-Xanthan Gum (RESOURCE THICKENUP CLEAR) POWD Oral, As needed  . metFORMIN (GLUCOPHAGE-XR) 500 MG 24 hr tablet Take 500 mg by mouth at bedtime.   . Multiple Vitamins-Minerals (MULTIVITAMIN ADULTS) TABS Take 1 tablet by mouth daily.  . ondansetron (ZOFRAN) 4 MG tablet Take 4 mg by mouth every 8 (eight) hours as needed for nausea or vomiting.  . pantoprazole (PROTONIX) 40 MG tablet Take 1 tablet (40 mg total) by mouth 2 (two) times daily.  . Semaglutide (OZEMPIC, 1 MG/DOSE, [place]) Inject 1 mg into the skin once a week. Wed  . apixaban (ELIQUIS) 5 MG TABS tablet Take 1 tablet (5 mg total) by mouth 2 (two) times daily.  . feeding supplement (ENSURE ENLIVE / ENSURE PLUS) LIQD Take 237 mLs by mouth 2 (two) times daily between meals. (Patient not taking: Reported on 07/03/2020)  . potassium chloride 20 MEQ TBCR Take 20 mEq by mouth daily.   No facility-administered encounter medications on  file as of 07/03/2020.     Review of Systems  Review of Systems  Constitutional: Negative for activity change, chills, fatigue, fever and unexpected weight change.  HENT: Negative for postnasal drip, rhinorrhea, sinus pressure, sinus pain and sore throat.   Eyes: Negative.   Respiratory: Negative for cough, shortness of breath and wheezing.   Cardiovascular: Positive for leg swelling. Negative for chest pain and palpitations.  Gastrointestinal: Negative for constipation, diarrhea, nausea and vomiting.  Endocrine: Negative.   Genitourinary: Negative.   Musculoskeletal: Negative.   Skin: Positive for rash and wound.       Multiple skin wounds   Neurological: Negative for dizziness and headaches.  Psychiatric/Behavioral: Negative.  Negative for dysphoric mood. The patient is not nervous/anxious.   All other systems reviewed and are  negative.    Physical Exam  BP 106/68 (BP Location: Left Arm, Cuff Size: Normal)   Pulse (!) 107   Temp (!) 97.3 F (36.3 C)   Ht 6\' 7"  (2.007 m)   Wt 200 lb (90.7 kg)   SpO2 97%   BMI 22.53 kg/m   Wt Readings from Last 5 Encounters:  07/03/20 200 lb (90.7 kg)  05/10/20 207 lb 14.3 oz (94.3 kg)  04/28/20 212 lb 8.4 oz (96.4 kg)  04/17/20 212 lb 8.4 oz (96.4 kg)  03/24/20 208 lb 15.9 oz (94.8 kg)    BMI Readings from Last 5 Encounters:  07/03/20 22.53 kg/m  05/10/20 28.20 kg/m  04/28/20 28.82 kg/m  04/17/20 28.82 kg/m  03/24/20 24.78 kg/m     Physical Exam Vitals and nursing note reviewed.  Constitutional:      General: He is not in acute distress.    Appearance: Normal appearance. He is normal weight.  HENT:     Head: Normocephalic and atraumatic.     Right Ear: Hearing and external ear normal.     Left Ear: Hearing and external ear normal.     Nose: Nose normal. No mucosal edema or rhinorrhea.     Right Turbinates: Not enlarged.     Left Turbinates: Not enlarged.     Mouth/Throat:     Mouth: Mucous membranes are dry.      Pharynx: Oropharynx is clear. No oropharyngeal exudate.  Eyes:     Pupils: Pupils are equal, round, and reactive to light.  Cardiovascular:     Rate and Rhythm: Normal rate and regular rhythm.     Pulses: Normal pulses.     Heart sounds: Normal heart sounds. No murmur heard.   Pulmonary:     Effort: Pulmonary effort is normal.     Breath sounds: Examination of the right-lower field reveals decreased breath sounds. Decreased breath sounds present. No wheezing or rales.  Musculoskeletal:        General: Swelling present.     Cervical back: Normal range of motion.     Right lower leg: 3+ Pitting Edema present.     Left lower leg: 3+ Pitting Edema present.  Lymphadenopathy:     Cervical: No cervical adenopathy.  Skin:    Capillary Refill: Capillary refill takes less than 2 seconds.     Comments: Patient with multiple pressure ulcers.  Not able to evaluate as patient has clothing on he is sitting in a wheelchair.  Followed by primary care.  Neurological:     General: No focal deficit present.     Mental Status: He is alert and oriented to person, place, and time.     Motor: No weakness.     Coordination: Coordination normal.     Gait: Gait is intact. Gait normal.  Psychiatric:        Mood and Affect: Mood normal.        Behavior: Behavior normal. Behavior is cooperative.        Thought Content: Thought content normal.        Judgment: Judgment normal.       Assessment & Plan:   VTE (venous thromboembolism) Plan: Continue Eliquis  Acute respiratory failure ([place]) Evaluated in the hospital in October/2021 Patient felt to potentially be at risk for obstructive sleep apnea as well as needing pulmonary function testing  Discussion: Patient declines sleep study at this time as well as pulmonary function testing.  He would like to prioritize returning to baseline,  getting discharged from the care center he is living at, obtaining better management of his pressure  ulcers  Plan: Chest x-ray today We will defer sleep study and pulmonary function testing at patient's request today 71-month follow-up to establish care with Dr. Jenetta Downer and 30-minute time slot  At risk for obstructive sleep apnea Evaluated in the hospital in October/2021 Patient felt to potentially be at risk for obstructive sleep apnea as well as needing pulmonary function testing  Discussion: Patient declines sleep study at this time as well as pulmonary function testing.  He would like to prioritize returning to baseline, getting discharged from the care center he is living at, obtaining better management of his pressure ulcers  Plan: Chest x-ray today We will defer sleep study and pulmonary function testing at patient's request today 76-month follow-up to establish care with Dr. Jenetta Downer and 30-minute time slot  Former smoker  Plan: Chest x-ray today We will defer sleep study and pulmonary function testing at patient's request today 89-month follow-up to establish care with Dr. Jenetta Downer and 30-minute time slot  Shortness of breath  Plan: Chest x-ray today We will defer sleep study and pulmonary function testing at patient's request today 22-month follow-up to establish care with Dr. Jenetta Downer and 30-minute time slot    Return in about 3 months (around 10/01/2020), or if symptoms worsen or fail to improve, for Follow up with Dr. Ander Slade, [place].   Lauraine Rinne, NP 07/03/2020   This appointment required 34 minutes of patient care (this includes precharting, chart review, review of results, face-to-face care, etc.).

## 2020-07-03 NOTE — Assessment & Plan Note (Signed)
Evaluated in the hospital in October/2021 Patient felt to potentially be at risk for obstructive sleep apnea as well as needing pulmonary function testing  Discussion: Patient declines sleep study at this time as well as pulmonary function testing.  He would like to prioritize returning to baseline, getting discharged from the care center he is living at, obtaining better management of his pressure ulcers  Plan: Chest x-ray today We will defer sleep study and pulmonary function testing at patient's request today 33-month follow-up to establish care with Dr. Jenetta Downer and 30-minute time slot

## 2020-07-03 NOTE — Assessment & Plan Note (Signed)
  Plan: Chest x-ray today We will defer sleep study and pulmonary function testing at patient's request today 32-month follow-up to establish care with Dr. Jenetta Downer and 30-minute time slot

## 2020-07-03 NOTE — Assessment & Plan Note (Signed)
  Plan: Chest x-ray today We will defer sleep study and pulmonary function testing at patient's request today 45-month follow-up to establish care with Dr. Jenetta Downer and 30-minute time slot

## 2020-07-03 NOTE — Patient Instructions (Addendum)
You were seen today by Lauraine Rinne, NP  for:   It is a pleasure meeting you.  I am glad that you are doing better since getting on the hospital.  I can respect your wishes to delay the pulmonary function testing and sleep study at this time due to your acute issues with your healing skin wounds as well as lower extremity swelling.  Keep follow-up with your primary care as well as specialty team.  We will have you establish with Dr. Ander Slade who saw you in the hospital in October/2021.  If you become interested in obtaining a breathing test as well as a sleep study please let us know and we can place these orders.  We will get a chest x-ray on you in our office today  Take care of yourself and stay safe.  Have a very happy holidays,  Aaron Edelman  1. Shortness of breath  - DG Chest 2 View; Future  Chest x-ray today to further evaluate if we continue to have a right pleural effusion  2. Former smoker  Recommend obtaining pulmonary function testing to further evaluate your smoking history  3. VTE (venous thromboembolism)  Continue Eliquis  4. At risk for obstructive sleep apnea 5. Acute respiratory failure with hypoxia and hypercapnia (HCC)  Recommend inpatient split-night sleep study to further evaluate if you have obstructive sleep apnea    We recommend today:  Orders Placed This Encounter  Procedures  . DG Chest 2 View    Standing Status:   Future    Number of Occurrences:   1    Standing Expiration Date:   11/01/2020    Order Specific Question:   Reason for Exam (SYMPTOM  OR DIAGNOSIS REQUIRED)    Answer:   doe    Order Specific Question:   Preferred imaging location?    Answer:   Internal    Order Specific Question:   Radiology Contrast Protocol - do NOT remove file path    Answer:   [email or web address]   Orders Placed This Encounter  Procedures  . DG Chest 2 View   No orders of the defined types were placed in this  encounter.   Follow Up:    Return in about 3 months (around 10/01/2020), or if symptoms worsen or fail to improve, for Follow up with Dr. Ander Slade, [place].   Notification of test results are managed in the following manner: If there are  any recommendations or changes to the  plan of care discussed in office today,  we will contact you and let you know what they are. If you do not hear from [place], then your results are normal and you can view them through your  MyChart account , or a letter will be sent to you. Thank you again for trusting [place] with your care  - Thank you, [facility]    It is flu season:   >>> Best ways to protect herself from the flu: Receive the yearly flu vaccine, practice good hand hygiene washing with soap and also using hand sanitizer when available, eat a nutritious meals, get adequate rest, hydrate appropriately       Please contact the office if your symptoms worsen or you have concerns that you are not improving.   Thank you for choosing  [facility] for your healthcare, and for allowing [place] to partner with you on your healthcare journey. I am thankful to be able to provide care to you today.   Aaron Edelman  [facility] FNP-C

## 2020-07-03 NOTE — Assessment & Plan Note (Signed)
Evaluated in the hospital in October/2021 Patient felt to potentially be at risk for obstructive sleep apnea as well as needing pulmonary function testing  Discussion: Patient declines sleep study at this time as well as pulmonary function testing.  He would like to prioritize returning to baseline, getting discharged from the care center he is living at, obtaining better management of his pressure ulcers  Plan: Chest x-ray today We will defer sleep study and pulmonary function testing at patient's request today 24-month follow-up to establish care with Dr. Jenetta Downer and 30-minute time slot

## 2020-07-03 NOTE — Assessment & Plan Note (Signed)
Plan: Continue Eliquis 

## 2020-07-05 ENCOUNTER — Encounter: Payer: Self-pay | Admitting: *Deleted

## 2020-07-06 DIAGNOSIS — L98419 Non-pressure chronic ulcer of buttock with unspecified severity: Secondary | ICD-10-CM | POA: Diagnosis not present

## 2020-07-13 DIAGNOSIS — L98419 Non-pressure chronic ulcer of buttock with unspecified severity: Secondary | ICD-10-CM | POA: Diagnosis not present

## 2020-07-20 DIAGNOSIS — L89154 Pressure ulcer of sacral region, stage 4: Secondary | ICD-10-CM | POA: Diagnosis not present

## 2020-07-22 ENCOUNTER — Emergency Department (HOSPITAL_COMMUNITY): Payer: Medicare HMO

## 2020-07-22 ENCOUNTER — Encounter (HOSPITAL_COMMUNITY): Payer: Self-pay | Admitting: Internal Medicine

## 2020-07-22 ENCOUNTER — Inpatient Hospital Stay (HOSPITAL_COMMUNITY)
Admission: EM | Admit: 2020-07-22 | Discharge: 2020-07-31 | DRG: 871 | Disposition: A | Discharge status: Skilled Nursing Facility | Payer: Medicare HMO | Source: Skilled Nursing Facility | Attending: Internal Medicine | Admitting: Internal Medicine

## 2020-07-22 DIAGNOSIS — Z8546 Personal history of malignant neoplasm of prostate: Secondary | ICD-10-CM | POA: Diagnosis not present

## 2020-07-22 DIAGNOSIS — R1310 Dysphagia, unspecified: Secondary | ICD-10-CM | POA: Diagnosis present

## 2020-07-22 DIAGNOSIS — I10 Essential (primary) hypertension: Secondary | ICD-10-CM | POA: Diagnosis not present

## 2020-07-22 DIAGNOSIS — I472 Ventricular tachycardia, unspecified: Secondary | ICD-10-CM

## 2020-07-22 DIAGNOSIS — F0390 Unspecified dementia without behavioral disturbance: Secondary | ICD-10-CM | POA: Diagnosis present

## 2020-07-22 DIAGNOSIS — E1165 Type 2 diabetes mellitus with hyperglycemia: Secondary | ICD-10-CM | POA: Diagnosis not present

## 2020-07-22 DIAGNOSIS — L89154 Pressure ulcer of sacral region, stage 4: Secondary | ICD-10-CM | POA: Diagnosis not present

## 2020-07-22 DIAGNOSIS — Z20822 Contact with and (suspected) exposure to covid-19: Secondary | ICD-10-CM | POA: Diagnosis not present

## 2020-07-22 DIAGNOSIS — J9622 Acute and chronic respiratory failure with hypercapnia: Secondary | ICD-10-CM | POA: Diagnosis present

## 2020-07-22 DIAGNOSIS — J90 Pleural effusion, not elsewhere classified: Secondary | ICD-10-CM | POA: Diagnosis not present

## 2020-07-22 DIAGNOSIS — I452 Bifascicular block: Secondary | ICD-10-CM | POA: Diagnosis present

## 2020-07-22 DIAGNOSIS — J9621 Acute and chronic respiratory failure with hypoxia: Secondary | ICD-10-CM | POA: Diagnosis present

## 2020-07-22 DIAGNOSIS — E7800 Pure hypercholesterolemia, unspecified: Secondary | ICD-10-CM | POA: Diagnosis present

## 2020-07-22 DIAGNOSIS — Z794 Long term (current) use of insulin: Secondary | ICD-10-CM

## 2020-07-22 DIAGNOSIS — J690 Pneumonitis due to inhalation of food and vomit: Secondary | ICD-10-CM | POA: Diagnosis not present

## 2020-07-22 DIAGNOSIS — E1122 Type 2 diabetes mellitus with diabetic chronic kidney disease: Secondary | ICD-10-CM | POA: Diagnosis present

## 2020-07-22 DIAGNOSIS — I2699 Other pulmonary embolism without acute cor pulmonale: Secondary | ICD-10-CM | POA: Insufficient documentation

## 2020-07-22 DIAGNOSIS — J9602 Acute respiratory failure with hypercapnia: Secondary | ICD-10-CM

## 2020-07-22 DIAGNOSIS — M1990 Unspecified osteoarthritis, unspecified site: Secondary | ICD-10-CM | POA: Diagnosis present

## 2020-07-22 DIAGNOSIS — R0602 Shortness of breath: Secondary | ICD-10-CM | POA: Diagnosis not present

## 2020-07-22 DIAGNOSIS — E1169 Type 2 diabetes mellitus with other specified complication: Secondary | ICD-10-CM | POA: Diagnosis present

## 2020-07-22 DIAGNOSIS — I5033 Acute on chronic diastolic (congestive) heart failure: Secondary | ICD-10-CM | POA: Diagnosis not present

## 2020-07-22 DIAGNOSIS — Z66 Do not resuscitate: Secondary | ICD-10-CM | POA: Diagnosis not present

## 2020-07-22 DIAGNOSIS — Z515 Encounter for palliative care: Secondary | ICD-10-CM

## 2020-07-22 DIAGNOSIS — Z9079 Acquired absence of other genital organ(s): Secondary | ICD-10-CM

## 2020-07-22 DIAGNOSIS — R0902 Hypoxemia: Secondary | ICD-10-CM | POA: Diagnosis not present

## 2020-07-22 DIAGNOSIS — R410 Disorientation, unspecified: Secondary | ICD-10-CM | POA: Diagnosis not present

## 2020-07-22 DIAGNOSIS — I8290 Acute embolism and thrombosis of unspecified vein: Secondary | ICD-10-CM | POA: Diagnosis not present

## 2020-07-22 DIAGNOSIS — I5031 Acute diastolic (congestive) heart failure: Secondary | ICD-10-CM | POA: Diagnosis not present

## 2020-07-22 DIAGNOSIS — N179 Acute kidney failure, unspecified: Secondary | ICD-10-CM | POA: Diagnosis not present

## 2020-07-22 DIAGNOSIS — Z6824 Body mass index (BMI) 24.0-24.9, adult: Secondary | ICD-10-CM

## 2020-07-22 DIAGNOSIS — E785 Hyperlipidemia, unspecified: Secondary | ICD-10-CM | POA: Diagnosis present

## 2020-07-22 DIAGNOSIS — R092 Respiratory arrest: Secondary | ICD-10-CM | POA: Diagnosis not present

## 2020-07-22 DIAGNOSIS — I130 Hypertensive heart and chronic kidney disease with heart failure and stage 1 through stage 4 chronic kidney disease, or unspecified chronic kidney disease: Secondary | ICD-10-CM | POA: Diagnosis present

## 2020-07-22 DIAGNOSIS — L8990 Pressure ulcer of unspecified site, unspecified stage: Secondary | ICD-10-CM | POA: Diagnosis present

## 2020-07-22 DIAGNOSIS — I4510 Unspecified right bundle-branch block: Secondary | ICD-10-CM | POA: Diagnosis not present

## 2020-07-22 DIAGNOSIS — Z87891 Personal history of nicotine dependence: Secondary | ICD-10-CM

## 2020-07-22 DIAGNOSIS — R4020 Unspecified coma: Secondary | ICD-10-CM | POA: Diagnosis not present

## 2020-07-22 DIAGNOSIS — J9601 Acute respiratory failure with hypoxia: Secondary | ICD-10-CM | POA: Diagnosis present

## 2020-07-22 DIAGNOSIS — R4189 Other symptoms and signs involving cognitive functions and awareness: Secondary | ICD-10-CM | POA: Diagnosis not present

## 2020-07-22 DIAGNOSIS — M2550 Pain in unspecified joint: Secondary | ICD-10-CM | POA: Diagnosis not present

## 2020-07-22 DIAGNOSIS — R0989 Other specified symptoms and signs involving the circulatory and respiratory systems: Secondary | ICD-10-CM | POA: Diagnosis not present

## 2020-07-22 DIAGNOSIS — J189 Pneumonia, unspecified organism: Secondary | ICD-10-CM | POA: Diagnosis not present

## 2020-07-22 DIAGNOSIS — E43 Unspecified severe protein-calorie malnutrition: Secondary | ICD-10-CM | POA: Diagnosis present

## 2020-07-22 DIAGNOSIS — Z79899 Other long term (current) drug therapy: Secondary | ICD-10-CM

## 2020-07-22 DIAGNOSIS — I454 Nonspecific intraventricular block: Secondary | ICD-10-CM | POA: Diagnosis not present

## 2020-07-22 DIAGNOSIS — N1831 Chronic kidney disease, stage 3a: Secondary | ICD-10-CM | POA: Diagnosis not present

## 2020-07-22 DIAGNOSIS — E1129 Type 2 diabetes mellitus with other diabetic kidney complication: Secondary | ICD-10-CM | POA: Diagnosis present

## 2020-07-22 DIAGNOSIS — J9600 Acute respiratory failure, unspecified whether with hypoxia or hypercapnia: Secondary | ICD-10-CM | POA: Diagnosis not present

## 2020-07-22 DIAGNOSIS — R6520 Severe sepsis without septic shock: Secondary | ICD-10-CM | POA: Diagnosis present

## 2020-07-22 DIAGNOSIS — I5032 Chronic diastolic (congestive) heart failure: Secondary | ICD-10-CM | POA: Diagnosis not present

## 2020-07-22 DIAGNOSIS — Z7401 Bed confinement status: Secondary | ICD-10-CM | POA: Diagnosis not present

## 2020-07-22 DIAGNOSIS — Z86718 Personal history of other venous thrombosis and embolism: Secondary | ICD-10-CM

## 2020-07-22 DIAGNOSIS — R5381 Other malaise: Secondary | ICD-10-CM | POA: Diagnosis not present

## 2020-07-22 DIAGNOSIS — R404 Transient alteration of awareness: Secondary | ICD-10-CM | POA: Diagnosis not present

## 2020-07-22 DIAGNOSIS — I6782 Cerebral ischemia: Secondary | ICD-10-CM | POA: Diagnosis not present

## 2020-07-22 DIAGNOSIS — N183 Chronic kidney disease, stage 3 unspecified: Secondary | ICD-10-CM | POA: Diagnosis present

## 2020-07-22 DIAGNOSIS — N1832 Chronic kidney disease, stage 3b: Secondary | ICD-10-CM | POA: Diagnosis not present

## 2020-07-22 DIAGNOSIS — K219 Gastro-esophageal reflux disease without esophagitis: Secondary | ICD-10-CM | POA: Diagnosis present

## 2020-07-22 DIAGNOSIS — A419 Sepsis, unspecified organism: Principal | ICD-10-CM | POA: Diagnosis present

## 2020-07-22 LAB — I-STAT VENOUS BLOOD GAS, ED
Acid-Base Excess: 9 mmol/L — ABNORMAL HIGH (ref 0.0–2.0)
Bicarbonate: 37.9 mmol/L — ABNORMAL HIGH (ref 20.0–28.0)
Calcium, Ion: 1.1 mmol/L — ABNORMAL LOW (ref 1.15–1.40)
HCT: 37 % — ABNORMAL LOW (ref 39.0–52.0)
Hemoglobin: 12.6 g/dL — ABNORMAL LOW (ref 13.0–17.0)
O2 Saturation: 93 %
Potassium: 4.6 mmol/L (ref 3.5–5.1)
Sodium: 136 mmol/L (ref 135–145)
TCO2: 40 mmol/L — ABNORMAL HIGH (ref 22–32)
pCO2, Ven: 75.5 mmHg (ref 44.0–60.0)
pH, Ven: 7.309 (ref 7.250–7.430)
pO2, Ven: 79 mmHg — ABNORMAL HIGH (ref 32.0–45.0)

## 2020-07-22 LAB — CBC WITH DIFFERENTIAL/PLATELET
Abs Immature Granulocytes: 0.07 10*3/uL (ref 0.00–0.07)
Basophils Absolute: 0 10*3/uL (ref 0.0–0.1)
Basophils Relative: 0 %
Eosinophils Absolute: 0 10*3/uL (ref 0.0–0.5)
Eosinophils Relative: 0 %
HCT: 38.1 % — ABNORMAL LOW (ref 39.0–52.0)
Hemoglobin: 10.9 g/dL — ABNORMAL LOW (ref 13.0–17.0)
Immature Granulocytes: 1 %
Lymphocytes Relative: 36 %
Lymphs Abs: 4 10*3/uL (ref 0.7–4.0)
MCH: 27 pg (ref 26.0–34.0)
MCHC: 28.6 g/dL — ABNORMAL LOW (ref 30.0–36.0)
MCV: 94.5 fL (ref 80.0–100.0)
Monocytes Absolute: 0.5 10*3/uL (ref 0.1–1.0)
Monocytes Relative: 4 %
Neutro Abs: 6.5 10*3/uL (ref 1.7–7.7)
Neutrophils Relative %: 59 %
Platelets: 401 10*3/uL — ABNORMAL HIGH (ref 150–400)
RBC: 4.03 MIL/uL — ABNORMAL LOW (ref 4.22–5.81)
RDW: 16.4 % — ABNORMAL HIGH (ref 11.5–15.5)
WBC: 11.1 10*3/uL — ABNORMAL HIGH (ref 4.0–10.5)
nRBC: 0 % (ref 0.0–0.2)

## 2020-07-22 LAB — COMPREHENSIVE METABOLIC PANEL
ALT: 45 U/L — ABNORMAL HIGH (ref 0–44)
AST: 36 U/L (ref 15–41)
Albumin: 2.4 g/dL — ABNORMAL LOW (ref 3.5–5.0)
Alkaline Phosphatase: 116 U/L (ref 38–126)
Anion gap: 9 (ref 5–15)
BUN: 30 mg/dL — ABNORMAL HIGH (ref 8–23)
CO2: 32 mmol/L (ref 22–32)
Calcium: 8.5 mg/dL — ABNORMAL LOW (ref 8.9–10.3)
Chloride: 97 mmol/L — ABNORMAL LOW (ref 98–111)
Creatinine, Ser: 1.41 mg/dL — ABNORMAL HIGH (ref 0.61–1.24)
GFR, Estimated: 53 mL/min — ABNORMAL LOW (ref >60)
Glucose, Bld: 200 mg/dL — ABNORMAL HIGH (ref 70–99)
Potassium: 4.8 mmol/L (ref 3.5–5.1)
Sodium: 138 mmol/L (ref 135–145)
Total Bilirubin: 0.5 mg/dL (ref 0.3–1.2)
Total Protein: 5.7 g/dL — ABNORMAL LOW (ref 6.5–8.1)

## 2020-07-22 LAB — I-STAT CREATININE, ED: Creatinine, Ser: 1.4 mg/dL — ABNORMAL HIGH (ref 0.61–1.24)

## 2020-07-22 LAB — BRAIN NATRIURETIC PEPTIDE: B Natriuretic Peptide: 222.2 pg/mL — ABNORMAL HIGH (ref 0.0–100.0)

## 2020-07-22 LAB — RESP PANEL BY RT-PCR (FLU A&B, COVID) ARPGX2
Influenza A by PCR: NEGATIVE
Influenza B by PCR: NEGATIVE
SARS Coronavirus 2 by RT PCR: NEGATIVE

## 2020-07-22 LAB — TROPONIN I (HIGH SENSITIVITY)
Troponin I (High Sensitivity): 13 ng/L (ref <18)
Troponin I (High Sensitivity): 12 ng/L (ref <18)

## 2020-07-22 LAB — CBG MONITORING, ED
Glucose-Capillary: 188 mg/dL — ABNORMAL HIGH (ref 70–99)
Glucose-Capillary: 169 mg/dL — ABNORMAL HIGH (ref 70–99)

## 2020-07-22 LAB — PROCALCITONIN: Procalcitonin: 0.15 ng/mL

## 2020-07-22 LAB — LACTIC ACID, PLASMA: Lactic Acid, Venous: 1.9 mmol/L (ref 0.5–1.9)

## 2020-07-22 MED ORDER — INSULIN ASPART 100 UNIT/ML ~~LOC~~ SOLN
0.0000 [IU] | Freq: Every day | SUBCUTANEOUS | Status: DC
  Administered 2020-07-28 – 2020-07-29 (×2): 2 [IU] via SUBCUTANEOUS

## 2020-07-22 MED ORDER — ATORVASTATIN CALCIUM 10 MG PO TABS
10.0000 mg | ORAL_TABLET | Freq: Every day | ORAL | Status: DC
  Administered 2020-07-23 – 2020-07-31 (×8): 10 mg via ORAL
  Filled 2020-07-22 (×10): qty 1

## 2020-07-22 MED ORDER — VITAMIN D 25 MCG (1000 UNIT) PO TABS
1000.0000 [IU] | ORAL_TABLET | Freq: Every day | ORAL | Status: DC
  Administered 2020-07-23 – 2020-07-31 (×8): 1000 [IU] via ORAL
  Filled 2020-07-22 (×10): qty 1

## 2020-07-22 MED ORDER — PIPERACILLIN-TAZOBACTAM 3.375 G IVPB 30 MIN
3.3750 g | Freq: Once | INTRAVENOUS | Status: AC
  Administered 2020-07-22: 20:00:00 3.375 g via INTRAVENOUS
  Filled 2020-07-22: qty 50

## 2020-07-22 MED ORDER — GABAPENTIN 100 MG PO CAPS
100.0000 mg | ORAL_CAPSULE | Freq: Three times a day (TID) | ORAL | Status: DC
  Administered 2020-07-23 – 2020-07-31 (×24): 100 mg via ORAL
  Filled 2020-07-22 (×26): qty 1

## 2020-07-22 MED ORDER — DM-GUAIFENESIN ER 30-600 MG PO TB12
1.0000 | ORAL_TABLET | Freq: Two times a day (BID) | ORAL | Status: DC | PRN
  Filled 2020-07-22: qty 1

## 2020-07-22 MED ORDER — MAGNESIUM OXIDE 400 (241.3 MG) MG PO TABS
400.0000 mg | ORAL_TABLET | Freq: Every day | ORAL | Status: DC
  Administered 2020-07-23 – 2020-07-31 (×8): 400 mg via ORAL
  Filled 2020-07-22 (×10): qty 1

## 2020-07-22 MED ORDER — FUROSEMIDE 20 MG PO TABS
20.0000 mg | ORAL_TABLET | Freq: Every day | ORAL | Status: DC
  Administered 2020-07-23: 09:00:00 20 mg via ORAL
  Filled 2020-07-22: qty 1

## 2020-07-22 MED ORDER — ALBUTEROL SULFATE (2.5 MG/3ML) 0.083% IN NEBU: 2.5000 mg | INHALATION_SOLUTION | RESPIRATORY_TRACT | Status: DC | PRN

## 2020-07-22 MED ORDER — INSULIN GLARGINE 100 UNIT/ML ~~LOC~~ SOLN
7.0000 [IU] | Freq: Every day | SUBCUTANEOUS | Status: DC
  Administered 2020-07-23 – 2020-07-31 (×9): 7 [IU] via SUBCUTANEOUS
  Filled 2020-07-22 (×13): qty 0.07

## 2020-07-22 MED ORDER — HYDROCODONE-ACETAMINOPHEN 5-325 MG PO TABS
1.0000 | ORAL_TABLET | Freq: Four times a day (QID) | ORAL | Status: DC | PRN
Start: 2020-07-22 — End: 2020-07-24
  Administered 2020-07-22: 1 via ORAL
  Filled 2020-07-22: qty 1

## 2020-07-22 MED ORDER — ADULT MULTIVITAMIN W/MINERALS CH
1.0000 | ORAL_TABLET | Freq: Every day | ORAL | Status: DC
  Administered 2020-07-23 – 2020-07-31 (×8): 1 via ORAL
  Filled 2020-07-22 (×10): qty 1

## 2020-07-22 MED ORDER — PIPERACILLIN-TAZOBACTAM 3.375 G IVPB
3.3750 g | Freq: Three times a day (TID) | INTRAVENOUS | Status: DC
  Administered 2020-07-23 – 2020-07-29 (×19): 3.375 g via INTRAVENOUS
  Filled 2020-07-22 (×20): qty 50

## 2020-07-22 MED ORDER — HEPARIN (PORCINE) 25000 UT/250ML-% IV SOLN
1350.0000 [IU]/h | INTRAVENOUS | Status: DC
  Administered 2020-07-22: 1200 [IU]/h via INTRAVENOUS
  Administered 2020-07-24 – 2020-07-25 (×3): 1350 [IU]/h via INTRAVENOUS
  Filled 2020-07-22 (×6): qty 250

## 2020-07-22 MED ORDER — VANCOMYCIN HCL IN DEXTROSE 1-5 GM/200ML-% IV SOLN: 1000.0000 mg | Freq: Once | INTRAVENOUS | Status: DC

## 2020-07-22 MED ORDER — ENSURE ENLIVE PO LIQD
237.0000 mL | Freq: Two times a day (BID) | ORAL | Status: DC
  Administered 2020-07-23: 18:00:00 237 mL via ORAL

## 2020-07-22 MED ORDER — ONDANSETRON HCL 4 MG/2ML IJ SOLN: 4.0000 mg | Freq: Three times a day (TID) | INTRAMUSCULAR | Status: DC | PRN

## 2020-07-22 MED ORDER — IOHEXOL 350 MG/ML SOLN
75.0000 mL | Freq: Once | INTRAVENOUS | Status: AC | PRN
  Administered 2020-07-22: 75 mL via INTRAVENOUS

## 2020-07-22 MED ORDER — HYDRALAZINE HCL 20 MG/ML IJ SOLN: 5.0000 mg | INTRAMUSCULAR | Status: DC | PRN

## 2020-07-22 MED ORDER — VANCOMYCIN HCL IN DEXTROSE 1-5 GM/200ML-% IV SOLN
1000.0000 mg | INTRAVENOUS | Status: DC
  Filled 2020-07-22: qty 200

## 2020-07-22 MED ORDER — PANTOPRAZOLE SODIUM 40 MG PO TBEC
40.0000 mg | DELAYED_RELEASE_TABLET | Freq: Two times a day (BID) | ORAL | Status: DC
  Administered 2020-07-23 – 2020-07-31 (×16): 40 mg via ORAL
  Filled 2020-07-22 (×17): qty 1

## 2020-07-22 MED ORDER — APIXABAN 5 MG PO TABS: 5.0000 mg | ORAL_TABLET | Freq: Two times a day (BID) | ORAL | Status: DC

## 2020-07-22 MED ORDER — VANCOMYCIN HCL 1500 MG/300ML IV SOLN
1500.0000 mg | Freq: Once | INTRAVENOUS | Status: AC
  Administered 2020-07-22: 23:00:00 1500 mg via INTRAVENOUS
  Filled 2020-07-22: qty 300

## 2020-07-22 MED ORDER — FUROSEMIDE 10 MG/ML IJ SOLN
20.0000 mg | Freq: Once | INTRAMUSCULAR | Status: AC
  Administered 2020-07-22: 20 mg via INTRAVENOUS
  Filled 2020-07-22: qty 2

## 2020-07-22 MED ORDER — INSULIN ASPART 100 UNIT/ML ~~LOC~~ SOLN
0.0000 [IU] | Freq: Three times a day (TID) | SUBCUTANEOUS | Status: DC
  Administered 2020-07-23 (×2): 3 [IU] via SUBCUTANEOUS
  Administered 2020-07-23 – 2020-07-24 (×2): 2 [IU] via SUBCUTANEOUS
  Administered 2020-07-24 (×2): 1 [IU] via SUBCUTANEOUS
  Administered 2020-07-25 – 2020-07-27 (×2): 2 [IU] via SUBCUTANEOUS
  Administered 2020-07-27: 13:00:00 1 [IU] via SUBCUTANEOUS
  Administered 2020-07-28: 3 [IU] via SUBCUTANEOUS
  Administered 2020-07-28 – 2020-07-30 (×3): 2 [IU] via SUBCUTANEOUS
  Administered 2020-07-30: 5 [IU] via SUBCUTANEOUS
  Administered 2020-07-30: 3 [IU] via SUBCUTANEOUS
  Administered 2020-07-31: 2 [IU] via SUBCUTANEOUS

## 2020-07-22 MED ORDER — IPRATROPIUM-ALBUTEROL 0.5-2.5 (3) MG/3ML IN SOLN
3.0000 mL | RESPIRATORY_TRACT | Status: DC
  Administered 2020-07-22: 23:00:00 3 mL via RESPIRATORY_TRACT
  Filled 2020-07-22: qty 3

## 2020-07-22 MED ORDER — ACETAMINOPHEN 325 MG PO TABS
650.0000 mg | ORAL_TABLET | Freq: Four times a day (QID) | ORAL | Status: DC | PRN
  Administered 2020-07-29: 650 mg via ORAL
  Filled 2020-07-22: qty 2

## 2020-07-22 NOTE — ED Provider Notes (Signed)
[name] EMERGENCY DEPARTMENT Provider Note   CSN: [id number] Arrival date & time: 07/22/20  1623     History Chief Complaint  Patient presents with  . Shortness of Breath    Jay Holloway is a 74 y.o. male.  74 yo M with a chief complaints of possible cardiac arrest.  At the patient's nursing home he was found unresponsive and they were unable to palpate a pulse.  He had approximately 5 minutes of CPR and upon arrival of EMS the patient had intact pulses.  Was unresponsive which recovered in route.  Patient denies any complaint currently.  States he has not been having chest pain shortness of breath cough fever headache neck pain.  Denies abdominal pain.  Has been eating and drinking normally per him.  The history is provided by the patient.  Shortness of Breath Severity:  Moderate Onset quality:  Gradual Duration:  5 minutes Timing:  Constant Progression:  Worsening Chronicity:  New Relieved by:  Nothing Worsened by:  Nothing Ineffective treatments:  None tried Associated symptoms: no abdominal pain, no chest pain, no fever, no headaches, no rash and no vomiting        Past Medical History:  Diagnosis Date  . Arthritis    trouble turning head side to side and up  . Diabetes mellitus without complication (HCC)   . Hemorrhoids   . High cholesterol   . Hypertension   . Prostate cancer [facility])     Patient Active Problem List   Diagnosis Date Noted  . VTE (venous thromboembolism) 07/03/2020  . At risk for obstructive sleep apnea 07/03/2020  . Former smoker 07/03/2020  . Shortness of breath 07/03/2020  . Acute respiratory failure with hypoxia and hypercapnia (HCC) 05/10/2020  . Acute respiratory failure (HCC) 05/10/2020  . Hypomagnesemia 04/28/2020  . Mixed diabetic hyperlipidemia associated with type 2 diabetes mellitus (HCC) 04/28/2020  . Pressure injury of sacral region, stage 2 (HCC) 04/19/2020  . Acute diastolic CHF (congestive heart  failure) (HCC) 04/19/2020  . Acute respiratory failure with hypoxia and hypercarbia (HCC) 04/17/2020  . Hypernatremia 04/17/2020  . Dysphagia 04/17/2020  . Acute metabolic encephalopathy 04/17/2020  . CKD (chronic kidney disease), stage II 04/17/2020  . Hyperkalemia 04/14/2020  . GERD without esophagitis 04/14/2020  . Slurred speech 04/14/2020  . Generalized weakness 04/14/2020  . AKI (acute kidney injury) (HCC) 03/21/2020  . Prostate cancer (HCC)   . Essential hypertension   . Arterial hypotension   . Renal failure   . Type 2 diabetes mellitus with hyperglycemia, with long-term current use of insulin (HCC)   . Weakness   . DKA (diabetic ketoacidoses) 11/05/2014    Past Surgical History:  Procedure Laterality Date  . CHOLECYSTECTOMY N/A 11/11/2014   Procedure: LAPAROSCOPIC CHOLECYSTECTOMY WITH INTRAOPERATIVE CHOLANGIOGRAM;  Surgeon: Harriette Bouillon, MD;  Location: [facility] OR;  Service: General;  Laterality: N/A;  . COLONOSCOPY N/A 11/09/2014   Procedure: COLONOSCOPY;  Surgeon: Charna Elizabeth, MD;  Location: [facility] ENDOSCOPY;  Service: Endoscopy;  Laterality: N/A;  . CYSTOSCOPY WITH RETROGRADE PYELOGRAM, URETEROSCOPY AND STENT PLACEMENT Left 04/18/2014   Procedure: CYSTOSCOPY WITH RETROGRADE PYELOGRAM, URETEROSCOPY AND STENT PLACEMENT;  Surgeon: Heloise Purpura, MD;  Location: [facility] ORS;  Service: Urology;  Laterality: Left;  . HOLMIUM LASER APPLICATION Left 04/18/2014   Procedure: HOLMIUM LASER APPLICATION;  Surgeon: Heloise Purpura, MD;  Location: [facility] ORS;  Service: Urology;  Laterality: Left;  . LYMPHADENECTOMY  06/04/2012   Procedure: LYMPHADENECTOMY;  Surgeon: Crecencio Mc, MD;  Location:  [facility] ORS;  Service: Urology;  Laterality: Bilateral;  . right  achilles tendon repair  yrs ago  . ROBOT ASSISTED LAPAROSCOPIC RADICAL PROSTATECTOMY  06/04/2012   Procedure: ROBOTIC ASSISTED LAPAROSCOPIC RADICAL PROSTATECTOMY LEVEL 3;  Surgeon: Dutch Gray, MD;  Location: [facility] ORS;  Service: Urology;  Laterality: N/A;        Family History  Family history unknown: Yes    Social History   Tobacco Use  . Smoking status: Former Smoker    Packs/day: 0.25    Years: 4.00    Pack years: 1.00    Types: Cigarettes    Quit date: 12/16/1978    Years since quitting: 41.6  . Smokeless tobacco: Never Used  Substance Use Topics  . Alcohol use: Not Currently    Comment: remote h/o heavy use  . Drug use: Not Currently    Types: Marijuana    Comment: 1970s    Home Medications Prior to Admission medications   Medication Sig Start Date End Date Taking? Authorizing Provider  acetaminophen (TYLENOL) 325 MG tablet Take 2 tablets (650 mg total) by mouth every 6 (six) hours as needed for mild pain, moderate pain or headache. 05/18/20  Yes Hongalgi, Lenis Dickinson, MD  apixaban (ELIQUIS) 5 MG TABS tablet Take 5 mg by mouth 2 (two) times daily.   Yes [provider]  atorvastatin (LIPITOR) 10 MG tablet Take 10 mg by mouth daily.  04/21/18  Yes [provider]  Cholecalciferol (VITAMIN D3) 25 MCG (1000 UT) CAPS Take 1,000 Units by mouth daily.  10/15/18  Yes [provider]  diltiazem (CARDIZEM) 60 MG tablet Take 1 tablet (60 mg total) by mouth 3 (three) times daily. 05/18/20  Yes Hongalgi, Lenis Dickinson, MD  furosemide (LASIX) 40 MG tablet Take 0.5 tablets (20 mg total) by mouth daily. fluid Patient taking differently: Take 20 mg by mouth daily. 03/24/20  Yes Swayze, Ava, DO  gabapentin (NEURONTIN) 100 MG capsule Take 100 mg by mouth 3 (three) times daily. 07/13/20  Yes [provider]  HYDROcodone-acetaminophen (NORCO/VICODIN) 5-325 MG tablet Take 1 tablet by mouth in the morning, at noon, and at bedtime. 07/17/20  Yes [provider]  insulin glargine (LANTUS) 100 UNIT/ML injection Inject 0.1 mLs (10 Units total) into the skin daily. 05/19/20  Yes Hongalgi, Lenis Dickinson, MD  levalbuterol (XOPENEX) 1.25 MG/0.5ML nebulizer solution Take 0.63 mg by nebulization every 6 (six) hours as needed for  wheezing or shortness of breath. 05/18/20  Yes Hongalgi, Lenis Dickinson, MD  magnesium oxide (MAG-OX) 400 MG tablet Take 400 mg by mouth daily.   Yes [provider]  metFORMIN (GLUCOPHAGE-XR) 500 MG 24 hr tablet Take 500 mg by mouth at bedtime.  10/15/18  Yes [provider]  Multiple Vitamins-Minerals (MULTIVITAMIN ADULTS) TABS Take 1 tablet by mouth daily.   Yes [provider]  ondansetron (ZOFRAN) 4 MG tablet Take 4 mg by mouth every 8 (eight) hours as needed for nausea or vomiting.   Yes [provider]  pantoprazole (PROTONIX) 40 MG tablet Take 1 tablet (40 mg total) by mouth 2 (two) times daily. 03/24/20  Yes Swayze, Ava, DO  Pollen Extracts (PROSTAT PO) Take 30 mLs by mouth 2 (two) times daily.   Yes [provider]  Semaglutide (OZEMPIC, 1 MG/DOSE, [place]) Inject 1 mg into the skin every Wednesday.   Yes [provider]  apixaban (ELIQUIS) 5 MG TABS tablet Take 1 tablet (5 mg total) by mouth 2 (two) times daily. Patient  not taking: Reported on 07/22/2020 05/10/20 06/09/20  Jerald Kief, MD  feeding supplement (ENSURE ENLIVE / ENSURE PLUS) LIQD Take 237 mLs by mouth 2 (two) times daily between meals. 05/18/20   Hongalgi, Maximino Greenland, MD  potassium chloride 20 MEQ TBCR Take 20 mEq by mouth daily. Patient not taking: Reported on 07/22/2020 03/24/20 05/10/20  Swayze, Ava, DO    Allergies    Patient has no known allergies.  Review of Systems   Review of Systems  Constitutional: Negative for chills and fever.  HENT: Negative for congestion and facial swelling.   Eyes: Negative for discharge and visual disturbance.  Respiratory: Positive for shortness of breath.   Cardiovascular: Negative for chest pain and palpitations.  Gastrointestinal: Negative for abdominal pain, diarrhea and vomiting.  Musculoskeletal: Negative for arthralgias and myalgias.  Skin: Negative for color change and rash.  Neurological: Negative for tremors, syncope and headaches.   Psychiatric/Behavioral: Negative for confusion and dysphoric mood.    Physical Exam Updated Vital Signs BP 105/86   Pulse 99   Temp 97.8 F (36.6 C)   Resp 19   SpO2 94%   Physical Exam Vitals and nursing note reviewed.  Constitutional:      Appearance: He is well-developed and well-nourished.  HENT:     Head: Normocephalic and atraumatic.  Eyes:     Extraocular Movements: EOM normal.     Pupils: Pupils are equal, round, and reactive to light.  Neck:     Vascular: No JVD.  Cardiovascular:     Rate and Rhythm: Normal rate and regular rhythm.     Heart sounds: No murmur heard. No friction rub. No gallop.   Pulmonary:     Effort: No respiratory distress.     Breath sounds: Examination of the right-upper field reveals decreased breath sounds. Examination of the right-middle field reveals decreased breath sounds. Examination of the right-lower field reveals decreased breath sounds. Decreased breath sounds present. No wheezing.  Chest:     Chest wall: Tenderness (mild right sided) present.  Abdominal:     General: There is no distension.     Tenderness: There is no guarding or rebound.  Musculoskeletal:        General: Normal range of motion.     Cervical back: Normal range of motion and neck supple.  Skin:    Coloration: Skin is not pale.     Findings: No rash.  Neurological:     Mental Status: He is alert and oriented to person, place, and time.  Psychiatric:        Mood and Affect: Mood and affect normal.        Behavior: Behavior normal.     ED Results / Procedures / Treatments   Labs (all labs ordered are listed, but only abnormal results are displayed) Labs Reviewed  CBC WITH DIFFERENTIAL/PLATELET - Abnormal; Notable for the following components:      Result Value   WBC 11.1 (*)    RBC 4.03 (*)    Hemoglobin 10.9 (*)    HCT 38.1 (*)    MCHC 28.6 (*)    RDW 16.4 (*)    Platelets 401 (*)    All other components within normal limits  COMPREHENSIVE  METABOLIC PANEL - Abnormal; Notable for the following components:   Chloride 97 (*)    Glucose, Bld 200 (*)    BUN 30 (*)    Creatinine, Ser 1.41 (*)    Calcium 8.5 (*)    Total  Protein 5.7 (*)    Albumin 2.4 (*)    ALT 45 (*)    GFR, Estimated 53 (*)    All other components within normal limits  BRAIN NATRIURETIC PEPTIDE - Abnormal; Notable for the following components:   B Natriuretic Peptide 222.2 (*)    All other components within normal limits  CBG MONITORING, ED - Abnormal; Notable for the following components:   Glucose-Capillary 188 (*)    All other components within normal limits  I-STAT CREATININE, ED - Abnormal; Notable for the following components:   Creatinine, Ser 1.40 (*)    All other components within normal limits  I-STAT VENOUS BLOOD GAS, ED - Abnormal; Notable for the following components:   pCO2, Ven 75.5 (*)    pO2, Ven 79.0 (*)    Bicarbonate 37.9 (*)    TCO2 40 (*)    Acid-Base Excess 9.0 (*)    Calcium, Ion 1.10 (*)    HCT 37.0 (*)    Hemoglobin 12.6 (*)    All other components within normal limits  RESP PANEL BY RT-PCR (FLU A&B, COVID) ARPGX2  TROPONIN I (HIGH SENSITIVITY)  TROPONIN I (HIGH SENSITIVITY)    EKG EKG Interpretation  Date/Time:  Saturday July 22 2020 16:24:32 EST Ventricular Rate:  95 PR Interval:    QRS Duration: 130 QT Interval:  360 QTC Calculation: 453 R Axis:   -39 Text Interpretation: Sinus rhythm Prolonged PR interval Right bundle branch block No significant change since last tracing Confirmed by Deno Etienne [phone]) on 07/22/2020 4:40:05 PM   Radiology DG Chest 1 View  Result Date: 07/22/2020 CLINICAL DATA:  Absent RIGHT-side breath sounds EXAM: CHEST  1 VIEW COMPARISON:  Portable exam 1631 hours compared to 07/03/2020 FINDINGS: Normal heart size, mediastinal contours, and pulmonary vascularity. Marked elevation of RIGHT diaphragm with bowel interposition. Atelectasis and/or infiltrate at RIGHT base. Remaining lungs  clear. No pleural effusion or pneumothorax. Gaseous distention of stomach. IMPRESSION: Elevation of RIGHT diaphragm with atelectasis and/or infiltrate at RIGHT lung base. Gaseous distention of stomach. Electronically Signed   By: Lavonia Dana M.D.   On: 07/22/2020 16:37   CT Head Wo Contrast  Result Date: 07/22/2020 CLINICAL DATA:  Delirium; history diabetes mellitus, hypertension, prostate cancer EXAM: CT HEAD WITHOUT CONTRAST TECHNIQUE: Contiguous axial images were obtained from the base of the skull through the vertex without intravenous contrast. Sagittal and coronal MPR images reconstructed from axial data set. COMPARISON:  04/28/2020 FINDINGS: Brain: Generalized atrophy. Diffuse prominence of the ventricular system consistent with atrophy, unchanged. No midline shift or mass effect. Mild small vessel chronic ischemic changes of deep cerebral white matter. No intracranial hemorrhage, mass lesion, or evidence of acute infarction. Vascular: No hyperdense vessels Skull: Intact Sinuses/Orbits: Clear Other: N/A IMPRESSION: Atrophy with small vessel chronic ischemic changes of deep cerebral white matter. No acute intracranial abnormalities. Electronically Signed   By: Lavonia Dana M.D.   On: 07/22/2020 19:44   CT Angio Chest PE W and/or Wo Contrast  Result Date: 07/22/2020 CLINICAL DATA:  Shortness of breath, delirium, question pulmonary embolism EXAM: CT ANGIOGRAPHY CHEST WITH CONTRAST TECHNIQUE: Multidetector CT imaging of the chest was performed using the standard protocol during bolus administration of intravenous contrast. Multiplanar CT image reconstructions and MIPs were obtained to evaluate the vascular anatomy. CONTRAST:  11mL OMNIPAQUE IOHEXOL 350 MG/ML SOLN IV COMPARISON:  04/30/2020 FINDINGS: Cardiovascular: Atherosclerotic calcifications aorta and coronary arteries. Aorta normal caliber without aneurysm or dissection. Borderline for LEFT ventricular hypertrophy. No pericardial effusion.  Pulmonary  arteries adequately opacified. Lower lobe pulmonary artery branches are suboptimally assessed due to consolidation and beam hardening. No definite pulmonary emboli identified. Mediastinum/Nodes: Esophagus unremarkable. Base of cervical region normal appearance. No thoracic adenopathy. Lungs/Pleura: Significant elevation of RIGHT diaphragm with bowel interposition. Consolidation involving RIGHT lower lobe as well as posterior segment of RIGHT upper lobe consistent with pneumonia. Consolidation in medial LEFT lower lobe as well. Minimal lingular infiltrate. Small RIGHT pleural effusion. No pneumothorax. Upper Abdomen: Bowel interposition. Scattered foci of gas are seen under the RIGHT diaphragm which appear to be related to bowel loops. No definite free air. Visualized portion of liver unremarkable. Musculoskeletal: No acute osseous findings. Diffuse ossification of the interspinous ligament. Review of the MIP images confirms the above findings. IMPRESSION: No definite evidence of pulmonary embolism, with assessment of lower lobe pulmonary artery branches limited due to beam hardening. Significant elevation of RIGHT diaphragm with bowel interposition. BILATERAL lower lobe consolidation consistent with pneumonia, greater on RIGHT. Small RIGHT pleural effusion. Aortic Atherosclerosis (ICD10-I70.0). Electronically Signed   By: Ulyses SouthwardMark  Boles M.D.   On: 07/22/2020 19:58    Procedures Procedures (including critical care time)  Medications Ordered in ED Medications  piperacillin-tazobactam (ZOSYN) IVPB 3.375 g (has no administration in time range)  iohexol (OMNIPAQUE) 350 MG/ML injection 75 mL (75 mLs Intravenous Contrast Given 07/22/20 1933)    ED Course  I have reviewed the triage vital signs and the nursing notes.  Pertinent labs & imaging results that were available during my care of the patient were reviewed by me and considered in my medical decision making (see chart for details).    MDM  Rules/Calculators/A&P                          74 yo M with a chief complaints of a possible cardiac arrest.  Arrived after 5 minutes of CPR.  Pulses intact the entire time of EMS.  Upon arrival here the patient is awake and alert and able to have a conversation with me.  He denies any symptoms though is requiring nonrebreather to oxygenate.  He has significantly diminished breath sounds on the right.  Stat chest x-ray without obvious pneumothorax.  He does have a possible right-sided infiltrate which could be due to aspiration during his CPR.  Will obtain a laboratory evaluation.  Likely CT scan of the chest.  Of note the patient was listed as a DNR the last for 5 visits to the hospital.  When asked the patient if he would like CPR performed where he to arrest again he did endorse that he would like to have CPR performed and would like to be intubated if need be.  Patient is hypercarbic on his VBG but he seems more alert now than he was when he arrived.  With the possibility of occult pneumothorax we will hold off on starting on BiPAP.  Awaiting a CT scan of the chest.  Troponin negative no significant anemia.  Mental status continues to improve.  CT scan of the chest with likely pneumonia as read by me.  Read without pulmonary embolism.  No pneumothorax.  Started on antibiotics.  Continues to require oxygen titrated down to 2 L.  Will discuss with medicine for admission.  CRITICAL CARE Performed by: Rae Roamaniel Patrick Slate Debroux   Total critical care time: 35 minutes  Critical care time was exclusive of separately billable procedures and treating other patients.  Critical care was necessary to treat or  prevent imminent or life-threatening deterioration.  Critical care was time spent personally by me on the following activities: development of treatment plan with patient and/or surrogate as well as nursing, discussions with consultants, evaluation of patient's response to treatment, examination of  patient, obtaining history from patient or surrogate, ordering and performing treatments and interventions, ordering and review of laboratory studies, ordering and review of radiographic studies, pulse oximetry and re-evaluation of patient's condition.  The patients results and plan were reviewed and discussed.   Any x-rays performed were independently reviewed by myself.   Differential diagnosis were considered with the presenting HPI.  Medications  piperacillin-tazobactam (ZOSYN) IVPB 3.375 g (has no administration in time range)  iohexol (OMNIPAQUE) 350 MG/ML injection 75 mL (75 mLs Intravenous Contrast Given 07/22/20 1933)    Vitals:   07/22/20 1930 07/22/20 1945 07/22/20 2000 07/22/20 2015  BP: 114/67 112/65 111/67 105/86  Pulse: 93 90 88 99  Resp: (!) 23 (!) 21 (!) 23 19  Temp:      SpO2: 97% 97% 98% 94%    Final diagnoses:  Aspiration pneumonia of right lung due to gastric secretions, unspecified part of lung ([place])    Admission/ observation were discussed with the admitting physician, patient and/or family and they are comfortable with the plan.   Final Clinical Impression(s) / ED Diagnoses Final diagnoses:  Aspiration pneumonia of right lung due to gastric secretions, unspecified part of lung [facility])    Rx / DC Orders ED Discharge Orders    None       Deno Etienne, DO 07/22/20 2024

## 2020-07-22 NOTE — ED Notes (Signed)
Pt observed on arrival to have an inch in diameter (half dollar sized) s3 decubitus ulcer on centerline of coccyx. MD Lucious Nine notified. Pt repositioned to relieve pressure.

## 2020-07-22 NOTE — H&P (Signed)
History and Physical    Amal Palmatier [id number] DOB: 1946-06-17 DOA: 07/22/2020  Referring MD/NP/PA:   PCP: [name], MD   Patient coming from:  The patient is coming from SNF.  At baseline, pt is dependent for most of ADL.        Chief Complaint: unresponsiveness  HPI: Dorsel Monteiro is a 74 y.o. 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  Per report, pt is from SNF. Earlier today pt had brief period of apnea and became unresponsive with possible cardiac arrest. They were unable to palpate a pulse.  He had approximately 5 minutes of CPR. Upon arrival of EMS the patient had intact pulses. Pt is still confused when I saw pt in ED. He knows his own name, but is not orientated to the place and time.  Patient moves all extremities.  No facial droop or slurred speech.  Per his nephew, patient has been having shortness of breath and some cough in the past several days.  Patient does not have fever.  Patient had several episodes of diarrhea in the past week per his nephew, which has resolved.  Currently patient does not have nausea, vomiting, diarrhea or abdominal pain. Pt was found to have oxygen desaturation to 83% on room air. Pt was put on nonrebreather.    ED Course: pt was found to have WBC 11.1, troponin level 13, 12, BNP 222, negative Covid PCR, renal function close to baseline, temperature normal, blood pressure 105/83, heart rate 103, 99, RR 24, CT head is negative for acute intracranial abnormalities.  CT angiogram is negative for PE, but showed bilateral lower lobe infiltration with small right pleural effusion, right diaphragmatic elevation with bowel interposition.  Patient is admitted to progressive bed as inpatient.  Review of Systems: Could not be reviewed accurately due to confusion  Allergy: No Known  Allergies  Past Medical History:  Diagnosis Date  . Arthritis    trouble turning head side to side and up  . Diabetes mellitus without complication ([place])   . Hemorrhoids   . High cholesterol   . Hypertension   . Prostate cancer [facility])     Past Surgical History:  Procedure Laterality Date  . CHOLECYSTECTOMY N/A 11/11/2014   Procedure: LAPAROSCOPIC CHOLECYSTECTOMY WITH INTRAOPERATIVE CHOLANGIOGRAM;  Surgeon: Erroll Luna, MD;  Location: [place];  Service: General;  Laterality: N/A;  . COLONOSCOPY N/A 11/09/2014   Procedure: COLONOSCOPY;  Surgeon: Juanita Craver, MD;  Location[facility] ENDOSCOPY;  Service: Endoscopy;  Laterality: N/A;  . CYSTOSCOPY WITH RETROGRADE PYELOGRAM, URETEROSCOPY AND STENT PLACEMENT Left 04/18/2014   Procedure: CYSTOSCOPY WITH RETROGRADE PYELOGRAM, URETEROSCOPY AND STENT PLACEMENT;  Surgeon: Raynelle Bring, MD;  Location: [facility] ORS;  Service: Urology;  Laterality: Left;  . HOLMIUM LASER APPLICATION Left [id number]   Procedure: HOLMIUM LASER APPLICATION;  Surgeon: Raynelle Bring, MD;  Location: [facility];  Service: Urology;  Laterality: Left;  . LYMPHADENECTOMY  06/04/2012   Procedure: LYMPHADENECTOMY;  Surgeon: Dutch Gray, MD;  Location: [facility];  Service: Urology;  Laterality: Bilateral;  . right  achilles tendon repair  yrs ago  . ROBOT ASSISTED LAPAROSCOPIC RADICAL PROSTATECTOMY  06/04/2012   Procedure: ROBOTIC ASSISTED LAPAROSCOPIC RADICAL PROSTATECTOMY LEVEL 3;  Surgeon: Dutch Gray, MD;  Location: [facility];  Service: Urology;  Laterality: N/A;    Social History:  reports that he quit smoking about 41 years ago. His smoking use included  cigarettes. He has a 1.00 pack-year smoking history. He has never used smokeless tobacco. He reports previous alcohol use. He reports previous drug use. Drug: Marijuana.  Family History: Could not be reviewed due to altered mental status. Family History  Family history unknown: Yes     Prior to Admission medications   Medication Sig Start Date  End Date Taking? Authorizing Provider  acetaminophen (TYLENOL) 325 MG tablet Take 2 tablets (650 mg total) by mouth every 6 (six) hours as needed for mild pain, moderate pain or headache. 05/18/20  Yes Hongalgi, Lenis Dickinson, MD  apixaban (ELIQUIS) 5 MG TABS tablet Take 5 mg by mouth 2 (two) times daily.   Yes [provider]  atorvastatin (LIPITOR) 10 MG tablet Take 10 mg by mouth daily.  04/21/18  Yes [provider]  Cholecalciferol (VITAMIN D3) 25 MCG (1000 UT) CAPS Take 1,000 Units by mouth daily.  10/15/18  Yes [provider]  diltiazem (CARDIZEM) 60 MG tablet Take 1 tablet (60 mg total) by mouth 3 (three) times daily. 05/18/20  Yes Hongalgi, Lenis Dickinson, MD  furosemide (LASIX) 40 MG tablet Take 0.5 tablets (20 mg total) by mouth daily. fluid Patient taking differently: Take 20 mg by mouth daily. 03/24/20  Yes Swayze, Ava, DO  gabapentin (NEURONTIN) 100 MG capsule Take 100 mg by mouth 3 (three) times daily. 07/13/20  Yes [provider]  HYDROcodone-acetaminophen (NORCO/VICODIN) 5-325 MG tablet Take 1 tablet by mouth in the morning, at noon, and at bedtime. 07/17/20  Yes [provider]  insulin glargine (LANTUS) 100 UNIT/ML injection Inject 0.1 mLs (10 Units total) into the skin daily. 05/19/20  Yes Hongalgi, Lenis Dickinson, MD  levalbuterol (XOPENEX) 1.25 MG/0.5ML nebulizer solution Take 0.63 mg by nebulization every 6 (six) hours as needed for wheezing or shortness of breath. 05/18/20  Yes Hongalgi, Lenis Dickinson, MD  magnesium oxide (MAG-OX) 400 MG tablet Take 400 mg by mouth daily.   Yes [provider]  metFORMIN (GLUCOPHAGE-XR) 500 MG 24 hr tablet Take 500 mg by mouth at bedtime.  10/15/18  Yes [provider]  Multiple Vitamins-Minerals (MULTIVITAMIN ADULTS) TABS Take 1 tablet by mouth daily.   Yes [provider]  ondansetron (ZOFRAN) 4 MG tablet Take 4 mg by mouth every 8 (eight) hours as needed for nausea or vomiting.   Yes [provider]  pantoprazole (PROTONIX) 40 MG tablet Take 1 tablet (40 mg total) by mouth 2 (two) times daily. 03/24/20  Yes Swayze, Ava, DO  Pollen Extracts (PROSTAT PO) Take 30 mLs by mouth 2 (two) times daily.   Yes [provider]  Semaglutide (OZEMPIC, 1 MG/DOSE, [place]) Inject 1 mg into the skin every Wednesday.   Yes [provider]  apixaban (ELIQUIS) 5 MG TABS tablet Take 1 tablet (5 mg total) by mouth 2 (two) times daily. Patient not taking: Reported on 07/22/2020 05/10/20 06/09/20  Donne Hazel, MD  feeding supplement (ENSURE ENLIVE / ENSURE PLUS) LIQD Take 237 mLs by mouth 2 (two) times daily between meals. 05/18/20   Hongalgi, Lenis Dickinson, MD  potassium chloride 20 MEQ TBCR Take 20 mEq by mouth daily. Patient not taking: Reported on 07/22/2020 03/24/20 05/10/20  Karie Kirks, DO    Physical Exam: Vitals:   07/22/20 2115 07/22/20 2130 07/22/20 2145 07/22/20 2215  BP: 115/77 118/69 114/68 110/73  Pulse: 94 89 91 96  Resp: 15 17 (!) 22 18  Temp:      SpO2: 94% 96% 95% 97%  General: Not in acute distress HEENT:       Eyes: PERRL, EOMI, no scleral icterus.       ENT: No discharge from the ears and nose.       Neck: No JVD, no bruit, no mass felt. Heme: No neck lymph node enlargement. Cardiac: S1/S2, RRR, No murmurs, No gallops or rubs. Respiratory: Has fine crackles bilaterally at the base GI: Soft, nondistended, nontender, no rebound pain, no organomegaly, BS present. GU: No hematuria Ext: 2+ pitting leg edema bilaterally.1+DP/PT pulse bilaterally. Musculoskeletal: No joint deformities, No joint redness or warmth, no limitation of ROM in spin. Skin: No rashes.  Has sacral ulcer Neuro: confused, knows his own name, not orientated to place and time. Moves all extremities. Psych: Patient is not psychotic, no suicidal or hemocidal ideation.  Labs on Admission: I have personally reviewed following labs and imaging studies  CBC: Recent Labs  Lab 07/22/20 1628  07/22/20 1707  WBC 11.1*  --   NEUTROABS 6.5  --   HGB 10.9* 12.6*  HCT 38.1* 37.0*  MCV 94.5  --   PLT 401*  --    Basic Metabolic Panel: Recent Labs  Lab 07/22/20 1628 07/22/20 1707 07/22/20 1710  NA 138 136  --   K 4.8 4.6  --   CL 97*  --   --   CO2 32  --   --   GLUCOSE 200*  --   --   BUN 30*  --   --   CREATININE 1.41*  --  1.40*  CALCIUM 8.5*  --   --    GFR: CrCl cannot be calculated (Unknown ideal weight.). Liver Function Tests: Recent Labs  Lab 07/22/20 1628  AST 36  ALT 45*  ALKPHOS 116  BILITOT 0.5  PROT 5.7*  ALBUMIN 2.4*   No results for input(s): LIPASE, AMYLASE in the last 168 hours. No results for input(s): AMMONIA in the last 168 hours. Coagulation Profile: No results for input(s): INR, PROTIME in the last 168 hours. Cardiac Enzymes: No results for input(s): CKTOTAL, CKMB, CKMBINDEX, TROPONINI in the last 168 hours. BNP (last 3 results) No results for input(s): PROBNP in the last 8760 hours. HbA1C: No results for input(s): HGBA1C in the last 72 hours. CBG: Recent Labs  Lab 07/22/20 1640 07/22/20 2218  GLUCAP 188* 169*   Lipid Profile: No results for input(s): CHOL, HDL, LDLCALC, TRIG, CHOLHDL, LDLDIRECT in the last 72 hours. Thyroid Function Tests: No results for input(s): TSH, T4TOTAL, FREET4, T3FREE, THYROIDAB in the last 72 hours. Anemia Panel: No results for input(s): VITAMINB12, FOLATE, FERRITIN, TIBC, IRON, RETICCTPCT in the last 72 hours. Urine analysis:    Component Value Date/Time   COLORURINE AMBER (A) 04/28/2020 2016   APPEARANCEUR CLOUDY (A) 04/28/2020 2016   LABSPEC 1.020 04/28/2020 2016   PHURINE 5.0 04/28/2020 2016   GLUCOSEU NEGATIVE 04/28/2020 2016   HGBUR NEGATIVE 04/28/2020 2016   [place] NEGATIVE 04/28/2020 2016   [place] NEGATIVE 04/28/2020 2016   PROTEINUR 100 (A) 04/28/2020 2016   UROBILINOGEN 0.2 11/05/2014 1712   NITRITE NEGATIVE 04/28/2020 2016   LEUKOCYTESUR LARGE (A) 04/28/2020 2016    Sepsis Labs: @LABRCNTIP (procalcitonin:4,lacticidven:4) ) Recent Results (from the past 240 hour(s))  Resp Panel by RT-PCR (Flu A&B, Covid) Nasopharyngeal Swab     Status: None   Collection Time: 07/22/20  4:37 PM   Specimen: Nasopharyngeal Swab; Nasopharyngeal(NP) swabs in vial transport medium  Result Value Ref Range Status   SARS Coronavirus 2 by RT  PCR NEGATIVE NEGATIVE Final    Comment: (NOTE) SARS-CoV-2 target nucleic acids are NOT DETECTED.  The SARS-CoV-2 RNA is generally detectable in upper respiratory specimens during the acute phase of infection. The lowest concentration of SARS-CoV-2 viral copies this assay can detect is 138 copies/mL. A negative result does not preclude SARS-Cov-2 infection and should not be used as the sole basis for treatment or other patient management decisions. A negative result may occur with  improper specimen collection/handling, submission of specimen other than nasopharyngeal swab, presence of viral mutation(s) within the areas targeted by this assay, and inadequate number of viral copies(<138 copies/mL). A negative result must be combined with clinical observations, patient history, and epidemiological information. The expected result is Negative.  Fact Sheet for Patients:  EntrepreneurPulse.com.au  Fact Sheet for Healthcare Providers:  IncredibleEmployment.be  This test is no t yet approved or cleared by the [place] FDA and  has been authorized for detection and/or diagnosis of SARS-CoV-2 by FDA under an Emergency Use Authorization (EUA). This EUA will remain  in effect (meaning this test can be used) for the duration of the COVID-19 declaration under Section 564(b)(1) of the Act, 21 U.S.C.section 360bbb-3(b)(1), unless the authorization is terminated  or revoked sooner.       Influenza A by PCR NEGATIVE NEGATIVE Final   Influenza B by PCR NEGATIVE NEGATIVE Final    Comment: (NOTE) The  Xpert Xpress SARS-CoV-2/FLU/RSV plus assay is intended as an aid in the diagnosis of influenza from Nasopharyngeal swab specimens and should not be used as a sole basis for treatment. Nasal washings and aspirates are unacceptable for Xpert Xpress SARS-CoV-2/FLU/RSV testing.  Fact Sheet for Patients: EntrepreneurPulse.com.au  Fact Sheet for Healthcare Providers: IncredibleEmployment.be  This test is not yet approved or cleared by the [place] FDA and has been authorized for detection and/or diagnosis of SARS-CoV-2 by FDA under an Emergency Use Authorization (EUA). This EUA will remain in effect (meaning this test can be used) for the duration of the COVID-19 declaration under Section 564(b)(1) of the Act, 21 U.S.C. section 360bbb-3(b)(1), unless the authorization is terminated or revoked.  Performed at [facility], [place] [address], [place], [place] [address]      Radiological Exams on Admission: DG Chest 1 View  Result Date: 07/22/2020 CLINICAL DATA:  Absent RIGHT-side breath sounds EXAM: CHEST  1 VIEW COMPARISON:  Portable exam 1631 hours compared to 07/03/2020 FINDINGS: Normal heart size, mediastinal contours, and pulmonary vascularity. Marked elevation of RIGHT diaphragm with bowel interposition. Atelectasis and/or infiltrate at RIGHT base. Remaining lungs clear. No pleural effusion or pneumothorax. Gaseous distention of stomach. IMPRESSION: Elevation of RIGHT diaphragm with atelectasis and/or infiltrate at RIGHT lung base. Gaseous distention of stomach. Electronically Signed   By: Lavonia Dana M.D.   On: 07/22/2020 16:37   CT Head Wo Contrast  Result Date: 07/22/2020 CLINICAL DATA:  Delirium; history diabetes mellitus, hypertension, prostate cancer EXAM: CT HEAD WITHOUT CONTRAST TECHNIQUE: Contiguous axial images were obtained from the base of the skull through the vertex without intravenous contrast. Sagittal and coronal MPR images  reconstructed from axial data set. COMPARISON:  04/28/2020 FINDINGS: Brain: Generalized atrophy. Diffuse prominence of the ventricular system consistent with atrophy, unchanged. No midline shift or mass effect. Mild small vessel chronic ischemic changes of deep cerebral white matter. No intracranial hemorrhage, mass lesion, or evidence of acute infarction. Vascular: No hyperdense vessels Skull: Intact Sinuses/Orbits: Clear Other: N/A IMPRESSION: Atrophy with small vessel chronic ischemic changes of deep cerebral  white matter. No acute intracranial abnormalities. Electronically Signed   By: Lavonia Dana M.D.   On: 07/22/2020 19:44   CT Angio Chest PE W and/or Wo Contrast  Result Date: 07/22/2020 CLINICAL DATA:  Shortness of breath, delirium, question pulmonary embolism EXAM: CT ANGIOGRAPHY CHEST WITH CONTRAST TECHNIQUE: Multidetector CT imaging of the chest was performed using the standard protocol during bolus administration of intravenous contrast. Multiplanar CT image reconstructions and MIPs were obtained to evaluate the vascular anatomy. CONTRAST:  52mL OMNIPAQUE IOHEXOL 350 MG/ML SOLN IV COMPARISON:  04/30/2020 FINDINGS: Cardiovascular: Atherosclerotic calcifications aorta and coronary arteries. Aorta normal caliber without aneurysm or dissection. Borderline for LEFT ventricular hypertrophy. No pericardial effusion. Pulmonary arteries adequately opacified. Lower lobe pulmonary artery branches are suboptimally assessed due to consolidation and beam hardening. No definite pulmonary emboli identified. Mediastinum/Nodes: Esophagus unremarkable. Base of cervical region normal appearance. No thoracic adenopathy. Lungs/Pleura: Significant elevation of RIGHT diaphragm with bowel interposition. Consolidation involving RIGHT lower lobe as well as posterior segment of RIGHT upper lobe consistent with pneumonia. Consolidation in medial LEFT lower lobe as well. Minimal lingular infiltrate. Small RIGHT pleural effusion.  No pneumothorax. Upper Abdomen: Bowel interposition. Scattered foci of gas are seen under the RIGHT diaphragm which appear to be related to bowel loops. No definite free air. Visualized portion of liver unremarkable. Musculoskeletal: No acute osseous findings. Diffuse ossification of the interspinous ligament. Review of the MIP images confirms the above findings. IMPRESSION: No definite evidence of pulmonary embolism, with assessment of lower lobe pulmonary artery branches limited due to beam hardening. Significant elevation of RIGHT diaphragm with bowel interposition. BILATERAL lower lobe consolidation consistent with pneumonia, greater on RIGHT. Small RIGHT pleural effusion. Aortic Atherosclerosis (ICD10-I70.0). Electronically Signed   By: Lavonia Dana M.D.   On: 07/22/2020 19:58     EKG: I have personally reviewed.  Sinus rhythm, QTC 453, bifascicular block, low voltage, early R wave progression  Assessment/Plan Principal Problem:   Aspiration pneumonia (HCC) Active Problems:   Essential hypertension   GERD without esophagitis   Acute respiratory failure with hypoxia and hypercapnia (HCC)   VTE (venous thromboembolism)   HCAP (healthcare-associated pneumonia)   Chronic diastolic CHF (congestive heart failure) (HCC)   Type II diabetes mellitus with renal manifestations (HCC)   Sepsis (HCC)   Decubital ulcer   Unresponsiveness   HLD (hyperlipidemia)   CKD (chronic kidney disease), stage IIIa   PE (pulmonary thromboembolism) (HCC)   Acute respiratory failure with hypoxia and hypercapnia and sepsis due to aspiration pneumonia vs. HCAP: CT angiogram is negative for PE, but it showed bilateral lower lobe infiltration.  Patient has mild leukocytosis.  Patient meets criteria for sepsis with tachycardia with heart rate of 103, tachypnea with RR 24.  Pending lactic acid level.  VBG with pH 7.309, CO2 75, O2 40.  -Admitted to progressive unit as inpatient - IV Vancomycin and cefepime, Zosyn  -  Mucinex for cough  - Bronchodilators - Urine legionella and S. pneumococcal antigen - Follow up blood culture x2, sputum culture - will get Procalcitonin and trend lactic acid level per sepsis protocol - IVF: will not give IVF since pt has bilateral 2+ leg edema and elevated BNP with possible fluid overload. -Palliative consult  Chronic diastolic CHF (congestive heart failure): 2D echo on 05/16/2020 showed EF of 60 to 65%.  Patient has 2+ leg edema and elevated BNP 222, but CT angiogram did not show pulmonary edema.  Patient may have mild fluid overload. -Give 1 dose of Lasix  20 mg x 1 tonight -Continue home oral Lasix tomorrow, 20 mg daily  Essential hypertension -IV hydralazine as needed -Hold Cardizem due to softer blood pressure -Patient is on Lasix  GERD without esophagitis -Protonix  VTE (venous thromboembolism) and PE -Switch Eliquis to IV heparin due to altered mental status  Type II diabetes mellitus with renal manifestations [facility]): Recent A1c 7.8, poorly controlled.  Patient is taking Ozempic, Metformin and Lantus -Decrease Lantus dose from 10 to 7 unit daily -Sliding scale insulin  Decubital ulcer -Wound care consult  HLD: -Lipitor  CKD (chronic kidney disease), stage IIIa: Close to baseline.  Recent baseline creatinine 1.2-1.3.  His creatinine is 1.40, BUN 30. -Follow-up renal function with BMP  Unresponsiveness: Patient had one episode of unresponsiveness in facility with questionable cardiac arrest. Patient had CPR in facility.  Currently patient does not seem to have chest. EKG showed bifascicular block.  Troponin negative x2.  CT head is negative for acute intracranial abnormalities. -Frequent neuro check -tele monitoring     DVT ppx:on Eliquis -->changed to IV heparin Code Status: Partial code (I discussed with the patient's nephew, and explained the meaning of CODE STATUS. Per his nephew, patient will not want to be intubated. He is not very sure about  CPR, but finally he nephew agreed partial code, OK for CPR and no intubation).  Family Communication: Yes, patient's  Nephew by phone Disposition Plan:  Anticipate discharge back to previous environment Consults called:  none Admission status:   progressive unit as inpt     Status is: Inpatient  Remains inpatient appropriate because:Inpatient level of care appropriate due to severity of illness.  Patient has multiple comorbidities, now presents with acute on chronic respiratory failure with hypoxia and hypercapnia due to possible HCAP versus aspiration pneumonia.  Patient meets criteria for sepsis.  Patient also has unresponsiveness versus possible cardiac arrest.  Her presentation is highly complicated.  Patient is at high risk of deteriorating.  Need to be treated in hospital for at least 2 days.   Dispo: The patient is from: SNF              Anticipated d/c is to: SNF              Anticipated d/c date is: 2 days              Patient currently is not medically stable to d/c.          Date of Service 07/22/2020    Ivor Costa [facility]   If 7PM-7AM, please contact night-coverage [email or web address] 07/22/2020, 10:51 PM

## 2020-07-22 NOTE — ED Notes (Signed)
Pt to CT

## 2020-07-22 NOTE — Progress Notes (Signed)
Pharmacy Antibiotic Note  Jay Holloway is a 74 y.o. male admitted on 07/22/2020 with pneumonia.  Pharmacy has been consulted for vancomycin and zosyn dosing.  Plan: Vancomycin 1500 mg IV x 1, then 1000 mg IV q24h Add MRSA PCR Zosyn 3.375g IV every 8 hours (extended infusion) Monitor renal function, Cx and clinical progression to narrow Vancomycin trough as needed     Temp (24hrs), Avg:97.8 F (36.6 C), Min:97.8 F (36.6 C), Max:97.8 F (36.6 C)  Recent Labs  Lab 07/22/20 1628 07/22/20 1710  WBC 11.1*  --   CREATININE 1.41* 1.40*    CrCl cannot be calculated (Unknown ideal weight.).    No Known Allergies  Bertis Ruddy, PharmD Clinical Pharmacist ED Pharmacist Phone # [phone] 07/22/2020 9:28 PM

## 2020-07-22 NOTE — ED Triage Notes (Signed)
Per EMS, Pt presenting for Nursing Home. Earlier today pt had brief period of apnea and became unresponsive. Staff performed CPR x 5 minutes before EMS arrival. On scene, Carotid pulse identified and CPR stopped.   On arrival, Pt with GCS 14 on nonrebreather.

## 2020-07-22 NOTE — Progress Notes (Signed)
ANTICOAGULATION CONSULT NOTE - Initial Consult  Pharmacy Consult for heparin Indication: recent h/o PE/DVT  No Known Allergies  Patient Measurements: Height: 6\' 7"  (200.7 cm) Weight: 90.7 kg (200 lb) IBW/kg (Calculated) : 93.7  Vital Signs: Temp: 97.8 F (36.6 C) (12/25 1700) BP: 110/73 (12/25 2215) Pulse Rate: 96 (12/25 2215)  Labs: Recent Labs    07/22/20 1628 07/22/20 1707 07/22/20 1710 07/22/20 1839  HGB 10.9* 12.6*  --   --   HCT 38.1* 37.0*  --   --   PLT 401*  --   --   --   CREATININE 1.41*  --  1.40*  --   TROPONINIHS 13  --   --  12    Estimated Creatinine Clearance: 60.3 mL/min (A) (by C-G formula based on SCr of 1.4 mg/dL (H)).   Medical History: Past Medical History:  Diagnosis Date   Arthritis    trouble turning head side to side and up   Diabetes mellitus without complication (HCC)    Hemorrhoids    High cholesterol    Hypertension    Prostate cancer [facility])     Assessment: 74yo male presents from NH after period of apnea and unresponsiveness, admitted for acute respiratory failure and sepsis, to transition from Eliquis to heparin for PE/DVT (Oct 2021) while unable to take PO meds; last dose of Eliquis taken 12/25 9a.  Goal of Therapy:  Heparin level 0.3-0.7 units/ml aPTT 66-102 seconds Monitor platelets by anticoagulation protocol: Yes   Plan:  Will start heparin gtt at 1200 units/hr and monitor heparin levels, aPTT (while Eliquis affects anti-Xa), and CBC.  Wynona Neat, PharmD, BCPS  07/22/2020,11:02 PM

## 2020-07-22 NOTE — ED Notes (Signed)
Per MD orders PO meds held as patient is hard to arouse.

## 2020-07-23 ENCOUNTER — Inpatient Hospital Stay: Payer: Self-pay

## 2020-07-23 DIAGNOSIS — Z66 Do not resuscitate: Secondary | ICD-10-CM | POA: Diagnosis not present

## 2020-07-23 DIAGNOSIS — E43 Unspecified severe protein-calorie malnutrition: Secondary | ICD-10-CM | POA: Diagnosis present

## 2020-07-23 DIAGNOSIS — Z515 Encounter for palliative care: Secondary | ICD-10-CM

## 2020-07-23 DIAGNOSIS — J9601 Acute respiratory failure with hypoxia: Secondary | ICD-10-CM | POA: Diagnosis not present

## 2020-07-23 DIAGNOSIS — A419 Sepsis, unspecified organism: Secondary | ICD-10-CM | POA: Diagnosis not present

## 2020-07-23 DIAGNOSIS — J690 Pneumonitis due to inhalation of food and vomit: Secondary | ICD-10-CM | POA: Diagnosis not present

## 2020-07-23 LAB — BASIC METABOLIC PANEL
Anion gap: 10 (ref 5–15)
BUN: 29 mg/dL — ABNORMAL HIGH (ref 8–23)
CO2: 33 mmol/L — ABNORMAL HIGH (ref 22–32)
Calcium: 8.5 mg/dL — ABNORMAL LOW (ref 8.9–10.3)
Chloride: 95 mmol/L — ABNORMAL LOW (ref 98–111)
Creatinine, Ser: 1.37 mg/dL — ABNORMAL HIGH (ref 0.61–1.24)
GFR, Estimated: 54 mL/min — ABNORMAL LOW (ref >60)
Glucose, Bld: 211 mg/dL — ABNORMAL HIGH (ref 70–99)
Potassium: 4.8 mmol/L (ref 3.5–5.1)
Sodium: 138 mmol/L (ref 135–145)

## 2020-07-23 LAB — GLUCOSE, CAPILLARY
Glucose-Capillary: 214 mg/dL — ABNORMAL HIGH (ref 70–99)
Glucose-Capillary: 176 mg/dL — ABNORMAL HIGH (ref 70–99)
Glucose-Capillary: 208 mg/dL — ABNORMAL HIGH (ref 70–99)
Glucose-Capillary: 151 mg/dL — ABNORMAL HIGH (ref 70–99)

## 2020-07-23 LAB — CBC
HCT: 35.3 % — ABNORMAL LOW (ref 39.0–52.0)
Hemoglobin: 10.5 g/dL — ABNORMAL LOW (ref 13.0–17.0)
MCH: 28.2 pg (ref 26.0–34.0)
MCHC: 29.7 g/dL — ABNORMAL LOW (ref 30.0–36.0)
MCV: 94.6 fL (ref 80.0–100.0)
Platelets: 395 10*3/uL (ref 150–400)
RBC: 3.73 MIL/uL — ABNORMAL LOW (ref 4.22–5.81)
RDW: 16.2 % — ABNORMAL HIGH (ref 11.5–15.5)
WBC: 16.5 10*3/uL — ABNORMAL HIGH (ref 4.0–10.5)
nRBC: 0 % (ref 0.0–0.2)

## 2020-07-23 LAB — HIV ANTIBODY (ROUTINE TESTING W REFLEX): HIV Screen 4th Generation wRfx: NONREACTIVE

## 2020-07-23 LAB — APTT
aPTT: 55 seconds — ABNORMAL HIGH (ref 24–36)
aPTT: 83 seconds — ABNORMAL HIGH (ref 24–36)

## 2020-07-23 LAB — MAGNESIUM: Magnesium: 1.8 mg/dL (ref 1.7–2.4)

## 2020-07-23 LAB — HEPARIN LEVEL (UNFRACTIONATED): Heparin Unfractionated: >2.2 IU/mL — ABNORMAL HIGH (ref 0.30–0.70)

## 2020-07-23 LAB — MRSA PCR SCREENING: MRSA by PCR: NEGATIVE

## 2020-07-23 MED ORDER — SODIUM CHLORIDE 0.9% FLUSH
10.0000 mL | Freq: Two times a day (BID) | INTRAVENOUS | Status: DC
  Administered 2020-07-23 – 2020-07-31 (×8): 10 mL

## 2020-07-23 MED ORDER — IPRATROPIUM-ALBUTEROL 0.5-2.5 (3) MG/3ML IN SOLN
3.0000 mL | Freq: Two times a day (BID) | RESPIRATORY_TRACT | Status: DC
  Administered 2020-07-23 – 2020-07-24 (×3): 3 mL via RESPIRATORY_TRACT
  Filled 2020-07-23 (×4): qty 3

## 2020-07-23 MED ORDER — CHLORHEXIDINE GLUCONATE CLOTH 2 % EX PADS
6.0000 | MEDICATED_PAD | Freq: Every day | CUTANEOUS | Status: DC
  Administered 2020-07-24 – 2020-07-31 (×5): 6 via TOPICAL

## 2020-07-23 MED ORDER — SODIUM CHLORIDE 0.9% FLUSH: 10.0000 mL | INTRAVENOUS | Status: DC | PRN

## 2020-07-23 MED ORDER — SODIUM CHLORIDE 0.9 % IV SOLN: INTRAVENOUS | Status: DC

## 2020-07-23 NOTE — Progress Notes (Signed)
Peripherally Inserted Central Catheter Placement  The IV Nurse has discussed with the patient and/or persons authorized to consent for the patient, the purpose of this procedure and the potential benefits and risks involved with this procedure.  The benefits include less needle sticks, lab draws from the catheter, and the patient may be discharged home with the catheter. Risks include, but not limited to, infection, bleeding, blood clot (thrombus formation), and puncture of an artery; nerve damage and irregular heartbeat and possibility to perform a PICC exchange if needed/ordered by physician.  Alternatives to this procedure were also discussed.  Bard Power PICC patient education guide, fact sheet on infection prevention and patient information card has been provided to patient /or left at bedside. Consent obtained from nephew at bedside.   PICC Placement Documentation  PICC Double Lumen 07/23/20 PICC Right Brachial 42 cm 0 cm (Active)  Indication for Insertion or Continuance of Line Limited venous access - need for IV therapy >5 days (PICC only) 07/23/20 1649  Exposed Catheter (cm) 0 cm 07/23/20 1649  Site Assessment Clean;Dry;Intact 07/23/20 1649  Lumen #1 Status Flushed;Saline locked;Blood return noted 07/23/20 1649  Lumen #2 Status Flushed;Saline locked;Blood return noted 07/23/20 1649  Dressing Type Transparent 07/23/20 1649  Dressing Status Clean;Dry;Intact 07/23/20 1649  Antimicrobial disc in place? Yes 07/23/20 1649  Safety Lock Not Applicable 16/10/96 0454  Line Care Connections checked and tightened 07/23/20 1649  Line Adjustment (NICU/IV Team Only) No 07/23/20 1649  Dressing Intervention New dressing 07/23/20 1649  Dressing Change Due 07/30/20 07/23/20 1649       Jay Holloway 07/23/2020, 4:50 PM

## 2020-07-23 NOTE — Progress Notes (Addendum)
ANTICOAGULATION CONSULT NOTE - Initial Consult  Pharmacy Consult for heparin Indication: recent h/o PE/DVT 04/2020  No Known Allergies  Patient Measurements: Height: 6\' 7"  (200.7 cm) Weight: 98.4 kg (216 lb 14.9 oz) IBW/kg (Calculated) : 93.7  Vital Signs: Temp: 98 F (36.7 C) (12/26 0800) Temp Source: Oral (12/26 0800) BP: 89/64 (12/26 1200) Pulse Rate: 90 (12/26 1200)  Labs: Recent Labs    07/22/20 1628 07/22/20 1707 07/22/20 1710 07/22/20 1839 07/23/20 0104 07/23/20 0847  HGB 10.9* 12.6*  --   --  10.5*  --   HCT 38.1* 37.0*  --   --  35.3*  --   PLT 401*  --   --   --  395  --   APTT  --   --   --   --   --  55*  HEPARINUNFRC  --   --   --   --   --  >2.20*  CREATININE 1.41*  --  1.40*  --  1.37*  --   TROPONINIHS 13  --   --  12  --   --     Estimated Creatinine Clearance: 63.6 mL/min (A) (by C-G formula based on SCr of 1.37 mg/dL (H)).   Medical History: Past Medical History:  Diagnosis Date  . Arthritis    trouble turning head side to side and up  . Diabetes mellitus without complication ([place])   . Hemorrhoids   . High cholesterol   . Hypertension   . Prostate cancer [facility])     Assessment: 74yo male presents from NH after period of apnea and unresponsiveness, admitted for acute respiratory failure and sepsis, to transition from Eliquis to heparin for PE/DVT (Oct 2021) while unable to take PO meds; last dose of Eliquis taken 12/25 9a.   aPTT 55 is subtherapeutic. HL >2.2 not correlating with aPTT. H/H dropped, plt stable, fluid ordered, no reported bleeding. Patient lost IV access, PICC pending, RN to increase heparin when able.   Goal of Therapy:  Heparin level 0.3-0.7 units/ml aPTT 66-102 seconds Monitor platelets by anticoagulation protocol: Yes   Plan:  Increase heparin to 1350 units/hr F/u 6hr aPTT F/u aPTT until correlates with heparin level  Monitor daily aPTT, HL, CBC/plt Monitor for signs/symptoms of bleeding  F/u restart  apixaban  Benetta Spar, PharmD, BCPS, BCCP Clinical Pharmacist  Please check AMION for all [place] phone numbers After 10:00 PM, call [place]

## 2020-07-23 NOTE — Progress Notes (Signed)
[facility] for heparin Indication: recent h/o PE/DVT 04/2020  Labs: Recent Labs    07/22/20 1628 07/22/20 1707 07/22/20 1710 07/22/20 1839 07/23/20 0104 07/23/20 0847 07/23/20 2100  HGB 10.9* 12.6*  --   --  10.5*  --   --   HCT 38.1* 37.0*  --   --  35.3*  --   --   PLT 401*  --   --   --  395  --   --   APTT  --   --   --   --   --  55* 83*  HEPARINUNFRC  --   --   --   --   --  >2.20*  --   CREATININE 1.41*  --  1.40*  --  1.37*  --   --   TROPONINIHS 13  --   --  12  --   --   --     Assessment: 74yo male presents from NH after period of apnea and unresponsiveness, admitted for acute respiratory failure and sepsis, to transition from Eliquis to heparin for PE/DVT (Oct 2021) while unable to take PO meds; last dose of Eliquis taken 12/25 9a.  aPTT 83 sec  Goal of Therapy:  Heparin level 0.3-0.7 units/ml aPTT 66-102 seconds Monitor platelets by anticoagulation protocol: Yes   Plan:  Continue heparin at 1350 units/hr F/u aPTT until correlates with heparin level  Monitor daily aPTT, HL, CBC/plt Monitor for signs/symptoms of bleeding  F/u restart apixaban  Thanks for allowing pharmacy to be a part of this patient's care.  Excell Seltzer, PharmD Clinical Pharmacist

## 2020-07-23 NOTE — Progress Notes (Signed)
PROGRESS NOTE  Jay Holloway [id number] DOB: 1946-03-25 DOA: 07/22/2020 PCP: [name], MD  HPI/Recap of past 28 hours: 74 year old male with past medical history of hypertension, diabetes mellitus, diastolic CHF, DVT/PE on Eliquis and stage IIIa chronic kidney disease presented from a skilled nursing facility after an episode of unresponsiveness.  There was a possible cardiac arrest at his nursing facility as they were unable to palpate a pulse and patient went 5 minutes CPR but upon arrival of EMS, patient was awake and with pulses.  Looks to be somewhat disoriented.  Oxygen saturations are 83%.  Brought into the emergency room and found to have sepsis secondary to pneumonia aspiration pneumonia with acute respiratory failure.  Admitted to the hospitalist service.  This morning, patient is somewhat awake.  Interactive.  Complains of feeling very tired and a little short of breath.  He has had previous admissions for dysphagia and is at high risk for recurrent aspiration.  Seen by speech therapy and placed on dysphagia 3 although this will not be complete.  Seen by palliative care and discussion with patient and his nephew done.  Follow-up discussion planned for tomorrow.  In the interim, IV access lost and PICC line ordered.  Assessment/Plan: Principal Problem: Severe sepsis ([place]) from aspiration pneumonia from dysphagia causing acute respiratory failure with hypoxia and hypercapnia: Patient meets criteria given respiratory failure, pneumonia source and SIRS criteria.  Improving, still on 2 L nasal cannula.  His long-term aspiration risks remain high.  Palliative care following and further goals of care will be decided tomorrow.  Currently DNR. Active Problems:   Essential hypertension: Blood pressures slightly soft.   GERD without esophagitis    HCAP (healthcare-associated pneumonia)   Chronic diastolic CHF (congestive heart failure) (HCC) mild elevation of BNP.  However nothing seen on  chest x-ray or CT in form of fluid.  Suspect that this mild elevation is due more to his renal disease and respiratory failure issues rather than volume overload.  Especially in the context of sepsis would favor the fluid resuscitation.  We will plan to diurese later.  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  CBGs mildly elevated in the context of his acute illness.  A1c done 2-1/2 months ago elevated at 7.8.   Decubital ulcer: Present on admission.  Wound care.   Unresponsiveness   HLD (hyperlipidemia)   CKD (chronic kidney disease), stage IIIa   Aspiration pneumonia (HCC)   Severe protein-calorie malnutrition ([place]): Nutrition to see.   Code Status: DNR, further discussion to be had tomorrow  Family Communication: Left message with nephew  Disposition Plan: Determined based off of further care discussions   Consultants:  Palliative care  Procedures:  PICC line placement 12/26  Antimicrobials:  IV vancomycin and Zosyn 12/25-present  DVT prophylaxis: Heparin infusion   Objective: Vitals:   07/23/20 0800 07/23/20 1200  BP: 103/67 (!) 89/64  Pulse: 92 90  Resp: 15 18  Temp: 98 F (36.7 C)   SpO2: 98% 96%    Intake/Output Summary (Last 24 hours) at 07/23/2020 1333 Last data filed at 07/23/2020 0848 Gross per 24 hour  Intake --  Output 500 ml  Net -500 ml   Filed Weights   07/22/20 2300 07/22/20 2321  Weight: 90.7 kg 98.4 kg   Body mass index is 24.44 kg/m.  Exam:   General: Fatigued, oriented x2, no acute distress  HEENT: Normocephalic, atraumatic,  mucous membranes are dry, poor dentition  Cardiovascular: Regular rate and rhythm, S1-S2, occasional ectopic beat  Respiratory: Scattered rails  Abdomen: Soft, nontender, nondistended, hypoactive bowel sounds  Musculoskeletal: No clubbing or cyanosis, trace pitting edema  Psychiatry:  Appropriate, no evidence of psychoses   Data Reviewed: CBC: Recent Labs  Lab 07/22/20 1628 07/22/20 1707 07/23/20 0104  WBC 11.1*  --  16.5*  NEUTROABS 6.5  --   --   HGB 10.9* 12.6* 10.5*  HCT 38.1* 37.0* 35.3*  MCV 94.5  --  94.6  PLT 401*  --  160   Basic Metabolic Panel: Recent Labs  Lab 07/22/20 1628 07/22/20 1707 07/22/20 1710 07/23/20 0104  NA 138 136  --  138  K 4.8 4.6  --  4.8  CL 97*  --   --  95*  CO2 32  --   --  33*  GLUCOSE 200*  --   --  211*  BUN 30*  --   --  29*  CREATININE 1.41*  --  1.40* 1.37*  CALCIUM 8.5*  --   --  8.5*  MG  --   --   --  1.8   GFR: Estimated Creatinine Clearance: 63.6 mL/min (A) (by C-G formula based on SCr of 1.37 mg/dL (H)). Liver Function Tests: Recent Labs  Lab 07/22/20 1628  AST 36  ALT 45*  ALKPHOS 116  BILITOT 0.5  PROT 5.7*  ALBUMIN 2.4*   No results for input(s): LIPASE, AMYLASE in the last 168 hours. No results for input(s): AMMONIA in the last 168 hours. Coagulation Profile: No results for input(s): INR, PROTIME in the last 168 hours. Cardiac Enzymes: No results for input(s): CKTOTAL, CKMB, CKMBINDEX, TROPONINI in the last 168 hours. BNP (last 3 results) No results for input(s): PROBNP in the last 8760 hours. HbA1C: No results for input(s): HGBA1C in the last 72 hours. CBG: Recent Labs  Lab 07/22/20 1640 07/22/20 2218 07/23/20 0830 07/23/20 1139  GLUCAP 188* 169* 214* 176*   Lipid Profile: No results for input(s): CHOL, HDL, LDLCALC, TRIG, CHOLHDL, LDLDIRECT in the last 72 hours. Thyroid Function Tests: No results for input(s): TSH, T4TOTAL, FREET4, T3FREE, THYROIDAB in the last 72 hours. Anemia Panel: No results for input(s): VITAMINB12, FOLATE, FERRITIN, TIBC, IRON, RETICCTPCT in the last 72 hours. Urine analysis:    Component Value Date/Time   COLORURINE AMBER (A) 04/28/2020 2016   APPEARANCEUR CLOUDY (A) 04/28/2020 2016   LABSPEC 1.020 04/28/2020 2016   PHURINE 5.0 04/28/2020 2016    GLUCOSEU NEGATIVE 04/28/2020 2016   HGBUR NEGATIVE 04/28/2020 2016   [place] NEGATIVE 04/28/2020 2016   KETONESUR NEGATIVE 04/28/2020 2016   PROTEINUR 100 (A) 04/28/2020 2016   UROBILINOGEN 0.2 11/05/2014 1712   NITRITE NEGATIVE 04/28/2020 2016   LEUKOCYTESUR LARGE (A) 04/28/2020 2016   Sepsis Labs: @LABRCNTIP (procalcitonin:4,lacticidven:4)  ) Recent Results (from the past 240 hour(s))  Resp Panel by RT-PCR (Flu A&B, Covid) Nasopharyngeal Swab     Status: None   Collection Time: 07/22/20  4:37 PM   Specimen: Nasopharyngeal Swab; Nasopharyngeal(NP) swabs in vial transport medium  Result Value Ref Range Status   SARS Coronavirus 2 by RT PCR NEGATIVE NEGATIVE Final    Comment: (NOTE) SARS-CoV-2 target nucleic acids are NOT DETECTED.  The SARS-CoV-2 RNA is generally detectable in upper respiratory specimens during the acute phase of infection. The lowest concentration of SARS-CoV-2 viral copies this assay can detect is 138 copies/mL. A negative result does not preclude SARS-Cov-2 infection and  should not be used as the sole basis for treatment or other patient management decisions. A negative result may occur with  improper specimen collection/handling, submission of specimen other than nasopharyngeal swab, presence of viral mutation(s) within the areas targeted by this assay, and inadequate number of viral copies(<138 copies/mL). A negative result must be combined with clinical observations, patient history, and epidemiological information. The expected result is Negative.  Fact Sheet for Patients:  EntrepreneurPulse.com.au  Fact Sheet for Healthcare Providers:  IncredibleEmployment.be  This test is no t yet approved or cleared by the [place] FDA and  has been authorized for detection and/or diagnosis of SARS-CoV-2 by FDA under an Emergency Use Authorization (EUA). This EUA will remain  in effect (meaning this test can be used) for  the duration of the COVID-19 declaration under Section 564(b)(1) of the Act, 21 U.S.C.section 360bbb-3(b)(1), unless the authorization is terminated  or revoked sooner.       Influenza A by PCR NEGATIVE NEGATIVE Final   Influenza B by PCR NEGATIVE NEGATIVE Final    Comment: (NOTE) The Xpert Xpress SARS-CoV-2/FLU/RSV plus assay is intended as an aid in the diagnosis of influenza from Nasopharyngeal swab specimens and should not be used as a sole basis for treatment. Nasal washings and aspirates are unacceptable for Xpert Xpress SARS-CoV-2/FLU/RSV testing.  Fact Sheet for Patients: EntrepreneurPulse.com.au  Fact Sheet for Healthcare Providers: IncredibleEmployment.be  This test is not yet approved or cleared by the [place] FDA and has been authorized for detection and/or diagnosis of SARS-CoV-2 by FDA under an Emergency Use Authorization (EUA). This EUA will remain in effect (meaning this test can be used) for the duration of the COVID-19 declaration under Section 564(b)(1) of the Act, 21 U.S.C. section 360bbb-3(b)(1), unless the authorization is terminated or revoked.  Performed at [facility], [place] [address] [place], [place] [address]   MRSA PCR Screening     Status: None   Collection Time: 07/23/20 12:00 AM   Specimen: Nasopharyngeal  Result Value Ref Range Status   MRSA by PCR NEGATIVE NEGATIVE Final    Comment:        The GeneXpert MRSA Assay (FDA approved for NASAL specimens only), is one component of a comprehensive MRSA colonization surveillance program. It is not intended to diagnose MRSA infection nor to guide or monitor treatment for MRSA infections. Performed at [facility], [place] [address] [place], [place] [address]       Studies: DG Chest 1 View  Result Date: 07/22/2020 CLINICAL DATA:  Absent RIGHT-side breath sounds EXAM: CHEST  1 VIEW COMPARISON:  Portable exam 1631 hours compared to  07/03/2020 FINDINGS: Normal heart size, mediastinal contours, and pulmonary vascularity. Marked elevation of RIGHT diaphragm with bowel interposition. Atelectasis and/or infiltrate at RIGHT base. Remaining lungs clear. No pleural effusion or pneumothorax. Gaseous distention of stomach. IMPRESSION: Elevation of RIGHT diaphragm with atelectasis and/or infiltrate at RIGHT lung base. Gaseous distention of stomach. Electronically Signed   By: Lavonia Dana M.D.   On: 07/22/2020 16:37   CT Head Wo Contrast  Result Date: 07/22/2020 CLINICAL DATA:  Delirium; history diabetes mellitus, hypertension, prostate cancer EXAM: CT HEAD WITHOUT CONTRAST TECHNIQUE: Contiguous axial images were obtained from the base of the skull through the vertex without intravenous contrast. Sagittal and coronal MPR images reconstructed from axial data set. COMPARISON:  04/28/2020 FINDINGS: Brain: Generalized atrophy. Diffuse prominence of the ventricular system consistent with atrophy, unchanged. No midline shift or mass effect. Mild small vessel  chronic ischemic changes of deep cerebral white matter. No intracranial hemorrhage, mass lesion, or evidence of acute infarction. Vascular: No hyperdense vessels Skull: Intact Sinuses/Orbits: Clear Other: N/A IMPRESSION: Atrophy with small vessel chronic ischemic changes of deep cerebral white matter. No acute intracranial abnormalities. Electronically Signed   By: Lavonia Dana M.D.   On: 07/22/2020 19:44   CT Angio Chest PE W and/or Wo Contrast  Result Date: 07/22/2020 CLINICAL DATA:  Shortness of breath, delirium, question pulmonary embolism EXAM: CT ANGIOGRAPHY CHEST WITH CONTRAST TECHNIQUE: Multidetector CT imaging of the chest was performed using the standard protocol during bolus administration of intravenous contrast. Multiplanar CT image reconstructions and MIPs were obtained to evaluate the vascular anatomy. CONTRAST:  30mL OMNIPAQUE IOHEXOL 350 MG/ML SOLN IV COMPARISON:  04/30/2020  FINDINGS: Cardiovascular: Atherosclerotic calcifications aorta and coronary arteries. Aorta normal caliber without aneurysm or dissection. Borderline for LEFT ventricular hypertrophy. No pericardial effusion. Pulmonary arteries adequately opacified. Lower lobe pulmonary artery branches are suboptimally assessed due to consolidation and beam hardening. No definite pulmonary emboli identified. Mediastinum/Nodes: Esophagus unremarkable. Base of cervical region normal appearance. No thoracic adenopathy. Lungs/Pleura: Significant elevation of RIGHT diaphragm with bowel interposition. Consolidation involving RIGHT lower lobe as well as posterior segment of RIGHT upper lobe consistent with pneumonia. Consolidation in medial LEFT lower lobe as well. Minimal lingular infiltrate. Small RIGHT pleural effusion. No pneumothorax. Upper Abdomen: Bowel interposition. Scattered foci of gas are seen under the RIGHT diaphragm which appear to be related to bowel loops. No definite free air. Visualized portion of liver unremarkable. Musculoskeletal: No acute osseous findings. Diffuse ossification of the interspinous ligament. Review of the MIP images confirms the above findings. IMPRESSION: No definite evidence of pulmonary embolism, with assessment of lower lobe pulmonary artery branches limited due to beam hardening. Significant elevation of RIGHT diaphragm with bowel interposition. BILATERAL lower lobe consolidation consistent with pneumonia, greater on RIGHT. Small RIGHT pleural effusion. Aortic Atherosclerosis (ICD10-I70.0). Electronically Signed   By: Lavonia Dana M.D.   On: 07/22/2020 19:58   [place] EKG SITE RITE  Result Date: 07/23/2020 If Site Rite image not attached, placement could not be confirmed due to current cardiac rhythm.   Scheduled Meds: . atorvastatin  10 mg Oral Daily  . cholecalciferol  1,000 Units Oral Daily  . feeding supplement  237 mL Oral BID BM  . furosemide  20 mg Oral Daily  . gabapentin  100 mg  Oral TID  . insulin aspart  0-5 Units Subcutaneous QHS  . insulin aspart  0-9 Units Subcutaneous TID WC  . insulin glargine  7 Units Subcutaneous Daily  . ipratropium-albuterol  3 mL Nebulization BID  . magnesium oxide  400 mg Oral Daily  . multivitamin with minerals  1 tablet Oral Daily  . pantoprazole  40 mg Oral BID    Continuous Infusions: . heparin 1,200 Units/hr (07/22/20 2338)  . piperacillin-tazobactam (ZOSYN)  IV 3.375 g (07/23/20 [id number])  . vancomycin       LOS: 1 day     Annita Brod, MD [facility]   07/23/2020, 1:33 PM

## 2020-07-23 NOTE — Plan of Care (Signed)

## 2020-07-23 NOTE — Evaluation (Signed)
Clinical/Bedside Swallow Evaluation Patient Details  Name: Jay Holloway MRN: [id number] Date of Birth: [birth year]  Today's Date: 07/23/2020 Time: SLP Start Time (ACUTE ONLY): 1030 SLP Stop Time (ACUTE ONLY): 1047 SLP Time Calculation (min) (ACUTE ONLY): 17 min  Past Medical History:  Past Medical History:  Diagnosis Date  . Arthritis    trouble turning head side to side and up  . Diabetes mellitus without complication ([place])   . Hemorrhoids   . High cholesterol   . Hypertension   . Prostate cancer [facility])    Past Surgical History:  Past Surgical History:  Procedure Laterality Date  . CHOLECYSTECTOMY N/A 11/11/2014   Procedure: LAPAROSCOPIC CHOLECYSTECTOMY WITH INTRAOPERATIVE CHOLANGIOGRAM;  Surgeon: Erroll Luna, MD;  Location: [place];  Service: General;  Laterality: N/A;  . COLONOSCOPY N/A 11/09/2014   Procedure: COLONOSCOPY;  Surgeon: Juanita Craver, MD;  Location: [facility] ENDOSCOPY;  Service: Endoscopy;  Laterality: N/A;  . CYSTOSCOPY WITH RETROGRADE PYELOGRAM, URETEROSCOPY AND STENT PLACEMENT Left 04/18/2014   Procedure: CYSTOSCOPY WITH RETROGRADE PYELOGRAM, URETEROSCOPY AND STENT PLACEMENT;  Surgeon: Raynelle Bring, MD;  Location: [facility] ORS;  Service: Urology;  Laterality: Left;  . HOLMIUM LASER APPLICATION Left 3/38/2505   Procedure: HOLMIUM LASER APPLICATION;  Surgeon: Raynelle Bring, MD;  Location: [facility];  Service: Urology;  Laterality: Left;  . LYMPHADENECTOMY  06/04/2012   Procedure: LYMPHADENECTOMY;  Surgeon: Dutch Gray, MD;  Location: [facility];  Service: Urology;  Laterality: Bilateral;  . right  achilles tendon repair  yrs ago  . ROBOT ASSISTED LAPAROSCOPIC RADICAL PROSTATECTOMY  06/04/2012   Procedure: ROBOTIC ASSISTED LAPAROSCOPIC RADICAL PROSTATECTOMY LEVEL 3;  Surgeon: Dutch Gray, MD;  Location: [facility];  Service: Urology;  Laterality: N/A;   HPI:  Pt is a 74 y.o. 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 5 minutes of CPR completed. CT head was negative. CXR 12/25: Elevation of RIGHT diaphragm with atelectasis and/or infiltrate at RIGHT lung base. MBS completed on 10/4: moderate oral and gross pharyngeal dysphagia which was much worse than prior MBS 03/2020 with aspiration of liquids and rentention of barium in the esophagus. An NPO status was recommended at that time. Pt was seen by SLP on 05/04/20 and a dysphagia 3 diet was recommended with pt's acceptance of known aspiration risk.   Assessment / Plan / Recommendation Clinical Impression  Pt was seen for bedside swallow evaluation. Oral mechanism exam was limited due to pt's difficulty following commands; labial strength was functional and dentition was reduced. He exhibited signs of aspiration and symptoms of pharyngeal residue across all solid and liquid consistencies. Frequency of throat clearing and coughing was least with nectar thick liquids, but still present. Pt was on dysphagia 3 solids and thin liquids with known aspiration risk during admission in October, 2021. Pt is currently symptomatic of aspiration across consistencies and it appears that palliative care has been consulted. Considering pt/family's recent desire for him to have a p.o. diet with known aspiration risk, SLP will modify his diet to dysphagia 3 solids due to reduced dentition. SLP will follow pt and possibly adjust recommendations/modify plan of care pending pt/family's decision on [facility]. SLP Visit Diagnosis: Dysphagia, unspecified (R13.10)    Aspiration Risk  Moderate aspiration risk    Diet Recommendation Dysphagia 3 (Mech soft);Thin liquid   Liquid Administration via: Cup;Straw Medication Administration: Crushed with puree Supervision: Staff to assist with self feeding  Compensations: Slow rate;Small sips/bites Postural Changes: Seated upright at 90  degrees    Other  Recommendations Oral Care Recommendations: Oral care BID   Follow up Recommendations Skilled Nursing facility      Frequency and Duration min 2x/week  2 weeks       Prognosis Prognosis for Safe Diet Advancement: Fair Barriers to Reach Goals: Cognitive deficits;Severity of deficits      Swallow Study   General Date of Onset: 05/01/20 HPI: Pt is a 74 y.o. 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 5 minutes of CPR completed. CT head was negative. CXR 12/25: Elevation of RIGHT diaphragm with atelectasis and/or infiltrate at RIGHT lung base. MBS completed on 10/4: moderate oral and gross pharyngeal dysphagia which was much worse than prior MBS 03/2020 with aspiration of liquids and rentention of barium in the esophagus. An NPO status was recommended at that time. Pt was seen by SLP on 05/04/20 and a dysphagia 3 diet was recommended with pt's acceptance of known aspiration risk. Type of Study: Bedside Swallow Evaluation Previous Swallow Assessment: See HPI Diet Prior to this Study: Regular;Thin liquids Temperature Spikes Noted: No Respiratory Status: Nasal cannula History of Recent Intubation: No Behavior/Cognition: Alert;Cooperative;Pleasant mood Oral Cavity Assessment: Within Functional Limits Oral Care Completed by SLP: No Oral Cavity - Dentition: Adequate natural dentition Vision: Functional for self-feeding Self-Feeding Abilities: Needs assist Patient Positioning: Upright in bed;Postural control adequate for testing Volitional Cough: Cognitively unable to elicit Volitional Swallow: Unable to elicit    Oral/Motor/Sensory Function Overall Oral Motor/Sensory Function:  (Pt unable to fully participate)   Ice Chips Ice chips: Impaired Pharyngeal Phase Impairments: Cough -  Delayed   Thin Liquid Thin Liquid: Impaired Pharyngeal  Phase Impairments: Throat Clearing - Immediate;Throat Clearing - Delayed;Multiple swallows    Nectar Thick Nectar Thick Liquid: Impaired Presentation: Cup;Straw Pharyngeal Phase Impairments: Multiple swallows;Throat Clearing - Delayed;Throat Clearing - Immediate;Cough - Immediate;Wet Vocal Quality;Cough - Delayed   Honey Thick Honey Thick Liquid: Impaired Pharyngeal Phase Impairments: Cough - Immediate;Cough - Delayed   Puree Puree: Impaired Pharyngeal Phase Impairments: Cough - Immediate;Multiple swallows   Solid     Solid: Impaired Pharyngeal Phase Impairments: Multiple swallows;Cough - Delayed     Mysti Haley I. Vear Clock, MS, CCC-SLP Acute Rehabilitation Services Office number [phone] Pager [phone]   Scheryl Marten 07/23/2020,11:03 AM

## 2020-07-23 NOTE — Plan of Care (Signed)

## 2020-07-23 NOTE — Progress Notes (Signed)
Secure chat with Dr Maryland Pink re PICC placement with pending blood cultures , afebrile, elevated WBC's.  Recommends to proceed with PICC for pt to have IV access (currently does not have IV).  April RN aware and is to notify PICC RN once consent is obtained from family, due to pt's disorientation at times.

## 2020-07-23 NOTE — Consult Note (Signed)
Consultation Note Date: 07/23/2020   Patient Name: Jay Holloway  DOB: 1946/06/21  MRN: [id number]  Age / Sex: 74 y.o., male  PCP: Jay Gravel, MD Referring Physician: Annita Brod, MD  Reason for Consultation: Establishing goals of care  HPI/Patient Profile: 74 y.o. male  with past medical history of prostate CA, DM, d-CHF with EF of 60-65%, CKD3, PE in 04/2020 now on Eliquis, and recurrent aspiration, who was admitted on 07/22/2020 with concern for out of hospital arrest.  He received 5 minutes of CPR at SNF.  On admission to the ER he was found to be in hypoxic and hypercarbic respiratory failure with aspiration pneumonia vs HCAP. He is more alert today and asking "how long have I been out?".  He is lethargic and somewhat disorientated.  He repeatedly asks me for water and to "call Jay Holloway".  SLP worked with him this morning.  He had signs of aspiration across all solid and liquid consistencies.  Diet was recommended as family desires for him to be fed despite the risk of aspiration.  Clinical Assessment and Goals of Care:  I have reviewed medical records including EPIC notes, labs and imaging, received report from bedside RN, examined the patient and spoke on the phone with patient's nephew Jay Holloway  to discuss diagnosis prognosis, Jay Holloway, EOL wishes, disposition and options.  I introduced Palliative Medicine as specialized medical care for people living with serious illness. It focuses on providing relief from the symptoms and stress of a serious illness.   We discussed a brief life review of the patient. Jay Holloway has not children and is not married.  He has a long term girl friend, Jay Holloway.  Per Jay Holloway, he and Jay Holloway know each other and are in close communication regarding Jay Holloway.  Jay Holloway currently resides at [facility].  The patient tells me this is the  second time he has had pneumonia and passed out.  Jay Holloway confirms this is true.  As far as functional and nutritional status at SNF he had been working with PT and as of 06/30/20 was able to stand, pivot and transfer.  His albumin has been in the mid 2 range for two and a half months indicating his nutrition is chronically poor.  We discussed his current illness and what it means in the larger context of his on-going co-morbidities.  Natural disease trajectory and expectations at EOL were discussed.  Specifically we discussed recurrent aspiration pneumonia being a terminal illness.    We made plans to meet in person tomorrow at 11:00 here at the hospital to talk about next steps and how to support [name] going forward.  I suggested if Jay Holloway wanted to invite Jay Holloway he is welcome to do so.  Questions and concerns were addressed.  The family was encouraged to call with questions or concerns.        Primary Decision Maker:  NEXT OF KIN Jay Holloway, Jay Holloway.    SUMMARY OF RECOMMENDATIONS  Would assist with feeding safest diet possible. Would utilize aspiration precautions. Patient was a confirmed DNR as of Epic note 06/30/2020 (followed by [facility] Palliative NP Laverda Sorenson).  He also has a DNR document in [place]. Wound is being tended in hospital.  Code Status/Advance Care Planning:  DNR   Symptom Management:   As above.  Additional Recommendations (Limitations, Scope, Preferences):  Will review MOST form with family tomorrow.  Palliative Prophylaxis:   Aspiration  Psycho-social/Spiritual:   Desire for further Chaplaincy support: Not discussed.  Prognosis:  Less than 6 months given 5 hospital admissions in 6 months, rapid decline, protein calorie malnutrition, recurrent aspiration pneumonia and hypoxic and hypercarbic respiratory failure.  He is eligible for Hospice at SNF if patient and family are of the hospice philosophy.  Discharge Planning: return  to SNF with either Palliative or Hospice Services as patient and family desire      Primary Diagnoses: Present on Admission: . Acute respiratory failure with hypoxia and hypercapnia (HCC) . HCAP (healthcare-associated pneumonia) . GERD without esophagitis . Essential hypertension . VTE (venous thromboembolism) . Chronic diastolic CHF (congestive heart failure) ([place]) . Type II diabetes mellitus with renal manifestations ([place]) . Sepsis ([place]) . Decubital ulcer . Unresponsiveness . HLD (hyperlipidemia) . CKD (chronic kidney disease), stage IIIa . Aspiration pneumonia ([place])   I have reviewed the medical record, interviewed the patient and family, and examined the patient. The following aspects are pertinent.  Past Medical History:  Diagnosis Date  . Arthritis    trouble turning head side to side and up  . Diabetes mellitus without complication ([place])   . Hemorrhoids   . High cholesterol   . Hypertension   . Prostate cancer [facility])    Social History   Socioeconomic History  . Marital status: Single    Spouse name: Not on file  . Number of children: Not on file  . Years of education: Not on file  . Highest education level: Not on file  Occupational History  . Occupation: retired  Tobacco Use  . Smoking status: Former Smoker    Packs/day: 0.25    Years: 4.00    Pack years: 1.00    Types: Cigarettes    Quit date: 12/16/1978    Years since quitting: 41.6  . Smokeless tobacco: Never Used  Substance and Sexual Activity  . Alcohol use: Not Currently    Comment: remote h/o heavy use  . Drug use: Not Currently    Types: Marijuana    Comment: 1970s  . Sexual activity: Not on file  Other Topics Concern  . Not on file  Social History Narrative  . Not on file   Social Determinants of Health   Financial Resource Strain: Not on file  Food Insecurity: Not on file  Transportation Needs: Not on file  Physical Activity: Not on file  Stress: Not on file  Social Connections:  Not on file   Family History  Family history unknown: Yes    No Known Allergies    Vital Signs: BP 103/67 (BP Location: Left Arm)   Pulse 92   Temp 98 F (36.7 C) (Oral)   Resp 15   Ht 6\' 7"  (2.007 m)   Wt 98.4 kg   SpO2 98%   BMI 24.44 kg/m  Pain Scale: 0-10   Pain Score: Asleep   SpO2: SpO2: 98 % O2 Device:SpO2: 98 % O2 Flow Rate: .O2 Flow Rate (L/min): 3 L/min    Palliative Assessment/Data: 30%  Time In: 11:00 Time Out: 12:10 Time Total: 70 mn. Visit consisted of counseling and education dealing with the complex and emotionally intense issues surrounding the need for palliative care and symptom management in the setting of serious and potentially life-threatening illness. Greater than 50%  of this time was spent counseling and coordinating care related to the above assessment and plan.  Signed by: Florentina Jenny, PA-C Palliative Medicine  Please contact Palliative Medicine Team phone at [phone] for questions and concerns.  For individual provider: See Shea Evans

## 2020-07-24 DIAGNOSIS — J9601 Acute respiratory failure with hypoxia: Secondary | ICD-10-CM | POA: Diagnosis not present

## 2020-07-24 DIAGNOSIS — I5031 Acute diastolic (congestive) heart failure: Secondary | ICD-10-CM

## 2020-07-24 DIAGNOSIS — Z515 Encounter for palliative care: Secondary | ICD-10-CM | POA: Diagnosis not present

## 2020-07-24 DIAGNOSIS — J690 Pneumonitis due to inhalation of food and vomit: Secondary | ICD-10-CM | POA: Diagnosis not present

## 2020-07-24 DIAGNOSIS — A419 Sepsis, unspecified organism: Secondary | ICD-10-CM | POA: Diagnosis not present

## 2020-07-24 LAB — CBC
HCT: 34.3 % — ABNORMAL LOW (ref 39.0–52.0)
Hemoglobin: 9.7 g/dL — ABNORMAL LOW (ref 13.0–17.0)
MCH: 27.1 pg (ref 26.0–34.0)
MCHC: 28.3 g/dL — ABNORMAL LOW (ref 30.0–36.0)
MCV: 95.8 fL (ref 80.0–100.0)
Platelets: 338 10*3/uL (ref 150–400)
RBC: 3.58 MIL/uL — ABNORMAL LOW (ref 4.22–5.81)
RDW: 15.6 % — ABNORMAL HIGH (ref 11.5–15.5)
WBC: 9.5 10*3/uL (ref 4.0–10.5)
nRBC: 0 % (ref 0.0–0.2)

## 2020-07-24 LAB — GLUCOSE, CAPILLARY
Glucose-Capillary: 148 mg/dL — ABNORMAL HIGH (ref 70–99)
Glucose-Capillary: 151 mg/dL — ABNORMAL HIGH (ref 70–99)
Glucose-Capillary: 131 mg/dL — ABNORMAL HIGH (ref 70–99)
Glucose-Capillary: 147 mg/dL — ABNORMAL HIGH (ref 70–99)

## 2020-07-24 LAB — COMPREHENSIVE METABOLIC PANEL
ALT: 35 U/L (ref 0–44)
AST: 20 U/L (ref 15–41)
Albumin: 2.2 g/dL — ABNORMAL LOW (ref 3.5–5.0)
Alkaline Phosphatase: 105 U/L (ref 38–126)
Anion gap: 9 (ref 5–15)
BUN: 30 mg/dL — ABNORMAL HIGH (ref 8–23)
CO2: 35 mmol/L — ABNORMAL HIGH (ref 22–32)
Calcium: 8.4 mg/dL — ABNORMAL LOW (ref 8.9–10.3)
Chloride: 92 mmol/L — ABNORMAL LOW (ref 98–111)
Creatinine, Ser: 1.48 mg/dL — ABNORMAL HIGH (ref 0.61–1.24)
GFR, Estimated: 50 mL/min — ABNORMAL LOW (ref >60)
Glucose, Bld: 158 mg/dL — ABNORMAL HIGH (ref 70–99)
Potassium: 5 mmol/L (ref 3.5–5.1)
Sodium: 136 mmol/L (ref 135–145)
Total Bilirubin: 0.5 mg/dL (ref 0.3–1.2)
Total Protein: 5.5 g/dL — ABNORMAL LOW (ref 6.5–8.1)

## 2020-07-24 LAB — HEPARIN LEVEL (UNFRACTIONATED): Heparin Unfractionated: >2.2 IU/mL — ABNORMAL HIGH (ref 0.30–0.70)

## 2020-07-24 LAB — APTT: aPTT: 95 seconds — ABNORMAL HIGH (ref 24–36)

## 2020-07-24 LAB — LACTIC ACID, PLASMA: Lactic Acid, Venous: 0.7 mmol/L (ref 0.5–1.9)

## 2020-07-24 MED ORDER — COLLAGENASE 250 UNIT/GM EX OINT
TOPICAL_OINTMENT | Freq: Every day | CUTANEOUS | Status: DC
  Filled 2020-07-24: qty 30

## 2020-07-24 MED ORDER — SODIUM CHLORIDE 0.9 % IV BOLUS
500.0000 mL | Freq: Once | INTRAVENOUS | Status: AC
  Administered 2020-07-24: 10:00:00 500 mL via INTRAVENOUS

## 2020-07-24 MED ORDER — SODIUM CHLORIDE 0.9 % IV BOLUS
500.0000 mL | Freq: Once | INTRAVENOUS | Status: AC
  Administered 2020-07-24: 05:00:00 500 mL via INTRAVENOUS

## 2020-07-24 MED ORDER — ENSURE ENLIVE PO LIQD
237.0000 mL | Freq: Three times a day (TID) | ORAL | Status: DC
  Administered 2020-07-24 – 2020-07-29 (×6): 237 mL via ORAL

## 2020-07-24 MED ORDER — HYDROCODONE-ACETAMINOPHEN 5-325 MG PO TABS: 1.0000 | ORAL_TABLET | Freq: Four times a day (QID) | ORAL | Status: DC | PRN

## 2020-07-24 MED ORDER — PROSOURCE PLUS PO LIQD
30.0000 mL | Freq: Two times a day (BID) | ORAL | Status: DC
  Administered 2020-07-25 – 2020-07-31 (×8): 30 mL via ORAL
  Filled 2020-07-24 (×8): qty 30

## 2020-07-24 NOTE — Progress Notes (Signed)
Pt transferred to air mattress bed. Pt tolerated well. Will continue to monitor.  Hazle Nordmann, RN

## 2020-07-24 NOTE — Plan of Care (Signed)
  Problem: Clinical Measurements: Goal: Ability to maintain clinical measurements within normal limits will improve Outcome: Progressing   Problem: Clinical Measurements: Goal: Diagnostic test results will improve Outcome: Progressing   Problem: Nutrition: Goal: Adequate nutrition will be maintained Outcome: Progressing   Problem: Elimination: Goal: Will not experience complications related to urinary retention Outcome: Progressing   Problem: Pain Managment: Goal: General experience of comfort will improve Outcome: Progressing   Problem: Skin Integrity: Goal: Risk for impaired skin integrity will decrease Outcome: Progressing   

## 2020-07-24 NOTE — Consult Note (Addendum)
WOC Nurse Consult Note: Patient receiving care in [facility] 2C09 Reason for Consult: Sacral ulcer Wound type: Stage 4 coccyx wound Pressure Injury POA: Yes Measurement: 2 cm x 2 cm x 1 cm  Wound bed: 90% yellow slough with 10% red/pink granulation tissue, undermining from 3 o'clock to 6 o'clock Drainage (amount, consistency, odor)  Serosanguinous with some green drainage on dressing.  Periwound: Intact Dressing procedure/placement/frequency: Apply Santyl to coccyx wound in a nickel thick layer. Cover with a saline moistened gauze, then dry gauze or ABD pad.  Change daily. Orders placed for mattress replacement.   Monitor the wound area(s) for worsening of condition such as: Signs/symptoms of infection, increase in size, development of or worsening of odor, development of pain, or increased pain at the affected locations.   Notify the medical team if any of these develop.  Thank you for the consult. WOC nurse will not follow at this time.   Please re-consult the WOC team if needed.  Renaldo Reel Katrinka Blazing, MSN, RN, CMSRN, Angus Seller, [facility] Wound Treatment Associate Pager [phone]

## 2020-07-24 NOTE — Progress Notes (Signed)
ANTICOAGULATION CONSULT NOTE  Pharmacy Consult for heparin Indication: recent h/o PE/DVT 04/2020  Labs: Recent Labs    07/22/20 1628 07/22/20 1707 07/22/20 1710 07/22/20 1839 07/23/20 0104 07/23/20 0847 07/23/20 2100 07/24/20 0255  HGB 10.9* 12.6*  --   --  10.5*  --   --  9.7*  HCT 38.1* 37.0*  --   --  35.3*  --   --  34.3*  PLT 401*  --   --   --  395  --   --  338  APTT  --   --   --   --   --  55* 83* 95*  HEPARINUNFRC  --   --   --   --   --  >2.20*  --  >2.20*  CREATININE 1.41*  --  1.40*  --  1.37*  --   --  1.48*  TROPONINIHS 13  --   --  12  --   --   --   --     Assessment: 74yo male presents from NH after period of apnea and unresponsiveness, admitted for acute respiratory failure and sepsis, to transition from Eliquis to heparin for PE/DVT (Oct 2021) while unable to take PO meds; last dose of Eliquis taken 12/25 9a.  Heparin level falsely elevated given recent DOAC dose, aPTT is therapeutic at 95, on 1350 units/hr. Hgb 9.7, plt 338. No s/sx of bleeding or infusion issues.   Goal of Therapy:  Heparin level 0.3-0.7 units/ml aPTT 66-102 seconds Monitor platelets by anticoagulation protocol: Yes   Plan:  Continue heparin at 1350 units/hr F/u aPTT until correlates with heparin level  Monitor daily aPTT, HL, CBC/plt Monitor for signs/symptoms of bleeding  F/u restart apixaban  Thanks for allowing pharmacy to be a part of this patient's care.  Sherron Monday, PharmD, BCCCP Clinical Pharmacist  Phone: [phone] 07/24/2020 10:48 AM  Please check AMION for all [facility] Pharmacy phone numbers After 10:00 PM, call Main Pharmacy [phone]

## 2020-07-24 NOTE — Progress Notes (Signed)
PROGRESS NOTE  Jay Holloway DTO:[id number] DOB: [birth year] DOA: 07/22/2020 PCP: [name], MD  HPI/Recap of past 44 hours: 74 year old male with past medical history of hypertension, diabetes mellitus, diastolic CHF, DVT/PE on Eliquis and stage IIIa chronic kidney disease presented from a skilled nursing facility after an episode of unresponsiveness.  There was a possible cardiac arrest at his nursing facility as they were unable to palpate a pulse and patient went 5 minutes CPR but upon arrival of EMS, patient was awake and with pulses.  Looks to be somewhat disoriented.  Oxygen saturations are 83%.  Brought into the emergency room and found to have sepsis secondary to pneumonia aspiration pneumonia with acute respiratory failure.  Admitted to the hospitalist service.  He has had previous admissions for dysphagia and is at high risk for recurrent aspiration.  Seen by speech therapy and placed on dysphagia 3 although this will not be complete.  Seen by palliative care and discussion with patient and his nephew done.  He seemed to misunderstand and was upset about his DNR bracelet and requested himself to be a full code.  Follow-up discussion planned for tomorrow.  In the interim, IV access lost and new PICC line placed on 12/26.  Since 12/26 evening, blood pressures have been slightly soft requiring some fluid boluses.  Patient received Percocet this morning and seems slightly groggy.  By late morning, this improved.  Palliative care met with patient and his nephew today.  After extensive discussion, he was switched to a partial code.  They understand of how frail he is and frequent hospitalizations and will consider hospice.  Assessment/Plan: Principal Problem: Severe sepsis ([place]) present on admission from aspiration pneumonia from dysphagia causing acute respiratory failure with hypoxia and hypercapnia: Patient meets criteria given respiratory failure, pneumonia source and SIRS criteria.   Improving, still on 3L nasal cannula.  White blood cell count much improved today.  His long-term aspiration risks remain high.  Palliative care following and further goals of care will be decided tomorrow.  Currently DNR. Active Problems:   Essential hypertension: Blood pressures remain soft requiring fluid boluses   GERD without esophagitis    HCAP (healthcare-associated pneumonia)   Chronic diastolic CHF (congestive heart failure) (HCC) mild elevation of BNP.  However nothing seen on chest x-ray or CT in form of fluid.  Suspect that this mild elevation is due more to his renal disease and respiratory failure issues rather than volume overload.  Especially in the context of sepsis would favor the fluid resuscitation.  We will plan to diurese once his blood pressure is much more stable.  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  CBGs mildly elevated in the context of his acute illness.  A1c done 2-1/2 months ago elevated at 7.8.  Improved somewhat today.  Stage IV decubital ulcer: Present on admission.  Appreciate wound care help.  Receiving Santyl dressing plus saline moistened gauze then dry gauze or abdominal pad and changing daily.  Air mattress.    CKD (chronic kidney disease), stage IIIa: Looks to be at baseline.    Severe protein-calorie malnutrition ([place]): Nutrition to see.   Code Status: Partial code  Family Communication: Left message with nephew  Disposition Plan: Looking at skilled nursing versus assisted living versus hospice from here.  With acute issues of his hypotension, treating infection and then will need diuresis.  If he chooses hospice, we can make  him comfort care in the interim   Consultants:  Palliative care  Procedures:  PICC line placement 12/26  Antimicrobials:  IV vancomycin 12/25-12/26  IV Zosyn 12/25-present  DVT prophylaxis: Heparin  infusion   Objective: Vitals:   07/24/20 0800 07/24/20 0907  BP: (!) 94/50 (!) 87/48  Pulse: (!) 105 (!) 107  Resp: 16 14  Temp: 98.4 F (36.9 C)   SpO2: 98% 97%    Intake/Output Summary (Last 24 hours) at 07/24/2020 0947 Last data filed at 07/24/2020 2440 Gross per 24 hour  Intake 1252.74 ml  Output --  Net 1252.74 ml   Filed Weights   07/22/20 2300 07/22/20 2321  Weight: 90.7 kg 98.4 kg   Body mass index is 24.44 kg/m.  Exam:   General: Seen earlier this morning fatigued, disoriented, mumbling  HEENT: Normocephalic, atraumatic, mucous membranes are dry, poor dentition  Cardiovascular: Regular rate and rhythm, S1-S2, borderline tachycardia  Respiratory: Scattered rails for airway exchange  Abdomen: Soft, nontender, nondistended, hypoactive bowel sounds  Musculoskeletal: No clubbing or cyanosis, trace pitting edema  Psychiatry: Acutely confused   Data Reviewed: CBC: Recent Labs  Lab 07/22/20 1628 07/22/20 1707 07/23/20 0104 07/24/20 0255  WBC 11.1*  --  16.5* 9.5  NEUTROABS 6.5  --   --   --   HGB 10.9* 12.6* 10.5* 9.7*  HCT 38.1* 37.0* 35.3* 34.3*  MCV 94.5  --  94.6 95.8  PLT 401*  --  395 338   Basic Metabolic Panel: Recent Labs  Lab 07/22/20 1628 07/22/20 1707 07/22/20 1710 07/23/20 0104 07/24/20 0255  NA 138 136  --  138 136  K 4.8 4.6  --  4.8 5.0  CL 97*  --   --  95* 92*  CO2 32  --   --  33* 35*  GLUCOSE 200*  --   --  211* 158*  BUN 30*  --   --  29* 30*  CREATININE 1.41*  --  1.40* 1.37* 1.48*  CALCIUM 8.5*  --   --  8.5* 8.4*  MG  --   --   --  1.8  --    GFR: Estimated Creatinine Clearance: 58.9 mL/min (A) (by C-G formula based on SCr of 1.48 mg/dL (H)). Liver Function Tests: Recent Labs  Lab 07/22/20 1628 07/24/20 0255  AST 36 20  ALT 45* 35  ALKPHOS 116 105  BILITOT 0.5 0.5  PROT 5.7* 5.5*  ALBUMIN 2.4* 2.2*   No results for input(s): LIPASE, AMYLASE in the last 168 hours. No results for input(s): AMMONIA in  the last 168 hours. Coagulation Profile: No results for input(s): INR, PROTIME in the last 168 hours. Cardiac Enzymes: No results for input(s): CKTOTAL, CKMB, CKMBINDEX, TROPONINI in the last 168 hours. BNP (last 3 results) No results for input(s): PROBNP in the last 8760 hours. HbA1C: No results for input(s): HGBA1C in the last 72 hours. CBG: Recent Labs  Lab 07/23/20 0830 07/23/20 1139 07/23/20 1651 07/23/20 2126 07/24/20 0622  GLUCAP 214* 176* 208* 151* 148*   Lipid Profile: No results for input(s): CHOL, HDL, LDLCALC, TRIG, CHOLHDL, LDLDIRECT in the last 72 hours. Thyroid Function Tests: No results for input(s): TSH, T4TOTAL, FREET4, T3FREE, THYROIDAB in the last 72 hours. Anemia Panel: No results for input(s): VITAMINB12, FOLATE, FERRITIN, TIBC, IRON, RETICCTPCT in the last 72 hours. Urine analysis:    Component Value Date/Time   COLORURINE AMBER (A) 04/28/2020 2016   APPEARANCEUR CLOUDY (A) 04/28/2020 2016   LABSPEC 1.020  04/28/2020 2016   PHURINE 5.0 04/28/2020 2016   GLUCOSEU NEGATIVE 04/28/2020 2016   HGBUR NEGATIVE 04/28/2020 2016   [place] NEGATIVE 04/28/2020 2016   [place] 04/28/2020 2016   PROTEINUR 100 (A) 04/28/2020 2016   UROBILINOGEN 0.2 11/05/2014 1712   NITRITE NEGATIVE 04/28/2020 2016   LEUKOCYTESUR LARGE (A) 04/28/2020 2016   Sepsis Labs: $[id number]'[id number]$ (procalcitonin:4,lacticidven:4)  ) Recent Results (from the past 240 hour(s))  Resp Panel by RT-PCR (Flu A&B, Covid) Nasopharyngeal Swab     Status: None   Collection Time: 07/22/20  4:37 PM   Specimen: Nasopharyngeal Swab; Nasopharyngeal(NP) swabs in vial transport medium  Result Value Ref Range Status   SARS Coronavirus 2 by RT PCR NEGATIVE NEGATIVE Final    Comment: (NOTE) SARS-CoV-2 target nucleic acids are NOT DETECTED.  The SARS-CoV-2 RNA is generally detectable in upper respiratory specimens during the acute phase of infection. The lowest concentration of SARS-CoV-2 viral  copies this assay can detect is 138 copies/mL. A negative result does not preclude SARS-Cov-2 infection and should not be used as the sole basis for treatment or other patient management decisions. A negative result may occur with  improper specimen collection/handling, submission of specimen other than nasopharyngeal swab, presence of viral mutation(s) within the areas targeted by this assay, and inadequate number of viral copies(<138 copies/mL). A negative result must be combined with clinical observations, patient history, and epidemiological information. The expected result is Negative.  Fact Sheet for Patients:  EntrepreneurPulse.com.au  Fact Sheet for Healthcare Providers:  IncredibleEmployment.be  This test is no t yet approved or cleared by the [place] FDA and  has been authorized for detection and/or diagnosis of SARS-CoV-2 by FDA under an Emergency Use Authorization (EUA). This EUA will remain  in effect (meaning this test can be used) for the duration of the COVID-19 declaration under Section 564(b)(1) of the Act, 21 U.S.C.section 360bbb-3(b)(1), unless the authorization is terminated  or revoked sooner.       Influenza A by PCR NEGATIVE NEGATIVE Final   Influenza B by PCR NEGATIVE NEGATIVE Final    Comment: (NOTE) The Xpert Xpress SARS-CoV-2/FLU/RSV plus assay is intended as an aid in the diagnosis of influenza from Nasopharyngeal swab specimens and should not be used as a sole basis for treatment. Nasal washings and aspirates are unacceptable for Xpert Xpress SARS-CoV-2/FLU/RSV testing.  Fact Sheet for Patients: EntrepreneurPulse.com.au  Fact Sheet for Healthcare Providers: IncredibleEmployment.be  This test is not yet approved or cleared by the [place] FDA and has been authorized for detection and/or diagnosis of SARS-CoV-2 by FDA under an Emergency Use Authorization (EUA). This  EUA will remain in effect (meaning this test can be used) for the duration of the COVID-19 declaration under Section 564(b)(1) of the Act, 21 U.S.C. section 360bbb-3(b)(1), unless the authorization is terminated or revoked.  Performed at [facility], [place] [address], [place],  [address]   Culture, blood (Routine X 2) w Reflex to ID Panel     Status: None (Preliminary result)   Collection Time: 07/22/20  9:20 PM   Specimen: BLOOD LEFT HAND  Result Value Ref Range Status   Specimen Description BLOOD LEFT HAND  Final   Special Requests   Final    BOTTLES DRAWN AEROBIC AND ANAEROBIC Blood Culture adequate volume   Culture   Final    NO GROWTH 2 DAYS Performed at [facility], [place] [address] [place],  [address]    Report Status PENDING  Incomplete  Culture, blood (Routine X 2) w Reflex to ID Panel     Status: None (Preliminary result)   Collection Time: 07/22/20  9:20 PM   Specimen: BLOOD RIGHT HAND  Result Value Ref Range Status   Specimen Description BLOOD RIGHT HAND  Final   Special Requests   Final    BOTTLES DRAWN AEROBIC AND ANAEROBIC Blood Culture adequate volume   Culture   Final    NO GROWTH 2 DAYS Performed at [facility], [place] [address], [place], [place] [address]    Report Status PENDING  Incomplete  MRSA PCR Screening     Status: None   Collection Time: 07/23/20 12:00 AM   Specimen: Nasopharyngeal  Result Value Ref Range Status   MRSA by PCR NEGATIVE NEGATIVE Final    Comment:        The GeneXpert MRSA Assay (FDA approved for NASAL specimens only), is one component of a comprehensive MRSA colonization surveillance program. It is not intended to diagnose MRSA infection nor to guide or monitor treatment for MRSA infections. Performed at [facility], [place] [address], [place], [place] [address]       Studies: [place] EKG SITE RITE  Result Date: 07/23/2020 If Site Rite image not attached, placement could not be confirmed  due to current cardiac rhythm.   Scheduled Meds: . atorvastatin  10 mg Oral Daily  . Chlorhexidine Gluconate Cloth  6 each Topical Daily  . cholecalciferol  1,000 Units Oral Daily  . collagenase   Topical Daily  . feeding supplement  237 mL Oral BID BM  . gabapentin  100 mg Oral TID  . insulin aspart  0-5 Units Subcutaneous QHS  . insulin aspart  0-9 Units Subcutaneous TID WC  . insulin glargine  7 Units Subcutaneous Daily  . ipratropium-albuterol  3 mL Nebulization BID  . magnesium oxide  400 mg Oral Daily  . multivitamin with minerals  1 tablet Oral Daily  . pantoprazole  40 mg Oral BID  . sodium chloride flush  10-40 mL Intracatheter Q12H    Continuous Infusions: . sodium chloride Stopped (07/24/20 0259)  . heparin 1,350 Units/hr (07/24/20 6147)  . piperacillin-tazobactam (ZOSYN)  IV 3.375 g (07/24/20 0936)     LOS: 2 days     Annita Brod, MD [facility]   07/24/2020, 9:47 AM

## 2020-07-24 NOTE — TOC Initial Note (Signed)
Transition of Care [facility]) - Initial/Assessment Note    Patient Details  Name: Jay Holloway MRN: [id number] Date of Birth: [birth year]  Transition of Care [facility]) CM/SW Contact:    Bethann Berkshire, [place] Phone Number: 07/24/2020, 12:54 PM  Clinical Narrative:                 CSW met with pt, [name], and [name]. Pt is oreinted x2 and mostly quiet. CSW discusses with nephew and friend disposition and answers LTC questions related to insurance coverage. Pt has come from [facility] and was in copay days. He's had multiple insurance denials and is currently waiting on an appeal for a SNF. Pt nephew reports that pt has been adamant in the past about not spending down to qualify for medicaid. CSW to contact [facility] and follow up regarding return and insurance auth.   Expected Discharge Plan: Skilled Nursing Facility Barriers to Discharge: Continued Medical Work up   Patient Goals and CMS Choice Patient states their goals for this hospitalization and ongoing recovery are:: Return to [employer]      Expected Discharge Plan and Services Expected Discharge Plan: [place]       Living arrangements for the past 2 months: [place]                                      Prior Living Arrangements/Services Living arrangements for the past 2 months: [place] Lives with:: Facility Resident          Need for Family Participation in Patient Care: Yes (Comment) Care giver support system in place?: No (comment)      Activities of Daily Living Home Assistive Devices/Equipment: Environmental consultant (specify type),Wheelchair,Oxygen ADL Screening (condition at time of admission) Patient's cognitive ability adequate to safely complete daily activities?: No Is the patient deaf or have difficulty hearing?: No Does the patient have difficulty seeing, even when wearing glasses/contacts?: No Patient able to express  need for assistance with ADLs?: No Does the patient have difficulty dressing or bathing?: Yes Independently performs ADLs?: No Communication: Appropriate for developmental age Dressing (OT): Dependent Is this a change from baseline?: Pre-admission baseline Grooming: Dependent Is this a change from baseline?: Pre-admission baseline Feeding: Appropriate for developmental age Bathing: Dependent Is this a change from baseline?: Pre-admission baseline Toileting: Dependent Is this a change from baseline?: Pre-admission baseline In/Out Bed: Dependent Is this a change from baseline?: Pre-admission baseline Walks in Home: Dependent Is this a change from baseline?: Pre-admission baseline Does the patient have difficulty walking or climbing stairs?: Yes Weakness of Legs: Both Weakness of Arms/Hands: None  Permission Sought/Granted                  Emotional Assessment Appearance:: Appears stated age Attitude/Demeanor/Rapport: Lethargic Affect (typically observed): Quiet Orientation: : Oriented to Self,Oriented to Place Alcohol / Substance Use: Not Applicable Psych Involvement: No (comment)  Admission diagnosis:  HCAP (healthcare-associated pneumonia) [J18.9] Aspiration pneumonia of right lung due to gastric secretions, unspecified part of lung ([place]) [J69.0] Patient Active Problem List   Diagnosis Date Noted  . Severe protein-calorie malnutrition ([place]) 07/23/2020  . HCAP (healthcare-associated pneumonia) 07/22/2020  . Chronic diastolic CHF (congestive heart failure) ([place]) 07/22/2020  . Type II diabetes mellitus with renal manifestations ([place]) 07/22/2020  . Sepsis ([place]) 07/22/2020  . Decubital ulcer 07/22/2020  . Unresponsiveness 07/22/2020  .  HLD (hyperlipidemia) 07/22/2020  . CKD (chronic kidney disease), stage IIIa 07/22/2020  . Aspiration pneumonia ([place]) 07/22/2020  . PE (pulmonary thromboembolism) ([place]) 07/22/2020  . VTE (venous thromboembolism) 07/03/2020  . At risk for  obstructive sleep apnea 07/03/2020  . Former smoker 07/03/2020  . Shortness of breath 07/03/2020  . Acute respiratory failure with hypoxia and hypercapnia ([place]) 05/10/2020  . Acute respiratory failure ([place]) 05/10/2020  . Hypomagnesemia 04/28/2020  . Mixed diabetic hyperlipidemia associated with type 2 diabetes mellitus ([place]) 04/28/2020  . Pressure injury of sacral region, stage 2 ([place]) 04/19/2020  . Acute diastolic CHF (congestive heart failure) ([place]) 04/19/2020  . Acute respiratory failure with hypoxia and hypercarbia ([place]) 04/17/2020  . Hypernatremia 04/17/2020  . Dysphagia 04/17/2020  . Acute metabolic encephalopathy 35/70/1779  . Hyperkalemia 04/14/2020  . GERD without esophagitis 04/14/2020  . Slurred speech 04/14/2020  . Generalized weakness 04/14/2020  . AKI (acute kidney injury) ([place]) 03/21/2020  . Prostate cancer ([place])   . Essential hypertension   . Arterial hypotension   . Renal failure   . Type 2 diabetes mellitus with hyperglycemia, with long-term current use of insulin ([place])   . Weakness   . DKA (diabetic ketoacidoses) 11/05/2014   PCP:  [name], MD Pharmacy:   [facility] [place], [address] [place] [address] Phone: [phone] Fax: [phone]     Social Determinants of Health (SDOH) Interventions    Readmission Risk Interventions No flowsheet data found.

## 2020-07-24 NOTE — Progress Notes (Signed)
Daily Progress Note   Patient Name: Jay Holloway       Date: 07/24/2020 DOB: 11-14-1945  Age: 74 y.o. MRN#: [id number] Attending Physician: Annita Brod, MD Primary Care Physician: [name], MD Admit Date: 07/22/2020  Reason for Consultation/Follow-up: To discuss complex medical decision making related to patient's goals of care  Subjective:  Met with patient's nephew Dorathy Daft and his significant other Holley Bouche.  Discussed the patients current issues of recurrent aspiration pneumonia, recurrent hypercarbia and dementia.  These are unfortunately conditions that are eventually terminal.  Per Nephew - patient has begun to say "I'm tired.  Just let me go" - he has never heard him talk that way before.  Ms. Freda Munro states that every time he seems to be getting back on his feet he has a set back.  Two-three months ago he was living at home independently.  He can no longer do that.  Talked about disposition options - (1) return to SNF? (2) Assisted living with Hospice? (3) Assisted living with home health.   My recommendation was for ALF with Hospice or Long term care with Hospice.  We discussed the natural trajectory of dementia - increasing aspiration and lack of desire to eat and drink.  Mr. Rudin is following a very natural path towards end of life.  [name] explains that his Uncle still speaks of wanted to live in his own home and buying a new pick up truck.  I advised the family to let Mr. Amacher keep his hopes and wishes whether or not they could happen.  We reviewed a MOST form together.  [name] and Ms. Freda Munro jointly chose to attempt resuscitation but no intubation.   Return to the hospital with limited interventions.  Yes to antibiotics and fluids for now.  No to PEG  tube.  The family understands that the MOST form is to be readdressed and updated as Mr. Leeb's condition changes.  Family expressed concern about patient's home CPAP or BiPAP.  Patient indicated that it did not fit.  He does not want the full face mask.  He refuses to wear it.  Family realizes patient will suffer with hypercarbia if he does not wear the CPAP at night.   We also re-confirmed that despite the risk of aspiration, family does not want the  patient to be denied food or drink.  It is an important part of his quality of life.    Assessment: Patient lethargic and weak appearing.  Aspirates frequently when he eats.  Family understanding and supportive.   Patient Profile/HPI:  74 y.o. male  with past medical history of prostate CA, DM, d-CHF with EF of 60-65%, CKD3, PE in 04/2020 now on Eliquis, and recurrent aspiration, who was admitted on 07/22/2020 with concern for out of hospital arrest.  He received 5 minutes of CPR at SNF.  On admission to the ER he was found to be in hypoxic and hypercarbic respiratory failure with aspiration pneumonia vs HCAP. He is more alert today and asking "how long have I been out?".  He is lethargic and somewhat disorientated.  He repeatedly asks me for water and to "call Holley Bouche".  SLP worked with him this morning.  He had signs of aspiration across all solid and liquid consistencies.  Diet was recommended as family desires for him to be fed despite the risk of aspiration.  Length of Stay: 2   Vital Signs: BP 93/60 (BP Location: Left Arm)   Pulse (!) 108   Temp (!) 97.5 F (36.4 C) (Oral)   Resp 17   Ht $R'6\' 7"'om$  (2.007 m)   Wt 98.4 kg   SpO2 94%   BMI 24.44 kg/m  SpO2: SpO2: 94 % O2 Device: O2 Device: Nasal Cannula O2 Flow Rate: O2 Flow Rate (L/min): 3 L/min       Palliative Assessment/Data: 30-40%     Palliative Care Plan    Recommendations/Plan: Code status changed to DNI No long term life support of any kind.  No PEG. MOST  form completed:  Attempt resuscitation (but DNI), Limited interventions, Yes antibiotics, Yes IV fluids, No feeding tube. Discussed with TOC.  He will come meet with family while they are visiting today.   Code Status:  Limited code - DNI  Prognosis:  < 6 months.  Rapid functional decline was walking 3 months ago.  Dementia, significant weight loss, poor PO intake, recurrent aspiration pneumonia, recurrent hypercarbia, 5 hospitalizations in 6 months.   Discharge Planning: To Be Determined considering SNF vs Assisted living with Home Health and Palliative VS Assisted Living with Hospice  Care plan was discussed with family.  Asked TOC to visit with family as well to discuss insurance and placement.  Thank you for allowing the Palliative Medicine Team to assist in the care of this patient.  Total time spent:  78 minutes     Greater than 50%  of this time was spent counseling and coordinating care related to the above assessment and plan.  Florentina Jenny, PA-C Palliative Medicine  Please contact Palliative MedicineTeam phone at [phone] for questions and concerns between 7 am - 7 pm.   Please see AMION for individual provider pager numbers.     Marland Kitchen

## 2020-07-24 NOTE — Progress Notes (Signed)
Initial Nutrition Assessment  DOCUMENTATION CODES:   Severe malnutrition in context of chronic illness  INTERVENTION:   - Ensure Enlive po TID, each supplement provides 350 kcal and 20 grams of protein  - Magic Cup TID with meals, each supplement provides 290 kcal and 9 grams of protein  - ProSource Plus 30 ml po BID, each supplement provides 100 kcal and 15 grams of protein  - Continue MVI with minerals daily  - Feeding assistance with meals  NUTRITION DIAGNOSIS:   Severe Malnutrition related to chronic illness (CHF, dysphagia, dementia) as evidenced by severe muscle depletion,severe fat depletion.  GOAL:   Patient will meet greater than or equal to 90% of their needs  MONITOR:   PO intake,Supplement acceptance,Labs,Weight trends,Skin,I & O's  REASON FOR ASSESSMENT:   Consult Assessment of nutrition requirement/status  ASSESSMENT:   74 year old male who presented to the ED on 12/25 after being found unresponsive at SNF. PMH of HTN, HLD, T2DM, GERD, dementia, prostate cancer, CHF, CKD stage IIIa. Pt admitted with sepsis due to aspiration pneumonia vs HCAP.   Per SLP note, pt is symptomatic of aspiration across consistencies but pt's family requests for pt to have PO diet so dysphagia 3 diet with thin liquids ordered on 12/26.  Reviewed Palliative Medicine note. Pt is now a partial code. Pt would not want a feeding tube. Discharge plan is being determined and discussed with family at this time. Disposition includes SNF vs ALF vs home with hospice.  Spoke with pt's nephew at bedside. He states that pt's PO intake has been significantly decreased over the last 1.5 months. He reports that pt "nibbles" on and off throughout the day but does not eat full meals.  Pt used to weigh about 300 lbs and last weighed this in January 2020. Per pt's nephew, pt has lost over 100 lbs in the last 2 years due to being sick and poor PO intake. Current weight is 216 lbs. However, pt with deep  pitting edema to BLE. Weight of 90.7 kg recorded from encounter on 07/03/20 Suspect this is closer to pt's dry weight.  NT at bedside feeding pt from lunch tray at time of visit. Pt's nephew and pt states that pt would like Magic Cup ice cream supplements. Pt occasionally will drink Ensure supplements.  Will increase Ensure from BID to TID and order Magic Cups TID with meals. Will continue to follow for [place] discussions.  Medications reviewed and include: cholecalciferol, Ensure Enlive BID, SSI, lantus 7 units daily, magnesium oxide, MVI with minerals, protonix, heparin, IV abx  Labs reviewed: BUN 30, creatinine 1.48, hemoglobin 9.7 CBG's: 148-208 x 24 hours  UOP: 500 ml x 24 hours I/O's: +752 ml since admit  NUTRITION - FOCUSED PHYSICAL EXAM:  Flowsheet Row Most Recent Value  Orbital Region Severe depletion  Upper Arm Region Severe depletion  Thoracic and Lumbar Region Moderate depletion  Buccal Region Severe depletion  Temple Region Severe depletion  Clavicle Bone Region Severe depletion  Clavicle and Acromion Bone Region Severe depletion  Scapular Bone Region Unable to assess  Dorsal Hand Moderate depletion  Patellar Region Unable to assess  [difficult to assess d/t edema]  Anterior Thigh Region Unable to assess  [difficult to assess d/t edema]  Posterior Calf Region Unable to assess  [difficult to assess d/t edema]  Edema (RD Assessment) Severe  [BLE]  Hair Reviewed  Eyes Reviewed  Mouth Reviewed  [several missing teeth]  Skin Reviewed  Nails Reviewed  Diet Order:   Diet Order            DIET DYS 3 Room service appropriate? Yes with Assist; Fluid consistency: Thin  Diet effective now                 EDUCATION NEEDS:   Education needs have been addressed  Skin:  Skin Assessment: Skin Integrity Issues: Stage IV: coccyx  Last BM:  07/23/20  Height:   Ht Readings from Last 1 Encounters:  07/22/20 6\' 7"  (2.007 m)    Weight:   Wt Readings from Last  1 Encounters:  07/22/20 98.4 kg    BMI:  Body mass index is 24.44 kg/m.  Estimated Nutritional Needs:   Kcal:  2000-2200  Protein:  100-120 grams  Fluid:  1.8 L/day    Gustavus Bryant, MS, RD, LDN Inpatient Clinical Dietitian Please see AMiON for contact information.

## 2020-07-25 DIAGNOSIS — I5033 Acute on chronic diastolic (congestive) heart failure: Secondary | ICD-10-CM | POA: Diagnosis not present

## 2020-07-25 DIAGNOSIS — A419 Sepsis, unspecified organism: Secondary | ICD-10-CM | POA: Diagnosis not present

## 2020-07-25 DIAGNOSIS — J690 Pneumonitis due to inhalation of food and vomit: Secondary | ICD-10-CM | POA: Diagnosis not present

## 2020-07-25 DIAGNOSIS — J9601 Acute respiratory failure with hypoxia: Secondary | ICD-10-CM | POA: Diagnosis not present

## 2020-07-25 DIAGNOSIS — R6520 Severe sepsis without septic shock: Secondary | ICD-10-CM

## 2020-07-25 DIAGNOSIS — N1832 Chronic kidney disease, stage 3b: Secondary | ICD-10-CM | POA: Diagnosis not present

## 2020-07-25 DIAGNOSIS — Z515 Encounter for palliative care: Secondary | ICD-10-CM | POA: Diagnosis not present

## 2020-07-25 LAB — BASIC METABOLIC PANEL
Anion gap: 8 (ref 5–15)
BUN: 34 mg/dL — ABNORMAL HIGH (ref 8–23)
CO2: 35 mmol/L — ABNORMAL HIGH (ref 22–32)
Calcium: 8.3 mg/dL — ABNORMAL LOW (ref 8.9–10.3)
Chloride: 95 mmol/L — ABNORMAL LOW (ref 98–111)
Creatinine, Ser: 1.87 mg/dL — ABNORMAL HIGH (ref 0.61–1.24)
GFR, Estimated: 37 mL/min — ABNORMAL LOW (ref >60)
Glucose, Bld: 229 mg/dL — ABNORMAL HIGH (ref 70–99)
Potassium: 5.1 mmol/L (ref 3.5–5.1)
Sodium: 138 mmol/L (ref 135–145)

## 2020-07-25 LAB — CBC
HCT: 30.8 % — ABNORMAL LOW (ref 39.0–52.0)
Hemoglobin: 9.1 g/dL — ABNORMAL LOW (ref 13.0–17.0)
MCH: 27.2 pg (ref 26.0–34.0)
MCHC: 29.5 g/dL — ABNORMAL LOW (ref 30.0–36.0)
MCV: 92.2 fL (ref 80.0–100.0)
Platelets: 309 10*3/uL (ref 150–400)
RBC: 3.34 MIL/uL — ABNORMAL LOW (ref 4.22–5.81)
RDW: 15.9 % — ABNORMAL HIGH (ref 11.5–15.5)
WBC: 8.7 10*3/uL (ref 4.0–10.5)
nRBC: 0 % (ref 0.0–0.2)

## 2020-07-25 LAB — BRAIN NATRIURETIC PEPTIDE: B Natriuretic Peptide: 354.4 pg/mL — ABNORMAL HIGH (ref 0.0–100.0)

## 2020-07-25 LAB — GLUCOSE, CAPILLARY
Glucose-Capillary: 183 mg/dL — ABNORMAL HIGH (ref 70–99)
Glucose-Capillary: 130 mg/dL — ABNORMAL HIGH (ref 70–99)
Glucose-Capillary: 181 mg/dL — ABNORMAL HIGH (ref 70–99)
Glucose-Capillary: 144 mg/dL — ABNORMAL HIGH (ref 70–99)

## 2020-07-25 LAB — APTT: aPTT: 77 seconds — ABNORMAL HIGH (ref 24–36)

## 2020-07-25 LAB — HEPARIN LEVEL (UNFRACTIONATED): Heparin Unfractionated: >2.2 IU/mL — ABNORMAL HIGH (ref 0.30–0.70)

## 2020-07-25 MED ORDER — FUROSEMIDE 10 MG/ML IJ SOLN
20.0000 mg | Freq: Two times a day (BID) | INTRAMUSCULAR | Status: DC
  Administered 2020-07-25 – 2020-07-30 (×10): 20 mg via INTRAVENOUS
  Filled 2020-07-25 (×10): qty 2

## 2020-07-25 NOTE — Progress Notes (Signed)
  Speech Language Pathology Treatment: Dysphagia  Patient Details Name: Jay Holloway MRN: [id number] DOB: 1946-01-11 Today's Date: 07/25/2020 Time: 5364-6803 SLP Time Calculation (min) (ACUTE ONLY): 14 min  Assessment / Plan / Recommendation Clinical Impression  Note update on [place] after palliative care meeting that includes desire to accept aspiration risk. Pt does not want PEG. Diet has therefore been maintained as Dys 3 consistencies and thin liquids. SLP provided skilled observation and assistance during part of lunch meal, with coughing observed regardless of consistency or precautions implemented. Pt reiterated that it was very important for him to be able to eat. Discussed pt and POC with RN. Given that pt/family continue to desire this diet, accepting known risk of aspiration, further SLP f/u is likely not needed acutely. Would continue aspiration and esophageal precautions and provide frequent, thorough oral care. Will sign off at this time, but please reorder if we can assist any further.    HPI HPI: Pt is a 74 y.o. 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 5 minutes of CPR completed. CT head was negative. CXR 12/25: Elevation of RIGHT diaphragm with atelectasis and/or infiltrate at RIGHT lung base. MBS completed on 10/4: moderate oral and gross pharyngeal dysphagia which was much worse than prior MBS 03/2020 with aspiration of liquids and rentention of barium in the esophagus. An NPO status was recommended at that time. Pt was seen by SLP on 05/04/20 and a dysphagia 3 diet was recommended with pt's acceptance of known aspiration risk.      SLP Plan  All goals met       Recommendations  Diet recommendations: Regular;Thin liquid (with pt/family accepting risk of aspiration) Liquids provided  via: Cup;Straw Medication Administration: Crushed with puree Supervision: Staff to assist with self feeding Compensations: Slow rate;Small sips/bites Postural Changes and/or Swallow Maneuvers: Seated upright 90 degrees;Upright 30-60 min after meal                Oral Care Recommendations: Oral care QID Follow up Recommendations: Skilled Nursing facility SLP Visit Diagnosis: Dysphagia, unspecified (R13.10) Plan: All goals met       GO                Osie Bond., M.A. [place] [facility] Pager [phone] Office [phone]  07/25/2020, 12:21 PM

## 2020-07-25 NOTE — Progress Notes (Signed)
Daily Progress Note   Patient Name: Jay Holloway       Date: 07/25/2020 DOB: May 03, 1946  Age: 74 y.o. MRN#: [id number] Attending Physician: Hollice Espy, MD Primary Care Physician: Pearson Grippe, MD Admit Date: 07/22/2020  Reason for Consultation/Follow-up: To discuss complex medical decision making related to patient's goals of care  Subjective: Patient is resting in bed eyes closed.  He responds when I ask how he's doing "ok".  I ask if there is anything I can do for him - he replies "no".  He looks fatigued.   I called his nephew, Jay Holloway.  We talked very briefly about the worsening respiratory status overnight - but that he was now back on 3L and comfortable.    We discussed the plan to send him back to [facility] when he is medically ready to go.  If he goes back as a long term patient then Jay Holloway would like for him to have Hospice support at [facility].   If he goes back under acute rehab then Jay Holloway would like [facility] to resume following him as they were before.   Assessment: Patient with some worsening of his respiratory status over night - possible heart failure.   Patient Profile/HPI:  74 y.o. male  with past medical history of prostate CA, DM, d-CHF with EF of 60-65%, CKD3, PE in 04/2020 now on Eliquis, and recurrent aspiration, who was admitted on 07/22/2020 with concern for out of hospital arrest.  He received 5 minutes of CPR at SNF.  On admission to the ER he was found to be in hypoxic and hypercarbic respiratory failure with aspiration pneumonia vs HCAP. He is more alert today and asking "how long have I been out?".  He is lethargic and somewhat disorientated.  He repeatedly asks me for water and to "call [facility]".  SLP worked with him this  morning.  He had signs of aspiration across all solid and liquid consistencies.  Diet was recommended as family desires for him to be fed despite the risk of aspiration.  Length of Stay: 3   Vital Signs: BP (!) 100/59 (BP Location: Left Arm)   Pulse (!) 110   Temp 98.2 F (36.8 C) (Axillary)   Resp 19   Ht 6\' 7"  (2.007 m)   Wt 98.4  kg   SpO2 99%   BMI 24.44 kg/m  SpO2: SpO2: 99 % O2 Device: O2 Device: High Flow Nasal Cannula O2 Flow Rate: O2 Flow Rate (L/min): 3 L/min       Palliative Assessment/Data: 30%     Palliative Care Plan    Recommendations/Plan: If he returns to [facility] under Acute Rehab please have [facility] resume following him. If he returns to [facility] under [place] requests that [facility] follow him.   Code Status:  Limited code DNI  Prognosis:  < 6 months Less than 6 months given 5 hospital admissions in 6 months, rapid decline, protein calorie malnutrition, recurrent aspiration pneumonia and hypoxic and hypercarbic respiratory failure. He is medically eligible for Hospice at the nursing facility.  Care plan was discussed with Nephew Mr. Worthey and Dr. [place] Pink  Thank you for allowing the Palliative Medicine Team to assist in the care of this patient.  Total time spent:  25 min.     Greater than 50%  of this time was spent counseling and coordinating care related to the above assessment and plan.  Florentina Jenny, PA-C Palliative Medicine  Please contact Palliative MedicineTeam phone at [phone] for questions and concerns between 7 am - 7 pm.   Please see AMION for individual provider pager numbers.

## 2020-07-25 NOTE — Progress Notes (Signed)
PROGRESS NOTE  Jay Holloway HQP:[id number] DOB: [birth year] DOA: 07/22/2020 PCP: [name], MD  HPI/Recap of past 4 hours: 74 year old male with past medical history of hypertension, diabetes mellitus, diastolic CHF, DVT/PE on Eliquis and stage IIIa chronic kidney disease presented from a skilled nursing facility after an episode of unresponsiveness.  There was a possible cardiac arrest at his nursing facility as they were unable to palpate a pulse and patient went 5 minutes CPR but upon arrival of EMS, patient was awake and with pulses.  Looks to be somewhat disoriented.  Oxygen saturations are 83%.  Brought into the emergency room and found to have sepsis secondary to pneumonia aspiration pneumonia with acute respiratory failure.  Admitted to the hospitalist service.  He has had previous admissions for dysphagia and is at high risk for recurrent aspiration.  Seen by speech therapy and placed on dysphagia 3 although this will not be complete.  Seen by palliative care and discussion with patient and his nephew done.  He seemed to misunderstand and was upset about his DNR bracelet and requested himself to be a full code.  Follow-up discussion planned for tomorrow.  In the interim, IV access lost and new PICC line placed on 12/26.  Since 12/26 evening, blood pressures have been slightly soft requiring some fluid boluses.  Patient received Percocet on morning of 12/27 which made him lethargic.  This improved after Percocet was discontinued.  Palliative care met with patient and nephew on 12/27 and  extensive discussion, he was switched to a partial code.  They understand of how frail he is and frequent hospitalizations and will consider hospice.  Overnight, oxygenation worsened and patient went from 3 L nasal cannula up to 8 L high flow nasal cannula this morning, patient feeling little bit better.  Still tired.  No pain.  Denies shortness of breath.  Assessment/Plan: Principal Problem: Severe sepsis  ([place]) present on admission from aspiration pneumonia from dysphagia causing acute respiratory failure with hypoxia and hypercapnia: Patient meets criteria given respiratory failure, pneumonia source and SIRS criteria.  Was initially improving.  I suspect his worsening oxygenation is due to heart failure.  See below.  White blood cell count remained stable.  His long-term aspiration risks remain high.  Palliative care following and further goals of care will be decided tomorrow.  Currently DNR. Active Problems:   Essential hypertension: Blood pressures remain soft requiring fluid boluses   GERD without esophagitis    HCAP (healthcare-associated pneumonia) Acute on chronic diastolic CHF (congestive heart failure) (HCC) mild elevation of BNP.  Initially nothing seen on chest x-ray or CT in form of fluid.  With aggressive fluid resuscitation, he now sounds wet and I am overloaded.  Will stop his IV fluids and try gentle IV Lasix as much as he can tolerate with blood pressure still being soft.  Senile dementia without behavioral disturbance: Mild although would favor any major medical decisions need to go through his nephew  Severe protein calorie malnutrition: Patient meets criteria in the context of chronic illness including his congestive heart failure and this is evidenced by his severe muscle and fat depletion.  Seen by nutrition.  Put on Ensure 3 times daily, Magic cup 3 times daily, Prosource twice daily plus multivitamin.  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  CBGs mildly elevated in the context of his acute illness.  A1c done 2-1/2 months ago elevated at  7.8.  Improved somewhat today.  Stage IV decubital ulcer: Present on admission.  Appreciate wound care help.  Receiving Santyl dressing plus saline moistened gauze then dry gauze or abdominal pad and changing daily.  Air  mattress.  Acute kidney injury CKD (chronic kidney disease), stage IIIa: Slight worsening of renal function, likely in the context of decompensated heart failure    Severe protein-calorie malnutrition ([place]): Nutrition to see.   Code Status: Partial code  Family Communication: Left message with nephew  Disposition Plan: Hospice versus skilled nursing   Consultants:  Palliative care  Procedures:  PICC line placement 12/26  Antimicrobials:  IV vancomycin 12/25-12/26  IV Zosyn 12/25-present  DVT prophylaxis: Heparin infusion   Objective: Vitals:   07/25/20 0804 07/25/20 1120  BP: 106/62 (!) 95/55  Pulse: (!) 111 (!) 116  Resp: (!) 22 18  Temp: 97.8 F (36.6 C) 97.7 F (36.5 C)  SpO2: 99%     Intake/Output Summary (Last 24 hours) at 07/25/2020 1225 Last data filed at 07/25/2020 0804 Gross per 24 hour  Intake 1111.76 ml  Output 750 ml  Net 361.76 ml   Filed Weights   07/22/20 2300 07/22/20 2321  Weight: 90.7 kg 98.4 kg   Body mass index is 24.44 kg/m.  Exam:   General: Oriented x2, fatigued, emaciated  HEENT: Normocephalic, atraumatic, mucous membranes are dry, poor dentition  Cardiovascular: Regular rate and rhythm, S1-S2, borderline tachycardia  Respiratory: Rales bilaterally  Abdomen: Soft, nontender, nondistended, hypoactive bowel sounds  Musculoskeletal: No clubbing or cyanosis, 1+ pitting edema  Psychiatry: More appropriate, no evidence of acute psychosis   Data Reviewed: CBC: Recent Labs  Lab 07/22/20 1628 07/22/20 1707 07/23/20 0104 07/24/20 0255 07/25/20 0036  WBC 11.1*  --  16.5* 9.5 8.7  NEUTROABS 6.5  --   --   --   --   HGB 10.9* 12.6* 10.5* 9.7* 9.1*  HCT 38.1* 37.0* 35.3* 34.3* 30.8*  MCV 94.5  --  94.6 95.8 92.2  PLT 401*  --  395 338 569   Basic Metabolic Panel: Recent Labs  Lab 07/22/20 1628 07/22/20 1707 07/22/20 1710 07/23/20 0104 07/24/20 0255 07/25/20 0036  NA 138 136  --  138 136 138  K 4.8 4.6  --   4.8 5.0 5.1  CL 97*  --   --  95* 92* 95*  CO2 32  --   --  33* 35* 35*  GLUCOSE 200*  --   --  211* 158* 229*  BUN 30*  --   --  29* 30* 34*  CREATININE 1.41*  --  1.40* 1.37* 1.48* 1.87*  CALCIUM 8.5*  --   --  8.5* 8.4* 8.3*  MG  --   --   --  1.8  --   --    GFR: Estimated Creatinine Clearance: 46.6 mL/min (A) (by C-G formula based on SCr of 1.87 mg/dL (H)). Liver Function Tests: Recent Labs  Lab 07/22/20 1628 07/24/20 0255  AST 36 20  ALT 45* 35  ALKPHOS 116 105  BILITOT 0.5 0.5  PROT 5.7* 5.5*  ALBUMIN 2.4* 2.2*   No results for input(s): LIPASE, AMYLASE in the last 168 hours. No results for input(s): AMMONIA in the last 168 hours. Coagulation Profile: No results for input(s): INR, PROTIME in the last 168 hours. Cardiac Enzymes: No results for input(s): CKTOTAL, CKMB, CKMBINDEX, TROPONINI in the last 168 hours. BNP (last 3 results) No results for input(s): PROBNP in the last 8760 hours. HbA1C:  No results for input(s): HGBA1C in the last 72 hours. CBG: Recent Labs  Lab 07/24/20 1158 07/24/20 1631 07/24/20 2057 07/25/20 0614 07/25/20 1117  GLUCAP 151* 131* 147* 183* 130*   Lipid Profile: No results for input(s): CHOL, HDL, LDLCALC, TRIG, CHOLHDL, LDLDIRECT in the last 72 hours. Thyroid Function Tests: No results for input(s): TSH, T4TOTAL, FREET4, T3FREE, THYROIDAB in the last 72 hours. Anemia Panel: No results for input(s): VITAMINB12, FOLATE, FERRITIN, TIBC, IRON, RETICCTPCT in the last 72 hours. Urine analysis:    Component Value Date/Time   COLORURINE AMBER (A) 04/28/2020 2016   APPEARANCEUR CLOUDY (A) 04/28/2020 2016   LABSPEC 1.020 04/28/2020 2016   PHURINE 5.0 04/28/2020 2016   GLUCOSEU NEGATIVE 04/28/2020 2016   HGBUR NEGATIVE 04/28/2020 2016   [place] NEGATIVE 04/28/2020 2016   KETONESUR NEGATIVE 04/28/2020 2016   PROTEINUR 100 (A) 04/28/2020 2016   UROBILINOGEN 0.2 11/05/2014 1712   NITRITE NEGATIVE 04/28/2020 2016   LEUKOCYTESUR LARGE  (A) 04/28/2020 2016   Sepsis Labs: _0 (procalcitonin:4,lacticidven:4)  ) Recent Results (from the past 240 hour(s))  Resp Panel by RT-PCR (Flu A&B, Covid) Nasopharyngeal Swab     Status: None   Collection Time: 07/22/20  4:37 PM   Specimen: Nasopharyngeal Swab; Nasopharyngeal(NP) swabs in vial transport medium  Result Value Ref Range Status   SARS Coronavirus 2 by RT PCR NEGATIVE NEGATIVE Final    Comment: (NOTE) SARS-CoV-2 target nucleic acids are NOT DETECTED.  The SARS-CoV-2 RNA is generally detectable in upper respiratory specimens during the acute phase of infection. The lowest concentration of SARS-CoV-2 viral copies this assay can detect is 138 copies/mL. A negative result does not preclude SARS-Cov-2 infection and should not be used as the sole basis for treatment or other patient management decisions. A negative result may occur with  improper specimen collection/handling, submission of specimen other than nasopharyngeal swab, presence of viral mutation(s) within the areas targeted by this assay, and inadequate number of viral copies(<138 copies/mL). A negative result must be combined with clinical observations, patient history, and epidemiological information. The expected result is Negative.  Fact Sheet for Patients:  EntrepreneurPulse.com.au  Fact Sheet for Healthcare Providers:  IncredibleEmployment.be  This test is no t yet approved or cleared by the [place] FDA and  has been authorized for detection and/or diagnosis of SARS-CoV-2 by FDA under an Emergency Use Authorization (EUA). This EUA will remain  in effect (meaning this test can be used) for the duration of the COVID-19 declaration under Section 564(b)(1) of the Act, 21 U.S.C.section 360bbb-3(b)(1), unless the authorization is terminated  or revoked sooner.       Influenza A by PCR NEGATIVE NEGATIVE Final   Influenza B by PCR NEGATIVE NEGATIVE Final     Comment: (NOTE) The Xpert Xpress SARS-CoV-2/FLU/RSV plus assay is intended as an aid in the diagnosis of influenza from Nasopharyngeal swab specimens and should not be used as a sole basis for treatment. Nasal washings and aspirates are unacceptable for Xpert Xpress SARS-CoV-2/FLU/RSV testing.  Fact Sheet for Patients: EntrepreneurPulse.com.au  Fact Sheet for Healthcare Providers: IncredibleEmployment.be  This test is not yet approved or cleared by the [place] FDA and has been authorized for detection and/or diagnosis of SARS-CoV-2 by FDA under an Emergency Use Authorization (EUA). This EUA will remain in effect (meaning this test can be used) for the duration of the COVID-19 declaration under Section 564(b)(1) of the Act, 21 U.S.C. section 360bbb-3(b)(1), unless the authorization is terminated or revoked.  Performed at [facility]  Hospital Lab, [place] [address] [place], [place] [address]   Culture, blood (Routine X 2) w Reflex to ID Panel     Status: None (Preliminary result)   Collection Time: 07/22/20  9:20 PM   Specimen: BLOOD LEFT HAND  Result Value Ref Range Status   Specimen Description BLOOD LEFT HAND  Final   Special Requests   Final    BOTTLES DRAWN AEROBIC AND ANAEROBIC Blood Culture adequate volume   Culture   Final    NO GROWTH 2 DAYS Performed at [facility], [place] [address], [place], [place] [address]    Report Status PENDING  Incomplete  Culture, blood (Routine X 2) w Reflex to ID Panel     Status: None (Preliminary result)   Collection Time: 07/22/20  9:20 PM   Specimen: BLOOD RIGHT HAND  Result Value Ref Range Status   Specimen Description BLOOD RIGHT HAND  Final   Special Requests   Final    BOTTLES DRAWN AEROBIC AND ANAEROBIC Blood Culture adequate volume   Culture   Final    NO GROWTH 2 DAYS Performed at [facility], [place] [address], [place], [place] [address]    Report Status PENDING  Incomplete  MRSA  PCR Screening     Status: None   Collection Time: 07/23/20 12:00 AM   Specimen: Nasopharyngeal  Result Value Ref Range Status   MRSA by PCR NEGATIVE NEGATIVE Final    Comment:        The GeneXpert MRSA Assay (FDA approved for NASAL specimens only), is one component of a comprehensive MRSA colonization surveillance program. It is not intended to diagnose MRSA infection nor to guide or monitor treatment for MRSA infections. Performed at [facility], [place] [address], [place], [place] [address]       Studies: No results found.  Scheduled Meds: . (feeding supplement) PROSource Plus  30 mL Oral BID BM  . atorvastatin  10 mg Oral Daily  . Chlorhexidine Gluconate Cloth  6 each Topical Daily  . cholecalciferol  1,000 Units Oral Daily  . collagenase   Topical Daily  . feeding supplement  237 mL Oral TID BM  . gabapentin  100 mg Oral TID  . insulin aspart  0-5 Units Subcutaneous QHS  . insulin aspart  0-9 Units Subcutaneous TID WC  . insulin glargine  7 Units Subcutaneous Daily  . magnesium oxide  400 mg Oral Daily  . multivitamin with minerals  1 tablet Oral Daily  . pantoprazole  40 mg Oral BID  . sodium chloride flush  10-40 mL Intracatheter Q12H    Continuous Infusions: . sodium chloride Stopped (07/24/20 1654)  . heparin 1,350 Units/hr (07/25/20 0305)  . piperacillin-tazobactam (ZOSYN)  IV 3.375 g (07/25/20 1022)     LOS: 3 days     Annita Brod, MD [facility]   07/25/2020, 12:25 PM

## 2020-07-25 NOTE — Progress Notes (Signed)
ANTICOAGULATION CONSULT NOTE  Pharmacy Consult for heparin Indication: recent h/o PE/DVT 04/2020  Labs: Recent Labs    07/22/20 1628 07/22/20 1707 07/22/20 1839 07/23/20 0104 07/23/20 0847 07/23/20 0847 07/23/20 2100 07/24/20 0255 07/25/20 0036  HGB 10.9*   < >  --  10.5*  --   --   --  9.7* 9.1*  HCT 38.1*   < >  --  35.3*  --   --   --  34.3* 30.8*  PLT 401*  --   --  395  --   --   --  338 309  APTT  --   --   --   --  55*   < > 83* 95* 77*  HEPARINUNFRC  --   --   --   --  >2.20*  --   --  >2.20* >2.20*  CREATININE 1.41*   < >  --  1.37*  --   --   --  1.48* 1.87*  TROPONINIHS 13  --  12  --   --   --   --   --   --    < > = values in this interval not displayed.    Assessment: 74yo male presents from NH after period of apnea and unresponsiveness, admitted for acute respiratory failure and sepsis, to transition from Eliquis to heparin for PE/DVT (Oct 2021) while unable to take PO meds; last dose of Eliquis taken 12/25 9a.  Heparin level falsely elevated given recent DOAC dose, aPTT is therapeutic at 77 seconds, CBC stable.  Goal of Therapy:  Heparin level 0.3-0.7 units/ml aPTT 66-102 seconds Monitor platelets by anticoagulation protocol: Yes   Plan:  Continue heparin at 1350 units/hr F/u aPTT until correlates with heparin level  Monitor daily aPTT, HL, CBC/plt Monitor for signs/symptoms of bleeding  F/u restart apixaban   Fredonia Highland, PharmD, BCPS, [facility] Clinical Pharmacist [phone] Please check AMION for all [facility] Pharmacy numbers 07/25/2020

## 2020-07-25 NOTE — Plan of Care (Signed)

## 2020-07-25 NOTE — Progress Notes (Signed)
Pharmacy Antibiotic Note  Jay Holloway is a 74 y.o. male admitted on 07/22/2020 with pneumonia.  Pharmacy has been consulted for vancomycin and zosyn dosing. Vancomycin stopped with negative MRSA PCR. Cr up this morning.  Plan: Continue Zosyn 3.375g IV EI q8h Follow Cr, LOT, cultures  Height: 6\' 7"  (200.7 cm) Weight: 98.4 kg (216 lb 14.9 oz) IBW/kg (Calculated) : 93.7  Temp (24hrs), Avg:98.1 F (36.7 C), Min:97.5 F (36.4 C), Max:98.6 F (37 C)  Recent Labs  Lab 07/22/20 1628 07/22/20 1710 07/22/20 2303 07/23/20 0104 07/24/20 0255 07/25/20 0036  WBC 11.1*  --   --  16.5* 9.5 8.7  CREATININE 1.41* 1.40*  --  1.37* 1.48* 1.87*  LATICACIDVEN  --   --  1.9  --  0.7  --     Estimated Creatinine Clearance: 46.6 mL/min (A) (by C-G formula based on SCr of 1.87 mg/dL (H)).    No Known Allergies  07/27/20, PharmD, BCPS, [facility] Clinical Pharmacist [phone] Please check AMION for all [facility] Pharmacy numbers 07/25/2020

## 2020-07-26 DIAGNOSIS — I5033 Acute on chronic diastolic (congestive) heart failure: Secondary | ICD-10-CM | POA: Diagnosis not present

## 2020-07-26 DIAGNOSIS — N1832 Chronic kidney disease, stage 3b: Secondary | ICD-10-CM | POA: Diagnosis not present

## 2020-07-26 DIAGNOSIS — A419 Sepsis, unspecified organism: Secondary | ICD-10-CM | POA: Diagnosis not present

## 2020-07-26 DIAGNOSIS — J690 Pneumonitis due to inhalation of food and vomit: Secondary | ICD-10-CM | POA: Diagnosis not present

## 2020-07-26 LAB — BASIC METABOLIC PANEL
Anion gap: 6 (ref 5–15)
BUN: 34 mg/dL — ABNORMAL HIGH (ref 8–23)
CO2: 35 mmol/L — ABNORMAL HIGH (ref 22–32)
Calcium: 8.1 mg/dL — ABNORMAL LOW (ref 8.9–10.3)
Chloride: 97 mmol/L — ABNORMAL LOW (ref 98–111)
Creatinine, Ser: 1.82 mg/dL — ABNORMAL HIGH (ref 0.61–1.24)
GFR, Estimated: 39 mL/min — ABNORMAL LOW (ref >60)
Glucose, Bld: 133 mg/dL — ABNORMAL HIGH (ref 70–99)
Potassium: 4.9 mmol/L (ref 3.5–5.1)
Sodium: 138 mmol/L (ref 135–145)

## 2020-07-26 LAB — CBC
HCT: 27.6 % — ABNORMAL LOW (ref 39.0–52.0)
Hemoglobin: 8.2 g/dL — ABNORMAL LOW (ref 13.0–17.0)
MCH: 27.2 pg (ref 26.0–34.0)
MCHC: 29.7 g/dL — ABNORMAL LOW (ref 30.0–36.0)
MCV: 91.4 fL (ref 80.0–100.0)
Platelets: 264 10*3/uL (ref 150–400)
RBC: 3.02 MIL/uL — ABNORMAL LOW (ref 4.22–5.81)
RDW: 16 % — ABNORMAL HIGH (ref 11.5–15.5)
WBC: 7.8 10*3/uL (ref 4.0–10.5)
nRBC: 0 % (ref 0.0–0.2)

## 2020-07-26 LAB — GLUCOSE, CAPILLARY
Glucose-Capillary: 104 mg/dL — ABNORMAL HIGH (ref 70–99)
Glucose-Capillary: 114 mg/dL — ABNORMAL HIGH (ref 70–99)
Glucose-Capillary: 123 mg/dL — ABNORMAL HIGH (ref 70–99)
Glucose-Capillary: 143 mg/dL — ABNORMAL HIGH (ref 70–99)

## 2020-07-26 LAB — APTT: aPTT: 81 seconds — ABNORMAL HIGH (ref 24–36)

## 2020-07-26 LAB — HEPARIN LEVEL (UNFRACTIONATED): Heparin Unfractionated: 1.42 IU/mL — ABNORMAL HIGH (ref 0.30–0.70)

## 2020-07-26 MED ORDER — APIXABAN 5 MG PO TABS
5.0000 mg | ORAL_TABLET | Freq: Two times a day (BID) | ORAL | Status: DC
  Administered 2020-07-26 – 2020-07-31 (×11): 5 mg via ORAL
  Filled 2020-07-26 (×11): qty 1

## 2020-07-26 NOTE — Plan of Care (Signed)

## 2020-07-26 NOTE — Progress Notes (Signed)
PROGRESS NOTE  Jay Holloway LKT:[id number] DOB: 03-18-1946 DOA: 07/22/2020 PCP: [name], MD  HPI/Recap of past 11 hours: 74 year old male with past medical history of hypertension, diabetes mellitus, diastolic CHF, DVT/PE on Eliquis and stage IIIa chronic kidney disease presented from a skilled nursing facility after an episode of unresponsiveness.  There was a possible cardiac arrest at his nursing facility as they were unable to palpate a pulse and patient went 5 minutes CPR but upon arrival of EMS, patient was awake and with pulses.  Looks to be somewhat disoriented.  Oxygen saturations are 83%.  Brought into the emergency room and found to have sepsis secondary to pneumonia aspiration pneumonia with acute respiratory failure.  Admitted to the hospitalist service.  He has had previous admissions for dysphagia and is at high risk for recurrent aspiration.  Seen by speech therapy and placed on dysphagia 3 although this will not be complete.  Seen by palliative care and discussion with patient and his nephew done.  He seemed to misunderstand and was upset about his DNR bracelet and requested himself to be a full code.  Follow-up discussion planned for tomorrow.  In the interim, IV access lost and new PICC line placed on 12/26.  Since 12/26 evening, blood pressures have been slightly soft requiring some fluid boluses.  Patient received Percocet on morning of 12/27 which made him lethargic.  This improved after Percocet was discontinued.  Palliative care met with patient and nephew on 12/27 and  extensive discussion, he was switched to a partial code.  They understand of how frail he is and frequent hospitalizations and will consider hospice.  On night of 12/27, oxygenation worsened due to patient being volume overloaded from aggressive fluid resuscitation and he required 8 L.  Able to be weaned back down to 3 L x 12/28.  Fluid stopped, started on diuresis and he has since diuresed almost 1.5 L of  fluid.  This morning, he is more somnolent.  Tired.  Denies any pain.  States that his breathing is okay.  Assessment/Plan: Principal Problem: Severe sepsis ([place]) present on admission from aspiration pneumonia from dysphagia causing acute respiratory failure with hypoxia and hypercapnia: Patient meets criteria given respiratory failure, pneumonia source and SIRS criteria.  Sepsis itself has resolved.  White blood cell count normal.  Respiratory failure issues have transitioned from aspiration pneumonia to heart failure now as etiology.  His long-term aspiration risks remain high.  Palliative care following and hospice will follow patient when he returns back to skilled nursing.  Now partial code. Active Problems:   Essential hypertension: Blood pressures remain soft requiring fluid boluses   GERD without esophagitis   Acute on chronic diastolic CHF (congestive heart failure) (HCC) mild elevation of BNP.  Initially nothing seen on chest x-ray or CT in form of fluid.  With aggressive fluid resuscitation, he became volume overloaded and now fluid stopped and he is being diuresed.    Senile dementia without behavioral disturbance: Mild although would favor any major medical decisions need to go through his nephew  Severe protein calorie malnutrition: Patient meets criteria in the context of chronic illness including his congestive heart failure and this is evidenced by his severe muscle and fat depletion.  Seen by nutrition.  Put on Ensure 3 times daily, Magic cup 3 times daily, Prosource twice daily plus multivitamin.  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  CBGs  mildly elevated in the context of his acute illness.  A1c done 2-1/2 months ago elevated at 7.8.  Improved somewhat today.  Stage IV decubital ulcer: Present on admission.  Appreciate wound care help.  Receiving Santyl dressing  plus saline moistened gauze then dry gauze or abdominal pad and changing daily.  Air mattress.  Acute kidney injury CKD (chronic kidney disease), stage IIIa: About the same.  In the context of decompensated heart failure.    Code Status: Partial code  Family Communication: Left message for nephew.  Disposition Plan: Overall stable.  Needs to be further diuresed and hoping that we can discharge him in the next 1 to 2 days back to skilled nursing with hospice   Consultants:  Palliative care  Procedures:  PICC line placement 12/26  Antimicrobials:  IV vancomycin 12/25-12/26  IV Zosyn 12/25-12/31  DVT prophylaxis: Heparin infusion   Objective: Vitals:   07/26/20 0700 07/26/20 1000  BP: 104/64   Pulse: (!) 101   Resp: 14   Temp: 97.9 F (36.6 C)   SpO2: 97% 95%    Intake/Output Summary (Last 24 hours) at 07/26/2020 1057 Last data filed at 07/26/2020 0424 Gross per 24 hour  Intake 482.46 ml  Output 700 ml  Net -217.54 ml   Filed Weights   07/22/20 2300 07/22/20 2321  Weight: 90.7 kg 98.4 kg   Body mass index is 24.44 kg/m.  Exam:   General: Fatigued, emaciated.  HEENT: Normocephalic, atraumatic, mucous membranes are dry, poor dentition  Cardiovascular: Regular rate and rhythm, S1-S2, borderline tachycardia  Respiratory: Rales bilaterally, decreased breath sounds bibasilar  Abdomen: Soft, nontender, nondistended, hypoactive bowel sounds  Musculoskeletal: No clubbing or cyanosis, 1+ pitting edema  Psychiatry: More somnolent today, no evidence of acute psychosis   Data Reviewed: CBC: Recent Labs  Lab 07/22/20 1628 07/22/20 1707 07/23/20 0104 07/24/20 0255 07/25/20 0036 07/26/20 0056  WBC 11.1*  --  16.5* 9.5 8.7 7.8  NEUTROABS 6.5  --   --   --   --   --   HGB 10.9* 12.6* 10.5* 9.7* 9.1* 8.2*  HCT 38.1* 37.0* 35.3* 34.3* 30.8* 27.6*  MCV 94.5  --  94.6 95.8 92.2 91.4  PLT 401*  --  395 338 309 062   Basic Metabolic Panel: Recent Labs   Lab 07/22/20 1628 07/22/20 1707 07/22/20 1710 07/23/20 0104 07/24/20 0255 07/25/20 0036 07/26/20 0056  NA 138 136  --  138 136 138 138  K 4.8 4.6  --  4.8 5.0 5.1 4.9  CL 97*  --   --  95* 92* 95* 97*  CO2 32  --   --  33* 35* 35* 35*  GLUCOSE 200*  --   --  211* 158* 229* 133*  BUN 30*  --   --  29* 30* 34* 34*  CREATININE 1.41*  --  1.40* 1.37* 1.48* 1.87* 1.82*  CALCIUM 8.5*  --   --  8.5* 8.4* 8.3* 8.1*  MG  --   --   --  1.8  --   --   --    GFR: Estimated Creatinine Clearance: 47.9 mL/min (A) (by C-G formula based on SCr of 1.82 mg/dL (H)). Liver Function Tests: Recent Labs  Lab 07/22/20 1628 07/24/20 0255  AST 36 20  ALT 45* 35  ALKPHOS 116 105  BILITOT 0.5 0.5  PROT 5.7* 5.5*  ALBUMIN 2.4* 2.2*   No results for input(s): LIPASE, AMYLASE in the last 168 hours. No results for  input(s): AMMONIA in the last 168 hours. Coagulation Profile: No results for input(s): INR, PROTIME in the last 168 hours. Cardiac Enzymes: No results for input(s): CKTOTAL, CKMB, CKMBINDEX, TROPONINI in the last 168 hours. BNP (last 3 results) No results for input(s): PROBNP in the last 8760 hours. HbA1C: No results for input(s): HGBA1C in the last 72 hours. CBG: Recent Labs  Lab 07/25/20 0614 07/25/20 1117 07/25/20 1520 07/25/20 2120 07/26/20 0625  GLUCAP 183* 130* 181* 144* 104*   Lipid Profile: No results for input(s): CHOL, HDL, LDLCALC, TRIG, CHOLHDL, LDLDIRECT in the last 72 hours. Thyroid Function Tests: No results for input(s): TSH, T4TOTAL, FREET4, T3FREE, THYROIDAB in the last 72 hours. Anemia Panel: No results for input(s): VITAMINB12, FOLATE, FERRITIN, TIBC, IRON, RETICCTPCT in the last 72 hours. Urine analysis:    Component Value Date/Time   COLORURINE AMBER (A) 04/28/2020 2016   APPEARANCEUR CLOUDY (A) 04/28/2020 2016   LABSPEC 1.020 04/28/2020 2016   PHURINE 5.0 04/28/2020 2016   GLUCOSEU NEGATIVE 04/28/2020 2016   HGBUR NEGATIVE 04/28/2020 2016    [place] NEGATIVE 04/28/2020 2016   KETONESUR NEGATIVE 04/28/2020 2016   PROTEINUR 100 (A) 04/28/2020 2016   UROBILINOGEN 0.2 11/05/2014 1712   NITRITE NEGATIVE 04/28/2020 2016   LEUKOCYTESUR LARGE (A) 04/28/2020 2016   Sepsis Labs: _0 (procalcitonin:4,lacticidven:4)  ) Recent Results (from the past 240 hour(s))  Resp Panel by RT-PCR (Flu A&B, Covid) Nasopharyngeal Swab     Status: None   Collection Time: 07/22/20  4:37 PM   Specimen: Nasopharyngeal Swab; Nasopharyngeal(NP) swabs in vial transport medium  Result Value Ref Range Status   SARS Coronavirus 2 by RT PCR NEGATIVE NEGATIVE Final    Comment: (NOTE) SARS-CoV-2 target nucleic acids are NOT DETECTED.  The SARS-CoV-2 RNA is generally detectable in upper respiratory specimens during the acute phase of infection. The lowest concentration of SARS-CoV-2 viral copies this assay can detect is 138 copies/mL. A negative result does not preclude SARS-Cov-2 infection and should not be used as the sole basis for treatment or other patient management decisions. A negative result may occur with  improper specimen collection/handling, submission of specimen other than nasopharyngeal swab, presence of viral mutation(s) within the areas targeted by this assay, and inadequate number of viral copies(<138 copies/mL). A negative result must be combined with clinical observations, patient history, and epidemiological information. The expected result is Negative.  Fact Sheet for Patients:  EntrepreneurPulse.com.au  Fact Sheet for Healthcare Providers:  IncredibleEmployment.be  This test is no t yet approved or cleared by the [place] FDA and  has been authorized for detection and/or diagnosis of SARS-CoV-2 by FDA under an Emergency Use Authorization (EUA). This EUA will remain  in effect (meaning this test can be used) for the duration of the COVID-19 declaration under Section 564(b)(1) of  the Act, 21 U.S.C.section 360bbb-3(b)(1), unless the authorization is terminated  or revoked sooner.       Influenza A by PCR NEGATIVE NEGATIVE Final   Influenza B by PCR NEGATIVE NEGATIVE Final    Comment: (NOTE) The Xpert Xpress SARS-CoV-2/FLU/RSV plus assay is intended as an aid in the diagnosis of influenza from Nasopharyngeal swab specimens and should not be used as a sole basis for treatment. Nasal washings and aspirates are unacceptable for Xpert Xpress SARS-CoV-2/FLU/RSV testing.  Fact Sheet for Patients: EntrepreneurPulse.com.au  Fact Sheet for Healthcare Providers: IncredibleEmployment.be  This test is not yet approved or cleared by the [place] FDA and has been authorized for detection and/or diagnosis of  SARS-CoV-2 by FDA under an Emergency Use Authorization (EUA). This EUA will remain in effect (meaning this test can be used) for the duration of the COVID-19 declaration under Section 564(b)(1) of the Act, 21 U.S.C. section 360bbb-3(b)(1), unless the authorization is terminated or revoked.  Performed at [facility], [place] [address] [place], [place] [address]   Culture, blood (Routine X 2) w Reflex to ID Panel     Status: None (Preliminary result)   Collection Time: 07/22/20  9:20 PM   Specimen: BLOOD LEFT HAND  Result Value Ref Range Status   Specimen Description BLOOD LEFT HAND  Final   Special Requests   Final    BOTTLES DRAWN AEROBIC AND ANAEROBIC Blood Culture adequate volume   Culture   Final    NO GROWTH 4 DAYS Performed at [facility], [place] [address] [place], [place] [address]    Report Status PENDING  Incomplete  Culture, blood (Routine X 2) w Reflex to ID Panel     Status: None (Preliminary result)   Collection Time: 07/22/20  9:20 PM   Specimen: BLOOD RIGHT HAND  Result Value Ref Range Status   Specimen Description BLOOD RIGHT HAND  Final   Special Requests   Final    BOTTLES DRAWN AEROBIC  AND ANAEROBIC Blood Culture adequate volume   Culture   Final    NO GROWTH 4 DAYS Performed at [facility], [place] [address] [place], [place] [address]    Report Status PENDING  Incomplete  MRSA PCR Screening     Status: None   Collection Time: 07/23/20 12:00 AM   Specimen: Nasopharyngeal  Result Value Ref Range Status   MRSA by PCR NEGATIVE NEGATIVE Final    Comment:        The GeneXpert MRSA Assay (FDA approved for NASAL specimens only), is one component of a comprehensive MRSA colonization surveillance program. It is not intended to diagnose MRSA infection nor to guide or monitor treatment for MRSA infections. Performed at [facility], [place] [address] [place], [place] [address]       Studies: No results found.  Scheduled Meds: . (feeding supplement) PROSource Plus  30 mL Oral BID BM  . apixaban  5 mg Oral BID  . atorvastatin  10 mg Oral Daily  . Chlorhexidine Gluconate Cloth  6 each Topical Daily  . cholecalciferol  1,000 Units Oral Daily  . collagenase   Topical Daily  . feeding supplement  237 mL Oral TID BM  . furosemide  20 mg Intravenous BID  . gabapentin  100 mg Oral TID  . insulin aspart  0-5 Units Subcutaneous QHS  . insulin aspart  0-9 Units Subcutaneous TID WC  . insulin glargine  7 Units Subcutaneous Daily  . magnesium oxide  400 mg Oral Daily  . multivitamin with minerals  1 tablet Oral Daily  . pantoprazole  40 mg Oral BID  . sodium chloride flush  10-40 mL Intracatheter Q12H    Continuous Infusions: . piperacillin-tazobactam (ZOSYN)  IV 3.375 g (07/26/20 1011)     LOS: 4 days     Annita Brod, MD [facility]   07/26/2020, 10:57 AM

## 2020-07-26 NOTE — Plan of Care (Signed)

## 2020-07-27 DIAGNOSIS — E43 Unspecified severe protein-calorie malnutrition: Secondary | ICD-10-CM | POA: Diagnosis not present

## 2020-07-27 DIAGNOSIS — J690 Pneumonitis due to inhalation of food and vomit: Secondary | ICD-10-CM | POA: Diagnosis not present

## 2020-07-27 DIAGNOSIS — I472 Ventricular tachycardia, unspecified: Secondary | ICD-10-CM

## 2020-07-27 DIAGNOSIS — J9601 Acute respiratory failure with hypoxia: Secondary | ICD-10-CM | POA: Diagnosis not present

## 2020-07-27 LAB — COMPREHENSIVE METABOLIC PANEL
ALT: 30 U/L (ref 0–44)
AST: 20 U/L (ref 15–41)
Albumin: 1.9 g/dL — ABNORMAL LOW (ref 3.5–5.0)
Alkaline Phosphatase: 75 U/L (ref 38–126)
Anion gap: 7 (ref 5–15)
BUN: 34 mg/dL — ABNORMAL HIGH (ref 8–23)
CO2: 35 mmol/L — ABNORMAL HIGH (ref 22–32)
Calcium: 8.5 mg/dL — ABNORMAL LOW (ref 8.9–10.3)
Chloride: 97 mmol/L — ABNORMAL LOW (ref 98–111)
Creatinine, Ser: 1.72 mg/dL — ABNORMAL HIGH (ref 0.61–1.24)
GFR, Estimated: 41 mL/min — ABNORMAL LOW (ref >60)
Glucose, Bld: 189 mg/dL — ABNORMAL HIGH (ref 70–99)
Potassium: 4.8 mmol/L (ref 3.5–5.1)
Sodium: 139 mmol/L (ref 135–145)
Total Bilirubin: 0.6 mg/dL (ref 0.3–1.2)
Total Protein: 5.1 g/dL — ABNORMAL LOW (ref 6.5–8.1)

## 2020-07-27 LAB — CULTURE, BLOOD (ROUTINE X 2)
Culture: NO GROWTH
Culture: NO GROWTH
Special Requests: ADEQUATE
Special Requests: ADEQUATE

## 2020-07-27 LAB — CBC
HCT: 30.3 % — ABNORMAL LOW (ref 39.0–52.0)
Hemoglobin: 8.7 g/dL — ABNORMAL LOW (ref 13.0–17.0)
MCH: 27 pg (ref 26.0–34.0)
MCHC: 28.7 g/dL — ABNORMAL LOW (ref 30.0–36.0)
MCV: 94.1 fL (ref 80.0–100.0)
Platelets: 265 10*3/uL (ref 150–400)
RBC: 3.22 MIL/uL — ABNORMAL LOW (ref 4.22–5.81)
RDW: 15.7 % — ABNORMAL HIGH (ref 11.5–15.5)
WBC: 7.9 10*3/uL (ref 4.0–10.5)
nRBC: 0 % (ref 0.0–0.2)

## 2020-07-27 LAB — BASIC METABOLIC PANEL
Anion gap: 8 (ref 5–15)
BUN: 30 mg/dL — ABNORMAL HIGH (ref 8–23)
CO2: 36 mmol/L — ABNORMAL HIGH (ref 22–32)
Calcium: 8.6 mg/dL — ABNORMAL LOW (ref 8.9–10.3)
Chloride: 97 mmol/L — ABNORMAL LOW (ref 98–111)
Creatinine, Ser: 1.62 mg/dL — ABNORMAL HIGH (ref 0.61–1.24)
GFR, Estimated: 45 mL/min — ABNORMAL LOW (ref >60)
Glucose, Bld: 142 mg/dL — ABNORMAL HIGH (ref 70–99)
Potassium: 4.5 mmol/L (ref 3.5–5.1)
Sodium: 141 mmol/L (ref 135–145)

## 2020-07-27 LAB — GLUCOSE, CAPILLARY
Glucose-Capillary: 170 mg/dL — ABNORMAL HIGH (ref 70–99)
Glucose-Capillary: 126 mg/dL — ABNORMAL HIGH (ref 70–99)
Glucose-Capillary: 103 mg/dL — ABNORMAL HIGH (ref 70–99)
Glucose-Capillary: 146 mg/dL — ABNORMAL HIGH (ref 70–99)

## 2020-07-27 LAB — MAGNESIUM: Magnesium: 1.9 mg/dL (ref 1.7–2.4)

## 2020-07-27 LAB — SARS CORONAVIRUS 2 (TAT 6-24 HRS): SARS Coronavirus 2: NEGATIVE

## 2020-07-27 MED ORDER — AMOXICILLIN-POT CLAVULANATE 875-125 MG PO TABS: 1.0000 | ORAL_TABLET | Freq: Two times a day (BID) | ORAL | 0 refills | Status: AC

## 2020-07-27 MED ORDER — COLLAGENASE 250 UNIT/GM EX OINT: TOPICAL_OINTMENT | Freq: Every day | CUTANEOUS | 0 refills | Status: AC

## 2020-07-27 MED ORDER — INSULIN GLARGINE 100 UNIT/ML ~~LOC~~ SOLN: 7.0000 [IU] | Freq: Every day | SUBCUTANEOUS | 11 refills | Status: AC

## 2020-07-27 MED ORDER — DILTIAZEM HCL 60 MG PO TABS
60.0000 mg | ORAL_TABLET | Freq: Three times a day (TID) | ORAL | Status: DC
  Administered 2020-07-27 – 2020-07-31 (×13): 60 mg via ORAL
  Filled 2020-07-27 (×14): qty 1

## 2020-07-27 NOTE — Care Management Important Message (Signed)
Important Message  Patient Details  Name: Jay Holloway MRN: [id number] Date of Birth: August 08, 1945   Medicare Important Message Given:  Yes     [name] Stefan Church 07/27/2020, 3:55 PM

## 2020-07-27 NOTE — Progress Notes (Signed)
[facility])  Referral received for hospice services at his facility when he d/c's likely on Friday.  Spoke with his nephew and NOK, he confirms this desire.  Provided support and answered questions.  [facility] will enroll Jay Holloway to hospice when he returns to his facility.  Wallis Bamberg RN, BSN, CCRN [facility] Liaison

## 2020-07-27 NOTE — Progress Notes (Signed)
[name] can take pt back. They will start auth. CSW notified pt nephew.

## 2020-07-27 NOTE — Discharge Summary (Signed)
Discharge Summary  Jay Holloway [id number] DOB: 19-Mar-1946  PCP: [name], MD  Admit date: 07/22/2020 Discharge date: 07/27/2020  Time spent: 25 minutes  Recommendations for Outpatient Follow-up:  1. New medication: Augmentin 875 p.o. twice daily x3 days 2. Diet adjustment: Dysphagia 3 diet 3. Patient will be following with hospice after returning back to his skilled nursing facility 4. To clarify, patient will continue Eliquis 5 mg p.o. twice daily 5. Medication change: Percocet discontinued  Discharge Diagnoses:  Active Hospital Problems   Diagnosis Date Noted  . Sepsis ([place]) 07/22/2020  . Palliative care encounter   . Severe protein-calorie malnutrition ([place]) 07/23/2020  . HCAP (healthcare-associated pneumonia) 07/22/2020  . Chronic diastolic CHF (congestive heart failure) ([place]) 07/22/2020  . Type II diabetes mellitus with renal manifestations ([place]) 07/22/2020  . Decubital ulcer 07/22/2020  . Unresponsiveness 07/22/2020  . HLD (hyperlipidemia) 07/22/2020  . CKD (chronic kidney disease), stage IIIa 07/22/2020  . Aspiration pneumonia ([place]) 07/22/2020  . Acute respiratory failure with hypoxia and hypercapnia ([place]) 10/Jay/2021  . GERD without esophagitis 04/14/2020  . Essential hypertension     Resolved Hospital Problems  No resolved problems to display.    Discharge Condition: Improved, being discharged back to skilled nursing  Diet recommendation: Dysphagia 3 diet with thin liquids.  Continue multivitamin plus Ensure 3 times daily plus Prosource 3 times daily  Vitals:   07/27/20 0826 07/27/20 1128  BP: 106/71 101/63  Pulse: 97 89  Resp: 15 17  Temp: 97.8 F (36.6 C) 98.3 F (36.8 C)  SpO2: 97% 100%    History of present illness:  74 year old Holloway with past medical history of hypertension, diabetes mellitus, diastolic CHF, DVT/PE on Eliquis and stage IIIa chronic kidney disease presented from a skilled nursing facility after an episode of unresponsiveness.   There was a possible cardiac arrest at his nursing facility as they were unable to palpate a pulse and patient went 5 minutes CPR but upon arrival of EMS, patient was awake and with pulses.  Looks to be somewhat disoriented.  Oxygen saturations are 83%.  Brought into the emergency room and found to have sepsis secondary to pneumonia aspiration pneumonia with acute respiratory failure.  Admitted to the hospitalist service.   Hospital Course:  Severe sepsis [facility]) present on admission from aspiration pneumonia from dysphagia causing acute respiratory failure with hypoxia and hypercapnia: Patient meets criteria given respiratory failure, pneumonia source and SIRS criteria.  Sepsis itself has resolved.  White blood cell count normal.  Respiratory failure issues have transitioned from aspiration pneumonia to heart failure now as etiology.  His long-term aspiration risks remain high.  He has had previous admissions for dysphagia and is at high risk for recurrent aspiration.  Seen by speech therapy and placed on dysphagia 3 although this will not be complete.  Seen by palliative care and discussion with patient and his nephew done. Discharged on dysphagia 3 diet.  After extensive discussions with palliative care, it was decided that hospice will follow patient when he returns back to skilled nursing.  Now partial code. Active Problems:   Essential hypertension: Blood pressures remain soft requiring fluid boluses   GERD without esophagitis  Acute on chronic diastolic CHF (congestive heart failure) (HCC) mild elevation of BNP.  Initially nothing seen on chest x-ray or CT in form of fluid.  With aggressive fluid resuscitation, he became volume overloaded IV fluids stopped and he was started on IV Lasix.  He has since diuresed over 4 L of fluid.  Oxygenation much improved.  Senile dementia without behavioral disturbance: Mild, and patient is interactive.  Major medical decision should go through his nephew.  We  also stopped his Percocet which made the patient much more alert during his hospitalization.  He has not requested Percocet since it was discontinued and he seems to be much more alert without it.  Severe protein calorie malnutrition: Patient meets criteria in the context of chronic illness including his congestive heart failure and this is evidenced by his severe muscle and fat depletion.  Seen by nutrition.  Put on Ensure 3 times daily, Magic cup 3 times daily, Prosource twice daily plus multivitamin.  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  CBGs mildly elevated in the context of his acute illness.  A1c done 2-1/2 months ago elevated at 7.8.  Improved somewhat today.  Continue this as outpatient  Stage IV decubital ulcer: Present on admission.  Appreciate wound care help.  Receiving Santyl dressing plus saline moistened gauze then dry gauze or abdominal pad and changing daily.  Air mattress.  Acute kidney injury CKD (chronic kidney disease), stage IIIa: About the same.  In the context of decompensated heart failure.  Did better with Lasix.  On day of discharge, creatinine at 1.72 with a GFR of 41.  Consultants:  Palliative care  Procedures:  PICC line placement 12/26-12/30  Discharge Exam: BP 101/63 (BP Location: Left Arm)   Pulse 89   Temp 98.3 F (36.8 C) (Oral)   Resp 17   Ht 6\' 7"  (2.007 m)   Wt 98.4 kg   SpO2 100%   BMI 24.44 kg/m   General: Improved, being discharged back to skilled nursing Cardiovascular: Regular rate and rhythm, S1-S2 Respiratory: Few rales, mostly clear to auscultation bilaterally  Discharge Instructions You were cared for by a hospitalist during your hospital stay. If you have any questions about your discharge medications or the care you received while you were in the hospital after you are discharged, you can call the unit and asked to  speak with the hospitalist on call if the hospitalist that took care of you is not available. Once you are discharged, your primary care physician will handle any further medical issues. Please note that NO REFILLS for any discharge medications will be authorized once you are discharged, as it is imperative that you return to your primary care physician (or establish a relationship with a primary care physician if you do not have one) for your aftercare needs so that they can reassess your need for medications and monitor your lab values.  Discharge Instructions    Discharge wound care:   Complete by: As directed    Apply Santyl to coccyx wound in a nickel thick layer. Cover with a saline moistened gauze, then dry gauze or ABD pad.  Change daily.     Allergies as of 07/27/2020   No Known Allergies     Medication List    STOP taking these medications   HYDROcodone-acetaminophen 5-325 MG tablet Commonly known as: NORCO/VICODIN     TAKE these medications   acetaminophen 325 MG tablet Commonly known as: TYLENOL Take 2 tablets (650 mg total) by mouth every 6 (six) hours as needed for mild pain, moderate pain or headache.   amoxicillin-clavulanate 875-125 MG tablet Commonly known as: Augmentin Take 1 tablet by mouth 2 (two) times daily for 3 days.   apixaban 5 MG  Tabs tablet Commonly known as: ELIQUIS Take 5 mg by mouth 2 (two) times daily. What changed: Another medication with the same name was removed. Continue taking this medication, and follow the directions you see here.   atorvastatin 10 MG tablet Commonly known as: LIPITOR Take 10 mg by mouth daily.   collagenase ointment Commonly known as: SANTYL Apply topically daily. Start taking on: July 28, 2020   diltiazem 60 MG tablet Commonly known as: CARDIZEM Take 1 tablet (60 mg total) by mouth 3 (three) times daily.   feeding supplement Liqd Take 237 mLs by mouth 2 (two) times daily between meals.   furosemide 40 MG  tablet Commonly known as: LASIX Take 0.5 tablets (20 mg total) by mouth daily. fluid What changed: additional instructions   gabapentin 100 MG capsule Commonly known as: NEURONTIN Take 100 mg by mouth 3 (three) times daily.   insulin glargine 100 UNIT/ML injection Commonly known as: LANTUS Inject 0.07 mLs (7 Units total) into the skin daily. Start taking on: July 28, 2020 What changed: how much to take   levalbuterol 1.25 MG/0.5ML nebulizer solution Commonly known as: XOPENEX Take 0.63 mg by nebulization every 6 (six) hours as needed for wheezing or shortness of breath.   magnesium oxide 400 MG tablet Commonly known as: MAG-OX Take 400 mg by mouth daily.   metFORMIN 500 MG 24 hr tablet Commonly known as: GLUCOPHAGE-XR Take 500 mg by mouth at bedtime.   Multivitamin Adults Tabs Take 1 tablet by mouth daily.   ondansetron 4 MG tablet Commonly known as: ZOFRAN Take 4 mg by mouth every 8 (eight) hours as needed for nausea or vomiting.   OZEMPIC (1 MG/DOSE) [name] Inject 1 mg into the skin every Wednesday.   pantoprazole 40 MG tablet Commonly known as: PROTONIX Take 1 tablet (40 mg total) by mouth 2 (two) times daily.   PROSTAT PO Take 30 mLs by mouth 2 (two) times daily.   Vitamin D3 25 MCG (1000 UT) Caps Take 1,000 Units by mouth daily.            Discharge Care Instructions  (From admission, onward)         Start     Ordered   07/27/20 0000  Discharge wound care:       Comments: Apply Santyl to coccyx wound in a nickel thick layer. Cover with a saline moistened gauze, then dry gauze or ABD pad.  Change daily.   07/27/20 1437          No Known Allergies    The results of significant diagnostics from this hospitalization (including imaging, microbiology, ancillary and laboratory) are listed below for reference.    Significant Diagnostic Studies: DG Chest 1 View  Result Date: 07/22/2020 CLINICAL DATA:  Absent RIGHT-side breath sounds EXAM: CHEST   1 VIEW COMPARISON:  Portable exam 1631 hours compared to 07/03/2020 FINDINGS: Normal heart size, mediastinal contours, and pulmonary vascularity. Marked elevation of RIGHT diaphragm with bowel interposition. Atelectasis and/or infiltrate at RIGHT base. Remaining lungs clear. No pleural effusion or pneumothorax. Gaseous distention of stomach. IMPRESSION: Elevation of RIGHT diaphragm with atelectasis and/or infiltrate at RIGHT lung base. Gaseous distention of stomach. Electronically Signed   By: Lavonia Dana M.D.   On: 07/22/2020 16:37   DG Chest 2 View  Result Date: 07/04/2020 CLINICAL DATA:  Dyspnea on exertion. EXAM: CHEST - 2 VIEW COMPARISON:  May 15, 2020. FINDINGS: The heart size and mediastinal contours are within normal limits. Both lungs are  clear. No pneumothorax or pleural effusion is noted. The visualized skeletal structures are unremarkable. IMPRESSION: No active cardiopulmonary disease. Electronically Signed   By: Marijo Conception M.D.   On: 07/04/2020 15:36   CT Head Wo Contrast  Result Date: 07/22/2020 CLINICAL DATA:  Delirium; history diabetes mellitus, hypertension, prostate cancer EXAM: CT HEAD WITHOUT CONTRAST TECHNIQUE: Contiguous axial images were obtained from the base of the skull through the vertex without intravenous contrast. Sagittal and coronal MPR images reconstructed from axial data set. COMPARISON:  04/28/2020 FINDINGS: Brain: Generalized atrophy. Diffuse prominence of the ventricular system consistent with atrophy, unchanged. No midline shift or mass effect. Mild small vessel chronic ischemic changes of deep cerebral white matter. No intracranial hemorrhage, mass lesion, or evidence of acute infarction. Vascular: No hyperdense vessels Skull: Intact Sinuses/Orbits: Clear Other: N/A IMPRESSION: Atrophy with small vessel chronic ischemic changes of deep cerebral white matter. No acute intracranial abnormalities. Electronically Signed   By: Lavonia Dana M.D.   On: 07/22/2020  19:44   CT Angio Chest PE W and/or Wo Contrast  Result Date: 07/22/2020 CLINICAL DATA:  Shortness of breath, delirium, question pulmonary embolism EXAM: CT ANGIOGRAPHY CHEST WITH CONTRAST TECHNIQUE: Multidetector CT imaging of the chest was performed using the standard protocol during bolus administration of intravenous contrast. Multiplanar CT image reconstructions and MIPs were obtained to evaluate the vascular anatomy. CONTRAST:  88mL OMNIPAQUE IOHEXOL 350 MG/ML SOLN IV COMPARISON:  04/30/2020 FINDINGS: Cardiovascular: Atherosclerotic calcifications aorta and coronary arteries. Aorta normal caliber without aneurysm or dissection. Borderline for LEFT ventricular hypertrophy. No pericardial effusion. Pulmonary arteries adequately opacified. Lower lobe pulmonary artery branches are suboptimally assessed due to consolidation and beam hardening. No definite pulmonary emboli identified. Mediastinum/Nodes: Esophagus unremarkable. Base of cervical region normal appearance. No thoracic adenopathy. Lungs/Pleura: Significant elevation of RIGHT diaphragm with bowel interposition. Consolidation involving RIGHT lower lobe as well as posterior segment of RIGHT upper lobe consistent with pneumonia. Consolidation in medial LEFT lower lobe as well. Minimal lingular infiltrate. Small RIGHT pleural effusion. No pneumothorax. Upper Abdomen: Bowel interposition. Scattered foci of gas are seen under the RIGHT diaphragm which appear to be related to bowel loops. No definite free air. Visualized portion of liver unremarkable. Musculoskeletal: No acute osseous findings. Diffuse ossification of the interspinous ligament. Review of the MIP images confirms the above findings. IMPRESSION: No definite evidence of pulmonary embolism, with assessment of lower lobe pulmonary artery branches limited due to beam hardening. Significant elevation of RIGHT diaphragm with bowel interposition. BILATERAL lower lobe consolidation consistent with  pneumonia, greater on RIGHT. Small RIGHT pleural effusion. Aortic Atherosclerosis (ICD10-I70.0). Electronically Signed   By: Lavonia Dana M.D.   On: 07/22/2020 19:58   [place] EKG SITE RITE  Result Date: 07/23/2020 If Site Rite image not attached, placement could not be confirmed due to current cardiac rhythm.   Microbiology: Recent Results (from the past 240 hour(s))  Resp Panel by RT-PCR (Flu A&B, Covid) Nasopharyngeal Swab     Status: None   Collection Time: 07/22/20  4:37 PM   Specimen: Nasopharyngeal Swab; Nasopharyngeal(NP) swabs in vial transport medium  Result Value Ref Range Status   SARS Coronavirus 2 by RT PCR NEGATIVE NEGATIVE Final    Comment: (NOTE) SARS-CoV-2 target nucleic acids are NOT DETECTED.  The SARS-CoV-2 RNA is generally detectable in upper respiratory specimens during the acute phase of infection. The lowest concentration of SARS-CoV-2 viral copies this assay can detect is 138 copies/mL. A negative result does not preclude SARS-Cov-2 infection and should  not be used as the sole basis for treatment or other patient management decisions. A negative result may occur with  improper specimen collection/handling, submission of specimen other than nasopharyngeal swab, presence of viral mutation(s) within the areas targeted by this assay, and inadequate number of viral copies(<138 copies/mL). A negative result must be combined with clinical observations, patient history, and epidemiological information. The expected result is Negative.  Fact Sheet for Patients:  EntrepreneurPulse.com.au  Fact Sheet for Healthcare Providers:  IncredibleEmployment.be  This test is no t yet approved or cleared by the [place] FDA and  has been authorized for detection and/or diagnosis of SARS-CoV-2 by FDA under an Emergency Use Authorization (EUA). This EUA will remain  in effect (meaning this test can be used) for the duration of the COVID-19  declaration under Section 564(b)(1) of the Act, 21 U.S.C.section 360bbb-3(b)(1), unless the authorization is terminated  or revoked sooner.       Influenza A by PCR NEGATIVE NEGATIVE Final   Influenza B by PCR NEGATIVE NEGATIVE Final    Comment: (NOTE) The Xpert Xpress SARS-CoV-2/FLU/RSV plus assay is intended as an aid in the diagnosis of influenza from Nasopharyngeal swab specimens and should not be used as a sole basis for treatment. Nasal washings and aspirates are unacceptable for Xpert Xpress SARS-CoV-2/FLU/RSV testing.  Fact Sheet for Patients: EntrepreneurPulse.com.au  Fact Sheet for Healthcare Providers: IncredibleEmployment.be  This test is not yet approved or cleared by the [place] FDA and has been authorized for detection and/or diagnosis of SARS-CoV-2 by FDA under an Emergency Use Authorization (EUA). This EUA will remain in effect (meaning this test can be used) for the duration of the COVID-19 declaration under Section 564(b)(1) of the Act, 21 U.S.C. section 360bbb-3(b)(1), unless the authorization is terminated or revoked.  Performed at [facility], [place] [address] [place], [place] [address]   Culture, blood (Routine X 2) w Reflex to ID Panel     Status: None   Collection Time: 07/22/20  9:20 PM   Specimen: BLOOD LEFT HAND  Result Value Ref Range Status   Specimen Description BLOOD LEFT HAND  Final   Special Requests   Final    BOTTLES DRAWN AEROBIC AND ANAEROBIC Blood Culture adequate volume   Culture   Final    NO GROWTH 5 DAYS Performed at [facility], [place] [address] [place], [place] [address]    Report Status 07/27/2020 FINAL  Final  Culture, blood (Routine X 2) w Reflex to ID Panel     Status: None   Collection Time: 07/22/20  9:20 PM   Specimen: BLOOD RIGHT HAND  Result Value Ref Range Status   Specimen Description BLOOD RIGHT HAND  Final   Special Requests   Final    BOTTLES DRAWN AEROBIC  AND ANAEROBIC Blood Culture adequate volume   Culture   Final    NO GROWTH 5 DAYS Performed at [facility], [place] [address], [place],  [address]    Report Status 07/27/2020 FINAL  Final  MRSA PCR Screening     Status: None   Collection Time: 07/23/20 12:00 AM   Specimen: Nasopharyngeal  Result Value Ref Range Status   MRSA by PCR NEGATIVE NEGATIVE Final    Comment:        The GeneXpert MRSA Assay (FDA approved for NASAL specimens only), is one component of a comprehensive MRSA colonization surveillance program. It is not intended to diagnose MRSA infection nor to guide or  monitor treatment for MRSA infections. Performed at [facility], [address] [place], [place] [address]      Labs: Basic Metabolic Panel: Recent Labs  Lab 07/23/20 0104 07/24/20 0255 07/25/20 0036 07/26/20 0056 07/27/20 0302  NA 138 136 138 138 139  K 4.8 5.0 5.1 4.9 4.8  CL 95* 92* 95* 97* 97*  CO2 33* 35* 35* 35* 35*  GLUCOSE 211* 158* 229* 133* 189*  BUN 29* 30* 34* 34* 34*  CREATININE 1.37* 1.48* 1.87* 1.82* 1.72*  CALCIUM 8.5* 8.4* 8.3* 8.1* 8.5*  MG 1.8  --   --   --   --    Liver Function Tests: Recent Labs  Lab 07/22/20 1628 07/24/20 0255 07/27/20 0302  AST 36 20 20  ALT 45* 35 30  ALKPHOS 116 105 75  BILITOT 0.5 0.5 0.6  PROT 5.7* 5.5* 5.1*  ALBUMIN 2.4* 2.2* 1.9*   No results for input(s): LIPASE, AMYLASE in the last 168 hours. No results for input(s): AMMONIA in the last 168 hours. CBC: Recent Labs  Lab 07/22/20 1628 07/22/20 1707 07/23/20 0104 07/24/20 0255 07/25/20 0036 07/26/20 0056 07/27/20 0302  WBC 11.1*  --  16.5* 9.5 8.7 7.8 7.9  NEUTROABS 6.5  --   --   --   --   --   --   HGB 10.9*   < > 10.5* 9.7* 9.1* 8.2* 8.7*  HCT 38.1*   < > 35.3* 34.3* 30.8* 27.6* 30.3*  MCV 94.5  --  94.6 95.8 92.2 91.4 94.1  PLT 401*  --  395 338 309 264 265   < > = values in this interval not displayed.   Cardiac Enzymes: No results for input(s):  CKTOTAL, CKMB, CKMBINDEX, TROPONINI in the last 168 hours. BNP: BNP (last 3 results) Recent Labs    10/Jay/21 1945 07/22/20 1628 07/25/20 1310  BNP 140.6* 222.2* 354.4*    ProBNP (last 3 results) No results for input(s): PROBNP in the last 8760 hours.  CBG: Recent Labs  Lab 07/26/20 1101 07/26/20 1603 07/26/20 2109 07/27/20 0601 07/27/20 1130  GLUCAP 114* 123* 143* 170* 126*       Signed:  Hollice Espy, MD [facility] 07/27/2020, 2:38 PM

## 2020-07-27 NOTE — Progress Notes (Signed)
Patient had an approx 50 beat run of V-tach at 1620, VS T 98.2, BP 93/62, HR 93, R 16, 100% on RA. Dr. Rito Ehrlich paged and ordered stat BMP and Magnesium labs. RN will continue to monitor.

## 2020-07-27 NOTE — Progress Notes (Signed)
Had 50 bt run of VT.  Labs checked.  Asymptomatic.  Likely form underlying resp issues plus diuresis.  K+ and Mg+ ok.  Will watch overnight and D/C in morning to SNF.

## 2020-07-28 DIAGNOSIS — J690 Pneumonitis due to inhalation of food and vomit: Secondary | ICD-10-CM | POA: Diagnosis not present

## 2020-07-28 DIAGNOSIS — J9601 Acute respiratory failure with hypoxia: Secondary | ICD-10-CM | POA: Diagnosis not present

## 2020-07-28 DIAGNOSIS — N1832 Chronic kidney disease, stage 3b: Secondary | ICD-10-CM | POA: Diagnosis not present

## 2020-07-28 DIAGNOSIS — E43 Unspecified severe protein-calorie malnutrition: Secondary | ICD-10-CM | POA: Diagnosis not present

## 2020-07-28 LAB — BASIC METABOLIC PANEL
Anion gap: 9 (ref 5–15)
BUN: 27 mg/dL — ABNORMAL HIGH (ref 8–23)
CO2: 37 mmol/L — ABNORMAL HIGH (ref 22–32)
Calcium: 8.5 mg/dL — ABNORMAL LOW (ref 8.9–10.3)
Chloride: 95 mmol/L — ABNORMAL LOW (ref 98–111)
Creatinine, Ser: 1.48 mg/dL — ABNORMAL HIGH (ref 0.61–1.24)
GFR, Estimated: 50 mL/min — ABNORMAL LOW (ref >60)
Glucose, Bld: 240 mg/dL — ABNORMAL HIGH (ref 70–99)
Potassium: 4.7 mmol/L (ref 3.5–5.1)
Sodium: 141 mmol/L (ref 135–145)

## 2020-07-28 LAB — GLUCOSE, CAPILLARY
Glucose-Capillary: 114 mg/dL — ABNORMAL HIGH (ref 70–99)
Glucose-Capillary: 179 mg/dL — ABNORMAL HIGH (ref 70–99)
Glucose-Capillary: 220 mg/dL — ABNORMAL HIGH (ref 70–99)
Glucose-Capillary: 215 mg/dL — ABNORMAL HIGH (ref 70–99)

## 2020-07-28 LAB — MAGNESIUM: Magnesium: 1.8 mg/dL (ref 1.7–2.4)

## 2020-07-28 NOTE — Evaluation (Addendum)
Physical Therapy Evaluation Patient Details Name: Jay Holloway MRN: [id number] DOB: 04-Apr-1946 Today's Date: 07/28/2020   History of Present Illness  74yo male who became unresponsive at his SNF, required 5 minutes of CPR. Hypoxic in the ED and needed NRB. CTH negative, PE negative. Admitted with acute respiratory failure with hypoxia/hypercapnia, sepsis secondary to aspiration pneumonia vs HCAP. PMH DM, HLD, HTN, prostate CA  Clinical Impression   Patient received in bed watching TV, reports he is going back to his SNF today. MMT in [place] 3+ to 4-/5 grossly, but BLE strength very poor and no more than 2/5 at best. Needed totalAx1 for even partial rolling and nursing staff reports he has needed [place] for rolling/pericare. Per chart and what information he is able to provide, sounds like he has been total care at baseline at the SNF for some time now and has been largely bed bound recently. Seems like he is likely at his baseline. Left in bed with all needs met, bed alarm active, and NT aware of patient mobility. PT signing off, agree with hospice f/u moving forward. Thank you for the opportunity to participate in his care!     Follow Up Recommendations Other (comment);SNF (hospice care)    Equipment Recommendations  Hospital bed;Other (comment) (hoyer lift)    Recommendations for Other Services       Precautions / Restrictions Precautions Precautions: Fall Restrictions Weight Bearing Restrictions: No      Mobility  Bed Mobility Overal bed mobility: Needs Assistance Bed Mobility: Rolling Rolling: Total assist         General bed mobility comments: totalA for partial rolling and max cues for hand placement/sequencing. Very little lnitation or effort. RN reports he has been [place] for rolling/pericare with nursing staff    Transfers                 General transfer comment: unable  Ambulation/Gait             General Gait Details: unable  Stairs             Wheelchair Mobility    Modified Rankin (Stroke Patients Only)       Balance                                             Pertinent Vitals/Pain Pain Assessment: Faces Faces Pain Scale: No hurt Pain Intervention(s): Limited activity within patient's tolerance;Monitored during session    [place] expects to be discharged to:: Skilled nursing facility                      Prior Function Level of Independence: Needs assistance   Gait / Transfers Assistance Needed: per chart and patient, he is bed bound and really does not get up out of bed at this point  ADL's / Homemaking Assistance Needed: totalA for ADLs/IADLs per chart and patient        Hand Dominance        Extremity/Trunk Assessment   Upper Extremity Assessment Upper Extremity Assessment: Generalized weakness    Lower Extremity Assessment Lower Extremity Assessment: Generalized weakness    Cervical / Trunk Assessment Cervical / Trunk Assessment: Kyphotic  Communication   Communication: No difficulties  Cognition Arousal/Alertness: Awake/alert Behavior During Therapy: Flat affect Overall Cognitive Status: Impaired/Different from baseline Area of Impairment: Orientation;Attention;Memory;Following commands;Safety/judgement;Awareness;Problem solving  Orientation Level: Disoriented to;Place;Time;Situation (tells me his birthday is [birth year] when it is really in [birth year]) Current Attention Level: Sustained Memory: Decreased short-term memory Following Commands: Follows one step commands with increased time;Follows one step commands inconsistently Safety/Judgement: Decreased awareness of safety;Decreased awareness of deficits Awareness: Intellectual Problem Solving: Slow processing;Decreased initiation;Difficulty sequencing;Requires verbal cues General Comments: slow processing and definite STM deficits. With max cues, able to figure out he is  in the hospital but then corrects himself stating "No I'm not in the hospital, I'm at my friend's house".      General Comments General comments (skin integrity, edema, etc.): unable to get to EOB/OOB- really at his baseline per chart and what PLOF info he can provide    Exercises     Assessment/Plan    PT Assessment [place] does not need any further PT services  PT Problem List         PT Treatment Interventions      PT Goals (Current goals can be found in the Care Plan section)  Acute Rehab PT Goals Patient Stated Goal: go back to his SNF today PT Goal Formulation: With patient Time For Goal Achievement: 08/11/20 Potential to Achieve Goals: Good    Frequency     Barriers to discharge        Co-evaluation               AM-PAC PT "6 Clicks" Mobility  Outcome Measure Help needed turning from your back to your side while in a flat bed without using bedrails?: Total Help needed moving from lying on your back to sitting on the side of a flat bed without using bedrails?: Total Help needed moving to and from a bed to a chair (including a wheelchair)?: Total Help needed standing up from a chair using your arms (e.g., wheelchair or bedside chair)?: Total Help needed to walk in hospital room?: Total Help needed climbing 3-5 steps with a railing? : Total 6 Click Score: 6    End of Session Equipment Utilized During Treatment: Oxygen Activity Tolerance: Patient tolerated treatment well Patient left: in bed;with call bell/phone within reach;with bed alarm set Nurse Communication: Mobility status PT Visit Diagnosis: Muscle weakness (generalized) (M62.81);Other abnormalities of gait and mobility (R26.89);Adult, failure to thrive (R62.7)    Time: 1100-1109 PT Time Calculation (min) (ACUTE ONLY): 9 min   Charges:   PT Evaluation $PT Eval Moderate Complexity: 1 Mod          Windell Norfolk, DPT, PN1   Supplemental Physical Therapist [place]    Pager  [phone] Acute Rehab Office [phone]

## 2020-07-28 NOTE — Progress Notes (Signed)
CSW spoke with Olegario Messier at [facility]. Auth still pending as of this A.M.

## 2020-07-28 NOTE — Progress Notes (Signed)
Spoke with [facility] and attending physician.  Patient will return to [facility] with Palliative. Made patient's nephew and decision making surrogate aware that he would not be followed by Hospice right away, rather he will be followed by Palliative.  Recommended that he be shifted over to Hospice services at the earliest opportunity.  Discussed same with [facility] Palliative rep Dierdre Harness, NP.  Norvel Richards, PA-C Palliative Medicine Office:  [phone]

## 2020-07-28 NOTE — Evaluation (Signed)
Occupational Therapy Evaluation Patient Details Name: Jay Holloway MRN: [id number] DOB: 11-22-1945 Today's Date: 07/28/2020    History of Present Illness 74yo male who became unresponsive at his SNF, required 5 minutes of CPR. Hypoxic in the ED and needed NRB. CTH negative, PE negative. Admitted with acute respiratory failure with hypoxia/hypercapnia, sepsis secondary to aspiration pneumonia vs HCAP. PMH DM, HLD, HTN, prostate CA   Clinical Impression   PTA, pt was at SNF and required Max-Total A for ADLs and lift for transfer OOB. Pt currently requiring Max-Total A for ADLs and Total A for bed mobility. Pt presenting with decreased cognition, strength, and activity tolerance. VSS on 3L.  Recommend dc to SNF for further OT to optimize safety, independence with ADLs, and return to PLOF. Defer further OT needs to next venue as pt is close to baseline.    Follow Up Recommendations  SNF    Equipment Recommendations  None recommended by OT    Recommendations for Other Services       Precautions / Restrictions Precautions Precautions: Fall Restrictions Weight Bearing Restrictions: No      Mobility Bed Mobility Overal bed mobility: Needs Assistance Bed Mobility: Rolling Rolling: Total assist         General bed mobility comments: Total A for repositioning in bed    Transfers                 General transfer comment: Defer    Balance                                           ADL either performed or assessed with clinical judgement   ADL Overall ADL's : Needs assistance/impaired Eating/Feeding: Set up;Bed level   Grooming: Wash/dry face;Supervision/safety;Set up;Bed level   Upper Body Bathing: Maximal assistance;Bed level   Lower Body Bathing: Total assistance;Bed level   Upper Body Dressing : Maximal assistance;Bed level   Lower Body Dressing: Total assistance;Bed level               Functional mobility during ADLs: Total  assistance General ADL Comments: Total A for ADLs and bed mobility     Vision         Perception     Praxis      Pertinent Vitals/Pain Pain Assessment: Faces Faces Pain Scale: Hurts little more Pain Location: "My feet hurt" Pain Descriptors / Indicators: Discomfort;Cramping Pain Intervention(s): Monitored during session;Limited activity within patient's tolerance;Repositioned     Hand Dominance Right   Extremity/Trunk Assessment Upper Extremity Assessment Upper Extremity Assessment: Generalized weakness   Lower Extremity Assessment Lower Extremity Assessment: Defer to PT evaluation   Cervical / Trunk Assessment Cervical / Trunk Assessment: Kyphotic   Communication Communication Communication: No difficulties   Cognition Arousal/Alertness: Awake/alert Behavior During Therapy: Flat affect Overall Cognitive Status: Impaired/Different from baseline Area of Impairment: Orientation;Attention;Memory;Following commands;Safety/judgement;Awareness;Problem solving                 Orientation Level: Disoriented to;Place;Time;Situation Current Attention Level: Sustained Memory: Decreased short-term memory Following Commands: Follows one step commands with increased time;Follows one step commands inconsistently Safety/Judgement: Decreased awareness of safety;Decreased awareness of deficits Awareness: Intellectual Problem Solving: Slow processing;Decreased initiation;Difficulty sequencing;Requires verbal cues General Comments: slow processing and definite STM deficits. With max cues, able to figure out he is in the hospital but then corrects himself stating "No I'm not in the hospital, I'm  at my friend's house".   General Comments  VSS on 3L    Exercises Exercises: General Upper Extremity;General Lower Extremity General Exercises - Upper Extremity Shoulder Flexion: AROM;10 reps;Both;Supine Elbow Flexion: AROM;Both;10 reps;Supine Elbow Extension: AROM;Both;10  reps;Supine General Exercises - Lower Extremity Ankle Circles/Pumps: AROM;Both;5 reps;Supine Short Arc Quad: Both;5 reps;Supine;PROM Hip ABduction/ADduction: PROM;Both;5 reps;Supine Hip Flexion/Marching: PROM;Both;5 reps;Supine   Shoulder Instructions      Home Living Family/patient expects to be discharged to:: Skilled nursing facility                                        Prior Functioning/Environment Level of Independence: Needs assistance  Gait / Transfers Assistance Needed: per chart and patient, he is bed bound and really does not get up out of bed at this point ADL's / Homemaking Assistance Needed: totalA for ADLs/IADLs per chart and patient            OT Problem List: Decreased strength;Decreased activity tolerance;Decreased range of motion;Impaired balance (sitting and/or standing);Decreased safety awareness;Decreased knowledge of use of DME or AE;Decreased knowledge of precautions;Decreased cognition;Pain;Increased edema      OT Treatment/Interventions:      OT Goals(Current goals can be found in the care plan section) Acute Rehab OT Goals Patient Stated Goal: Return to SNF OT Goal Formulation: All assessment and education complete, DC therapy  OT Frequency:     Barriers to D/C:            Co-evaluation              AM-PAC OT "6 Clicks" Daily Activity     Outcome Measure Help from another person eating meals?: A Little Help from another person taking care of personal grooming?: A Little Help from another person toileting, which includes using toliet, bedpan, or urinal?: Total Help from another person bathing (including washing, rinsing, drying)?: Total Help from another person to put on and taking off regular upper body clothing?: Total Help from another person to put on and taking off regular lower body clothing?: Total 6 Click Score: 10   End of Session Equipment Utilized During Treatment: Oxygen Nurse Communication: Mobility  status  Activity Tolerance: Patient tolerated treatment well Patient left: in bed;with call bell/phone within reach;with nursing/sitter in room  OT Visit Diagnosis: Unsteadiness on feet (R26.81);Other abnormalities of gait and mobility (R26.89);Muscle weakness (generalized) (M62.81);Pain Pain - Right/Left:  (Bil) Pain - part of body: Leg                Time: [id number] OT Time Calculation (min): 14 min Charges:  OT General Charges $OT Visit: 1 Visit OT Evaluation $OT Eval Moderate Complexity: [place], OTR/L Acute Rehab Pager: [phone] Office: [place] 07/28/2020, 1:34 PM

## 2020-07-28 NOTE — NC FL2 (Signed)
[place] LEVEL OF CARE SCREENING TOOL     IDENTIFICATION  Patient Name: Jay Holloway Birthdate: [birth year] Sex: male Admission Date (Current Location): 07/22/2020  [facility] and [place] Number:  Herbalist and Address:         Provider Number: [id number]  Attending Physician Name and Address:  Annita Brod, MD  Relative Name and Phone Number:  Holley Bouche Denman [place])   [phone] [facility])    Current Level of Care: Hospital Recommended Level of Care: [place] Prior Approval Number: [id number]  Date Approved/Denied:   PASRR Number: [id number]  Discharge Plan: SNF    Current Diagnoses: Patient Active Problem List   Diagnosis Date Noted  . VT (ventricular tachycardia) ([place])   . Palliative care encounter   . Severe protein-calorie malnutrition ([place]) 07/23/2020  . HCAP (healthcare-associated pneumonia) 07/22/2020  . Chronic diastolic CHF (congestive heart failure) ([place]) 07/22/2020  . Type II diabetes mellitus with renal manifestations ([place]) 07/22/2020  . Sepsis ([place]) 07/22/2020  . Decubital ulcer 07/22/2020  . Unresponsiveness 07/22/2020  . HLD (hyperlipidemia) 07/22/2020  . CKD (chronic kidney disease), stage IIIa 07/22/2020  . Aspiration pneumonia ([place]) 07/22/2020  . PE (pulmonary thromboembolism) ([place]) 07/22/2020  . VTE (venous thromboembolism) 07/03/2020  . At risk for obstructive sleep apnea 07/03/2020  . Former smoker 07/03/2020  . Shortness of breath 07/03/2020  . Acute respiratory failure with hypoxia and hypercapnia ([place]) 05/10/2020  . Acute respiratory failure ([place]) 05/10/2020  . Hypomagnesemia 04/28/2020  . Mixed diabetic hyperlipidemia associated with type 2 diabetes mellitus ([place]) 04/28/2020  . Pressure injury of sacral region, stage 2 ([place]) 04/19/2020  . Acute diastolic CHF (congestive heart failure) ([place]) 04/19/2020  . Acute respiratory failure with hypoxia and hypercarbia ([place]) 04/17/2020  .  Hypernatremia 04/17/2020  . Dysphagia 04/17/2020  . Acute metabolic encephalopathy [id number]  . Hyperkalemia 04/14/2020  . GERD without esophagitis 04/14/2020  . Slurred speech 04/14/2020  . Generalized weakness 04/14/2020  . AKI (acute kidney injury) ([place]) 03/21/2020  . Prostate cancer ([place])   . Essential hypertension   . Arterial hypotension   . Renal failure   . Type 2 diabetes mellitus with hyperglycemia, with long-term current use of insulin ([place])   . Weakness   . DKA (diabetic ketoacidoses) 11/05/2014    Orientation RESPIRATION BLADDER Height & Weight     Self  O2 (3l [place]) External catheter Weight: 216 lb 14.9 oz (98.4 kg) Height:  6\' 7"  (200.7 cm)  BEHAVIORAL SYMPTOMS/MOOD NEUROLOGICAL BOWEL NUTRITION STATUS      Continent Diet (see d/c summary)  AMBULATORY STATUS COMMUNICATION OF NEEDS Skin   Extensive Assist Verbally PU Stage and Appropriate Care                       Personal Care Assistance Level of Assistance  Bathing,Feeding,Dressing Bathing Assistance: Maximum assistance Feeding assistance: Limited assistance Dressing Assistance: Maximum assistance     Functional Limitations Info  Sight,Hearing,Speech Sight Info: Adequate Hearing Info: Adequate Speech Info: Adequate    SPECIAL CARE FACTORS FREQUENCY  PT (By licensed PT),OT (By licensed OT)     PT Frequency: 5x/week OT Frequency: 5x/week            Contractures Contractures Info: Not present    Additional Factors Info  Code Status,Allergies Code Status Info: Partial Allergies Info: No known allergies           Current Medications (07/28/2020):  This is the current hospital active medication list Current  Facility-Administered Medications  Medication Dose Route Frequency Provider Last Rate Last Admin  . (feeding supplement) PROSource Plus liquid 30 mL  30 mL Oral BID BM Annita Brod, MD   30 mL at 07/28/20 0858  . acetaminophen (TYLENOL) tablet 650 mg  650 mg Oral Q6H PRN Ivor Costa, MD      . albuterol (PROVENTIL) (2.5 MG/3ML) 0.083% nebulizer solution 2.5 mg  2.5 mg Nebulization Q4H PRN Ivor Costa, MD      . apixaban (ELIQUIS) tablet 5 mg  5 mg Oral BID Annita Brod, MD   5 mg at 07/28/20 0857  . atorvastatin (LIPITOR) tablet 10 mg  10 mg Oral Daily Ivor Costa, MD   10 mg at 07/28/20 0858  . Chlorhexidine Gluconate Cloth 2 % PADS 6 each  6 each Topical Daily Annita Brod, MD   6 each at 07/27/20 [phone]  . cholecalciferol (VITAMIN D3) tablet 1,000 Units  1,000 Units Oral Daily Ivor Costa, MD   1,000 Units at 07/28/20 0857  . collagenase (SANTYL) ointment   Topical Daily Annita Brod, MD   Given at 07/27/20 [phone]  . dextromethorphan-guaiFENesin (MUCINEX DM) 30-600 MG per 12 hr tablet 1 tablet  1 tablet Oral BID PRN Ivor Costa, MD      . diltiazem (CARDIZEM) tablet 60 mg  60 mg Oral Q8H Annita Brod, MD   60 mg at 07/28/20 1238  . feeding supplement (ENSURE ENLIVE / ENSURE PLUS) liquid 237 mL  237 mL Oral TID BM Annita Brod, MD   237 mL at 07/28/20 1012  . furosemide (LASIX) injection 20 mg  20 mg Intravenous BID Annita Brod, MD   20 mg at 07/28/20 0857  . gabapentin (NEURONTIN) capsule 100 mg  100 mg Oral TID Ivor Costa, MD   100 mg at 07/28/20 0857  . hydrALAZINE (APRESOLINE) injection 5 mg  5 mg Intravenous Q2H PRN Ivor Costa, MD      . insulin aspart (novoLOG) injection 0-5 Units  0-5 Units Subcutaneous QHS Ivor Costa, MD      . insulin aspart (novoLOG) injection 0-9 Units  0-9 Units Subcutaneous TID WC Ivor Costa, MD   2 Units at 07/28/20 1235  . insulin glargine (LANTUS) injection 7 Units  7 Units Subcutaneous Daily Ivor Costa, MD   7 Units at 07/28/20 [phone]  . magnesium oxide (MAG-OX) tablet 400 mg  400 mg Oral Daily Ivor Costa, MD   400 mg at 07/28/20 0857  . multivitamin with minerals tablet 1 tablet  1 tablet Oral Daily Ivor Costa, MD   1 tablet at 07/28/20 0857  . ondansetron (ZOFRAN) injection 4 mg  4 mg Intravenous Q8H PRN  Ivor Costa, MD      . pantoprazole (PROTONIX) EC tablet 40 mg  40 mg Oral BID Ivor Costa, MD   40 mg at 07/28/20 0858  . piperacillin-tazobactam (ZOSYN) IVPB 3.375 g  3.375 g Intravenous Q8H Bertis Ruddy, RPH 12.5 mL/hr at 07/28/20 1013 3.375 g at 07/28/20 1013  . sodium chloride flush (NS) 0.9 % injection 10-40 mL  10-40 mL Intracatheter Q12H Annita Brod, MD   10 mL at 07/28/20 1035  . sodium chloride flush (NS) 0.9 % injection 10-40 mL  10-40 mL Intracatheter PRN Annita Brod, MD         Discharge Medications: Please see discharge summary for a list of discharge medications.  Relevant Imaging Results:  Relevant Lab Results:   Additional Information  [id number]  Erin Sons, LCSW

## 2020-07-28 NOTE — Discharge Summary (Addendum)
Discharge Summary  Jay Holloway [id number] DOB: 1946/03/17  PCP: [name], MD  Admit date: 07/22/2020 Anticipated discharge date: 07/28/2020  Time spent: 25 minutes  Recommendations for Outpatient Follow-up:  1. New medication: Augmentin 875 p.o. twice daily x3 days 2. Diet adjustment: Dysphagia 3 diet 3. Patient will be resuming palliative care services after returning back to his skilled nursing facility at [facility] 4. Clarification from med rec: Patient will continue Eliquis 5 mg p.o. twice daily 5. Medication change: Percocet discontinued  Discharge Diagnoses:  Active Hospital Problems   Diagnosis Date Noted  . Sepsis ([place]) 07/22/2020  . VT (ventricular tachycardia) ([place])   . Palliative care encounter   . Severe protein-calorie malnutrition ([place]) 07/23/2020  . HCAP (healthcare-associated pneumonia) 07/22/2020  . Chronic diastolic CHF (congestive heart failure) ([place]) 07/22/2020  . Type II diabetes mellitus with renal manifestations ([place]) 07/22/2020  . Decubital ulcer 07/22/2020  . Unresponsiveness 07/22/2020  . HLD (hyperlipidemia) 07/22/2020  . CKD (chronic kidney disease), stage IIIa 07/22/2020  . Aspiration pneumonia ([place]) 07/22/2020  . Acute respiratory failure with hypoxia and hypercapnia ([place]) 05/10/2020  . GERD without esophagitis 04/14/2020  . Essential hypertension     Resolved Hospital Problems  No resolved problems to display.    Discharge Condition: Improved, being discharged back to skilled nursing  Diet recommendation: Dysphagia 3 diet with thin liquids.  Continue multivitamin plus Ensure 3 times daily plus Prosource 3 times daily  Vitals:   07/28/20 0500 07/28/20 0600  BP:    Pulse: 93 88  Resp: 15 19  Temp:    SpO2: 95% 95%    History of present illness:  74 year old male with past medical history of hypertension, diabetes mellitus, diastolic CHF, DVT/PE on Eliquis and stage IIIa chronic kidney disease presented from a skilled  nursing facility after an episode of unresponsiveness.  There was a possible cardiac arrest at his nursing facility as they were unable to palpate a pulse and patient went 5 minutes CPR but upon arrival of EMS, patient was awake and with pulses.  Looks to be somewhat disoriented.  Oxygen saturations are 83%.  Brought into the emergency room and found to have sepsis secondary to pneumonia aspiration pneumonia with acute respiratory failure.  Admitted to the hospitalist service.   Hospital Course:  Severe sepsis [facility]) present on admission from aspiration pneumonia from dysphagia causing acute respiratory failure with hypoxia and hypercapnia: Patient meets criteria given respiratory failure, pneumonia source and SIRS criteria.  Sepsis itself has resolved.  White blood cell count normal.  Respiratory failure issues have transitioned from aspiration pneumonia to heart failure now as etiology.  His long-term aspiration risks remain high.  He has had previous admissions for dysphagia and is at high risk for recurrent aspiration.  Seen by speech therapy and placed on dysphagia 3 although this will not be complete.  Seen by palliative care and discussion with patient and his nephew done. Discharged on dysphagia 3 diet.  After extensive discussions with palliative care, it was decided that hospice will follow patient when he returns back to skilled nursing.  Now partial code. Active Problems:   Essential hypertension: Blood pressures remain soft requiring fluid boluses   GERD without esophagitis  Acute on chronic diastolic CHF (congestive heart failure) (HCC) mild elevation of BNP.  Initially nothing seen on chest x-ray or CT in form of fluid.  With aggressive fluid resuscitation, he became volume overloaded IV fluids stopped and he was started on IV Lasix.  He has  since diuresed over 4 L of fluid.  Oxygenation much improved.  Senile dementia without behavioral disturbance: Mild, and patient is interactive.  Major  medical decision should go through his nephew.  We also stopped his Percocet which made the patient much more alert during his hospitalization.  He has not requested Percocet since it was discontinued and he seems to be much more alert without it.  Severe protein calorie malnutrition: Patient meets criteria in the context of chronic illness including his congestive heart failure and this is evidenced by his severe muscle and fat depletion.  Seen by nutrition.  Put on Ensure 3 times daily, Magic cup 3 times daily, Prosource twice daily plus multivitamin.  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  CBGs mildly elevated in the context of his acute illness.  A1c done 2-1/2 months ago elevated at 7.8.  Improved somewhat today.  Continue this as outpatient  Stage IV decubital ulcer: Present on admission.  Appreciate wound care help.  Receiving Santyl dressing plus saline moistened gauze then dry gauze or abdominal pad and changing daily.  Air mattress.  Acute kidney injury CKD (chronic kidney disease), stage IIIa: About the same.  In the context of decompensated heart failure.  Did better with Lasix.  On 12/30, creatinine at 1.62 with a GFR of 45.  Episode of ventricular tachycardia: Patient had a 50 beat run of VT on 12/30.  Electrolytes were immediately checked and patient found to have normal potassium and magnesium.  Patient himself asymptomatic.  Monitored and no further events.  Consultants:  Palliative care  Procedures:  PICC line placement 12/26-12/30  Discharge Exam: BP 124/64   Pulse 88   Temp 98.7 F (37.1 C) (Oral)   Resp 19   Ht 6\' 7"  (2.007 m)   Wt 98.4 kg   SpO2 95%   BMI 24.44 kg/m   General: Improved, being discharged back to skilled nursing Cardiovascular: Regular rate and rhythm, S1-S2 Respiratory: Few rales, mostly clear to auscultation  bilaterally  Discharge Instructions You were cared for by a hospitalist during your hospital stay. If you have any questions about your discharge medications or the care you received while you were in the hospital after you are discharged, you can call the unit and asked to speak with the hospitalist on call if the hospitalist that took care of you is not available. Once you are discharged, your primary care physician will handle any further medical issues. Please note that NO REFILLS for any discharge medications will be authorized once you are discharged, as it is imperative that you return to your primary care physician (or establish a relationship with a primary care physician if you do not have one) for your aftercare needs so that they can reassess your need for medications and monitor your lab values.  Discharge Instructions    Discharge wound care:   Complete by: As directed    Apply Santyl to coccyx wound in a nickel thick layer. Cover with a saline moistened gauze, then dry gauze or ABD pad.  Change daily.     Allergies as of 07/28/2020   No Known Allergies     Medication List    STOP taking these medications   HYDROcodone-acetaminophen 5-325 MG tablet Commonly known as: NORCO/VICODIN     TAKE these medications   acetaminophen 325 MG tablet Commonly known as: TYLENOL Take 2 tablets (650 mg total) by  mouth every 6 (six) hours as needed for mild pain, moderate pain or headache.   amoxicillin-clavulanate 875-125 MG tablet Commonly known as: Augmentin Take 1 tablet by mouth 2 (two) times daily for 3 days.   apixaban 5 MG Tabs tablet Commonly known as: ELIQUIS Take 5 mg by mouth 2 (two) times daily. What changed: Another medication with the same name was removed. Continue taking this medication, and follow the directions you see here.   atorvastatin 10 MG tablet Commonly known as: LIPITOR Take 10 mg by mouth daily.   collagenase ointment Commonly known as: SANTYL Apply  topically daily.   diltiazem 60 MG tablet Commonly known as: CARDIZEM Take 1 tablet (60 mg total) by mouth 3 (three) times daily.   feeding supplement Liqd Take 237 mLs by mouth 2 (two) times daily between meals.   furosemide 40 MG tablet Commonly known as: LASIX Take 0.5 tablets (20 mg total) by mouth daily. fluid What changed: additional instructions   gabapentin 100 MG capsule Commonly known as: NEURONTIN Take 100 mg by mouth 3 (three) times daily.   insulin glargine 100 UNIT/ML injection Commonly known as: LANTUS Inject 0.07 mLs (7 Units total) into the skin daily. What changed: how much to take   levalbuterol 1.25 MG/0.5ML nebulizer solution Commonly known as: XOPENEX Take 0.63 mg by nebulization every 6 (six) hours as needed for wheezing or shortness of breath.   magnesium oxide 400 MG tablet Commonly known as: MAG-OX Take 400 mg by mouth daily.   metFORMIN 500 MG 24 hr tablet Commonly known as: GLUCOPHAGE-XR Take 500 mg by mouth at bedtime.   Multivitamin Adults Tabs Take 1 tablet by mouth daily.   ondansetron 4 MG tablet Commonly known as: ZOFRAN Take 4 mg by mouth every 8 (eight) hours as needed for nausea or vomiting.   OZEMPIC (1 MG/DOSE) [place] Inject 1 mg into the skin every Wednesday.   pantoprazole 40 MG tablet Commonly known as: PROTONIX Take 1 tablet (40 mg total) by mouth 2 (two) times daily.   PROSTAT PO Take 30 mLs by mouth 2 (two) times daily.   Vitamin D3 25 MCG (1000 UT) Caps Take 1,000 Units by mouth daily.            Discharge Care Instructions  (From admission, onward)         Start     Ordered   07/27/20 0000  Discharge wound care:       Comments: Apply Santyl to coccyx wound in a nickel thick layer. Cover with a saline moistened gauze, then dry gauze or ABD pad.  Change daily.   07/27/20 1437          No Known Allergies    The results of significant diagnostics from this hospitalization (including imaging,  microbiology, ancillary and laboratory) are listed below for reference.    Significant Diagnostic Studies: DG Chest 1 View  Result Date: 07/22/2020 CLINICAL DATA:  Absent RIGHT-side breath sounds EXAM: CHEST  1 VIEW COMPARISON:  Portable exam 1631 hours compared to 07/03/2020 FINDINGS: Normal heart size, mediastinal contours, and pulmonary vascularity. Marked elevation of RIGHT diaphragm with bowel interposition. Atelectasis and/or infiltrate at RIGHT base. Remaining lungs clear. No pleural effusion or pneumothorax. Gaseous distention of stomach. IMPRESSION: Elevation of RIGHT diaphragm with atelectasis and/or infiltrate at RIGHT lung base. Gaseous distention of stomach. Electronically Signed   By: Ulyses Southward M.D.   On: 07/22/2020 16:37   DG Chest 2 View  Result Date: 07/04/2020 CLINICAL  DATA:  Dyspnea on exertion. EXAM: CHEST - 2 VIEW COMPARISON:  May 15, 2020. FINDINGS: The heart size and mediastinal contours are within normal limits. Both lungs are clear. No pneumothorax or pleural effusion is noted. The visualized skeletal structures are unremarkable. IMPRESSION: No active cardiopulmonary disease. Electronically Signed   By: Marijo Conception M.D.   On: 07/04/2020 15:36   CT Head Wo Contrast  Result Date: 07/22/2020 CLINICAL DATA:  Delirium; history diabetes mellitus, hypertension, prostate cancer EXAM: CT HEAD WITHOUT CONTRAST TECHNIQUE: Contiguous axial images were obtained from the base of the skull through the vertex without intravenous contrast. Sagittal and coronal MPR images reconstructed from axial data set. COMPARISON:  04/28/2020 FINDINGS: Brain: Generalized atrophy. Diffuse prominence of the ventricular system consistent with atrophy, unchanged. No midline shift or mass effect. Mild small vessel chronic ischemic changes of deep cerebral white matter. No intracranial hemorrhage, mass lesion, or evidence of acute infarction. Vascular: No hyperdense vessels Skull: Intact Sinuses/Orbits:  Clear Other: N/A IMPRESSION: Atrophy with small vessel chronic ischemic changes of deep cerebral white matter. No acute intracranial abnormalities. Electronically Signed   By: Lavonia Dana M.D.   On: 07/22/2020 19:44   CT Angio Chest PE W and/or Wo Contrast  Result Date: 07/22/2020 CLINICAL DATA:  Shortness of breath, delirium, question pulmonary embolism EXAM: CT ANGIOGRAPHY CHEST WITH CONTRAST TECHNIQUE: Multidetector CT imaging of the chest was performed using the standard protocol during bolus administration of intravenous contrast. Multiplanar CT image reconstructions and MIPs were obtained to evaluate the vascular anatomy. CONTRAST:  59mL OMNIPAQUE IOHEXOL 350 MG/ML SOLN IV COMPARISON:  04/30/2020 FINDINGS: Cardiovascular: Atherosclerotic calcifications aorta and coronary arteries. Aorta normal caliber without aneurysm or dissection. Borderline for LEFT ventricular hypertrophy. No pericardial effusion. Pulmonary arteries adequately opacified. Lower lobe pulmonary artery branches are suboptimally assessed due to consolidation and beam hardening. No definite pulmonary emboli identified. Mediastinum/Nodes: Esophagus unremarkable. Base of cervical region normal appearance. No thoracic adenopathy. Lungs/Pleura: Significant elevation of RIGHT diaphragm with bowel interposition. Consolidation involving RIGHT lower lobe as well as posterior segment of RIGHT upper lobe consistent with pneumonia. Consolidation in medial LEFT lower lobe as well. Minimal lingular infiltrate. Small RIGHT pleural effusion. No pneumothorax. Upper Abdomen: Bowel interposition. Scattered foci of gas are seen under the RIGHT diaphragm which appear to be related to bowel loops. No definite free air. Visualized portion of liver unremarkable. Musculoskeletal: No acute osseous findings. Diffuse ossification of the interspinous ligament. Review of the MIP images confirms the above findings. IMPRESSION: No definite evidence of pulmonary embolism,  with assessment of lower lobe pulmonary artery branches limited due to beam hardening. Significant elevation of RIGHT diaphragm with bowel interposition. BILATERAL lower lobe consolidation consistent with pneumonia, greater on RIGHT. Small RIGHT pleural effusion. Aortic Atherosclerosis (ICD10-I70.0). Electronically Signed   By: Lavonia Dana M.D.   On: 07/22/2020 19:58   [place] EKG SITE RITE  Result Date: 07/23/2020 If Site Rite image not attached, placement could not be confirmed due to current cardiac rhythm.   Microbiology: Recent Results (from the past 240 hour(s))  Resp Panel by RT-PCR (Flu A&B, Covid) Nasopharyngeal Swab     Status: None   Collection Time: 07/22/20  4:37 PM   Specimen: Nasopharyngeal Swab; Nasopharyngeal(NP) swabs in vial transport medium  Result Value Ref Range Status   SARS Coronavirus 2 by RT PCR NEGATIVE NEGATIVE Final    Comment: (NOTE) SARS-CoV-2 target nucleic acids are NOT DETECTED.  The SARS-CoV-2 RNA is generally detectable in upper respiratory specimens during  the acute phase of infection. The lowest concentration of SARS-CoV-2 viral copies this assay can detect is 138 copies/mL. A negative result does not preclude SARS-Cov-2 infection and should not be used as the sole basis for treatment or other patient management decisions. A negative result may occur with  improper specimen collection/handling, submission of specimen other than nasopharyngeal swab, presence of viral mutation(s) within the areas targeted by this assay, and inadequate number of viral copies(<138 copies/mL). A negative result must be combined with clinical observations, patient history, and epidemiological information. The expected result is Negative.  Fact Sheet for Patients:  EntrepreneurPulse.com.au  Fact Sheet for Healthcare Providers:  IncredibleEmployment.be  This test is no t yet approved or cleared by the [place] FDA and  has been  authorized for detection and/or diagnosis of SARS-CoV-2 by FDA under an Emergency Use Authorization (EUA). This EUA will remain  in effect (meaning this test can be used) for the duration of the COVID-19 declaration under Section 564(b)(1) of the Act, 21 U.S.C.section 360bbb-3(b)(1), unless the authorization is terminated  or revoked sooner.       Influenza A by PCR NEGATIVE NEGATIVE Final   Influenza B by PCR NEGATIVE NEGATIVE Final    Comment: (NOTE) The Xpert Xpress SARS-CoV-2/FLU/RSV plus assay is intended as an aid in the diagnosis of influenza from Nasopharyngeal swab specimens and should not be used as a sole basis for treatment. Nasal washings and aspirates are unacceptable for Xpert Xpress SARS-CoV-2/FLU/RSV testing.  Fact Sheet for Patients: EntrepreneurPulse.com.au  Fact Sheet for Healthcare Providers: IncredibleEmployment.be  This test is not yet approved or cleared by the [place] FDA and has been authorized for detection and/or diagnosis of SARS-CoV-2 by FDA under an Emergency Use Authorization (EUA). This EUA will remain in effect (meaning this test can be used) for the duration of the COVID-19 declaration under Section 564(b)(1) of the Act, 21 U.S.C. section 360bbb-3(b)(1), unless the authorization is terminated or revoked.  Performed at [facility], [place] [address], [place], [place] [address]   Culture, blood (Routine X 2) w Reflex to ID Panel     Status: None   Collection Time: 07/22/20  9:20 PM   Specimen: BLOOD LEFT HAND  Result Value Ref Range Status   Specimen Description BLOOD LEFT HAND  Final   Special Requests   Final    BOTTLES DRAWN AEROBIC AND ANAEROBIC Blood Culture adequate volume   Culture   Final    NO GROWTH 5 DAYS Performed at [facility], [place] [address], [place], [place] [address]    Report Status 07/27/2020 FINAL  Final  Culture, blood (Routine X 2) w Reflex to ID Panel     Status:  None   Collection Time: 07/22/20  9:20 PM   Specimen: BLOOD RIGHT HAND  Result Value Ref Range Status   Specimen Description BLOOD RIGHT HAND  Final   Special Requests   Final    BOTTLES DRAWN AEROBIC AND ANAEROBIC Blood Culture adequate volume   Culture   Final    NO GROWTH 5 DAYS Performed at [facility], [place] [address], [place], [place] [address]    Report Status 07/27/2020 FINAL  Final  MRSA PCR Screening     Status: None   Collection Time: 07/23/20 12:00 AM   Specimen: Nasopharyngeal  Result Value Ref Range Status   MRSA by PCR NEGATIVE NEGATIVE Final    Comment:        The GeneXpert MRSA  Assay (FDA approved for NASAL specimens only), is one component of a comprehensive MRSA colonization surveillance program. It is not intended to diagnose MRSA infection nor to guide or monitor treatment for MRSA infections. Performed at [facility], [place] [address], [place], [place] [address]   SARS CORONAVIRUS 2 (TAT 6-24 HRS) Nasopharyngeal Nasopharyngeal Swab     Status: None   Collection Time: 07/27/20  9:29 AM   Specimen: Nasopharyngeal Swab  Result Value Ref Range Status   SARS Coronavirus 2 NEGATIVE NEGATIVE Final    Comment: (NOTE) SARS-CoV-2 target nucleic acids are NOT DETECTED.  The SARS-CoV-2 RNA is generally detectable in upper and lower respiratory specimens during the acute phase of infection. Negative results do not preclude SARS-CoV-2 infection, do not rule out co-infections with other pathogens, and should not be used as the sole basis for treatment or other patient management decisions. Negative results must be combined with clinical observations, patient history, and epidemiological information. The expected result is Negative.  Fact Sheet for Patients: SugarRoll.be  Fact Sheet for Healthcare Providers: https://www.woods-mathews.com/  This test is not yet approved or cleared by the [place] FDA and   has been authorized for detection and/or diagnosis of SARS-CoV-2 by FDA under an Emergency Use Authorization (EUA). This EUA will remain  in effect (meaning this test can be used) for the duration of the COVID-19 declaration under Se ction 564(b)(1) of the Act, 21 U.S.C. section 360bbb-3(b)(1), unless the authorization is terminated or revoked sooner.  Performed at [facility], [place] [address] [place], [place] [address]      Labs: Basic Metabolic Panel: Recent Labs  Lab 07/23/20 0104 07/24/20 0255 07/25/20 0036 07/26/20 0056 07/27/20 0302 07/27/20 1727  NA 138 136 138 138 139 141  K 4.8 5.0 5.1 4.9 4.8 4.5  CL 95* 92* 95* 97* 97* 97*  CO2 33* 35* 35* 35* 35* 36*  GLUCOSE 211* 158* 229* 133* 189* 142*  BUN 29* 30* 34* 34* 34* 30*  CREATININE 1.37* 1.48* 1.87* 1.82* 1.72* 1.62*  CALCIUM 8.5* 8.4* 8.3* 8.1* 8.5* 8.6*  MG 1.8  --   --   --   --  1.9   Liver Function Tests: Recent Labs  Lab 07/22/20 1628 07/24/20 0255 07/27/20 0302  AST 36 20 20  ALT 45* 35 30  ALKPHOS 116 105 75  BILITOT 0.5 0.5 0.6  PROT 5.7* 5.5* 5.1*  ALBUMIN 2.4* 2.2* 1.9*   No results for input(s): LIPASE, AMYLASE in the last 168 hours. No results for input(s): AMMONIA in the last 168 hours. CBC: Recent Labs  Lab 07/22/20 1628 07/22/20 1707 07/23/20 0104 07/24/20 0255 07/25/20 0036 07/26/20 0056 07/27/20 0302  WBC 11.1*  --  16.5* 9.5 8.7 7.8 7.9  NEUTROABS 6.5  --   --   --   --   --   --   HGB 10.9*   < > 10.5* 9.7* 9.1* 8.2* 8.7*  HCT 38.1*   < > 35.3* 34.3* 30.8* 27.6* 30.3*  MCV 94.5  --  94.6 95.8 92.2 91.4 94.1  PLT 401*  --  395 338 309 264 265   < > = values in this interval not displayed.   Cardiac Enzymes: No results for input(s): CKTOTAL, CKMB, CKMBINDEX, TROPONINI in the last 168 hours. BNP: BNP (last 3 results) Recent Labs    05/10/20 1945 07/22/20 1628 07/25/20 1310  BNP 140.6* 222.2* 354.4*    ProBNP (last 3 results) No results for  input(s):  PROBNP in the last 8760 hours.  CBG: Recent Labs  Lab 07/27/20 0601 07/27/20 1130 07/27/20 1647 07/27/20 2124 07/28/20 0634  GLUCAP 170* 126* 103* 146* 114*       Signed:  Annita Brod, MD [facility] 07/28/2020, 10:27 AM

## 2020-07-29 DIAGNOSIS — J9601 Acute respiratory failure with hypoxia: Secondary | ICD-10-CM | POA: Diagnosis not present

## 2020-07-29 DIAGNOSIS — J690 Pneumonitis due to inhalation of food and vomit: Secondary | ICD-10-CM | POA: Diagnosis not present

## 2020-07-29 DIAGNOSIS — A419 Sepsis, unspecified organism: Secondary | ICD-10-CM | POA: Diagnosis not present

## 2020-07-29 DIAGNOSIS — N1831 Chronic kidney disease, stage 3a: Secondary | ICD-10-CM | POA: Diagnosis not present

## 2020-07-29 LAB — GLUCOSE, CAPILLARY
Glucose-Capillary: 152 mg/dL — ABNORMAL HIGH (ref 70–99)
Glucose-Capillary: 122 mg/dL — ABNORMAL HIGH (ref 70–99)
Glucose-Capillary: 226 mg/dL — ABNORMAL HIGH (ref 70–99)
Glucose-Capillary: 233 mg/dL — ABNORMAL HIGH (ref 70–99)

## 2020-07-29 NOTE — Progress Notes (Signed)
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   [facility] palliative will follow up at discharge.  If you have any questions or concerns please call.  Thank You,  Yolande Jolly, BSN, [facility] [phone]

## 2020-07-29 NOTE — Progress Notes (Addendum)
PROGRESS NOTE  Jay Holloway FIE:[id number] DOB: 12-20-1945 DOA: 07/22/2020 PCP: [name], MD  HPI/Recap of past 66 hours: 75 year old male with past medical history of hypertension, diabetes mellitus, diastolic CHF, DVT/PE on Eliquis and stage IIIa chronic kidney disease presented from a skilled nursing facility after an episode of unresponsiveness.  There was a possible cardiac arrest at his nursing facility as they were unable to palpate a pulse and patient went 5 minutes CPR but upon arrival of EMS, patient was awake and with pulses.  Looks to be somewhat disoriented.  Oxygen saturations are 83%.  Brought into the emergency room and found to have sepsis secondary to pneumonia aspiration pneumonia with acute respiratory failure.  Admitted to the hospitalist service.  He has had previous admissions for dysphagia and is at high risk for recurrent aspiration.  Seen by speech therapy and placed on dysphagia 3 although this will not be complete.  Seen by palliative care and discussion with patient and his nephew done.  He seemed to misunderstand and was upset about his DNR bracelet and requested himself to be a full code.  Follow-up discussion planned for tomorrow.  In the interim, IV access lost and new PICC line placed on 12/26.  Since 12/26 evening, blood pressures have been slightly soft requiring some fluid boluses.  Patient received Percocet on morning of 12/27 which made him lethargic.  This improved after Percocet was discontinued.  Palliative care met with patient and nephew on 12/27 and  extensive discussion, he was switched to a partial code.  They understand of how frail he is and with frequent hospitalizations, are looking at palliative care versus hospice when he returns back to the facility.   On night of 12/27, oxygenation worsened due to patient being volume overloaded from aggressive fluid resuscitation and he required 8 L.  Able to be weaned back down to 3 L x 12/28.  Fluid stopped,  started on diuresis and he has since diuresed over 6 L of fluid.  Has been a bit more awake.  Assessment/Plan: Principal Problem: Severe sepsis ([place]) present on admission from aspiration pneumonia from dysphagia causing acute respiratory failure with hypoxia and hypercapnia: Patient meets criteria given respiratory failure, pneumonia source and SIRS criteria.  Sepsis itself has resolved.  White blood cell count normal.  Respiratory failure issues have transitioned from aspiration pneumonia to heart failure now as etiology.  His long-term aspiration risks remain high.  Palliative care following and palliative care will follow patient when he returns back to skilled nursing.  Now partial code. Active Problems:   Essential hypertension: Initially hypotensive requiring fluid boluses blood pressure since stabilized and slightly soft although now tolerating diuresis.  GERD without esophagitis   Acute on chronic diastolic CHF (congestive heart failure) (HCC) mild elevation of BNP.  Initially nothing seen on chest x-ray or CT in form of fluid.  With aggressive fluid resuscitation, he became volume overloaded and now fluid stopped and he is being diuresed.    Senile dementia without behavioral disturbance: Mild although would favor any major medical decisions need to go through his nephew  Severe protein calorie malnutrition: Patient meets criteria in the context of chronic illness including his congestive heart failure and this is evidenced by his severe muscle and fat depletion.  Seen by nutrition.  Put on Ensure 3 times daily, Magic cup 3 times daily, Prosource twice daily plus multivitamin.  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  CBGs mildly elevated in the context of his acute illness.  A1c done 2-1/2 months ago elevated at 7.8.   Stage IV decubital ulcer: Present on admission.  Appreciate  wound care help.  Receiving Santyl dressing plus saline moistened gauze then dry gauze or abdominal pad and changing daily.  Air mattress.  Acute kidney injury in the setting of CKD (chronic kidney disease), stage IIIa: Has improved with diuresis.  Will recheck in the morning.  Code Status: Partial code  Family Communication: Left message for nephew.  Disposition Plan: Relatively stable although long-term, he is at high risk for mortality.  Initially was supposed to transfer to skilled nursing on Thursday but due to insurance issues and bed capability, not able to take him until Monday.  Consultants:  Palliative care  Procedures:  PICC line placement 12/26  Antimicrobials:  IV vancomycin 12/25-12/26  IV Zosyn 12/25-1/1  DVT prophylaxis: Heparin infusion   Objective: Vitals:   07/29/20 0800 07/29/20 1100  BP: 102/62 101/61  Pulse: 92 93  Resp: 19 20  Temp: 99.3 F (37.4 C) 99 F (37.2 C)  SpO2: 100% 100%    Intake/Output Summary (Last 24 hours) at 07/29/2020 1123 Last data filed at 07/29/2020 1100 Gross per 24 hour  Intake 100 ml  Output 1100 ml  Net -1000 ml   Filed Weights   07/22/20 2300 07/22/20 2321  Weight: 90.7 kg 98.4 kg   Body mass index is 24.44 kg/m.  Exam:   General: Fatigued, emaciated, no acute distress  HEENT: Normocephalic, atraumatic, mucous membranes are dry, poor dentition  Cardiovascular: Regular rate and rhythm, S1-S2  Respiratory: Rales bilaterally, decreased breath sounds bibasilar  Abdomen: Soft, nontender, nondistended, hypoactive bowel sounds  Musculoskeletal: No clubbing or cyanosis, trace pitting edema  Psychiatry: Minimal underlying dementia.  No evidence of acute psychosis   Data Reviewed: CBC: Recent Labs  Lab 07/22/20 1628 07/22/20 1707 07/23/20 0104 07/24/20 0255 07/25/20 0036 07/26/20 0056 07/27/20 0302  WBC 11.1*  --  16.5* 9.5 8.7 7.8 7.9  NEUTROABS 6.5  --   --   --   --   --   --   HGB 10.9*   < >  10.5* 9.7* 9.1* 8.2* 8.7*  HCT 38.1*   < > 35.3* 34.3* 30.8* 27.6* 30.3*  MCV 94.5  --  94.6 95.8 92.2 91.4 94.1  PLT 401*  --  395 338 309 264 265   < > = values in this interval not displayed.   Basic Metabolic Panel: Recent Labs  Lab 07/23/20 0104 07/24/20 0255 07/25/20 0036 07/26/20 0056 07/27/20 0302 07/27/20 1727 07/28/20 1320  NA 138   < > 138 138 139 141 141  K 4.8   < > 5.1 4.9 4.8 4.5 4.7  CL 95*   < > 95* 97* 97* 97* 95*  CO2 33*   < > 35* 35* 35* 36* 37*  GLUCOSE 211*   < > 229* 133* 189* 142* 240*  BUN 29*   < > 34* 34* 34* 30* 27*  CREATININE 1.37*   < > 1.87* 1.82* 1.72* 1.62* 1.48*  CALCIUM 8.5*   < > 8.3* 8.1* 8.5* 8.6* 8.5*  MG 1.8  --   --   --   --  1.9 1.8   < > = values in this interval not displayed.   GFR: Estimated Creatinine Clearance: 58.9 mL/min (A) (by C-G formula based on SCr of 1.48 mg/dL (H)). Liver  Function Tests: Recent Labs  Lab 07/22/20 1628 07/24/20 0255 07/27/20 0302  AST 36 20 20  ALT 45* 35 30  ALKPHOS 116 105 75  BILITOT 0.5 0.5 0.6  PROT 5.7* 5.5* 5.1*  ALBUMIN 2.4* 2.2* 1.9*   No results for input(s): LIPASE, AMYLASE in the last 168 hours. No results for input(s): AMMONIA in the last 168 hours. Coagulation Profile: No results for input(s): INR, PROTIME in the last 168 hours. Cardiac Enzymes: No results for input(s): CKTOTAL, CKMB, CKMBINDEX, TROPONINI in the last 168 hours. BNP (last 3 results) No results for input(s): PROBNP in the last 8760 hours. HbA1C: No results for input(s): HGBA1C in the last 72 hours. CBG: Recent Labs  Lab 07/28/20 1124 07/28/20 1646 07/28/20 2137 07/29/20 0606 07/29/20 1104  GLUCAP 179* 220* 215* 152* 122*   Lipid Profile: No results for input(s): CHOL, HDL, LDLCALC, TRIG, CHOLHDL, LDLDIRECT in the last 72 hours. Thyroid Function Tests: No results for input(s): TSH, T4TOTAL, FREET4, T3FREE, THYROIDAB in the last 72 hours. Anemia Panel: No results for input(s): VITAMINB12, FOLATE,  FERRITIN, TIBC, IRON, RETICCTPCT in the last 72 hours. Urine analysis:    Component Value Date/Time   COLORURINE AMBER (A) 04/28/2020 2016   APPEARANCEUR CLOUDY (A) 04/28/2020 2016   LABSPEC 1.020 04/28/2020 2016   PHURINE 5.0 04/28/2020 2016   GLUCOSEU NEGATIVE 04/28/2020 2016   HGBUR NEGATIVE 04/28/2020 2016   [place] NEGATIVE 04/28/2020 2016   KETONESUR NEGATIVE 04/28/2020 2016   PROTEINUR 100 (A) 04/28/2020 2016   UROBILINOGEN 0.2 11/05/2014 1712   NITRITE NEGATIVE 04/28/2020 2016   LEUKOCYTESUR LARGE (A) 04/28/2020 2016   Sepsis Labs: $[email or web address]'[id number]$ (procalcitonin:4,lacticidven:4)  ) Recent Results (from the past 240 hour(s))  Resp Panel by RT-PCR (Flu A&B, Covid) Nasopharyngeal Swab     Status: None   Collection Time: 07/22/20  4:37 PM   Specimen: Nasopharyngeal Swab; Nasopharyngeal(NP) swabs in vial transport medium  Result Value Ref Range Status   SARS Coronavirus 2 by RT PCR NEGATIVE NEGATIVE Final    Comment: (NOTE) SARS-CoV-2 target nucleic acids are NOT DETECTED.  The SARS-CoV-2 RNA is generally detectable in upper respiratory specimens during the acute phase of infection. The lowest concentration of SARS-CoV-2 viral copies this assay can detect is 138 copies/mL. A negative result does not preclude SARS-Cov-2 infection and should not be used as the sole basis for treatment or other patient management decisions. A negative result may occur with  improper specimen collection/handling, submission of specimen other than nasopharyngeal swab, presence of viral mutation(s) within the areas targeted by this assay, and inadequate number of viral copies(<138 copies/mL). A negative result must be combined with clinical observations, patient history, and epidemiological information. The expected result is Negative.  Fact Sheet for Patients:  EntrepreneurPulse.com.au  Fact Sheet for Healthcare Providers:  IncredibleEmployment.be  This  test is no t yet approved or cleared by the [place] FDA and  has been authorized for detection and/or diagnosis of SARS-CoV-2 by FDA under an Emergency Use Authorization (EUA). This EUA will remain  in effect (meaning this test can be used) for the duration of the COVID-19 declaration under Section 564(b)(1) of the Act, 21 U.S.C.section 360bbb-3(b)(1), unless the authorization is terminated  or revoked sooner.       Influenza A by PCR NEGATIVE NEGATIVE Final   Influenza B by PCR NEGATIVE NEGATIVE Final    Comment: (NOTE) The Xpert Xpress SARS-CoV-2/FLU/RSV plus assay is intended as an aid in the diagnosis of influenza from Nasopharyngeal swab specimens  and should not be used as a sole basis for treatment. Nasal washings and aspirates are unacceptable for Xpert Xpress SARS-CoV-2/FLU/RSV testing.  Fact Sheet for Patients: EntrepreneurPulse.com.au  Fact Sheet for Healthcare Providers: IncredibleEmployment.be  This test is not yet approved or cleared by the [place] FDA and has been authorized for detection and/or diagnosis of SARS-CoV-2 by FDA under an Emergency Use Authorization (EUA). This EUA will remain in effect (meaning this test can be used) for the duration of the COVID-19 declaration under Section 564(b)(1) of the Act, 21 U.S.C. section 360bbb-3(b)(1), unless the authorization is terminated or revoked.  Performed at [facility], [place] [address] [place], [place] [address]   Culture, blood (Routine X 2) w Reflex to ID Panel     Status: None   Collection Time: 07/22/20  9:20 PM   Specimen: BLOOD LEFT HAND  Result Value Ref Range Status   Specimen Description BLOOD LEFT HAND  Final   Special Requests   Final    BOTTLES DRAWN AEROBIC AND ANAEROBIC Blood Culture adequate volume   Culture   Final    NO GROWTH 5 DAYS Performed at [facility], [place] [address] [place], [place] [address]    Report Status 07/27/2020  FINAL  Final  Culture, blood (Routine X 2) w Reflex to ID Panel     Status: None   Collection Time: 07/22/20  9:20 PM   Specimen: BLOOD RIGHT HAND  Result Value Ref Range Status   Specimen Description BLOOD RIGHT HAND  Final   Special Requests   Final    BOTTLES DRAWN AEROBIC AND ANAEROBIC Blood Culture adequate volume   Culture   Final    NO GROWTH 5 DAYS Performed at [facility], [employer] [address] [place], [place] [address]    Report Status 07/27/2020 FINAL  Final  MRSA PCR Screening     Status: None   Collection Time: 07/23/20 12:00 AM   Specimen: Nasopharyngeal  Result Value Ref Range Status   MRSA by PCR NEGATIVE NEGATIVE Final    Comment:        The GeneXpert MRSA Assay (FDA approved for NASAL specimens only), is one component of a comprehensive MRSA colonization surveillance program. It is not intended to diagnose MRSA infection nor to guide or monitor treatment for MRSA infections. Performed at [facility], [place] [address], [place], [place] [address]   SARS CORONAVIRUS 2 (TAT 6-24 HRS) Nasopharyngeal Nasopharyngeal Swab     Status: None   Collection Time: 07/27/20  9:29 AM   Specimen: Nasopharyngeal Swab  Result Value Ref Range Status   SARS Coronavirus 2 NEGATIVE NEGATIVE Final    Comment: (NOTE) SARS-CoV-2 target nucleic acids are NOT DETECTED.  The SARS-CoV-2 RNA is generally detectable in upper and lower respiratory specimens during the acute phase of infection. Negative results do not preclude SARS-CoV-2 infection, do not rule out co-infections with other pathogens, and should not be used as the sole basis for treatment or other patient management decisions. Negative results must be combined with clinical observations, patient history, and epidemiological information. The expected result is Negative.  Fact Sheet for Patients: SugarRoll.be  Fact Sheet for Healthcare  Providers: https://www.woods-mathews.com/  This test is not yet approved or cleared by the [place] FDA and  has been authorized for detection and/or diagnosis of SARS-CoV-2 by FDA under an Emergency Use Authorization (EUA). This EUA will remain  in effect (meaning this test can be used) for  the duration of the COVID-19 declaration under Se ction 564(b)(1) of the Act, 21 U.S.C. section 360bbb-3(b)(1), unless the authorization is terminated or revoked sooner.  Performed at [facility], [place] [address], [place], [place] [address]       Studies: No results found.  Scheduled Meds: . (feeding supplement) PROSource Plus  30 mL Oral BID BM  . apixaban  5 mg Oral BID  . atorvastatin  10 mg Oral Daily  . Chlorhexidine Gluconate Cloth  6 each Topical Daily  . cholecalciferol  1,000 Units Oral Daily  . collagenase   Topical Daily  . diltiazem  60 mg Oral Q8H  . feeding supplement  237 mL Oral TID BM  . furosemide  20 mg Intravenous BID  . gabapentin  100 mg Oral TID  . insulin aspart  0-5 Units Subcutaneous QHS  . insulin aspart  0-9 Units Subcutaneous TID WC  . insulin glargine  7 Units Subcutaneous Daily  . magnesium oxide  400 mg Oral Daily  . multivitamin with minerals  1 tablet Oral Daily  . pantoprazole  40 mg Oral BID  . sodium chloride flush  10-40 mL Intracatheter Q12H    Continuous Infusions: . piperacillin-tazobactam (ZOSYN)  IV 3.375 g (07/29/20 1019)     LOS: 7 days     Annita Brod, MD [facility]   07/29/2020, 11:23 AM

## 2020-07-29 NOTE — TOC Progression Note (Addendum)
Transition of Care [facility]) - Progression Note    Patient Details  Name: Jay Holloway MRN: [id number] Date of Birth: [birth year]  Transition of Care [facility]) CM/SW Contact  Levada Schilling Phone Number: 07/29/2020, 8:34 AM  Clinical Narrative:    CSW was consulted by physician on pt's disposition to SNF.  CSW left phone message for Olegario Messier at [facility] to return phone call concerning pt's disposition.  Update: [employer] has not received insurance auth for disposition.  May have it on Monday.  Expected Discharge Plan: Skilled Nursing Facility Barriers to Discharge: Continued Medical Work up  Expected Discharge Plan and Services Expected Discharge Plan: Skilled Nursing Facility       Living arrangements for the past 2 months: Skilled Nursing Facility Expected Discharge Date: 07/27/20                                     Social Determinants of Health (SDOH) Interventions    Readmission Risk Interventions No flowsheet data found.

## 2020-07-30 DIAGNOSIS — A419 Sepsis, unspecified organism: Secondary | ICD-10-CM | POA: Diagnosis not present

## 2020-07-30 DIAGNOSIS — R6520 Severe sepsis without septic shock: Secondary | ICD-10-CM | POA: Diagnosis not present

## 2020-07-30 DIAGNOSIS — J9601 Acute respiratory failure with hypoxia: Secondary | ICD-10-CM | POA: Diagnosis not present

## 2020-07-30 LAB — GLUCOSE, CAPILLARY
Glucose-Capillary: 193 mg/dL — ABNORMAL HIGH (ref 70–99)
Glucose-Capillary: 218 mg/dL — ABNORMAL HIGH (ref 70–99)
Glucose-Capillary: 262 mg/dL — ABNORMAL HIGH (ref 70–99)
Glucose-Capillary: 194 mg/dL — ABNORMAL HIGH (ref 70–99)

## 2020-07-30 MED ORDER — FUROSEMIDE 20 MG PO TABS
20.0000 mg | ORAL_TABLET | Freq: Two times a day (BID) | ORAL | Status: DC
  Administered 2020-07-30 – 2020-07-31 (×3): 20 mg via ORAL
  Filled 2020-07-30 (×3): qty 1

## 2020-07-30 NOTE — Progress Notes (Signed)
PROGRESS NOTE    Jay Holloway  WYO:[id number]  DOB: August 03, 1945  DOA: 07/22/2020 PCP: [name], MD Outpatient Specialists:   Hospital course:  75 year old male with past medical history of hypertension, diabetes mellitus, diastolic CHF, DVT/PE on Eliquis and stage IIIa chronic kidney disease presented from a skilled nursing facility after an episode of unresponsiveness.  There was a possible cardiac arrest at his nursing facility as they were unable to palpate a pulse and patient went 5 minutes CPR but upon arrival of EMS, patient was awake and with pulses.  Looks to be somewhat disoriented.  Oxygen saturations are 83%.  Brought into the emergency room and found to have sepsis secondary to pneumonia aspiration pneumonia with acute respiratory failure.  Admitted to the hospitalist service.  He has had previous admissions for dysphagia and is at high risk for recurrent aspiration.  Seen by speech therapy and placed on dysphagia 3 although this will not be complete.  Seen by palliative care and discussion with patient and his nephew done.  He seemed to misunderstand and was upset about his DNR bracelet and requested himself to be a full code.  Follow-up discussion planned for tomorrow.  In the interim, IV access lost and new PICC line placed on 12/26.  Since 12/26 evening, blood pressures have been slightly soft requiring some fluid boluses.  Patient received Percocet on morning of 12/27 which made him lethargic.  This improved after Percocet was discontinued.  Palliative care met with patient and nephew on 12/27 and  extensive discussion, he was switched to a partial code.  They understand of how frail he is and with frequent hospitalizations, are looking at palliative care versus hospice when he returns back to the facility.   Subjective:  Patient states that his legs hurt.  Admits that his legs have been hurting for several months.  Other than that he is wondering why he cannot go home.   Admits that he is weak but states that he is always been weak.  Feels like he could go home rather than go to rehab.  Discussed the significance of his recent medical problems and need for rehab.  Patient feels that he is stronger than people think that he is.   Objective: Vitals:   07/30/20 0400 07/30/20 0700 07/30/20 0800 07/30/20 1100  BP: 105/62 109/69 101/64 106/69  Pulse: 100 (!) 102 95 95  Resp: _0 Temp:   99.3 F (37.4 C) 98 F (36.7 C)  TempSrc:   Axillary Axillary  SpO2: 100% 98% 97% 98%  Weight:      Height:        Intake/Output Summary (Last 24 hours) at 07/30/2020 1455 Last data filed at 07/30/2020 1230 Gross per 24 hour  Intake 280 ml  Output 1275 ml  Net -995 ml   Filed Weights   07/22/20 2300 07/22/20 2321  Weight: 90.7 kg 98.4 kg     Exam:  General: Very weak appearing man looking much older than stated age sitting in bed with legs down. Eyes: Bitemporal wasting, conjunctiva are pale, sclera anicteric CVS: S1-S2, regular  Respiratory:  decreased air entry bilaterally secondary to decreased inspiratory effort, rales at bases  GI: NABS, soft, NT, scaphoid belly LE: No edema.  Neuro: grossly nonfocal.  Psych: mood and affect appropriate to situation, there does seem to be a level of denial   Assessment & Plan:   75 year old man who has been treated for sepsis secondary to likely aspiration  pneumonia secondary to dysphagia.  Pneumonia has been complicated by decompensated heart failure.  Patient has been diuresed and is now awaiting placement at SNF.  Palliative care has followed patient and he is partial code.  Awaiting placement at SNF.  Acute on chronic respiratory failure Initially secondary to aspiration pneumonia then complicated by decompensated heart failure secondary to aggressive fluid resuscitation. Patient has been treated with antibiotics and has diuresed well. Patient presently on Lasix 20 mg IV twice daily Home doses of Lasix are 20  mg daily Will change to Lasix 20 mg p.o. twice daily as patient remains on 3 L HFNC  HTN BP is low normal on increased dose of Lasix  DM2 Under reasonable control on present medication regimen with SSI AC at bedtime  CKD 3a Creatinine has improved with diuresis  Stage IV decubitus ulcer Present on admission Patient is on air mattress Appreciate wound care assistance  Severe protein calorie malnutrition Has been followed by nutrition, on Ensure 3 times daily    DVT prophylaxis: On Eliquis Code Status: Partial code Family Communication: None today Disposition Plan:   Patient is from: Home  Anticipated Discharge Location: SNF  Barriers to Discharge: Awaiting bed, complicated by insurance  Is patient medically stable for Discharge: Yes   Consultants:  Palliative care  Procedures:  PICC line placement 07/23/2020  Antimicrobials:  Previously on vancomycin and Zosyn, none now   Data Reviewed:  Basic Metabolic Panel: Recent Labs  Lab 07/25/20 0036 07/26/20 0056 07/27/20 0302 07/27/20 1727 07/28/20 1320  NA 138 138 139 141 141  K 5.1 4.9 4.8 4.5 4.7  CL 95* 97* 97* 97* 95*  CO2 35* 35* 35* 36* 37*  GLUCOSE 229* 133* 189* 142* 240*  BUN 34* 34* 34* 30* 27*  CREATININE 1.87* 1.82* 1.72* 1.62* 1.48*  CALCIUM 8.3* 8.1* 8.5* 8.6* 8.5*  MG  --   --   --  1.9 1.8   Liver Function Tests: Recent Labs  Lab 07/24/20 0255 07/27/20 0302  AST 20 20  ALT 35 30  ALKPHOS 105 75  BILITOT 0.5 0.6  PROT 5.5* 5.1*  ALBUMIN 2.2* 1.9*   No results for input(s): LIPASE, AMYLASE in the last 168 hours. No results for input(s): AMMONIA in the last 168 hours. CBC: Recent Labs  Lab 07/24/20 0255 07/25/20 0036 07/26/20 0056 07/27/20 0302  WBC 9.5 8.7 7.8 7.9  HGB 9.7* 9.1* 8.2* 8.7*  HCT 34.3* 30.8* 27.6* 30.3*  MCV 95.8 92.2 91.4 94.1  PLT 338 309 264 265   Cardiac Enzymes: No results for input(s): CKTOTAL, CKMB, CKMBINDEX, TROPONINI in the last 168 hours. BNP  (last 3 results) No results for input(s): PROBNP in the last 8760 hours. CBG: Recent Labs  Lab 07/29/20 1104 07/29/20 1739 07/29/20 2129 07/30/20 0635 07/30/20 1139  GLUCAP 122* 226* 233* 193* 218*    Recent Results (from the past 240 hour(s))  Resp Panel by RT-PCR (Flu A&B, Covid) Nasopharyngeal Swab     Status: None   Collection Time: 07/22/20  4:37 PM   Specimen: Nasopharyngeal Swab; Nasopharyngeal(NP) swabs in vial transport medium  Result Value Ref Range Status   SARS Coronavirus 2 by RT PCR NEGATIVE NEGATIVE Final    Comment: (NOTE) SARS-CoV-2 target nucleic acids are NOT DETECTED.  The SARS-CoV-2 RNA is generally detectable in upper respiratory specimens during the acute phase of infection. The lowest concentration of SARS-CoV-2 viral copies this assay can detect is 138 copies/mL. A negative result does not preclude SARS-Cov-2  infection and should not be used as the sole basis for treatment or other patient management decisions. A negative result may occur with  improper specimen collection/handling, submission of specimen other than nasopharyngeal swab, presence of viral mutation(s) within the areas targeted by this assay, and inadequate number of viral copies(<138 copies/mL). A negative result must be combined with clinical observations, patient history, and epidemiological information. The expected result is Negative.  Fact Sheet for Patients:  EntrepreneurPulse.com.au  Fact Sheet for Healthcare Providers:  IncredibleEmployment.be  This test is no t yet approved or cleared by the [place] FDA and  has been authorized for detection and/or diagnosis of SARS-CoV-2 by FDA under an Emergency Use Authorization (EUA). This EUA will remain  in effect (meaning this test can be used) for the duration of the COVID-19 declaration under Section 564(b)(1) of the Act, 21 U.S.C.section 360bbb-3(b)(1), unless the authorization is  terminated  or revoked sooner.       Influenza A by PCR NEGATIVE NEGATIVE Final   Influenza B by PCR NEGATIVE NEGATIVE Final    Comment: (NOTE) The Xpert Xpress SARS-CoV-2/FLU/RSV plus assay is intended as an aid in the diagnosis of influenza from Nasopharyngeal swab specimens and should not be used as a sole basis for treatment. Nasal washings and aspirates are unacceptable for Xpert Xpress SARS-CoV-2/FLU/RSV testing.  Fact Sheet for Patients: EntrepreneurPulse.com.au  Fact Sheet for Healthcare Providers: IncredibleEmployment.be  This test is not yet approved or cleared by the [place] FDA and has been authorized for detection and/or diagnosis of SARS-CoV-2 by FDA under an Emergency Use Authorization (EUA). This EUA will remain in effect (meaning this test can be used) for the duration of the COVID-19 declaration under Section 564(b)(1) of the Act, 21 U.S.C. section 360bbb-3(b)(1), unless the authorization is terminated or revoked.  Performed at [facility], [place] [address], [place], [place] [address]   Culture, blood (Routine X 2) w Reflex to ID Panel     Status: None   Collection Time: 07/22/20  9:20 PM   Specimen: BLOOD LEFT HAND  Result Value Ref Range Status   Specimen Description BLOOD LEFT HAND  Final   Special Requests   Final    BOTTLES DRAWN AEROBIC AND ANAEROBIC Blood Culture adequate volume   Culture   Final    NO GROWTH 5 DAYS Performed at [facility], [place] [address], [place], [place] [address]    Report Status 07/27/2020 FINAL  Final  Culture, blood (Routine X 2) w Reflex to ID Panel     Status: None   Collection Time: 07/22/20  9:20 PM   Specimen: BLOOD RIGHT HAND  Result Value Ref Range Status   Specimen Description BLOOD RIGHT HAND  Final   Special Requests   Final    BOTTLES DRAWN AEROBIC AND ANAEROBIC Blood Culture adequate volume   Culture   Final    NO GROWTH 5 DAYS Performed at [facility], [place] [address] [place], [place] [address]    Report Status 07/27/2020 FINAL  Final  MRSA PCR Screening     Status: None   Collection Time: 07/23/20 12:00 AM   Specimen: Nasopharyngeal  Result Value Ref Range Status   MRSA by PCR NEGATIVE NEGATIVE Final    Comment:        The GeneXpert MRSA Assay (FDA approved for NASAL specimens only), is one component of a comprehensive MRSA colonization surveillance program. It is not intended to diagnose MRSA infection nor  to guide or monitor treatment for MRSA infections. Performed at [facility], [place] [address] [place], [place] [address]   SARS CORONAVIRUS 2 (TAT 6-24 HRS) Nasopharyngeal Nasopharyngeal Swab     Status: None   Collection Time: 07/27/20  9:29 AM   Specimen: Nasopharyngeal Swab  Result Value Ref Range Status   SARS Coronavirus 2 NEGATIVE NEGATIVE Final    Comment: (NOTE) SARS-CoV-2 target nucleic acids are NOT DETECTED.  The SARS-CoV-2 RNA is generally detectable in upper and lower respiratory specimens during the acute phase of infection. Negative results do not preclude SARS-CoV-2 infection, do not rule out co-infections with other pathogens, and should not be used as the sole basis for treatment or other patient management decisions. Negative results must be combined with clinical observations, patient history, and epidemiological information. The expected result is Negative.  Fact Sheet for Patients: SugarRoll.be  Fact Sheet for Healthcare Providers: https://www.woods-mathews.com/  This test is not yet approved or cleared by the [place] FDA and  has been authorized for detection and/or diagnosis of SARS-CoV-2 by FDA under an Emergency Use Authorization (EUA). This EUA will remain  in effect (meaning this test can be used) for the duration of the COVID-19 declaration under Se ction 564(b)(1) of the Act, 21 U.S.C. section 360bbb-3(b)(1), unless the  authorization is terminated or revoked sooner.  Performed at [facility], [place] [address], [place], [place] [address]       Studies: No results found.   Scheduled Meds: . (feeding supplement) PROSource Plus  30 mL Oral BID BM  . apixaban  5 mg Oral BID  . atorvastatin  10 mg Oral Daily  . Chlorhexidine Gluconate Cloth  6 each Topical Daily  . cholecalciferol  1,000 Units Oral Daily  . collagenase   Topical Daily  . diltiazem  60 mg Oral Q8H  . feeding supplement  237 mL Oral TID BM  . furosemide  20 mg Intravenous BID  . gabapentin  100 mg Oral TID  . insulin aspart  0-5 Units Subcutaneous QHS  . insulin aspart  0-9 Units Subcutaneous TID WC  . insulin glargine  7 Units Subcutaneous Daily  . magnesium oxide  400 mg Oral Daily  . multivitamin with minerals  1 tablet Oral Daily  . pantoprazole  40 mg Oral BID  . sodium chloride flush  10-40 mL Intracatheter Q12H   Continuous Infusions:  Principal Problem:   Sepsis ([place]) Active Problems:   Essential hypertension   GERD without esophagitis   Acute respiratory failure with hypoxia and hypercapnia (HCC)   HCAP (healthcare-associated pneumonia)   Chronic diastolic CHF (congestive heart failure) (HCC)   Type II diabetes mellitus with renal manifestations (HCC)   Decubital ulcer   Unresponsiveness   HLD (hyperlipidemia)   CKD (chronic kidney disease), stage IIIa   Aspiration pneumonia (HCC)   Severe protein-calorie malnutrition ([place])   Palliative care encounter   VT (ventricular tachycardia) ([place])     Jacob Cicero Tublu Paw [place], [facility]  If 7PM-7AM, please contact night-coverage [email or web address] Password [email or web address] 07/30/2020, 2:55 PM    LOS: 8 days

## 2020-07-31 ENCOUNTER — Inpatient Hospital Stay (HOSPITAL_COMMUNITY)
Admission: EM | Admit: 2020-07-31 | Discharge: 2020-08-29 | DRG: 951 | Disposition: E | Payer: Medicare HMO | Attending: Internal Medicine | Admitting: Internal Medicine

## 2020-07-31 DIAGNOSIS — I469 Cardiac arrest, cause unspecified: Secondary | ICD-10-CM | POA: Diagnosis not present

## 2020-07-31 DIAGNOSIS — I462 Cardiac arrest due to underlying cardiac condition: Secondary | ICD-10-CM | POA: Diagnosis present

## 2020-07-31 DIAGNOSIS — Z9049 Acquired absence of other specified parts of digestive tract: Secondary | ICD-10-CM | POA: Diagnosis not present

## 2020-07-31 DIAGNOSIS — I472 Ventricular tachycardia: Secondary | ICD-10-CM | POA: Diagnosis present

## 2020-07-31 DIAGNOSIS — I2699 Other pulmonary embolism without acute cor pulmonale: Secondary | ICD-10-CM | POA: Diagnosis present

## 2020-07-31 DIAGNOSIS — N1831 Chronic kidney disease, stage 3a: Secondary | ICD-10-CM | POA: Diagnosis present

## 2020-07-31 DIAGNOSIS — R092 Respiratory arrest: Secondary | ICD-10-CM

## 2020-07-31 DIAGNOSIS — K219 Gastro-esophageal reflux disease without esophagitis: Secondary | ICD-10-CM | POA: Diagnosis present

## 2020-07-31 DIAGNOSIS — E782 Mixed hyperlipidemia: Secondary | ICD-10-CM | POA: Diagnosis present

## 2020-07-31 DIAGNOSIS — J9602 Acute respiratory failure with hypercapnia: Secondary | ICD-10-CM | POA: Diagnosis present

## 2020-07-31 DIAGNOSIS — J9601 Acute respiratory failure with hypoxia: Secondary | ICD-10-CM | POA: Diagnosis present

## 2020-07-31 DIAGNOSIS — I130 Hypertensive heart and chronic kidney disease with heart failure and stage 1 through stage 4 chronic kidney disease, or unspecified chronic kidney disease: Secondary | ICD-10-CM | POA: Diagnosis present

## 2020-07-31 DIAGNOSIS — Z515 Encounter for palliative care: Secondary | ICD-10-CM | POA: Diagnosis not present

## 2020-07-31 DIAGNOSIS — J189 Pneumonia, unspecified organism: Secondary | ICD-10-CM

## 2020-07-31 DIAGNOSIS — J9819 Other pulmonary collapse: Secondary | ICD-10-CM | POA: Diagnosis not present

## 2020-07-31 DIAGNOSIS — I10 Essential (primary) hypertension: Secondary | ICD-10-CM | POA: Diagnosis not present

## 2020-07-31 DIAGNOSIS — R54 Age-related physical debility: Secondary | ICD-10-CM | POA: Diagnosis present

## 2020-07-31 DIAGNOSIS — I4510 Unspecified right bundle-branch block: Secondary | ICD-10-CM | POA: Diagnosis not present

## 2020-07-31 DIAGNOSIS — Z8546 Personal history of malignant neoplasm of prostate: Secondary | ICD-10-CM | POA: Diagnosis not present

## 2020-07-31 DIAGNOSIS — R4189 Other symptoms and signs involving cognitive functions and awareness: Secondary | ICD-10-CM | POA: Diagnosis present

## 2020-07-31 DIAGNOSIS — R4020 Unspecified coma: Secondary | ICD-10-CM | POA: Diagnosis not present

## 2020-07-31 DIAGNOSIS — R64 Cachexia: Secondary | ICD-10-CM | POA: Diagnosis present

## 2020-07-31 DIAGNOSIS — N183 Chronic kidney disease, stage 3 unspecified: Secondary | ICD-10-CM | POA: Diagnosis present

## 2020-07-31 DIAGNOSIS — R0902 Hypoxemia: Secondary | ICD-10-CM

## 2020-07-31 DIAGNOSIS — Z9079 Acquired absence of other genital organ(s): Secondary | ICD-10-CM

## 2020-07-31 DIAGNOSIS — Z7401 Bed confinement status: Secondary | ICD-10-CM | POA: Diagnosis not present

## 2020-07-31 DIAGNOSIS — Z7901 Long term (current) use of anticoagulants: Secondary | ICD-10-CM | POA: Diagnosis not present

## 2020-07-31 DIAGNOSIS — R5381 Other malaise: Secondary | ICD-10-CM | POA: Diagnosis not present

## 2020-07-31 DIAGNOSIS — Z66 Do not resuscitate: Secondary | ICD-10-CM | POA: Diagnosis present

## 2020-07-31 DIAGNOSIS — J90 Pleural effusion, not elsewhere classified: Secondary | ICD-10-CM | POA: Diagnosis not present

## 2020-07-31 DIAGNOSIS — E1165 Type 2 diabetes mellitus with hyperglycemia: Secondary | ICD-10-CM | POA: Diagnosis not present

## 2020-07-31 DIAGNOSIS — Z87891 Personal history of nicotine dependence: Secondary | ICD-10-CM | POA: Diagnosis not present

## 2020-07-31 DIAGNOSIS — R140 Abdominal distension (gaseous): Secondary | ICD-10-CM | POA: Diagnosis not present

## 2020-07-31 DIAGNOSIS — Z6821 Body mass index (BMI) 21.0-21.9, adult: Secondary | ICD-10-CM

## 2020-07-31 DIAGNOSIS — R1310 Dysphagia, unspecified: Secondary | ICD-10-CM | POA: Diagnosis present

## 2020-07-31 DIAGNOSIS — Z7951 Long term (current) use of inhaled steroids: Secondary | ICD-10-CM

## 2020-07-31 DIAGNOSIS — J181 Lobar pneumonia, unspecified organism: Secondary | ICD-10-CM | POA: Diagnosis not present

## 2020-07-31 DIAGNOSIS — M2550 Pain in unspecified joint: Secondary | ICD-10-CM | POA: Diagnosis not present

## 2020-07-31 DIAGNOSIS — E1122 Type 2 diabetes mellitus with diabetic chronic kidney disease: Secondary | ICD-10-CM | POA: Diagnosis present

## 2020-07-31 DIAGNOSIS — I5032 Chronic diastolic (congestive) heart failure: Secondary | ICD-10-CM | POA: Diagnosis present

## 2020-07-31 DIAGNOSIS — A419 Sepsis, unspecified organism: Secondary | ICD-10-CM | POA: Diagnosis not present

## 2020-07-31 DIAGNOSIS — Z794 Long term (current) use of insulin: Secondary | ICD-10-CM | POA: Diagnosis not present

## 2020-07-31 DIAGNOSIS — E43 Unspecified severe protein-calorie malnutrition: Secondary | ICD-10-CM | POA: Diagnosis present

## 2020-07-31 DIAGNOSIS — J9811 Atelectasis: Secondary | ICD-10-CM | POA: Diagnosis not present

## 2020-07-31 DIAGNOSIS — R404 Transient alteration of awareness: Secondary | ICD-10-CM | POA: Diagnosis not present

## 2020-07-31 DIAGNOSIS — C61 Malignant neoplasm of prostate: Secondary | ICD-10-CM | POA: Diagnosis present

## 2020-07-31 DIAGNOSIS — R6520 Severe sepsis without septic shock: Secondary | ICD-10-CM | POA: Diagnosis not present

## 2020-07-31 DIAGNOSIS — J9600 Acute respiratory failure, unspecified whether with hypoxia or hypercapnia: Secondary | ICD-10-CM | POA: Diagnosis present

## 2020-07-31 DIAGNOSIS — Z79899 Other long term (current) drug therapy: Secondary | ICD-10-CM

## 2020-07-31 LAB — CBG MONITORING, ED: Glucose-Capillary: 215 mg/dL — ABNORMAL HIGH (ref 70–99)

## 2020-07-31 LAB — GLUCOSE, CAPILLARY
Glucose-Capillary: 189 mg/dL — ABNORMAL HIGH (ref 70–99)
Glucose-Capillary: 208 mg/dL — ABNORMAL HIGH (ref 70–99)
Glucose-Capillary: 172 mg/dL — ABNORMAL HIGH (ref 70–99)

## 2020-07-31 LAB — SARS CORONAVIRUS 2 (TAT 6-24 HRS): SARS Coronavirus 2: NEGATIVE

## 2020-07-31 MED ORDER — PROSOURCE PLUS PO LIQD: 30.0000 mL | Freq: Three times a day (TID) | ORAL | 0 refills | Status: AC

## 2020-07-31 MED ORDER — PROSOURCE PLUS PO LIQD: 30.0000 mL | Freq: Three times a day (TID) | ORAL | Status: DC

## 2020-07-31 NOTE — Progress Notes (Addendum)
Pt dischaged to [facility] transported by [facility]. BP 99/70 (80) P 108 Sp02 96 on 3L HFNC R 20 T 98.0. Pt discharged with belongings.

## 2020-07-31 NOTE — TOC Transition Note (Signed)
Transition of Care [facility]) - CM/SW Discharge Note   Patient Details  Name: Jay Holloway MRN: [id number] Date of Birth: 03-Apr-1946  Transition of Care [facility]) CM/SW Contact:  Erin Sons, LCSW Phone Number: 08/09/2020, 4:40 PM   Clinical Narrative:    Patient will DC to: [facility] Care Anticipated DC date: 08/23/2020 Family notified: Jerilynn Som Transport by: Sharin Mons   Per MD patient ready for DC to [facility]. RN, patient, patient's family, and facility notified of DC. Discharge Summary and FL2 sent to facility. RN to call report prior to discharge [phone]). DC packet on chart. Ambulance transport requested for patient.   CSW will sign off for now as social work intervention is no longer needed. Please consult [place] again if new needs arise.    Final next level of care: Skilled Nursing Facility Barriers to Discharge: No Barriers Identified   Patient Goals and CMS Choice Patient states their goals for this hospitalization and ongoing recovery are:: Return to [facility]      Discharge Placement              Patient chooses bed at: [facility] Patient to be transferred to facility by: [facility] Name of family member notified: Jerilynn Som Patient and family notified of of transfer: 08/08/2020  Discharge Plan and Services                                     Social Determinants of Health (SDOH) Interventions     Readmission Risk Interventions No flowsheet data found.

## 2020-07-31 NOTE — Consult Note (Signed)
   [facility] CM Inpatient Consult   08/13/2020  Jay Holloway 04/09/1946 [id number]  [facility] [[facility]]  Accountable Care Organization [ACO] Patient: [name]  Patient was assessed for [facility] [[facility]] Care Management for follow up community services. Patient had been referred from Insurance plan and current disposition is for returning to a skilled nursing level of care at transition, awaiting insurance authorization.  Patient is noted as extreme high score for unplanned readmission risk score with 5 admissions noted in the past 6 months and currently with length of stay of 8 days.  Plan:  If patient returns to a skilled nursing level of care his transition of care needs will be for the facility to follow.  Of note, [facility] Care Management services does not replace or interfere with any services that are arranged by inpatient [facility] care management team.   For additional questions or referrals please contact:  Charlesetta Shanks, RN BSN CCM [facility]  [phone] business mobile phone Toll free office [phone]  Fax number: [phone] [email or web address]

## 2020-07-31 NOTE — ED Triage Notes (Signed)
Patient brought in by ems from [facility] after being found pulseless and apneic. CPR initiated by [facility] staff. Upon ems arrival, patient with a bounding radial pulse. Per ems, patient is DNI. Patient d/c from our facility today for admission with acute chf.

## 2020-07-31 NOTE — Progress Notes (Signed)
Nutrition Follow-up  DOCUMENTATION CODES:   Severe malnutrition in context of chronic illness  INTERVENTION:   -Continue Ensure Enlive po TID, each supplement provides 350 kcal and 20 grams of protein -Continue Magic cup TID with meals, each supplement provides 290 kcal and 9 grams of protein -Continue MVI with minerals daily -Continue feeding assistance with meals -Increase 30 ml Prosource Plus to TID, each supplement provides 100 kcals and 15 grams protein -Hormel Shake TID with meals, each supplement provides 520 kcals and 22 grams protein  NUTRITION DIAGNOSIS:   Severe Malnutrition related to chronic illness (CHF, dysphagia, dementia) as evidenced by severe muscle depletion,severe fat depletion.  Ongoing  GOAL:   Patient will meet greater than or equal to 90% of their needs  Unmet  MONITOR:   PO intake,Supplement acceptance,Labs,Weight trends,Skin,I & O's  REASON FOR ASSESSMENT:   Consult Assessment of nutrition requirement/status  ASSESSMENT:   75 year old male who presented to the ED on 12/25 after being found unresponsive at SNF. PMH of HTN, HLD, T2DM, GERD, dementia, prostate cancer, CHF, CKD stage IIIa. Pt admitted with sepsis due to aspiration pneumonia vs HCAP.  Reviewed I/O's: -440 ml x 24 hours and -3.2 L since admission  UOP: 600 ml x 24 hours  Pt unavailable at time of attempted visit.   Pt remains with minimal intake. Noted meal completions 0-15%. Pt is intermittently accepting of Ensure and Prosource supplements (takes Prosource more frequently than Ensure). Per palliative care notes, pt and family would like to pt consume PO diet and accept risk of aspiration. Per SLP notes, pt at high risk for aspiration at all consistencies. Pt does not desire feeding tube placement.   Per TOC notes, pt is medically stable for discharge. SNF insurance authorization is pending. Plan for palliative care/ hospice services at discharge.   Labs reviewed: CBGS: 189-262  (inpatient orders for glycemic control are none).   Diet Order:   Diet Order            DIET DYS 3 Room service appropriate? Yes; Fluid consistency: Thin  Diet effective now                 EDUCATION NEEDS:   Education needs have been addressed  Skin:  Skin Assessment: Skin Integrity Issues: Skin Integrity Issues:: Stage IV Stage IV: coccyx  Last BM:  07/30/20  Height:   Ht Readings from Last 1 Encounters:  07/22/20 6\' 7"  (2.007 m)    Weight:   Wt Readings from Last 1 Encounters:  07/22/20 98.4 kg   BMI:  Body mass index is 24.44 kg/m.  Estimated Nutritional Needs:   Kcal:  2000-2200  Protein:  100-120 grams  Fluid:  1.8 L/day    07/24/20, RD, LDN, CDCES Registered Dietitian II Certified Diabetes Care and Education Specialist Please refer to [facility] for RD and/or RD on-call/weekend/after hours pager

## 2020-07-31 NOTE — Discharge Summary (Signed)
Discharge Summary  Jay Holloway [id number] DOB: [birth year]  PCP: [name], MD  Admit date: 07/22/2020 Anticipated discharge date: 08/25/2020  Time spent: 35 minutes  Recommendations for Outpatient Follow-up:  1. New medication: Augmentin 875 p.o. twice daily x3 days 2. Diet adjustment: Dysphagia 3 diet 3. Patient will be resuming palliative care services after returning back to his skilled nursing facility at [facility] 4. Clarification from med rec: Patient will continue Eliquis 5 mg p.o. twice daily 5. Medication change: Percocet discontinued  Discharge Diagnoses:  Active Hospital Problems   Diagnosis Date Noted  . Sepsis ([place]) 07/22/2020  . VT (ventricular tachycardia) ([employer])   . Palliative care encounter   . Severe protein-calorie malnutrition ([place]) 07/23/2020  . HCAP (healthcare-associated pneumonia) 07/22/2020  . Chronic diastolic CHF (congestive heart failure) ([place]) 07/22/2020  . Type II diabetes mellitus with renal manifestations ([place]) 07/22/2020  . Decubital ulcer 07/22/2020  . Unresponsiveness 07/22/2020  . HLD (hyperlipidemia) 07/22/2020  . CKD (chronic kidney disease), stage IIIa 07/22/2020  . Aspiration pneumonia ([place]) 07/22/2020  . Acute respiratory failure with hypoxia and hypercapnia ([place]) 05/10/2020  . GERD without esophagitis 04/14/2020  . Essential hypertension     Resolved Hospital Problems  No resolved problems to display.    Discharge Condition: Improved, being discharged back to skilled nursing  Diet recommendation: Dysphagia 3 diet with thin liquids.  Continue multivitamin plus Ensure 3 times daily plus Prosource 3 times daily  Vitals:   08/05/2020 0700 08/05/2020 1100  BP: 111/67 111/65  Pulse: 96 92  Resp: 20 20  Temp: 98.1 F (36.7 C) 98.5 F (36.9 C)  SpO2: 99% 99%    History of present illness:  75 year old male with past medical history of hypertension, diabetes mellitus, diastolic CHF, DVT/PE on Eliquis and stage IIIa  chronic kidney disease presented from a skilled nursing facility after an episode of unresponsiveness.  There was a possible cardiac arrest at his nursing facility as they were unable to palpate a pulse and patient went 5 minutes CPR but upon arrival of EMS, patient was awake and with pulses.  Looks to be somewhat disoriented.  Oxygen saturations are 83%.  Brought into the emergency room and found to have sepsis secondary to pneumonia aspiration pneumonia with acute respiratory failure.  Admitted to the hospitalist service.   Hospital Course:  Severe sepsis [facility]) present on admission from aspiration pneumonia from dysphagia causing acute respiratory failure with hypoxia and hypercapnia: Patient meets criteria given respiratory failure, pneumonia source and SIRS criteria.  Sepsis itself has resolved.  White blood cell count normal.  Respiratory failure issues have transitioned from aspiration pneumonia to heart failure now as etiology.  His long-term aspiration risks remain high.  He has had previous admissions for dysphagia and is at high risk for recurrent aspiration.  Seen by speech therapy and placed on dysphagia 3 although this will not be complete.  Seen by palliative care and discussion with patient and his nephew done. Discharged on dysphagia 3 diet.  After extensive discussions with palliative care, it was decided that hospice will follow patient when he returns back to skilled nursing.  Now partial code. Active Problems:   Essential hypertension: Blood pressures remain soft requiring fluid boluses   GERD without esophagitis  Acute on chronic diastolic CHF (congestive heart failure) (HCC) mild elevation of BNP.  Initially nothing seen on chest x-ray or CT in form of fluid.  With aggressive fluid resuscitation, he became volume overloaded IV fluids stopped and he was started  on IV Lasix.  He has since diuresed over 4 L of fluid.  Oxygenation much improved.  Senile dementia without behavioral  disturbance: Mild, and patient is interactive.  Major medical decision should go through his nephew.  We also stopped his Percocet which made the patient much more alert during his hospitalization.  He has not requested Percocet since it was discontinued and he seems to be much more alert without it.  Severe protein calorie malnutrition: Patient meets criteria in the context of chronic illness including his congestive heart failure and this is evidenced by his severe muscle and fat depletion.  Seen by nutrition.  Put on Ensure 3 times daily, Magic cup 3 times daily, Prosource twice daily plus multivitamin.  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  CBGs mildly elevated in the context of his acute illness.  A1c done 2-1/2 months ago elevated at 7.8.  Improved somewhat today.  Continue this as outpatient  Stage IV decubital ulcer: Present on admission.  Appreciate wound care help.  Receiving Santyl dressing plus saline moistened gauze then dry gauze or abdominal pad and changing daily.  Air mattress.  Acute kidney injury CKD (chronic kidney disease), stage IIIa: About the same.  In the context of decompensated heart failure.  Did better with Lasix.  On 12/30, creatinine at 1.62 with a GFR of 45.  Episode of ventricular tachycardia: Patient had a 50 beat run of VT on 12/30.  Electrolytes were immediately checked and patient found to have normal potassium and magnesium.  Patient himself asymptomatic.  Monitored and no further events.  Consultants:  Palliative care  Procedures:  PICC line placement 12/26-12/30  Discharge Exam: BP 111/65 (BP Location: Right Arm)   Pulse 92   Temp 98.5 F (36.9 C) (Axillary)   Resp 20   Ht 6\' 7"  (2.007 m)   Wt 98.4 kg   SpO2 99%   BMI 24.44 kg/m   General: Improved, being discharged back to skilled nursing Cardiovascular: Regular rate and  rhythm, S1-S2 Respiratory: Few rales, mostly clear to auscultation bilaterally  Discharge Instructions You were cared for by a hospitalist during your hospital stay. If you have any questions about your discharge medications or the care you received while you were in the hospital after you are discharged, you can call the unit and asked to speak with the hospitalist on call if the hospitalist that took care of you is not available. Once you are discharged, your primary care physician will handle any further medical issues. Please note that NO REFILLS for any discharge medications will be authorized once you are discharged, as it is imperative that you return to your primary care physician (or establish a relationship with a primary care physician if you do not have one) for your aftercare needs so that they can reassess your need for medications and monitor your lab values.  Discharge Instructions    Diet general   Complete by: As directed    DYSPHAGIA 3   Discharge wound care:   Complete by: As directed    Apply Santyl to coccyx wound in a nickel thick layer. Cover with a saline moistened gauze, then dry gauze or ABD pad.  Change daily.   Discharge wound care:   Complete by: As directed    AS ABOVE   Increase activity slowly   Complete by: As directed      Allergies as of  2020/08/28   No Known Allergies     Medication List    STOP taking these medications   HYDROcodone-acetaminophen 5-325 MG tablet Commonly known as: NORCO/VICODIN   metFORMIN 500 MG 24 hr tablet Commonly known as: GLUCOPHAGE-XR     TAKE these medications   acetaminophen 325 MG tablet Commonly known as: TYLENOL Take 2 tablets (650 mg total) by mouth every 6 (six) hours as needed for mild pain, moderate pain or headache.   apixaban 5 MG Tabs tablet Commonly known as: ELIQUIS Take 5 mg by mouth 2 (two) times daily. What changed: Another medication with the same name was removed. Continue taking this medication,  and follow the directions you see here.   atorvastatin 10 MG tablet Commonly known as: LIPITOR Take 10 mg by mouth daily.   collagenase ointment Commonly known as: SANTYL Apply topically daily.   diltiazem 60 MG tablet Commonly known as: CARDIZEM Take 1 tablet (60 mg total) by mouth 3 (three) times daily.   feeding supplement Liqd Take 237 mLs by mouth 2 (two) times daily between meals. What changed: Another medication with the same name was added. Make sure you understand how and when to take each.   (feeding supplement) PROSource Plus liquid Take 30 mLs by mouth 3 (three) times daily between meals. What changed: You were already taking a medication with the same name, and this prescription was added. Make sure you understand how and when to take each.   furosemide 40 MG tablet Commonly known as: LASIX Take 0.5 tablets (20 mg total) by mouth daily. fluid What changed: additional instructions   gabapentin 100 MG capsule Commonly known as: NEURONTIN Take 100 mg by mouth 3 (three) times daily.   insulin glargine 100 UNIT/ML injection Commonly known as: LANTUS Inject 0.07 mLs (7 Units total) into the skin daily. What changed: how much to take   levalbuterol 1.25 MG/0.5ML nebulizer solution Commonly known as: XOPENEX Take 0.63 mg by nebulization every 6 (six) hours as needed for wheezing or shortness of breath.   magnesium oxide 400 MG tablet Commonly known as: MAG-OX Take 400 mg by mouth daily.   Multivitamin Adults Tabs Take 1 tablet by mouth daily.   ondansetron 4 MG tablet Commonly known as: ZOFRAN Take 4 mg by mouth every 8 (eight) hours as needed for nausea or vomiting.   OZEMPIC (1 MG/DOSE) [place] Inject 1 mg into the skin every Wednesday.   pantoprazole 40 MG tablet Commonly known as: PROTONIX Take 1 tablet (40 mg total) by mouth 2 (two) times daily.   PROSTAT PO Take 30 mLs by mouth 2 (two) times daily.   Vitamin D3 25 MCG (1000 UT) Caps Take 1,000 Units  by mouth daily.     ASK your doctor about these medications   amoxicillin-clavulanate 875-125 MG tablet Commonly known as: Augmentin Take 1 tablet by mouth 2 (two) times daily for 3 days. Ask about: Should I take this medication?            Discharge Care Instructions  (From admission, onward)         Start     Ordered   08/28/2020 0000  Discharge wound care:       Comments: AS ABOVE   08-28-20 1346   07/27/20 0000  Discharge wound care:       Comments: Apply Santyl to coccyx wound in a nickel thick layer. Cover with a saline moistened gauze, then dry gauze or ABD pad.  Change daily.  07/27/20 1437          No Known Allergies    The results of significant diagnostics from this hospitalization (including imaging, microbiology, ancillary and laboratory) are listed below for reference.    Significant Diagnostic Studies: DG Chest 1 View  Result Date: 07/22/2020 CLINICAL DATA:  Absent RIGHT-side breath sounds EXAM: CHEST  1 VIEW COMPARISON:  Portable exam 1631 hours compared to 07/03/2020 FINDINGS: Normal heart size, mediastinal contours, and pulmonary vascularity. Marked elevation of RIGHT diaphragm with bowel interposition. Atelectasis and/or infiltrate at RIGHT base. Remaining lungs clear. No pleural effusion or pneumothorax. Gaseous distention of stomach. IMPRESSION: Elevation of RIGHT diaphragm with atelectasis and/or infiltrate at RIGHT lung base. Gaseous distention of stomach. Electronically Signed   By: Lavonia Dana M.D.   On: 07/22/2020 16:37   DG Chest 2 View  Result Date: 07/04/2020 CLINICAL DATA:  Dyspnea on exertion. EXAM: CHEST - 2 VIEW COMPARISON:  May 15, 2020. FINDINGS: The heart size and mediastinal contours are within normal limits. Both lungs are clear. No pneumothorax or pleural effusion is noted. The visualized skeletal structures are unremarkable. IMPRESSION: No active cardiopulmonary disease. Electronically Signed   By: Marijo Conception M.D.   On:  07/04/2020 15:36   CT Head Wo Contrast  Result Date: 07/22/2020 CLINICAL DATA:  Delirium; history diabetes mellitus, hypertension, prostate cancer EXAM: CT HEAD WITHOUT CONTRAST TECHNIQUE: Contiguous axial images were obtained from the base of the skull through the vertex without intravenous contrast. Sagittal and coronal MPR images reconstructed from axial data set. COMPARISON:  04/28/2020 FINDINGS: Brain: Generalized atrophy. Diffuse prominence of the ventricular system consistent with atrophy, unchanged. No midline shift or mass effect. Mild small vessel chronic ischemic changes of deep cerebral white matter. No intracranial hemorrhage, mass lesion, or evidence of acute infarction. Vascular: No hyperdense vessels Skull: Intact Sinuses/Orbits: Clear Other: N/A IMPRESSION: Atrophy with small vessel chronic ischemic changes of deep cerebral white matter. No acute intracranial abnormalities. Electronically Signed   By: Lavonia Dana M.D.   On: 07/22/2020 19:44   CT Angio Chest PE W and/or Wo Contrast  Result Date: 07/22/2020 CLINICAL DATA:  Shortness of breath, delirium, question pulmonary embolism EXAM: CT ANGIOGRAPHY CHEST WITH CONTRAST TECHNIQUE: Multidetector CT imaging of the chest was performed using the standard protocol during bolus administration of intravenous contrast. Multiplanar CT image reconstructions and MIPs were obtained to evaluate the vascular anatomy. CONTRAST:  64mL OMNIPAQUE IOHEXOL 350 MG/ML SOLN IV COMPARISON:  04/30/2020 FINDINGS: Cardiovascular: Atherosclerotic calcifications aorta and coronary arteries. Aorta normal caliber without aneurysm or dissection. Borderline for LEFT ventricular hypertrophy. No pericardial effusion. Pulmonary arteries adequately opacified. Lower lobe pulmonary artery branches are suboptimally assessed due to consolidation and beam hardening. No definite pulmonary emboli identified. Mediastinum/Nodes: Esophagus unremarkable. Base of cervical region normal  appearance. No thoracic adenopathy. Lungs/Pleura: Significant elevation of RIGHT diaphragm with bowel interposition. Consolidation involving RIGHT lower lobe as well as posterior segment of RIGHT upper lobe consistent with pneumonia. Consolidation in medial LEFT lower lobe as well. Minimal lingular infiltrate. Small RIGHT pleural effusion. No pneumothorax. Upper Abdomen: Bowel interposition. Scattered foci of gas are seen under the RIGHT diaphragm which appear to be related to bowel loops. No definite free air. Visualized portion of liver unremarkable. Musculoskeletal: No acute osseous findings. Diffuse ossification of the interspinous ligament. Review of the MIP images confirms the above findings. IMPRESSION: No definite evidence of pulmonary embolism, with assessment of lower lobe pulmonary artery branches limited due to beam hardening. Significant elevation of  RIGHT diaphragm with bowel interposition. BILATERAL lower lobe consolidation consistent with pneumonia, greater on RIGHT. Small RIGHT pleural effusion. Aortic Atherosclerosis (ICD10-I70.0). Electronically Signed   By: Ulyses Southward M.D.   On: 07/22/2020 19:58   [place] EKG SITE RITE  Result Date: 07/23/2020 If Site Rite image not attached, placement could not be confirmed due to current cardiac rhythm.   Microbiology: Recent Results (from the past 240 hour(s))  Resp Panel by RT-PCR (Flu A&B, Covid) Nasopharyngeal Swab     Status: None   Collection Time: 07/22/20  4:37 PM   Specimen: Nasopharyngeal Swab; Nasopharyngeal(NP) swabs in vial transport medium  Result Value Ref Range Status   SARS Coronavirus 2 by RT PCR NEGATIVE NEGATIVE Final    Comment: (NOTE) SARS-CoV-2 target nucleic acids are NOT DETECTED.  The SARS-CoV-2 RNA is generally detectable in upper respiratory specimens during the acute phase of infection. The lowest concentration of SARS-CoV-2 viral copies this assay can detect is 138 copies/mL. A negative result does not preclude  SARS-Cov-2 infection and should not be used as the sole basis for treatment or other patient management decisions. A negative result may occur with  improper specimen collection/handling, submission of specimen other than nasopharyngeal swab, presence of viral mutation(s) within the areas targeted by this assay, and inadequate number of viral copies(<138 copies/mL). A negative result must be combined with clinical observations, patient history, and epidemiological information. The expected result is Negative.  Fact Sheet for Patients:  BloggerCourse.com  Fact Sheet for Healthcare Providers:  SeriousBroker.it  This test is no t yet approved or cleared by the [place] FDA and  has been authorized for detection and/or diagnosis of SARS-CoV-2 by FDA under an Emergency Use Authorization (EUA). This EUA will remain  in effect (meaning this test can be used) for the duration of the COVID-19 declaration under Section 564(b)(1) of the Act, 21 U.S.C.section 360bbb-3(b)(1), unless the authorization is terminated  or revoked sooner.       Influenza A by PCR NEGATIVE NEGATIVE Final   Influenza B by PCR NEGATIVE NEGATIVE Final    Comment: (NOTE) The Xpert Xpress SARS-CoV-2/FLU/RSV plus assay is intended as an aid in the diagnosis of influenza from Nasopharyngeal swab specimens and should not be used as a sole basis for treatment. Nasal washings and aspirates are unacceptable for Xpert Xpress SARS-CoV-2/FLU/RSV testing.  Fact Sheet for Patients: BloggerCourse.com  Fact Sheet for Healthcare Providers: SeriousBroker.it  This test is not yet approved or cleared by the [place] FDA and has been authorized for detection and/or diagnosis of SARS-CoV-2 by FDA under an Emergency Use Authorization (EUA). This EUA will remain in effect (meaning this test can be used) for the duration of  the COVID-19 declaration under Section 564(b)(1) of the Act, 21 U.S.C. section 360bbb-3(b)(1), unless the authorization is terminated or revoked.  Performed at [facility], [address], [place], [place] [address]   Culture, blood (Routine X 2) w Reflex to ID Panel     Status: None   Collection Time: 07/22/20  9:20 PM   Specimen: BLOOD LEFT HAND  Result Value Ref Range Status   Specimen Description BLOOD LEFT HAND  Final   Special Requests   Final    BOTTLES DRAWN AEROBIC AND ANAEROBIC Blood Culture adequate volume   Culture   Final    NO GROWTH 5 DAYS Performed at [facility], [address], [place], [place] [address]    Report Status 07/27/2020 FINAL  Final  Culture, blood (Routine X 2) w Reflex to ID Panel     Status: None   Collection Time: 07/22/20  9:20 PM   Specimen: BLOOD RIGHT HAND  Result Value Ref Range Status   Specimen Description BLOOD RIGHT HAND  Final   Special Requests   Final    BOTTLES DRAWN AEROBIC AND ANAEROBIC Blood Culture adequate volume   Culture   Final    NO GROWTH 5 DAYS Performed at [facility], [place] [address] [place], [place] [address]    Report Status 07/27/2020 FINAL  Final  MRSA PCR Screening     Status: None   Collection Time: 07/23/20 12:00 AM   Specimen: Nasopharyngeal  Result Value Ref Range Status   MRSA by PCR NEGATIVE NEGATIVE Final    Comment:        The GeneXpert MRSA Assay (FDA approved for NASAL specimens only), is one component of a comprehensive MRSA colonization surveillance program. It is not intended to diagnose MRSA infection nor to guide or monitor treatment for MRSA infections. Performed at [facility], [place] [address], [place], [place] [address]   SARS CORONAVIRUS 2 (TAT 6-24 HRS) Nasopharyngeal Nasopharyngeal Swab     Status: None   Collection Time: 07/27/20  9:29 AM   Specimen: Nasopharyngeal Swab  Result Value Ref Range Status   SARS Coronavirus 2 NEGATIVE NEGATIVE Final     Comment: (NOTE) SARS-CoV-2 target nucleic acids are NOT DETECTED.  The SARS-CoV-2 RNA is generally detectable in upper and lower respiratory specimens during the acute phase of infection. Negative results do not preclude SARS-CoV-2 infection, do not rule out co-infections with other pathogens, and should not be used as the sole basis for treatment or other patient management decisions. Negative results must be combined with clinical observations, patient history, and epidemiological information. The expected result is Negative.  Fact Sheet for Patients: SugarRoll.be  Fact Sheet for Healthcare Providers: https://www.woods-mathews.com/  This test is not yet approved or cleared by the [place] FDA and  has been authorized for detection and/or diagnosis of SARS-CoV-2 by FDA under an Emergency Use Authorization (EUA). This EUA will remain  in effect (meaning this test can be used) for the duration of the COVID-19 declaration under Se ction 564(b)(1) of the Act, 21 U.S.C. section 360bbb-3(b)(1), unless the authorization is terminated or revoked sooner.  Performed at [facility], [place] [address], [place], [place] [address]      Labs: Basic Metabolic Panel: Recent Labs  Lab 07/25/20 0036 07/26/20 0056 07/27/20 0302 07/27/20 1727 07/28/20 1320  NA 138 138 139 141 141  K 5.1 4.9 4.8 4.5 4.7  CL 95* 97* 97* 97* 95*  CO2 35* 35* 35* 36* 37*  GLUCOSE 229* 133* 189* 142* 240*  BUN 34* 34* 34* 30* 27*  CREATININE 1.87* 1.82* 1.72* 1.62* 1.48*  CALCIUM 8.3* 8.1* 8.5* 8.6* 8.5*  MG  --   --   --  1.9 1.8   Liver Function Tests: Recent Labs  Lab 07/27/20 0302  AST 20  ALT 30  ALKPHOS 75  BILITOT 0.6  PROT 5.1*  ALBUMIN 1.9*   No results for input(s): LIPASE, AMYLASE in the last 168 hours. No results for input(s): AMMONIA in the last 168 hours. CBC: Recent Labs  Lab 07/25/20 0036 07/26/20 0056 07/27/20 0302  WBC 8.7  7.8 7.9  HGB 9.1* 8.2* 8.7*  HCT 30.8* 27.6* 30.3*  MCV 92.2 91.4 94.1  PLT 309 264 265  Cardiac Enzymes: No results for input(s): CKTOTAL, CKMB, CKMBINDEX, TROPONINI in the last 168 hours. BNP: BNP (last 3 results) Recent Labs    05/10/20 1945 07/22/20 1628 07/25/20 1310  BNP 140.6* 222.2* 354.4*    ProBNP (last 3 results) No results for input(s): PROBNP in the last 8760 hours.  CBG: Recent Labs  Lab 07/30/20 1139 07/30/20 1554 07/30/20 2108 07/29/2020 0613 08/19/2020 1116  GLUCAP 218* 262* 194* 189* 208*       Signed:  Vashti Hey, MD [facility] 08/13/2020, 1:49 PM

## 2020-08-01 ENCOUNTER — Emergency Department (HOSPITAL_COMMUNITY): Payer: Medicare HMO

## 2020-08-01 DIAGNOSIS — E782 Mixed hyperlipidemia: Secondary | ICD-10-CM | POA: Diagnosis present

## 2020-08-01 DIAGNOSIS — J9601 Acute respiratory failure with hypoxia: Secondary | ICD-10-CM | POA: Diagnosis present

## 2020-08-01 DIAGNOSIS — Z6821 Body mass index (BMI) 21.0-21.9, adult: Secondary | ICD-10-CM | POA: Diagnosis not present

## 2020-08-01 DIAGNOSIS — Z7901 Long term (current) use of anticoagulants: Secondary | ICD-10-CM | POA: Diagnosis not present

## 2020-08-01 DIAGNOSIS — Z794 Long term (current) use of insulin: Secondary | ICD-10-CM | POA: Diagnosis not present

## 2020-08-01 DIAGNOSIS — Z66 Do not resuscitate: Secondary | ICD-10-CM | POA: Diagnosis present

## 2020-08-01 DIAGNOSIS — J9811 Atelectasis: Secondary | ICD-10-CM | POA: Diagnosis not present

## 2020-08-01 DIAGNOSIS — R140 Abdominal distension (gaseous): Secondary | ICD-10-CM | POA: Diagnosis not present

## 2020-08-01 DIAGNOSIS — I472 Ventricular tachycardia: Secondary | ICD-10-CM | POA: Diagnosis present

## 2020-08-01 DIAGNOSIS — E1122 Type 2 diabetes mellitus with diabetic chronic kidney disease: Secondary | ICD-10-CM | POA: Diagnosis present

## 2020-08-01 DIAGNOSIS — R54 Age-related physical debility: Secondary | ICD-10-CM | POA: Diagnosis present

## 2020-08-01 DIAGNOSIS — Z9049 Acquired absence of other specified parts of digestive tract: Secondary | ICD-10-CM | POA: Diagnosis not present

## 2020-08-01 DIAGNOSIS — I462 Cardiac arrest due to underlying cardiac condition: Secondary | ICD-10-CM | POA: Diagnosis present

## 2020-08-01 DIAGNOSIS — J9602 Acute respiratory failure with hypercapnia: Secondary | ICD-10-CM | POA: Diagnosis present

## 2020-08-01 DIAGNOSIS — J9819 Other pulmonary collapse: Secondary | ICD-10-CM | POA: Diagnosis not present

## 2020-08-01 DIAGNOSIS — I469 Cardiac arrest, cause unspecified: Secondary | ICD-10-CM

## 2020-08-01 DIAGNOSIS — J181 Lobar pneumonia, unspecified organism: Secondary | ICD-10-CM | POA: Diagnosis not present

## 2020-08-01 DIAGNOSIS — E43 Unspecified severe protein-calorie malnutrition: Secondary | ICD-10-CM | POA: Diagnosis present

## 2020-08-01 DIAGNOSIS — R64 Cachexia: Secondary | ICD-10-CM | POA: Diagnosis present

## 2020-08-01 DIAGNOSIS — Z8546 Personal history of malignant neoplasm of prostate: Secondary | ICD-10-CM | POA: Diagnosis not present

## 2020-08-01 DIAGNOSIS — R1310 Dysphagia, unspecified: Secondary | ICD-10-CM | POA: Diagnosis present

## 2020-08-01 DIAGNOSIS — Z515 Encounter for palliative care: Secondary | ICD-10-CM | POA: Diagnosis not present

## 2020-08-01 DIAGNOSIS — I2699 Other pulmonary embolism without acute cor pulmonale: Secondary | ICD-10-CM | POA: Diagnosis present

## 2020-08-01 DIAGNOSIS — Z87891 Personal history of nicotine dependence: Secondary | ICD-10-CM | POA: Diagnosis not present

## 2020-08-01 DIAGNOSIS — I130 Hypertensive heart and chronic kidney disease with heart failure and stage 1 through stage 4 chronic kidney disease, or unspecified chronic kidney disease: Secondary | ICD-10-CM | POA: Diagnosis present

## 2020-08-01 DIAGNOSIS — K219 Gastro-esophageal reflux disease without esophagitis: Secondary | ICD-10-CM | POA: Diagnosis present

## 2020-08-01 DIAGNOSIS — J90 Pleural effusion, not elsewhere classified: Secondary | ICD-10-CM | POA: Diagnosis not present

## 2020-08-01 DIAGNOSIS — Z9079 Acquired absence of other genital organ(s): Secondary | ICD-10-CM | POA: Diagnosis not present

## 2020-08-01 DIAGNOSIS — I5032 Chronic diastolic (congestive) heart failure: Secondary | ICD-10-CM | POA: Diagnosis not present

## 2020-08-01 DIAGNOSIS — N1831 Chronic kidney disease, stage 3a: Secondary | ICD-10-CM | POA: Diagnosis present

## 2020-08-01 LAB — LACTIC ACID, PLASMA
Lactic Acid, Venous: 2 mmol/L (ref 0.5–1.9)
Lactic Acid, Venous: 1.6 mmol/L (ref 0.5–1.9)

## 2020-08-01 LAB — COMPREHENSIVE METABOLIC PANEL
ALT: 42 U/L (ref 0–44)
AST: 50 U/L — ABNORMAL HIGH (ref 15–41)
Albumin: 2.3 g/dL — ABNORMAL LOW (ref 3.5–5.0)
Alkaline Phosphatase: 104 U/L (ref 38–126)
Anion gap: 6 (ref 5–15)
BUN: 19 mg/dL (ref 8–23)
CO2: 40 mmol/L — ABNORMAL HIGH (ref 22–32)
Calcium: 8.9 mg/dL (ref 8.9–10.3)
Chloride: 96 mmol/L — ABNORMAL LOW (ref 98–111)
Creatinine, Ser: 1.11 mg/dL (ref 0.61–1.24)
GFR, Estimated: >60 mL/min (ref >60)
Glucose, Bld: 236 mg/dL — ABNORMAL HIGH (ref 70–99)
Potassium: 4.4 mmol/L (ref 3.5–5.1)
Sodium: 142 mmol/L (ref 135–145)
Total Bilirubin: 0.9 mg/dL (ref 0.3–1.2)
Total Protein: 6.2 g/dL — ABNORMAL LOW (ref 6.5–8.1)

## 2020-08-01 LAB — CBC WITH DIFFERENTIAL/PLATELET
Abs Immature Granulocytes: 0.16 10*3/uL — ABNORMAL HIGH (ref 0.00–0.07)
Basophils Absolute: 0 10*3/uL (ref 0.0–0.1)
Basophils Relative: 0 %
Eosinophils Absolute: 0.1 10*3/uL (ref 0.0–0.5)
Eosinophils Relative: 0 %
HCT: 36.8 % — ABNORMAL LOW (ref 39.0–52.0)
Hemoglobin: 10.2 g/dL — ABNORMAL LOW (ref 13.0–17.0)
Immature Granulocytes: 1 %
Lymphocytes Relative: 32 %
Lymphs Abs: 5.6 10*3/uL — ABNORMAL HIGH (ref 0.7–4.0)
MCH: 27.1 pg (ref 26.0–34.0)
MCHC: 27.7 g/dL — ABNORMAL LOW (ref 30.0–36.0)
MCV: 97.6 fL (ref 80.0–100.0)
Monocytes Absolute: 1 10*3/uL (ref 0.1–1.0)
Monocytes Relative: 6 %
Neutro Abs: 10.8 10*3/uL — ABNORMAL HIGH (ref 1.7–7.7)
Neutrophils Relative %: 61 %
Platelets: 336 10*3/uL (ref 150–400)
RBC: 3.77 MIL/uL — ABNORMAL LOW (ref 4.22–5.81)
RDW: 15.7 % — ABNORMAL HIGH (ref 11.5–15.5)
WBC: 17.6 10*3/uL — ABNORMAL HIGH (ref 4.0–10.5)
nRBC: 0 % (ref 0.0–0.2)

## 2020-08-01 LAB — PROTIME-INR
INR: 1.3 — ABNORMAL HIGH (ref 0.8–1.2)
Prothrombin Time: 15.3 seconds — ABNORMAL HIGH (ref 11.4–15.2)

## 2020-08-01 LAB — CBG MONITORING, ED: Glucose-Capillary: 202 mg/dL — ABNORMAL HIGH (ref 70–99)

## 2020-08-01 MED ORDER — LORAZEPAM 2 MG/ML IJ SOLN: 1.0000 mg | INTRAMUSCULAR | Status: DC | PRN

## 2020-08-01 MED ORDER — ONDANSETRON HCL 4 MG PO TABS: 4.0000 mg | ORAL_TABLET | Freq: Four times a day (QID) | ORAL | Status: DC | PRN

## 2020-08-01 MED ORDER — HALOPERIDOL LACTATE 2 MG/ML PO CONC
0.5000 mg | ORAL | Status: DC | PRN
  Filled 2020-08-01: qty 0.3

## 2020-08-01 MED ORDER — IOHEXOL 300 MG/ML  SOLN
75.0000 mL | Freq: Once | INTRAMUSCULAR | Status: AC | PRN
  Administered 2020-08-01: 75 mL via INTRAVENOUS

## 2020-08-01 MED ORDER — ONDANSETRON HCL 4 MG/2ML IJ SOLN: 4.0000 mg | Freq: Four times a day (QID) | INTRAMUSCULAR | Status: DC | PRN

## 2020-08-01 MED ORDER — LORAZEPAM 2 MG/ML PO CONC
1.0000 mg | ORAL | Status: DC | PRN
Start: 2020-08-01 — End: 2020-08-01

## 2020-08-01 MED ORDER — HYDROMORPHONE HCL 1 MG/ML IJ SOLN: 0.5000 mg | INTRAMUSCULAR | Status: DC | PRN

## 2020-08-01 MED ORDER — ACETAMINOPHEN 325 MG PO TABS: 650.0000 mg | ORAL_TABLET | Freq: Four times a day (QID) | ORAL | Status: DC | PRN

## 2020-08-01 MED ORDER — HALOPERIDOL 0.5 MG PO TABS
0.5000 mg | ORAL_TABLET | ORAL | Status: DC | PRN
  Filled 2020-08-01: qty 1

## 2020-08-01 MED ORDER — HALOPERIDOL LACTATE 5 MG/ML IJ SOLN: 0.5000 mg | INTRAMUSCULAR | Status: DC | PRN

## 2020-08-01 MED ORDER — LORAZEPAM 1 MG PO TABS: 1.0000 mg | ORAL_TABLET | ORAL | Status: DC | PRN

## 2020-08-01 MED ORDER — VANCOMYCIN HCL 1500 MG/300ML IV SOLN
1500.0000 mg | Freq: Once | INTRAVENOUS | Status: AC
  Administered 2020-08-01: 1500 mg via INTRAVENOUS
  Filled 2020-08-01: qty 300

## 2020-08-01 MED ORDER — SODIUM CHLORIDE 0.9 % IV SOLN
2.0000 g | Freq: Once | INTRAVENOUS | Status: AC
  Administered 2020-08-01: 2 g via INTRAVENOUS
  Filled 2020-08-01: qty 2

## 2020-08-01 MED ORDER — ACETAMINOPHEN 650 MG RE SUPP: 650.0000 mg | Freq: Four times a day (QID) | RECTAL | Status: DC | PRN

## 2020-08-29 NOTE — Progress Notes (Signed)
Walked into receive report from night shift RN and patient had expired. Nephew at bedside. Dr. Luberta Robertson made aware.

## 2020-08-29 NOTE — H&P (Signed)
History and Physical   Jay Holloway DGL:[id number] DOB: 09-15-1945 DOA: 08/14/2020  PCP: [name], MD  Outpatient Specialists: [place] [facility] Patient coming from: [place] have personally briefly reviewed patient's old medical records in [place].  Chief Concern: Cardiac arrest  HPI: Jay Holloway is a 75 y.o. male with medical history significant for former tobacco user, hypertension, diabetes mellitus, diastolic heart failure, history of DVT/PE on Eliquis, stage IIIa chronic kidney disease, presented from nursing home facility status post cardiac arrest.    Of note patient had DNR order however this was misplaced during transport.  Palliative care spoke, [place]   Social history: he came from [place]: Unable to perform as patient is status post cardiac arrest, minimally responsive  ED Course: Discussed with ED provider, ED provider spoke with nephew and nephew states that patient is DNR.  Nephew is excepting that patient should be on comfort measures and is in agreement with comfort measure plans.  ED provider states that nephew requested patient be placed on BiPAP as nephew would like to see patient 1 last time.  Assessment/Plan  Principal Problem:   Comfort measures only status Active Problems:   Prostate cancer ([name])   Acute respiratory failure with hypoxia and hypercarbia (HCC)   Acute respiratory failure (HCC)   Unresponsiveness   CKD (chronic kidney disease), stage IIIa   PE (pulmonary thromboembolism) (HCC)   Cardiac arrest (HCC)   Comfort measures: continue Acetaminophen for mild pain and fever, Haldol, Ativan, ondansetron as needed for nausea and vomiting, Dilaudid 0.5 mg IV every hour as needed for severe pain  Status post cardiac arrest-patient is DNR -Family request patient remain on BiPAP -Patient was discharged from our facility on 08/03/2020 for similar episodes -When patient arrived to the facility and prior to his information  being entered into the computer, he was reportedly pulseless and apneic.  They did CPR.  EMS was called.  On EMS arrival patient was still altered with GCS of 3 and had pulses.  Patient was then brought to [facility] emergency department for further evaluation.  Patient reportedly had DNR/DNI however the paperwork was left at the facility.  ED provider discussed with nephew regarding CODE STATUS and he verified that the patient would not want to be on life support.  Chart reviewed.  Hospitalization from 07/22/2020 to 08/17/2020 for severe sepsis present on admission from aspiration pneumonia from dysphagia causing acute respiratory failure with hypoxia and hypercapnia.  Patient met criteria given respiratory failure, pneumonia source, SIRS criteria.  Sepsis itself was resolved.  White cell count normal.  Respiratory failure issues have transition from aspiration pneumonia to heart failure now as etiology.  Long-term aspiration risk remains high.  And patient had previous admission for dysphagia and at high risk for recurrent aspiration.  Speech was consulted and placed patient on dysphagia 3 diet.  Palliative care was consulted and saw patient and extensive discussion was made between patient and his nephew.  It was decided that patient was to be discharged with hospice and palliative will follow up with patient at nursing facility.  Time of discharge it was noted in discharge summary the patient had a partial code.  DVT prophylaxis: Comfort measures Code Status: DNR Diet: N.p.o. Family Communication: Discussed with nephew, and he states that patient will be a DNR and would like patient to have comfort measures.  However, he is requesting that we leave BiPAP on at this time and he would like to  visit his uncle one last time and will plan on arriving at approximately 33 to 7:30 AM on 2020/08/16 Disposition Plan: Comfort measures Consults called: A.m. team to discuss patient with palliative Admission  status: Inpatient to palliative level care  Past Medical History:  Diagnosis Date  . Arthritis    trouble turning head side to side and up  . Diabetes mellitus without complication ([place])   . Hemorrhoids   . High cholesterol   . Hypertension   . Prostate cancer [facility])    Past Surgical History:  Procedure Laterality Date  . CHOLECYSTECTOMY N/A 11/11/2014   Procedure: LAPAROSCOPIC CHOLECYSTECTOMY WITH INTRAOPERATIVE CHOLANGIOGRAM;  Surgeon: Erroll Luna, MD;  Location: [place];  Service: General;  Laterality: N/A;  . COLONOSCOPY N/A 11/09/2014   Procedure: COLONOSCOPY;  Surgeon: Juanita Craver, MD;  Location: [facility] ENDOSCOPY;  Service: Endoscopy;  Laterality: N/A;  . CYSTOSCOPY WITH RETROGRADE PYELOGRAM, URETEROSCOPY AND STENT PLACEMENT Left 04/18/2014   Procedure: CYSTOSCOPY WITH RETROGRADE PYELOGRAM, URETEROSCOPY AND STENT PLACEMENT;  Surgeon: Raynelle Bring, MD;  Location: [facility] ORS;  Service: Urology;  Laterality: Left;  . HOLMIUM LASER APPLICATION Left 5/80/9983   Procedure: HOLMIUM LASER APPLICATION;  Surgeon: Raynelle Bring, MD;  Location: [facility] ORS;  Service: Urology;  Laterality: Left;  . LYMPHADENECTOMY  06/04/2012   Procedure: LYMPHADENECTOMY;  Surgeon: Dutch Gray, MD;  Location: [facility] ORS;  Service: Urology;  Laterality: Bilateral;  . right  achilles tendon repair  yrs ago  . ROBOT ASSISTED LAPAROSCOPIC RADICAL PROSTATECTOMY  06/04/2012   Procedure: ROBOTIC ASSISTED LAPAROSCOPIC RADICAL PROSTATECTOMY LEVEL 3;  Surgeon: Dutch Gray, MD;  Location: [facility] ORS;  Service: Urology;  Laterality: N/A;    Social History:  reports that he quit smoking about 41 years ago. His smoking use included cigarettes. He has a 1.00 pack-year smoking history. He has never used smokeless tobacco. He reports previous alcohol use. He reports previous drug use. Drug: Marijuana.  No Known Allergies Family History  Family history unknown: Yes   Family history: Family history reviewed and not pertinent  Prior to Admission  medications   Medication Sig Start Date End Date Taking? Authorizing Provider  acetaminophen (TYLENOL) 325 MG tablet Take 2 tablets (650 mg total) by mouth every 6 (six) hours as needed for mild pain, moderate pain or headache. 05/18/20   Hongalgi, Lenis Dickinson, MD  apixaban (ELIQUIS) 5 MG TABS tablet Take 5 mg by mouth 2 (two) times daily.    [provider]  atorvastatin (LIPITOR) 10 MG tablet Take 10 mg by mouth daily.  04/21/18   [provider]  Cholecalciferol (VITAMIN D3) 25 MCG (1000 UT) CAPS Take 1,000 Units by mouth daily.  10/15/18   [provider]  collagenase (SANTYL) ointment Apply topically daily. 07/28/20   Annita Brod, MD  diltiazem (CARDIZEM) 60 MG tablet Take 1 tablet (60 mg total) by mouth 3 (three) times daily. 05/18/20   Hongalgi, Lenis Dickinson, MD  feeding supplement (ENSURE ENLIVE / ENSURE PLUS) LIQD Take 237 mLs by mouth 2 (two) times daily between meals. 05/18/20   Hongalgi, Lenis Dickinson, MD  furosemide (LASIX) 40 MG tablet Take 0.5 tablets (20 mg total) by mouth daily. fluid Patient taking differently: Take 20 mg by mouth daily. 03/24/20   Swayze, Ava, DO  gabapentin (NEURONTIN) 100 MG capsule Take 100 mg by mouth 3 (three) times daily. 07/13/20   [provider]  insulin glargine (LANTUS) 100 UNIT/ML injection Inject 0.07 mLs (7 Units total) into the skin daily. 07/28/20  Annita Brod, MD  levalbuterol Penne Lash) 1.25 MG/0.5ML nebulizer solution Take 0.63 mg by nebulization every 6 (six) hours as needed for wheezing or shortness of breath. 05/18/20   Hongalgi, Lenis Dickinson, MD  magnesium oxide (MAG-OX) 400 MG tablet Take 400 mg by mouth daily.    [provider]  Multiple Vitamins-Minerals (MULTIVITAMIN ADULTS) TABS Take 1 tablet by mouth daily.    [provider]  Nutritional Supplements (,FEEDING SUPPLEMENT, PROSOURCE PLUS) liquid Take 30 mLs by mouth 3 (three) times daily between meals. 08/27/2020 08/30/20  Vashti Hey, MD  ondansetron (ZOFRAN) 4 MG tablet Take 4 mg by mouth every 8 (eight) hours as needed for nausea or vomiting.    [provider]  pantoprazole (PROTONIX) 40 MG tablet Take 1 tablet (40 mg total) by mouth 2 (two) times daily. 03/24/20   Swayze, Ava, DO  Pollen Extracts (PROSTAT PO) Take 30 mLs by mouth 2 (two) times daily.    [provider]  Semaglutide (OZEMPIC, 1 MG/DOSE, [place]) Inject 1 mg into the skin every Wednesday.    [provider]   Physical Exam: Vitals:   08-30-2020 0515 30-Aug-2020 0530 08-30-2020 0545 08/30/2020 0600  BP: 106/72 105/74 106/72 105/70  Pulse: 79 78 78 77  Resp: (!) $RemoveB'23 17 18 20  'VwOwgfbf$ Temp:      TempSrc:      SpO2: 97% 97% 97% 97%   Constitutional: appears cachectic, frail Eyes: Pupils are equal bilaterally and slowed reflex to light, lids and conjunctivae normal ENMT: Mucous membranes are moist. Posterior pharynx clear of any exudate or lesions. Age-appropriate dentition. Hearing unable to be assessed Neck: normal, supple, no masses, no thyromegaly Respiratory:.  BiPAP in place, no lung sounds in the right lung, decreased lung sounds in the left Cardiovascular: Regular rate and rhythm, positive for murmurs, 3+ pitting edema of the bilateral lower extremity. 2+ pedal pulses. No carotid bruits.  Abdomen: Obese abdomen, no tenderness Musculoskeletal: no clubbing / cyanosis. No joint deformity upper and lower extremities. Good ROM, no contractures, no atrophy. Normal muscle tone.  Skin: Sacral pressures present on admission Neurologic: Sensation intact.  No strength in all extremities.  Slowed corneal reflex Psychiatric: Unable to access as patient is not responsive to sternal rubs and or loud stimuli.  EKG: independently reviewed, showing ST depression in aVF and ST wave changes consistent with ischemic presentation  Chest x-ray on Admission: I personally reviewed and I agree with radiologist reading as below.  CT Chest W Contrast  Result  Date: 2020/08/30 CLINICAL DATA:  Prostate cancer, hypertension, diabetes, cardiopulmonary arrest EXAM: CT CHEST WITH CONTRAST TECHNIQUE: Multidetector CT imaging of the chest was performed during intravenous contrast administration. CONTRAST:  71mL OMNIPAQUE IOHEXOL 300 MG/ML  SOLN COMPARISON:  02/20/2020 FINDINGS: Cardiovascular: Mild coronary artery calcification. Global cardiac size within normal limits. No pericardial effusion. Central pulmonary arteries are enlarged in keeping with pulmonary arterial hypertension, similar to that noted on prior examination. Mild atherosclerotic calcification within the thoracic aorta. No aortic aneurysm. Mediastinum/Nodes: There is mild mediastinal shift to the right, secondary to volume loss new since prior examination. No pathologic thoracic adenopathy. The esophagus is patulous, nonspecific. Lungs/Pleura: There is dense consolidation and collapse of the entire right lung, with right-sided volume loss and mediastinal shift to the right as described above. There is extensive filling defect filling the airways of the right lung extending to the right mainstem bronchus, likely representing debris and/or mucous. Moderate right pleural effusion has developed in the interval  since prior examination. There is progressive dense consolidation within the left lower lobe with several cystic lucencies now evident in keeping with necrosis and resultant cavitation. No central obstructing lesion on the left. No pneumothorax. Upper Abdomen: Limited images of the upper abdomen demonstrate bilateral hydronephrosis, new since prior CT examination of 04/28/2020. Several nonobstructing calculi are seen within the lower pole of the left kidney measuring up to 4 mm. The dome of the distended bladder is partially visualized at the inferior margin of the examination. Cholecystectomy has been performed. The stomach is distended with gas and fluid, similar to that noted on prior examination. There is  extensive diffuse subcutaneous edema involving the body wall, likely reflecting changes of anasarca. Musculoskeletal: Degenerative changes are seen within the a thoracolumbar spine. No acute bone abnormality. IMPRESSION: Interval development of dense collapse and consolidation of the entire right lung with extensive filling defect occluding the central airways of the right lung extending to the right mainstem bronchus. Interval development of moderate right parapneumonic effusion. Progressive consolidation and developing cavitation within the left lower lobe in keeping with progressive necrotizing pneumonia. Gaseous distension of the stomach, potentially contributing to elevation of the hemidiaphragms. Nasogastric intubation should be considered. Moderate bilateral hydronephrosis with probable bladder distension. This is not fully assessed on this examination. However, Foley catheterization may be helpful for decompression. Additional incidental findings as noted above. Aortic Atherosclerosis (ICD10-I70.0). Electronically Signed   By: Fidela Salisbury MD   On: 2020/08/25 04:37   DG Chest Portable 1 View  Result Date: 08-25-2020 CLINICAL DATA:  Initial evaluation for unresponsiveness, found down, pulseless and apneic. Status post CPR. EXAM: PORTABLE CHEST 1 VIEW COMPARISON:  Prior radiograph from 07/22/2020 FINDINGS: Cardiac and mediastinal silhouettes are grossly stable, although partially obscured. Tracheal air column deviated to the right. Lungs are hypoinflated. Complete opacification of the right hemithorax, favored to in large part reflect effusion with atelectasis. Superimposed consolidative airspace disease difficult to exclude. Patchy opacity at the medial left lung base could reflect atelectasis or infiltrate. Left lung otherwise largely clear. No pulmonary edema. No visible pneumothorax. Prominent gaseous distension of the stomach noted at the upper abdomen. Mild gaseous distension of the partially  visualized colon noted as well. Cholecystectomy clips noted. No acute osseous finding. Degenerative changes noted about the right shoulder. IMPRESSION: 1. Complete opacification of the right hemithorax, favored to in large part reflect effusion with atelectasis. Superimposed airspace disease/consolidation difficult to exclude, and could be considered in the correct clinical setting. 2. Patchy opacity at the medial left lung base, which could reflect atelectasis or infiltrate. Electronically Signed   By: Jeannine Boga M.D.   On: 08-25-2020 00:43   Labs on Admission: I have personally reviewed following labs  CBC: Recent Labs  Lab 07/26/20 0056 07/27/20 0302 08/25/2020 0018  WBC 7.8 7.9 17.6*  NEUTROABS  --   --  10.8*  HGB 8.2* 8.7* 10.2*  HCT 27.6* 30.3* 36.8*  MCV 91.4 94.1 97.6  PLT 264 265 751   Basic Metabolic Panel: Recent Labs  Lab 07/26/20 0056 07/27/20 0302 07/27/20 1727 07/28/20 1320 Aug 25, 2020 0018  NA 138 139 141 141 142  K 4.9 4.8 4.5 4.7 4.4  CL 97* 97* 97* 95* 96*  CO2 35* 35* 36* 37* 40*  GLUCOSE 133* 189* 142* 240* 236*  BUN 34* 34* 30* 27* 19  CREATININE 1.82* 1.72* 1.62* 1.48* 1.11  CALCIUM 8.1* 8.5* 8.6* 8.5* 8.9  MG  --   --  1.9 1.8  --  GFR: Estimated Creatinine Clearance: 78.6 mL/min (by C-G formula based on SCr of 1.11 mg/dL).  Liver Function Tests: Recent Labs  Lab 07/27/20 0302 2020-08-15 0018  AST 20 50*  ALT 30 42  ALKPHOS 75 104  BILITOT 0.6 0.9  PROT 5.1* 6.2*  ALBUMIN 1.9* 2.3*   Coagulation Profile: Recent Labs  Lab 15-Aug-2020 0114  INR 1.3*   CBG: Recent Labs  Lab 08/27/2020 0613 08/23/2020 1116 07/30/2020 1809 08/16/2020 2356 15-Aug-2020 0023  GLUCAP 189* 208* 172* 215* 202*   Urine analysis:    Component Value Date/Time   COLORURINE AMBER (A) 04/28/2020 2016   APPEARANCEUR CLOUDY (A) 04/28/2020 2016   LABSPEC 1.020 04/28/2020 2016   PHURINE 5.0 04/28/2020 2016   GLUCOSEU NEGATIVE 04/28/2020 2016   HGBUR NEGATIVE  04/28/2020 2016   BILIRUBINUR NEGATIVE 04/28/2020 2016   [place] NEGATIVE 04/28/2020 2016   PROTEINUR 100 (A) 04/28/2020 2016   UROBILINOGEN 0.2 11/05/2014 1712   NITRITE NEGATIVE 04/28/2020 2016   LEUKOCYTESUR LARGE (A) 04/28/2020 2016   Hanako Tipping N Arelyn Gauer D.O. [facility]  If 7PM-7AM, please contact overnight-coverage provider If 7AM-7PM, please contact day coverage provider [email or web address]  2020/08/15, 6:19 AM

## 2020-08-29 NOTE — Death Summary Note (Signed)
[facility] Death Note                                                                                                                                                                                               Jay Holloway, is a 75 y.o. male, DOB - April 30, 1946, [id number]  Admit date - 08/21/2020   Admitting Physician Amy Elmer Ramp, DO  Outpatient Primary MD for the patient is Pearson Grippe, MD  LOS - 0  Chief Complaint  Patient presents with  . Cardiac Arrest       Notification: Pearson Grippe, MD notified of death of 2020/08/12   Date and Time of Death - 08/12/20 7:45 AM  Pronounced by - Harlin Heys RN and Jacqualine Code RN  History of present illness:   [name] is a 75 y.o. 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 Subsequent work up revealed aspiration pneumonia causing severe sepsis secondary to dysphagia. Patient was seen by speech and note to be at high risk for recurrent aspiration events. Patient was seen by Palliative care consultation and after extensive discussions with nephew, it was decided that patient was to be partial code and to be followed by Hospice after discharge to his SNF. Patient was discharged back to SNF yesterday but was found to be in respiratory distress after transfer. Patient was resuscitated and brought to ED last night. Discussions with nephew regarding likely re-aspiration event resulted in patient being made DNR with comfort measures only. Nephew visiting patient this morning during which time the patient expired.    Final Diagnoses:  Cause if death: Respiratory Arrest likely secondary to re-aspiration event.   Signature  Pieter Partridge M.D on August 12, 2020 at 8:34 AM  [facility]  Office Phone -[phone]  Total clinical and documentation time  for today Under 30 minutes   Last Note

## 2020-08-29 NOTE — Progress Notes (Signed)
Received report from ED RN and patient arrived at [phone] unresponsive and on re-breather mask. Nephew of patient accompanied patient.

## 2020-08-29 NOTE — ED Provider Notes (Signed)
[name] EMERGENCY DEPARTMENT Provider Note   CSN: [id number] Arrival date & time: August 11, 2020  2346     History Chief Complaint  Patient presents with  . Cardiac Arrest    Jay Holloway is a 75 y.o. male.  74 year old male with multimedical problems documented below who presents to the emergency department via EMS secondary to cardiac arrest.  Reportedly patient was discharged from our facility earlier in the day where he had been admitted for about a week for similar episode of unresponsiveness.  That time it sounds like he did better was treated for pneumonia and subsequently diuresed from fluid overload that was likely iatrogenic.  Patient was sent to the facility and before they could even get him in the system they went to check on him and he was reportedly pulseless and apneic.  They did CPR.  EMS was called.  On EMS arrival patient was still altered and GCS of 3 but had pulses.  Was brought here in that condition.  He is reportedly DNR/DNI however the paperwork is left at the facility. I discussed with the nephew to verify CODE STATUS and he verified the patient would not want to be on life support.     Cardiac Arrest      Past Medical History:  Diagnosis Date  . Arthritis    trouble turning head side to side and up  . Diabetes mellitus without complication (HCC)   . Hemorrhoids   . High cholesterol   . Hypertension   . Prostate cancer [facility])     Patient Active Problem List   Diagnosis Date Noted  . VT (ventricular tachycardia) (HCC)   . Palliative care encounter   . Severe protein-calorie malnutrition (HCC) 07/23/2020  . HCAP (healthcare-associated pneumonia) 07/22/2020  . Chronic diastolic CHF (congestive heart failure) (HCC) 07/22/2020  . Type II diabetes mellitus with renal manifestations (HCC) 07/22/2020  . Sepsis (HCC) 07/22/2020  . Decubital ulcer 07/22/2020  . Unresponsiveness 07/22/2020  . HLD (hyperlipidemia) 07/22/2020  . CKD (chronic  kidney disease), stage IIIa 07/22/2020  . Aspiration pneumonia (HCC) 07/22/2020  . PE (pulmonary thromboembolism) (HCC) 07/22/2020  . VTE (venous thromboembolism) 07/03/2020  . At risk for obstructive sleep apnea 07/03/2020  . Former smoker 07/03/2020  . Shortness of breath 07/03/2020  . Acute respiratory failure with hypoxia and hypercapnia (HCC) 05/10/2020  . Acute respiratory failure (HCC) 05/10/2020  . Hypomagnesemia 04/28/2020  . Mixed diabetic hyperlipidemia associated with type 2 diabetes mellitus (HCC) 04/28/2020  . Pressure injury of sacral region, stage 2 (HCC) 04/19/2020  . Acute diastolic CHF (congestive heart failure) (HCC) 04/19/2020  . Acute respiratory failure with hypoxia and hypercarbia (HCC) 04/17/2020  . Hypernatremia 04/17/2020  . Dysphagia 04/17/2020  . Acute metabolic encephalopathy 04/17/2020  . Hyperkalemia 04/14/2020  . GERD without esophagitis 04/14/2020  . Slurred speech 04/14/2020  . Generalized weakness 04/14/2020  . AKI (acute kidney injury) (HCC) 03/21/2020  . Prostate cancer (HCC)   . Essential hypertension   . Arterial hypotension   . Renal failure   . Type 2 diabetes mellitus with hyperglycemia, with long-term current use of insulin (HCC)   . Weakness   . DKA (diabetic ketoacidoses) 11/05/2014    Past Surgical History:  Procedure Laterality Date  . CHOLECYSTECTOMY N/A 11/11/2014   Procedure: LAPAROSCOPIC CHOLECYSTECTOMY WITH INTRAOPERATIVE CHOLANGIOGRAM;  Surgeon: Harriette Bouillon, MD;  Location: [facility] OR;  Service: General;  Laterality: N/A;  . COLONOSCOPY N/A 11/09/2014   Procedure: COLONOSCOPY;  Surgeon: Charna Elizabeth,  MD;  Location: [place] ENDOSCOPY;  Service: Endoscopy;  Laterality: N/A;  . CYSTOSCOPY WITH RETROGRADE PYELOGRAM, URETEROSCOPY AND STENT PLACEMENT Left 04/18/2014   Procedure: CYSTOSCOPY WITH RETROGRADE PYELOGRAM, URETEROSCOPY AND STENT PLACEMENT;  Surgeon: Raynelle Bring, MD;  Location: [facility] ORS;  Service: Urology;  Laterality: Left;  .  HOLMIUM LASER APPLICATION Left [id number]   Procedure: HOLMIUM LASER APPLICATION;  Surgeon: Raynelle Bring, MD;  Location: [facility] ORS;  Service: Urology;  Laterality: Left;  . LYMPHADENECTOMY  06/04/2012   Procedure: LYMPHADENECTOMY;  Surgeon: Dutch Gray, MD;  Location: [facility] ORS;  Service: Urology;  Laterality: Bilateral;  . right  achilles tendon repair  yrs ago  . ROBOT ASSISTED LAPAROSCOPIC RADICAL PROSTATECTOMY  06/04/2012   Procedure: ROBOTIC ASSISTED LAPAROSCOPIC RADICAL PROSTATECTOMY LEVEL 3;  Surgeon: Dutch Gray, MD;  Location: [facility] ORS;  Service: Urology;  Laterality: N/A;       Family History  Family history unknown: Yes    Social History   Tobacco Use  . Smoking status: Former Smoker    Packs/day: 0.25    Years: 4.00    Pack years: 1.00    Types: Cigarettes    Quit date: 12/16/1978    Years since quitting: 41.6  . Smokeless tobacco: Never Used  Substance Use Topics  . Alcohol use: Not Currently    Comment: remote h/o heavy use  . Drug use: Not Currently    Types: Marijuana    Comment: 1970s    Home Medications Prior to Admission medications   Medication Sig Start Date End Date Taking? Authorizing Provider  acetaminophen (TYLENOL) 325 MG tablet Take 2 tablets (650 mg total) by mouth every 6 (six) hours as needed for mild pain, moderate pain or headache. 05/18/20   Hongalgi, Lenis Dickinson, MD  apixaban (ELIQUIS) 5 MG TABS tablet Take 5 mg by mouth 2 (two) times daily.    [provider]  atorvastatin (LIPITOR) 10 MG tablet Take 10 mg by mouth daily.  04/21/18   [provider]  Cholecalciferol (VITAMIN D3) 25 MCG (1000 UT) CAPS Take 1,000 Units by mouth daily.  10/15/18   [provider]  collagenase (SANTYL) ointment Apply topically daily. 07/28/20   Annita Brod, MD  diltiazem (CARDIZEM) 60 MG tablet Take 1 tablet (60 mg total) by mouth 3 (three) times daily. 05/18/20   Hongalgi, Lenis Dickinson, MD  feeding supplement (ENSURE ENLIVE / ENSURE PLUS) LIQD Take  237 mLs by mouth 2 (two) times daily between meals. 05/18/20   Hongalgi, Lenis Dickinson, MD  furosemide (LASIX) 40 MG tablet Take 0.5 tablets (20 mg total) by mouth daily. fluid Patient taking differently: Take 20 mg by mouth daily. 03/24/20   Swayze, Ava, DO  gabapentin (NEURONTIN) 100 MG capsule Take 100 mg by mouth 3 (three) times daily. 07/13/20   [provider]  insulin glargine (LANTUS) 100 UNIT/ML injection Inject 0.07 mLs (7 Units total) into the skin daily. 07/28/20   Annita Brod, MD  levalbuterol (XOPENEX) 1.25 MG/0.5ML nebulizer solution Take 0.63 mg by nebulization every 6 (six) hours as needed for wheezing or shortness of breath. 05/18/20   Hongalgi, Lenis Dickinson, MD  magnesium oxide (MAG-OX) 400 MG tablet Take 400 mg by mouth daily.    [provider]  Multiple Vitamins-Minerals (MULTIVITAMIN ADULTS) TABS Take 1 tablet by mouth daily.    [provider]  Nutritional Supplements (,FEEDING SUPPLEMENT, PROSOURCE PLUS) liquid Take 30 mLs by mouth 3 (three) times daily between meals. 08/07/2020 08/30/20  Bonnell Public  Tublu, MD  ondansetron (ZOFRAN) 4 MG tablet Take 4 mg by mouth every 8 (eight) hours as needed for nausea or vomiting.    [provider]  pantoprazole (PROTONIX) 40 MG tablet Take 1 tablet (40 mg total) by mouth 2 (two) times daily. 03/24/20   Swayze, Ava, DO  Pollen Extracts (PROSTAT PO) Take 30 mLs by mouth 2 (two) times daily.    [provider]  Semaglutide (OZEMPIC, 1 MG/DOSE, [place]) Inject 1 mg into the skin every Wednesday.    [provider]    Allergies    Patient has no known allergies.  Review of Systems   Review of Systems  Unable to perform ROS: Patient unresponsive    Physical Exam Updated Vital Signs BP 107/71   Pulse 81   Temp 97.9 F (36.6 C) (Axillary)   Resp (!) 22   SpO2 98%   Physical Exam Vitals and nursing note reviewed.  Constitutional:      Appearance: He is well-developed and  well-nourished.  HENT:     Head: Normocephalic and atraumatic.     Mouth/Throat:     Mouth: Mucous membranes are moist.     Pharynx: Oropharynx is clear.  Eyes:     Pupils: Pupils are equal, round, and reactive to light.  Cardiovascular:     Rate and Rhythm: Normal rate.  Pulmonary:     Effort: No respiratory distress.     Comments: Minimal respiratory effort Abdominal:     General: There is no distension.  Musculoskeletal:        General: Normal range of motion.     Cervical back: Normal range of motion.  Skin:    General: Skin is warm and dry.  Neurological:     General: No focal deficit present.     Mental Status: He is alert.     ED Results / Procedures / Treatments   Labs (all labs ordered are listed, but only abnormal results are displayed) Labs Reviewed  CBC WITH DIFFERENTIAL/PLATELET - Abnormal; Notable for the following components:      Result Value   WBC 17.6 (*)    RBC 3.77 (*)    Hemoglobin 10.2 (*)    HCT 36.8 (*)    MCHC 27.7 (*)    RDW 15.7 (*)    Neutro Abs 10.8 (*)    Lymphs Abs 5.6 (*)    Abs Immature Granulocytes 0.16 (*)    All other components within normal limits  COMPREHENSIVE METABOLIC PANEL - Abnormal; Notable for the following components:   Chloride 96 (*)    CO2 40 (*)    Glucose, Bld 236 (*)    Total Protein 6.2 (*)    Albumin 2.3 (*)    AST 50 (*)    All other components within normal limits  LACTIC ACID, PLASMA - Abnormal; Notable for the following components:   Lactic Acid, Venous 2.0 (*)    All other components within normal limits  PROTIME-INR - Abnormal; Notable for the following components:   Prothrombin Time 15.3 (*)    INR 1.3 (*)    All other components within normal limits  CBG MONITORING, ED - Abnormal; Notable for the following components:   Glucose-Capillary 215 (*)    All other components within normal limits  CBG MONITORING, ED - Abnormal; Notable for the following components:   Glucose-Capillary 202 (*)    All  other components within normal limits  LACTIC ACID, PLASMA  URINALYSIS, ROUTINE W REFLEX  MICROSCOPIC  I-STAT VENOUS BLOOD GAS, ED    EKG EKG Interpretation  Date/Time:  Monday July 31 2020 23:59:09 EST Ventricular Rate:  95 PR Interval:    QRS Duration: 136 QT Interval:  366 QTC Calculation: 461 R Axis:   -61 Text Interpretation: Sinus rhythm Prolonged PR interval RBBB and LAFB Abnormal lateral Q waves Confirmed by Merrily Pew [phone] on August 25, 2020 12:10:23 AM   Radiology CT Chest W Contrast  Result Date: 2020/08/25 CLINICAL DATA:  Prostate cancer, hypertension, diabetes, cardiopulmonary arrest EXAM: CT CHEST WITH CONTRAST TECHNIQUE: Multidetector CT imaging of the chest was performed during intravenous contrast administration. CONTRAST:  41mL OMNIPAQUE IOHEXOL 300 MG/ML  SOLN COMPARISON:  02/20/2020 FINDINGS: Cardiovascular: Mild coronary artery calcification. Global cardiac size within normal limits. No pericardial effusion. Central pulmonary arteries are enlarged in keeping with pulmonary arterial hypertension, similar to that noted on prior examination. Mild atherosclerotic calcification within the thoracic aorta. No aortic aneurysm. Mediastinum/Nodes: There is mild mediastinal shift to the right, secondary to volume loss new since prior examination. No pathologic thoracic adenopathy. The esophagus is patulous, nonspecific. Lungs/Pleura: There is dense consolidation and collapse of the entire right lung, with right-sided volume loss and mediastinal shift to the right as described above. There is extensive filling defect filling the airways of the right lung extending to the right mainstem bronchus, likely representing debris and/or mucous. Moderate right pleural effusion has developed in the interval since prior examination. There is progressive dense consolidation within the left lower lobe with several cystic lucencies now evident in keeping with necrosis and resultant cavitation. No  central obstructing lesion on the left. No pneumothorax. Upper Abdomen: Limited images of the upper abdomen demonstrate bilateral hydronephrosis, new since prior CT examination of 04/28/2020. Several nonobstructing calculi are seen within the lower pole of the left kidney measuring up to 4 mm. The dome of the distended bladder is partially visualized at the inferior margin of the examination. Cholecystectomy has been performed. The stomach is distended with gas and fluid, similar to that noted on prior examination. There is extensive diffuse subcutaneous edema involving the body wall, likely reflecting changes of anasarca. Musculoskeletal: Degenerative changes are seen within the a thoracolumbar spine. No acute bone abnormality. IMPRESSION: Interval development of dense collapse and consolidation of the entire right lung with extensive filling defect occluding the central airways of the right lung extending to the right mainstem bronchus. Interval development of moderate right parapneumonic effusion. Progressive consolidation and developing cavitation within the left lower lobe in keeping with progressive necrotizing pneumonia. Gaseous distension of the stomach, potentially contributing to elevation of the hemidiaphragms. Nasogastric intubation should be considered. Moderate bilateral hydronephrosis with probable bladder distension. This is not fully assessed on this examination. However, Foley catheterization may be helpful for decompression. Additional incidental findings as noted above. Aortic Atherosclerosis (ICD10-I70.0). Electronically Signed   By: Fidela Salisbury MD   On: August 25, 2020 04:37   DG Chest Portable 1 View  Result Date: 08-25-20 CLINICAL DATA:  Initial evaluation for unresponsiveness, found down, pulseless and apneic. Status post CPR. EXAM: PORTABLE CHEST 1 VIEW COMPARISON:  Prior radiograph from 07/22/2020 FINDINGS: Cardiac and mediastinal silhouettes are grossly stable, although partially  obscured. Tracheal air column deviated to the right. Lungs are hypoinflated. Complete opacification of the right hemithorax, favored to in large part reflect effusion with atelectasis. Superimposed consolidative airspace disease difficult to exclude. Patchy opacity at the medial left lung base could reflect atelectasis or infiltrate. Left lung otherwise largely clear. No pulmonary edema.  No visible pneumothorax. Prominent gaseous distension of the stomach noted at the upper abdomen. Mild gaseous distension of the partially visualized colon noted as well. Cholecystectomy clips noted. No acute osseous finding. Degenerative changes noted about the right shoulder. IMPRESSION: 1. Complete opacification of the right hemithorax, favored to in large part reflect effusion with atelectasis. Superimposed airspace disease/consolidation difficult to exclude, and could be considered in the correct clinical setting. 2. Patchy opacity at the medial left lung base, which could reflect atelectasis or infiltrate. Electronically Signed   By: Jeannine Boga M.D.   On: 2020/08/22 00:43    Procedures .Critical Care Performed by: Merrily Pew, MD Authorized by: Merrily Pew, MD   Critical care provider statement:    Critical care time (minutes):  45   Critical care was necessary to treat or prevent imminent or life-threatening deterioration of the following conditions:  Respiratory failure and sepsis   Critical care was time spent personally by me on the following activities:  Discussions with consultants, evaluation of patient's response to treatment, examination of patient, ordering and performing treatments and interventions, ordering and review of laboratory studies, ordering and review of radiographic studies, pulse oximetry, re-evaluation of patient's condition, obtaining history from patient or surrogate and review of old charts   (including critical care time)  Medications Ordered in ED Medications   vancomycin (VANCOREADY) IVPB 1500 mg/300 mL (0 mg Intravenous Stopped 2020/08/22 0415)  ceFEPIme (MAXIPIME) 2 g in sodium chloride 0.9 % 100 mL IVPB (0 g Intravenous Stopped August 22, 2020 0143)  iohexol (OMNIPAQUE) 300 MG/ML solution 75 mL (75 mLs Intravenous Contrast Given Aug 22, 2020 0420)    ED Course  I have reviewed the triage vital signs and the nursing notes.  Pertinent labs & imaging results that were available during my care of the patient were reviewed by me and considered in my medical decision making (see chart for details).    MDM Rules/Calculators/A&P                          We will apply nonrebreather.  With a history of aspiration pneumonia and his altered mental status at this point is contraindicated for BiPAP.  Discussion with the nephew and we will support the patient and do minimal work-up at this time.  Patient's nephew verified the patient's DNR/DNI status.  We will do this initial work-up and then have further discussions with the nephew on goals of care. With new CT findings of worsening right-sided consolidation and parapneumonic effusion, worsening left lower lobe necrotizing pneumonia I discussed with the nephew that this was likely an end-of-life deal and which are probably patient comfort care.  He was on board with that.  He states that he had these discussions with palliative care in the past and understands this.  He states he probably would not want to be wearing a mask as he found it uncomfortable previously.  I talked to him and said that if we took off the mask is very likely that he could expire pretty quickly the request that we keep it on until he can come.  He said he will be around 7 or 8 in the morning.  There is no one else he wants me to inform at this time he said he would take care of it.  At this time patient is on BiPAP, unchanged responsiveness.  We will talked with hospitalist about admission for palliative care and comfort care after nephew arrives.  Final  Clinical  Impression(s) / ED Diagnoses Final diagnoses:  Respiratory arrest ([place])  Hypoxia  Recurrent pneumonia    Rx / DC Orders ED Discharge Orders    None       Makynna Manocchio, Corene Cornea, MD 08/03/2020 0501

## 2020-08-29 NOTE — ED Notes (Signed)
Attempted report 

## 2020-08-29 DEATH — deceased

## 2022-02-17 IMAGING — CT CT HEAD W/O CM
4 series · 16 of 47 positions shown, 18 images · non-contrast
Comparison: None.

CLINICAL DATA: Altered mental status. Generalized weakness for
weeks. Slurred speech. Slipped from chair.

EXAM:
CT HEAD WITHOUT CONTRAST
TECHNIQUE: Contiguous axial images were obtained from the base of the skull
through the vertex without intravenous contrast.

[Series 3: head wo · axial · 0.44mm/px · z∈[+970,+1095]mm · 7 of 35 slices shown, 9 images]
[im 5/35  brain]
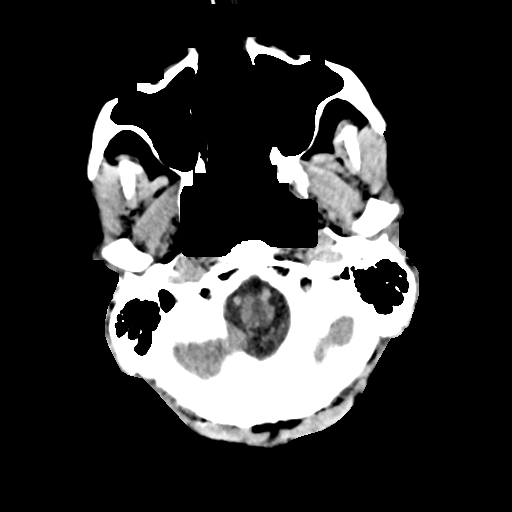
[im 5/35  bone]
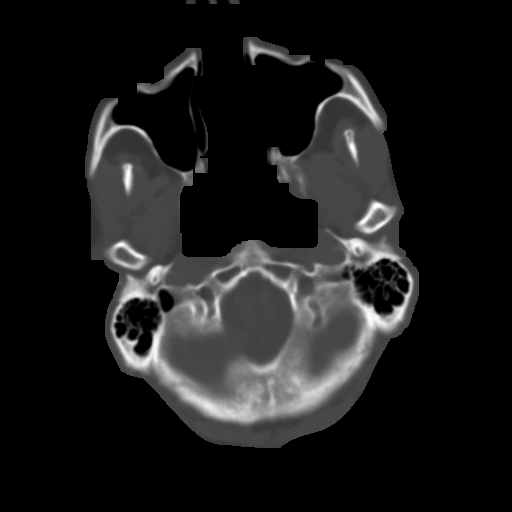
[im 9/35  brain]
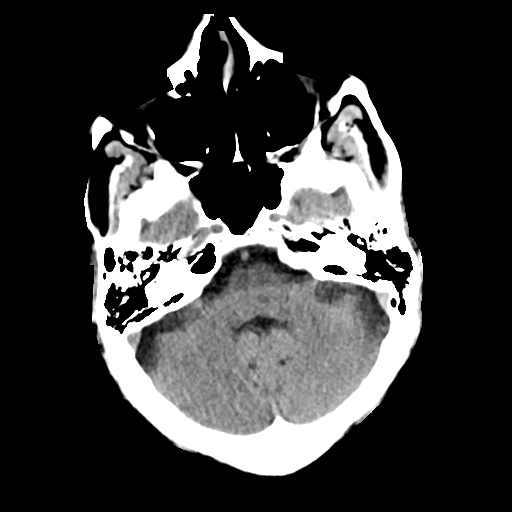
[im 13/35  brain]
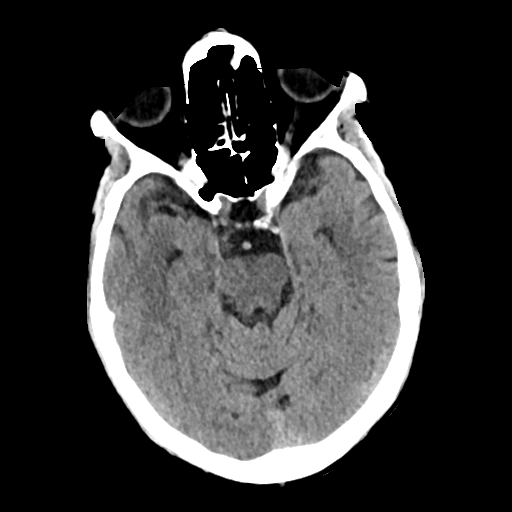
[im 18/35  brain]
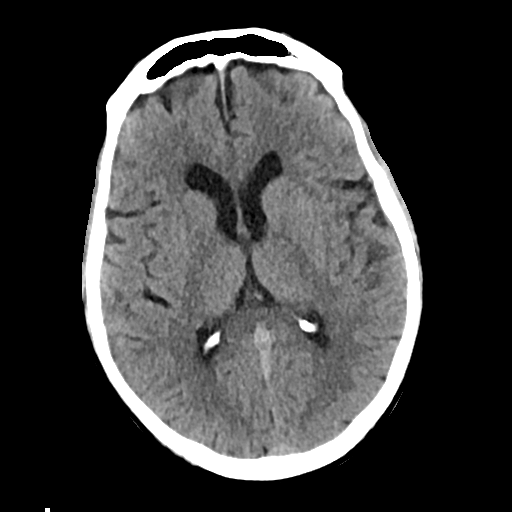
[im 22/35  brain]
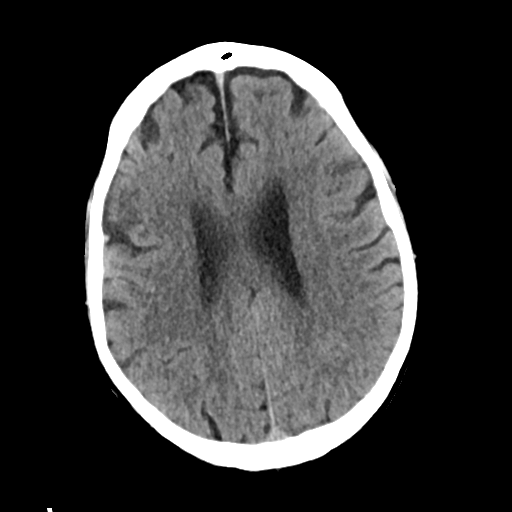
[im 22/35  bone]
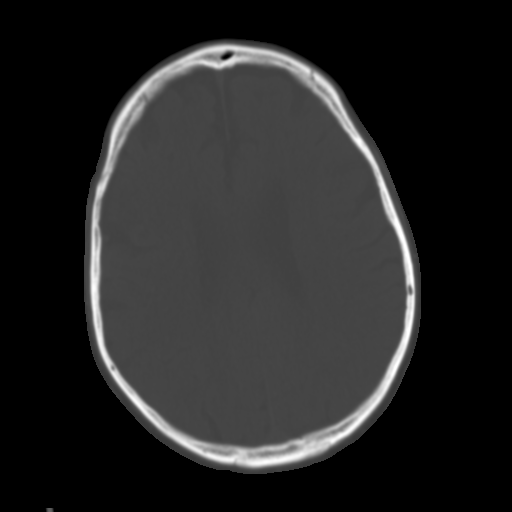
[im 26/35  brain]
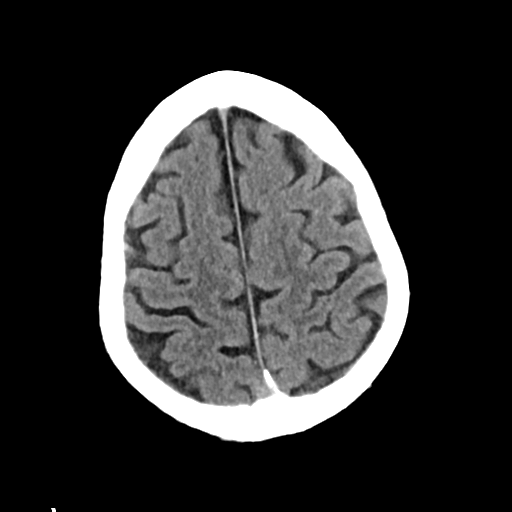
[im 30/35  brain]
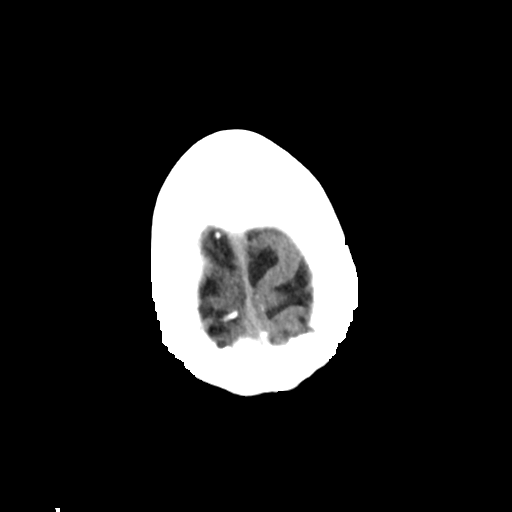

[Series 4: head bone · axial · 0.44mm/px · z∈[+966,+1000]mm · 3 of 87 slices shown]
[im 9/87  bone]
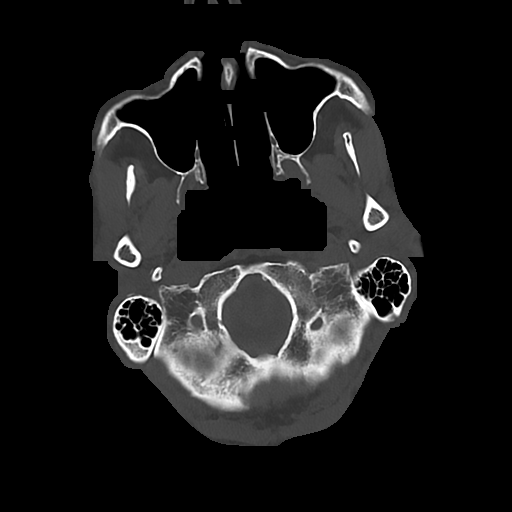
[im 18/87  bone]
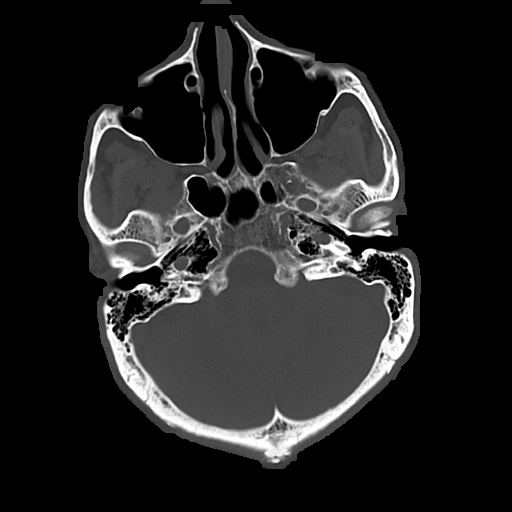
[im 26/87  bone]
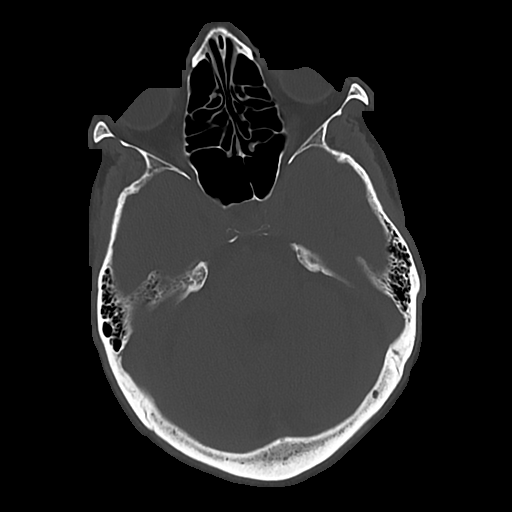

[Series 5: cor soft · coronal · 0.34mm/px · 3 of 76 slices shown]
[im 26/76  brain]
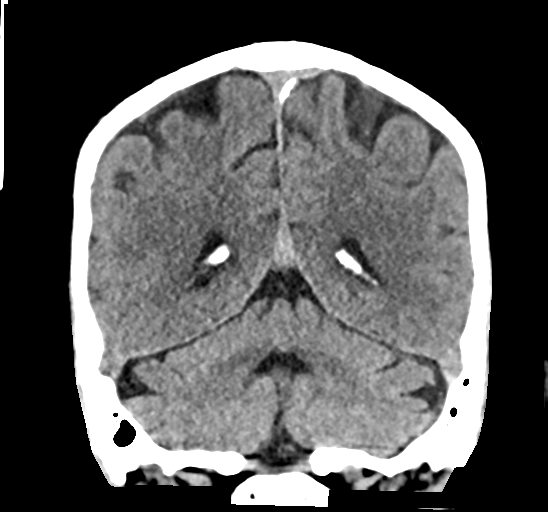
[im 34/76  brain]
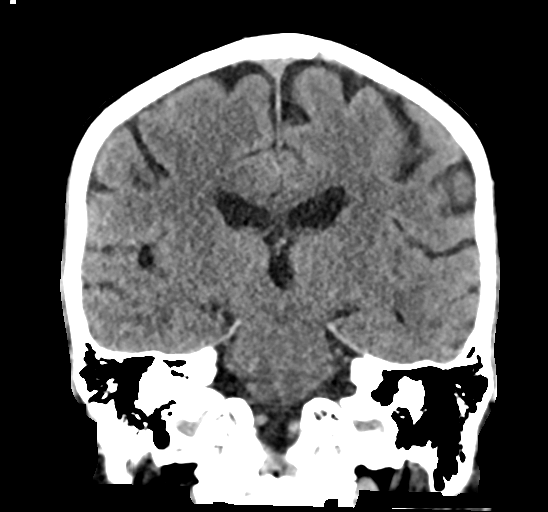
[im 42/76  brain]
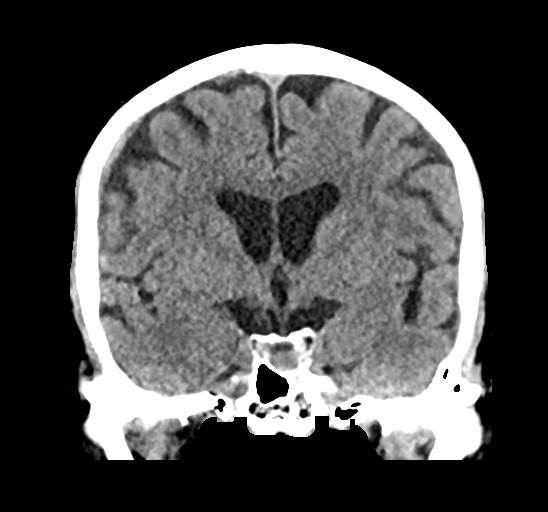

[Series 6: sag soft · sagittal · 0.34mm/px · 3 of 62 slices shown]
[im 21/62  brain]
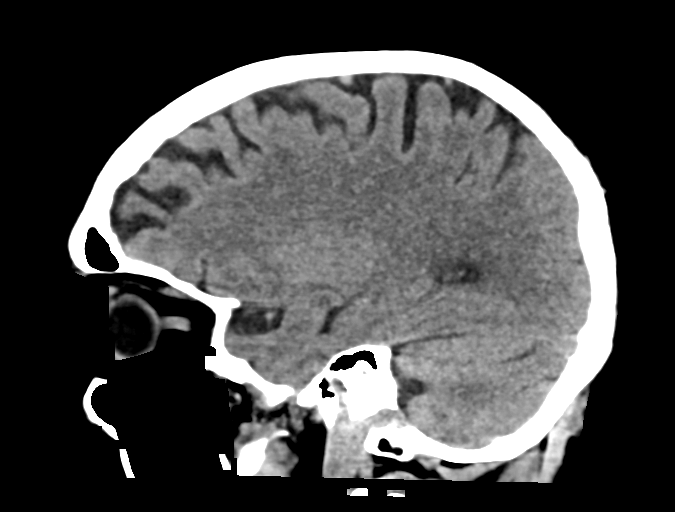
[im 31/62  brain]
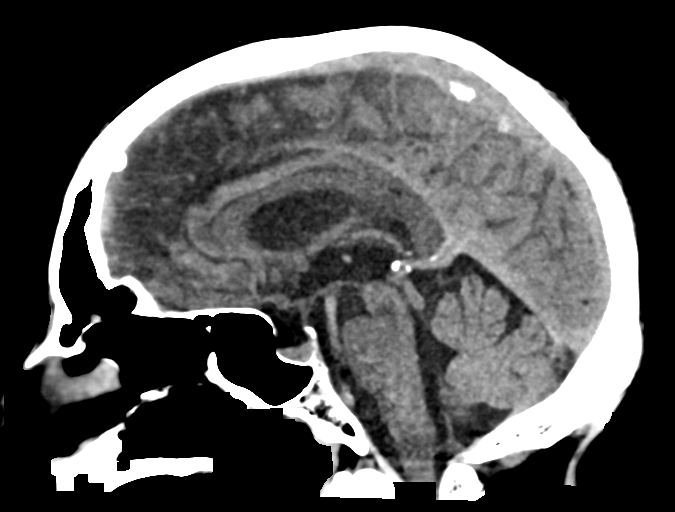
[im 41/62  brain]
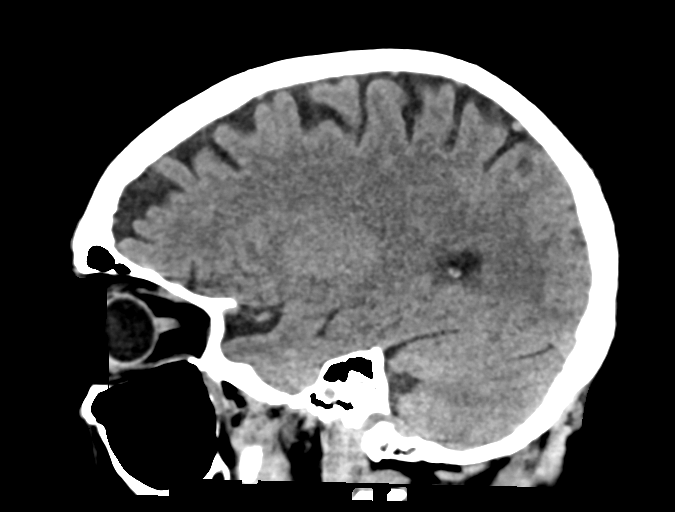

[16 of 47 positions shown; findings below may reference images not displayed]

FINDINGS: Brain: No evidence of parenchymal hemorrhage or extra-axial fluid
collection. No mass lesion, mass effect, or midline shift. No CT
evidence of acute infarction. Nonspecific mild subcortical and
periventricular white matter hypodensity, most in keeping with
chronic small vessel ischemic change. Cerebral volume is age
appropriate. No ventriculomegaly.

Vascular: No acute abnormality.

Skull: No evidence of calvarial fracture.

Sinuses/Orbits: The visualized paranasal sinuses are essentially
clear.

Other:  The mastoid air cells are unopacified.
IMPRESSION: 1. No evidence of acute intracranial abnormality. No evidence of
calvarial fracture.
2. Mild chronic small vessel ischemic changes in the cerebral white
matter.

## 2022-02-17 IMAGING — DX DG CHEST 1V PORT
1 series · 1 of 1 positions shown · non-contrast
Comparison: 03/21/2020

CLINICAL DATA: Weakness.

EXAM:
PORTABLE CHEST 1 VIEW

[chest]
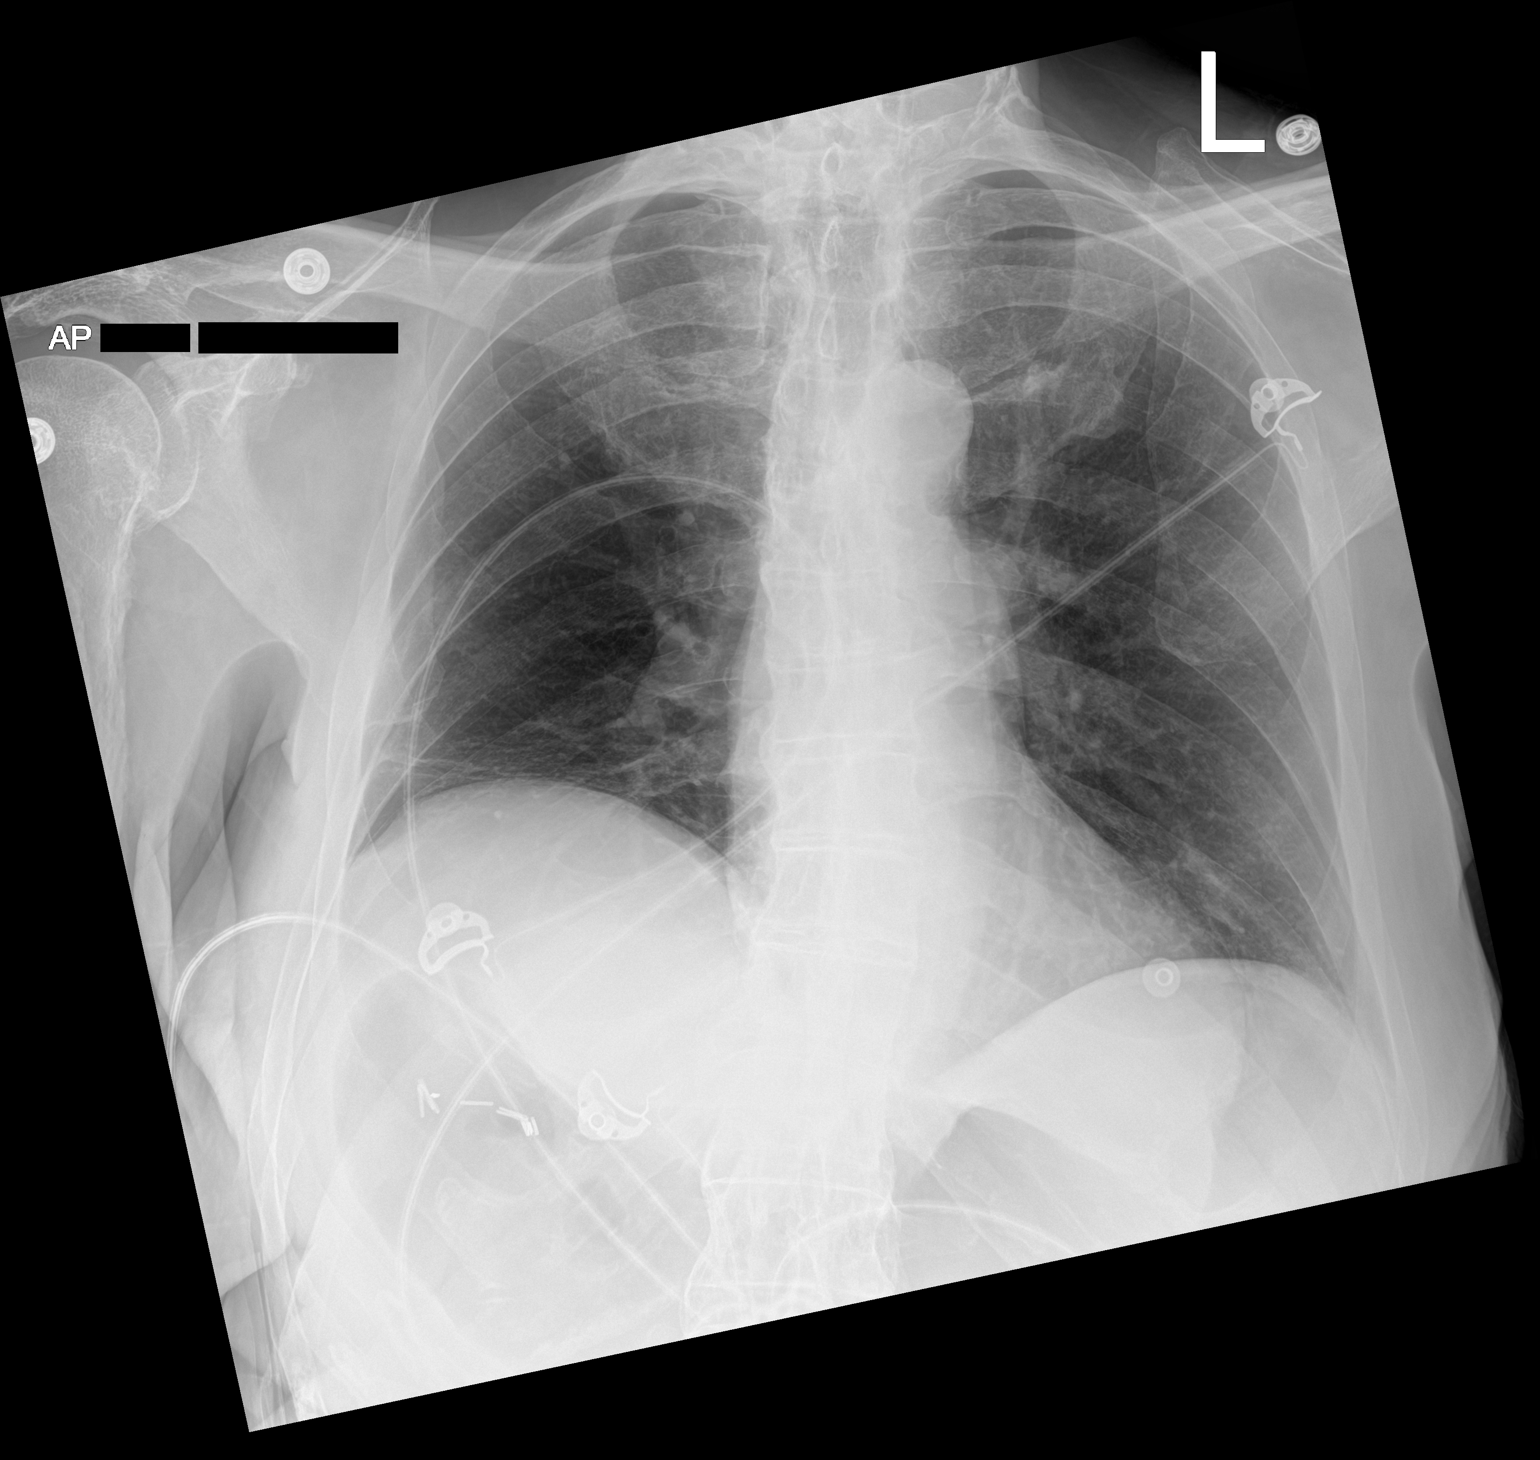

[1 of 1 positions shown; findings below may reference images not displayed]

FINDINGS: Cardiac silhouette is normal in size. No mediastinal or hilar
masses.

Linear opacities noted at the right lung base consistent with
scarring or atelectasis, stable. Lungs otherwise clear.

No pleural effusion or pneumothorax.

Skeletal structures are grossly intact.
IMPRESSION: No active disease.
